# Patient Record
Sex: Male | Born: 1950 | Race: White | Hispanic: No | Smoking: Light tobacco smoker
Health system: Southern US, Community
[De-identification: ages and names within clinical notes are randomized; demographics above are authoritative.]

## PROBLEM LIST (undated history)

## (undated) DIAGNOSIS — J9601 Acute respiratory failure with hypoxia: Secondary | ICD-10-CM

## (undated) DIAGNOSIS — I6529 Occlusion and stenosis of unspecified carotid artery: Secondary | ICD-10-CM

## (undated) DIAGNOSIS — I10 Essential (primary) hypertension: Secondary | ICD-10-CM

## (undated) HISTORY — DX: Occlusion and stenosis of unspecified carotid artery: I65.29

## (undated) HISTORY — DX: Essential (primary) hypertension: I10

---
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
## 2013-11-27 ENCOUNTER — Telehealth: Payer: Self-pay | Admitting: *Deleted

## 2013-11-27 NOTE — Telephone Encounter (Signed)
Pt was calling in regards to going to his dentis this morning and he was unable to get his tooth extracted because his BP was high. He is out of his Metoprolol and he needs to have it refilled. His next appointment is June 2nd.   JB  Dentist Felipa Furnace [facility] Dr. Ginette Otto) he didn't know the number (open back up at 2:00)

## 2013-11-27 NOTE — Telephone Encounter (Signed)
Returned call and pt verified x 2.  Pt informed message received and we cannot refill any meds for him.  Informed it has >2 years since he was seen and we have no records on him in the office.  Pt advised to go to PCP or Urgent Care for evaluation and script.  Informed no refills can be given until he is seen in the office.  Pt verbalized understanding and agreed w/ plan.

## 2013-11-28 ENCOUNTER — Emergency Department (HOSPITAL_COMMUNITY)
Admission: EM | Admit: 2013-11-28 | Discharge: 2013-11-28 | Disposition: A | Discharge status: Home / Self Care | Payer: No Typology Code available for payment source | Source: Home / Self Care | Attending: Family Medicine | Admitting: Family Medicine

## 2013-11-28 ENCOUNTER — Encounter (HOSPITAL_COMMUNITY): Payer: Self-pay | Admitting: Emergency Medicine

## 2013-11-28 DIAGNOSIS — I10 Essential (primary) hypertension: Secondary | ICD-10-CM

## 2013-11-28 LAB — POCT I-STAT, CHEM 8
BUN: 12 mg/dL (ref 6–23)
CHLORIDE: 102 meq/L (ref 96–112)
Calcium, Ion: 1.22 mmol/L (ref 1.13–1.30)
Creatinine, Ser: 1.2 mg/dL (ref 0.50–1.35)
GLUCOSE: 82 mg/dL (ref 70–99)
HCT: 58 % — ABNORMAL HIGH (ref 39.0–52.0)
HEMOGLOBIN: 19.7 g/dL — AB (ref 13.0–17.0)
POTASSIUM: 4.2 meq/L (ref 3.7–5.3)
Sodium: 138 mEq/L (ref 137–147)
TCO2: 27 mmol/L (ref 0–100)

## 2013-11-28 MED ORDER — LISINOPRIL 20 MG PO TABS: 20.0000 mg | ORAL_TABLET | Freq: Every day | ORAL | Status: AC

## 2013-11-28 NOTE — ED Provider Notes (Signed)
CSN: [id number]     Arrival date & time 11/28/13  1305 History   First MD Initiated Contact with Patient 11/28/13 1404     Chief Complaint  Patient presents with  . Hypertension   (Consider location/radiation/quality/duration/timing/severity/associated sxs/prior Treatment) HPI Comments: 63 year old male presents complaining of high blood pressure. He was going to get his tooth for the dentist and he was found to have a blood pressure with a systolic over 200 and diastolic over 100. At home, his blood pressure readings are regularly 170-180/90-100. He used to be on blood pressure medicine but stopped it. He denies any symptoms at this time. He does not have a primary care doctor.  Patient is a 63 y.o. male presenting with hypertension.  Hypertension Pertinent negatives include no chest pain, no abdominal pain and no shortness of breath.    History reviewed. No pertinent past medical history. History reviewed. No pertinent past surgical history. History reviewed. No pertinent family history. History  Substance Use Topics  . Smoking status: Current Every Day Smoker -- 1.00 packs/day    Types: Cigarettes  . Smokeless tobacco: Not on file  . Alcohol Use: Yes    Review of Systems  Constitutional: Negative for fever, chills and fatigue.  HENT: Negative for sore throat.   Eyes: Negative for visual disturbance.  Respiratory: Negative for cough and shortness of breath.   Cardiovascular: Negative for chest pain, palpitations and leg swelling.  Gastrointestinal: Negative for nausea, vomiting, abdominal pain, diarrhea and constipation.  Genitourinary: Negative for dysuria, urgency, frequency and hematuria.  Musculoskeletal: Negative for arthralgias, myalgias, neck pain and neck stiffness.  Skin: Negative for rash.  Neurological: Negative for dizziness, weakness and light-headedness.  All other systems reviewed and are negative.   Allergies  Review of patient's allergies indicates no known  allergies.  Home Medications   Prior to Admission medications   Not on File   BP 178/98  Pulse 66  Temp(Src) 98.1 F (36.7 C) (Oral)  Resp 18  SpO2 99% Physical Exam  Nursing note and vitals reviewed. Constitutional: He is oriented to person, place, and time. He appears well-developed and well-nourished. No distress.  HENT:  Head: Normocephalic.  Cardiovascular: Normal rate, regular rhythm and normal heart sounds.   Pulmonary/Chest: Effort normal and breath sounds normal. No respiratory distress.  Neurological: He is alert and oriented to person, place, and time. Coordination normal.  Skin: Skin is warm and dry. No rash noted. He is not diaphoretic.  Psychiatric: He has a normal mood and affect. Judgment normal.    ED Course  Procedures (including critical care time) Labs Review Labs Reviewed  POCT I-STAT, CHEM 8 - Abnormal; Notable for the following:    Hemoglobin 19.7 (*)    HCT 58.0 (*)    All other components within normal limits    Imaging Review No results found.   MDM   1. Hypertension    Will start Tx with lisinopril, he will f/u with PCP.  Stop smoking.     Meds ordered this encounter  Medications  . lisinopril (PRINIVIL,ZESTRIL) 20 MG tablet    Sig: Take 1 tablet (20 mg total) by mouth daily.    Dispense:  30 tablet    Refill:  0    Order Specific Question:  Supervising Provider    Answer:  Clementeen Graham, Kathie Rhodes [[id number]]     Graylon Good, PA-C 11/28/13 [phone]

## 2013-11-28 NOTE — ED Notes (Signed)
Reports being sent here by dentist because BP was to high.   Pt states that he has a history of hypertension and high cholesterol but stopped taking meds a couple years ago.  Denies any symptoms.

## 2013-11-28 NOTE — Discharge Instructions (Signed)

## 2013-11-29 NOTE — ED Provider Notes (Signed)
Medical screening examination/treatment/procedure(s) were performed by a resident physician or non-physician practitioner and as the supervising physician I was immediately available for consultation/collaboration.  Carman Auxier, MD    Euclid Cassetta S Clarivel Callaway, MD 11/29/13 0752 

## 2013-12-24 ENCOUNTER — Ambulatory Visit: Payer: Self-pay | Admitting: Cardiovascular Disease

## 2014-11-18 ENCOUNTER — Other Ambulatory Visit: Payer: Self-pay | Admitting: Family Medicine

## 2014-11-18 DIAGNOSIS — Z136 Encounter for screening for cardiovascular disorders: Secondary | ICD-10-CM

## 2014-11-18 DIAGNOSIS — F17200 Nicotine dependence, unspecified, uncomplicated: Secondary | ICD-10-CM

## 2014-11-27 ENCOUNTER — Ambulatory Visit
Admission: RE | Admit: 2014-11-27 | Discharge: 2014-11-27 | Disposition: A | Discharge status: Home / Self Care | Payer: PPO | Source: Ambulatory Visit | Attending: Family Medicine | Admitting: Family Medicine

## 2014-11-27 DIAGNOSIS — F17200 Nicotine dependence, unspecified, uncomplicated: Secondary | ICD-10-CM

## 2014-11-27 DIAGNOSIS — Z136 Encounter for screening for cardiovascular disorders: Secondary | ICD-10-CM

## 2015-09-28 DIAGNOSIS — H25012 Cortical age-related cataract, left eye: Secondary | ICD-10-CM | POA: Diagnosis not present

## 2015-09-28 DIAGNOSIS — Z961 Presence of intraocular lens: Secondary | ICD-10-CM | POA: Diagnosis not present

## 2015-09-28 DIAGNOSIS — H2512 Age-related nuclear cataract, left eye: Secondary | ICD-10-CM | POA: Diagnosis not present

## 2015-09-28 DIAGNOSIS — H25042 Posterior subcapsular polar age-related cataract, left eye: Secondary | ICD-10-CM | POA: Diagnosis not present

## 2015-10-19 DIAGNOSIS — H26491 Other secondary cataract, right eye: Secondary | ICD-10-CM | POA: Diagnosis not present

## 2015-11-03 DIAGNOSIS — H2512 Age-related nuclear cataract, left eye: Secondary | ICD-10-CM | POA: Diagnosis not present

## 2015-11-24 DIAGNOSIS — I10 Essential (primary) hypertension: Secondary | ICD-10-CM | POA: Diagnosis not present

## 2015-11-24 DIAGNOSIS — Z23 Encounter for immunization: Secondary | ICD-10-CM | POA: Diagnosis not present

## 2016-05-26 DIAGNOSIS — Z0000 Encounter for general adult medical examination without abnormal findings: Secondary | ICD-10-CM | POA: Diagnosis not present

## 2016-05-26 DIAGNOSIS — Z1211 Encounter for screening for malignant neoplasm of colon: Secondary | ICD-10-CM | POA: Diagnosis not present

## 2016-05-26 DIAGNOSIS — R350 Frequency of micturition: Secondary | ICD-10-CM | POA: Diagnosis not present

## 2016-05-26 DIAGNOSIS — I10 Essential (primary) hypertension: Secondary | ICD-10-CM | POA: Diagnosis not present

## 2016-05-26 DIAGNOSIS — Z125 Encounter for screening for malignant neoplasm of prostate: Secondary | ICD-10-CM | POA: Diagnosis not present

## 2016-05-26 DIAGNOSIS — Z23 Encounter for immunization: Secondary | ICD-10-CM | POA: Diagnosis not present

## 2016-05-26 DIAGNOSIS — E7800 Pure hypercholesterolemia, unspecified: Secondary | ICD-10-CM | POA: Diagnosis not present

## 2016-06-22 DIAGNOSIS — Z1211 Encounter for screening for malignant neoplasm of colon: Secondary | ICD-10-CM | POA: Diagnosis not present

## 2016-06-23 DIAGNOSIS — H43812 Vitreous degeneration, left eye: Secondary | ICD-10-CM | POA: Diagnosis not present

## 2016-06-23 DIAGNOSIS — H524 Presbyopia: Secondary | ICD-10-CM | POA: Diagnosis not present

## 2016-06-23 DIAGNOSIS — H26492 Other secondary cataract, left eye: Secondary | ICD-10-CM | POA: Diagnosis not present

## 2016-06-23 DIAGNOSIS — Z961 Presence of intraocular lens: Secondary | ICD-10-CM | POA: Diagnosis not present

## 2016-06-28 DIAGNOSIS — E871 Hypo-osmolality and hyponatremia: Secondary | ICD-10-CM | POA: Diagnosis not present

## 2016-11-23 DIAGNOSIS — I10 Essential (primary) hypertension: Secondary | ICD-10-CM | POA: Diagnosis not present

## 2017-06-01 DIAGNOSIS — Z1211 Encounter for screening for malignant neoplasm of colon: Secondary | ICD-10-CM | POA: Diagnosis not present

## 2017-06-01 DIAGNOSIS — Z0000 Encounter for general adult medical examination without abnormal findings: Secondary | ICD-10-CM | POA: Diagnosis not present

## 2017-06-01 DIAGNOSIS — Z125 Encounter for screening for malignant neoplasm of prostate: Secondary | ICD-10-CM | POA: Diagnosis not present

## 2017-06-01 DIAGNOSIS — E7800 Pure hypercholesterolemia, unspecified: Secondary | ICD-10-CM | POA: Diagnosis not present

## 2017-06-01 DIAGNOSIS — Z23 Encounter for immunization: Secondary | ICD-10-CM | POA: Diagnosis not present

## 2017-06-01 DIAGNOSIS — I10 Essential (primary) hypertension: Secondary | ICD-10-CM | POA: Diagnosis not present

## 2017-06-28 DIAGNOSIS — H43812 Vitreous degeneration, left eye: Secondary | ICD-10-CM | POA: Diagnosis not present

## 2017-06-28 DIAGNOSIS — H26492 Other secondary cataract, left eye: Secondary | ICD-10-CM | POA: Diagnosis not present

## 2017-06-28 DIAGNOSIS — H524 Presbyopia: Secondary | ICD-10-CM | POA: Diagnosis not present

## 2017-06-28 DIAGNOSIS — Z961 Presence of intraocular lens: Secondary | ICD-10-CM | POA: Diagnosis not present

## 2017-11-21 ENCOUNTER — Other Ambulatory Visit: Payer: Self-pay | Admitting: Family Medicine

## 2017-11-21 DIAGNOSIS — R0600 Dyspnea, unspecified: Secondary | ICD-10-CM | POA: Diagnosis not present

## 2017-11-21 DIAGNOSIS — R0989 Other specified symptoms and signs involving the circulatory and respiratory systems: Secondary | ICD-10-CM

## 2017-11-23 ENCOUNTER — Ambulatory Visit
Admission: RE | Admit: 2017-11-23 | Discharge: 2017-11-23 | Disposition: A | Discharge status: Home / Self Care | Payer: PPO | Source: Ambulatory Visit | Attending: Family Medicine | Admitting: Family Medicine

## 2017-11-23 DIAGNOSIS — I6523 Occlusion and stenosis of bilateral carotid arteries: Secondary | ICD-10-CM | POA: Diagnosis not present

## 2017-11-23 DIAGNOSIS — R0989 Other specified symptoms and signs involving the circulatory and respiratory systems: Secondary | ICD-10-CM

## 2017-11-29 ENCOUNTER — Other Ambulatory Visit: Payer: Self-pay

## 2017-11-29 ENCOUNTER — Other Ambulatory Visit: Payer: Self-pay | Admitting: Family Medicine

## 2017-11-29 DIAGNOSIS — I10 Essential (primary) hypertension: Secondary | ICD-10-CM | POA: Diagnosis not present

## 2017-11-29 DIAGNOSIS — R0600 Dyspnea, unspecified: Secondary | ICD-10-CM | POA: Diagnosis not present

## 2017-11-29 DIAGNOSIS — R9439 Abnormal result of other cardiovascular function study: Secondary | ICD-10-CM

## 2017-11-29 DIAGNOSIS — I679 Cerebrovascular disease, unspecified: Secondary | ICD-10-CM | POA: Diagnosis not present

## 2017-11-29 DIAGNOSIS — E7800 Pure hypercholesterolemia, unspecified: Secondary | ICD-10-CM | POA: Diagnosis not present

## 2017-12-05 ENCOUNTER — Ambulatory Visit
Admission: RE | Admit: 2017-12-05 | Discharge: 2017-12-05 | Disposition: A | Discharge status: Home / Self Care | Payer: PPO | Source: Ambulatory Visit | Attending: Family Medicine | Admitting: Family Medicine

## 2017-12-05 DIAGNOSIS — R9439 Abnormal result of other cardiovascular function study: Secondary | ICD-10-CM

## 2017-12-05 DIAGNOSIS — I771 Stricture of artery: Secondary | ICD-10-CM | POA: Diagnosis not present

## 2017-12-05 MED ORDER — IOPAMIDOL (ISOVUE-370) INJECTION 76%
75.0000 mL | Freq: Once | INTRAVENOUS | Status: AC | PRN
  Administered 2017-12-05: 75 mL via INTRAVENOUS

## 2018-01-16 ENCOUNTER — Ambulatory Visit (INDEPENDENT_AMBULATORY_CARE_PROVIDER_SITE_OTHER): Payer: PPO | Admitting: Vascular Surgery

## 2018-01-16 ENCOUNTER — Encounter: Payer: Self-pay | Admitting: Vascular Surgery

## 2018-01-16 VITALS — BP 140/78 | HR 76 | Temp 96.9°F | Ht 66.0 in | Wt 156.6 lb

## 2018-01-16 DIAGNOSIS — I771 Stricture of artery: Secondary | ICD-10-CM | POA: Diagnosis not present

## 2018-01-16 NOTE — Progress Notes (Signed)
Vascular and Vein Specialist of [facility]  Patient name: Raymond Chavez MRN: [id number] DOB: 01-Aug-1950 Sex: male  REASON FOR CONSULT: Discuss CT angiogram findings of left subclavian stenosis and left vertebral narrowing  HPI: Raymond Chavez is a 67 y.o. male, who is here today for evaluation.  He is here today with his daughter.  He reportedly had difficulty with chest pain and evaluation included CT angiogram which revealed left subclavian stenosis.  He is here for further discussion of this.  Also underwent carotid duplex on 11/23/2017 and I have this for review as well.  He is right-handed.  He denies any exercise ischemia of his left arm.  No prior history of stroke or amaurosis fugax or vertebrobasilar symptoms.  He did have myocardial infarction at a very young age 79 and had coronary stenting at that time  Past Medical History:  Diagnosis Date  . Carotid artery occlusion   . Hypertension     History reviewed. No pertinent family history.  SOCIAL HISTORY: Social History   Socioeconomic History  . Marital status: Divorced    Spouse name: Not on file  . Number of children: 2  . Years of education: Not on file  . Highest education level: Not on file  Occupational History  . Not on file  Social Needs  . Financial resource strain: Not on file  . Food insecurity:    Worry: Not on file    Inability: Not on file  . Transportation needs:    Medical: Not on file    Non-medical: Not on file  Tobacco Use  . Smoking status: Light Tobacco Smoker    Packs/day: 0.00    Years: 40.00    Pack years: 0.00    Types: Cigarettes    Last attempt to quit: 10/23/2017    Years since quitting: 0.2  . Smokeless tobacco: Never Used  Substance and Sexual Activity  . Alcohol use: Yes    Alcohol/week: 3.6 - 7.2 oz    Types: 6 - 12 Cans of beer per week  . Drug use: No  . Sexual activity: Yes  Lifestyle  . Physical activity:    Days per week: Not on file   Minutes per session: Not on file  . Stress: Not on file  Relationships  . Social connections:    Talks on phone: Not on file    Gets together: Not on file    Attends religious service: Not on file    Active member of club or organization: Not on file    Attends meetings of clubs or organizations: Not on file    Relationship status: Not on file  . Intimate partner violence:    Fear of current or ex partner: Not on file    Emotionally abused: Not on file    Physically abused: Not on file    Forced sexual activity: Not on file  Other Topics Concern  . Not on file  Social History Narrative  . Not on file    No Known Allergies  Current Outpatient Medications  Medication Sig Dispense Refill  . albuterol (PROAIR HFA) 108 (90 Base) MCG/ACT inhaler Inhale into the lungs every 4 (four) hours as needed for wheezing or shortness of breath.    Marland Kitchen amLODipine (NORVASC) 5 MG tablet Take 5 mg by mouth daily.  3  . aspirin EC 81 MG tablet Take 81 mg by mouth daily.    . hydrochlorothiazide (HYDRODIURIL) 12.5 MG tablet Take 12.5 mg by mouth  daily.    . lisinopril (PRINIVIL,ZESTRIL) 20 MG tablet Take 1 tablet (20 mg total) by mouth daily. (Patient not taking: Reported on 01/16/2018) 30 tablet 0   No current facility-administered medications for this visit.     REVIEW OF SYSTEMS:  [X]  denotes positive finding, [ ]  denotes negative finding Cardiac  Comments:  Chest pain or chest pressure:    Shortness of breath upon exertion: x   Short of breath when lying flat:    Irregular heart rhythm:        Vascular    Pain in calf, thigh, or hip brought on by ambulation:    Pain in feet at night that wakes you up from your sleep:     Blood clot in your veins:    Leg swelling:         Pulmonary    Oxygen at home:    Productive cough:     Wheezing:         Neurologic    Sudden weakness in arms or legs:     Sudden numbness in arms or legs:     Sudden onset of difficulty speaking or slurred speech:     Temporary loss of vision in one eye:     Problems with dizziness:         Gastrointestinal    Blood in stool:     Vomited blood:         Genitourinary    Burning when urinating:     Blood in urine:        Psychiatric    Major depression:         Hematologic    Bleeding problems:    Problems with blood clotting too easily:        Skin    Rashes or ulcers:        Constitutional    Fever or chills:      PHYSICAL EXAM: Vitals:   01/16/18 1249  BP: 140/78  Pulse: 76  Temp: (!) 96.9 F (36.1 C)  TempSrc: Oral  SpO2: 98%  Weight: 156 lb 9.6 oz (71 kg)  Height: 5\' 6"  (1.676 m)    GENERAL: The patient is a well-nourished male, in no acute distress. The vital signs are documented above. CARDIOVASCULAR: I do not appreciate carotid bruits on exam.  He does have 2+ radial pulses bilaterally. PULMONARY: There is good air exchange  ABDOMEN: Soft and non-tender  MUSCULOSKELETAL: There are no major deformities or cyanosis. NEUROLOGIC: No focal weakness or paresthesias are detected. SKIN: There are no ulcers or rashes noted. PSYCHIATRIC: The patient has a normal affect.  DATA:  Duplex shows no significant carotid stenosis with possible innominate stenosis.  CT angiogram reveals moderate stenosis of his proximal left subclavian artery proximal to the vertebral artery takeoff.  He has a diffusely small left vertebral artery and has a very dominant large right vertebral artery going into the basilar artery  MEDICAL ISSUES: Discussed these findings at length with the patient and his daughter present.  I do not see any evidence of symptoms related to his moderate left subclavian stenosis.  I would not recommend any further evaluation and no follow-up with this asymptomatic finding.  I did explain that this and his coronary disease are related to cigarette smoking to a great extent and encouraged him to continue his attempts at cigarette cessation   Larina Earthly, MD [facility] Vascular  and Ve[facility] Specialists of [facility] Tel [phone] Pager [phone])  [phone]    

## 2018-06-05 DIAGNOSIS — Z23 Encounter for immunization: Secondary | ICD-10-CM | POA: Diagnosis not present

## 2018-06-05 DIAGNOSIS — Z1211 Encounter for screening for malignant neoplasm of colon: Secondary | ICD-10-CM | POA: Diagnosis not present

## 2018-06-05 DIAGNOSIS — I10 Essential (primary) hypertension: Secondary | ICD-10-CM | POA: Diagnosis not present

## 2018-06-05 DIAGNOSIS — Z0000 Encounter for general adult medical examination without abnormal findings: Secondary | ICD-10-CM | POA: Diagnosis not present

## 2018-06-05 DIAGNOSIS — E7800 Pure hypercholesterolemia, unspecified: Secondary | ICD-10-CM | POA: Diagnosis not present

## 2018-06-05 DIAGNOSIS — E871 Hypo-osmolality and hyponatremia: Secondary | ICD-10-CM | POA: Diagnosis not present

## 2018-06-05 DIAGNOSIS — I679 Cerebrovascular disease, unspecified: Secondary | ICD-10-CM | POA: Diagnosis not present

## 2018-06-05 DIAGNOSIS — Z125 Encounter for screening for malignant neoplasm of prostate: Secondary | ICD-10-CM | POA: Diagnosis not present

## 2018-07-26 DIAGNOSIS — Z961 Presence of intraocular lens: Secondary | ICD-10-CM | POA: Diagnosis not present

## 2018-07-26 DIAGNOSIS — H26492 Other secondary cataract, left eye: Secondary | ICD-10-CM | POA: Diagnosis not present

## 2018-07-26 DIAGNOSIS — H43812 Vitreous degeneration, left eye: Secondary | ICD-10-CM | POA: Diagnosis not present

## 2018-09-18 ENCOUNTER — Other Ambulatory Visit: Payer: Self-pay | Admitting: Family Medicine

## 2018-09-18 ENCOUNTER — Ambulatory Visit
Admission: RE | Admit: 2018-09-18 | Discharge: 2018-09-18 | Disposition: A | Discharge status: Home / Self Care | Payer: PPO | Source: Ambulatory Visit | Attending: Family Medicine | Admitting: Family Medicine

## 2018-09-18 DIAGNOSIS — R0609 Other forms of dyspnea: Principal | ICD-10-CM

## 2018-12-12 DIAGNOSIS — I679 Cerebrovascular disease, unspecified: Secondary | ICD-10-CM | POA: Diagnosis not present

## 2018-12-12 DIAGNOSIS — I10 Essential (primary) hypertension: Secondary | ICD-10-CM | POA: Diagnosis not present

## 2018-12-12 DIAGNOSIS — E7800 Pure hypercholesterolemia, unspecified: Secondary | ICD-10-CM | POA: Diagnosis not present

## 2018-12-18 DIAGNOSIS — E7800 Pure hypercholesterolemia, unspecified: Secondary | ICD-10-CM | POA: Diagnosis not present

## 2019-03-28 DIAGNOSIS — F17200 Nicotine dependence, unspecified, uncomplicated: Secondary | ICD-10-CM | POA: Diagnosis not present

## 2019-03-28 DIAGNOSIS — J309 Allergic rhinitis, unspecified: Secondary | ICD-10-CM | POA: Diagnosis not present

## 2019-03-28 DIAGNOSIS — J449 Chronic obstructive pulmonary disease, unspecified: Secondary | ICD-10-CM | POA: Diagnosis not present

## 2019-04-25 DIAGNOSIS — J9601 Acute respiratory failure with hypoxia: Secondary | ICD-10-CM

## 2019-04-25 HISTORY — DX: Acute respiratory failure with hypoxia: J96.01

## 2019-05-15 DIAGNOSIS — F17200 Nicotine dependence, unspecified, uncomplicated: Secondary | ICD-10-CM | POA: Diagnosis not present

## 2019-05-15 DIAGNOSIS — J449 Chronic obstructive pulmonary disease, unspecified: Secondary | ICD-10-CM | POA: Diagnosis not present

## 2019-05-16 ENCOUNTER — Emergency Department (HOSPITAL_COMMUNITY): Payer: PPO

## 2019-05-16 ENCOUNTER — Inpatient Hospital Stay: Payer: Self-pay

## 2019-05-16 ENCOUNTER — Other Ambulatory Visit: Payer: Self-pay

## 2019-05-16 ENCOUNTER — Inpatient Hospital Stay (HOSPITAL_COMMUNITY)
Admission: EM | Admit: 2019-05-16 | Discharge: 2019-06-25 | DRG: 001 | Disposition: E | Payer: PPO | Attending: Cardiothoracic Surgery | Admitting: Cardiothoracic Surgery

## 2019-05-16 ENCOUNTER — Encounter (HOSPITAL_COMMUNITY): Admission: EM | Disposition: E | Payer: Self-pay | Source: Home / Self Care | Attending: Cardiothoracic Surgery

## 2019-05-16 DIAGNOSIS — R0489 Hemorrhage from other sites in respiratory passages: Secondary | ICD-10-CM | POA: Diagnosis not present

## 2019-05-16 DIAGNOSIS — N281 Cyst of kidney, acquired: Secondary | ICD-10-CM | POA: Diagnosis not present

## 2019-05-16 DIAGNOSIS — Z951 Presence of aortocoronary bypass graft: Secondary | ICD-10-CM | POA: Diagnosis not present

## 2019-05-16 DIAGNOSIS — I5082 Biventricular heart failure: Secondary | ICD-10-CM | POA: Diagnosis not present

## 2019-05-16 DIAGNOSIS — J189 Pneumonia, unspecified organism: Secondary | ICD-10-CM | POA: Insufficient documentation

## 2019-05-16 DIAGNOSIS — K560 Paralytic ileus: Secondary | ICD-10-CM | POA: Diagnosis not present

## 2019-05-16 DIAGNOSIS — Z01818 Encounter for other preprocedural examination: Secondary | ICD-10-CM

## 2019-05-16 DIAGNOSIS — R140 Abdominal distension (gaseous): Secondary | ICD-10-CM | POA: Diagnosis not present

## 2019-05-16 DIAGNOSIS — N179 Acute kidney failure, unspecified: Secondary | ICD-10-CM | POA: Diagnosis not present

## 2019-05-16 DIAGNOSIS — K922 Gastrointestinal hemorrhage, unspecified: Secondary | ICD-10-CM | POA: Diagnosis not present

## 2019-05-16 DIAGNOSIS — I5021 Acute systolic (congestive) heart failure: Secondary | ICD-10-CM | POA: Diagnosis not present

## 2019-05-16 DIAGNOSIS — I472 Ventricular tachycardia: Secondary | ICD-10-CM | POA: Diagnosis not present

## 2019-05-16 DIAGNOSIS — Z4682 Encounter for fitting and adjustment of non-vascular catheter: Secondary | ICD-10-CM | POA: Diagnosis not present

## 2019-05-16 DIAGNOSIS — Z515 Encounter for palliative care: Secondary | ICD-10-CM | POA: Diagnosis not present

## 2019-05-16 DIAGNOSIS — I970 Postcardiotomy syndrome: Secondary | ICD-10-CM | POA: Diagnosis not present

## 2019-05-16 DIAGNOSIS — I130 Hypertensive heart and chronic kidney disease with heart failure and stage 1 through stage 4 chronic kidney disease, or unspecified chronic kidney disease: Secondary | ICD-10-CM | POA: Diagnosis present

## 2019-05-16 DIAGNOSIS — J810 Acute pulmonary edema: Secondary | ICD-10-CM

## 2019-05-16 DIAGNOSIS — T508X5A Adverse effect of diagnostic agents, initial encounter: Secondary | ICD-10-CM | POA: Diagnosis not present

## 2019-05-16 DIAGNOSIS — R1110 Vomiting, unspecified: Secondary | ICD-10-CM | POA: Diagnosis not present

## 2019-05-16 DIAGNOSIS — T17890A Other foreign object in other parts of respiratory tract causing asphyxiation, initial encounter: Secondary | ICD-10-CM | POA: Diagnosis not present

## 2019-05-16 DIAGNOSIS — R7989 Other specified abnormal findings of blood chemistry: Secondary | ICD-10-CM | POA: Diagnosis not present

## 2019-05-16 DIAGNOSIS — Z978 Presence of other specified devices: Secondary | ICD-10-CM | POA: Diagnosis not present

## 2019-05-16 DIAGNOSIS — I255 Ischemic cardiomyopathy: Secondary | ICD-10-CM | POA: Diagnosis present

## 2019-05-16 DIAGNOSIS — I4891 Unspecified atrial fibrillation: Secondary | ICD-10-CM | POA: Diagnosis not present

## 2019-05-16 DIAGNOSIS — R042 Hemoptysis: Secondary | ICD-10-CM | POA: Diagnosis not present

## 2019-05-16 DIAGNOSIS — D619 Aplastic anemia, unspecified: Secondary | ICD-10-CM | POA: Diagnosis not present

## 2019-05-16 DIAGNOSIS — R0600 Dyspnea, unspecified: Secondary | ICD-10-CM

## 2019-05-16 DIAGNOSIS — J9602 Acute respiratory failure with hypercapnia: Secondary | ICD-10-CM | POA: Diagnosis not present

## 2019-05-16 DIAGNOSIS — D649 Anemia, unspecified: Secondary | ICD-10-CM | POA: Diagnosis not present

## 2019-05-16 DIAGNOSIS — J158 Pneumonia due to other specified bacteria: Secondary | ICD-10-CM | POA: Diagnosis present

## 2019-05-16 DIAGNOSIS — I351 Nonrheumatic aortic (valve) insufficiency: Secondary | ICD-10-CM | POA: Diagnosis not present

## 2019-05-16 DIAGNOSIS — K921 Melena: Secondary | ICD-10-CM | POA: Diagnosis not present

## 2019-05-16 DIAGNOSIS — R0602 Shortness of breath: Secondary | ICD-10-CM | POA: Diagnosis not present

## 2019-05-16 DIAGNOSIS — I252 Old myocardial infarction: Secondary | ICD-10-CM

## 2019-05-16 DIAGNOSIS — R791 Abnormal coagulation profile: Secondary | ICD-10-CM | POA: Diagnosis not present

## 2019-05-16 DIAGNOSIS — R270 Ataxia, unspecified: Secondary | ICD-10-CM | POA: Diagnosis not present

## 2019-05-16 DIAGNOSIS — Z20828 Contact with and (suspected) exposure to other viral communicable diseases: Secondary | ICD-10-CM | POA: Diagnosis not present

## 2019-05-16 DIAGNOSIS — I214 Non-ST elevation (NSTEMI) myocardial infarction: Secondary | ICD-10-CM | POA: Diagnosis not present

## 2019-05-16 DIAGNOSIS — Z9911 Dependence on respirator [ventilator] status: Secondary | ICD-10-CM

## 2019-05-16 DIAGNOSIS — I509 Heart failure, unspecified: Secondary | ICD-10-CM | POA: Diagnosis not present

## 2019-05-16 DIAGNOSIS — I272 Pulmonary hypertension, unspecified: Secondary | ICD-10-CM | POA: Diagnosis present

## 2019-05-16 DIAGNOSIS — R570 Cardiogenic shock: Secondary | ICD-10-CM

## 2019-05-16 DIAGNOSIS — Z66 Do not resuscitate: Secondary | ICD-10-CM | POA: Diagnosis not present

## 2019-05-16 DIAGNOSIS — J690 Pneumonitis due to inhalation of food and vomit: Secondary | ICD-10-CM | POA: Diagnosis not present

## 2019-05-16 DIAGNOSIS — R778 Other specified abnormalities of plasma proteins: Secondary | ICD-10-CM

## 2019-05-16 DIAGNOSIS — E872 Acidosis: Secondary | ICD-10-CM | POA: Diagnosis not present

## 2019-05-16 DIAGNOSIS — D696 Thrombocytopenia, unspecified: Secondary | ICD-10-CM | POA: Diagnosis not present

## 2019-05-16 DIAGNOSIS — I513 Intracardiac thrombosis, not elsewhere classified: Secondary | ICD-10-CM | POA: Diagnosis not present

## 2019-05-16 DIAGNOSIS — R590 Localized enlarged lymph nodes: Secondary | ICD-10-CM | POA: Diagnosis present

## 2019-05-16 DIAGNOSIS — I501 Left ventricular failure: Secondary | ICD-10-CM | POA: Diagnosis not present

## 2019-05-16 DIAGNOSIS — I5043 Acute on chronic combined systolic (congestive) and diastolic (congestive) heart failure: Secondary | ICD-10-CM

## 2019-05-16 DIAGNOSIS — I708 Atherosclerosis of other arteries: Secondary | ICD-10-CM | POA: Diagnosis present

## 2019-05-16 DIAGNOSIS — N19 Unspecified kidney failure: Secondary | ICD-10-CM | POA: Diagnosis not present

## 2019-05-16 DIAGNOSIS — R6521 Severe sepsis with septic shock: Secondary | ICD-10-CM | POA: Diagnosis not present

## 2019-05-16 DIAGNOSIS — A419 Sepsis, unspecified organism: Secondary | ICD-10-CM | POA: Diagnosis not present

## 2019-05-16 DIAGNOSIS — J80 Acute respiratory distress syndrome: Secondary | ICD-10-CM | POA: Diagnosis not present

## 2019-05-16 DIAGNOSIS — Z452 Encounter for adjustment and management of vascular access device: Secondary | ICD-10-CM

## 2019-05-16 DIAGNOSIS — Z7982 Long term (current) use of aspirin: Secondary | ICD-10-CM

## 2019-05-16 DIAGNOSIS — R319 Hematuria, unspecified: Secondary | ICD-10-CM | POA: Diagnosis not present

## 2019-05-16 DIAGNOSIS — J9690 Respiratory failure, unspecified, unspecified whether with hypoxia or hypercapnia: Secondary | ICD-10-CM

## 2019-05-16 DIAGNOSIS — E873 Alkalosis: Secondary | ICD-10-CM | POA: Diagnosis not present

## 2019-05-16 DIAGNOSIS — Z953 Presence of xenogenic heart valve: Secondary | ICD-10-CM

## 2019-05-16 DIAGNOSIS — Z9281 Personal history of extracorporeal membrane oxygenation (ECMO): Secondary | ICD-10-CM

## 2019-05-16 DIAGNOSIS — K7200 Acute and subacute hepatic failure without coma: Secondary | ICD-10-CM | POA: Diagnosis not present

## 2019-05-16 DIAGNOSIS — E876 Hypokalemia: Secondary | ICD-10-CM | POA: Diagnosis not present

## 2019-05-16 DIAGNOSIS — F17210 Nicotine dependence, cigarettes, uncomplicated: Secondary | ICD-10-CM | POA: Diagnosis present

## 2019-05-16 DIAGNOSIS — D72819 Decreased white blood cell count, unspecified: Secondary | ICD-10-CM | POA: Diagnosis not present

## 2019-05-16 DIAGNOSIS — Z23 Encounter for immunization: Secondary | ICD-10-CM | POA: Diagnosis not present

## 2019-05-16 DIAGNOSIS — J9601 Acute respiratory failure with hypoxia: Secondary | ICD-10-CM | POA: Diagnosis not present

## 2019-05-16 DIAGNOSIS — W44F3XA Food entering into or through a natural orifice, initial encounter: Secondary | ICD-10-CM

## 2019-05-16 DIAGNOSIS — N170 Acute kidney failure with tubular necrosis: Secondary | ICD-10-CM | POA: Diagnosis not present

## 2019-05-16 DIAGNOSIS — T17920A Food in respiratory tract, part unspecified causing asphyxiation, initial encounter: Secondary | ICD-10-CM

## 2019-05-16 DIAGNOSIS — Z95811 Presence of heart assist device: Secondary | ICD-10-CM | POA: Diagnosis not present

## 2019-05-16 DIAGNOSIS — D62 Acute posthemorrhagic anemia: Secondary | ICD-10-CM | POA: Diagnosis not present

## 2019-05-16 DIAGNOSIS — N141 Nephropathy induced by other drugs, medicaments and biological substances: Secondary | ICD-10-CM | POA: Diagnosis not present

## 2019-05-16 DIAGNOSIS — Z9689 Presence of other specified functional implants: Secondary | ICD-10-CM

## 2019-05-16 DIAGNOSIS — I358 Other nonrheumatic aortic valve disorders: Secondary | ICD-10-CM | POA: Diagnosis not present

## 2019-05-16 DIAGNOSIS — Z9889 Other specified postprocedural states: Secondary | ICD-10-CM

## 2019-05-16 DIAGNOSIS — R079 Chest pain, unspecified: Secondary | ICD-10-CM | POA: Diagnosis not present

## 2019-05-16 DIAGNOSIS — E871 Hypo-osmolality and hyponatremia: Secondary | ICD-10-CM | POA: Diagnosis not present

## 2019-05-16 DIAGNOSIS — J181 Lobar pneumonia, unspecified organism: Secondary | ICD-10-CM | POA: Diagnosis not present

## 2019-05-16 DIAGNOSIS — N183 Chronic kidney disease, stage 3 unspecified: Secondary | ICD-10-CM | POA: Diagnosis present

## 2019-05-16 DIAGNOSIS — J9600 Acute respiratory failure, unspecified whether with hypoxia or hypercapnia: Secondary | ICD-10-CM | POA: Diagnosis not present

## 2019-05-16 DIAGNOSIS — I5031 Acute diastolic (congestive) heart failure: Secondary | ICD-10-CM | POA: Diagnosis not present

## 2019-05-16 DIAGNOSIS — I2510 Atherosclerotic heart disease of native coronary artery without angina pectoris: Secondary | ICD-10-CM

## 2019-05-16 DIAGNOSIS — R000 Tachycardia, unspecified: Secondary | ICD-10-CM | POA: Diagnosis not present

## 2019-05-16 DIAGNOSIS — I081 Rheumatic disorders of both mitral and tricuspid valves: Secondary | ICD-10-CM | POA: Diagnosis not present

## 2019-05-16 DIAGNOSIS — G9349 Other encephalopathy: Secondary | ICD-10-CM | POA: Diagnosis not present

## 2019-05-16 DIAGNOSIS — J9811 Atelectasis: Secondary | ICD-10-CM | POA: Diagnosis not present

## 2019-05-16 DIAGNOSIS — I447 Left bundle-branch block, unspecified: Secondary | ICD-10-CM | POA: Diagnosis present

## 2019-05-16 DIAGNOSIS — E875 Hyperkalemia: Secondary | ICD-10-CM | POA: Diagnosis present

## 2019-05-16 DIAGNOSIS — Z79899 Other long term (current) drug therapy: Secondary | ICD-10-CM

## 2019-05-16 DIAGNOSIS — I25110 Atherosclerotic heart disease of native coronary artery with unstable angina pectoris: Secondary | ICD-10-CM | POA: Diagnosis not present

## 2019-05-16 DIAGNOSIS — I97130 Postprocedural heart failure following cardiac surgery: Secondary | ICD-10-CM

## 2019-05-16 DIAGNOSIS — I959 Hypotension, unspecified: Secondary | ICD-10-CM

## 2019-05-16 DIAGNOSIS — R609 Edema, unspecified: Secondary | ICD-10-CM | POA: Diagnosis not present

## 2019-05-16 DIAGNOSIS — Z419 Encounter for procedure for purposes other than remedying health state, unspecified: Secondary | ICD-10-CM

## 2019-05-16 DIAGNOSIS — J811 Chronic pulmonary edema: Secondary | ICD-10-CM | POA: Diagnosis not present

## 2019-05-16 DIAGNOSIS — I259 Chronic ischemic heart disease, unspecified: Secondary | ICD-10-CM | POA: Diagnosis not present

## 2019-05-16 DIAGNOSIS — L8990 Pressure ulcer of unspecified site, unspecified stage: Secondary | ICD-10-CM | POA: Insufficient documentation

## 2019-05-16 DIAGNOSIS — J90 Pleural effusion, not elsewhere classified: Secondary | ICD-10-CM | POA: Diagnosis not present

## 2019-05-16 DIAGNOSIS — A4181 Sepsis due to Enterococcus: Secondary | ICD-10-CM | POA: Diagnosis not present

## 2019-05-16 DIAGNOSIS — I340 Nonrheumatic mitral (valve) insufficiency: Secondary | ICD-10-CM | POA: Diagnosis not present

## 2019-05-16 DIAGNOSIS — R34 Anuria and oliguria: Secondary | ICD-10-CM | POA: Diagnosis not present

## 2019-05-16 DIAGNOSIS — R066 Hiccough: Secondary | ICD-10-CM | POA: Diagnosis not present

## 2019-05-16 DIAGNOSIS — Z4659 Encounter for fitting and adjustment of other gastrointestinal appliance and device: Secondary | ICD-10-CM

## 2019-05-16 DIAGNOSIS — R918 Other nonspecific abnormal finding of lung field: Secondary | ICD-10-CM | POA: Diagnosis not present

## 2019-05-16 DIAGNOSIS — I10 Essential (primary) hypertension: Secondary | ICD-10-CM | POA: Diagnosis not present

## 2019-05-16 DIAGNOSIS — I110 Hypertensive heart disease with heart failure: Secondary | ICD-10-CM | POA: Diagnosis not present

## 2019-05-16 DIAGNOSIS — K7290 Hepatic failure, unspecified without coma: Secondary | ICD-10-CM | POA: Diagnosis not present

## 2019-05-16 DIAGNOSIS — I462 Cardiac arrest due to underlying cardiac condition: Secondary | ICD-10-CM | POA: Diagnosis not present

## 2019-05-16 DIAGNOSIS — R0603 Acute respiratory distress: Secondary | ICD-10-CM | POA: Diagnosis not present

## 2019-05-16 DIAGNOSIS — Z22322 Carrier or suspected carrier of Methicillin resistant Staphylococcus aureus: Secondary | ICD-10-CM

## 2019-05-16 DIAGNOSIS — N7 Salpingitis and oophoritis: Secondary | ICD-10-CM | POA: Diagnosis not present

## 2019-05-16 LAB — POCT I-STAT 7, (LYTES, BLD GAS, ICA,H+H)
Acid-base deficit: 15 mmol/L — ABNORMAL HIGH (ref 0.0–2.0)
Bicarbonate: 14.2 mmol/L — ABNORMAL LOW (ref 20.0–28.0)
Calcium, Ion: 1.27 mmol/L (ref 1.15–1.40)
HCT: 58 % — ABNORMAL HIGH (ref 39.0–52.0)
Hemoglobin: 19.7 g/dL — ABNORMAL HIGH (ref 13.0–17.0)
O2 Saturation: 95 %
Patient temperature: 97.7
Potassium: 5.1 mmol/L (ref 3.5–5.1)
Sodium: 131 mmol/L — ABNORMAL LOW (ref 135–145)
TCO2: 16 mmol/L — ABNORMAL LOW (ref 22–32)
pCO2 arterial: 44.5 mmHg (ref 32.0–48.0)
pH, Arterial: 7.108 — CL (ref 7.350–7.450)
pO2, Arterial: 101 mmHg (ref 83.0–108.0)

## 2019-05-16 LAB — COMPREHENSIVE METABOLIC PANEL
ALT: 23 U/L (ref 0–44)
AST: 33 U/L (ref 15–41)
Albumin: 3.6 g/dL (ref 3.5–5.0)
Alkaline Phosphatase: 108 U/L (ref 38–126)
Anion gap: 15 (ref 5–15)
BUN: 16 mg/dL (ref 8–23)
CO2: 14 mmol/L — ABNORMAL LOW (ref 22–32)
Calcium: 9.4 mg/dL (ref 8.9–10.3)
Chloride: 102 mmol/L (ref 98–111)
Creatinine, Ser: 1.96 mg/dL — ABNORMAL HIGH (ref 0.61–1.24)
GFR calc Af Amer: 39 mL/min — ABNORMAL LOW (ref >60)
GFR calc non Af Amer: 34 mL/min — ABNORMAL LOW (ref >60)
Glucose, Bld: 345 mg/dL — ABNORMAL HIGH (ref 70–99)
Potassium: 4.3 mmol/L (ref 3.5–5.1)
Sodium: 131 mmol/L — ABNORMAL LOW (ref 135–145)
Total Bilirubin: 1.4 mg/dL — ABNORMAL HIGH (ref 0.3–1.2)
Total Protein: 7.8 g/dL (ref 6.5–8.1)

## 2019-05-16 LAB — CBC WITH DIFFERENTIAL/PLATELET
Abs Immature Granulocytes: 0.11 10*3/uL — ABNORMAL HIGH (ref 0.00–0.07)
Basophils Absolute: 0 10*3/uL (ref 0.0–0.1)
Basophils Relative: 0 %
Eosinophils Absolute: 0 10*3/uL (ref 0.0–0.5)
Eosinophils Relative: 0 %
HCT: 53.3 % — ABNORMAL HIGH (ref 39.0–52.0)
Hemoglobin: 17.2 g/dL — ABNORMAL HIGH (ref 13.0–17.0)
Immature Granulocytes: 1 %
Lymphocytes Relative: 10 %
Lymphs Abs: 1.9 10*3/uL (ref 0.7–4.0)
MCH: 30.4 pg (ref 26.0–34.0)
MCHC: 32.3 g/dL (ref 30.0–36.0)
MCV: 94.3 fL (ref 80.0–100.0)
Monocytes Absolute: 0.4 10*3/uL (ref 0.1–1.0)
Monocytes Relative: 2 %
Neutro Abs: 17.1 10*3/uL — ABNORMAL HIGH (ref 1.7–7.7)
Neutrophils Relative %: 87 %
Platelets: 213 10*3/uL (ref 150–400)
RBC: 5.65 MIL/uL (ref 4.22–5.81)
RDW: 13.5 % (ref 11.5–15.5)
WBC: 19.6 10*3/uL — ABNORMAL HIGH (ref 4.0–10.5)
nRBC: 0 % (ref 0.0–0.2)

## 2019-05-16 LAB — LIPID PANEL
Cholesterol: 115 mg/dL (ref 0–200)
HDL: 30 mg/dL — ABNORMAL LOW (ref >40)
LDL Cholesterol: 66 mg/dL (ref 0–99)
Total CHOL/HDL Ratio: 3.8 RATIO
Triglycerides: 93 mg/dL (ref <150)
VLDL: 19 mg/dL (ref 0–40)

## 2019-05-16 LAB — BASIC METABOLIC PANEL
Anion gap: 14 (ref 5–15)
BUN: 14 mg/dL (ref 8–23)
CO2: 15 mmol/L — ABNORMAL LOW (ref 22–32)
Calcium: 9.9 mg/dL (ref 8.9–10.3)
Chloride: 103 mmol/L (ref 98–111)
Creatinine, Ser: 1.34 mg/dL — ABNORMAL HIGH (ref 0.61–1.24)
GFR calc Af Amer: >60 mL/min (ref >60)
GFR calc non Af Amer: 54 mL/min — ABNORMAL LOW (ref >60)
Glucose, Bld: 149 mg/dL — ABNORMAL HIGH (ref 70–99)
Potassium: 4.2 mmol/L (ref 3.5–5.1)
Sodium: 132 mmol/L — ABNORMAL LOW (ref 135–145)

## 2019-05-16 LAB — CBC
HCT: 45 % (ref 39.0–52.0)
Hemoglobin: 15 g/dL (ref 13.0–17.0)
MCH: 29.9 pg (ref 26.0–34.0)
MCHC: 33.3 g/dL (ref 30.0–36.0)
MCV: 89.8 fL (ref 80.0–100.0)
Platelets: 200 10*3/uL (ref 150–400)
RBC: 5.01 MIL/uL (ref 4.22–5.81)
RDW: 13.5 % (ref 11.5–15.5)
WBC: 16.9 10*3/uL — ABNORMAL HIGH (ref 4.0–10.5)
nRBC: 0 % (ref 0.0–0.2)

## 2019-05-16 LAB — COOXEMETRY PANEL
Carboxyhemoglobin: 0.9 % (ref 0.5–1.5)
Methemoglobin: 1.1 % (ref 0.0–1.5)
O2 Saturation: 85.6 %
Total hemoglobin: 18 g/dL — ABNORMAL HIGH (ref 12.0–16.0)

## 2019-05-16 LAB — BRAIN NATRIURETIC PEPTIDE: B Natriuretic Peptide: 2725.3 pg/mL — ABNORMAL HIGH (ref 0.0–100.0)

## 2019-05-16 LAB — TROPONIN I (HIGH SENSITIVITY)
Troponin I (High Sensitivity): 168 ng/L (ref <18)
Troponin I (High Sensitivity): 712 ng/L (ref <18)
Troponin I (High Sensitivity): 1456 ng/L (ref <18)

## 2019-05-16 LAB — HIV ANTIBODY (ROUTINE TESTING W REFLEX): HIV Screen 4th Generation wRfx: NONREACTIVE

## 2019-05-16 LAB — HEMOGLOBIN A1C
Hgb A1c MFr Bld: 5.5 % (ref 4.8–5.6)
Mean Plasma Glucose: 111.15 mg/dL

## 2019-05-16 LAB — SARS CORONAVIRUS 2 BY RT PCR (HOSPITAL ORDER, PERFORMED IN ~~LOC~~ HOSPITAL LAB): SARS Coronavirus 2: NEGATIVE

## 2019-05-16 LAB — LACTIC ACID, PLASMA: Lactic Acid, Venous: 4.5 mmol/L (ref 0.5–1.9)

## 2019-05-16 SURGERY — CORONARY/GRAFT ACUTE MI REVASCULARIZATION
Anesthesia: LOCAL

## 2019-05-16 MED ORDER — IPRATROPIUM BROMIDE HFA 17 MCG/ACT IN AERS
8.0000 | INHALATION_SPRAY | Freq: Once | RESPIRATORY_TRACT | Status: AC
  Administered 2019-05-16: 17:00:00 8 via RESPIRATORY_TRACT
  Filled 2019-05-16: qty 12.9

## 2019-05-16 MED ORDER — ASPIRIN 81 MG PO CHEW
81.0000 mg | CHEWABLE_TABLET | Freq: Every day | ORAL | Status: DC
  Administered 2019-05-17 – 2019-06-04 (×17): 81 mg
  Filled 2019-05-16 (×17): qty 1

## 2019-05-16 MED ORDER — ALBUTEROL SULFATE HFA 108 (90 BASE) MCG/ACT IN AERS
INHALATION_SPRAY | RESPIRATORY_TRACT | Status: AC
  Administered 2019-05-16: 17:00:00
  Filled 2019-05-16: qty 6.7

## 2019-05-16 MED ORDER — FENTANYL 2500MCG IN NS 250ML (10MCG/ML) PREMIX INFUSION
25.0000 ug/h | INTRAVENOUS | Status: DC
  Administered 2019-05-16: 23:00:00 25 ug/h via INTRAVENOUS
  Administered 2019-05-17 (×2): 200 ug/h via INTRAVENOUS
  Administered 2019-05-18 – 2019-05-19 (×2): 100 ug/h via INTRAVENOUS
  Administered 2019-05-20: 50 ug/h via INTRAVENOUS
  Administered 2019-05-21: 25 ug/h via INTRAVENOUS
  Administered 2019-05-22: 250 ug/h via INTRAVENOUS
  Administered 2019-05-23: 300 ug/h via INTRAVENOUS
  Administered 2019-05-23: 250 ug/h via INTRAVENOUS
  Administered 2019-05-23: 375 ug/h via INTRAVENOUS
  Administered 2019-05-24: 400 ug/h via INTRAVENOUS
  Administered 2019-05-24: 02:00:00 275 ug/h via INTRAVENOUS
  Administered 2019-05-24: 12:00:00 300 ug/h via INTRAVENOUS
  Administered 2019-05-25: 400 ug/h via INTRAVENOUS
  Administered 2019-05-25: 300 ug/h via INTRAVENOUS
  Administered 2019-05-25 – 2019-05-26 (×2): 400 ug/h via INTRAVENOUS
  Filled 2019-05-16 (×19): qty 250

## 2019-05-16 MED ORDER — FENTANYL CITRATE (PF) 100 MCG/2ML IJ SOLN: 25.0000 ug | INTRAMUSCULAR | Status: DC | PRN

## 2019-05-16 MED ORDER — NITROGLYCERIN 0.4 MG SL SUBL: 0.4000 mg | SUBLINGUAL_TABLET | SUBLINGUAL | Status: DC | PRN

## 2019-05-16 MED ORDER — IOHEXOL 350 MG/ML SOLN
64.0000 mL | Freq: Once | INTRAVENOUS | Status: AC | PRN
  Administered 2019-05-16: 20:00:00 64 mL via INTRAVENOUS

## 2019-05-16 MED ORDER — ASPIRIN 81 MG PO CHEW
CHEWABLE_TABLET | ORAL | Status: AC
  Filled 2019-05-16: qty 4

## 2019-05-16 MED ORDER — INSULIN ASPART 100 UNIT/ML ~~LOC~~ SOLN
1.0000 [IU] | SUBCUTANEOUS | Status: DC
  Administered 2019-05-16: 3 [IU] via SUBCUTANEOUS
  Administered 2019-05-17: 05:00:00 1 [IU] via SUBCUTANEOUS
  Administered 2019-05-17 – 2019-05-18 (×6): 2 [IU] via SUBCUTANEOUS
  Administered 2019-05-18 – 2019-05-19 (×2): 1 [IU] via SUBCUTANEOUS
  Administered 2019-05-19: 05:00:00 2 [IU] via SUBCUTANEOUS
  Administered 2019-05-19: 16:00:00 1 [IU] via SUBCUTANEOUS
  Administered 2019-05-19 (×2): 2 [IU] via SUBCUTANEOUS
  Administered 2019-05-20 (×2): 1 [IU] via SUBCUTANEOUS
  Administered 2019-05-20: 05:00:00 2 [IU] via SUBCUTANEOUS
  Administered 2019-05-20: 11:00:00 1 [IU] via SUBCUTANEOUS
  Administered 2019-05-20: 2 [IU] via SUBCUTANEOUS
  Administered 2019-05-20 – 2019-05-21 (×2): 1 [IU] via SUBCUTANEOUS
  Administered 2019-05-21: 2 [IU] via SUBCUTANEOUS
  Administered 2019-05-21 – 2019-05-22 (×4): 1 [IU] via SUBCUTANEOUS
  Administered 2019-05-22: 2 [IU] via SUBCUTANEOUS
  Administered 2019-05-22 – 2019-05-23 (×3): 1 [IU] via SUBCUTANEOUS
  Administered 2019-05-23 (×3): 2 [IU] via SUBCUTANEOUS
  Administered 2019-05-23 – 2019-05-24 (×3): 1 [IU] via SUBCUTANEOUS
  Administered 2019-05-24 (×2): 2 [IU] via SUBCUTANEOUS
  Administered 2019-05-24: 12:00:00 1 [IU] via SUBCUTANEOUS
  Administered 2019-05-24 – 2019-05-25 (×3): 2 [IU] via SUBCUTANEOUS
  Administered 2019-05-25 (×2): 1 [IU] via SUBCUTANEOUS
  Administered 2019-05-25: 04:00:00 2 [IU] via SUBCUTANEOUS
  Administered 2019-05-26: 1 [IU] via SUBCUTANEOUS
  Administered 2019-05-26: 2 [IU] via SUBCUTANEOUS
  Administered 2019-05-26 – 2019-05-28 (×8): 1 [IU] via SUBCUTANEOUS
  Administered 2019-05-29 (×2): 2 [IU] via SUBCUTANEOUS
  Administered 2019-05-30: 1 [IU] via SUBCUTANEOUS
  Administered 2019-05-31: 2 [IU] via SUBCUTANEOUS
  Administered 2019-05-31: 1 [IU] via SUBCUTANEOUS
  Administered 2019-05-31 – 2019-06-01 (×6): 2 [IU] via SUBCUTANEOUS
  Administered 2019-06-01: 3 [IU] via SUBCUTANEOUS
  Administered 2019-06-01 (×2): 2 [IU] via SUBCUTANEOUS
  Administered 2019-06-02: 1 [IU] via SUBCUTANEOUS
  Administered 2019-06-02: 2 [IU] via SUBCUTANEOUS
  Administered 2019-06-02 (×2): 1 [IU] via SUBCUTANEOUS
  Administered 2019-06-02: 2 [IU] via SUBCUTANEOUS
  Administered 2019-06-02: 3 [IU] via SUBCUTANEOUS
  Administered 2019-06-03 (×3): 2 [IU] via SUBCUTANEOUS
  Administered 2019-06-03: 1 [IU] via SUBCUTANEOUS
  Administered 2019-06-04: 2 [IU] via SUBCUTANEOUS
  Administered 2019-06-04: 01:00:00 1 [IU] via SUBCUTANEOUS
  Administered 2019-06-04: 2 [IU] via SUBCUTANEOUS
  Administered 2019-06-04 – 2019-06-05 (×2): 1 [IU] via SUBCUTANEOUS

## 2019-05-16 MED ORDER — SODIUM CHLORIDE 0.9 % IV SOLN
2.0000 g | Freq: Two times a day (BID) | INTRAVENOUS | Status: DC
  Administered 2019-05-17 (×2): 2 g via INTRAVENOUS
  Filled 2019-05-16 (×3): qty 2

## 2019-05-16 MED ORDER — SODIUM CHLORIDE 0.9% FLUSH
10.0000 mL | Freq: Two times a day (BID) | INTRAVENOUS | Status: DC
  Administered 2019-05-16 – 2019-05-19 (×6): 10 mL
  Administered 2019-05-20: 30 mL
  Administered 2019-05-20 – 2019-05-25 (×4): 10 mL
  Administered 2019-05-25: 10:00:00 30 mL
  Administered 2019-05-26 (×2): 10 mL
  Administered 2019-05-29: 40 mL
  Administered 2019-05-30 – 2019-06-03 (×7): 10 mL

## 2019-05-16 MED ORDER — ROCURONIUM BROMIDE 50 MG/5ML IV SOLN
INTRAVENOUS | Status: AC | PRN
  Administered 2019-05-16: 100 mg via INTRAVENOUS

## 2019-05-16 MED ORDER — ASPIRIN 81 MG PO CHEW: 81.0000 mg | CHEWABLE_TABLET | Freq: Every day | ORAL | Status: DC

## 2019-05-16 MED ORDER — FENTANYL CITRATE (PF) 100 MCG/2ML IJ SOLN
25.0000 ug | Freq: Once | INTRAMUSCULAR | Status: AC
  Administered 2019-05-16: 23:00:00 25 ug via INTRAVENOUS

## 2019-05-16 MED ORDER — HEPARIN (PORCINE) 25000 UT/250ML-% IV SOLN
1100.0000 [IU]/h | INTRAVENOUS | Status: DC
  Administered 2019-05-16: 22:00:00 900 [IU]/h via INTRAVENOUS
  Filled 2019-05-16: qty 250

## 2019-05-16 MED ORDER — METHYLPREDNISOLONE SODIUM SUCC 125 MG IJ SOLR
125.0000 mg | Freq: Once | INTRAMUSCULAR | Status: AC
  Administered 2019-05-16: 125 mg via INTRAVENOUS
  Filled 2019-05-16: qty 2

## 2019-05-16 MED ORDER — PROPOFOL 1000 MG/100ML IV EMUL
INTRAVENOUS | Status: AC
  Filled 2019-05-16: qty 100

## 2019-05-16 MED ORDER — SODIUM BICARBONATE 8.4 % IV SOLN: 100.0000 meq | Freq: Once | INTRAVENOUS | Status: DC

## 2019-05-16 MED ORDER — GABAPENTIN 250 MG/5ML PO SOLN
100.0000 mg | Freq: Three times a day (TID) | ORAL | Status: DC
  Administered 2019-05-17 – 2019-05-18 (×2): 100 mg
  Filled 2019-05-16 (×6): qty 2

## 2019-05-16 MED ORDER — INSULIN ASPART 100 UNIT/ML ~~LOC~~ SOLN: 0.0000 [IU] | SUBCUTANEOUS | Status: DC

## 2019-05-16 MED ORDER — HEPARIN SODIUM (PORCINE) 5000 UNIT/ML IJ SOLN
INTRAMUSCULAR | Status: AC
  Filled 2019-05-16: qty 1

## 2019-05-16 MED ORDER — CHLORHEXIDINE GLUCONATE 0.12% ORAL RINSE (MEDLINE KIT)
15.0000 mL | Freq: Two times a day (BID) | OROMUCOSAL | Status: DC
  Administered 2019-05-16 – 2019-05-26 (×19): 15 mL via OROMUCOSAL

## 2019-05-16 MED ORDER — PROPOFOL 1000 MG/100ML IV EMUL
0.0000 ug/kg/min | INTRAVENOUS | Status: DC
  Administered 2019-05-16: 19:00:00 5 ug/kg/min via INTRAVENOUS

## 2019-05-16 MED ORDER — ETOMIDATE 2 MG/ML IV SOLN
INTRAVENOUS | Status: AC | PRN
  Administered 2019-05-16: 20 mg via INTRAVENOUS

## 2019-05-16 MED ORDER — SODIUM CHLORIDE 0.9% FLUSH: 10.0000 mL | INTRAVENOUS | Status: DC | PRN

## 2019-05-16 MED ORDER — MIDAZOLAM HCL 2 MG/2ML IJ SOLN
1.0000 mg | INTRAMUSCULAR | Status: DC | PRN
  Administered 2019-05-16 – 2019-05-25 (×6): 1 mg via INTRAVENOUS
  Filled 2019-05-16 (×3): qty 2

## 2019-05-16 MED ORDER — MIDAZOLAM HCL 2 MG/2ML IJ SOLN
1.0000 mg | INTRAMUSCULAR | Status: AC | PRN
  Administered 2019-05-22 – 2019-05-24 (×3): 1 mg via INTRAVENOUS
  Filled 2019-05-16 (×4): qty 2

## 2019-05-16 MED ORDER — DOCUSATE SODIUM 50 MG/5ML PO LIQD
100.0000 mg | Freq: Two times a day (BID) | ORAL | Status: DC | PRN
  Administered 2019-05-23 – 2019-05-26 (×4): 100 mg
  Filled 2019-05-16 (×4): qty 10

## 2019-05-16 MED ORDER — VANCOMYCIN HCL 10 G IV SOLR
1750.0000 mg | Freq: Once | INTRAVENOUS | Status: AC
  Administered 2019-05-17: 1750 mg via INTRAVENOUS
  Filled 2019-05-16 (×2): qty 1750

## 2019-05-16 MED ORDER — FUROSEMIDE 10 MG/ML IJ SOLN
60.0000 mg | Freq: Once | INTRAMUSCULAR | Status: AC
  Administered 2019-05-16: 18:00:00 60 mg via INTRAVENOUS
  Filled 2019-05-16: qty 6

## 2019-05-16 MED ORDER — FENTANYL CITRATE (PF) 100 MCG/2ML IJ SOLN
25.0000 ug | INTRAMUSCULAR | Status: DC | PRN
  Administered 2019-05-20: 100 ug via INTRAVENOUS
  Administered 2019-05-20: 50 ug via INTRAVENOUS
  Administered 2019-05-21: 100 ug via INTRAVENOUS
  Administered 2019-05-21 – 2019-05-22 (×3): 50 ug via INTRAVENOUS
  Administered 2019-05-25 (×2): 100 ug via INTRAVENOUS
  Filled 2019-05-16 (×3): qty 2

## 2019-05-16 MED ORDER — SODIUM CHLORIDE 0.9 % IV SOLN
INTRAVENOUS | Status: DC
  Administered 2019-05-16 – 2019-05-29 (×4): via INTRAVENOUS

## 2019-05-16 MED ORDER — PANTOPRAZOLE SODIUM 40 MG IV SOLR
40.0000 mg | Freq: Every day | INTRAVENOUS | Status: DC
  Administered 2019-05-16 – 2019-05-27 (×12): 40 mg via INTRAVENOUS
  Filled 2019-05-16 (×12): qty 40

## 2019-05-16 MED ORDER — LORAZEPAM 2 MG/ML IJ SOLN
0.5000 mg | Freq: Once | INTRAMUSCULAR | Status: AC
  Administered 2019-05-16: 18:00:00 0.5 mg via INTRAVENOUS
  Filled 2019-05-16: qty 1

## 2019-05-16 MED ORDER — SODIUM CHLORIDE 0.9 % IV SOLN: 500.0000 mg | Freq: Once | INTRAVENOUS | Status: DC

## 2019-05-16 MED ORDER — VANCOMYCIN HCL IN DEXTROSE 1-5 GM/200ML-% IV SOLN: 1000.0000 mg | INTRAVENOUS | Status: DC

## 2019-05-16 MED ORDER — MILRINONE LACTATE IN DEXTROSE 20-5 MG/100ML-% IV SOLN
0.1250 ug/kg/min | INTRAVENOUS | Status: DC
  Administered 2019-05-16: 23:00:00 0.125 ug/kg/min via INTRAVENOUS
  Filled 2019-05-16: qty 100

## 2019-05-16 MED ORDER — HEPARIN SODIUM (PORCINE) 5000 UNIT/ML IJ SOLN
4000.0000 [IU] | Freq: Once | INTRAMUSCULAR | Status: AC
  Administered 2019-05-16: 16:00:00 4000 [IU] via INTRAVENOUS
  Filled 2019-05-16: qty 1

## 2019-05-16 MED ORDER — ORAL CARE MOUTH RINSE
15.0000 mL | OROMUCOSAL | Status: DC
  Administered 2019-05-16 – 2019-05-26 (×90): 15 mL via OROMUCOSAL

## 2019-05-16 MED ORDER — FENTANYL BOLUS VIA INFUSION
25.0000 ug | INTRAVENOUS | Status: DC | PRN
  Administered 2019-05-19 – 2019-05-23 (×4): 25 ug via INTRAVENOUS
  Filled 2019-05-16: qty 25

## 2019-05-16 MED ORDER — NITROGLYCERIN IN D5W 200-5 MCG/ML-% IV SOLN
0.0000 ug/min | INTRAVENOUS | Status: DC
  Administered 2019-05-16: 17:00:00 5 ug/min via INTRAVENOUS
  Filled 2019-05-16: qty 250

## 2019-05-16 MED ORDER — CHLORHEXIDINE GLUCONATE CLOTH 2 % EX PADS
6.0000 | MEDICATED_PAD | Freq: Every day | CUTANEOUS | Status: DC
  Administered 2019-05-17 – 2019-06-02 (×16): 6 via TOPICAL

## 2019-05-16 MED ORDER — ASPIRIN 81 MG PO CHEW
324.0000 mg | CHEWABLE_TABLET | Freq: Once | ORAL | Status: AC
  Administered 2019-05-16: 16:00:00 324 mg via ORAL
  Filled 2019-05-16: qty 4

## 2019-05-16 NOTE — Progress Notes (Signed)
RT NOTE: RT placed patient on bipap through servo per MD order. When RT arrived to the patient's room, the patient was obviously very short of breath. RT spoke with MD about placing the patient on bipap through the servo. No covid results yet and no airborne rooms available in the ED currently. Per RT manager patient can be placed on servo bipap as long as the door remains shut and staff enter with the proper PPE including an N95.

## 2019-05-16 NOTE — Progress Notes (Signed)
eLink Physician-Brief Progress Note Patient Name: Raymond Chavez DOB: 29-Jul-1950 MRN: [id number]   Date of Service  05/13/2019  HPI/Events of Note  Pt admitted with acute respiratory failure s/p intubation due to likely acute coronary syndrome. Pt is in pulmonary edema and has regional wall motion abnormalities. There is the possibility of a co-existing lung infection although this may be less likely.  eICU Interventions  New patient evaluation completed.        Kerry Kass Ogan 05/10/2019, 11:03 PM

## 2019-05-16 NOTE — Progress Notes (Signed)
Peripherally Inserted Central Catheter/Midline Placement  The IV Nurse has discussed with the patient and/or persons authorized to consent for the patient, the purpose of this procedure and the potential benefits and risks involved with this procedure.  The benefits include less needle sticks, lab draws from the catheter, and the patient may be discharged home with the catheter. Risks include, but not limited to, infection, bleeding, blood clot (thrombus formation), and puncture of an artery; nerve damage and irregular heartbeat and possibility to perform a PICC exchange if needed/ordered by physician.  Alternatives to this procedure were also discussed.  Bard Power PICC patient education guide, fact sheet on infection prevention and patient information card has been provided to patient /or left at bedside.  Consent obtain from daughter Jacklyn Shell  PICC/Midline Placement Documentation  PICC Double Lumen 04/25/2019 PICC Right Brachial 39 cm 0 cm (Active)  Indication for Insertion or Continuance of Line Vasoactive infusions 05/04/2019 2249  Exposed Catheter (cm) 0 cm 04/30/2019 2249  Site Assessment Clean;Dry;Intact 05/01/2019 2249  Lumen #1 Status Flushed;Saline locked;Blood return noted 04/28/2019 2249  Lumen #2 Status Flushed;Saline locked;Blood return noted 05/21/2019 2249  Dressing Type Transparent 05/13/2019 2249  Dressing Status Clean;Dry;Intact;Antimicrobial disc in place 04/30/2019 2249  Dressing Change Due 05/23/19 04/26/2019 2249       Gordan Payment 05/22/2019, 10:50 PM

## 2019-05-16 NOTE — ED Notes (Signed)
Date and time results received: 05/21/2019  )  Test: troponin Critical Value: 168  Name of Provider Notified: Claiborne Billings

## 2019-05-16 NOTE — H&P (Signed)
NAME:  Raymond Chavez, MRN:  [id number], DOB:  23-Oct-1950, LOS: 0 ADMISSION DATE:  05/01/2019, CONSULTATION DATE:  04/25/2019 REFERRING MD:  Dalene Seltzer  CHIEF COMPLAINT:  SOB   Brief History   Raymond Chavez is a 68 y.o. male who was admitted with acute hypoxic respiratory failure due to acute pulmonary edema, PNA, possible ACS / NSTEMI.  He required intubation in the ED after failing BiPAP.  History of present illness   Pt is encephelopathic; therefore, this HPI is obtained from chart review. Raymond Chavez is a 68 y.o. male who has a PMH including but not limited to HTN, CAD.  He presented to [facility] ED 10/22 with dyspnea and left sided chest pain.  He was placed on BiPAP given his increased WOB; however, he failed and required intubation.  Workup in ED included CTA chest which was negative for PE but did demonstrate bilateral basilar PNA, acute pulmonary edema, hilar adenopathy.  Labs noteable for BNP 2,725, trop 168 > 712, WBC 20.  EKG with LBBB (no baseline to compare).  He has no echo on file.  He was started on heparin gtt for empiric ACS / NSTEMI as well as vanc / cefepime for PNA.  He did receive 60mg  lasix while in ED.  Cardiology was consulted by EDP and PCCM called for admission.  Bedside POCUS by cardiology reportedly with low LV output.  Past Medical History  HTN, CAD.  Significant Hospital Events   10/22 > admit.  Consults:  Cardiology.  Procedures:  ETT 10/22 >  Swan pending 10/22 >   Significant Diagnostic Tests:  CTA chest 10/22 > no PE, b/l basilar consolidation, diffuse interlobular septal thickening with GGO's, b/l hilar adenopathy. Echo 10/23 >   Micro Data:  Blood 10/22 >  Sputum 10/22 >  Urine 10/22 >  RVP 10/22 >  SARS CoV2 10/22 > neg.  Antimicrobials:  Vanc 10/22 >  Cefepime 10/22 >    Interim history/subjective:  Sedated, not responsive.  Objective:  Blood pressure 130/84, pulse 92, temperature 97.7 F (36.5 C), temperature source Oral, resp. rate  20, height 5\' 10"  (1.778 m), weight 72.6 kg, SpO2 94 %.    Vent Mode: PRVC FiO2 (%):  [50 %-100 %] 100 % Set Rate:  [10 bmp-20 bmp] 20 bmp Vt Set:  [580 mL] 580 mL PEEP:  [5 cmH20] 5 cmH20 Plateau Pressure:  [19 cmH20-20 cmH20] 19 cmH20  No intake or output data in the 24 hours ending 05/15/2019 2043 Filed Weights   05/21/2019 1600 04/28/2019 2030  Weight: 72.6 kg 72.6 kg    Examination: General: Adult male, in NAD. Neuro: Sedated, not responsive. HEENT: [place]/AT. Sclerae anicteric.  ETT in place. Cardiovascular: RRR, no M/R/G.  Lungs: Respirations even and unlabored.  Some crackles in bases, otherwise clear. Abdomen: BS x 4, soft, NT/ND.  Musculoskeletal: No gross deformities, no edema.  Skin: Intact, feet cool, no rashes.  Assessment & Plan:   Acute hypoxic respiratory failure requiring intubation - multifactorial due to PNA, acute pulmonary edema (s/p 60mg  lasix in ED), ACS / NSTEMI. - Full vent support. - Wean as able. - Daily SBT. - Empiric vanc / cefepime for now, follow cultures and de-escalate as able. - Assess RVP, urine strep, urine legionella. - Follow CXR.  ACS / NSTEMI with probable low output CHF. - Cardiology consulted by EDP. - Trend troponins. - Continue heparin gtt. - Milrinone per cards. 05/14/2019 per cards. - Assess echo.  Hyponatremia - presumed hypervolemic in setting  underlying CHF. AKI. NAGMA. - Strict I/O's. - 2 amps HCO3 now. - Follow BMP.  Hx HTN, CAD. - Continue preadmission ASA. - Hold preadmission amlodipine, atorvastatin, HCTZ, lisinopril.   Best Practice:  Diet: NPO. Pain/Anxiety/Delirium protocol (if indicated): Propofol gtt / Fentanyl PRN.  RASS goal 0 to -1. VAP protocol (if indicated): In place. DVT prophylaxis: SCD's / Heparin gtt. GI prophylaxis: PPI. Glucose control: SSI. Mobility: Bedrest. Code Status: Full. Family Communication: Will call daughter Carmelia Bake listed in system). Disposition: ICU.  Labs   CBC: Recent  Labs  Lab 05/22/19 1515 2019/05/22 1834 2019-05-22 1929  WBC 16.9* 19.6*  --   NEUTROABS  --  17.1*  --   HGB 15.0 17.2* 19.7*  HCT 45.0 53.3* 58.0*  MCV 89.8 94.3  --   PLT 200 213  --    Basic Metabolic Panel: Recent Labs  Lab 05-22-19 1515 05/22/2019 1834 2019/05/22 1929  NA 132* 131* 131*  K 4.2 4.3 5.1  CL 103 102  --   CO2 15* 14*  --   GLUCOSE 149* 345*  --   BUN 14 16  --   CREATININE 1.34* 1.96*  --   CALCIUM 9.9 9.4  --    GFR: Estimated Creatinine Clearance: 36.5 mL/min (A) (by C-G formula based on SCr of 1.96 mg/dL (H)). Recent Labs  Lab 2019/05/22 1515 May 22, 2019 1834  WBC 16.9* 19.6*   Liver Function Tests: Recent Labs  Lab 2019/05/22 1834  AST 33  ALT 23  ALKPHOS 108  BILITOT 1.4*  PROT 7.8  ALBUMIN 3.6   No results for input(s): LIPASE, AMYLASE in the last 168 hours. No results for input(s): AMMONIA in the last 168 hours. ABG    Component Value Date/Time   PHART 7.108 (LL) 2019/05/22 1929   PCO2ART 44.5 05-22-2019 1929   PO2ART 101.0 May 22, 2019 1929   HCO3 14.2 (L) 05/22/2019 1929   TCO2 16 (L) 05/22/19 1929   ACIDBASEDEF 15.0 (H) 05/22/19 1929   O2SAT 95.0 05/22/19 1929    Coagulation Profile: No results for input(s): INR, PROTIME in the last 168 hours. Cardiac Enzymes: No results for input(s): CKTOTAL, CKMB, CKMBINDEX, TROPONINI in the last 168 hours. HbA1C: No results found for: HGBA1C CBG: No results for input(s): GLUCAP in the last 168 hours.  Review of Systems:   Unable to obtain as pt is encephalopathic.  Past medical history  He,  has a past medical history of Carotid artery occlusion and Hypertension.   Surgical History   No past surgical history on file.   Social History   reports that he has been smoking cigarettes. He has been smoking about 0.00 packs per day for the past 40.00 years. He has never used smokeless tobacco. He reports current alcohol use of about 6.0 - 12.0 standard drinks of alcohol per week. He reports  that he does not use drugs.   Family history   His family history is not on file.   Allergies No Known Allergies   Home meds  Prior to Admission medications   Medication Sig Start Date End Date Taking? Authorizing Provider  albuterol (PROAIR HFA) 108 (90 Base) MCG/ACT inhaler Inhale 2 puffs into the lungs every 4 (four) hours as needed for wheezing or shortness of breath.    Yes [provider]  amLODipine (NORVASC) 5 MG tablet Take 5 mg by mouth daily. 12/04/17  Yes [provider]  aspirin EC 81 MG tablet Take 81 mg by mouth daily.  Yes [provider]  atorvastatin (LIPITOR) 40 MG tablet Take 40 mg by mouth at bedtime. 03/06/19  Yes [provider]  Azelastine HCl 137 MCG/SPRAY SOLN Place 1 spray into both nostrils 2 (two) times daily as needed (for allergic symptoms or rhinitis).  03/28/19  Yes [provider]  predniSONE (DELTASONE) 20 MG tablet Take 20-60 mg by mouth See admin instructions. Take 60 mg by mouth once a day for 3 days, 40 mg once a day for 3 days, then 20 mg once a day for 3 days 05/15/19  Yes [provider]  SPIRIVA RESPIMAT 1.25 MCG/ACT AERS Inhale 2 puffs into the lungs daily. 04/23/19  Yes [provider]  hydrochlorothiazide (HYDRODIURIL) 12.5 MG tablet Take 12.5 mg by mouth daily.    [provider]  lisinopril (PRINIVIL,ZESTRIL) 20 MG tablet Take 1 tablet (20 mg total) by mouth daily. Patient not taking: Reported on 05/09/2019 11/28/13   Liam Graham, PA-C    Critical care time: 45 min.    Montey Hora, [facility] 04/28/2019, 8:43 PM

## 2019-05-16 NOTE — ED Provider Notes (Addendum)
[facility] EMERGENCY DEPARTMENT Provider Note   CSN: [id number] Arrival date & time: 05/15/2019  1450     History   Chief Complaint Chief Complaint  Patient presents with   Chest Pain   Shortness of Breath    HPI Raymond Chavez is a 68 y.o. male.     HPI  68yo male with history of carotid artery disease, smoking, hypertension, remote MI 1998, presents with concern for shortness of breath and chest pain.  Patient reports he was in normal state of health until hours prior to arrival when he developed shortness of breath and mild chest pain.  Reports his major concern is shortness of breath, and that he did admit to some left sided chest discomfort but is not really having it now.  Pain did not radiate, located to left side. Not worse necessarily with exertion, not pleuritic, not positional.  No asymmetric leg swelling, diaphoresis or nausea. No recent surgeries or immobilization, no hx of DVT/PE.  Denies cough or known sick contacts.  No fever.  Reports his shortness of breath is severe. Has a history of smoking for many years but no known history of COPD or asthma.  Daughter reports he may have been sick for longer than what he is describing as he is stoic.       Past Medical History:  Diagnosis Date   Carotid artery occlusion    Hypertension     Patient Active Problem List   Diagnosis Date Noted   Respiratory failure ([place])    Acute hypoxemic respiratory failure ([place]) 05/24/2019   NSTEMI (non-ST elevated myocardial infarction) ([place])    Acute pulmonary edema ([place])    Community acquired pneumonia    AKI (acute kidney injury) ([place])     No past surgical history on file.      Home Medications    Prior to Admission medications   Medication Sig Start Date End Date Taking? Authorizing Provider  albuterol (PROAIR HFA) 108 (90 Base) MCG/ACT inhaler Inhale 2 puffs into the lungs every 4 (four) hours as needed for wheezing or shortness of breath.     Yes [provider]  amLODipine (NORVASC) 5 MG tablet Take 5 mg by mouth daily. 12/04/17  Yes [provider]  aspirin EC 81 MG tablet Take 81 mg by mouth daily.   Yes [provider]  atorvastatin (LIPITOR) 40 MG tablet Take 40 mg by mouth at bedtime. 03/06/19  Yes [provider]  Azelastine HCl 137 MCG/SPRAY SOLN Place 1 spray into both nostrils 2 (two) times daily as needed (for allergic symptoms or rhinitis).  03/28/19  Yes [provider]  predniSONE (DELTASONE) 20 MG tablet Take 20-60 mg by mouth See admin instructions. Take 60 mg by mouth once a day for 3 days, 40 mg once a day for 3 days, then 20 mg once a day for 3 days 05/15/19  Yes [provider]  SPIRIVA RESPIMAT 1.25 MCG/ACT AERS Inhale 2 puffs into the lungs daily. 04/23/19  Yes [provider]  hydrochlorothiazide (HYDRODIURIL) 12.5 MG tablet Take 12.5 mg by mouth daily.    [provider]  lisinopril (PRINIVIL,ZESTRIL) 20 MG tablet Take 1 tablet (20 mg total) by mouth daily. Patient not taking: Reported on 05/22/2019 11/28/13   Liam Graham, PA-C    Family History No family history on file.  Social History Social History   Tobacco Use   Smoking status: Light Tobacco Smoker    Packs/day: 0.00  Years: 40.00    Pack years: 0.00    Types: Cigarettes    Last attempt to quit: 10/23/2017    Years since quitting: 1.5   Smokeless tobacco: Never Used  Substance Use Topics   Alcohol use: Yes    Alcohol/week: 6.0 - 12.0 standard drinks    Types: 6 - 12 Cans of beer per week   Drug use: No     Allergies   Patient has no known allergies.   Review of Systems Review of Systems  Constitutional: Negative for fever.  HENT: Negative for sore throat.   Eyes: Negative for visual disturbance.  Respiratory: Positive for shortness of breath. Negative for cough.   Cardiovascular: Positive for chest pain.  Gastrointestinal: Negative for abdominal pain.   Genitourinary: Negative for difficulty urinating.  Musculoskeletal: Negative for back pain and neck stiffness.  Skin: Negative for rash.  Neurological: Negative for syncope and headaches.     Physical Exam Updated Vital Signs BP 107/81    Pulse 88    Temp 100 F (37.8 C)    Resp 17    Ht '5\' 7"'  (1.702 m) Comment: measured x3   Wt 70.5 kg    SpO2 98%    BMI 24.34 kg/m   Physical Exam Vitals signs and nursing note reviewed.  Constitutional:      General: He is in acute distress.     Appearance: He is well-developed. He is not diaphoretic.  HENT:     Head: Normocephalic and atraumatic.  Eyes:     Conjunctiva/sclera: Conjunctivae normal.  Neck:     Musculoskeletal: Normal range of motion.  Cardiovascular:     Rate and Rhythm: Regular rhythm. Tachycardia present.     Heart sounds: Normal heart sounds. No murmur. No friction rub. No gallop.   Pulmonary:     Effort: Tachypnea and accessory muscle usage present. No respiratory distress.     Breath sounds: Normal breath sounds. No wheezing or rales.     Comments: Diminished breath sounds Severe tachypnea Abdominal:     General: There is no distension.     Palpations: Abdomen is soft.     Tenderness: There is no abdominal tenderness. There is no guarding.  Skin:    General: Skin is warm and dry.  Neurological:     Mental Status: He is alert and oriented to person, place, and time.      ED Treatments / Results  Labs (all labs ordered are listed, but only abnormal results are displayed) Labs Reviewed  BASIC METABOLIC PANEL - Abnormal; Notable for the following components:      Result Value   Sodium 132 (*)    CO2 15 (*)    Glucose, Bld 149 (*)    Creatinine, Ser 1.34 (*)    GFR calc non Af Amer 54 (*)    All other components within normal limits  CBC - Abnormal; Notable for the following components:   WBC 16.9 (*)    All other components within normal limits  PROTIME-INR - Abnormal; Notable for the following  components:   Prothrombin Time 20.0 (*)    INR 1.7 (*)    All other components within normal limits  APTT - Abnormal; Notable for the following components:   aPTT 52 (*)    All other components within normal limits  LIPID PANEL - Abnormal; Notable for the following components:   HDL 30 (*)    All other components within normal limits  BRAIN NATRIURETIC PEPTIDE -  Abnormal; Notable for the following components:   B Natriuretic Peptide 2,725.3 (*)    All other components within normal limits  CBC WITH DIFFERENTIAL/PLATELET - Abnormal; Notable for the following components:   WBC 19.6 (*)    Hemoglobin 17.2 (*)    HCT 53.3 (*)    Neutro Abs 17.1 (*)    Abs Immature Granulocytes 0.11 (*)    All other components within normal limits  COMPREHENSIVE METABOLIC PANEL - Abnormal; Notable for the following components:   Sodium 131 (*)    CO2 14 (*)    Glucose, Bld 345 (*)    Creatinine, Ser 1.96 (*)    Total Bilirubin 1.4 (*)    GFR calc non Af Amer 34 (*)    GFR calc Af Amer 39 (*)    All other components within normal limits  LACTIC ACID, PLASMA - Abnormal; Notable for the following components:   Lactic Acid, Venous 4.7 (*)    All other components within normal limits  LACTIC ACID, PLASMA - Abnormal; Notable for the following components:   Lactic Acid, Venous 4.5 (*)    All other components within normal limits  CBC - Abnormal; Notable for the following components:   WBC 22.2 (*)    All other components within normal limits  COOXEMETRY PANEL - Abnormal; Notable for the following components:   Total hemoglobin 16.6 (*)    All other components within normal limits  COOXEMETRY PANEL - Abnormal; Notable for the following components:   Total hemoglobin 18.0 (*)    All other components within normal limits  TSH - Abnormal; Notable for the following components:   TSH 7.401 (*)    All other components within normal limits  COOXEMETRY PANEL - Abnormal; Notable for the following components:    Total hemoglobin 17.7 (*)    All other components within normal limits  COOXEMETRY PANEL - Abnormal; Notable for the following components:   Total hemoglobin 16.6 (*)    All other components within normal limits  LACTIC ACID, PLASMA - Abnormal; Notable for the following components:   Lactic Acid, Venous 2.5 (*)    All other components within normal limits  GLUCOSE, CAPILLARY - Abnormal; Notable for the following components:   Glucose-Capillary 130 (*)    All other components within normal limits  COMPREHENSIVE METABOLIC PANEL - Abnormal; Notable for the following components:   Sodium 134 (*)    CO2 16 (*)    Glucose, Bld 144 (*)    Creatinine, Ser 2.08 (*)    Calcium 8.5 (*)    Albumin 3.2 (*)    AST 70 (*)    Total Bilirubin 1.8 (*)    GFR calc non Af Amer 32 (*)    GFR calc Af Amer 37 (*)    All other components within normal limits  GLUCOSE, CAPILLARY - Abnormal; Notable for the following components:   Glucose-Capillary 226 (*)    All other components within normal limits  HEPARIN LEVEL (UNFRACTIONATED) - Abnormal; Notable for the following components:   Heparin Unfractionated 0.17 (*)    All other components within normal limits  GLUCOSE, CAPILLARY - Abnormal; Notable for the following components:   Glucose-Capillary 146 (*)    All other components within normal limits  CBC WITH DIFFERENTIAL/PLATELET - Abnormal; Notable for the following components:   WBC 20.8 (*)    Neutro Abs 17.8 (*)    Monocytes Absolute 1.2 (*)    Abs Immature Granulocytes 0.13 (*)  All other components within normal limits  BASIC METABOLIC PANEL - Abnormal; Notable for the following components:   Potassium 5.7 (*)    CO2 16 (*)    Glucose, Bld 143 (*)    BUN 26 (*)    Creatinine, Ser 2.31 (*)    Calcium 8.2 (*)    GFR calc non Af Amer 28 (*)    GFR calc Af Amer 32 (*)    All other components within normal limits  POCT I-STAT 7, (LYTES, BLD GAS, ICA,H+H) - Abnormal; Notable for the  following components:   pH, Arterial 7.108 (*)    Bicarbonate 14.2 (*)    TCO2 16 (*)    Acid-base deficit 15.0 (*)    Sodium 131 (*)    HCT 58.0 (*)    Hemoglobin 19.7 (*)    All other components within normal limits  POCT I-STAT 7, (LYTES, BLD GAS, ICA,H+H) - Abnormal; Notable for the following components:   pH, Arterial 7.237 (*)    Bicarbonate 15.2 (*)    TCO2 16 (*)    Acid-base deficit 12.0 (*)    Sodium 134 (*)    Hemoglobin 17.3 (*)    All other components within normal limits  POCT I-STAT 7, (LYTES, BLD GAS, ICA,H+H) - Abnormal; Notable for the following components:   pH, Arterial 7.255 (*)    Bicarbonate 14.4 (*)    TCO2 15 (*)    Acid-base deficit 11.0 (*)    Sodium 134 (*)    Potassium 5.8 (*)    Hemoglobin 17.7 (*)    All other components within normal limits  TROPONIN I (HIGH SENSITIVITY) - Abnormal; Notable for the following components:   Troponin I (High Sensitivity) 168 (*)    All other components within normal limits  TROPONIN I (HIGH SENSITIVITY) - Abnormal; Notable for the following components:   Troponin I (High Sensitivity) 712 (*)    All other components within normal limits  TROPONIN I (HIGH SENSITIVITY) - Abnormal; Notable for the following components:   Troponin I (High Sensitivity) 1,456 (*)    All other components within normal limits  TROPONIN I (HIGH SENSITIVITY) - Abnormal; Notable for the following components:   Troponin I (High Sensitivity) 4,254 (*)    All other components within normal limits  SARS CORONAVIRUS 2 BY RT PCR (HOSPITAL ORDER, [facility])  CULTURE, BLOOD (ROUTINE X 2)  CULTURE, BLOOD (ROUTINE X 2)  RESPIRATORY PANEL BY PCR  MRSA PCR SCREENING  URINE CULTURE  CULTURE, RESPIRATORY  HIV ANTIBODY (ROUTINE TESTING W REFLEX)  STREP PNEUMONIAE URINARY ANTIGEN  TRIGLYCERIDES  HEMOGLOBIN A1C  MAGNESIUM  PHOSPHORUS  PROCALCITONIN  LEGIONELLA PNEUMOPHILA SEROGP 1 UR AG  BLOOD GAS, ARTERIAL  BRAIN  NATRIURETIC PEPTIDE    EKG EKG Interpretation  Date/Time:  Thursday May 16 2019 16:18:18 EDT Ventricular Rate:  110 PR Interval:  200 QRS Duration: 142 QT Interval:  363 QTC Calculation: 489 R Axis:   14 Text Interpretation:  Sinus tachycardia Ventricular tachycardia, unsustained Left bundle branch block Since prior ECG earlier today,  no other acute changes.  Confirmed by Gareth Morgan [phone]) on 05/24/2019 3:36:20 AM   Radiology Ct Angio Chest Pe W And/or Wo Contrast  Result Date: 05/13/2019 CLINICAL DATA:  Shortness of breath. EXAM: CT ANGIOGRAPHY CHEST WITH CONTRAST TECHNIQUE: Multidetector CT imaging of the chest was performed using the standard protocol during bolus administration of intravenous contrast. Multiplanar CT image reconstructions and MIPs were obtained to  evaluate the vascular anatomy. CONTRAST:  71m OMNIPAQUE IOHEXOL 350 MG/ML SOLN COMPARISON:  12/05/2017 FINDINGS: Cardiovascular: The main pulmonary artery is patent. There is no central obstructing or saddle embolus identified. No lobar pulmonary artery filling defects for segmental pulmonary artery filling defects identified. Normal heart size. No pericardial effusion. Aortic atherosclerosis. 3 vessel coronary artery calcifications. Mediastinum/Nodes: The endotracheal tube tip is above the carina. There is a nasogastric tube with tip in the stomach. No supraclavicular, axillary or mediastinal adenopathy. Diffuse increased soft tissue within bilateral hilar regions identified, new from previous exam. For example, within the right hilar region there is increased soft tissue measuring 3.7 x 2.9 cm. Within the left suprahilar region there is increased soft tissue measuring 3.1 x 2.0 cm. Lungs/Pleura: There is dense bilateral lower lobe posterior airspace consolidation. There is also bilateral upper lobe posterior subpleural consolidation. Diffuse interlobular septal thickening with areas of ground-glass attenuation  identified compatible with pulmonary edema. Upper Abdomen: No acute abnormality. Musculoskeletal: No chest wall abnormality. No acute or significant osseous findings. Review of the MIP images confirms the above findings. IMPRESSION: 1. No evidence for acute pulmonary embolus. 2. Bilateral lower lobe posterior subpleural consolidation is identified compatible with multifocal pneumonia. 3. Diffuse interlobular septal thickening, ground-glass attenuation and areas of ground-glass attenuation compatible with pulmonary edema which may be secondary to heart failure 4. Interval development of bilateral hilar adenopathy which may be reactive in etiology. Differential considerations include granulomatous inflammation/infection, lymphoproliferative disorder or metastatic adenopathy. This is may also be nonspecific in the setting of CHF. Advise follow-up imaging in 3 months with repeat CT of the chest with contrast. This recommendation follows ACR consensus guidelines: Managing Incidental Findings on Thoracic CT: Mediastinal and Cardiovascular Findings. A White Paper of the ACR Incidental Findings Committee. J Am Coll Radiol. 2018; 15:: [id number] 5. 3 vessel coronary artery calcifications noted. Aortic Atherosclerosis (ICD10-I70.0). Electronically Signed   By: TKerby MoorsM.D.   On: 05/25/2019 20:18   Dg Chest Port 1 View  Result Date: 05/20/2019 CLINICAL DATA:  Respiratory failure EXAM: PORTABLE CHEST 1 VIEW COMPARISON:  04/29/2019 FINDINGS: Endotracheal tube, gastric catheter and right-sided PICC line are noted in satisfactory position. Cardiac shadow is mildly enlarged but stable. Aortic calcifications are again seen. Vascular congestion is noted patchy parenchymal opacities bilaterally likely related to edema. Left retrocardiac density remains. No bony abnormality is seen. IMPRESSION: Vascular congestion and patchy opacities likely related to edema. Left retrocardiac atelectasis. Electronically Signed   By: MInez CatalinaM.D.   On: 05/24/2019 07:37   Dg Chest Portable 1 View  Result Date: 04/29/2019 CLINICAL DATA:  Intubation. Image 2 after pulled back. EXAM: PORTABLE CHEST 1 VIEW COMPARISON:  05/11/2019 FINDINGS: Endotracheal tube has been placed. On the initial image the tube is 3.6 centimeters above the carina. On the second image, tube is approximately 5.4 centimeters above the carina. Nasogastric tube is identified, tip beyond the level of the proximal stomach. Heart size is normal. There are hazy infiltrates throughout the lungs bilaterally, slightly increased since the prior study. Opacity at the LEFT lung base is more confluent. IMPRESSION: 1. Interval placement of endotracheal tube. 2. Increased bilateral infiltrates. 3. More confluent opacity at the LEFT lung base. Electronically Signed   By: ENolon NationsM.D.   On: 05/10/2019 18:57   Dg Chest Port 1 View  Result Date: 05/02/2019 CLINICAL DATA:  Chest pain. EXAM: PORTABLE CHEST 1 VIEW COMPARISON:  September 18, 2018. FINDINGS: The heart size and mediastinal contours are within  normal limits. No pneumothorax or pleural effusion is noted. Multiple small ill-defined interstitial densities are noted throughout both lungs which may represent focal inflammation. The visualized skeletal structures are unremarkable. IMPRESSION: Multiple small ill-defined interstitial densities are noted throughout both lungs which potentially may represent multifocal inflammation or possibly edema. Electronically Signed   By: Marijo Conception M.D.   On: 04/29/2019 16:15   [place] Ekg Site Rite  Result Date: 04/28/2019 If Site Rite image not attached, placement could not be confirmed due to current cardiac rhythm.   Procedures Procedure Name: Intubation Date/Time: 04/30/2019 3:28 AM Performed by: Gareth Morgan, MD Pre-anesthesia Checklist: Patient identified, Patient being monitored, Emergency Drugs available, Timeout performed and Suction available Oxygen Delivery  Method: Non-rebreather mask Preoxygenation: Pre-oxygenation with 100% oxygen Induction Type: Rapid sequence Ventilation: Mask ventilation without difficulty Laryngoscope Size: Glidescope Grade View: Grade I Placement Confirmation: ETT inserted through vocal cords under direct vision,  CO2 detector and Breath sounds checked- equal and bilateral    .Critical Care Performed by: Gareth Morgan, MD Authorized by: Gareth Morgan, MD   Critical care provider statement:    Critical care time (minutes):  45   Critical care was time spent personally by me on the following activities:  Discussions with consultants, evaluation of patient's response to treatment, examination of patient, ordering and performing treatments and interventions, ordering and review of laboratory studies, ordering and review of radiographic studies, pulse oximetry, re-evaluation of patient's condition, obtaining history from patient or surrogate and review of old charts   (including critical care time)  Medications Ordered in ED Medications  0.9 %  sodium chloride infusion ( Intravenous New Bag/Given 05/09/2019 1614)  aspirin 81 MG chewable tablet (has no administration in time range)  heparin 5000 UNIT/ML injection (has no administration in time range)  ceFEPIme (MAXIPIME) 2 g in sodium chloride 0.9 % 100 mL IVPB ( Intravenous Automatically Held 06/01/19 2200)  vancomycin (VANCOCIN) IVPB 1000 mg/200 mL premix ( Intravenous Automatically Held 05/25/19 2000)  pantoprazole (PROTONIX) injection 40 mg ( Intravenous Automatically Held 06/01/19 2200)  fentaNYL (SUBLIMAZE) injection 25 mcg ( Intravenous MAR Hold 05/10/2019 0923)  fentaNYL (SUBLIMAZE) injection 25-100 mcg ( Intravenous MAR Hold 04/30/2019 0923)  sodium bicarbonate injection 100 mEq ( Intravenous MAR Hold 05/02/2019 0923)  aspirin chewable tablet 81 mg ( Per Tube Automatically Held 05/25/19 1000)  insulin aspart (novoLOG) injection 1-3 Units ( Subcutaneous  Automatically Held 06/01/19 2000)  fentaNYL 2546mg in NS 2540m(1041mml) infusion-PREMIX (200 mcg/hr Intravenous New Bag/Given 05/14/2019 0914)  fentaNYL (SUBLIMAZE) bolus via infusion 25 mcg ( Intravenous MAR Hold 04/28/2019 0923)  midazolam (VERSED) injection 1 mg ( Intravenous MAR Hold 04/30/2019 0923)  midazolam (VERSED) injection 1 mg ( Intravenous MAR Hold 05/24/2019 0923)  gabapentin (NEURONTIN) 250 MG/5ML solution 100 mg ( Per Tube Automatically Held 06/01/19 2200)  docusate (COLACE) 50 MG/5ML liquid 100 mg ( Per Tube MAR Hold 05/02/2019 0923)  sodium chloride flush (NS) 0.9 % injection 10-40 mL ( Intracatheter Automatically Held 06/01/19 2200)  sodium chloride flush (NS) 0.9 % injection 10-40 mL ( Intracatheter MAR Hold 04/30/2019 0923)  Chlorhexidine Gluconate Cloth 2 % PADS 6 each ( Topical Automatically Held 05/25/19 1000)  chlorhexidine gluconate (MEDLINE KIT) (PERIDEX) 0.12 % solution 15 mL ( Mouth Rinse Automatically Held 06/01/19 2000)  MEDLINE mouth rinse ( Mouth Rinse Automatically Held 05/25/19 2200)  norepinephrine (LEVOPHED) 4mg61m 250mL36mmix infusion ( Intravenous MAR Hold 05/11/2019 0923)  sodium chloride flush (NS) 0.9 % injection 3 mL (  Intravenous Automatically Held 05/25/19 2200)  sodium chloride flush (NS) 0.9 % injection 3 mL (has no administration in time range)  0.9 %  sodium chloride infusion (has no administration in time range)  0.9 %  sodium chloride infusion ( Intravenous Not Given 05/22/2019 0830)  atorvastatin (LIPITOR) tablet 80 mg ( Oral Automatically Held 05/25/19 1800)  midazolam (VERSED) injection (1 mg Intravenous Given 05/03/2019 1125)  lidocaine (PF) (XYLOCAINE) 1 % injection (16 mLs  Given 05/06/2019 1000)  patiromer Daryll Drown) packet 8.4 g (has no administration in time range)  furosemide (LASIX) injection (40 mg Intravenous Given 04/25/2019 1012)  heparin injection (1,000 Units Intravenous Given 05/04/2019 1100)  midazolam (VERSED) 50 mg/50 mL (1 mg/mL) premix infusion  (has no administration in time range)  iohexol (OMNIPAQUE) 350 MG/ML injection (20 mLs  Given 05/20/2019 1151)  Heparin (Porcine) in NaCl 1000-0.9 UT/500ML-% SOLN (500 mLs  Given 05/19/2019 1152)  aspirin chewable tablet 324 mg (324 mg Oral Given 05/23/2019 1613)  heparin injection 4,000 Units (4,000 Units Intravenous Given 05/23/2019 1613)  methylPREDNISolone sodium succinate (SOLU-MEDROL) 125 mg/2 mL injection 125 mg (125 mg Intravenous Given 05/22/2019 1637)  ipratropium (ATROVENT HFA) inhaler 8 puff (8 puffs Inhalation Given 05/13/2019 1635)  albuterol (VENTOLIN HFA) 108 (90 Base) MCG/ACT inhaler (  Given 05/07/2019 1723)  LORazepam (ATIVAN) injection 0.5 mg (0.5 mg Intravenous Given 05/09/2019 1747)  furosemide (LASIX) injection 60 mg (60 mg Intravenous Given 05/15/2019 1747)  etomidate (AMIDATE) injection (20 mg Intravenous Given 05/14/2019 1816)  rocuronium (ZEMURON) injection (100 mg Intravenous Given 04/27/2019 1817)  vancomycin (VANCOCIN) 1,750 mg in sodium chloride 0.9 % 500 mL IVPB ( Intravenous Stopped 04/26/2019 0422)  iohexol (OMNIPAQUE) 350 MG/ML injection 64 mL (64 mLs Intravenous Contrast Given 05/10/2019 1956)  fentaNYL (SUBLIMAZE) injection 25 mcg (25 mcg Intravenous Given 05/21/2019 2318)  sodium bicarbonate injection 50 mEq (50 mEq Intravenous Given 05/09/2019 0701)  aspirin chewable tablet 81 mg (0 mg Oral Duplicate 28/36/62 9476)  lidocaine (PF) (XYLOCAINE) 1 % injection (has no administration in time range)  Heparin (Porcine) in NaCl 1000-0.9 UT/500ML-% SOLN (has no administration in time range)  midazolam (VERSED) 2 MG/2ML injection (has no administration in time range)  furosemide (LASIX) 10 MG/ML injection (has no administration in time range)     Initial Impression / Assessment and Plan / ED Course  I have reviewed the triage vital signs and the nursing notes.  Pertinent labs & imaging results that were available during my care of the patient were reviewed by me and considered in my medical  decision making (see chart for details).        68yo male with history of carotid artery disease, smoking, hypertension, remote MI 1998, presents with concern for shortness of breath and chest pain.  EKG on arrival significant for LBBB with excessive discordance and given dyspnea with chest pain and no prior ECG for comparison, called a CODE STEMI.  Dr. Claiborne Billings came to bedside for evaluation.  Patient with significant dyspnea as chief complaint and denied any ongoing chest pain at time of Dr. Evette Georges evaluation. Troponin 168. Given wide differential for symptoms, unknown if new findings on ECG, Dr. Claiborne Billings did not take the patient to the cath lab.  DDx continued to include ACS, CHF, COVID19 infection, pneumonia, PE, pneumothorax.  XR does not show evidence of pneumothorax, does show interstitial densities which may be multifocal inflammation or edema.   Pt does have prolonged expiratory phase, diminished BS, hx of smoking.  Concern clinically for possible  mixed CHF exacerbation and COPD.  Initially ordered 45m IV lasix with good UOP, nitro gtt (although not able to titrate up very high given BP,) albuterol/atrovent and solumedrol.  He did receive a bolus of heparin on arrival as possible CODE STEMI but did not receive continuing gtt immediately as unclear clinical picture of ACS.  He was placed on nonrebreather due to work of breathing (although did not have hypoxia,) however had continued respiratory distress.  He was placed onto BiPAP for continued incrased work of breathing thought to be secondary to pulmonary edema and possible COPD after discussion of protocols during time of COVID19 pandemic.  Despite diuretics, steroids, albuterol, nitro gtt, and BiPAP he continued to have severe dyspnea and increased work of breathing and began to have desaturations while on BiPAP. Discussed this with patient and made decision to intubate for acute respiratory failure.  After intubation, did obtain CT PE  study which does not show evidence of PE.  While clinically I have lower suspicion for pneumonia, given repeat XR after intubation, CT findings, leukocytosis, will cover with abx for pneumonia-given vanc/cefepime/azithromycin. Second troponin elevated to 700s. Called Cardiology Dr. TMarletta Lorwho also discussed with Dr. JMartinique  Will initiate heparin gtt, she will obtain bedside ECHO, and continue to monitor. Does not currently have plan to bring to cathlab tonight.    Called ICU for admission for acute respiratory failure    Final Clinical Impressions(s) / ED Diagnoses   Final diagnoses:  Acute respiratory failure, unspecified whether with hypoxia or hypercapnia (HCC)  Acute pulmonary edema (HCC)  Troponin level elevated    ED Discharge Orders    None       SGareth Morgan MD 05/07/2019 [id number]   SGareth Morgan MD 05/04/2019 1154

## 2019-05-16 NOTE — ED Notes (Signed)
Pt remains on  bipap,

## 2019-05-16 NOTE — ED Notes (Signed)
Admitting MD at the bedside.  

## 2019-05-16 NOTE — ED Notes (Signed)
Family at bedside. 

## 2019-05-16 NOTE — ED Notes (Signed)
Pt placed on NRB due to work or breathing , pt sats remain in the high 90's  resp rate remains high in the 40 's  MD  At bedside

## 2019-05-16 NOTE — Progress Notes (Signed)
Pharmacy Antibiotic and Anticoagulation Note  Raymond Chavez is a 68 y.o. male with PMH of HTN, and CAD, who presents with acute hypoxic respiratory failure due to acute pulmonary edema, PNA, possible ACS/NSETMI. Pharmacy has been consulted to dose Vancomycin, Cefepime, and Heparin dosing.  Hg/Hct is high at 19.7/58. Hs troponins are elevated 168 >> 712. WBC is elevated at 19.6 Pt is hypothermic with a temp of 97.7. Of note, pt's BNP is elevated at 2,725.3  No Known Allergies  Patient Measurements: Height: 5\' 10"  (177.8 cm) Weight: 160 lb (72.6 kg) IBW/kg (Calculated) : 73 Heparin Dosing Weight: 72.6 kg  Vital Signs: Temp: 97.7 F (36.5 C) (10/22 1459) Temp Source: Oral (10/22 1459) BP: 128/89 (10/22 1945) Pulse Rate: 95 (10/22 1945)  Labs: Recent Labs    05/11/2019 1515 04/30/2019 1834 04/26/2019 1929  HGB 15.0 17.2* 19.7*  HCT 45.0 53.3* 58.0*  PLT 200 213  --   CREATININE 1.34* 1.96*  --   TROPONINIHS 168* 712*  --     Estimated Creatinine Clearance: 36.5 mL/min (A) (by C-G formula based on SCr of 1.96 mg/dL (H)).   Medical History: Past Medical History:  Diagnosis Date  . Carotid artery occlusion   . Hypertension     Medications:  Scheduled:  . aspirin      . heparin       Infusions:  . sodium chloride 20 mL/hr at 05/15/2019 1614  . ceFEPime (MAXIPIME) IV    . propofol (DIPRIVAN) infusion 5 mcg/kg/min (05/04/2019 1854)  . propofol    . vancomycin    . [START ON 05/24/2019] vancomycin       Goal of Therapy:  Heparin level 0.3-0.7 units/ml Monitor platelets by anticoagulation protocol: Yes   Plan:  Will not bolus since patient received a bolus of heparin earlier in the afternoon Start heparin infusion at 900 units/hr Check anti-Xa level in 6 hours and daily while on heparin Continue to monitor H&H and platelets Monitor for signs/symptoms of bleeding  Vancomycin 1,750 mg IV x 1 as loading dose Vancomycin 1000  mg IV Q 24 hrs. Goal AUC 400-550. Expected  AUC: 550 (will likely be less once renal function improves) SCr used: 1.96 Cefepime IV 2g Q 12 hours Monitor renal function, clinical status, WBC, temp   Antimicrobials this admission: Cefepime 10/22 >>  Vancomycin 10/22 >>   Microbiology results: 10/22 BCx: Sent 10/22 UCx: Sent 10/22 Sputum: Sent 10/22 Respiratory Panel Sent 10/22 COVID: negative  Thank you for allowing pharmacy to be a part of this patient's care.  Sherren Kerns, PharmD PGY1 Acute Care Pharmacy Resident 05/22/2019 8:34 PM

## 2019-05-16 NOTE — Progress Notes (Signed)
RT NOTE: RT pulled back ETT 2cm per MD order. EET is now 23cm at the lip. RT will continue to monitor.

## 2019-05-16 NOTE — Consult Note (Addendum)
Cardiology Consultation:   Patient ID: Raymond Chavez MRN: [id number]; DOB: 1950/08/07  Admit date: 05/04/2019 Date of Consult: 05/23/2019  Primary Care Provider: Clovis Chavez, L.August Saucer, MD Primary Cardiologist: No primary care provider on file.  Primary Electrophysiologist:  None    Patient Profile:   Raymond Chavez is a 68 y.o. male with a hx CAD s/p MI (1998), HTN, carotid disease who presented with acute hypoxemic respiratory failure, AKI, leukocytosis metabolic acidosis and elevated BNP concerning for sepsis v. Cardiogenic shock.    History of Present Illness:   Per his daughter, Raymond Chavez has a history of CAD s/p MI in 1998 as well as HTN. He was BIBEMS earlier today with respiratory distress. Initially responded to NIPPV, nitro gtt, and lasix, but with worsening respiratory status requiring intubation. Other labs notable for BNP 2800, hsTn 170 -> 718 -> >4000, and pH 7.1 with HCO3 14 and lactate 4.5.   CT PE performed and notable for bilateral pleural effusions and pulmonary edema. 3V coronary calcifications noted. Bedside echo performed in the ER tonight with severely reduced LVEF of unclear chronicity.   Past Medical History:  Diagnosis Date  . Carotid artery occlusion   . Hypertension     No past surgical history on file.   Home Medications:  Prior to Admission medications   Medication Sig Start Date End Date Taking? Authorizing Provider  albuterol (PROAIR HFA) 108 (90 Base) MCG/ACT inhaler Inhale 2 puffs into the lungs every 4 (four) hours as needed for wheezing or shortness of breath.    Yes [provider]  amLODipine (NORVASC) 5 MG tablet Take 5 mg by mouth daily. 12/04/17  Yes [provider]  aspirin EC 81 MG tablet Take 81 mg by mouth daily.   Yes [provider]  atorvastatin (LIPITOR) 40 MG tablet Take 40 mg by mouth at bedtime. 03/06/19  Yes [provider]  Azelastine HCl 137 MCG/SPRAY SOLN Place 1 spray into both nostrils 2 (two)  times daily as needed (for allergic symptoms or rhinitis).  03/28/19  Yes [provider]  predniSONE (DELTASONE) 20 MG tablet Take 20-60 mg by mouth See admin instructions. Take 60 mg by mouth once a day for 3 days, 40 mg once a day for 3 days, then 20 mg once a day for 3 days 05/15/19  Yes [provider]  SPIRIVA RESPIMAT 1.25 MCG/ACT AERS Inhale 2 puffs into the lungs daily. 04/23/19  Yes [provider]  hydrochlorothiazide (HYDRODIURIL) 12.5 MG tablet Take 12.5 mg by mouth daily.    [provider]  lisinopril (PRINIVIL,ZESTRIL) 20 MG tablet Take 1 tablet (20 mg total) by mouth daily. Patient not taking: Reported on 05/01/2019 11/28/13   Graylon Good, PA-C    Inpatient Medications: Scheduled Meds: . aspirin      . [START ON 05/06/2019] aspirin  81 mg Per Tube Daily  . heparin      . insulin aspart  0-15 Units Subcutaneous Q4H  . pantoprazole (PROTONIX) IV  40 mg Intravenous QHS  . sodium bicarbonate  100 mEq Intravenous Once   Continuous Infusions: . sodium chloride 20 mL/hr at 05/08/2019 1614  . ceFEPime (MAXIPIME) IV    . propofol (DIPRIVAN) infusion 5 mcg/kg/min (05/12/2019 1854)  . propofol    . vancomycin    . [START ON 05/06/2019] vancomycin     PRN Meds: fentaNYL (SUBLIMAZE) injection, fentaNYL (SUBLIMAZE) injection  Allergies:   No Known Allergies  Social History:   Social History   Socioeconomic  History  . Marital status: Divorced    Spouse name: Not on file  . Number of children: 2  . Years of education: Not on file  . Highest education level: Not on file  Occupational History  . Not on file  Social Needs  . Financial resource strain: Not on file  . Food insecurity    Worry: Not on file    Inability: Not on file  . Transportation needs    Medical: Not on file    Non-medical: Not on file  Tobacco Use  . Smoking status: Light Tobacco Smoker    Packs/day: 0.00    Years: 40.00    Pack years: 0.00    Types: Cigarettes     Last attempt to quit: 10/23/2017    Years since quitting: 1.5  . Smokeless tobacco: Never Used  Substance and Sexual Activity  . Alcohol use: Yes    Alcohol/week: 6.0 - 12.0 standard drinks    Types: 6 - 12 Cans of beer per week  . Drug use: No  . Sexual activity: Yes  Lifestyle  . Physical activity    Days per week: Not on file    Minutes per session: Not on file  . Stress: Not on file  Relationships  . Social Musician on phone: Not on file    Gets together: Not on file    Attends religious service: Not on file    Active member of club or organization: Not on file    Attends meetings of clubs or organizations: Not on file    Relationship status: Not on file  . Intimate partner violence    Fear of current or ex partner: Not on file    Emotionally abused: Not on file    Physically abused: Not on file    Forced sexual activity: Not on file  Other Topics Concern  . Not on file  Social History Narrative  . Not on file    Family History:   Unable to obtain  No ROS obtained as patient intubated and sedated.   Physical Exam/Data:   Vitals:   05/04/2019 2000 05/02/2019 2015 05/01/2019 2030 04/26/2019 2045  BP:  130/84 130/84 132/80  Pulse: 98 95 92 90  Resp: (!) 23 20 20 20   Temp:      TempSrc:      SpO2: 92% 91% 94% 93%  Weight:   72.6 kg   Height:   5\' 10"  (1.778 m)    No intake or output data in the 24 hours ending 05/02/2019 2104 Filed Weights   05/14/2019 1600 05/10/2019 2030  Weight: 72.6 kg 72.6 kg   Body mass index is 22.97 kg/m.  General:  Intubated and sedated.  HEENT: normal Lymph: no adenopathy Neck: JVD to mid neck.  Endocrine:  No thryomegaly Vascular: No carotid bruits; FA pulses 2+ bilaterally without bruits  Cardiac:  normal S1, S2; RRR; no murmur.  Lungs: rhonchi bilaterally. Mechanical breath sounds.   Abd: soft, nontender, no hepatomegaly  Ext: no edema. Cold extremities. Mottled feet.  Musculoskeletal:  No deformities, BUE and BLE strength  normal and equal Skin: warm and dry  Neuro:  Intubated and sedated.   EKG:  The EKG was personally reviewed and demonstrates LBBB with multifocal PVCs (no prior available).    Relevant CV Studies: IMPRESSION: Color duplex indicates minimal heterogeneous and calcified plaque, with no hemodynamically significant stenosis by duplex criteria in the extracranial cerebrovascular circulation.  Systolic reversal of  the bilateral vertebral arteries, with antegrade flow in diastole. The waveform suggests stenosis of the innominate artery or proximal subclavian artery on the right, and a proximal left subclavian artery stenosis. If warranted, CT angiogram of the chest may be useful.  Laboratory Data:  Chemistry Recent Labs  Lab 04/29/2019 1515 05/23/2019 1834 05/11/2019 1929  NA 132* 131* 131*  K 4.2 4.3 5.1  CL 103 102  --   CO2 15* 14*  --   GLUCOSE 149* 345*  --   BUN 14 16  --   CREATININE 1.34* 1.96*  --   CALCIUM 9.9 9.4  --   GFRNONAA 54* 34*  --   GFRAA >60 39*  --   ANIONGAP 14 15  --     Recent Labs  Lab 04/28/2019 1834  PROT 7.8  ALBUMIN 3.6  AST 33  ALT 23  ALKPHOS 108  BILITOT 1.4*   Hematology Recent Labs  Lab 04/29/2019 1515 05/01/2019 1834 04/26/2019 1929  WBC 16.9* 19.6*  --   RBC 5.01 5.65  --   HGB 15.0 17.2* 19.7*  HCT 45.0 53.3* 58.0*  MCV 89.8 94.3  --   MCH 29.9 30.4  --   MCHC 33.3 32.3  --   RDW 13.5 13.5  --   PLT 200 213  --    Cardiac EnzymesNo results for input(s): TROPONINI in the last 168 hours. No results for input(s): TROPIPOC in the last 168 hours.  BNP Recent Labs  Lab 05/15/2019 1739  BNP 2,725.3*    DDimer No results for input(s): DDIMER in the last 168 hours.  Radiology/Studies:  Ct Angio Chest Pe W And/or Wo Contrast  Result Date: 04/26/2019 CLINICAL DATA:  Shortness of breath. EXAM: CT ANGIOGRAPHY CHEST WITH CONTRAST TECHNIQUE: Multidetector CT imaging of the chest was performed using the standard protocol during bolus  administration of intravenous contrast. Multiplanar CT image reconstructions and MIPs were obtained to evaluate the vascular anatomy. CONTRAST:  64mL OMNIPAQUE IOHEXOL 350 MG/ML SOLN COMPARISON:  12/05/2017 FINDINGS: Cardiovascular: The main pulmonary artery is patent. There is no central obstructing or saddle embolus identified. No lobar pulmonary artery filling defects for segmental pulmonary artery filling defects identified. Normal heart size. No pericardial effusion. Aortic atherosclerosis. 3 vessel coronary artery calcifications. Mediastinum/Nodes: The endotracheal tube tip is above the carina. There is a nasogastric tube with tip in the stomach. No supraclavicular, axillary or mediastinal adenopathy. Diffuse increased soft tissue within bilateral hilar regions identified, new from previous exam. For example, within the right hilar region there is increased soft tissue measuring 3.7 x 2.9 cm. Within the left suprahilar region there is increased soft tissue measuring 3.1 x 2.0 cm. Lungs/Pleura: There is dense bilateral lower lobe posterior airspace consolidation. There is also bilateral upper lobe posterior subpleural consolidation. Diffuse interlobular septal thickening with areas of ground-glass attenuation identified compatible with pulmonary edema. Upper Abdomen: No acute abnormality. Musculoskeletal: No chest wall abnormality. No acute or significant osseous findings. Review of the MIP images confirms the above findings. IMPRESSION: 1. No evidence for acute pulmonary embolus. 2. Bilateral lower lobe posterior subpleural consolidation is identified compatible with multifocal pneumonia. 3. Diffuse interlobular septal thickening, ground-glass attenuation and areas of ground-glass attenuation compatible with pulmonary edema which may be secondary to heart failure 4. Interval development of bilateral hilar adenopathy which may be reactive in etiology. Differential considerations include granulomatous  inflammation/infection, lymphoproliferative disorder or metastatic adenopathy. This is may also be nonspecific in the setting of CHF. Advise follow-up imaging  in 3 months with repeat CT of the chest with contrast. This recommendation follows ACR consensus guidelines: Managing Incidental Findings on Thoracic CT: Mediastinal and Cardiovascular Findings. A White Paper of the ACR Incidental Findings Committee. J Am Coll Radiol. 2018; 15: 8242-3536. 5. 3 vessel coronary artery calcifications noted. Aortic Atherosclerosis (ICD10-I70.0). Electronically Signed   By: Kerby Moors M.D.   On: 04/27/2019 20:18   Dg Chest Portable 1 View  Result Date: 05/19/2019 CLINICAL DATA:  Intubation. Image 2 after pulled back. EXAM: PORTABLE CHEST 1 VIEW COMPARISON:  05/02/2019 FINDINGS: Endotracheal tube has been placed. On the initial image the tube is 3.6 centimeters above the carina. On the second image, tube is approximately 5.4 centimeters above the carina. Nasogastric tube is identified, tip beyond the level of the proximal stomach. Heart size is normal. There are hazy infiltrates throughout the lungs bilaterally, slightly increased since the prior study. Opacity at the LEFT lung base is more confluent. IMPRESSION: 1. Interval placement of endotracheal tube. 2. Increased bilateral infiltrates. 3. More confluent opacity at the LEFT lung base. Electronically Signed   By: Nolon Nations M.D.   On: 05/13/2019 18:57   Dg Chest Port 1 View  Result Date: 04/27/2019 CLINICAL DATA:  Chest pain. EXAM: PORTABLE CHEST 1 VIEW COMPARISON:  September 18, 2018. FINDINGS: The heart size and mediastinal contours are within normal limits. No pneumothorax or pleural effusion is noted. Multiple small ill-defined interstitial densities are noted throughout both lungs which may represent focal inflammation. The visualized skeletal structures are unremarkable. IMPRESSION: Multiple small ill-defined interstitial densities are noted throughout  both lungs which potentially may represent multifocal inflammation or possibly edema. Electronically Signed   By: Marijo Conception M.D.   On: 05/15/2019 16:15    Assessment and Plan:   Raymond Chavez is a 68 year old gentleman with known CAD and HTN who presents with 2-3 weeks of worsening shortness of breath found to be in shock with leukocytosis, hypothermia elevated pro-BNP, lactic acidosis to 4.5, and AKI. PICC placed in 2H for central axis with initial sat > 80% and CVP 6 s/p diuresis, seemingly more consistent with vasodilatory process / sepsis than cardiogenic etiology, with perhaps multifocal PNA being most to blame.   #Mixed Shock: Septic > Cardiogenic -- s/p Lasix 60mg  IV x 1. Would follow CVP to determine if additional diuresis is needed. Will need to balance fluid resuscitation with respiratory status, though PaO2 is improving.  -- Place foley for accurate I/Os.  -- PICC in place. Can follow co-oximetry.  -- On norepinephrine for SBP > 90. Can add back inotropic support if shock picture evolves.  -- Formal echo in the AM (ordered STAT) -- Hold home lisinopril given AKI; favor initiation of Entresto pending improvement in renal function.   #CAD s/p remote MI, abnormal troponin -- Agree with heparin gtt for ACS  -- Continue ASA 81mg  daily, increase home statin to high dose -- Keep NPO @ MN for possible RHC/LHC pending improvement in volume status, renal funciton, shock.  -- Send lipid panel, HbA1c to risk stratify.   #Leukocytosis -- Likely stress response plus outpatient steroids.  -- Agree with abx coverage given lactic acidosis.    #Acute Hypoxemic Respiratory Failure -- Likely secondary to pulmonary edema  +/- multifocal PNA.  -- Appreciate CCM management.  -- Agree with broad spectrum anitbiotics.     For questions or updates, please contact [facility] Please consult [email or web address] for contact info under   Signed, Milus Banister,  MD  05/14/2019 9:04 PM

## 2019-05-16 NOTE — ED Triage Notes (Signed)
Pt here for evaluation of L sided aching chest pain without radiation with worsening shortness of breath. Stent placement 1998. Pt being treated with nasal sprays and inhalers by PCP for ongoing shortness of breath.

## 2019-05-17 ENCOUNTER — Inpatient Hospital Stay (HOSPITAL_COMMUNITY): Payer: PPO

## 2019-05-17 ENCOUNTER — Encounter (HOSPITAL_COMMUNITY): Payer: Self-pay | Admitting: Cardiology

## 2019-05-17 ENCOUNTER — Encounter (HOSPITAL_COMMUNITY): Admission: EM | Disposition: E | Payer: Self-pay | Source: Home / Self Care | Attending: Cardiothoracic Surgery

## 2019-05-17 DIAGNOSIS — I25110 Atherosclerotic heart disease of native coronary artery with unstable angina pectoris: Secondary | ICD-10-CM

## 2019-05-17 DIAGNOSIS — R570 Cardiogenic shock: Secondary | ICD-10-CM

## 2019-05-17 DIAGNOSIS — J9601 Acute respiratory failure with hypoxia: Secondary | ICD-10-CM | POA: Diagnosis not present

## 2019-05-17 DIAGNOSIS — I351 Nonrheumatic aortic (valve) insufficiency: Secondary | ICD-10-CM

## 2019-05-17 DIAGNOSIS — I340 Nonrheumatic mitral (valve) insufficiency: Secondary | ICD-10-CM

## 2019-05-17 DIAGNOSIS — Z95811 Presence of heart assist device: Secondary | ICD-10-CM | POA: Diagnosis not present

## 2019-05-17 DIAGNOSIS — J9600 Acute respiratory failure, unspecified whether with hypoxia or hypercapnia: Secondary | ICD-10-CM

## 2019-05-17 DIAGNOSIS — J9690 Respiratory failure, unspecified, unspecified whether with hypoxia or hypercapnia: Secondary | ICD-10-CM

## 2019-05-17 HISTORY — PX: RIGHT/LEFT HEART CATH AND CORONARY ANGIOGRAPHY: CATH118266

## 2019-05-17 HISTORY — PX: VENTRICULAR ASSIST DEVICE INSERTION: CATH118273

## 2019-05-17 LAB — COMPREHENSIVE METABOLIC PANEL
ALT: 40 U/L (ref 0–44)
AST: 70 U/L — ABNORMAL HIGH (ref 15–41)
Albumin: 3.2 g/dL — ABNORMAL LOW (ref 3.5–5.0)
Alkaline Phosphatase: 86 U/L (ref 38–126)
Anion gap: 11 (ref 5–15)
BUN: 22 mg/dL (ref 8–23)
CO2: 16 mmol/L — ABNORMAL LOW (ref 22–32)
Calcium: 8.5 mg/dL — ABNORMAL LOW (ref 8.9–10.3)
Chloride: 107 mmol/L (ref 98–111)
Creatinine, Ser: 2.08 mg/dL — ABNORMAL HIGH (ref 0.61–1.24)
GFR calc Af Amer: 37 mL/min — ABNORMAL LOW (ref >60)
GFR calc non Af Amer: 32 mL/min — ABNORMAL LOW (ref >60)
Glucose, Bld: 144 mg/dL — ABNORMAL HIGH (ref 70–99)
Potassium: 5.1 mmol/L (ref 3.5–5.1)
Sodium: 134 mmol/L — ABNORMAL LOW (ref 135–145)
Total Bilirubin: 1.8 mg/dL — ABNORMAL HIGH (ref 0.3–1.2)
Total Protein: 6.7 g/dL (ref 6.5–8.1)

## 2019-05-17 LAB — RESPIRATORY PANEL BY PCR

## 2019-05-17 LAB — POCT I-STAT 7, (LYTES, BLD GAS, ICA,H+H)
Acid-base deficit: 12 mmol/L — ABNORMAL HIGH (ref 0.0–2.0)
Acid-base deficit: 11 mmol/L — ABNORMAL HIGH (ref 0.0–2.0)
Acid-base deficit: 6 mmol/L — ABNORMAL HIGH (ref 0.0–2.0)
Bicarbonate: 15.2 mmol/L — ABNORMAL LOW (ref 20.0–28.0)
Bicarbonate: 14.4 mmol/L — ABNORMAL LOW (ref 20.0–28.0)
Bicarbonate: 19.2 mmol/L — ABNORMAL LOW (ref 20.0–28.0)
Calcium, Ion: 1.23 mmol/L (ref 1.15–1.40)
Calcium, Ion: 1.21 mmol/L (ref 1.15–1.40)
Calcium, Ion: 1.11 mmol/L — ABNORMAL LOW (ref 1.15–1.40)
HCT: 51 % (ref 39.0–52.0)
HCT: 52 % (ref 39.0–52.0)
HCT: 43 % (ref 39.0–52.0)
Hemoglobin: 17.3 g/dL — ABNORMAL HIGH (ref 13.0–17.0)
Hemoglobin: 17.7 g/dL — ABNORMAL HIGH (ref 13.0–17.0)
Hemoglobin: 14.6 g/dL (ref 13.0–17.0)
O2 Saturation: 96 %
O2 Saturation: 95 %
O2 Saturation: 98 %
Patient temperature: 36.1
Patient temperature: 37.6
Patient temperature: 37.4
Potassium: 4.4 mmol/L (ref 3.5–5.1)
Potassium: 5.8 mmol/L — ABNORMAL HIGH (ref 3.5–5.1)
Potassium: 5.3 mmol/L — ABNORMAL HIGH (ref 3.5–5.1)
Sodium: 134 mmol/L — ABNORMAL LOW (ref 135–145)
Sodium: 134 mmol/L — ABNORMAL LOW (ref 135–145)
Sodium: 136 mmol/L (ref 135–145)
TCO2: 16 mmol/L — ABNORMAL LOW (ref 22–32)
TCO2: 15 mmol/L — ABNORMAL LOW (ref 22–32)
TCO2: 20 mmol/L — ABNORMAL LOW (ref 22–32)
pCO2 arterial: 35.2 mmHg (ref 32.0–48.0)
pCO2 arterial: 32.8 mmHg (ref 32.0–48.0)
pCO2 arterial: 37.5 mmHg (ref 32.0–48.0)
pH, Arterial: 7.237 — ABNORMAL LOW (ref 7.350–7.450)
pH, Arterial: 7.255 — ABNORMAL LOW (ref 7.350–7.450)
pH, Arterial: 7.319 — ABNORMAL LOW (ref 7.350–7.450)
pO2, Arterial: 92 mmHg (ref 83.0–108.0)
pO2, Arterial: 91 mmHg (ref 83.0–108.0)
pO2, Arterial: 109 mmHg — ABNORMAL HIGH (ref 83.0–108.0)

## 2019-05-17 LAB — BASIC METABOLIC PANEL WITH GFR
Anion gap: 12 (ref 5–15)
BUN: 34 mg/dL — ABNORMAL HIGH (ref 8–23)
CO2: 17 mmol/L — ABNORMAL LOW (ref 22–32)
Calcium: 7.9 mg/dL — ABNORMAL LOW (ref 8.9–10.3)
Chloride: 105 mmol/L (ref 98–111)
Creatinine, Ser: 2.48 mg/dL — ABNORMAL HIGH (ref 0.61–1.24)
GFR calc Af Amer: 30 mL/min — ABNORMAL LOW
GFR calc non Af Amer: 25 mL/min — ABNORMAL LOW
Glucose, Bld: 162 mg/dL — ABNORMAL HIGH (ref 70–99)
Potassium: 5.4 mmol/L — ABNORMAL HIGH (ref 3.5–5.1)
Sodium: 134 mmol/L — ABNORMAL LOW (ref 135–145)

## 2019-05-17 LAB — CBC WITH DIFFERENTIAL/PLATELET
Abs Immature Granulocytes: 0.13 10*3/uL — ABNORMAL HIGH (ref 0.00–0.07)
Abs Immature Granulocytes: 0.09 10*3/uL — ABNORMAL HIGH (ref 0.00–0.07)
Basophils Absolute: 0 10*3/uL (ref 0.0–0.1)
Basophils Absolute: 0 10*3/uL (ref 0.0–0.1)
Basophils Relative: 0 %
Basophils Relative: 0 %
Eosinophils Absolute: 0 10*3/uL (ref 0.0–0.5)
Eosinophils Absolute: 0 10*3/uL (ref 0.0–0.5)
Eosinophils Relative: 0 %
Eosinophils Relative: 0 %
HCT: 51.1 % (ref 39.0–52.0)
HCT: 45.8 % (ref 39.0–52.0)
Hemoglobin: 16.3 g/dL (ref 13.0–17.0)
Hemoglobin: 14.7 g/dL (ref 13.0–17.0)
Immature Granulocytes: 1 %
Immature Granulocytes: 1 %
Lymphocytes Relative: 8 %
Lymphocytes Relative: 8 %
Lymphs Abs: 1.6 10*3/uL (ref 0.7–4.0)
Lymphs Abs: 1.2 10*3/uL (ref 0.7–4.0)
MCH: 29.7 pg (ref 26.0–34.0)
MCH: 30.2 pg (ref 26.0–34.0)
MCHC: 31.9 g/dL (ref 30.0–36.0)
MCHC: 32.1 g/dL (ref 30.0–36.0)
MCV: 93.1 fL (ref 80.0–100.0)
MCV: 94.2 fL (ref 80.0–100.0)
Monocytes Absolute: 1.2 10*3/uL — ABNORMAL HIGH (ref 0.1–1.0)
Monocytes Absolute: 1.3 10*3/uL — ABNORMAL HIGH (ref 0.1–1.0)
Monocytes Relative: 6 %
Monocytes Relative: 8 %
Neutro Abs: 17.8 10*3/uL — ABNORMAL HIGH (ref 1.7–7.7)
Neutro Abs: 13.3 10*3/uL — ABNORMAL HIGH (ref 1.7–7.7)
Neutrophils Relative %: 85 %
Neutrophils Relative %: 83 %
Platelets: 204 10*3/uL (ref 150–400)
Platelets: 149 10*3/uL — ABNORMAL LOW (ref 150–400)
RBC: 5.49 MIL/uL (ref 4.22–5.81)
RBC: 4.86 MIL/uL (ref 4.22–5.81)
RDW: 13.8 % (ref 11.5–15.5)
RDW: 13.7 % (ref 11.5–15.5)
WBC: 20.8 10*3/uL — ABNORMAL HIGH (ref 4.0–10.5)
WBC: 15.9 10*3/uL — ABNORMAL HIGH (ref 4.0–10.5)
nRBC: 0 % (ref 0.0–0.2)
nRBC: 0 % (ref 0.0–0.2)

## 2019-05-17 LAB — BASIC METABOLIC PANEL
Anion gap: 13 (ref 5–15)
Anion gap: 11 (ref 5–15)
Anion gap: 15 (ref 5–15)
BUN: 26 mg/dL — ABNORMAL HIGH (ref 8–23)
BUN: 30 mg/dL — ABNORMAL HIGH (ref 8–23)
BUN: 37 mg/dL — ABNORMAL HIGH (ref 8–23)
CO2: 16 mmol/L — ABNORMAL LOW (ref 22–32)
CO2: 17 mmol/L — ABNORMAL LOW (ref 22–32)
CO2: 23 mmol/L (ref 22–32)
Calcium: 8.2 mg/dL — ABNORMAL LOW (ref 8.9–10.3)
Calcium: 7.8 mg/dL — ABNORMAL LOW (ref 8.9–10.3)
Calcium: 9.2 mg/dL (ref 8.9–10.3)
Chloride: 109 mmol/L (ref 98–111)
Chloride: 105 mmol/L (ref 98–111)
Chloride: 101 mmol/L (ref 98–111)
Creatinine, Ser: 2.31 mg/dL — ABNORMAL HIGH (ref 0.61–1.24)
Creatinine, Ser: 2.23 mg/dL — ABNORMAL HIGH (ref 0.61–1.24)
Creatinine, Ser: 2.49 mg/dL — ABNORMAL HIGH (ref 0.61–1.24)
GFR calc Af Amer: 32 mL/min — ABNORMAL LOW (ref >60)
GFR calc Af Amer: 34 mL/min — ABNORMAL LOW (ref >60)
GFR calc Af Amer: 29 mL/min — ABNORMAL LOW (ref >60)
GFR calc non Af Amer: 28 mL/min — ABNORMAL LOW (ref >60)
GFR calc non Af Amer: 29 mL/min — ABNORMAL LOW (ref >60)
GFR calc non Af Amer: 25 mL/min — ABNORMAL LOW (ref >60)
Glucose, Bld: 143 mg/dL — ABNORMAL HIGH (ref 70–99)
Glucose, Bld: 128 mg/dL — ABNORMAL HIGH (ref 70–99)
Glucose, Bld: 162 mg/dL — ABNORMAL HIGH (ref 70–99)
Potassium: 5.7 mmol/L — ABNORMAL HIGH (ref 3.5–5.1)
Potassium: 5.5 mmol/L — ABNORMAL HIGH (ref 3.5–5.1)
Potassium: 4 mmol/L (ref 3.5–5.1)
Sodium: 138 mmol/L (ref 135–145)
Sodium: 133 mmol/L — ABNORMAL LOW (ref 135–145)
Sodium: 139 mmol/L (ref 135–145)

## 2019-05-17 LAB — PROTIME-INR
INR: 1.7 — ABNORMAL HIGH (ref 0.8–1.2)
Prothrombin Time: 20 seconds — ABNORMAL HIGH (ref 11.4–15.2)

## 2019-05-17 LAB — POCT I-STAT EG7
Acid-base deficit: 9 mmol/L — ABNORMAL HIGH (ref 0.0–2.0)
Acid-base deficit: 9 mmol/L — ABNORMAL HIGH (ref 0.0–2.0)
Bicarbonate: 18.2 mmol/L — ABNORMAL LOW (ref 20.0–28.0)
Bicarbonate: 18.4 mmol/L — ABNORMAL LOW (ref 20.0–28.0)
Calcium, Ion: 1.14 mmol/L — ABNORMAL LOW (ref 1.15–1.40)
Calcium, Ion: 1.16 mmol/L (ref 1.15–1.40)
HCT: 49 % (ref 39.0–52.0)
HCT: 50 % (ref 39.0–52.0)
Hemoglobin: 16.7 g/dL (ref 13.0–17.0)
Hemoglobin: 17 g/dL (ref 13.0–17.0)
O2 Saturation: 59 %
O2 Saturation: 60 %
Potassium: 5.4 mmol/L — ABNORMAL HIGH (ref 3.5–5.1)
Potassium: 5.4 mmol/L — ABNORMAL HIGH (ref 3.5–5.1)
Sodium: 137 mmol/L (ref 135–145)
Sodium: 137 mmol/L (ref 135–145)
TCO2: 19 mmol/L — ABNORMAL LOW (ref 22–32)
TCO2: 20 mmol/L — ABNORMAL LOW (ref 22–32)
pCO2, Ven: 41.9 mmHg — ABNORMAL LOW (ref 44.0–60.0)
pCO2, Ven: 42.1 mmHg — ABNORMAL LOW (ref 44.0–60.0)
pH, Ven: 7.245 — ABNORMAL LOW (ref 7.250–7.430)
pH, Ven: 7.248 — ABNORMAL LOW (ref 7.250–7.430)
pO2, Ven: 35 mmHg (ref 32.0–45.0)
pO2, Ven: 37 mmHg (ref 32.0–45.0)

## 2019-05-17 LAB — COOXEMETRY PANEL
Carboxyhemoglobin: 1.1 % (ref 0.5–1.5)
Carboxyhemoglobin: 1.1 % (ref 0.5–1.5)
Carboxyhemoglobin: 1.1 % (ref 0.5–1.5)
Carboxyhemoglobin: 1.2 % (ref 0.5–1.5)
Methemoglobin: 1.2 % (ref 0.0–1.5)
Methemoglobin: 1.2 % (ref 0.0–1.5)
Methemoglobin: 1.2 % (ref 0.0–1.5)
Methemoglobin: 1.2 % (ref 0.0–1.5)
O2 Saturation: 70.6 %
O2 Saturation: 72.1 %
O2 Saturation: 73.9 %
O2 Saturation: 81.9 %
Total hemoglobin: 15.8 g/dL (ref 12.0–16.0)
Total hemoglobin: 16.6 g/dL — ABNORMAL HIGH (ref 12.0–16.0)
Total hemoglobin: 16.6 g/dL — ABNORMAL HIGH (ref 12.0–16.0)
Total hemoglobin: 17.7 g/dL — ABNORMAL HIGH (ref 12.0–16.0)

## 2019-05-17 LAB — LACTIC ACID, PLASMA
Lactic Acid, Venous: 4.7 mmol/L (ref 0.5–1.9)
Lactic Acid, Venous: 2.5 mmol/L (ref 0.5–1.9)
Lactic Acid, Venous: 2.3 mmol/L (ref 0.5–1.9)
Lactic Acid, Venous: 2.1 mmol/L (ref 0.5–1.9)

## 2019-05-17 LAB — CBC
HCT: 49.2 % (ref 39.0–52.0)
Hemoglobin: 16 g/dL (ref 13.0–17.0)
MCH: 30.1 pg (ref 26.0–34.0)
MCHC: 32.5 g/dL (ref 30.0–36.0)
MCV: 92.5 fL (ref 80.0–100.0)
Platelets: 193 10*3/uL (ref 150–400)
RBC: 5.32 MIL/uL (ref 4.22–5.81)
RDW: 13.6 % (ref 11.5–15.5)
WBC: 22.2 10*3/uL — ABNORMAL HIGH (ref 4.0–10.5)
nRBC: 0 % (ref 0.0–0.2)

## 2019-05-17 LAB — HEPARIN LEVEL (UNFRACTIONATED): Heparin Unfractionated: 0.17 IU/mL — ABNORMAL LOW (ref 0.30–0.70)

## 2019-05-17 LAB — TSH: TSH: 7.401 u[IU]/mL — ABNORMAL HIGH (ref 0.350–4.500)

## 2019-05-17 LAB — ECHOCARDIOGRAM COMPLETE
Height: 67 in
Height: 67 in
Weight: 2486.79 oz
Weight: 2486.79 oz

## 2019-05-17 LAB — POCT ACTIVATED CLOTTING TIME
Activated Clotting Time: 213 seconds
Activated Clotting Time: 230 seconds
Activated Clotting Time: 175 seconds
Activated Clotting Time: 147 s
Activated Clotting Time: 142 s
Activated Clotting Time: 136 s
Activated Clotting Time: 142 seconds
Activated Clotting Time: 147 seconds

## 2019-05-17 LAB — URINE CULTURE: Culture: NO GROWTH

## 2019-05-17 LAB — ECHOCARDIOGRAM LIMITED
Height: 67 in
Weight: 2486.79 [oz_av]

## 2019-05-17 LAB — BRAIN NATRIURETIC PEPTIDE: B Natriuretic Peptide: 3370 pg/mL — ABNORMAL HIGH (ref 0.0–100.0)

## 2019-05-17 LAB — STREP PNEUMONIAE URINARY ANTIGEN: Strep Pneumo Urinary Antigen: NEGATIVE

## 2019-05-17 LAB — TROPONIN I (HIGH SENSITIVITY): Troponin I (High Sensitivity): 4254 ng/L (ref <18)

## 2019-05-17 LAB — GLUCOSE, CAPILLARY
Glucose-Capillary: 226 mg/dL — ABNORMAL HIGH (ref 70–99)
Glucose-Capillary: 130 mg/dL — ABNORMAL HIGH (ref 70–99)
Glucose-Capillary: 146 mg/dL — ABNORMAL HIGH (ref 70–99)
Glucose-Capillary: 134 mg/dL — ABNORMAL HIGH (ref 70–99)

## 2019-05-17 LAB — PROCALCITONIN: Procalcitonin: 5.55 ng/mL

## 2019-05-17 LAB — PHOSPHORUS
Phosphorus: 4.3 mg/dL (ref 2.5–4.6)
Phosphorus: 5.5 mg/dL — ABNORMAL HIGH (ref 2.5–4.6)
Phosphorus: 5.8 mg/dL — ABNORMAL HIGH (ref 2.5–4.6)

## 2019-05-17 LAB — TRIGLYCERIDES: Triglycerides: 68 mg/dL (ref <150)

## 2019-05-17 LAB — MRSA PCR SCREENING: MRSA by PCR: NEGATIVE

## 2019-05-17 LAB — MAGNESIUM
Magnesium: 2.1 mg/dL (ref 1.7–2.4)
Magnesium: 2 mg/dL (ref 1.7–2.4)
Magnesium: 2 mg/dL (ref 1.7–2.4)

## 2019-05-17 LAB — APTT: aPTT: 52 seconds — ABNORMAL HIGH (ref 24–36)

## 2019-05-17 SURGERY — RIGHT/LEFT HEART CATH AND CORONARY ANGIOGRAPHY
Anesthesia: LOCAL

## 2019-05-17 MED ORDER — MIDAZOLAM 50MG/50ML (1MG/ML) PREMIX INFUSION
1.0000 mg/h | INTRAVENOUS | Status: DC
  Administered 2019-05-17: 12:00:00 1 mg/h via INTRAVENOUS
  Administered 2019-05-18 – 2019-05-19 (×3): 3 mg/h via INTRAVENOUS
  Administered 2019-05-22 – 2019-05-23 (×2): 2 mg/h via INTRAVENOUS
  Administered 2019-05-24: 13:00:00 1 mg/h via INTRAVENOUS
  Administered 2019-05-25: 20:00:00 3 mg/h via INTRAVENOUS
  Filled 2019-05-17 (×10): qty 50

## 2019-05-17 MED ORDER — CALCIUM CHLORIDE 10 % IV SOLN
INTRAVENOUS | Status: AC
  Administered 2019-05-17: 19:00:00 1000 mg
  Filled 2019-05-17: qty 10

## 2019-05-17 MED ORDER — SODIUM CHLORIDE 0.9 % IV SOLN: 250.0000 mL | INTRAVENOUS | Status: DC | PRN

## 2019-05-17 MED ORDER — PATIROMER SORBITEX CALCIUM 8.4 G PO PACK
8.4000 g | PACK | Freq: Every day | ORAL | Status: DC
  Administered 2019-05-17: 13:00:00 8.4 g via ORAL
  Filled 2019-05-17: qty 1

## 2019-05-17 MED ORDER — MIDAZOLAM HCL 2 MG/2ML IJ SOLN
INTRAMUSCULAR | Status: DC | PRN
  Administered 2019-05-17 (×2): 1 mg via INTRAVENOUS

## 2019-05-17 MED ORDER — HEPARIN SODIUM (PORCINE) 5000 UNIT/ML IJ SOLN: 50000.0000 [IU] | INTRAVENOUS | Status: DC

## 2019-05-17 MED ORDER — FUROSEMIDE 10 MG/ML IJ SOLN
80.0000 mg | Freq: Once | INTRAMUSCULAR | Status: AC
  Administered 2019-05-17: 15:00:00 80 mg via INTRAVENOUS
  Filled 2019-05-17: qty 8

## 2019-05-17 MED ORDER — HEPARIN (PORCINE) 25000 UT/250ML-% IV SOLN
800.0000 [IU]/h | INTRAVENOUS | Status: DC
  Filled 2019-05-17: qty 250

## 2019-05-17 MED ORDER — SODIUM CHLORIDE 0.9% FLUSH: 3.0000 mL | INTRAVENOUS | Status: DC | PRN

## 2019-05-17 MED ORDER — LIDOCAINE HCL (PF) 1 % IJ SOLN
INTRAMUSCULAR | Status: DC | PRN
  Administered 2019-05-17: 16 mL

## 2019-05-17 MED ORDER — ATORVASTATIN CALCIUM 80 MG PO TABS
80.0000 mg | ORAL_TABLET | Freq: Every day | ORAL | Status: DC
  Administered 2019-05-17: 18:00:00 80 mg via ORAL
  Filled 2019-05-17: qty 1

## 2019-05-17 MED ORDER — MIDAZOLAM HCL 2 MG/2ML IJ SOLN
INTRAMUSCULAR | Status: AC
  Filled 2019-05-17: qty 2

## 2019-05-17 MED ORDER — MAGNESIUM SULFATE 2 GM/50ML IV SOLN
INTRAVENOUS | Status: AC
  Administered 2019-05-17: 19:00:00 2 g
  Filled 2019-05-17: qty 50

## 2019-05-17 MED ORDER — LIDOCAINE HCL (PF) 1 % IJ SOLN
INTRAMUSCULAR | Status: AC
  Filled 2019-05-17: qty 30

## 2019-05-17 MED ORDER — HEPARIN (PORCINE) IN NACL 1000-0.9 UT/500ML-% IV SOLN
INTRAVENOUS | Status: AC
  Filled 2019-05-17: qty 1000

## 2019-05-17 MED ORDER — ASPIRIN 81 MG PO CHEW: 81.0000 mg | CHEWABLE_TABLET | ORAL | Status: AC

## 2019-05-17 MED ORDER — SODIUM CHLORIDE 0.9 % IV SOLN
2.0000 g | INTRAVENOUS | Status: DC
  Administered 2019-05-18 – 2019-05-20 (×3): 2 g via INTRAVENOUS
  Filled 2019-05-17 (×4): qty 2

## 2019-05-17 MED ORDER — SODIUM CHLORIDE 0.9 % IV SOLN: INTRAVENOUS | Status: DC

## 2019-05-17 MED ORDER — SODIUM CHLORIDE 0.9 % IV SOLN
INTRAVENOUS | Status: DC | PRN
  Administered 2019-05-19 – 2019-06-02 (×3): via INTRAVENOUS

## 2019-05-17 MED ORDER — SODIUM CHLORIDE 0.9% FLUSH
3.0000 mL | Freq: Two times a day (BID) | INTRAVENOUS | Status: DC
  Administered 2019-05-18 – 2019-05-20 (×3): 3 mL via INTRAVENOUS

## 2019-05-17 MED ORDER — VANCOMYCIN HCL IN DEXTROSE 750-5 MG/150ML-% IV SOLN
750.0000 mg | INTRAVENOUS | Status: DC
  Administered 2019-05-18: 750 mg via INTRAVENOUS
  Filled 2019-05-17: qty 150

## 2019-05-17 MED ORDER — SODIUM BICARBONATE 8.4 % IV SOLN
50.0000 meq | Freq: Once | INTRAVENOUS | Status: AC
  Administered 2019-05-17: 19:00:00 50 meq via INTRAVENOUS

## 2019-05-17 MED ORDER — FUROSEMIDE 10 MG/ML IJ SOLN
160.0000 mg | Freq: Once | INTRAVENOUS | Status: AC
  Administered 2019-05-17: 18:00:00 160 mg via INTRAVENOUS
  Filled 2019-05-17: qty 10

## 2019-05-17 MED ORDER — HEPARIN SODIUM (PORCINE) 1000 UNIT/ML IJ SOLN
INTRAMUSCULAR | Status: DC | PRN
  Administered 2019-05-17: 5000 [IU] via INTRAVENOUS
  Administered 2019-05-17: 1000 [IU] via INTRAVENOUS

## 2019-05-17 MED ORDER — MILRINONE LACTATE IN DEXTROSE 20-5 MG/100ML-% IV SOLN
0.2500 ug/kg/min | INTRAVENOUS | Status: DC
  Administered 2019-05-17 – 2019-05-27 (×14): 0.25 ug/kg/min via INTRAVENOUS
  Filled 2019-05-17 (×15): qty 100

## 2019-05-17 MED ORDER — SODIUM BICARBONATE 8.4 % IV SOLN
INTRAVENOUS | Status: DC
  Administered 2019-05-17: 18:00:00 via INTRAVENOUS
  Filled 2019-05-17 (×2): qty 100

## 2019-05-17 MED ORDER — FUROSEMIDE 10 MG/ML IJ SOLN
INTRAMUSCULAR | Status: AC
  Filled 2019-05-17: qty 4

## 2019-05-17 MED ORDER — HEPARIN SODIUM (PORCINE) 5000 UNIT/ML IJ SOLN
50000.0000 [IU] | INTRAVENOUS | Status: DC
  Administered 2019-05-17 – 2019-05-20 (×3): 50000 [IU]
  Filled 2019-05-17 (×4): qty 10

## 2019-05-17 MED ORDER — SODIUM ZIRCONIUM CYCLOSILICATE 10 G PO PACK
10.0000 g | PACK | Freq: Three times a day (TID) | ORAL | Status: DC
  Filled 2019-05-17 (×2): qty 1

## 2019-05-17 MED ORDER — FUROSEMIDE 10 MG/ML IJ SOLN
INTRAMUSCULAR | Status: DC | PRN
  Administered 2019-05-17: 40 mg via INTRAVENOUS

## 2019-05-17 MED ORDER — NOREPINEPHRINE 4 MG/250ML-% IV SOLN
0.0000 ug/min | INTRAVENOUS | Status: DC
  Administered 2019-05-17: 01:00:00 2 ug/min via INTRAVENOUS
  Administered 2019-05-17: 5 ug/min via INTRAVENOUS
  Administered 2019-05-18: 20 ug/min via INTRAVENOUS
  Administered 2019-05-18: 08:00:00 8 ug/min via INTRAVENOUS
  Administered 2019-05-19: 15 ug/min via INTRAVENOUS
  Filled 2019-05-17 (×7): qty 250

## 2019-05-17 MED ORDER — CALCIUM GLUCONATE 10 % IV SOLN: 1.0000 g | Freq: Once | INTRAVENOUS | Status: DC

## 2019-05-17 MED ORDER — IOHEXOL 350 MG/ML SOLN
INTRAVENOUS | Status: DC | PRN
  Administered 2019-05-17: 12:00:00 20 mL

## 2019-05-17 MED ORDER — PATIROMER SORBITEX CALCIUM 8.4 G PO PACK
8.4000 g | PACK | Freq: Every day | ORAL | Status: DC
  Administered 2019-05-17: 17:00:00 8.4 g via ORAL
  Filled 2019-05-17: qty 1

## 2019-05-17 MED ORDER — VITAL AF 1.2 CAL PO LIQD
1000.0000 mL | ORAL | Status: DC
  Administered 2019-05-17 – 2019-05-20 (×4): 1000 mL

## 2019-05-17 MED ORDER — MAGNESIUM SULFATE 2 GM/50ML IV SOLN
2.0000 g | Freq: Once | INTRAVENOUS | Status: AC
  Administered 2019-05-17: 19:00:00 2 g via INTRAVENOUS

## 2019-05-17 MED ORDER — CALCIUM GLUCONATE-NACL 1-0.675 GM/50ML-% IV SOLN
1.0000 g | Freq: Once | INTRAVENOUS | Status: DC
  Filled 2019-05-17: qty 50

## 2019-05-17 MED ORDER — HEPARIN (PORCINE) IN NACL 1000-0.9 UT/500ML-% IV SOLN
INTRAVENOUS | Status: DC | PRN
  Administered 2019-05-17 (×2): 500 mL

## 2019-05-17 MED ORDER — SODIUM CHLORIDE 0.9 % IV SOLN: INTRAVENOUS | Status: DC | PRN

## 2019-05-17 MED ORDER — SODIUM BICARBONATE 8.4 % IV SOLN
50.0000 meq | Freq: Once | INTRAVENOUS | Status: AC
  Administered 2019-05-17: 07:00:00 50 meq via INTRAVENOUS

## 2019-05-17 MED ORDER — HEPARIN BOLUS VIA INFUSION
150.0000 [IU] | Freq: Once | INTRAVENOUS | Status: AC
  Administered 2019-05-17: 21:00:00 150 [IU] via INTRAVENOUS
  Filled 2019-05-17: qty 150

## 2019-05-17 SURGICAL SUPPLY — 13 items
CATH DXT MULTI JL4 JR4 ANG PIG (CATHETERS) ×1 IMPLANT
CATH SWAN GANZ 7F STRAIGHT (CATHETERS) ×1 IMPLANT
ELECT DEFIB PAD ADLT CADENCE (PAD) ×1 IMPLANT
HOVERMATT SINGLE USE (MISCELLANEOUS) ×1 IMPLANT
KIT HEART LEFT (KITS) ×1 IMPLANT
PACK CARDIAC CATHETERIZATION (CUSTOM PROCEDURE TRAY) ×2 IMPLANT
SET IMPELLA CP PUMP (CATHETERS) ×1 IMPLANT
SHEATH PINNACLE 5F 10CM (SHEATH) ×1 IMPLANT
SHEATH PINNACLE 7F 10CM (SHEATH) ×1 IMPLANT
SHEATH PROBE COVER 6X72 (BAG) ×1 IMPLANT
SLEEVE REPOSITIONING LENGTH 30 (MISCELLANEOUS) ×1 IMPLANT
TRANSDUCER W/STOPCOCK (MISCELLANEOUS) ×2 IMPLANT
WIRE EMERALD 3MM-J .035X150CM (WIRE) ×1 IMPLANT

## 2019-05-17 NOTE — Progress Notes (Signed)
RT obtained ABG on pt with the following results. RT will continue to monitor.   Results for MARKESE, BLOXHAM (MRN [id number]) as of 05/10/2019 01:14  Ref. Range 05/13/2019 01:11  Sample type Unknown ARTERIAL  pH, Arterial Latest Ref Range: 7.350 - 7.450  7.237 (L)  pCO2 arterial Latest Ref Range: 32.0 - 48.0 mmHg 35.2  pO2, Arterial Latest Ref Range: 83.0 - 108.0 mmHg 92.0  TCO2 Latest Ref Range: 22 - 32 mmol/L 16 (L)  Acid-base deficit Latest Ref Range: 0.0 - 2.0 mmol/L 12.0 (H)  Bicarbonate Latest Ref Range: 20.0 - 28.0 mmol/L 15.2 (L)  O2 Saturation Latest Units: % 96.0  Patient temperature Unknown 36.1 C  Collection site Unknown RADIAL, ALLEN'S TEST ACCEPTABLE

## 2019-05-17 NOTE — Progress Notes (Addendum)
Per RN - patient not responding to lasuix  PH 7.31, K 5.3, Buic 17 and persisent, creat 2.48 abd wirse  Plan - start bic gtt  - lokelema  - Recheck abg and bmet at 9pm - d/w renal Dr Juel Burrow 5:33 PM 05/19/2019 - > give 160mg  IV lasix x 1 -> no response in 2h -> then place HD cath     SIGNATURE    Dr. , M.D., F.C.C.P,  Pulmonary and Critical Care Medicine Staff Physician, [facility] Director - Interstitial Lung Disease  Program  Pulmonary Fibrosis [facility] at [place], [place], [place]  Pager: [phone], If no answer or between  15:00h - 7:00h: call [phone] Telephone: [phone]  5:22 PM 05/02/2019     LABS    PULMONARY Recent Labs  Lab 05/23/2019 1929  05/03/2019 0111  05/09/2019 0619 05/15/2019 1015 04/26/2019 1016 05/15/2019 1245 05/22/2019 1636  PHART 7.108*  --  7.237*  --  7.255*  --   --   --  7.319*  PCO2ART 44.5  --  35.2  --  32.8  --   --   --  37.5  PO2ART 101.0  --  92.0  --  91.0  --   --   --  109.0*  HCO3 14.2*  --  15.2*  --  14.4* 18.4* 18.2*  --  19.2*  TCO2 16*  --  16*  --  15* 20* 19*  --  20*  O2SAT 95.0   < > 96.0   < > 95.0 59.0 60.0 72.1 98.0   < > = values in this interval not displayed.    CBC Recent Labs  Lab 05/12/2019 0424  05/19/2019 0900  05/04/2019 1016 05/02/2019 1504 05/25/2019 1636  HGB 16.0   < > 16.3   < > 17.0 14.7 14.6  HCT 49.2   < > 51.1   < > 50.0 45.8 43.0  WBC 22.2*  --  20.8*  --   --  15.9*  --   PLT 193  --  204  --   --  149*  --    < > = values in this interval not displayed.    COAGULATION Recent Labs  Lab 05/10/2019 0022  INR 1.7*    CARDIAC  No results for input(s): TROPONINI in the last 168 hours. No results for input(s): PROBNP in the last 168 hours.   CHEMISTRY Recent Labs  Lab 05/01/2019 1834  05/10/2019 0424  05/02/2019 0900 04/29/2019 1015 05/10/2019 1016 04/29/2019 1234 04/28/2019 1504 04/30/2019 1600 05/21/2019 1636  NA 131*    < > 134*   < > 138 137 137 133*  --  134* 136  K 4.3   < > 5.1   < > 5.7* 5.4* 5.4* 5.5*  --  5.4* 5.3*  CL 102  --  107  --  109  --   --  105  --  105  --   CO2 14*  --  16*  --  16*  --   --  17*  --  17*  --   GLUCOSE 345*  --  144*  --  143*  --   --  128*  --  162*  --   BUN 16  --  22  --  26*  --   --  30*  --  34*  --   CREATININE 1.96*  --  2.08*  --  2.31*  --   --  2.23*  --  2.48*  --   CALCIUM 9.4  --  8.5*  --  8.2*  --   --  7.8*  --  7.9*  --   MG  --   --  2.1  --   --   --   --   --  2.0 2.0  --   PHOS  --   --  4.3  --   --   --   --   --  5.5* 5.8*  --    < > = values in this interval not displayed.   Estimated Creatinine Clearance: 26.3 mL/min (A) (by C-G formula based on SCr of 2.48 mg/dL (H)).   LIVER Recent Labs  Lab 04/25/2019 0022 05/19/2019 1834 05/11/2019 0424  AST  --  33 70*  ALT  --  23 40  ALKPHOS  --  108 86  BILITOT  --  1.4* 1.8*  PROT  --  7.8 6.7  ALBUMIN  --  3.6 3.2*  INR 1.7*  --   --      INFECTIOUS Recent Labs  Lab 05/22/2019 2325 05/05/2019 0424 04/26/2019 0900 05/16/2019 1503  LATICACIDVEN 4.7* 2.5*  --  2.3*  PROCALCITON  --   --  5.55  --      ENDOCRINE CBG (last 3)  Recent Labs    05/01/2019 0421 05/03/2019 0739 05/17/19 0850  GLUCAP 130* 146* 134*         IMAGING x48h  - image(s) personally visualized  -   highlighted in bold Ct Angio Chest Pe W And/or Wo Contrast  Result Date: 05/17/2019 CLINICAL DATA:  Shortness of breath. EXAM: CT ANGIOGRAPHY CHEST WITH CONTRAST TECHNIQUE: Multidetector CT imaging of the chest was performed using the standard protocol during bolus administration of intravenous contrast. Multiplanar CT image reconstructions and MIPs were obtained to evaluate the vascular anatomy. CONTRAST:  67mL OMNIPAQUE IOHEXOL 350 MG/ML SOLN COMPARISON:  12/05/2017 FINDINGS: Cardiovascular: The main pulmonary artery is patent. There is no central obstructing or saddle embolus identified. No lobar pulmonary artery  filling defects for segmental pulmonary artery filling defects identified. Normal heart size. No pericardial effusion. Aortic atherosclerosis. 3 vessel coronary artery calcifications. Mediastinum/Nodes: The endotracheal tube tip is above the carina. There is a nasogastric tube with tip in the stomach. No supraclavicular, axillary or mediastinal adenopathy. Diffuse increased soft tissue within bilateral hilar regions identified, new from previous exam. For example, within the right hilar region there is increased soft tissue measuring 3.7 x 2.9 cm. Within the left suprahilar region there is increased soft tissue measuring 3.1 x 2.0 cm. Lungs/Pleura: There is dense bilateral lower lobe posterior airspace consolidation. There is also bilateral upper lobe posterior subpleural consolidation. Diffuse interlobular septal thickening with areas of ground-glass attenuation identified compatible with pulmonary edema. Upper Abdomen: No acute abnormality. Musculoskeletal: No chest wall abnormality. No acute or significant osseous findings. Review of the MIP images confirms the above findings. IMPRESSION: 1. No evidence for acute pulmonary embolus. 2. Bilateral lower lobe posterior subpleural consolidation is identified compatible with multifocal pneumonia. 3. Diffuse interlobular septal thickening, ground-glass attenuation and areas of ground-glass attenuation compatible with pulmonary edema which may be secondary to heart failure 4. Interval development of bilateral hilar adenopathy which may be reactive in etiology. Differential considerations include granulomatous inflammation/infection, lymphoproliferative disorder or metastatic adenopathy. This is may also be nonspecific in the setting of CHF. Advise follow-up imaging in  3 months with repeat CT of the chest with contrast. This recommendation follows ACR consensus guidelines: Managing Incidental Findings on Thoracic CT: Mediastinal and Cardiovascular Findings. A White Paper  of the ACR Incidental Findings Committee. J Am Coll Radiol. 2018; 15: 1448-1856. 5. 3 vessel coronary artery calcifications noted. Aortic Atherosclerosis (ICD10-I70.0). Electronically Signed   By: Kerby Moors M.D.   On: June 08, 2019 20:18   Dg Chest Port 1 View  Result Date: 05/24/2019 CLINICAL DATA:  Respiratory failure EXAM: PORTABLE CHEST 1 VIEW COMPARISON:  08-Jun-2019 FINDINGS: Endotracheal tube, gastric catheter and right-sided PICC line are noted in satisfactory position. Cardiac shadow is mildly enlarged but stable. Aortic calcifications are again seen. Vascular congestion is noted patchy parenchymal opacities bilaterally likely related to edema. Left retrocardiac density remains. No bony abnormality is seen. IMPRESSION: Vascular congestion and patchy opacities likely related to edema. Left retrocardiac atelectasis. Electronically Signed   By: Inez Catalina M.D.   On: 04/28/2019 07:37   Dg Chest Portable 1 View  Result Date: 06/22/2019 CLINICAL DATA:  Intubation. Image 2 after pulled back. EXAM: PORTABLE CHEST 1 VIEW COMPARISON:  05/30/2019 FINDINGS: Endotracheal tube has been placed. On the initial image the tube is 3.6 centimeters above the carina. On the second image, tube is approximately 5.4 centimeters above the carina. Nasogastric tube is identified, tip beyond the level of the proximal stomach. Heart size is normal. There are hazy infiltrates throughout the lungs bilaterally, slightly increased since the prior study. Opacity at the LEFT lung base is more confluent. IMPRESSION: 1. Interval placement of endotracheal tube. 2. Increased bilateral infiltrates. 3. More confluent opacity at the LEFT lung base. Electronically Signed   By: Nolon Nations M.D.   On: 08-Jun-2019 18:57   Dg Chest Port 1 View  Result Date: 06/19/2019 CLINICAL DATA:  Chest pain. EXAM: PORTABLE CHEST 1 VIEW COMPARISON:  September 18, 2018. FINDINGS: The heart size and mediastinal contours are within normal limits. No  pneumothorax or pleural effusion is noted. Multiple small ill-defined interstitial densities are noted throughout both lungs which may represent focal inflammation. The visualized skeletal structures are unremarkable. IMPRESSION: Multiple small ill-defined interstitial densities are noted throughout both lungs which potentially may represent multifocal inflammation or possibly edema. Electronically Signed   By: Marijo Conception M.D.   On: June 08, 2019 16:15   [place] Ekg Site Rite  Result Date: 06/10/2019 If Site Rite image not attached, placement could not be confirmed due to current cardiac rhythm.

## 2019-05-17 NOTE — Progress Notes (Addendum)
NAME:  Raymond SkiffStephen Chavez, MRN:  [id number], DOB:  11/10/1950, LOS: 1 ADMISSION DATE:  05/06/2019, CONSULTATION DATE:  05/22/2019 REFERRING MD:  Dalene SeltzerSchlossman  CHIEF COMPLAINT:  SOB   Brief History   Raymond SkiffStephen Chavez is a 68 y.o. male who was admitted with acute hypoxic respiratory failure due to acute pulmonary edema, PNA, secondary to possible ACS / NSTEMI.  He required intubation in the ED after failing BiPAP.  History of present illness   Pt is encephelopathic; therefore, this HPI is obtained from chart review. Raymond SkiffStephen Chavez is a 68 y.o. male who has a PMH including but not limited to HTN, CAD.  [facility] with dyspnea and left sided chest pain.  He was placed on BiPAP given his increased WOB; however, he failed and required intubation.  Workup in ED included CTA chest which was negative for PE but did demonstrate bilateral basilar PNA, acute pulmonary edema, hilar adenopathy.  Labs noteable for BNP 2,725, trop 168 > 712, WBC 20.  EKG with LBBB (no baseline to compare).  He has no echo on file.  He was started on heparin gtt for empiric ACS / NSTEMI as well as vanc / cefepime for PNA.  He did receive 60mg  lasix while in ED.  Cardiology was consulted by EDP and PCCM called for admission.  Bedside POCUS by cardiology reportedly with low LV output.  Past Medical History  HTN, CAD.  Significant Hospital Events   10/22 > admit.  Consults:  Cardiology.  Procedures:  ETT 10/22 >  [place]wan pending 10/22 >   Significant Diagnostic Tests:  CTA chest 10/22 > no PE, b/l basilar consolidation, diffuse interlobular septal thickening with GGO's, b/l hilar adenopathy. Echo 10/23 > EF 15-20% with akinetic inferior wall and anteroseptal wall and apex.  The lateral wall was hypokinetic but best-preserved.  Mildly decreased RV systolic function.  Visually, probably mild aortic stenosis though no significant gradient in setting of low EF.  Micro Data:  Blood 10/22 >  Sputum 10/22 >  Urine 10/22 >   RVP 10/22 > Negative SARS CoV2 10/22 > neg.  Antimicrobials:  Vanc 10/22 >  Cefepime 10/22 >    Interim history/subjective:  Awake and alert on vent 40% Off pressors and milrinone Co-Ox 71%, CVP 18 Net negative 89 Echo with EF if 15-20%,akinetic inferior wall and anteroseptal wall and apex.  Lactic Acid 4.5>4.7>2.5   HS Trop 409>811>9147>168>712>1456> 4254  Setting up for emergent  RHC/LHC  10/23 am  to evaluate for need for mechanical support  10/23 CXR>> personally reviewed by me>> Vascular congestion / patchy opacities  Most likely 2/2  to edema.   Objective:  Blood pressure 122/88, pulse (!) 103, temperature 99.9 F (37.7 C), resp. rate (!) 25, height 5\' 7"  (1.702 m), weight 70.5 kg, SpO2 94 %. CVP:  [6 mmHg] 6 mmHg  Vent Mode: PRVC FiO2 (%):  [50 %-100 %] 60 % Set Rate:  [10 bmp-20 bmp] 20 bmp Vt Set:  [520 mL-580 mL] 520 mL PEEP:  [5 cmH20] 5 cmH20 Plateau Pressure:  [10 cmH20-20 cmH20] 13 cmH20   Intake/Output Summary (Last 24 hours) at 05/07/2019 0900 Last data filed at 05/15/2019 0732 Gross per 24 hour  Intake 840.99 ml  Output 930 ml  Net -89.01 ml   Filed Weights   05/21/2019 1600 05/10/2019 2030 04/26/2019 0545  Weight: 72.6 kg 72.6 kg 70.5 kg    Examination: General: Adult male,supine in bed awake and alert. Neuro: Awake and  alert, following commands, MAE x 4, nods appropriately HEENT: [place]/AT. Sclerae anicteric.  ETT is secure and  in place. Cardiovascular: S1, S2, RRR, no M/R/G.  Lungs: Bilateral chest excursion, Respirations even and unlabored. + crackles noted per bases, few rhonchi upper lobes Abdomen: BS x 4, soft, NT/ND. Body mass index is 24.34 kg/m. Musculoskeletal: No gross deformities, no edema.  Skin: Intact no lesions or rash, feet cool to touch  Assessment & Plan:   Acute hypoxic respiratory failure requiring intubation - multifactorial due to PNA, acute pulmonary edema (s/p 60mg  lasix in ED), ACS / NSTEMI. - Full vent support for now . - Wean as able.  - Daily SBT. - Empiric vanc / cefepime for now, follow cultures and de-escalate as able. - Trend PCT - Assess RVP, urine strep, urine legionella. - Follow CXR. - Will need to adjust  diuresis  post cath per cards - ABG on return from cath lab - BMET on return from cath lab - ABG in am and prn  ACS / NSTEMI with probable low output CHF. Echo confirms EF of 15-20% with akinetic akinetic inferior wall and anteroseptal wall and apex.  Lactate 2.5 Septic/ Cardiogenic shock Co Ox 71% HS Troponin 168>712>1456> 4254 Pressors and milrinone off 10/23 am ( pre cath) Plan - Appreciate cards assistance  - RHC/LHC 10/23 to assess for need for mechanical support - Trend troponins. - Continue heparin gtt. Ernestine Conrad per cards. - MAP goal > 65 mm Hg - trend lactate - Trend Co Ox and CVP  Hyponatremia - presumed hypervolemic in setting underlying CHF. Hyperkalemia>> 5.8  AKI. NAGMA. Plan - Strict I/O's. - Trend  BMP.>> will need to monitor closely and treat as needed - Minimize free water - Diuresis per cards    AKI in setting of cardiogenic/ septic shock Creatinine up trending 1/3>1.9>2.1  Had contrast as well a CTA 10/22  Plan Trend BMET Monitor UO Maintain renal perfusion ( MAP Goal > 65) Avoid nephrotoxic medications Consult renal   Hx HTN, CAD. - Continue preadmission ASA. - Hold preadmission amlodipine, atorvastatin, HCTZ, lisinopril.   Best Practice:  Diet: NPO. Pain/Anxiety/Delirium protocol (if indicated): Propofol gtt / Fentanyl PRN.  RASS goal 0 to -1. VAP protocol (if indicated): In place. DVT prophylaxis: SCD's / Heparin gtt. GI prophylaxis: PPI. Glucose control: SSI. Mobility: Bedrest. Code Status: Full. Family Communication: Dr. Shirlee Latch spoke with daughter Carmelia Bake listed in system) this am . Disposition: ICU.  Labs   CBC: Recent Labs  Lab 05/07/2019 1515 04/25/2019 1834 05/01/2019 1929 04/27/2019 0111 05/11/2019 0424 05/21/2019 0619  WBC 16.9* 19.6*   --   --  22.2*  --   NEUTROABS  --  17.1*  --   --   --   --   HGB 15.0 17.2* 19.7* 17.3* 16.0 17.7*  HCT 45.0 53.3* 58.0* 51.0 49.2 52.0  MCV 89.8 94.3  --   --  92.5  --   PLT 200 213  --   --  193  --    Basic Metabolic Panel: Recent Labs  Lab 05/23/2019 1515 04/27/2019 1834 05/25/2019 1929 05/15/2019 0111 05/04/2019 0424 05/23/2019 0619  NA 132* 131* 131* 134* 134* 134*  K 4.2 4.3 5.1 4.4 5.1 5.8*  CL 103 102  --   --  107  --   CO2 15* 14*  --   --  16*  --   GLUCOSE 149* 345*  --   --  144*  --   BUN  14 16  --   --  22  --   CREATININE 1.34* 1.96*  --   --  2.08*  --   CALCIUM 9.9 9.4  --   --  8.5*  --   MG  --   --   --   --  2.1  --   PHOS  --   --   --   --  4.3  --    GFR: Estimated Creatinine Clearance: 31.3 mL/min (A) (by C-G formula based on SCr of 2.08 mg/dL (H)). Recent Labs  Lab 05/19/2019 1515 04/27/2019 1834 05/23/2019 2100 05/07/2019 2325 05/10/2019 0424  WBC 16.9* 19.6*  --   --  22.2*  LATICACIDVEN  --   --  4.5* 4.7* 2.5*   Liver Function Tests: Recent Labs  Lab 05/04/2019 1834 05/13/2019 0424  AST 33 70*  ALT 23 40  ALKPHOS 108 86  BILITOT 1.4* 1.8*  PROT 7.8 6.7  ALBUMIN 3.6 3.2*   No results for input(s): LIPASE, AMYLASE in the last 168 hours. No results for input(s): AMMONIA in the last 168 hours. ABG    Component Value Date/Time   PHART 7.255 (L) 05/19/2019 0619   PCO2ART 32.8 04/29/2019 0619   PO2ART 91.0 05/19/2019 0619   HCO3 14.4 (L) 05/03/2019 0619   TCO2 15 (L) 05/14/2019 0619   ACIDBASEDEF 11.0 (H) 05/15/2019 0619   O2SAT 95.0 05/21/2019 0619    Coagulation Profile: Recent Labs  Lab 05/06/2019 0022  INR 1.7*   Cardiac Enzymes: No results for input(s): CKTOTAL, CKMB, CKMBINDEX, TROPONINI in the last 168 hours. HbA1C: Hgb A1c MFr Bld  Date/Time Value Ref Range Status  05/10/2019 11:25 PM 5.5 4.8 - 5.6 % Final    Comment:    (NOTE) Pre diabetes:          5.7%-6.4% Diabetes:              >6.4% Glycemic control for   <7.0% adults with  diabetes    CBG: Recent Labs  Lab 04/29/2019 2324 05/02/2019 0421 04/27/2019 0739  GLUCAP 226* 130* 146*    Allergies No Known Allergies   Home meds  Prior to Admission medications   Medication Sig Start Date End Date Taking? Authorizing Provider  albuterol (PROAIR HFA) 108 (90 Base) MCG/ACT inhaler Inhale 2 puffs into the lungs every 4 (four) hours as needed for wheezing or shortness of breath.    Yes [provider]  amLODipine (NORVASC) 5 MG tablet Take 5 mg by mouth daily. 12/04/17  Yes [provider]  aspirin EC 81 MG tablet Take 81 mg by mouth daily.   Yes [provider]  atorvastatin (LIPITOR) 40 MG tablet Take 40 mg by mouth at bedtime. 03/06/19  Yes [provider]  Azelastine HCl 137 MCG/SPRAY SOLN Place 1 spray into both nostrils 2 (two) times daily as needed (for allergic symptoms or rhinitis).  03/28/19  Yes [provider]  predniSONE (DELTASONE) 20 MG tablet Take 20-60 mg by mouth See admin instructions. Take 60 mg by mouth once a day for 3 days, 40 mg once a day for 3 days, then 20 mg once a day for 3 days 05/15/19  Yes [provider]  SPIRIVA RESPIMAT 1.25 MCG/ACT AERS Inhale 2 puffs into the lungs daily. 04/23/19  Yes [provider]  hydrochlorothiazide (HYDRODIURIL) 12.5 MG tablet Take 12.5 mg by mouth daily.    [provider]  lisinopril (PRINIVIL,ZESTRIL) 20 MG tablet Take 1 tablet (  20 mg total) by mouth daily. Patient not taking: Reported on 06/02/2019 11/28/13   Liam Graham, PA-C    Critical care time: 45 min.   Magdalen Spatz, MSN, AGACNP-BC [place] Pager # [phone] After 4 pm please call [phone] 05/08/2019, 9:00 AM

## 2019-05-17 NOTE — Progress Notes (Signed)
Patient ID: Raymond Chavez, male   DOB: [birth year], 68 y.o.   MRN: [id number]  SVR high, will add milrinone 0.25 (MAP 92).   Got 1 dose Veltassa with K 5.5.   Loralie Champagne 05/07/2019

## 2019-05-17 NOTE — Consult Note (Signed)
[address]       [place] [address]             [phone]        Wilford Ruscitti Esto Medical Record #[id number] Date of Birth: 23-Jan-1951  Referring: No ref. provider found Primary Care: Alroy Dust, L.Marlou Sa, MD Primary Cardiologist:No primary care provider on file.  Chief Complaint:    Chief Complaint  Patient presents with  . Chest Pain  . Shortness of Breath    History of Present Illness:     68 yo man from [place] is seen at the bedside where his daughter is also visiting and serves as primary informant. Mr. Ziomek has h/o MI in 1998 but has been doing well, remaining active until 2 days ago when he experienced CP prompting decision to report to [facility] ER. There, he experienced increased SOB and respiratory distress.Ruled in for NSTEMI, BNP 2725. Echo showed severe LV dysfunction. 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 Impella CP placed and taken to [facility] ICU. Consult received for consideration of CABG vs LVAD. Currently hemodynamically stable on the Impella but has had signs of hemolysis this pm. Medical comorbidities notable for renal insufficiency and pulmonary HTN. He is divorced and lives alone, although there is family in the area.   Current Activity/ Functional Status: Patient was independent with mobility/ambulation, transfers, ADL's, IADL's.   Zubrod Score: At the time of surgery this patient's most appropriate activity status/level should be described as: _0     0    Normal activity, no symptoms _1     1    Restricted in physical strenuous activity but ambulatory, able to do out light work _2     2    Ambulatory and capable of self care, unable to do work activities, up and about                 more than 50%  Of the time                            _3     3    Only limited self care, in bed greater than 50% of waking hours _4     4    Completely disabled, no self care, confined to bed or  chair _5     5    Moribund  Past Medical History:  Diagnosis Date  . Acute respiratory failure with hypoxemia ([place]) 04/2019  . Carotid artery occlusion   . Hypertension     Past Surgical History:  Procedure Laterality Date  . RIGHT/LEFT HEART CATH AND CORONARY ANGIOGRAPHY N/A 05/10/2019   Procedure: RIGHT/LEFT HEART CATH AND CORONARY ANGIOGRAPHY;  Surgeon: Larey Dresser, MD;  Location: [facility];  Service: Cardiovascular;  Laterality: N/A;  . VENTRICULAR ASSIST DEVICE INSERTION N/A 05/09/2019   Procedure: VENTRICULAR ASSIST DEVICE INSERTION;  Surgeon: Larey Dresser, MD;  Location: [facility];  Service: Cardiovascular;  Laterality: N/A;    Social History   Tobacco Use  Smoking Status Light Tobacco Smoker  . Packs/day: 0.00  . Years: 40.00  . Pack years: 0.00  . Types: Cigarettes  . Last attempt to quit: 10/23/2017  . Years since quitting: 1.5  Smokeless Tobacco Never Used    Social History   Substance and Sexual Activity  Alcohol Use Yes  . Alcohol/week: 6.0 - 12.0 standard drinks  .  Types: 6 - 12 Cans of beer per week     No Known Allergies  Current Facility-Administered Medications  Medication Dose Route Frequency Provider Last Rate Last Dose  . 0.9 %  sodium chloride infusion   Intravenous Continuous Gareth Morgan, MD 10 mL/hr at 05/07/2019 1600    . 0.9 %  sodium chloride infusion   Intravenous PRN Magdalen Spatz, NP 10 mL/hr at 05/02/2019 1600    . 0.9 %  sodium chloride infusion   Intra-arterial PRN Magdalen Spatz, NP      . aspirin chewable tablet 81 mg  81 mg Per Tube Daily Desai, Rahul P, PA-C   81 mg at 05/06/2019 0903  . atorvastatin (LIPITOR) tablet 80 mg  80 mg Oral q1800 Larey Dresser, MD   80 mg at 05/12/2019 1800  . calcium gluconate inj 10% (1 g) URGENT USE ONLY!  1 g Intravenous Once Wonda Olds, MD      . Derrill Memo ON 05/04/2019] ceFEPIme (MAXIPIME) 2 g in sodium chloride 0.9 % 100 mL IVPB  2 g Intravenous Q24H Einar Grad, RPH      . chlorhexidine gluconate (MEDLINE KIT) (PERIDEX) 0.12 % solution 15 mL  15 mL Mouth Rinse BID Scatliffe, Kristen D, MD   15 mL at 05/06/2019 0800  . Chlorhexidine Gluconate Cloth 2 % PADS 6 each  6 each Topical Daily Scatliffe, Rise Paganini, MD   6 each at 05/11/2019 [id number]  . docusate (COLACE) 50 MG/5ML liquid 100 mg  100 mg Per Tube BID PRN Scatliffe, Kristen D, MD      . feeding supplement (VITAL AF 1.2 CAL) liquid 1,000 mL  1,000 mL Per Tube Continuous Magdalen Spatz, NP 60 mL/hr at 05/13/2019 1600 1,000 mL at 05/08/2019 1600  . fentaNYL (SUBLIMAZE) bolus via infusion 25 mcg  25 mcg Intravenous Q15 min PRN Scatliffe, Cyril Mourning D, MD      . fentaNYL (SUBLIMAZE) injection 25 mcg  25 mcg Intravenous Q15 min PRN Desai, Rahul P, PA-C      . fentaNYL (SUBLIMAZE) injection 25-100 mcg  25-100 mcg Intravenous Q30 min PRN Desai, Rahul P, PA-C      . fentaNYL 2544mg in NS 2519m(1034mml) infusion-PREMIX  25-200 mcg/hr Intravenous Continuous Scatliffe, Kristen D, MD 20 mL/hr at 05/07/2019 1600 200 mcg/hr at 04/26/2019 1600  . furosemide (LASIX) 160 mg in dextrose 5 % 50 mL IVPB  160 mg Intravenous Once RamBrand MalesD 66 mL/hr at 04/29/2019 1813 160 mg at 05/22/2019 1813  . gabapentin (NEURONTIN) 250 MG/5ML solution 100 mg  100 mg Per Tube Q8H Scatliffe, KriRise PaganiniD   Stopped at 05/12/2019 0601  . heparin 50,000 Units in dextrose 5 % 1,000 mL (50 Units/mL)  50,000 Units Intracatheter Continuous BitEinar GradPH   50,000 Units at 05/21/2019 1300  . heparin ADULT infusion 100 units/mL (25000 units/250m58mdium chloride 0.45%)  200-700 Units/hr Intravenous Continuous BitoEinar GradH 3 mL/hr at 05/09/2019 1600 300 Units/hr at 05/07/2019 1600  . insulin aspart (novoLOG) injection 1-3 Units  1-3 Units Subcutaneous Q4H Scatliffe, KrisRise Paganini   1 Units at 05/02/2019 0513  . magnesium sulfate IVPB 2 g 50 mL  2 g Intravenous Once Shaniya Tashiro, BroaGlenice Bow      . MEDLINE mouth rinse  15 mL Mouth Rinse 10 times per  day Scatliffe, KrisRise Paganini   15 mL at 05/17/2019 1734  . midazolam (VERSED) 50 mg/50 mL (1 mg/mL)  premix infusion  1 mg/hr Intravenous Continuous Larey Dresser, MD 1 mL/hr at 04/25/2019 1600 1 mg/hr at 05/05/2019 1600  . midazolam (VERSED) injection 1 mg  1 mg Intravenous Q15 min PRN Scatliffe, Kristen D, MD      . midazolam (VERSED) injection 1 mg  1 mg Intravenous Q2H PRN Scatliffe, Rise Paganini, MD   1 mg at 05/11/2019 0140  . milrinone (PRIMACOR) 20 MG/100 ML (0.2 mg/mL) infusion  0.25 mcg/kg/min Intravenous Continuous Larey Dresser, MD 5.29 mL/hr at 04/27/2019 1600 0.25 mcg/kg/min at 05/24/2019 1600  . norepinephrine (LEVOPHED) 23m in 2517mpremix infusion  0-40 mcg/min Intravenous Titrated TrMilus BanisterMD 18.75 mL/hr at 05/08/2019 1851 5 mcg/min at 05/13/2019 1851  . pantoprazole (PROTONIX) injection 40 mg  40 mg Intravenous QHS Desai, Rahul P, PA-C   40 mg at 05/04/2019 2337  . sodium bicarbonate 100 mEq in dextrose 5 % 1,000 mL infusion   Intravenous Continuous RaBrand MalesMD 50 mL/hr at 05/14/2019 1816    . sodium bicarbonate injection 100 mEq  100 mEq Intravenous Once Desai, Rahul P, PA-C      . sodium chloride flush (NS) 0.9 % injection 10-40 mL  10-40 mL Intracatheter Q12H Scatliffe, KrCyril Mourning, MD   10 mL at 05/11/2019 2345  . sodium chloride flush (NS) 0.9 % injection 10-40 mL  10-40 mL Intracatheter PRN Scatliffe, Kristen D, MD      . sodium chloride flush (NS) 0.9 % injection 3 mL  3 mL Intravenous Q12H Clegg, Amy D, NP      . sodium zirconium cyclosilicate (LOKELMA) packet 10 g  10 g Oral TID RaBrand MalesMD      . [SDerrill MemoN 05/19/2019] vancomycin (VANCOCIN) IVPB 750 mg/150 ml premix  750 mg Intravenous Q24H BiEinar GradRPMemorial Health Univ [facility], Inc      Medications Prior to Admission  Medication Sig Dispense Refill Last Dose  . albuterol (PROAIR HFA) 108 (90 Base) MCG/ACT inhaler Inhale 2 puffs into the lungs every 4 (four) hours as needed for wheezing or shortness of breath.    unk at [facility].  amLODipine (NORVASC) 5 MG tablet Take 5 mg by mouth daily.  3 unk at unk  . aspirin EC 81 MG tablet Take 81 mg by mouth daily.   unk at [facility]. atorvastatin (LIPITOR) 40 MG tablet Take 40 mg by mouth at bedtime.   unk at [facility]. Azelastine HCl 137 MCG/SPRAY SOLN Place 1 spray into both nostrils 2 (two) times daily as needed (for allergic symptoms or rhinitis).    unk at [facility]. predniSONE (DELTASONE) 20 MG tablet Take 20-60 mg by mouth See admin instructions. Take 60 mg by mouth once a day for 3 days, 40 mg once a day for 3 days, then 20 mg once a day for 3 days   unk at unk  . SPIRIVA RESPIMAT 1.25 MCG/ACT AERS Inhale 2 puffs into the lungs daily.   unk at [facility]. hydrochlorothiazide (HYDRODIURIL) 12.5 MG tablet Take 12.5 mg by mouth daily.   Not Taking at Unknown time  . lisinopril (PRINIVIL,ZESTRIL) 20 MG tablet Take 1 tablet (20 mg total) by mouth daily. (Patient not taking: Reported on 05/23/2019) 30 tablet 0 Not Taking at Unknown time    History reviewed. No pertinent family history.   Review of Systems:   ROS Review of systems not obtained due to patient factors.     Cardiac Review of Systems:  Y or  [    ]= no  Chest Pain [    ]  Resting SOB [   ] Exertional SOB  [  ]  Orthopnea [  ]   Pedal Edema [   ]    Palpitations [  ] Syncope  [  ]   Presyncope [   ]  General Review of Systems: [Y] = yes [  ]=no Constitional: recent weight change [  ]; anorexia [  ]; fatigue [  ]; nausea [  ]; night sweats [  ]; fever [  ]; or chills [  ]                                                               Dental: Last Dentist visit:   Eye : blurred vision [  ]; diplopia [   ]; vision changes [  ];  Amaurosis fugax[  ]; Resp: cough [  ];  wheezing[  ];  hemoptysis[  ]; shortness of breath[  ]; paroxysmal nocturnal dyspnea[  ]; dyspnea on exertion[  ]; or orthopnea[  ];  GI:  gallstones[  ], vomiting[  ];  dysphagia[  ]; melena[  ];  hematochezia [  ]; heartburn[  ];   Hx of  Colonoscopy[  ]; GU: kidney  stones [  ]; hematuria[  ];   dysuria [  ];  nocturia[  ];  history of     obstruction [  ]; urinary frequency [  ]             Skin: rash, swelling[  ];, hair loss[  ];  peripheral edema[  ];  or itching[  ]; Musculosketetal: myalgias[  ];  joint swelling[  ];  joint erythema[  ];  joint pain[  ];  back pain[  ];  Heme/Lymph: bruising[  ];  bleeding[  ];  anemia[  ];  Neuro: TIA[  ];  headaches[  ];  stroke[  ];  vertigo[  ];  seizures[  ];   paresthesias[  ];  difficulty walking[  ];  Psych:depression[  ]; anxiety[  ];  Endocrine: diabetes[  ];  thyroid dysfunction[  ];              Physical Exam: BP (!) 89/70   Pulse 92   Temp 99 F (37.2 C) (Bladder)   Resp (!) 0   Ht _0  (1.702 m) Comment: measured x3  Wt 70.5 kg   SpO2 97%   BMI 24.34 kg/m    General appearance: sedated, moves extremities spontaneously Head: Normocephalic, without obvious abnormality, atraumatic Resp: rales bilaterally Cardio: regular rate and rhythm, S1, S2 normal, no murmur, click, rub or gallop GI: soft, non-tender; bowel sounds normal; no masses,  no organomegaly Extremities: cool peripherally Neurologic: Grossly normal  Diagnostic Studies & Laboratory data:     Recent Radiology Findings:   Ct Angio Chest Pe W And/or Wo Contrast  Result Date: 04/28/2019 CLINICAL DATA:  Shortness of breath. EXAM: CT ANGIOGRAPHY CHEST WITH CONTRAST TECHNIQUE: Multidetector CT imaging of the chest was performed using the standard protocol during bolus administration of intravenous contrast. Multiplanar CT image reconstructions and MIPs were obtained to evaluate the vascular anatomy. CONTRAST:  68m OMNIPAQUE IOHEXOL 350 MG/ML SOLN COMPARISON:  12/05/2017 FINDINGS: Cardiovascular: The main pulmonary artery is patent. There is no central obstructing or saddle embolus identified. No lobar pulmonary artery filling defects for segmental pulmonary artery filling defects identified. Normal heart size. No pericardial effusion.  Aortic atherosclerosis. 3 vessel coronary artery calcifications. Mediastinum/Nodes: The endotracheal tube tip is above the carina. There is a nasogastric tube with tip in the stomach. No supraclavicular, axillary or mediastinal adenopathy. Diffuse increased soft tissue within bilateral hilar regions identified, new from previous exam. For example, within the right hilar region there is increased soft tissue measuring 3.7 x 2.9 cm. Within the left suprahilar region there is increased soft tissue measuring 3.1 x 2.0 cm. Lungs/Pleura: There is dense bilateral lower lobe posterior airspace consolidation. There is also bilateral upper lobe posterior subpleural consolidation. Diffuse interlobular septal thickening with areas of ground-glass attenuation identified compatible with pulmonary edema. Upper Abdomen: No acute abnormality. Musculoskeletal: No chest wall abnormality. No acute or significant osseous findings. Review of the MIP images confirms the above findings. IMPRESSION: 1. No evidence for acute pulmonary embolus. 2. Bilateral lower lobe posterior subpleural consolidation is identified compatible with multifocal pneumonia. 3. Diffuse interlobular septal thickening, ground-glass attenuation and areas of ground-glass attenuation compatible with pulmonary edema which may be secondary to heart failure 4. Interval development of bilateral hilar adenopathy which may be reactive in etiology. Differential considerations include granulomatous inflammation/infection, lymphoproliferative disorder or metastatic adenopathy. This is may also be nonspecific in the setting of CHF. Advise follow-up imaging in 3 months with repeat CT of the chest with contrast. This recommendation follows ACR consensus guidelines: Managing Incidental Findings on Thoracic CT: Mediastinal and Cardiovascular Findings. A White Paper of the ACR Incidental Findings Committee. J Am Coll Radiol. 2018; 15: 8264-1583. 5. 3 vessel coronary artery  calcifications noted. Aortic Atherosclerosis (ICD10-I70.0). Electronically Signed   By: Kerby Moors M.D.   On: 05/24/2019 20:18   Dg Chest Port 1 View  Result Date: 04/28/2019 CLINICAL DATA:  Respiratory failure EXAM: PORTABLE CHEST 1 VIEW COMPARISON:  05/07/2019 FINDINGS: Endotracheal tube, gastric catheter and right-sided PICC line are noted in satisfactory position. Cardiac shadow is mildly enlarged but stable. Aortic calcifications are again seen. Vascular congestion is noted patchy parenchymal opacities bilaterally likely related to edema. Left retrocardiac density remains. No bony abnormality is seen. IMPRESSION: Vascular congestion and patchy opacities likely related to edema. Left retrocardiac atelectasis. Electronically Signed   By: Inez Catalina M.D.   On: 04/29/2019 07:37   Dg Chest Portable 1 View  Result Date: 05/12/2019 CLINICAL DATA:  Intubation. Image 2 after pulled back. EXAM: PORTABLE CHEST 1 VIEW COMPARISON:  05/09/2019 FINDINGS: Endotracheal tube has been placed. On the initial image the tube is 3.6 centimeters above the carina. On the second image, tube is approximately 5.4 centimeters above the carina. Nasogastric tube is identified, tip beyond the level of the proximal stomach. Heart size is normal. There are hazy infiltrates throughout the lungs bilaterally, slightly increased since the prior study. Opacity at the LEFT lung base is more confluent. IMPRESSION: 1. Interval placement of endotracheal tube. 2. Increased bilateral infiltrates. 3. More confluent opacity at the LEFT lung base. Electronically Signed   By: Nolon Nations M.D.   On: 05/15/2019 18:57   Dg Chest Port 1 View  Result Date: 05/20/2019 CLINICAL DATA:  Chest pain. EXAM: PORTABLE CHEST 1 VIEW COMPARISON:  September 18, 2018. FINDINGS: The heart size and mediastinal contours are within normal limits. No pneumothorax or pleural effusion is noted. Multiple small ill-defined interstitial densities  are noted  throughout both lungs which may represent focal inflammation. The visualized skeletal structures are unremarkable. IMPRESSION: Multiple small ill-defined interstitial densities are noted throughout both lungs which potentially may represent multifocal inflammation or possibly edema. Electronically Signed   By: Marijo Conception M.D.   On: 05/15/2019 16:15   [place] Ekg Site Rite  Result Date: 04/26/2019 If Site Rite image not attached, placement could not be confirmed due to current cardiac rhythm.    I have independently reviewed the above radiologic studies and discussed with the patient   Recent Lab Findings: Lab Results  Component Value Date   WBC 15.9 (H) 05/04/2019   HGB 14.6 05/03/2019   HCT 43.0 05/10/2019   PLT 149 (L) 05/07/2019   GLUCOSE 162 (H) 05/10/2019   CHOL 115 04/26/2019   TRIG 68 05/13/2019   HDL 30 (L) 05/07/2019   LDLCALC 66 05/15/2019   ALT 40 05/19/2019   AST 70 (H) 05/14/2019   NA 136 05/02/2019   K 5.3 (H) 05/11/2019   CL 105 04/29/2019   CREATININE 2.48 (H) 05/04/2019   BUN 34 (H) 05/05/2019   CO2 17 (L) 05/25/2019   TSH 7.401 (H) 05/15/2019   INR 1.7 (H) 04/26/2019   HGBA1C 5.5 05/01/2019      Assessment / Plan:     68 yo man previously independent but now with cardiogenic shock. His trajectory is not clear presently. Ideally, we could extubate him once medically stabilized and discuss best treatment options. Given his current medical condition, he would be near prohibitive risk for CABG alone, but this may improve depending upon his renal and pulmonary function. I would consider upgrading to Impella 5.0 via left axillary artery if he has any further hemolysis episodes.  I have discussed his current condition to the best of my understanding with his daughter.      I  spent 30 minutes counseling the patient face to face.   Lileigh Fahringer Z. [name], MD TCTS 04/28/2019 7:01 PM

## 2019-05-17 NOTE — Consult Note (Signed)
[facility]    NEPHROLOGY CONSULTATION NOTE  PATIENT ID:  Raymond Chavez, DOB:  10/25/1950  HPI: The patient is a 68 y.o. year old male patient with a past medical history significant for hypertension and coronary artery disease who presented with acute hypoxemic respiratory failure due to pulmonary edema, pneumonia, possible ACS/NSTEMI.  He was intubated in the emergency department after failing BiPAP.  He underwent a CT angiogram of the chest in the ER that was negative for PE.  He ultimately went to the Cath Lab for further investigation as bedside POC [place] by cardiology revealed low cardiac output. He was found to have triple-vessel disease.  Serum creatinine was 2.31 on admission, prompting renal consultation.  The patient is intubated and sedated and unable to provide any other associated history.   Past Medical History:  Diagnosis Date  . Acute respiratory failure with hypoxemia ([place]) 04/2019  . Carotid artery occlusion   . Hypertension     Past Surgical History:  Procedure Laterality Date  . RIGHT/LEFT HEART CATH AND CORONARY ANGIOGRAPHY N/A 05/13/2019   Procedure: RIGHT/LEFT HEART CATH AND CORONARY ANGIOGRAPHY;  Surgeon: Larey Dresser, MD;  Location: [facility];  Service: Cardiovascular;  Laterality: N/A;  . VENTRICULAR ASSIST DEVICE INSERTION N/A 05/21/2019   Procedure: VENTRICULAR ASSIST DEVICE INSERTION;  Surgeon: Larey Dresser, MD;  Location: [facility];  Service: Cardiovascular;  Laterality: N/A;    History reviewed. No pertinent family history.  Social History   Tobacco Use  . Smoking status: Light Tobacco Smoker    Packs/day: 0.00    Years: 40.00    Pack years: 0.00    Types: Cigarettes    Last attempt to quit: 10/23/2017    Years since quitting: 1.5  . Smokeless tobacco: Never Used  Substance Use Topics  . Alcohol use: Yes    Alcohol/week: 6.0 - 12.0 standard drinks    Types: 6 - 12 Cans of beer per week  . Drug use: No     REVIEW OF SYSTEMS: Unable to obtain as the patient is intubated and sedated.    PHYSICAL EXAM:  Vitals:   05/06/2019 1300 05/15/2019 1400  BP: (!) 103/91 92/80  Pulse:    Resp: (!) 4 (!) 0  Temp: 98.8 F (37.1 C) 98.4 F (36.9 C)  SpO2: 95% 94%   I/O last 3 completed shifts: In: 824.6 [I.V.:224.5; IV Piggyback:600.1] Out: 930 [Urine:530; Emesis/NG output:400]   General: Sedated NAD HEENT: MMM [place] AT anicteric sclera Neck:  No JVD, no adenopathy CV:  Heart RRR  Lungs:  L/S with bibasilar Rales Abd:  abd SNT/ND with normal BS GU:  Bladder non-palpable Extremities: +1 bilateral lower extremity edema Skin:  No skin rash Psych: Sedated Neuro:  no focal deficits   CURRENT MEDICATIONS:  . aspirin  81 mg Per Tube Daily  . atorvastatin  80 mg Oral q1800  . chlorhexidine gluconate (MEDLINE KIT)  15 mL Mouth Rinse BID  . Chlorhexidine Gluconate Cloth  6 each Topical Daily  . gabapentin  100 mg Per Tube Q8H  . insulin aspart  1-3 Units Subcutaneous Q4H  . mouth rinse  15 mL Mouth Rinse 10 times per day  . pantoprazole (PROTONIX) IV  40 mg Intravenous QHS  . patiromer  8.4 g Oral Daily  . sodium bicarbonate  100 mEq Intravenous Once  . sodium chloride flush  10-40 mL Intracatheter Q12H  . sodium chloride flush  3 mL Intravenous Q12H  HOME MEDICATIONS:  Prior to Admission medications   Medication Sig Start Date End Date Taking? Authorizing Provider  albuterol (PROAIR HFA) 108 (90 Base) MCG/ACT inhaler Inhale 2 puffs into the lungs every 4 (four) hours as needed for wheezing or shortness of breath.    Yes [provider]  amLODipine (NORVASC) 5 MG tablet Take 5 mg by mouth daily. 12/04/17  Yes [provider]  aspirin EC 81 MG tablet Take 81 mg by mouth daily.   Yes [provider]  atorvastatin (LIPITOR) 40 MG tablet Take 40 mg by mouth at bedtime. 03/06/19  Yes [provider]  Azelastine HCl 137 MCG/SPRAY SOLN Place 1 spray into both  nostrils 2 (two) times daily as needed (for allergic symptoms or rhinitis).  03/28/19  Yes [provider]  predniSONE (DELTASONE) 20 MG tablet Take 20-60 mg by mouth See admin instructions. Take 60 mg by mouth once a day for 3 days, 40 mg once a day for 3 days, then 20 mg once a day for 3 days 05/15/19  Yes [provider]  SPIRIVA RESPIMAT 1.25 MCG/ACT AERS Inhale 2 puffs into the lungs daily. 04/23/19  Yes [provider]  hydrochlorothiazide (HYDRODIURIL) 12.5 MG tablet Take 12.5 mg by mouth daily.    [provider]  lisinopril (PRINIVIL,ZESTRIL) 20 MG tablet Take 1 tablet (20 mg total) by mouth daily. Patient not taking: Reported on 04/28/2019 11/28/13   Liam Graham, PA-C       LABS:  CBC Latest Ref Rng & Units 05/23/2019 05/22/2019 04/30/2019  WBC 4.0 - 10.5 K/uL 20.8(H) - 22.2(H)  Hemoglobin 13.0 - 17.0 g/dL 16.3 17.7(H) 16.0  Hematocrit 39.0 - 52.0 % 51.1 52.0 49.2  Platelets 150 - 400 K/uL 204 - 193    CMP Latest Ref Rng & Units 05/19/2019 04/28/2019 05/21/2019  Glucose 70 - 99 mg/dL 128(H) 143(H) -  BUN 8 - 23 mg/dL 30(H) 26(H) -  Creatinine 0.61 - 1.24 mg/dL 2.23(H) 2.31(H) -  Sodium 135 - 145 mmol/L 133(L) 138 134(L)  Potassium 3.5 - 5.1 mmol/L 5.5(H) 5.7(H) 5.8(H)  Chloride 98 - 111 mmol/L 105 109 -  CO2 22 - 32 mmol/L 17(L) 16(L) -  Calcium 8.9 - 10.3 mg/dL 7.8(L) 8.2(L) -  Total Protein 6.5 - 8.1 g/dL - - -  Total Bilirubin 0.3 - 1.2 mg/dL - - -  Alkaline Phos 38 - 126 U/L - - -  AST 15 - 41 U/L - - -  ALT 0 - 44 U/L - - -    Lab Results  Component Value Date   CALCIUM 7.8 (L) 05/15/2019   CAION 1.21 05/07/2019   PHOS 4.3 04/26/2019    No results found for: COLORURINE, APPEARANCEUR, LABSPEC, PHURINE, GLUCOSEU, HGBUR, BILIRUBINUR, KETONESUR, PROTEINUR, UROBILINOGEN, NITRITE, LEUKOCYTESUR    Component Value Date/Time   PHART 7.255 (L) 05/24/2019 0619   PCO2ART 32.8 05/09/2019 0619   PO2ART 91.0 05/08/2019 0619   HCO3 14.4  (L) 05/16/2019 0619   TCO2 15 (L) 05/25/2019 0619   ACIDBASEDEF 11.0 (H) 04/25/2019 0619   O2SAT 72.1 05/25/2019 1245    No results found for: IRON, TIBC, FERRITIN, IRONPCTSAT     ASSESSMENT/PLAN:     1.  Baseline serum creatinine 1.34 on admission.  2.  Acute kidney injury.  Likely secondary to decreased cardiac output in the setting of congestive heart failure.  He may have had some component of contrast-induced nephropathy.  Serum creatinine is actually improving this afternoon after  balloon pump and placement, and urine output is excellent.  Continue current management with diuretics.  3.  Coronary artery disease.  Triple-vessel disease noted on cardiac catheterization.  For possible CABG.  4.  Acute hypoxemic respiratory failure.  Secondary to congestive heart failure.  Continue diuresis.  Being covered for possible pneumonia as well.  5.  Acute systolic congestive heart failure.  Ejection fraction less than 20%.  Continue balloon pump support and diuresis.  ACE inhibitor on hold in the setting of acute kidney injury.     [place], DO, [place]

## 2019-05-17 NOTE — Progress Notes (Signed)
ANTICOAGULATION CONSULT NOTE - Follow Up Consult  Pharmacy Consult for Heparin  Indication: chest pain/ACS  No Known Allergies  Patient Measurements: Height: 5\' 7"  (170.2 cm)(measured x3) Weight: 155 lb 6.8 oz (70.5 kg) IBW/kg (Calculated) : 66.1  Vital Signs: Temp: 99.9 F (37.7 C) (10/23 0645) BP: 114/86 (10/23 0645) Pulse Rate: 89 (10/23 0421)  Labs: Recent Labs    05/24/2019 0022  04/25/2019 1515 05/19/2019 1834  05/14/2019 2055 04/25/2019 2325 05/19/2019 0111 05/19/2019 0424 05/06/2019 0504 05/05/2019 0619  HGB  --    < > 15.0 17.2*   < >  --   --  17.3* 16.0  --  17.7*  HCT  --    < > 45.0 53.3*   < >  --   --  51.0 49.2  --  52.0  PLT  --   --  200 213  --   --   --   --  193  --   --   APTT 52*  --   --   --   --   --   --   --   --   --   --   LABPROT 20.0*  --   --   --   --   --   --   --   --   --   --   INR 1.7*  --   --   --   --   --   --   --   --   --   --   HEPARINUNFRC  --   --   --   --   --   --   --   --   --  0.17*  --   CREATININE  --   --  1.34* 1.96*  --   --   --   --  2.08*  --   --   TROPONINIHS  --    < > 168* 712*  --  1,456* 4,254*  --   --   --   --    < > = values in this interval not displayed.    Estimated Creatinine Clearance: 31.3 mL/min (A) (by C-G formula based on SCr of 2.08 mg/dL (H)).   Assessment: 68 y/o M on heparin for elevated troponin, initial heparin level sub-therapeutic, CBC good, no issues per RN.   Goal of Therapy:  Heparin level 0.3-0.7 units/ml Monitor platelets by anticoagulation protocol: Yes   Plan:  -Inc heparin to 1100 units/hr -Re-check heparin level in 8 hours  Narda Bonds, PharmD, [place] Pharmacist Phone: [phone]

## 2019-05-17 NOTE — Progress Notes (Addendum)
eLink Physician-Brief Progress Note Patient Name: [name] DOB: 1951/07/06 MRN: [id number]   Date of Service  05/08/2019  HPI/Events of Note  Acute respiratory failure on the ventilator  eICU Interventions  ABG at 5 a.m. to guide weaning of FiO2.        Kerry Kass Loc Feinstein 05/24/2019, 2:23 AM

## 2019-05-17 NOTE — Consult Note (Addendum)
Advanced Heart Failure Team Consult Note   Primary Physician: Alroy Dust, L.Marlou Sa, MD PCP-Cardiologist:  No primary care provider on file.  Reason for Consultation: Cardiogenic Shock   HPI:    Raymond Chavez is seen today for evaluation of cardiogenic at the request of Dr Marletta Lor   Mr Marletta Lor is a 68 year old with history of CAD S/P MI 1998, HTN, carotid disease.   Presented to [facility] with worsening shortness of breath over the last 2-3 weeks. Initially on Bipap but continued to decline requiring intubation. CTA with no PE, multifocal pneumonia, 3 vessel coronary artery calcifications, and ground glass attenuation.  Placed on NTG drip and given IV lasix. NTG stopped. Started on norepi but weaned off. Pertinent admission labs included: HS Trop 168>712>1456> 4254, BNP 2725, WBC 16.9, Hgb 15, and lacitc acid 4.5. Covid negative.   Echo today per Dr Aundra Dubin - EF ~15-20% with RV dysfunction   Review of Systems: [y] = yes, _0  = no Patient is encephalopathic and or intubated. Therefore history has been obtained from chart review.   . General: Weight gain _1 ; Weight loss _2 ; Anorexia _3 ; Fatigue _4 ; Fever _5 ; Chills _6 ; Weakness _7   . Cardiac: Chest pain/pressure _8 ; Resting SOB _9 ; Exertional SOB _10 ; Orthopnea _11 ; Pedal Edema _12 ; Palpitations _13 ; Syncope _14 ; Presyncope _15 ; Paroxysmal nocturnal dyspnea_16   . Pulmonary: Cough _17 ; Wheezing_18 ; Hemoptysis_19 ; Sputum _20 ; Snoring _21   . GI: Vomiting_22 ; Dysphagia_23 ; Melena_24 ; Hematochezia _25 ; Heartburn_26 ; Abdominal pain _27 ; Constipation _28 ; Diarrhea _29 ; BRBPR _30   . GU: Hematuria_31 ; Dysuria _32 ; Nocturia_33   . Vascular: Pain in legs with walking _34 ; Pain in feet with lying flat _35 ; Non-healing sores _36 ; Stroke _37 ; TIA _38 ; Slurred speech _39 ;  . Neuro: Headaches_40 ; Vertigo_41 ; Seizures_42 ; Paresthesias_43 ;Blurred vision _44 ; Diplopia _45 ; Vision changes _46   . Ortho/Skin: Arthritis _47 ; Joint pain _48 ; Muscle pain _49 ; Joint  swelling _50 ; Back Pain _51 ; Rash _52   . Psych: Depression_53 ; Anxiety_54   . Heme: Bleeding problems _55 ; Clotting disorders _56 ; Anemia _57   . Endocrine: Diabetes _58 ; Thyroid dysfunction_59   Home Medications Prior to Admission medications   Medication Sig Start Date End Date Taking? Authorizing Provider  albuterol (PROAIR HFA) 108 (90 Base) MCG/ACT inhaler Inhale 2 puffs into the lungs every 4 (four) hours as needed for wheezing or shortness of breath.    Yes [provider]  amLODipine (NORVASC) 5 MG tablet Take 5 mg by mouth daily. 12/04/17  Yes [provider]  aspirin EC 81 MG tablet Take 81 mg by mouth daily.   Yes [provider]  atorvastatin (LIPITOR) 40 MG tablet Take 40 mg by mouth at bedtime. 03/06/19  Yes [provider]  Azelastine HCl 137 MCG/SPRAY SOLN Place 1 spray into both nostrils 2 (two) times daily as needed (for allergic symptoms or rhinitis).  03/28/19  Yes [provider]  predniSONE (DELTASONE) 20 MG tablet Take 20-60 mg by mouth See admin instructions. Take 60 mg by mouth once a day for 3 days, 40 mg once a day for 3 days, then 20 mg once a day for 3 days 05/15/19  Yes [provider]  SPIRIVA RESPIMAT 1.25 MCG/ACT AERS Inhale  2 puffs into the lungs daily. 04/23/19  Yes [provider]  °hydrochlorothiazide (HYDRODIURIL) 12.5 MG tablet Take 12.5 mg by mouth daily.    [provider]  °lisinopril (PRINIVIL,ZESTRIL) 20 MG tablet Take 1 tablet (20 mg total) by mouth daily. °Patient not taking: Reported on 05/15/2019 11/28/13   Baker, Zachary H, PA-C  ° ° °Past Medical History: °Past Medical History:  °Diagnosis Date  °• Carotid artery occlusion   °• Hypertension   ° ° °Past Surgical History: °No past surgical history on file. ° °Family History: °No family history on file. ° °Social History: °Social History  ° °Socioeconomic History  °• Marital status: Divorced  °  Spouse name: Not on file  °• Number of children: 2   °• Years of education: Not on file  °• Highest education level: Not on file  °Occupational History  °• Not on file  °Social Needs  °• Financial resource strain: Not on file  °• Food insecurity  °  Worry: Not on file  °  Inability: Not on file  °• Transportation needs  °  Medical: Not on file  °  Non-medical: Not on file  °Tobacco Use  °• Smoking status: Light Tobacco Smoker  °  Packs/day: 0.00  °  Years: 40.00  °  Pack years: 0.00  °  Types: Cigarettes  °  Last attempt to quit: 10/23/2017  °  Years since quitting: 1.5  °• Smokeless tobacco: Never Used  °Substance and Sexual Activity  °• Alcohol use: Yes  °  Alcohol/week: 6.0 - 12.0 standard drinks  °  Types: 6 - 12 Cans of beer per week  °• Drug use: No  °• Sexual activity: Yes  °Lifestyle  °• Physical activity  °  Days per week: Not on file  °  Minutes per session: Not on file  °• Stress: Not on file  °Relationships  °• Social connections  °  Talks on phone: Not on file  °  Gets together: Not on file  °  Attends religious service: Not on file  °  Active member of club or organization: Not on file  °  Attends meetings of clubs or organizations: Not on file  °  Relationship status: Not on file  °Other Topics Concern  °• Not on file  °Social History Narrative  °• Not on file  ° ° °Allergies:  °No Known Allergies ° °Objective:   ° °Vital Signs:   °Temp:  [95.2 °F (35.1 °C)-99.9 °F (37.7 °C)] 99.9 °F (37.7 °C) (10/23 0700) °Pulse Rate:  [85-137] 89 (10/23 0421) °Resp:  [16-43] 17 (10/23 0700) °BP: (86-134)/(68-99) 115/89 (10/23 0700) °SpO2:  [87 %-100 %] 96 % (10/23 0700) °FiO2 (%):  [50 %-100 %] 60 % (10/23 0421) °Weight:  [70.5 kg-72.6 kg] 70.5 kg (10/23 0545) °Last BM Date: (PTA) ° °Weight change: °Filed Weights  ° 05/24/2019 1600 05/22/2019 2030 05/08/2019 0545  °Weight: 72.6 kg 72.6 kg 70.5 kg  ° ° °Intake/Output:  ° °Intake/Output Summary (Last 24 hours) at 05/25/2019 0719 °Last data filed at 05/07/2019 0600 °Gross per 24 hour  °Intake 791.41 ml  °Output 680 ml  °Net  111.41 ml  °  ° ° °Physical Exam  ° CVP 18-20   °General: Intubated °HEENT:ETT °Neck: supple. JVP to jaw   . Carotids 2+ bilat; no bruits. No lymphadenopathy or thyromegaly appreciated. °Cor: PMI nondisplaced. Regular rate & rhythm. No rubs, gallops or murmurs. °Lungs: clear °Abdomen: soft, nontender, nondistended. No hepatosplenomegaly. No   bruits or masses. Good bowel sounds. °Extremities: no cyanosis, clubbing, rash, edema. RUE PICC  °Neuro: Intubated/ Awake following commands.  ° °Telemetry  ° °SR 90  ° °EKG  °  °Sinus Tach 110 bpm  ° °Labs  ° °Basic Metabolic Panel: °Recent Labs  °Lab 05/24/2019 °1515 05/22/2019 °1834 05/15/2019 °1929 04/25/2019 °0111 05/06/2019 °0424 05/09/2019 °0619  °NA 132* 131* 131* 134* 134* 134*  °K 4.2 4.3 5.1 4.4 5.1 5.8*  °CL 103 102  --   --  107  --   °CO2 15* 14*  --   --  16*  --   °GLUCOSE 149* 345*  --   --  144*  --   °BUN 14 16  --   --  22  --   °CREATININE 1.34* 1.96*  --   --  2.08*  --   °CALCIUM 9.9 9.4  --   --  8.5*  --   °MG  --   --   --   --  2.1  --   °PHOS  --   --   --   --  4.3  --   ° ° °Liver Function Tests: °Recent Labs  °Lab 05/09/2019 °1834 05/06/2019 °0424  °AST 33 70*  °ALT 23 40  °ALKPHOS 108 86  °BILITOT 1.4* 1.8*  °PROT 7.8 6.7  °ALBUMIN 3.6 3.2*  ° °No results for input(s): LIPASE, AMYLASE in the last 168 hours. °No results for input(s): AMMONIA in the last 168 hours. ° °CBC: °Recent Labs  °Lab 05/07/2019 °1515 05/03/2019 °1834 05/20/2019 °1929 04/29/2019 °0111 05/06/2019 °0424 05/22/2019 °0619  °WBC 16.9* 19.6*  --   --  22.2*  --   °NEUTROABS  --  17.1*  --   --   --   --   °HGB 15.0 17.2* 19.7* 17.3* 16.0 17.7*  °HCT 45.0 53.3* 58.0* 51.0 49.2 52.0  °MCV 89.8 94.3  --   --  92.5  --   °PLT 200 213  --   --  193  --   ° ° °Cardiac Enzymes: °No results for input(s): CKTOTAL, CKMB, CKMBINDEX, TROPONINI in the last 168 hours. ° °BNP: °BNP (last 3 results) °Recent Labs  °  05/10/2019 °1739  °BNP 2,725.3*  ° ° °ProBNP (last 3 results) °No results for input(s): PROBNP in the last 8760  hours. ° ° °CBG: °Recent Labs  °Lab 05/24/2019 °2324 04/30/2019 °0421  °GLUCAP 226* 130*  ° ° °Coagulation Studies: °Recent Labs  °  05/07/2019 °0022  °LABPROT 20.0*  °INR 1.7*  ° ° ° °Imaging  ° °Ct Angio Chest Pe W And/or Wo Contrast ° °Result Date: 05/06/2019 °CLINICAL DATA:  Shortness of breath. EXAM: CT ANGIOGRAPHY CHEST WITH CONTRAST TECHNIQUE: Multidetector CT imaging of the chest was performed using the standard protocol during bolus administration of intravenous contrast. Multiplanar CT image reconstructions and MIPs were obtained to evaluate the vascular anatomy. CONTRAST:  64mL OMNIPAQUE IOHEXOL 350 MG/ML SOLN COMPARISON:  12/05/2017 FINDINGS: Cardiovascular: The main pulmonary artery is patent. There is no central obstructing or saddle embolus identified. No lobar pulmonary artery filling defects for segmental pulmonary artery filling defects identified. Normal heart size. No pericardial effusion. Aortic atherosclerosis. 3 vessel coronary artery calcifications. Mediastinum/Nodes: The endotracheal tube tip is above the carina. There is a nasogastric tube with tip in the stomach. No supraclavicular, axillary or mediastinal adenopathy. Diffuse increased soft tissue within bilateral hilar regions identified, new from previous exam. For example, within the right hilar region there   is increased soft tissue measuring 3.7 x 2.9 cm. Within the left suprahilar region there is increased soft tissue measuring 3.1 x 2.0 cm. Lungs/Pleura: There is dense bilateral lower lobe posterior airspace consolidation. There is also bilateral upper lobe posterior subpleural consolidation. Diffuse interlobular septal thickening with areas of ground-glass attenuation identified compatible with pulmonary edema. Upper Abdomen: No acute abnormality. Musculoskeletal: No chest wall abnormality. No acute or significant osseous findings. Review of the MIP images confirms the above findings. IMPRESSION: 1. No evidence for acute pulmonary  embolus. 2. Bilateral lower lobe posterior subpleural consolidation is identified compatible with multifocal pneumonia. 3. Diffuse interlobular septal thickening, ground-glass attenuation and areas of ground-glass attenuation compatible with pulmonary edema which may be secondary to heart failure 4. Interval development of bilateral hilar adenopathy which may be reactive in etiology. Differential considerations include granulomatous inflammation/infection, lymphoproliferative disorder or metastatic adenopathy. This is may also be nonspecific in the setting of CHF. Advise follow-up imaging in 3 months with repeat CT of the chest with contrast. This recommendation follows ACR consensus guidelines: Managing Incidental Findings on Thoracic CT: Mediastinal and Cardiovascular Findings. A White Paper of the ACR Incidental Findings Committee. J Am Coll Radiol. 2018; 15: 5397-6734. 5. 3 vessel coronary artery calcifications noted. Aortic Atherosclerosis (ICD10-I70.0). Electronically Signed   By: Kerby Moors M.D.   On: 05/24/2019 20:18   Dg Chest Portable 1 View  Result Date: 05/09/2019 CLINICAL DATA:  Intubation. Image 2 after pulled back. EXAM: PORTABLE CHEST 1 VIEW COMPARISON:  05/05/2019 FINDINGS: Endotracheal tube has been placed. On the initial image the tube is 3.6 centimeters above the carina. On the second image, tube is approximately 5.4 centimeters above the carina. Nasogastric tube is identified, tip beyond the level of the proximal stomach. Heart size is normal. There are hazy infiltrates throughout the lungs bilaterally, slightly increased since the prior study. Opacity at the LEFT lung base is more confluent. IMPRESSION: 1. Interval placement of endotracheal tube. 2. Increased bilateral infiltrates. 3. More confluent opacity at the LEFT lung base. Electronically Signed   By: Nolon Nations M.D.   On: 05/07/2019 18:57   Dg Chest Port 1 View  Result Date: 05/03/2019 CLINICAL DATA:  Chest pain.  EXAM: PORTABLE CHEST 1 VIEW COMPARISON:  September 18, 2018. FINDINGS: The heart size and mediastinal contours are within normal limits. No pneumothorax or pleural effusion is noted. Multiple small ill-defined interstitial densities are noted throughout both lungs which may represent focal inflammation. The visualized skeletal structures are unremarkable. IMPRESSION: Multiple small ill-defined interstitial densities are noted throughout both lungs which potentially may represent multifocal inflammation or possibly edema. Electronically Signed   By: Marijo Conception M.D.   On: 05/01/2019 16:15   [place] Ekg Site Rite  Result Date: 05/08/2019 If Site Rite image not attached, placement could not be confirmed due to current cardiac rhythm.     Medications:     Current Medications: . aspirin  81 mg Per Tube Daily  . chlorhexidine gluconate (MEDLINE KIT)  15 mL Mouth Rinse BID  . Chlorhexidine Gluconate Cloth  6 each Topical Daily  . gabapentin  100 mg Per Tube Q8H  . insulin aspart  1-3 Units Subcutaneous Q4H  . mouth rinse  15 mL Mouth Rinse 10 times per day  . pantoprazole (PROTONIX) IV  40 mg Intravenous QHS  . sodium bicarbonate  100 mEq Intravenous Once  . sodium chloride flush  10-40 mL Intracatheter Q12H     Infusions: . sodium chloride 20 mL/hr  at 05/19/2019 1614  . ceFEPime (MAXIPIME) IV Stopped (05/12/2019 0118)  . fentaNYL infusion INTRAVENOUS 200 mcg/hr (05/08/2019 0600)  . heparin 900 Units/hr (04/27/2019 0600)  . norepinephrine (LEVOPHED) Adult infusion 1 mcg/min (05/10/2019 0600)  . vancomycin          Assessment/Plan   1. Shock  Septic /Cardiogenic Possible PNA . WBC trending up  Severely reduced EF ~20% - Volume status elevated. Adjust diuretics post cath.  CO-OX stable.  Blood CX pending.  Lactic Acid 4.5>4.7>2.5   2. Acute Respiratory Failure  Failed Bipap and required intubation. Intubated 10/22  3. PNA  Antibiotics per CCM Blood CX pending.    4. CAD, MI 1998   CT - 3V coronary calcifcations HS Trop [phone] Will need cath to further evaluate. Set up today.  On heparin drip.   5. AKI Creatinine trending up 1/3>1.9>2.1   6. Hyperkalemia  K 5.1 on BMET. Watch closely.   7. LBBB  RHC/LHC --> Dr Aundra Dubin set up    Length of Stay: [place], NP  05/24/2019, 7:19 AM  Advanced Heart Failure Team Pager [phone] (M-F; 7a - 4p)  Please contact [facility] for night-coverage after hours (4p -7a ) and weekends on [email or web address]  Patient seen with NP, agree with the above note.   Patient has history of HTN, bilateral subclavian stenosis by ultrasound, reported MI in 1998.  Per his daughter, he had been short of breath for about a week prior to admission, no reported chest pain. In ER, he was started on Bipap then norepinephrine and milrinone for shock with elevated lactate.  ECG showed NSR with LBBB, no prior to review.  HS-TnI 712 => 1456 => 4254. He was intubated.    Overnight, he was weaned off norepinephrine (was only on low dose) and milrinone was stopped.  This morning, co-ox 71%.  However, I measure a CVP of 18.  SBP 120s currently.  He is awake/alert on vent.  He had a CTA chest with pulmonary edema, triple vessel coronary disease, and no PE.  Creatinine 2.08 this morning.   Echo was done this morning and reviewed, EF 15-20% with akinetic inferior wall and anteroseptal wall and apex.  The lateral wall was hypokinetic but best-preserved.  Mildly decreased RV systolic function.  Visually, probably mild aortic stenosis though no significant gradient in setting of low EF.   General: NAD on vent Neck: JVP 14+ cm, no thyromegaly or thyroid nodule.  Lungs: Decreased BS at bases. CV: Nondisplaced PMI.  Heart regular S1/S2, no S3/S4, no murmur.  No peripheral edema.  No carotid bruit.  Normal pedal pulses.  Abdomen: Soft, nontender, no hepatosplenomegaly, no distention.  Skin: Intact without lesions or rashes.  Neurologic: Alert and  follows commands  Extremities: No clubbing or cyanosis.  HEENT: Normal.   1. Shock: Suspect cardiogenic shock.  Echo today with EF 15-20% range with wall motion abnormalities and mildly decreased RV systolic function. Overnight in shock on norepinephrine and milrinone, now off pressors/inotropes for a number of hours with stable BP and co-ox 71%.  CVP 18.  - Patient will go to cath lab this morning, assess for need for mechanical support.  2. Acute on ?chronic systolic CHF: Patient had MI in 1998 but no known cardiac issues since that time.  Echo as above, suspect ischemic cardiomyopathy.  This morning, co-ox 71% off pressors/inotropes with stable BP.  CVP 18.  - Hold off on inotrope for the time being, will have RHC  this morning.  May end up needing mechanical support.  - Will need diuresis, but will not do prior to coronary angiography as creatinine is already elevated at 2.08.  3. CAD: Suspect NSTEMI but he has LBBB of uncertain chronicity.  HS-TnI to 4254. Presented with shortness of breath.  Had MI per daughter in 1998, not sure if he had an intervention. Unfortunately, creatinine is up to 2.08.  - With shock and elevated troponin, will plan coronary angiography this morning.  Will limit contrast as much as possible.  Would not be surprised if he is found to have significant 3 vessel disease based on his echo.  - Statin, ASA.  - Continue heparin gtt.  4. AKI: Creatinine up to 2.08 in setting of shock.  He did get contrast as well with CTA chest yesterday.  Unfortunately, I think that we need to proceed with coronary angiography.  5. H/o subclavian stenosis: Ultrasound in 2019 showed bilateral subclavian stenosis. Will use femoral access.   Loralie Champagne 05/06/2019 8:55 AM

## 2019-05-17 NOTE — Interval H&P Note (Signed)
History and Physical Interval Note:  06/20/2019 9:48 AM  Raymond Chavez  has presented today for surgery, with the diagnosis of Heart failure.  The various methods of treatment have been discussed with the patient and family. After consideration of risks, benefits and other options for treatment, the patient has consented to  Procedure(s): RIGHT/LEFT HEART CATH AND CORONARY ANGIOGRAPHY (N/A) as a surgical intervention.  The patient's history has been reviewed, patient examined, no change in status, stable for surgery.  I have reviewed the patient's chart and labs.  Questions were answered to the patient's satisfaction.     Keilany Burnette [employer]

## 2019-05-17 NOTE — Progress Notes (Signed)
eLink Physician-Brief Progress Note Patient Name: Raymond Chavez DOB: Sep 16, 1950 MRN: [id number]   Date of Service  2019/05/25  HPI/Events of Note  Pt with metabolic acidosis on blood gas obtained immediately following intubation.  eICU Interventions  Will repeat ABG at 5:00 a.m to help guide further adjustments to ventilator and to gauge acid-base status.        Kerry Kass Ogan 05/25/2019, 5:10 AM

## 2019-05-17 NOTE — Progress Notes (Addendum)
Initial Nutrition Assessment  DOCUMENTATION CODES:   Not applicable  INTERVENTION:   ADDENDUM (7628): RD consulted for tube feeding intiation and management. Will order the tube feeding recommendations as listed below.  Tube feeding recommendations: - Vital AF 1.2 @ 60 ml/hr (1440 ml/day)  Tube feeding regimen provides 1728 kcal, 108 grams of protein, and 1168 ml of H2O.   NUTRITION DIAGNOSIS:   Inadequate oral intake related to inability to eat as evidenced by NPO status.  GOAL:   Patient will meet greater than or equal to 90% of their needs  MONITOR:   Vent status, Labs, Weight trends, Skin, I & O's  REASON FOR ASSESSMENT:   Ventilator    ASSESSMENT:   68 year old male who presented to the ED on 10/22 with chest pain and SOB. PMH of CAD, HTN, MI in 1998. Pt admitted with acute hypoxic respiratory failure due to acute pulmonary edema, PNA, possible ACS/NSTEMI. Pt required intubation.   10/23 - s/p right/left heart cath  Discussed pt with RN and during ICU rounds.  OG tube in place to low intermittent suction.  RD has reached out to CCM regarding starting TF today. Awaiting response.  Patient is currently intubated on ventilator support MV: 9.6 L/min Temp (24hrs), Avg:98.2 F (36.8 C), Min:95.2 F (35.1 C), Max:100 F (37.8 C) BP (cuff): 118/57 MAP (cuff): 105  Drips: Versed: 1 ml/hr Fentanyl: 20 ml/hr Levophed: off this AM  Medications reviewed and include: SSI, Protonix, Veltassa daily, IV abx  Labs reviewed: potassium 5.7, BUN 26, creatinine 2.31 CBG's: 130-226 x 24 hours  UOP: 530 ml x 24 hours OGT: 400 ml x 24 hours  NUTRITION - FOCUSED PHYSICAL EXAM:  Unable to complete. Pt in cath lab at time of RD visit.  Diet Order:   Diet Order    None      EDUCATION NEEDS:   No education needs have been identified at this time  Skin:  Skin Assessment: Reviewed RN Assessment  Last BM:  no documented BM  Height:   Ht Readings from Last 1  Encounters:  June 10, 2019 5\' 7"  (1.702 m)    Weight:   Wt Readings from Last 1 Encounters:  05/13/2019 70.5 kg    Ideal Body Weight:  67.3 kg  BMI:  Body mass index is 24.34 kg/m.  Estimated Nutritional Needs:   Kcal:  3151  Protein:  90-110 grams  Fluid:  >/= 1.7 L    Gaynell Face, MS, RD, LDN Inpatient Clinical Dietitian Pager: [phone] Weekend/After Hours: [phone]

## 2019-05-17 NOTE — Progress Notes (Signed)
eLink Physician-Brief Progress Note Patient Name: Raymond Chavez DOB: [birth year] MRN: [id number]   Date of Service  05/07/2019  HPI/Events of Note  Persistent acidosis on ABG impacting LV function and BP  eICU Interventions  NaHCO3 1 amp iv         Bartosz Luginbill U Medina Degraffenreid 05/05/2019, 6:56 AM

## 2019-05-17 NOTE — Progress Notes (Signed)
Pt transported from [place] to cath lab and back without complications.

## 2019-05-17 NOTE — Procedures (Signed)
Arterial Catheter Insertion Procedure Note Sahith Nurse [id number] 19-Oct-1950  Procedure: Insertion of Arterial Catheter  Indications: Blood pressure monitoring and Frequent blood sampling  Procedure Details Consent: Unable to obtain consent because of altered level of consciousness. Time Out: Verified patient identification, verified procedure, site/side was marked, verified correct patient position, special equipment/implants available, medications/allergies/relevent history reviewed, required imaging and test results available.  Performed  Maximum sterile technique was used including antiseptics, cap, gloves, gown, hand hygiene, mask and sheet. Skin prep: Chlorhexidine; local anesthetic administered 20 gauge catheter was inserted into left radial artery using the Seldinger technique. ULTRASOUND GUIDANCE USED: NO Evaluation Blood flow good; BP tracing good. Complications: No apparent complications.   Donnetta Hail 05/02/2019

## 2019-05-17 NOTE — H&P (View-Only) (Signed)
Advanced Heart Failure Team Consult Note   Primary Physician: Alroy Dust, L.Marlou Sa, MD PCP-Cardiologist:  No primary care provider on file.  Reason for Consultation: Cardiogenic Shock   HPI:    Raymond Chavez is seen today for evaluation of cardiogenic at the request of Dr Marletta Lor   Mr Marletta Lor is a 68 year old with history of CAD S/P MI 1998, HTN, carotid disease.   Presented to [facility] with worsening shortness of breath over the last 2-3 weeks. Initially on Bipap but continued to decline requiring intubation. CTA with no PE, multifocal pneumonia, 3 vessel coronary artery calcifications, and ground glass attenuation.  Placed on NTG drip and given IV lasix. NTG stopped. Started on norepi but weaned off. Pertinent admission labs included: HS Trop 168>[id number]> 4254, BNP 2725, WBC 16.9, Hgb 15, and lacitc acid 4.5. Covid negative.   Echo today per Dr Aundra Dubin - EF ~15-20% with RV dysfunction   Review of Systems: [y] = yes, '[ ]'  = no Patient is encephalopathic and or intubated. Therefore history has been obtained from chart review.    General: Weight gain '[ ]' ; Weight loss '[ ]' ; Anorexia '[ ]' ; Fatigue '[ ]' ; Fever '[ ]' ; Chills '[ ]' ; Weakness '[ ]'    Cardiac: Chest pain/pressure '[ ]' ; Resting SOB '[ ]' ; Exertional SOB '[ ]' ; Orthopnea '[ ]' ; Pedal Edema '[ ]' ; Palpitations '[ ]' ; Syncope '[ ]' ; Presyncope '[ ]' ; Paroxysmal nocturnal dyspnea'[ ]'    Pulmonary: Cough '[ ]' ; Wheezing'[ ]' ; Hemoptysis'[ ]' ; Sputum '[ ]' ; Snoring '[ ]'    GI: Vomiting'[ ]' ; Dysphagia'[ ]' ; Melena'[ ]' ; Hematochezia '[ ]' ; Heartburn'[ ]' ; Abdominal pain '[ ]' ; Constipation '[ ]' ; Diarrhea '[ ]' ; BRBPR '[ ]'    GU: Hematuria'[ ]' ; Dysuria '[ ]' ; Nocturia'[ ]'    Vascular: Pain in legs with walking '[ ]' ; Pain in feet with lying flat '[ ]' ; Non-healing sores '[ ]' ; Stroke '[ ]' ; TIA '[ ]' ; Slurred speech '[ ]' ;   Neuro: Headaches'[ ]' ; Vertigo'[ ]' ; Seizures'[ ]' ; Paresthesias'[ ]' ;Blurred vision '[ ]' ; Diplopia '[ ]' ; Vision changes '[ ]'    Ortho/Skin: Arthritis '[ ]' ; Joint pain '[ ]' ; Muscle pain '[ ]' ; Joint  swelling '[ ]' ; Back Pain '[ ]' ; Rash '[ ]'    Psych: Depression'[ ]' ; Anxiety'[ ]'    Heme: Bleeding problems '[ ]' ; Clotting disorders '[ ]' ; Anemia '[ ]'    Endocrine: Diabetes '[ ]' ; Thyroid dysfunction'[ ]'   Home Medications Prior to Admission medications   Medication Sig Start Date End Date Taking? Authorizing Provider  albuterol (PROAIR HFA) 108 (90 Base) MCG/ACT inhaler Inhale 2 puffs into the lungs every 4 (four) hours as needed for wheezing or shortness of breath.    Yes [provider]  amLODipine (NORVASC) 5 MG tablet Take 5 mg by mouth daily. 12/04/17  Yes [provider]  aspirin EC 81 MG tablet Take 81 mg by mouth daily.   Yes [provider]  atorvastatin (LIPITOR) 40 MG tablet Take 40 mg by mouth at bedtime. 03/06/19  Yes [provider]  Azelastine HCl 137 MCG/SPRAY SOLN Place 1 spray into both nostrils 2 (two) times daily as needed (for allergic symptoms or rhinitis).  03/28/19  Yes [provider]  predniSONE (DELTASONE) 20 MG tablet Take 20-60 mg by mouth See admin instructions. Take 60 mg by mouth once a day for 3 days, 40 mg once a day for 3 days, then 20 mg once a day for 3 days 05/15/19  Yes [provider]  SPIRIVA RESPIMAT 1.25 MCG/ACT AERS Inhale  2 puffs into the lungs daily. 04/23/19  Yes [provider]  hydrochlorothiazide (HYDRODIURIL) 12.5 MG tablet Take 12.5 mg by mouth daily.    [provider]  lisinopril (PRINIVIL,ZESTRIL) 20 MG tablet Take 1 tablet (20 mg total) by mouth daily. Patient not taking: Reported on 05/13/2019 11/28/13   Liam Graham, PA-C    Past Medical History: Past Medical History:  Diagnosis Date   Carotid artery occlusion    Hypertension     Past Surgical History: No past surgical history on file.  Family History: No family history on file.  Social History: Social History   Socioeconomic History   Marital status: Divorced    Spouse name: Not on file   Number of children: 2    Years of education: Not on file   Highest education level: Not on file  Occupational History   Not on file  Social Needs   Financial resource strain: Not on file   Food insecurity    Worry: Not on file    Inability: Not on file   Transportation needs    Medical: Not on file    Non-medical: Not on file  Tobacco Use   Smoking status: Light Tobacco Smoker    Packs/day: 0.00    Years: 40.00    Pack years: 0.00    Types: Cigarettes    Last attempt to quit: 10/23/2017    Years since quitting: 1.5   Smokeless tobacco: Never Used  Substance and Sexual Activity   Alcohol use: Yes    Alcohol/week: 6.0 - 12.0 standard drinks    Types: 6 - 12 Cans of beer per week   Drug use: No   Sexual activity: Yes  Lifestyle   Physical activity    Days per week: Not on file    Minutes per session: Not on file   Stress: Not on file  Relationships   Social connections    Talks on phone: Not on file    Gets together: Not on file    Attends religious service: Not on file    Active member of club or organization: Not on file    Attends meetings of clubs or organizations: Not on file    Relationship status: Not on file  Other Topics Concern   Not on file  Social History Narrative   Not on file    Allergies:  No Known Allergies  Objective:    Vital Signs:   Temp:  [95.2 F (35.1 C)-99.9 F (37.7 C)] 99.9 F (37.7 C) (10/23 0700) Pulse Rate:  [85-137] 89 (10/23 0421) Resp:  [16-43] 17 (10/23 0700) BP: (86-134)/(68-99) 115/89 (10/23 0700) SpO2:  [87 %-100 %] 96 % (10/23 0700) FiO2 (%):  [50 %-100 %] 60 % (10/23 0421) Weight:  [70.5 kg-72.6 kg] 70.5 kg (10/23 0545) Last BM Date: (PTA)  Weight change: Filed Weights   05/02/2019 1600 05/09/2019 2030 05/01/2019 0545  Weight: 72.6 kg 72.6 kg 70.5 kg    Intake/Output:   Intake/Output Summary (Last 24 hours) at 05/15/2019 0719 Last data filed at 05/10/2019 0600 Gross per 24 hour  Intake 791.41 ml  Output 680 ml  Net  111.41 ml      Physical Exam   CVP 18-20   General: Intubated HEENT:ETT Neck: supple. JVP to jaw   . Carotids 2+ bilat; no bruits. No lymphadenopathy or thyromegaly appreciated. Cor: PMI nondisplaced. Regular rate & rhythm. No rubs, gallops or murmurs. Lungs: clear Abdomen: soft, nontender, nondistended. No hepatosplenomegaly. No  bruits or masses. Good bowel sounds. Extremities: no cyanosis, clubbing, rash, edema. RUE PICC  Neuro: Intubated/ Awake following commands.   Telemetry   SR 90   EKG    Sinus Tach 110 bpm   Labs   Basic Metabolic Panel: Recent Labs  Lab 05/15/2019 1515 05/11/2019 1834 05/22/2019 1929 05/03/2019 0111 04/27/2019 0424 05/02/2019 0619  NA 132* 131* 131* 134* 134* 134*  K 4.2 4.3 5.1 4.4 5.1 5.8*  CL 103 102  --   --  107  --   CO2 15* 14*  --   --  16*  --   GLUCOSE 149* 345*  --   --  144*  --   BUN 14 16  --   --  22  --   CREATININE 1.34* 1.96*  --   --  2.08*  --   CALCIUM 9.9 9.4  --   --  8.5*  --   MG  --   --   --   --  2.1  --   PHOS  --   --   --   --  4.3  --     Liver Function Tests: Recent Labs  Lab 04/30/2019 1834 05/11/2019 0424  AST 33 70*  ALT 23 40  ALKPHOS 108 86  BILITOT 1.4* 1.8*  PROT 7.8 6.7  ALBUMIN 3.6 3.2*   No results for input(s): LIPASE, AMYLASE in the last 168 hours. No results for input(s): AMMONIA in the last 168 hours.  CBC: Recent Labs  Lab 05/20/2019 1515 05/12/2019 1834 05/24/2019 1929 05/21/2019 0111 04/28/2019 0424 05/01/2019 0619  WBC 16.9* 19.6*  --   --  22.2*  --   NEUTROABS  --  17.1*  --   --   --   --   HGB 15.0 17.2* 19.7* 17.3* 16.0 17.7*  HCT 45.0 53.3* 58.0* 51.0 49.2 52.0  MCV 89.8 94.3  --   --  92.5  --   PLT 200 213  --   --  193  --     Cardiac Enzymes: No results for input(s): CKTOTAL, CKMB, CKMBINDEX, TROPONINI in the last 168 hours.  BNP: BNP (last 3 results) Recent Labs    05/01/2019 1739  BNP 2,725.3*    ProBNP (last 3 results) No results for input(s): PROBNP in the last 8760  hours.   CBG: Recent Labs  Lab 05/14/2019 2324 05/14/2019 0421  GLUCAP 226* 130*    Coagulation Studies: Recent Labs    05/09/2019 0022  LABPROT 20.0*  INR 1.7*     Imaging   Ct Angio Chest Pe W And/or Wo Contrast  Result Date: 04/26/2019 CLINICAL DATA:  Shortness of breath. EXAM: CT ANGIOGRAPHY CHEST WITH CONTRAST TECHNIQUE: Multidetector CT imaging of the chest was performed using the standard protocol during bolus administration of intravenous contrast. Multiplanar CT image reconstructions and MIPs were obtained to evaluate the vascular anatomy. CONTRAST:  70m OMNIPAQUE IOHEXOL 350 MG/ML SOLN COMPARISON:  12/05/2017 FINDINGS: Cardiovascular: The main pulmonary artery is patent. There is no central obstructing or saddle embolus identified. No lobar pulmonary artery filling defects for segmental pulmonary artery filling defects identified. Normal heart size. No pericardial effusion. Aortic atherosclerosis. 3 vessel coronary artery calcifications. Mediastinum/Nodes: The endotracheal tube tip is above the carina. There is a nasogastric tube with tip in the stomach. No supraclavicular, axillary or mediastinal adenopathy. Diffuse increased soft tissue within bilateral hilar regions identified, new from previous exam. For example, within the right hilar region there  is increased soft tissue measuring 3.7 x 2.9 cm. Within the left suprahilar region there is increased soft tissue measuring 3.1 x 2.0 cm. Lungs/Pleura: There is dense bilateral lower lobe posterior airspace consolidation. There is also bilateral upper lobe posterior subpleural consolidation. Diffuse interlobular septal thickening with areas of ground-glass attenuation identified compatible with pulmonary edema. Upper Abdomen: No acute abnormality. Musculoskeletal: No chest wall abnormality. No acute or significant osseous findings. Review of the MIP images confirms the above findings. IMPRESSION: 1. No evidence for acute pulmonary  embolus. 2. Bilateral lower lobe posterior subpleural consolidation is identified compatible with multifocal pneumonia. 3. Diffuse interlobular septal thickening, ground-glass attenuation and areas of ground-glass attenuation compatible with pulmonary edema which may be secondary to heart failure 4. Interval development of bilateral hilar adenopathy which may be reactive in etiology. Differential considerations include granulomatous inflammation/infection, lymphoproliferative disorder or metastatic adenopathy. This is may also be nonspecific in the setting of CHF. Advise follow-up imaging in 3 months with repeat CT of the chest with contrast. This recommendation follows ACR consensus guidelines: Managing Incidental Findings on Thoracic CT: Mediastinal and Cardiovascular Findings. A White Paper of the ACR Incidental Findings Committee. J Am Coll Radiol. 2018; 15: 4580-9983. 5. 3 vessel coronary artery calcifications noted. Aortic Atherosclerosis (ICD10-I70.0). Electronically Signed   By: Kerby Moors M.D.   On: 05/24/2019 20:18   Dg Chest Portable 1 View  Result Date: 05/09/2019 CLINICAL DATA:  Intubation. Image 2 after pulled back. EXAM: PORTABLE CHEST 1 VIEW COMPARISON:  04/28/2019 FINDINGS: Endotracheal tube has been placed. On the initial image the tube is 3.6 centimeters above the carina. On the second image, tube is approximately 5.4 centimeters above the carina. Nasogastric tube is identified, tip beyond the level of the proximal stomach. Heart size is normal. There are hazy infiltrates throughout the lungs bilaterally, slightly increased since the prior study. Opacity at the LEFT lung base is more confluent. IMPRESSION: 1. Interval placement of endotracheal tube. 2. Increased bilateral infiltrates. 3. More confluent opacity at the LEFT lung base. Electronically Signed   By: Nolon Nations M.D.   On: 05/02/2019 18:57   Dg Chest Port 1 View  Result Date: 05/03/2019 CLINICAL DATA:  Chest pain.  EXAM: PORTABLE CHEST 1 VIEW COMPARISON:  September 18, 2018. FINDINGS: The heart size and mediastinal contours are within normal limits. No pneumothorax or pleural effusion is noted. Multiple small ill-defined interstitial densities are noted throughout both lungs which may represent focal inflammation. The visualized skeletal structures are unremarkable. IMPRESSION: Multiple small ill-defined interstitial densities are noted throughout both lungs which potentially may represent multifocal inflammation or possibly edema. Electronically Signed   By: Marijo Conception M.D.   On: 05/03/2019 16:15   [place] Ekg Site Rite  Result Date: 05/03/2019 If Site Rite image not attached, placement could not be confirmed due to current cardiac rhythm.     Medications:     Current Medications:  aspirin  81 mg Per Tube Daily   chlorhexidine gluconate (MEDLINE KIT)  15 mL Mouth Rinse BID   Chlorhexidine Gluconate Cloth  6 each Topical Daily   gabapentin  100 mg Per Tube Q8H   insulin aspart  1-3 Units Subcutaneous Q4H   mouth rinse  15 mL Mouth Rinse 10 times per day   pantoprazole (PROTONIX) IV  40 mg Intravenous QHS   sodium bicarbonate  100 mEq Intravenous Once   sodium chloride flush  10-40 mL Intracatheter Q12H     Infusions:  sodium chloride 20 mL/hr  at 05/02/2019 1614   ceFEPime (MAXIPIME) IV Stopped (05/08/2019 0118)   fentaNYL infusion INTRAVENOUS 200 mcg/hr (05/09/2019 0600)   heparin 900 Units/hr (04/28/2019 0600)   norepinephrine (LEVOPHED) Adult infusion 1 mcg/min (05/08/2019 0600)   vancomycin          Assessment/Plan   1. Shock  Septic /Cardiogenic Possible PNA . WBC trending up  Severely reduced EF ~20% - Volume status elevated. Adjust diuretics post cath.  CO-OX stable.  Blood CX pending.  Lactic Acid 4.5>4.7>2.5   2. Acute Respiratory Failure  Failed Bipap and required intubation. Intubated 10/22  3. PNA  Antibiotics per CCM Blood CX pending.    4. CAD, MI 1998   CT - 3V coronary calcifcations HS Trop [phone] Will need cath to further evaluate. Set up today.  On heparin drip.   5. AKI Creatinine trending up 1/3>1.9>2.1   6. Hyperkalemia  K 5.1 on BMET. Watch closely.   7. LBBB  RHC/LHC --> Dr Aundra Dubin set up    Length of Stay: [place], NP  04/27/2019, 7:19 AM  Advanced Heart Failure Team Pager [phone] (M-F; 7a - 4p)  Please contact [facility] for night-coverage after hours (4p -7a ) and weekends on [email or web address]  Patient seen with NP, agree with the above note.   Patient has history of HTN, bilateral subclavian stenosis by ultrasound, reported MI in 1998.  Per his daughter, he had been short of breath for about a week prior to admission, no reported chest pain. In ER, he was started on Bipap then norepinephrine and milrinone for shock with elevated lactate.  ECG showed NSR with LBBB, no prior to review.  HS-TnI 712 => 1456 => 4254. He was intubated.    Overnight, he was weaned off norepinephrine (was only on low dose) and milrinone was stopped.  This morning, co-ox 71%.  However, I measure a CVP of 18.  SBP 120s currently.  He is awake/alert on vent.  He had a CTA chest with pulmonary edema, triple vessel coronary disease, and no PE.  Creatinine 2.08 this morning.   Echo was done this morning and reviewed, EF 15-20% with akinetic inferior wall and anteroseptal wall and apex.  The lateral wall was hypokinetic but best-preserved.  Mildly decreased RV systolic function.  Visually, probably mild aortic stenosis though no significant gradient in setting of low EF.   General: NAD on vent Neck: JVP 14+ cm, no thyromegaly or thyroid nodule.  Lungs: Decreased BS at bases. CV: Nondisplaced PMI.  Heart regular S1/S2, no S3/S4, no murmur.  No peripheral edema.  No carotid bruit.  Normal pedal pulses.  Abdomen: Soft, nontender, no hepatosplenomegaly, no distention.  Skin: Intact without lesions or rashes.  Neurologic: Alert and  follows commands  Extremities: No clubbing or cyanosis.  HEENT: Normal.   1. Shock: Suspect cardiogenic shock.  Echo today with EF 15-20% range with wall motion abnormalities and mildly decreased RV systolic function. Overnight in shock on norepinephrine and milrinone, now off pressors/inotropes for a number of hours with stable BP and co-ox 71%.  CVP 18.  - Patient will go to cath lab this morning, assess for need for mechanical support.  2. Acute on ?chronic systolic CHF: Patient had MI in 1998 but no known cardiac issues since that time.  Echo as above, suspect ischemic cardiomyopathy.  This morning, co-ox 71% off pressors/inotropes with stable BP.  CVP 18.  - Hold off on inotrope for the time being, will have RHC  this morning.  May end up needing mechanical support.  - Will need diuresis, but will not do prior to coronary angiography as creatinine is already elevated at 2.08.  3. CAD: Suspect NSTEMI but he has LBBB of uncertain chronicity.  HS-TnI to 4254. Presented with shortness of breath.  Had MI per daughter in 1998, not sure if he had an intervention. Unfortunately, creatinine is up to 2.08.  - With shock and elevated troponin, will plan coronary angiography this morning.  Will limit contrast as much as possible.  Would not be surprised if he is found to have significant 3 vessel disease based on his echo.  - Statin, ASA.  - Continue heparin gtt.  4. AKI: Creatinine up to 2.08 in setting of shock.  He did get contrast as well with CTA chest yesterday.  Unfortunately, I think that we need to proceed with coronary angiography.  5. H/o subclavian stenosis: Ultrasound in 2019 showed bilateral subclavian stenosis. Will use femoral access.   Loralie Champagne 05/19/2019 8:55 AM

## 2019-05-17 NOTE — Progress Notes (Signed)
ANTICOAGULATION CONSULT NOTE - Follow Up Consult  Pharmacy Consult for Heparin  Indication: Impella  No Known Allergies  Patient Measurements: Height: 5\' 7"  (170.2 cm)(measured x3) Weight: 155 lb 6.8 oz (70.5 kg) IBW/kg (Calculated) : 66.1  Vital Signs: Temp: 98.8 F (37.1 C) (10/23 2005) Temp Source: Bladder (10/23 1600) BP: 89/70 (10/23 1600) Pulse Rate: 92 (10/23 1227)  Labs: Recent Labs    05/04/2019 0022  04/26/2019 1834  04/26/2019 2055 05/08/2019 2325  05/22/2019 0424 05/23/2019 0504  05/15/2019 0900  05/25/2019 1016 05/24/2019 1234 05/20/2019 1504 05/25/2019 1600 05/21/2019 1636 05/02/2019 1913  HGB  --    < > 17.2*   < >  --   --    < > 16.0  --    < > 16.3   < > 17.0  --  14.7  --  14.6  --   HCT  --    < > 53.3*   < >  --   --    < > 49.2  --    < > 51.1   < > 50.0  --  45.8  --  43.0  --   PLT  --    < > 213  --   --   --   --  193  --   --  204  --   --   --  149*  --   --   --   APTT 52*  --   --   --   --   --   --   --   --   --   --   --   --   --   --   --   --   --   LABPROT 20.0*  --   --   --   --   --   --   --   --   --   --   --   --   --   --   --   --   --   INR 1.7*  --   --   --   --   --   --   --   --   --   --   --   --   --   --   --   --   --   HEPARINUNFRC  --   --   --   --   --   --   --   --  0.17*  --   --   --   --   --   --   --   --   --   CREATININE  --    < > 1.96*  --   --   --   --  2.08*  --   --  2.31*  --   --  2.23*  --  2.48*  --  2.49*  TROPONINIHS  --    < > 712*  --  1,456* 4,254*  --   --   --   --   --   --   --   --   --   --   --   --    < > = values in this interval not displayed.    Estimated Creatinine Clearance: 26.2 mL/min (A) (by C-G formula based on SCr of 2.49 mg/dL (H)).   Assessment: 51 yoM admitted with shock and concern  for ACS now s/p Impella CP placement in cath lab. Pharmacy to assist with heparin dosing. -purge rate: 12.6 (630 units/hr of heparin) -systemic heparin: 800 units/hr -ACT= 147 (prior ACT was  147)   Goal of Therapy:  ACT 160-180 Monitor platelets by anticoagulation protocol: Yes   Plan:  -Since ACT has not moved will give a small heparin bolus (150 units x1) and increase by 100 units/hr -ACT q1h  Harland German, PharmD Clinical Pharmacist **Pharmacist phone directory can now be found on [email or web address] ([email or web address]).  Listed under [facility].

## 2019-05-17 NOTE — Progress Notes (Signed)
  Echocardiogram 2D Echocardiogram has been performed.  Raymond Chavez 05/06/2019, 8:31 AM

## 2019-05-17 NOTE — Progress Notes (Signed)
  Urine looking more tea-colored, echo done showing Impella catheter in too deep. Catheter withdrawn and left at 3.6 cm from the aortic valve.  Flow increased to 3.7 L/min on P8.    Milrinone has been started.  Lasix 80 mg IV x 1 given.   Loralie Champagne 04/28/2019 3:21 PM

## 2019-05-17 NOTE — Progress Notes (Signed)
Echocardiogram 2D Echocardiogram has been performed.  Oneal Deputy Jade Burright 05/10/2019, 12:41 PM

## 2019-05-17 NOTE — Progress Notes (Addendum)
ANTICOAGULATION CONSULT NOTE - Follow Up Consult  Pharmacy Consult for Heparin  Indication: Impella  No Known Allergies  Patient Measurements: Height: 5\' 7"  (170.2 cm)(measured x3) Weight: 155 lb 6.8 oz (70.5 kg) IBW/kg (Calculated) : 66.1  Vital Signs: Temp: 100 F (37.8 C) (10/23 0900) Temp Source: Bladder (10/23 0800) BP: 107/81 (10/23 1149) Pulse Rate: 88 (10/23 1149)  Labs: Recent Labs    04/26/2019 0022  05/02/2019 1834  05/15/2019 2055 05/21/2019 2325  04/26/2019 0424 05/23/2019 0504 05/08/2019 0619 04/29/2019 0900  HGB  --    < > 17.2*   < >  --   --    < > 16.0  --  17.7* 16.3  HCT  --    < > 53.3*   < >  --   --    < > 49.2  --  52.0 51.1  PLT  --    < > 213  --   --   --   --  193  --   --  204  APTT 52*  --   --   --   --   --   --   --   --   --   --   LABPROT 20.0*  --   --   --   --   --   --   --   --   --   --   INR 1.7*  --   --   --   --   --   --   --   --   --   --   HEPARINUNFRC  --   --   --   --   --   --   --   --  0.17*  --   --   CREATININE  --    < > 1.96*  --   --   --   --  2.08*  --   --  2.31*  TROPONINIHS  --    < > 712*  --  1,456* 4,254*  --   --   --   --   --    < > = values in this interval not displayed.    Estimated Creatinine Clearance: 28.2 mL/min (A) (by C-G formula based on SCr of 2.31 mg/dL (H)).   Assessment: 18 yoM admitted with shock and concern for ACS now s/p Impella CP placement in cath lab. Pharmacy to assist with heparin dosing.  Heparin via purge to be started now, will follow-up ACT and add systemic heparin slowly as needed pending ACT results.  Goal of Therapy:  ACT 160-180 Monitor platelets by anticoagulation protocol: Yes   Plan:  -Heparin via purge -Systemic heparin as needed via nursing protocol -Pharmacy will follow peripherally   ADDENDUM: Cr worsening, will adjust vanco to 750mg  IV q24h - est AUC 520, cefepime 2g q24h  Arrie Senate, PharmD, BCPS Clinical Pharmacist [phone] Please check AMION for all [facility]  Pharmacy numbers 05/24/2019

## 2019-05-17 NOTE — Progress Notes (Signed)
  Echocardiogram 2D Echocardiogram has been performed.  Raymond Chavez 05/05/2019, 3:33 PM

## 2019-05-18 ENCOUNTER — Inpatient Hospital Stay (HOSPITAL_COMMUNITY): Payer: PPO | Admitting: Anesthesiology

## 2019-05-18 ENCOUNTER — Inpatient Hospital Stay (HOSPITAL_COMMUNITY): Payer: PPO

## 2019-05-18 ENCOUNTER — Encounter (HOSPITAL_COMMUNITY): Admission: EM | Disposition: E | Payer: Self-pay | Source: Home / Self Care | Attending: Cardiothoracic Surgery

## 2019-05-18 ENCOUNTER — Encounter (HOSPITAL_COMMUNITY): Payer: Self-pay | Admitting: Anesthesiology

## 2019-05-18 DIAGNOSIS — I509 Heart failure, unspecified: Secondary | ICD-10-CM

## 2019-05-18 DIAGNOSIS — J9601 Acute respiratory failure with hypoxia: Secondary | ICD-10-CM | POA: Diagnosis not present

## 2019-05-18 HISTORY — PX: GROIN DISSECTION: SHX5250

## 2019-05-18 HISTORY — PX: INSERTION OF IMPLANTABLE LEFT VENTRICULAR ASSIST DEVICE: SHX5866

## 2019-05-18 LAB — CBC WITH DIFFERENTIAL/PLATELET
Abs Immature Granulocytes: 0.21 10*3/uL — ABNORMAL HIGH (ref 0.00–0.07)
Basophils Absolute: 0 10*3/uL (ref 0.0–0.1)
Basophils Relative: 0 %
Eosinophils Absolute: 0 10*3/uL (ref 0.0–0.5)
Eosinophils Relative: 0 %
HCT: 33.3 % — ABNORMAL LOW (ref 39.0–52.0)
Hemoglobin: 11.7 g/dL — ABNORMAL LOW (ref 13.0–17.0)
Immature Granulocytes: 1 %
Lymphocytes Relative: 13 %
Lymphs Abs: 2.9 10*3/uL (ref 0.7–4.0)
MCH: 30.7 pg (ref 26.0–34.0)
MCHC: 35.1 g/dL (ref 30.0–36.0)
MCV: 87.4 fL (ref 80.0–100.0)
Monocytes Absolute: 2.4 10*3/uL — ABNORMAL HIGH (ref 0.1–1.0)
Monocytes Relative: 10 %
Neutro Abs: 17.4 10*3/uL — ABNORMAL HIGH (ref 1.7–7.7)
Neutrophils Relative %: 76 %
Platelets: 120 10*3/uL — ABNORMAL LOW (ref 150–400)
RBC: 3.81 MIL/uL — ABNORMAL LOW (ref 4.22–5.81)
RDW: 13.7 % (ref 11.5–15.5)
WBC: 22.9 10*3/uL — ABNORMAL HIGH (ref 4.0–10.5)
nRBC: 0.2 % (ref 0.0–0.2)

## 2019-05-18 LAB — POCT I-STAT 7, (LYTES, BLD GAS, ICA,H+H)
Acid-Base Excess: 6 mmol/L — ABNORMAL HIGH (ref 0.0–2.0)
Acid-Base Excess: 9 mmol/L — ABNORMAL HIGH (ref 0.0–2.0)
Acid-Base Excess: 7 mmol/L — ABNORMAL HIGH (ref 0.0–2.0)
Acid-Base Excess: 6 mmol/L — ABNORMAL HIGH (ref 0.0–2.0)
Acid-Base Excess: 6 mmol/L — ABNORMAL HIGH (ref 0.0–2.0)
Bicarbonate: 29.5 mmol/L — ABNORMAL HIGH (ref 20.0–28.0)
Bicarbonate: 33.4 mmol/L — ABNORMAL HIGH (ref 20.0–28.0)
Bicarbonate: 32.2 mmol/L — ABNORMAL HIGH (ref 20.0–28.0)
Bicarbonate: 30 mmol/L — ABNORMAL HIGH (ref 20.0–28.0)
Bicarbonate: 29.7 mmol/L — ABNORMAL HIGH (ref 20.0–28.0)
Calcium, Ion: 1.14 mmol/L — ABNORMAL LOW (ref 1.15–1.40)
Calcium, Ion: 1.13 mmol/L — ABNORMAL LOW (ref 1.15–1.40)
Calcium, Ion: 1.14 mmol/L — ABNORMAL LOW (ref 1.15–1.40)
Calcium, Ion: 1.13 mmol/L — ABNORMAL LOW (ref 1.15–1.40)
Calcium, Ion: 1.12 mmol/L — ABNORMAL LOW (ref 1.15–1.40)
HCT: 39 % (ref 39.0–52.0)
HCT: 40 % (ref 39.0–52.0)
HCT: 36 % — ABNORMAL LOW (ref 39.0–52.0)
HCT: 33 % — ABNORMAL LOW (ref 39.0–52.0)
HCT: 32 % — ABNORMAL LOW (ref 39.0–52.0)
Hemoglobin: 13.3 g/dL (ref 13.0–17.0)
Hemoglobin: 13.6 g/dL (ref 13.0–17.0)
Hemoglobin: 12.2 g/dL — ABNORMAL LOW (ref 13.0–17.0)
Hemoglobin: 11.2 g/dL — ABNORMAL LOW (ref 13.0–17.0)
Hemoglobin: 10.9 g/dL — ABNORMAL LOW (ref 13.0–17.0)
O2 Saturation: 98 %
O2 Saturation: 97 %
O2 Saturation: 100 %
O2 Saturation: 100 %
O2 Saturation: 99 %
Patient temperature: 37.4
Patient temperature: 37.3
Patient temperature: 37
Potassium: 3.7 mmol/L (ref 3.5–5.1)
Potassium: 3.5 mmol/L (ref 3.5–5.1)
Potassium: 3.4 mmol/L — ABNORMAL LOW (ref 3.5–5.1)
Potassium: 3.5 mmol/L (ref 3.5–5.1)
Potassium: 3.5 mmol/L (ref 3.5–5.1)
Sodium: 140 mmol/L (ref 135–145)
Sodium: 137 mmol/L (ref 135–145)
Sodium: 138 mmol/L (ref 135–145)
Sodium: 138 mmol/L (ref 135–145)
Sodium: 136 mmol/L (ref 135–145)
TCO2: 31 mmol/L (ref 22–32)
TCO2: 35 mmol/L — ABNORMAL HIGH (ref 22–32)
TCO2: 34 mmol/L — ABNORMAL HIGH (ref 22–32)
TCO2: 31 mmol/L (ref 22–32)
TCO2: 31 mmol/L (ref 22–32)
pCO2 arterial: 39.8 mmHg (ref 32.0–48.0)
pCO2 arterial: 43.8 mmHg (ref 32.0–48.0)
pCO2 arterial: 47.5 mmHg (ref 32.0–48.0)
pCO2 arterial: 42 mmHg (ref 32.0–48.0)
pCO2 arterial: 39.9 mmHg (ref 32.0–48.0)
pH, Arterial: 7.48 — ABNORMAL HIGH (ref 7.350–7.450)
pH, Arterial: 7.491 — ABNORMAL HIGH (ref 7.350–7.450)
pH, Arterial: 7.44 (ref 7.350–7.450)
pH, Arterial: 7.461 — ABNORMAL HIGH (ref 7.350–7.450)
pH, Arterial: 7.48 — ABNORMAL HIGH (ref 7.350–7.450)
pO2, Arterial: 106 mmHg (ref 83.0–108.0)
pO2, Arterial: 90 mmHg (ref 83.0–108.0)
pO2, Arterial: 247 mmHg — ABNORMAL HIGH (ref 83.0–108.0)
pO2, Arterial: 164 mmHg — ABNORMAL HIGH (ref 83.0–108.0)
pO2, Arterial: 124 mmHg — ABNORMAL HIGH (ref 83.0–108.0)

## 2019-05-18 LAB — CBC
HCT: 38.6 % — ABNORMAL LOW (ref 39.0–52.0)
Hemoglobin: 13.2 g/dL (ref 13.0–17.0)
MCH: 30.4 pg (ref 26.0–34.0)
MCHC: 34.2 g/dL (ref 30.0–36.0)
MCV: 88.9 fL (ref 80.0–100.0)
Platelets: 139 10*3/uL — ABNORMAL LOW (ref 150–400)
RBC: 4.34 MIL/uL (ref 4.22–5.81)
RDW: 13.7 % (ref 11.5–15.5)
WBC: 19 10*3/uL — ABNORMAL HIGH (ref 4.0–10.5)
nRBC: 0.1 % (ref 0.0–0.2)

## 2019-05-18 LAB — LACTATE DEHYDROGENASE
LDH: 1448 U/L — ABNORMAL HIGH (ref 98–192)
LDH: 1531 U/L — ABNORMAL HIGH (ref 98–192)

## 2019-05-18 LAB — COOXEMETRY PANEL
Carboxyhemoglobin: 2.5 % — ABNORMAL HIGH (ref 0.5–1.5)
Carboxyhemoglobin: 2.5 % — ABNORMAL HIGH (ref 0.5–1.5)
Methemoglobin: 1.8 % — ABNORMAL HIGH (ref 0.0–1.5)
Methemoglobin: 2 % — ABNORMAL HIGH (ref 0.0–1.5)
O2 Saturation: 67.7 %
O2 Saturation: 70.2 %
Total hemoglobin: 13 g/dL (ref 12.0–16.0)
Total hemoglobin: 11.4 g/dL — ABNORMAL LOW (ref 12.0–16.0)

## 2019-05-18 LAB — URINALYSIS, ROUTINE W REFLEX MICROSCOPIC
Bilirubin Urine: NEGATIVE
Glucose, UA: NEGATIVE mg/dL
Ketones, ur: NEGATIVE mg/dL
Leukocytes,Ua: NEGATIVE
Nitrite: NEGATIVE
Protein, ur: 100 mg/dL — AB
Specific Gravity, Urine: 1.01 (ref 1.005–1.030)
pH: 6 (ref 5.0–8.0)

## 2019-05-18 LAB — COMPREHENSIVE METABOLIC PANEL
ALT: 25 U/L (ref 0–44)
ALT: 30 U/L (ref 0–44)
ALT: 24 U/L (ref 0–44)
AST: 128 U/L — ABNORMAL HIGH (ref 15–41)
AST: 130 U/L — ABNORMAL HIGH (ref 15–41)
AST: 118 U/L — ABNORMAL HIGH (ref 15–41)
Albumin: 2.7 g/dL — ABNORMAL LOW (ref 3.5–5.0)
Albumin: 2.7 g/dL — ABNORMAL LOW (ref 3.5–5.0)
Albumin: 2.5 g/dL — ABNORMAL LOW (ref 3.5–5.0)
Alkaline Phosphatase: 63 U/L (ref 38–126)
Alkaline Phosphatase: 63 U/L (ref 38–126)
Alkaline Phosphatase: 61 U/L (ref 38–126)
Anion gap: 12 (ref 5–15)
Anion gap: 14 (ref 5–15)
Anion gap: 14 (ref 5–15)
BUN: 43 mg/dL — ABNORMAL HIGH (ref 8–23)
BUN: 48 mg/dL — ABNORMAL HIGH (ref 8–23)
BUN: 51 mg/dL — ABNORMAL HIGH (ref 8–23)
CO2: 27 mmol/L (ref 22–32)
CO2: 27 mmol/L (ref 22–32)
CO2: 25 mmol/L (ref 22–32)
Calcium: 8.4 mg/dL — ABNORMAL LOW (ref 8.9–10.3)
Calcium: 8.4 mg/dL — ABNORMAL LOW (ref 8.9–10.3)
Calcium: 8.4 mg/dL — ABNORMAL LOW (ref 8.9–10.3)
Chloride: 98 mmol/L (ref 98–111)
Chloride: 97 mmol/L — ABNORMAL LOW (ref 98–111)
Chloride: 98 mmol/L (ref 98–111)
Creatinine, Ser: 2.79 mg/dL — ABNORMAL HIGH (ref 0.61–1.24)
Creatinine, Ser: 3.05 mg/dL — ABNORMAL HIGH (ref 0.61–1.24)
Creatinine, Ser: 3.43 mg/dL — ABNORMAL HIGH (ref 0.61–1.24)
GFR calc Af Amer: 26 mL/min — ABNORMAL LOW (ref >60)
GFR calc Af Amer: 23 mL/min — ABNORMAL LOW (ref >60)
GFR calc Af Amer: 20 mL/min — ABNORMAL LOW (ref >60)
GFR calc non Af Amer: 22 mL/min — ABNORMAL LOW (ref >60)
GFR calc non Af Amer: 20 mL/min — ABNORMAL LOW (ref >60)
GFR calc non Af Amer: 17 mL/min — ABNORMAL LOW (ref >60)
Glucose, Bld: 153 mg/dL — ABNORMAL HIGH (ref 70–99)
Glucose, Bld: 147 mg/dL — ABNORMAL HIGH (ref 70–99)
Glucose, Bld: 148 mg/dL — ABNORMAL HIGH (ref 70–99)
Potassium: 4.3 mmol/L (ref 3.5–5.1)
Potassium: 3.6 mmol/L (ref 3.5–5.1)
Potassium: 3.4 mmol/L — ABNORMAL LOW (ref 3.5–5.1)
Sodium: 137 mmol/L (ref 135–145)
Sodium: 138 mmol/L (ref 135–145)
Sodium: 137 mmol/L (ref 135–145)
Total Bilirubin: 3.3 mg/dL — ABNORMAL HIGH (ref 0.3–1.2)
Total Bilirubin: 2.7 mg/dL — ABNORMAL HIGH (ref 0.3–1.2)
Total Bilirubin: 1.8 mg/dL — ABNORMAL HIGH (ref 0.3–1.2)
Total Protein: 5.5 g/dL — ABNORMAL LOW (ref 6.5–8.1)
Total Protein: 5.7 g/dL — ABNORMAL LOW (ref 6.5–8.1)
Total Protein: 5.3 g/dL — ABNORMAL LOW (ref 6.5–8.1)

## 2019-05-18 LAB — POCT ACTIVATED CLOTTING TIME
Activated Clotting Time: 136 seconds
Activated Clotting Time: 136 seconds
Activated Clotting Time: 142 seconds
Activated Clotting Time: 147 seconds
Activated Clotting Time: 147 seconds
Activated Clotting Time: 147 seconds
Activated Clotting Time: 153 seconds
Activated Clotting Time: 158 seconds
Activated Clotting Time: 164 seconds
Activated Clotting Time: 164 seconds
Activated Clotting Time: 164 seconds
Activated Clotting Time: 169 seconds
Activated Clotting Time: 169 seconds
Activated Clotting Time: 169 seconds

## 2019-05-18 LAB — POCT I-STAT, CHEM 8
BUN: 50 mg/dL — ABNORMAL HIGH (ref 8–23)
BUN: 46 mg/dL — ABNORMAL HIGH (ref 8–23)
Calcium, Ion: 1.15 mmol/L (ref 1.15–1.40)
Calcium, Ion: 1.14 mmol/L — ABNORMAL LOW (ref 1.15–1.40)
Chloride: 96 mmol/L — ABNORMAL LOW (ref 98–111)
Chloride: 97 mmol/L — ABNORMAL LOW (ref 98–111)
Creatinine, Ser: 3 mg/dL — ABNORMAL HIGH (ref 0.61–1.24)
Creatinine, Ser: 3.2 mg/dL — ABNORMAL HIGH (ref 0.61–1.24)
Glucose, Bld: 131 mg/dL — ABNORMAL HIGH (ref 70–99)
Glucose, Bld: 135 mg/dL — ABNORMAL HIGH (ref 70–99)
HCT: 39 % (ref 39.0–52.0)
HCT: 35 % — ABNORMAL LOW (ref 39.0–52.0)
Hemoglobin: 13.3 g/dL (ref 13.0–17.0)
Hemoglobin: 11.9 g/dL — ABNORMAL LOW (ref 13.0–17.0)
Potassium: 3.4 mmol/L — ABNORMAL LOW (ref 3.5–5.1)
Potassium: 3.5 mmol/L (ref 3.5–5.1)
Sodium: 139 mmol/L (ref 135–145)
Sodium: 138 mmol/L (ref 135–145)
TCO2: 31 mmol/L (ref 22–32)
TCO2: 28 mmol/L (ref 22–32)

## 2019-05-18 LAB — HEPARIN LEVEL (UNFRACTIONATED): Heparin Unfractionated: 0.41 IU/mL (ref 0.30–0.70)

## 2019-05-18 LAB — GLUCOSE, CAPILLARY
Glucose-Capillary: 112 mg/dL — ABNORMAL HIGH (ref 70–99)
Glucose-Capillary: 157 mg/dL — ABNORMAL HIGH (ref 70–99)
Glucose-Capillary: 168 mg/dL — ABNORMAL HIGH (ref 70–99)
Glucose-Capillary: 171 mg/dL — ABNORMAL HIGH (ref 70–99)
Glucose-Capillary: 157 mg/dL — ABNORMAL HIGH (ref 70–99)
Glucose-Capillary: 139 mg/dL — ABNORMAL HIGH (ref 70–99)
Glucose-Capillary: 157 mg/dL — ABNORMAL HIGH (ref 70–99)
Glucose-Capillary: 140 mg/dL — ABNORMAL HIGH (ref 70–99)
Glucose-Capillary: 166 mg/dL — ABNORMAL HIGH (ref 70–99)

## 2019-05-18 LAB — PHOSPHORUS
Phosphorus: 4 mg/dL (ref 2.5–4.6)
Phosphorus: 3.8 mg/dL (ref 2.5–4.6)

## 2019-05-18 LAB — PROTIME-INR
INR: 1.5 — ABNORMAL HIGH (ref 0.8–1.2)
Prothrombin Time: 17.7 seconds — ABNORMAL HIGH (ref 11.4–15.2)

## 2019-05-18 LAB — APTT: aPTT: 111 seconds — ABNORMAL HIGH (ref 24–36)

## 2019-05-18 LAB — ABO/RH: ABO/RH(D): O POS

## 2019-05-18 LAB — MAGNESIUM
Magnesium: 2.5 mg/dL — ABNORMAL HIGH (ref 1.7–2.4)
Magnesium: 2.3 mg/dL (ref 1.7–2.4)

## 2019-05-18 LAB — TROPONIN I (HIGH SENSITIVITY): Troponin I (High Sensitivity): 16606 ng/L (ref <18)

## 2019-05-18 LAB — PROCALCITONIN: Procalcitonin: 6.85 ng/mL

## 2019-05-18 LAB — LACTIC ACID, PLASMA
Lactic Acid, Venous: 2.1 mmol/L (ref 0.5–1.9)
Lactic Acid, Venous: 1.4 mmol/L (ref 0.5–1.9)

## 2019-05-18 SURGERY — INSERTION OF IMPLANTABLE LEFT VENTRICULAR ASSIST DEVICE
Anesthesia: General | Site: Chest

## 2019-05-18 MED ORDER — NOREPINEPHRINE 4 MG/250ML-% IV SOLN
0.0000 ug/min | INTRAVENOUS | Status: AC
  Administered 2019-05-18: 10 ug/min via INTRAVENOUS
  Filled 2019-05-18: qty 250

## 2019-05-18 MED ORDER — VASOPRESSIN 20 UNIT/ML IV SOLN
0.0400 [IU]/min | INTRAVENOUS | Status: DC
  Filled 2019-05-18 (×2): qty 2

## 2019-05-18 MED ORDER — SODIUM CHLORIDE 0.9 % IV SOLN
1.5000 g | INTRAVENOUS | Status: DC
  Filled 2019-05-18: qty 1.5

## 2019-05-18 MED ORDER — TRANEXAMIC ACID (OHS) PUMP PRIME SOLUTION
2.0000 mg/kg | INTRAVENOUS | Status: DC
  Filled 2019-05-18: qty 1.42

## 2019-05-18 MED ORDER — SODIUM CHLORIDE 0.9 % IV SOLN
INTRAVENOUS | Status: DC
  Filled 2019-05-18: qty 30

## 2019-05-18 MED ORDER — VANCOMYCIN HCL 1 G IV SOLR
1000.0000 mg | INTRAVENOUS | Status: DC
  Filled 2019-05-18: qty 1000

## 2019-05-18 MED ORDER — HEMOSTATIC AGENTS (NO CHARGE) OPTIME
TOPICAL | Status: DC | PRN
  Administered 2019-05-18: 1 via TOPICAL

## 2019-05-18 MED ORDER — INSULIN REGULAR(HUMAN) IN NACL 100-0.9 UT/100ML-% IV SOLN
INTRAVENOUS | Status: DC
  Filled 2019-05-18: qty 100

## 2019-05-18 MED ORDER — MIDAZOLAM HCL (PF) 10 MG/2ML IJ SOLN
INTRAMUSCULAR | Status: AC
  Filled 2019-05-18: qty 2

## 2019-05-18 MED ORDER — NITROGLYCERIN IN D5W 200-5 MCG/ML-% IV SOLN
0.0000 ug/min | INTRAVENOUS | Status: DC
  Filled 2019-05-18: qty 250

## 2019-05-18 MED ORDER — ROCURONIUM BROMIDE 10 MG/ML (PF) SYRINGE
PREFILLED_SYRINGE | INTRAVENOUS | Status: AC
  Filled 2019-05-18: qty 10

## 2019-05-18 MED ORDER — VANCOMYCIN HCL 1000 MG IV SOLR
INTRAVENOUS | Status: DC | PRN
  Administered 2019-05-18: 1000 mg

## 2019-05-18 MED ORDER — VANCOMYCIN HCL 10 G IV SOLR
1250.0000 mg | INTRAVENOUS | Status: DC
  Filled 2019-05-18: qty 1250

## 2019-05-18 MED ORDER — VANCOMYCIN VARIABLE DOSE PER UNSTABLE RENAL FUNCTION (PHARMACIST DOSING): Status: DC

## 2019-05-18 MED ORDER — VASOPRESSIN 20 UNIT/ML IV SOLN
INTRAVENOUS | Status: AC
  Filled 2019-05-18: qty 1

## 2019-05-18 MED ORDER — TRANEXAMIC ACID 1000 MG/10ML IV SOLN
1.5000 mg/kg/h | INTRAVENOUS | Status: DC
  Filled 2019-05-18: qty 25

## 2019-05-18 MED ORDER — DEXMEDETOMIDINE HCL IN NACL 400 MCG/100ML IV SOLN
0.1000 ug/kg/h | INTRAVENOUS | Status: DC
  Filled 2019-05-18: qty 100

## 2019-05-18 MED ORDER — SODIUM CHLORIDE 0.9 % IV SOLN
INTRAVENOUS | Status: DC | PRN
  Administered 2019-05-18: 500 mL

## 2019-05-18 MED ORDER — ATORVASTATIN CALCIUM 80 MG PO TABS
80.0000 mg | ORAL_TABLET | Freq: Every day | ORAL | Status: DC
  Administered 2019-05-19 – 2019-06-02 (×12): 80 mg
  Filled 2019-05-18 (×11): qty 1

## 2019-05-18 MED ORDER — MILRINONE LACTATE IN DEXTROSE 20-5 MG/100ML-% IV SOLN
0.3000 ug/kg/min | INTRAVENOUS | Status: AC
  Administered 2019-05-18: 0.25 ug/kg/min via INTRAVENOUS
  Filled 2019-05-18: qty 100

## 2019-05-18 MED ORDER — CEFAZOLIN SODIUM-DEXTROSE 2-3 GM-%(50ML) IV SOLR
INTRAVENOUS | Status: DC | PRN
  Administered 2019-05-18: 2 g via INTRAVENOUS

## 2019-05-18 MED ORDER — POTASSIUM CHLORIDE 20 MEQ/15ML (10%) PO SOLN: 20.0000 meq | Freq: Once | ORAL | Status: DC

## 2019-05-18 MED ORDER — SODIUM CHLORIDE 0.9 % IV SOLN
600.0000 mg | INTRAVENOUS | Status: DC
  Filled 2019-05-18: qty 600

## 2019-05-18 MED ORDER — POTASSIUM CHLORIDE 20 MEQ/15ML (10%) PO SOLN
20.0000 meq | Freq: Every day | ORAL | Status: DC
  Administered 2019-05-18: 20 meq via ORAL
  Filled 2019-05-18: qty 15

## 2019-05-18 MED ORDER — POTASSIUM CHLORIDE 2 MEQ/ML IV SOLN
80.0000 meq | INTRAVENOUS | Status: DC
  Filled 2019-05-18: qty 40

## 2019-05-18 MED ORDER — SODIUM CHLORIDE 0.9 % IV SOLN
750.0000 mg | INTRAVENOUS | Status: DC
  Filled 2019-05-18: qty 750

## 2019-05-18 MED ORDER — ONDANSETRON HCL 4 MG/2ML IJ SOLN
INTRAMUSCULAR | Status: AC
  Filled 2019-05-18: qty 2

## 2019-05-18 MED ORDER — FLUCONAZOLE IN SODIUM CHLORIDE 400-0.9 MG/200ML-% IV SOLN
400.0000 mg | INTRAVENOUS | Status: DC
  Filled 2019-05-18: qty 200

## 2019-05-18 MED ORDER — HEPARIN SODIUM (PORCINE) 1000 UNIT/ML IJ SOLN
INTRAMUSCULAR | Status: DC | PRN
  Administered 2019-05-18: 5000 [IU] via INTRAVENOUS

## 2019-05-18 MED ORDER — PHENYLEPHRINE HCL-NACL 20-0.9 MG/250ML-% IV SOLN
0.0000 ug/min | INTRAVENOUS | Status: DC
  Filled 2019-05-18: qty 250

## 2019-05-18 MED ORDER — SODIUM CHLORIDE 0.9 % IV SOLN
INTRAVENOUS | Status: AC
  Filled 2019-05-18: qty 1.2

## 2019-05-18 MED ORDER — PROPOFOL 10 MG/ML IV BOLUS
INTRAVENOUS | Status: AC
  Filled 2019-05-18: qty 20

## 2019-05-18 MED ORDER — HEPARIN SODIUM (PORCINE) 1000 UNIT/ML IJ SOLN
INTRAMUSCULAR | Status: AC
  Filled 2019-05-18: qty 2

## 2019-05-18 MED ORDER — DOBUTAMINE IN D5W 4-5 MG/ML-% IV SOLN
2.0000 ug/kg/min | INTRAVENOUS | Status: DC
  Filled 2019-05-18: qty 250

## 2019-05-18 MED ORDER — CEFAZOLIN SODIUM 1 G IJ SOLR
INTRAMUSCULAR | Status: AC
  Filled 2019-05-18: qty 20

## 2019-05-18 MED ORDER — EPINEPHRINE HCL 5 MG/250ML IV SOLN IN NS
0.0000 ug/min | INTRAVENOUS | Status: DC
  Filled 2019-05-18: qty 250

## 2019-05-18 MED ORDER — SODIUM CHLORIDE 0.9 % IV BOLUS
250.0000 mL | Freq: Once | INTRAVENOUS | Status: AC
  Administered 2019-05-18: 23:00:00 250 mL via INTRAVENOUS

## 2019-05-18 MED ORDER — TRANEXAMIC ACID (OHS) BOLUS VIA INFUSION
15.0000 mg/kg | INTRAVENOUS | Status: DC
  Filled 2019-05-18: qty 1068

## 2019-05-18 MED ORDER — FENTANYL CITRATE (PF) 100 MCG/2ML IJ SOLN
INTRAMUSCULAR | Status: DC | PRN
  Administered 2019-05-18 (×2): 50 ug via INTRAVENOUS

## 2019-05-18 MED ORDER — FENTANYL CITRATE (PF) 250 MCG/5ML IJ SOLN
INTRAMUSCULAR | Status: AC
  Filled 2019-05-18: qty 10

## 2019-05-18 MED ORDER — SODIUM CHLORIDE 0.9 % IR SOLN
Status: DC | PRN
  Administered 2019-05-18: 2000 mL

## 2019-05-18 MED ORDER — ARTIFICIAL TEARS OPHTHALMIC OINT
TOPICAL_OINTMENT | OPHTHALMIC | Status: AC
  Filled 2019-05-18: qty 3.5

## 2019-05-18 MED ORDER — MAGNESIUM SULFATE 50 % IJ SOLN
40.0000 meq | INTRAMUSCULAR | Status: DC
  Filled 2019-05-18: qty 9.85

## 2019-05-18 MED ORDER — DOPAMINE-DEXTROSE 3.2-5 MG/ML-% IV SOLN
0.0000 ug/kg/min | INTRAVENOUS | Status: DC
  Filled 2019-05-18: qty 250

## 2019-05-18 MED ORDER — ARTIFICIAL TEARS OPHTHALMIC OINT
TOPICAL_OINTMENT | OPHTHALMIC | Status: DC | PRN
  Administered 2019-05-18: 1 via OPHTHALMIC

## 2019-05-18 MED ORDER — ROCURONIUM BROMIDE 50 MG/5ML IV SOSY
PREFILLED_SYRINGE | INTRAVENOUS | Status: DC | PRN
  Administered 2019-05-18 (×5): 10 mg via INTRAVENOUS
  Administered 2019-05-18: 40 mg via INTRAVENOUS

## 2019-05-18 SURGICAL SUPPLY — 66 items
ADAPTER CARDIO PERF ANTE/RETRO (ADAPTER) IMPLANT
ADPR PRFSN 84XANTGRD RTRGD (ADAPTER)
ANTEGRADE CPLG (MISCELLANEOUS) IMPLANT
APL PRP STRL LF DISP 70% ISPRP (MISCELLANEOUS) ×2
BAG DECANTER FOR FLEXI CONT (MISCELLANEOUS) ×3 IMPLANT
BLADE SURG 15 STRL LF DISP TIS (BLADE) IMPLANT
BLADE SURG 15 STRL SS (BLADE)
CANISTER SUCT 3000ML PPV (MISCELLANEOUS) ×3 IMPLANT
CATH DIAG EXPO 6F VENT PIG 145 (CATHETERS) ×3 IMPLANT
CATH EXPO 5FR AL1 (CATHETERS) ×3 IMPLANT
CATH ROBINSON RED A/P 18FR (CATHETERS) ×3 IMPLANT
CATH SOFT-VU ST 4F 90CM (CATHETERS) ×3 IMPLANT
CATH THORACIC 28FR (CATHETERS) IMPLANT
CHLORAPREP W/TINT 26 (MISCELLANEOUS) ×3 IMPLANT
CONN ST 1/4X3/8  BEN (MISCELLANEOUS)
CONN ST 1/4X3/8 BEN (MISCELLANEOUS) IMPLANT
DRAIN CHANNEL 28F RND 3/8 FF (WOUND CARE) IMPLANT
DRAPE CV SPLIT W-CLR ANES SCRN (DRAPES) ×3 IMPLANT
DRAPE INCISE IOBAN 66X45 STRL (DRAPES) ×3 IMPLANT
DRAPE SLUSH/WARMER DISC (DRAPES) ×3 IMPLANT
DRSG TEGADERM 4X4.75 (GAUZE/BANDAGES/DRESSINGS) ×3 IMPLANT
ELECT BLADE 4.0 EZ CLEAN MEGAD (MISCELLANEOUS) ×3
ELECT REM PT RETURN 9FT ADLT (ELECTROSURGICAL) ×3
ELECTRODE BLDE 4.0 EZ CLN MEGD (MISCELLANEOUS) ×2 IMPLANT
ELECTRODE REM PT RTRN 9FT ADLT (ELECTROSURGICAL) ×2 IMPLANT
FELT TEFLON 1X6 (MISCELLANEOUS) ×3 IMPLANT
GAUZE SPONGE 4X4 12PLY STRL (GAUZE/BANDAGES/DRESSINGS) ×3 IMPLANT
GLOVE BIO SURGEON STRL SZ 6 (GLOVE) ×6 IMPLANT
GLOVE BIOGEL PI IND STRL 6 (GLOVE) ×4 IMPLANT
GLOVE BIOGEL PI INDICATOR 6 (GLOVE) ×2
GLOVE NEODERM STRL 7.5 LF PF (GLOVE) ×4 IMPLANT
GLOVE SURG NEODERM 7.5  LF PF (GLOVE) ×2
GOWN STRL REUS W/ TWL LRG LVL3 (GOWN DISPOSABLE) ×12 IMPLANT
GOWN STRL REUS W/TWL LRG LVL3 (GOWN DISPOSABLE) ×18
GRAFT HEMASHIELD 10MM (Graft) ×3 IMPLANT
GRAFT VASC STRG 30X10STRL (Graft) ×2 IMPLANT
GUIDEWIRE SAF TJ AMPL .035X180 (WIRE) ×3 IMPLANT
HEMOSTAT SURGICEL 2X14 (HEMOSTASIS) IMPLANT
INSERT FOGARTY SM (MISCELLANEOUS) ×6 IMPLANT
INSERT FOGARTY XLG (MISCELLANEOUS) IMPLANT
KIT BASIN OR (CUSTOM PROCEDURE TRAY) ×3 IMPLANT
KIT TURNOVER KIT B (KITS) ×3 IMPLANT
LEAD PACING MYOCARDI (MISCELLANEOUS) IMPLANT
LOOP VESSEL MINI RED (MISCELLANEOUS) ×3 IMPLANT
NS IRRIG 1000ML POUR BTL (IV SOLUTION) ×6 IMPLANT
PACK OPEN HEART (CUSTOM PROCEDURE TRAY) ×3 IMPLANT
PAD ABD 8X10 STRL (GAUZE/BANDAGES/DRESSINGS) ×6 IMPLANT
POWDER SURGICEL 3.0 GRAM (HEMOSTASIS) ×3 IMPLANT
PUMP SET IMPELLA 5.0 AIC (CATHETERS) ×3 IMPLANT
SEALANT SURG COSEAL 8ML (VASCULAR PRODUCTS) ×3 IMPLANT
SHEATH AVANTI 11CM 5FR (SHEATH) IMPLANT
SUT MNCRL AB 3-0 PS2 18 (SUTURE) ×6 IMPLANT
SUT PROLENE 3 0 RB 1 (SUTURE) IMPLANT
SUT PROLENE 4 0 RB 1 (SUTURE) ×4
SUT PROLENE 4 0 SH DA (SUTURE) ×6 IMPLANT
SUT PROLENE 4-0 RB1 .5 CRCL 36 (SUTURE) ×4 IMPLANT
SUT PROLENE 5 0 C 1 36 (SUTURE) ×6 IMPLANT
SUT PROLENE 5 0 C1 (SUTURE) ×6 IMPLANT
SUT SILK 1 TIES 10X30 (SUTURE) ×3 IMPLANT
SUT SILK 2 0 SH CR/8 (SUTURE) ×3 IMPLANT
SUT VIC AB 2-0 CT1 27 (SUTURE) ×6
SUT VIC AB 2-0 CT1 TAPERPNT 27 (SUTURE) ×4 IMPLANT
TOWEL GREEN STERILE (TOWEL DISPOSABLE) ×3 IMPLANT
WATER STERILE IRR 1000ML POUR (IV SOLUTION) ×3 IMPLANT
WIRE AMPLATZ SS-J .035X180CM (WIRE) ×3 IMPLANT
WIRE EMERALD 3MM-J .035X260CM (WIRE) ×6 IMPLANT

## 2019-05-18 NOTE — Progress Notes (Signed)
Advanced Heart Failure Rounding Note   Subjective:    Intubated/sedated. Impella CP and Swan in place.  On milrinone 0.375 and NE at 3. Bicarb gtt. Good urine output with 160 IV lasix last night.   Urine was tea-colored now more cherry colored LDH 1,448  Creatinine 2.49 -> 2.79  PCT 6.85  Swan #s (done personally) RA 4 PA 53/15 (27) PCWP 8 Thermo 3.6/2.0 Co-ox 68%  Impella P-8 3.6L flow Waveforms ok  Positioning checked on echo and Impella in at 5cm  Pulled back ~1.2 cm   D/w Dr. [name] at bedside   Objective:   Weight Range:  Vital Signs:   Temp:  [98.4 F (36.9 C)-100.2 F (37.9 C)] 99.5 F (37.5 C) (10/24 0900) Pulse Rate:  [88-104] 92 (10/23 1227) Resp:  [0-30] 14 (10/24 0900) BP: (77-124)/(57-99) 92/72 (10/24 0900) SpO2:  [93 %-99 %] 96 % (10/24 0900) Arterial Line BP: (66-113)/(44-70) 99/63 (10/24 0900) FiO2 (%):  [40 %] 40 % (10/24 0842) Weight:  [71.2 kg] 71.2 kg (10/24 0658) Last BM Date: (PTA)  Weight change: Filed Weights   05/04/2019 2030 05/23/2019 0545 05/04/2019 0658  Weight: 72.6 kg 70.5 kg 71.2 kg    Intake/Output:   Intake/Output Summary (Last 24 hours) at 04/26/2019 0959 Last data filed at 05/11/2019 0900 Gross per 24 hour  Intake 4061.3 ml  Output 4255 ml  Net -193.7 ml     Physical Exam: General:  Intubated/sedated HEENT: normal + ETT Neck: supple. JVP flat. Carotids 2+ bilat; no bruits. No lymphadenopathy or thryomegaly appreciated. Cor: PMI nondisplaced. Regular rate & rhythm. No rubs, gallops or murmurs. Lungs: clear Abdomen: soft, nontender, nondistended. No hepatosplenomegaly. No bruits or masses. Good bowel sounds. Extremities: no cyanosis, clubbing, rash, edema  R groin impella and swan. Site ok  R foot cool. PT pulse dopplerable Neuro: sedated  Telemetry: Sinus 90s with frequent PVCs Personally reviewed   Labs: Basic Metabolic Panel: Recent Labs  Lab 05/13/2019 0424  05/08/2019 0900  04/26/2019 1234 04/26/2019 1504  05/12/2019 1600 05/05/2019 1636 05/21/2019 1913 04/29/2019 2202 05/04/2019 0350  NA 134*   < > 138   < > 133*  --  134* 136 139 140 137  K 5.1   < > 5.7*   < > 5.5*  --  5.4* 5.3* 4.0 3.7 4.3  CL 107  --  109  --  105  --  105  --  101  --  98  CO2 16*  --  16*  --  17*  --  17*  --  23  --  27  GLUCOSE 144*  --  143*  --  128*  --  162*  --  162*  --  153*  BUN 22  --  26*  --  30*  --  34*  --  37*  --  43*  CREATININE 2.08*  --  2.31*  --  2.23*  --  2.48*  --  2.49*  --  2.79*  CALCIUM 8.5*  --  8.2*  --  7.8*  --  7.9*  --  9.2  --  8.4*  MG 2.1  --   --   --   --  2.0 2.0  --   --   --  2.5*  PHOS 4.3  --   --   --   --  5.5* 5.8*  --   --   --  4.0   < > = values in  this interval not displayed.    Liver Function Tests: Recent Labs  Lab 05/01/2019 1834 05/04/2019 0424 05/09/2019 0350  AST 33 70* 128*  ALT 23 40 25  ALKPHOS 108 86 63  BILITOT 1.4* 1.8* 3.3*  PROT 7.8 6.7 5.5*  ALBUMIN 3.6 3.2* 2.7*   No results for input(s): LIPASE, AMYLASE in the last 168 hours. No results for input(s): AMMONIA in the last 168 hours.  CBC: Recent Labs  Lab 05/10/2019 1834  04/28/2019 0424  05/10/2019 0900  05/02/2019 1016 05/10/2019 1504 04/27/2019 1636 05/01/2019 2202 04/26/2019 0350  WBC 19.6*  --  22.2*  --  20.8*  --   --  15.9*  --   --  19.0*  NEUTROABS 17.1*  --   --   --  17.8*  --   --  13.3*  --   --   --   HGB 17.2*   < > 16.0   < > 16.3   < > 17.0 14.7 14.6 13.3 13.2  HCT 53.3*   < > 49.2   < > 51.1   < > 50.0 45.8 43.0 39.0 38.6*  MCV 94.3  --  92.5  --  93.1  --   --  94.2  --   --  88.9  PLT 213  --  193  --  204  --   --  149*  --   --  139*   < > = values in this interval not displayed.    Cardiac Enzymes: No results for input(s): CKTOTAL, CKMB, CKMBINDEX, TROPONINI in the last 168 hours.  BNP: BNP (last 3 results) Recent Labs    05/19/2019 1739 05/06/2019 1234  BNP 2,725.3* 3,370.0*    ProBNP (last 3 results) No results for input(s): PROBNP in the last 8760 hours.    Other  results:  Imaging: Ct Angio Chest Pe W And/or Wo Contrast  Result Date: 05/08/2019 CLINICAL DATA:  Shortness of breath. EXAM: CT ANGIOGRAPHY CHEST WITH CONTRAST TECHNIQUE: Multidetector CT imaging of the chest was performed using the standard protocol during bolus administration of intravenous contrast. Multiplanar CT image reconstructions and MIPs were obtained to evaluate the vascular anatomy. CONTRAST:  53m OMNIPAQUE IOHEXOL 350 MG/ML SOLN COMPARISON:  12/05/2017 FINDINGS: Cardiovascular: The main pulmonary artery is patent. There is no central obstructing or saddle embolus identified. No lobar pulmonary artery filling defects for segmental pulmonary artery filling defects identified. Normal heart size. No pericardial effusion. Aortic atherosclerosis. 3 vessel coronary artery calcifications. Mediastinum/Nodes: The endotracheal tube tip is above the carina. There is a nasogastric tube with tip in the stomach. No supraclavicular, axillary or mediastinal adenopathy. Diffuse increased soft tissue within bilateral hilar regions identified, new from previous exam. For example, within the right hilar region there is increased soft tissue measuring 3.7 x 2.9 cm. Within the left suprahilar region there is increased soft tissue measuring 3.1 x 2.0 cm. Lungs/Pleura: There is dense bilateral lower lobe posterior airspace consolidation. There is also bilateral upper lobe posterior subpleural consolidation. Diffuse interlobular septal thickening with areas of ground-glass attenuation identified compatible with pulmonary edema. Upper Abdomen: No acute abnormality. Musculoskeletal: No chest wall abnormality. No acute or significant osseous findings. Review of the MIP images confirms the above findings. IMPRESSION: 1. No evidence for acute pulmonary embolus. 2. Bilateral lower lobe posterior subpleural consolidation is identified compatible with multifocal pneumonia. 3. Diffuse interlobular septal thickening, ground-glass  attenuation and areas of ground-glass attenuation compatible with pulmonary edema which may be secondary to  heart failure 4. Interval development of bilateral hilar adenopathy which may be reactive in etiology. Differential considerations include granulomatous inflammation/infection, lymphoproliferative disorder or metastatic adenopathy. This is may also be nonspecific in the setting of CHF. Advise follow-up imaging in 3 months with repeat CT of the chest with contrast. This recommendation follows ACR consensus guidelines: Managing Incidental Findings on Thoracic CT: Mediastinal and Cardiovascular Findings. A White Paper of the ACR Incidental Findings Committee. J Am Coll Radiol. 2018; 15: 2482-5003. 5. 3 vessel coronary artery calcifications noted. Aortic Atherosclerosis (ICD10-I70.0). Electronically Signed   By: Kerby Moors M.D.   On: 05/02/2019 20:18   Dg Chest Port 1 View  Result Date: 05/09/2019 CLINICAL DATA:  Respiratory failure EXAM: PORTABLE CHEST 1 VIEW COMPARISON:  05/11/2019 FINDINGS: Endotracheal tube, gastric catheter and right-sided PICC line are noted in satisfactory position. Cardiac shadow is mildly enlarged but stable. Aortic calcifications are again seen. Vascular congestion is noted patchy parenchymal opacities bilaterally likely related to edema. Left retrocardiac density remains. No bony abnormality is seen. IMPRESSION: Vascular congestion and patchy opacities likely related to edema. Left retrocardiac atelectasis. Electronically Signed   By: Inez Catalina M.D.   On: 05/01/2019 07:37   Dg Chest Portable 1 View  Result Date: 05/04/2019 CLINICAL DATA:  Intubation. Image 2 after pulled back. EXAM: PORTABLE CHEST 1 VIEW COMPARISON:  05/22/2019 FINDINGS: Endotracheal tube has been placed. On the initial image the tube is 3.6 centimeters above the carina. On the second image, tube is approximately 5.4 centimeters above the carina. Nasogastric tube is identified, tip beyond the level of  the proximal stomach. Heart size is normal. There are hazy infiltrates throughout the lungs bilaterally, slightly increased since the prior study. Opacity at the LEFT lung base is more confluent. IMPRESSION: 1. Interval placement of endotracheal tube. 2. Increased bilateral infiltrates. 3. More confluent opacity at the LEFT lung base. Electronically Signed   By: Nolon Nations M.D.   On: 05/08/2019 18:57   Dg Chest Port 1 View  Result Date: 05/23/2019 CLINICAL DATA:  Chest pain. EXAM: PORTABLE CHEST 1 VIEW COMPARISON:  September 18, 2018. FINDINGS: The heart size and mediastinal contours are within normal limits. No pneumothorax or pleural effusion is noted. Multiple small ill-defined interstitial densities are noted throughout both lungs which may represent focal inflammation. The visualized skeletal structures are unremarkable. IMPRESSION: Multiple small ill-defined interstitial densities are noted throughout both lungs which potentially may represent multifocal inflammation or possibly edema. Electronically Signed   By: Marijo Conception M.D.   On: 05/24/2019 16:15   [place] Ekg Site Rite  Result Date: 05/10/2019 If Site Rite image not attached, placement could not be confirmed due to current cardiac rhythm.     Medications:     Scheduled Medications: . aspirin  81 mg Per Tube Daily  . atorvastatin  80 mg Per Tube q1800  . chlorhexidine gluconate (MEDLINE KIT)  15 mL Mouth Rinse BID  . Chlorhexidine Gluconate Cloth  6 each Topical Daily  . gabapentin  100 mg Per Tube Q8H  . insulin aspart  1-3 Units Subcutaneous Q4H  . mouth rinse  15 mL Mouth Rinse 10 times per day  . pantoprazole (PROTONIX) IV  40 mg Intravenous QHS  . sodium chloride flush  10-40 mL Intracatheter Q12H  . sodium chloride flush  3 mL Intravenous Q12H  . vancomycin variable dose per unstable renal function (pharmacist dosing)   Does not apply See admin instructions     Infusions: . sodium chloride  10 mL/hr at 05/09/2019  0900  . sodium chloride 10 mL/hr at 05/05/2019 0900  . sodium chloride    . calcium gluconate    . ceFEPime (MAXIPIME) IV 2 g (05/22/2019 0942)  . feeding supplement (VITAL AF 1.2 CAL) 1,000 mL (05/09/2019 1600)  . fentaNYL infusion INTRAVENOUS 100 mcg/hr (05/14/2019 0900)  . impella catheter heparin 50 unit/mL in dextrose 5%    . heparin 1,300 Units/hr (05/01/2019 0900)  . midazolam 1 mg/hr (05/13/2019 0900)  . milrinone 0.25 mcg/kg/min (05/02/2019 0900)  . norepinephrine (LEVOPHED) Adult infusion 3 mcg/min (05/23/2019 0900)     PRN Medications:  sodium chloride, Place/Maintain arterial line **AND** sodium chloride, docusate, fentaNYL, fentaNYL (SUBLIMAZE) injection, fentaNYL (SUBLIMAZE) injection, midazolam, midazolam, sodium chloride flush   Assessment/Plan:   1. 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 LM 75%, LAD 100%, LCX 80%, RCA 80% prox - Echo EF 20% RV moderately HK - Impella CP placed 10/23. On P-8 Flow 3.6. Waveforms ok  - On milrinone 0.375 NE 3. Co-ox 69%. Swan numbers improved - Lactate down to 2.1 - Continue current support. Can stop bicarb - Elevated LDH and dark urine concerning for hemolysis. (LDH may also be elevated from MI) Impella pulled back under u/s guidance this am.   - Repeat LDH and lactate at noon. Check UA - R foot cool but PT dopplerable - D/w Dr. [name] at the bedside. CABG likely best option (vs VAD) if he can be stabilized and renal function improves. Can switch Impella to 5.0 as needed   2. NSTEMI/CAD - hstrop 16k - Cath 10/23 with severe 3v CAD. LM 75%, LAD 100%, LCX 80%, RCA 80% prox - continue ASA/statin. No b-blocker with shock - CABG when able  3. AKI - baseline creatinine 1.3. likely due to ATN from shock. - still making urine - watch closely for worsening hemolysis form Impella  4 Acute Respiratory Failure  - remains intubated. Keep intubated today - possibly wean in am  - D/w  CCM  5. PNA  . PCT 6.85 - Antibiotics per CCM (vanc/cefepime) - CX NGTD  6. LBBB  CRITICAL CARE Performed by: Glori Bickers  Total critical care time: 60 minutes  Critical care time was exclusive of separately billable procedures and treating other patients.  Critical care was necessary to treat or prevent imminent or life-threatening deterioration.  Critical care was time spent personally by me (independent of midlevel providers or residents) on the following activities: development of treatment plan with patient and/or surrogate as well as nursing, discussions with consultants, evaluation of patient's response to treatment, examination of patient, obtaining history from patient or surrogate, ordering and performing treatments and interventions, ordering and review of laboratory studies, ordering and review of radiographic studies, pulse oximetry and re-evaluation of patient's condition.  Length of Stay: 2   Glori Bickers MD 05/14/2019, 9:59 AM  Advanced Heart Failure Team Pager [phone] (M-F; [place])  Please contact [facility] for night-coverage after hours (4p -7a ) and weekends on [email or web address]

## 2019-05-18 NOTE — Progress Notes (Signed)
ANTICOAGULATION CONSULT NOTE - Follow Up Consult  Pharmacy Consult for Heparin  Indication: Impella  No Known Allergies  Patient Measurements: Height: 5\' 7"  (170.2 cm)(measured x3) Weight: 156 lb 15.5 oz (71.2 kg) IBW/kg (Calculated) : 66.1  Vital Signs: Temp: 99.7 F (37.6 C) (10/24 0800) BP: 82/61 (10/24 0800)  Labs: Recent Labs    05/01/2019 0022  05/22/2019 2055 05/22/2019 2325  05/23/2019 0504  04/28/2019 0900  05/09/2019 1504 05/19/2019 1600 05/13/2019 1636 04/30/2019 1913 05/22/2019 2202 04/28/2019 0350 05/13/2019 0531  HGB  --    < >  --   --    < >  --    < > 16.3   < > 14.7  --  14.6  --  13.3 13.2  --   HCT  --    < >  --   --    < >  --    < > 51.1   < > 45.8  --  43.0  --  39.0 38.6*  --   PLT  --    < >  --   --    < >  --   --  204  --  149*  --   --   --   --  139*  --   APTT 52*  --   --   --   --   --   --   --   --   --   --   --   --   --   --   --   LABPROT 20.0*  --   --   --   --   --   --   --   --   --   --   --   --   --   --   --   INR 1.7*  --   --   --   --   --   --   --   --   --   --   --   --   --   --   --   HEPARINUNFRC  --   --   --   --   --  0.17*  --   --   --   --   --   --   --   --  0.41  --   CREATININE  --    < >  --   --    < >  --   --  2.31*   < >  --  2.48*  --  2.49*  --  2.79*  --   TROPONINIHS  --    < > 1,456* 4,254*  --   --   --   --   --   --   --   --   --   --   --  16,606*   < > = values in this interval not displayed.    Estimated Creatinine Clearance: 23.4 mL/min (A) (by C-G formula based on SCr of 2.79 mg/dL (H)).   Assessment: 45 yoM admitted with shock and concern for ACS now s/p Impella CP placement in cath lab. Pharmacy to assist with heparin dosing.  Purge rate: 12.6 (630 units/hr of heparin) Systemic heparin up to 1300 units/hr ACT= 164 this morning Heparin level 0.41  Hgb stable 13s, plt count trending down 200s>>130s No bleeding noted.    Goal of Therapy:  ACT 160-180  Monitor platelets by anticoagulation  protocol: Yes   Plan:  ACT/heparin level at goal this am Will need to watch pltc closely with downward trend  Erin Hearing PharmD., BCPS Clinical Pharmacist 05/10/2019 8:12 AM

## 2019-05-18 NOTE — Progress Notes (Signed)
[facility]    NEPHROLOGY PROGRESS NOTE  SUBJECTIVE: Patient seen and examined earlier this morning.  Patient sedated, unable to provide review of systems.  No acute events.   OBJECTIVE:  Vitals:   05/15/2019 1500 05/25/2019 1507  BP: 99/85   Pulse:    Resp: (!) 23   Temp: 99.1 F (37.3 C)   SpO2: 96% 96%    Intake/Output Summary (Last 24 hours) at 05/14/2019 1532 Last data filed at 05/12/2019 1500 Gross per 24 hour  Intake 4439.16 ml  Output 4240 ml  Net 199.16 ml      General: Sedated NAD HEENT: MMM [place] AT anicteric sclera, orally intubated Neck:  No JVD, no adenopathy CV:  Heart RRR  Lungs:  L/S CTA bilaterally Abd:  abd SNT/ND with normal BS GU:  Bladder non-palpable, positive Foley with blood-tinged urine Extremities: +2 bilateral lower extremity edema Skin:  No skin rash  MEDICATIONS:  . aspirin  81 mg Per Tube Daily  . atorvastatin  80 mg Per Tube q1800  . chlorhexidine gluconate (MEDLINE KIT)  15 mL Mouth Rinse BID  . Chlorhexidine Gluconate Cloth  6 each Topical Daily  . insulin aspart  1-3 Units Subcutaneous Q4H  . mouth rinse  15 mL Mouth Rinse 10 times per day  . pantoprazole (PROTONIX) IV  40 mg Intravenous QHS  . sodium chloride flush  10-40 mL Intracatheter Q12H  . sodium chloride flush  3 mL Intravenous Q12H  . vancomycin variable dose per unstable renal function (pharmacist dosing)   Does not apply See admin instructions       LABS:   CBC Latest Ref Rng & Units 05/23/2019 04/25/2019 04/25/2019  WBC 4.0 - 10.5 K/uL - 19.0(H) -  Hemoglobin 13.0 - 17.0 g/dL 13.6 13.2 13.3  Hematocrit 39.0 - 52.0 % 40.0 38.6(L) 39.0  Platelets 150 - 400 K/uL - 139(L) -    CMP Latest Ref Rng & Units 05/15/2019 05/13/2019 05/24/2019  Glucose 70 - 99 mg/dL - 147(H) 153(H)  BUN 8 - 23 mg/dL - 48(H) 43(H)  Creatinine 0.61 - 1.24 mg/dL - 3.05(H) 2.79(H)  Sodium 135 - 145 mmol/L 137 138 137  Potassium 3.5 - 5.1 mmol/L 3.5 3.6 4.3  Chloride 98 - 111  mmol/L - 97(L) 98  CO2 22 - 32 mmol/L - 27 27  Calcium 8.9 - 10.3 mg/dL - 8.4(L) 8.4(L)  Total Protein 6.5 - 8.1 g/dL - 5.7(L) 5.5(L)  Total Bilirubin 0.3 - 1.2 mg/dL - 2.7(H) 3.3(H)  Alkaline Phos 38 - 126 U/L - 63 63  AST 15 - 41 U/L - 130(H) 128(H)  ALT 0 - 44 U/L - 30 25    Lab Results  Component Value Date   CALCIUM 8.4 (L) 04/26/2019   CAION 1.13 (L) 04/26/2019   PHOS 4.0 05/06/2019       Component Value Date/Time   COLORURINE AMBER (A) 05/20/2019 1029   APPEARANCEUR HAZY (A) 05/13/2019 1029   LABSPEC 1.010 05/22/2019 1029   PHURINE 6.0 05/07/2019 1029   GLUCOSEU NEGATIVE 05/22/2019 1029   HGBUR LARGE (A) 04/26/2019 1029   BILIRUBINUR NEGATIVE 05/03/2019 1029   KETONESUR NEGATIVE 05/15/2019 1029   PROTEINUR 100 (A) 04/29/2019 1029   NITRITE NEGATIVE 05/25/2019 1029   LEUKOCYTESUR NEGATIVE 05/08/2019 1029      Component Value Date/Time   PHART 7.491 (H) 05/20/2019 1331   PCO2ART 43.8 05/25/2019 1331   PO2ART 90.0 05/14/2019 1331   HCO3 33.4 (H) 05/15/2019 1331   TCO2  35 (H) 05/07/2019 1331   ACIDBASEDEF 6.0 (H) 05/09/2019 1636   O2SAT 97.0 04/30/2019 1331    No results found for: IRON, TIBC, FERRITIN, IRONPCTSAT     ASSESSMENT/PLAN:     1.  Baseline serum creatinine 1.34 on admission.  2.  Acute kidney injury.  Likely secondary to decreased cardiac output in the setting of congestive heart failure.  He may have had some component of contrast-induced nephropathy.    Urine output is excellent. Continue current management with diuretics.  Creatinine is up trending.  We will need to watch closely.  3.  Coronary artery disease.  Triple-vessel disease noted on cardiac catheterization.  For possible CABG.  4.  Acute hypoxemic respiratory failure.  Secondary to congestive heart failure.  Continue diuresis.  Being covered for possible pneumonia as well.  5.  Acute systolic congestive heart failure.  Ejection fraction less than 20%.  Continue balloon pump support  and diuresis.  ACE inhibitor on hold in the setting of acute kidney injury.    [place], DO, [place]

## 2019-05-18 NOTE — Anesthesia Preprocedure Evaluation (Signed)
Anesthesia Evaluation  Patient identified by MRN, date of birth, ID band Patient unresponsive  Preop documentation limited or incomplete due to emergent nature of procedure.  Airway Mallampati: Intubated  TM Distance: >3 FB     Dental  (+) Dental Advisory Given   Pulmonary pneumonia, Current Smoker,    breath sounds clear to auscultation       Cardiovascular hypertension, + Past MI and +CHF   Rhythm:Regular Rate:Normal     Neuro/Psych    GI/Hepatic negative GI ROS, Neg liver ROS,   Endo/Other  negative endocrine ROS  Renal/GU ARFRenal disease     Musculoskeletal   Abdominal   Peds  Hematology negative hematology ROS (+)   Anesthesia Other Findings   Reproductive/Obstetrics                             Lab Results  Component Value Date   WBC 19.0 (H) 05/20/2019   HGB 13.6 05/08/2019   HCT 40.0 05/25/2019   MCV 88.9 04/26/2019   PLT 139 (L) 05/09/2019   Lab Results  Component Value Date   CREATININE 3.05 (H) 05/23/2019   BUN 48 (H) 05/11/2019   NA 137 05/08/2019   K 3.5 04/28/2019   CL 97 (L) 05/15/2019   CO2 27 04/29/2019    Anesthesia Physical Anesthesia Plan  ASA: IV and emergent  Anesthesia Plan: General   Post-op Pain Management:    Induction: Inhalational  PONV Risk Score and Plan: 1 and Treatment may vary due to age or medical condition  Airway Management Planned: Oral ETT  Additional Equipment: Arterial line, CVP, PA Cath and TEE  Intra-op Plan:   Post-operative Plan: Post-operative intubation/ventilation  Informed Consent: I have reviewed the patients History and Physical, chart, labs and discussed the procedure including the risks, benefits and alternatives for the proposed anesthesia with the patient or authorized representative who has indicated his/her understanding and acceptance.       Plan Discussed with: CRNA  Anesthesia Plan Comments:          Anesthesia Quick Evaluation

## 2019-05-18 NOTE — Transfer of Care (Signed)
Immediate Anesthesia Transfer of Care Note  Patient: Nimai Burbach  Procedure(s) Performed: INSERTION OF IMPELLA 5.0 IMPLANTABLE LEFT VENTRICULAR ASSIST DEVICE THROUGH RIGHT AXILLARY; REMOVAL OF RIGHT GROIN IMPELLA CP (N/A Chest) right femoral artery repair and left heart catheterization  Patient Location: SICU  Anesthesia Type:General  Level of Consciousness: sedated  Airway & Oxygen Therapy: Patient remains intubated per anesthesia plan and Patient placed on Ventilator (see vital sign flow sheet for setting)  Post-op Assessment: Report given to RN and Post -op Vital signs reviewed and stable  Post vital signs: Reviewed and stable  Last Vitals:  Vitals Value Taken Time  BP 114/93 05/06/2019 2051  Temp    Pulse    Resp 16 05/07/2019 2054  SpO2 98 % 05/15/2019 2054  Vitals shown include unvalidated device data.  Last Pain:  Vitals:   05/25/2019 0800  TempSrc:   PainSc: 0-No pain         Complications: No apparent anesthesia complications

## 2019-05-18 NOTE — Progress Notes (Signed)
Sputum collection sent to the lab

## 2019-05-18 NOTE — Brief Op Note (Signed)
05/18/2019  9:10 PM  PATIENT:  Raymond Chavez  68 y.o. male  PRE-OPERATIVE DIAGNOSIS:  cardiogenic shock  POST-OPERATIVE DIAGNOSIS:  cardiogenic shock  PROCEDURE:  Procedure(s): INSERTION OF IMPELLA 5.0 IMPLANTABLE LEFT VENTRICULAR ASSIST DEVICE THROUGH RIGHT AXILLARY; REMOVAL OF RIGHT GROIN IMPELLA CP (N/A) right femoral artery repair and left heart catheterization  SURGEON:  Surgeon(s) and Role:    * Wonda Olds, MD - Primary  PHYSICIAN ASSISTANT: na  ASSISTANTS: staff   ANESTHESIA:   general  EBL:  300 mL   BLOOD ADMINISTERED:none  DRAINS: none   LOCAL MEDICATIONS USED:  NONE  SPECIMEN:  No Specimen  DISPOSITION OF SPECIMEN:  N/A  COUNTS:  YES  TOURNIQUET:  * No tourniquets in log *  DICTATION: .Note written in EPIC  PLAN OF CARE: Admit to inpatient   PATIENT DISPOSITION:  ICU - intubated and critically ill.   Delay start of Pharmacological VTE agent (>24hrs) due to surgical blood loss or risk of bleeding: yes  Raymond Chavez Z. [name], Raymond Chavez

## 2019-05-18 NOTE — Plan of Care (Signed)
  Problem: Education: Goal: Knowledge of the prescribed therapeutic regimen will improve Outcome: Progressing   Problem: Cardiac: Goal: Ability to maintain an adequate cardiac output will improve Outcome: Progressing   Problem: Fluid Volume: Goal: Risk for excess fluid volume will decrease Outcome: Progressing

## 2019-05-18 NOTE — Progress Notes (Addendum)
NAME:  Raymond Chavez, MRN:  [id number], DOB:  01/20/1951, LOS: 2 ADMISSION DATE:  2018-12-18, CONSULTATION DATE:  15-Jun-2019 REFERRING MD:  Dalene SeltzerSchlossman  CHIEF COMPLAINT:  SOB   Brief History   Raymond Chavez is a 68 y.o. male who has a PMH including but not limited to HTN, CAD.  He [facility] ED 10/22 with dyspnea and left sided chest pain.  He was placed on BiPAP given his increased WOB; however, he failed and required intubation.  Workup in ED included CTA chest which was negative for PE but did demonstrate bilateral basilar PNA, acute pulmonary edema, hilar adenopathy.  Labs noteable for BNP 2,725, trop 168 > 712, WBC 20.  EKG with LBBB (no baseline to compare).  He has no echo on file.  He was started on heparin gtt for empiric ACS / NSTEMI as well as vanc / cefepime for PNA.  He did receive 60mg  lasix while in ED.  Cardiology was consulted by EDP and PCCM called for admission.  Bedside POCUS by cardiology reportedly with low LV output.  Past Medical History  HTN, CAD.  Significant Hospital Events   10/22 > admit.  10/23 - Awake and alert on vent 40% Off pressors and milrinone Co-Ox 71%, CVP 18 Net negative 89 Echo with EF if 15-20%,akinetic inferior wall and anteroseptal wall and apex.  Lactic Acid 4.5>4.7>2.5   HS Trop 409>811>9147>168>712>1456> 4254  Setting up for emergent  RHC/LHC  10/23 am  to evaluate for need for mechanical support   Consults:  Cardiology.  Procedures:  ETT 10/22 >  [place]wan pending 10/22 >   Significant Diagnostic Tests:  CTA chest 10/22 > no PE, b/l basilar consolidation, diffuse interlobular septal thickening with GGO's, b/l hilar adenopathy. Echo 10/23 > EF 15-20% with akinetic inferior wall and anteroseptal wall and apex.  The lateral wall was hypokinetic but best-preserved.  Mildly decreased RV systolic function.  Visually, probably mild aortic stenosis though no significant gradient in setting of low EF.  Micro Data:  Blood 10/22 >  Sputum 10/22 >  Urine  10/22 >  RVP 10/22 > Negative SARS CoV2 10/22 > neg. Urine strep 10/22 - neg MRSA PCR 10/22 - neg  Antimicrobials:  Vanc 10/22 >  Cefepime 10/22 >    Interim history/subjective:   05/07/2019  - CVTS concerned about hemolyzing with impella. Per RN - workup ordered by cards. Ur OP at 150cc/h though creat up. Responded well to lasix.Bic gtt stopped by cards. On 40% fio2. On milironine gtt and levophed gtt. amd je[arom gtt On levophed gtt and versed gtt - > RASS -3 and squeezed fingers    Objective:  Blood pressure 92/72, pulse 92, temperature 99.5 F (37.5 C), resp. rate 14, height 5\' 7"  (1.702 m), weight 71.2 kg, SpO2 96 %. PAP: (63-90)/(23-42) 63/25 CVP:  [5 mmHg-68 mmHg] 10 mmHg PCWP:  [16 mmHg-19 mmHg] 19 mmHg CO:  [2.2 L/min-4.4 L/min] 3.4 L/min CI:  [1.2 L/min/m2-2.4 L/min/m2] 1.8 L/min/m2  Vent Mode: CPAP;PSV FiO2 (%):  [40 %] 40 % Set Rate:  [18 bmp-20 bmp] 18 bmp Vt Set:  [520 mL] 520 mL PEEP:  [5 cmH20] 5 cmH20 Pressure Support:  [10 cmH20] 10 cmH20 Plateau Pressure:  [15 cmH20-18 cmH20] 15 cmH20   Intake/Output Summary (Last 24 hours) at 04/26/2019 0938 Last data filed at 05/24/2019 0900 Gross per 24 hour  Intake 4061.3 ml  Output 4255 ml  Net -193.7 ml   Filed Weights   15-Jun-2019 2030 05/13/2019  7062 05/12/2019 0658  Weight: 72.6 kg 70.5 kg 71.2 kg    General Appearance:  Looks criticall ill Head:  Normocephalic, without obvious abnormality, atraumatic Eyes:  PERRL - yes, conjunctiva/corneas - muddy     Ears:  Normal external ear canals, both ears Nose:  G tube - no Throat:  ETT TUBE - yes , OG tube - yes Neck:  Supple,  No enlargement/tenderness/nodules Lungs: Clear to auscultation bilaterally, Ventilator   Synchrony - yes, 40% Heart:  S1 and S2 normal, no murmur, CVP - no.  Pressors - YES Abdomen:  Soft, no masses, no organomegaly Genitalia / Rectal:  Not done Extremities:  Extremities- intact with Rt groin impella Skin:  ntact in exposed areas . Sacral  area - not examined Neurologic:  Sedation - fent gtt, versed gtt -> RASS - -4 . Moves all 4s - yes. CAM-ICU - cannot asses . Orientation - cannot assess      Assessment & Plan:   Acute hypoxic respiratory failure requiring intubation - multifactorial due to PNA, acute pulmonary edema (s/p 60mg  lasix in ED), ACS / NSTEMI.    05/21/2019 - > does not meet criteria for SBT/Extubation in setting of Acute Respiratory Failure due to circulatory shock    PLAN PRVC Hob > 30 deg and vent bundle   ACS / NSTEMI with probable low output CHF. Echo confirms EF of 15-20% with akinetic akinetic inferior wall and anteroseptal wall and apex.  Septic/ Cardiogenic shock   05/20/2019 on impellaa + levophed + milrinone + heparing gtt. Needs CABG. Hematuria  + from impella  Plan - Per cards and CVTS -  Heparin gtt, milrinone, levophjed + impella  + asa + lipitor   Hematuria from Impella - concern for hemoysis with rising Bili  Plan  - per CHF service; workup ordered   AKI in setting of cardiogenic/ septic shock + due  05/21/2019 - respnsded to 160mg  lasix and bic gtt and though creat up his Ur OP has picked up  PLAn  - per renal  - Avoid nephrotoxic medications - Maintaing BP/HR   At risk acute encephalopathy Sedation needs on ventilator  05/06/2019 - RASS -3 and approproiate  Plan RASS goal 0 to  -3 fent gtt vresed gtt   Concern for sepsis +/- aspiration pneumonia  Plan  - dc vanc (MRSA PCR neg and urine strep negative)  - cefepime   Hx HTN, CAD. - per Cards.   Best Practice:  Diet: NPO. Pain/Anxiety/Delirium protocol (if indicated): Propofol gtt / Fentanyl PRN.  RASS goal 0 to -3 VAP protocol (if indicated): In place. DVT prophylaxis: SCD's / Heparin gtt. GI prophylaxis: PPI. Glucose control: SSI. Mobility: Bedrest. Code Status: Full. Family Communication: CCM MD called daughter Carmelia Bake 05/20/2019 and updated her. She plans to bring patient sister - MD  Dorette Grate this as long as one vistor at a time at bedside  Disposition: ICU.   ATTESTATION & SIGNATURE   The patient Raymond Chavez is critically ill with multiple organ systems failure and requires high complexity decision making for assessment and support, frequent evaluation and titration of therapies, application of advanced monitoring technologies and extensive interpretation of multiple databases.   Critical Care Time devoted to patient care services described in this note is  30  Minutes. This time reflects time of care of this signee Dr Kalman Shan. This critical care time does not reflect procedure time, or teaching time or supervisory time of PA/NP/Med student/Med Resident etc but could involve  care discussion time     Dr. Brand Males, M.D., [facility].C.P Pulmonary and Critical Care Medicine Staff Physician [place] [facility] Pager: [phone], If no answer or between  15:00h - 7:00h: call [phone]  05/19/2019 9:39 AM    LABS  Results for Raymond Chavez, Raymond Chavez (MRN [id number]) as of 05/13/2019 09:59  Ref. Range 05/15/2019 15:15 04/30/2019 18:34 04/30/2019 20:55 05/04/2019 23:25 05/02/2019 05:31  Troponin I (High Sensitivity) Latest Ref Range: <18 ng/L 168 (HH) 712 (HH) 1,456 (HH) 4,254 (HH) 16,606 (HH)    PULMONARY Recent Labs  Lab 05/23/2019 1929  05/02/2019 0111  05/03/2019 0619 05/04/2019 1015 05/15/2019 1016 04/27/2019 1245 04/27/2019 1636 05/20/2019 2202 05/24/2019 0420  PHART 7.108*  --  7.237*  --  7.255*  --   --   --  7.319* 7.480*  --   PCO2ART 44.5  --  35.2  --  32.8  --   --   --  37.5 39.8  --   PO2ART 101.0  --  92.0  --  91.0  --   --   --  109.0* 106.0  --   HCO3 14.2*  --  15.2*  --  14.4* 18.4* 18.2*  --  19.2* 29.5*  --   TCO2 16*  --  16*  --  15* 20* 19*  --  20* 31  --   O2SAT 95.0   < > 96.0   < > 95.0 59.0 60.0 72.1 98.0 98.0 67.7   < > = values in this interval not displayed.    CBC Recent Labs  Lab  04/26/2019 0900  05/19/2019 1504 04/26/2019 1636 04/27/2019 2202 05/24/2019 0350  HGB 16.3   < > 14.7 14.6 13.3 13.2  HCT 51.1   < > 45.8 43.0 39.0 38.6*  WBC 20.8*  --  15.9*  --   --  19.0*  PLT 204  --  149*  --   --  139*   < > = values in this interval not displayed.    COAGULATION Recent Labs  Lab 05/25/2019 0022  INR 1.7*    CARDIAC  No results for input(s): TROPONINI in the last 168 hours. No results for input(s): PROBNP in the last 168 hours.   CHEMISTRY Recent Labs  Lab 05/04/2019 0424  04/29/2019 0900  05/19/2019 1234 04/27/2019 1504 04/27/2019 1600 05/11/2019 1636 05/20/2019 1913 05/02/2019 2202 05/05/2019 0350  NA 134*   < > 138   < > 133*  --  134* 136 139 140 137  K 5.1   < > 5.7*   < > 5.5*  --  5.4* 5.3* 4.0 3.7 4.3  CL 107  --  109  --  105  --  105  --  101  --  98  CO2 16*  --  16*  --  17*  --  17*  --  23  --  27  GLUCOSE 144*  --  143*  --  128*  --  162*  --  162*  --  153*  BUN 22  --  26*  --  30*  --  34*  --  37*  --  43*  CREATININE 2.08*  --  2.31*  --  2.23*  --  2.48*  --  2.49*  --  2.79*  CALCIUM 8.5*  --  8.2*  --  7.8*  --  7.9*  --  9.2  --  8.4*  MG 2.1  --   --   --   --  2.0 2.0  --   --   --  2.5*  PHOS 4.3  --   --   --   --  5.5* 5.8*  --   --   --  4.0   < > = values in this interval not displayed.   Estimated Creatinine Clearance: 23.4 mL/min (A) (by C-G formula based on SCr of 2.79 mg/dL (H)).   LIVER Recent Labs  Lab 05/01/2019 0022 04/29/2019 1834 05/10/2019 0424 04/28/2019 0350  AST  --  33 70* 128*  ALT  --  23 40 25  ALKPHOS  --  108 86 63  BILITOT  --  1.4* 1.8* 3.3*  PROT  --  7.8 6.7 5.5*  ALBUMIN  --  3.6 3.2* 2.7*  INR 1.7*  --   --   --      INFECTIOUS Recent Labs  Lab 05/15/2019 0900 05/09/2019 1503 05/23/2019 1752 05/10/2019 0350 05/25/2019 0448  LATICACIDVEN  --  2.3* 2.1*  --  2.1*  PROCALCITON 5.55  --   --  6.85  --      ENDOCRINE CBG (last 3)  Recent Labs    05/22/2019 0004 05/23/2019 0400 05/20/2019 0804  GLUCAP 168*  157* 157*         IMAGING x48h  - image(s) personally visualized  -   highlighted in bold Ct Angio Chest Pe W And/or Wo Contrast  Result Date: 05/10/2019 CLINICAL DATA:  Shortness of breath. EXAM: CT ANGIOGRAPHY CHEST WITH CONTRAST TECHNIQUE: Multidetector CT imaging of the chest was performed using the standard protocol during bolus administration of intravenous contrast. Multiplanar CT image reconstructions and MIPs were obtained to evaluate the vascular anatomy. CONTRAST:  64mL OMNIPAQUE IOHEXOL 350 MG/ML SOLN COMPARISON:  12/05/2017 FINDINGS: Cardiovascular: The main pulmonary artery is patent. There is no central obstructing or saddle embolus identified. No lobar pulmonary artery filling defects for segmental pulmonary artery filling defects identified. Normal heart size. No pericardial effusion. Aortic atherosclerosis. 3 vessel coronary artery calcifications. Mediastinum/Nodes: The endotracheal tube tip is above the carina. There is a nasogastric tube with tip in the stomach. No supraclavicular, axillary or mediastinal adenopathy. Diffuse increased soft tissue within bilateral hilar regions identified, new from previous exam. For example, within the right hilar region there is increased soft tissue measuring 3.7 x 2.9 cm. Within the left suprahilar region there is increased soft tissue measuring 3.1 x 2.0 cm. Lungs/Pleura: There is dense bilateral lower lobe posterior airspace consolidation. There is also bilateral upper lobe posterior subpleural consolidation. Diffuse interlobular septal thickening with areas of ground-glass attenuation identified compatible with pulmonary edema. Upper Abdomen: No acute abnormality. Musculoskeletal: No chest wall abnormality. No acute or significant osseous findings. Review of the MIP images confirms the above findings. IMPRESSION: 1. No evidence for acute pulmonary embolus. 2. Bilateral lower lobe posterior subpleural consolidation is identified compatible with  multifocal pneumonia. 3. Diffuse interlobular septal thickening, ground-glass attenuation and areas of ground-glass attenuation compatible with pulmonary edema which may be secondary to heart failure 4. Interval development of bilateral hilar adenopathy which may be reactive in etiology. Differential considerations include granulomatous inflammation/infection, lymphoproliferative disorder or metastatic adenopathy. This is may also be nonspecific in the setting of CHF. Advise follow-up imaging in 3 months with repeat CT of the chest with contrast. This recommendation follows ACR consensus guidelines: Managing Incidental Findings on Thoracic CT: Mediastinal and Cardiovascular Findings. A White Paper of the  ACR Incidental Findings Committee. J Am Coll Radiol. 2018; 15: 4196-2229. 5. 3 vessel coronary artery calcifications noted. Aortic Atherosclerosis (ICD10-I70.0). Electronically Signed   By: Signa Kell M.D.   On: 05/05/2019 20:18   Dg Chest Port 1 View  Result Date: 05/13/2019 CLINICAL DATA:  Respiratory failure EXAM: PORTABLE CHEST 1 VIEW COMPARISON:  05/15/2019 FINDINGS: Endotracheal tube, gastric catheter and right-sided PICC line are noted in satisfactory position. Cardiac shadow is mildly enlarged but stable. Aortic calcifications are again seen. Vascular congestion is noted patchy parenchymal opacities bilaterally likely related to edema. Left retrocardiac density remains. No bony abnormality is seen. IMPRESSION: Vascular congestion and patchy opacities likely related to edema. Left retrocardiac atelectasis. Electronically Signed   By: Alcide Clever M.D.   On: 05/09/2019 07:37   Dg Chest Portable 1 View  Result Date: 05/11/2019 CLINICAL DATA:  Intubation. Image 2 after pulled back. EXAM: PORTABLE CHEST 1 VIEW COMPARISON:  05/13/2019 FINDINGS: Endotracheal tube has been placed. On the initial image the tube is 3.6 centimeters above the carina. On the second image, tube is approximately 5.4  centimeters above the carina. Nasogastric tube is identified, tip beyond the level of the proximal stomach. Heart size is normal. There are hazy infiltrates throughout the lungs bilaterally, slightly increased since the prior study. Opacity at the LEFT lung base is more confluent. IMPRESSION: 1. Interval placement of endotracheal tube. 2. Increased bilateral infiltrates. 3. More confluent opacity at the LEFT lung base. Electronically Signed   By: Norva Pavlov M.D.   On: 04/29/2019 18:57   Dg Chest Port 1 View  Result Date: 05/08/2019 CLINICAL DATA:  Chest pain. EXAM: PORTABLE CHEST 1 VIEW COMPARISON:  September 18, 2018. FINDINGS: The heart size and mediastinal contours are within normal limits. No pneumothorax or pleural effusion is noted. Multiple small ill-defined interstitial densities are noted throughout both lungs which may represent focal inflammation. The visualized skeletal structures are unremarkable. IMPRESSION: Multiple small ill-defined interstitial densities are noted throughout both lungs which potentially may represent multifocal inflammation or possibly edema. Electronically Signed   By: Lupita Raider M.D.   On: 05/22/2019 16:15   [place] Ekg Site Rite  Result Date: 05/11/2019 If Site Rite image not attached, placement could not be confirmed due to current cardiac rhythm.

## 2019-05-18 NOTE — Op Note (Signed)
Procedure(s): INSERTION OF IMPELLA 5.0 IMPLANTABLE LEFT VENTRICULAR ASSIST DEVICE THROUGH RIGHT AXILLARY; REMOVAL OF RIGHT GROIN IMPELLA CP right femoral artery repair and left heart catheterization Procedure Note  Raymond Chavez male 68 y.o. 05/25/2019  Procedure(s) and Anesthesia Type:    * INSERTION OF IMPELLA 5.0 IMPLANTABLE LEFT VENTRICULAR ASSIST DEVICE THROUGH RIGHT AXILLARY; REMOVAL OF RIGHT GROIN IMPELLA CP - General    * right femoral artery repair and left heart catheterization  Surgeon(s) and Role:    Linden Dolin, MD - Primary   Indications: The patient was admitted to the hospital with a brief history of chest pain; found to be in cardiogenic shock with ischemic etiology. Right CFA Impella CP device placed yesterday with subsequent cool right foot and questionable hemolysis. The patient now presents for Impella 5.0 via right axillary artery after discussing therapeutic alternatives with daughter.     Surgeon: Linden Dolin   Assistants: staff  Anesthesia: General endotracheal anesthesia  ASA Class: 5    Procedure Detail  INSERTION OF IMPELLA 5.0 IMPLANTABLE LEFT VENTRICULAR ASSIST DEVICE THROUGH RIGHT AXILLARY; REMOVAL OF RIGHT GROIN IMPELLA CP, right femoral artery repair and left heart catheterizationp; TEE  After obtaining consent from the patient's family, he was taken to the operating room on 05/24/2019.  Anesthesia was confirmed to be at an appropriate level.  The anterior chest neck groins and abdomen were prepped and draped sterilely.  A preop surgical pause was performed.  An incision was made in the right deltopectoral groove.  Dissection was carried down to the pectoralis major and minor muscles.  The right axillary artery was encircled.  5000 units of heparin were given intravenously.  The CP Impella was pulled back across the aortic valve to allow the new Impella to be inserted without competition.  A 10 mm dacryon graft was brought onto the field  and sewn end-to-side to the axillary artery with running 5-0 Prolene.  The graft was then tunneled laterally.  Next, using fluoroscopic guidance a wire was placed into the central circulation and the aortic valve was crossed as guided by fluoroscopy and TEE.  This floppy wire was then followed by the Impella 5.0 LVAD device.  The device was seated for appropriate positioning by TEE guidance.  Next, attention was turned to the right groin where an incision was made just superior to the insertion site of the Impella device.  Dissection was carried down through the subcutaneous tissue with electrocautery.  The right femoral sheath was encountered.  A pursestring suture was placed around the entrance of the Impella into the femoral artery.  The Impella was removed allowing blood to emanate from the arteriotomy before tying down the pursestring suture.  Evaluation of the foot immediately after demonstrated a strongly dopplerable signal at the right posterior tibial.  Both wounds were then closed in layers.  Sterile dressings were applied.  All sponge instrument and needle counts were correct.  I performed the procedure.  Estimated Blood Loss:  200 mL         Drains: None         Total IV Fluids: Per anesthesia  Blood Given: none          Specimens: None         Implants: Right axillary Impella        Complications:  * No complications entered in OR log *         Disposition: ICU - intubated and critically ill.  Condition: stable  Darchelle Nunes Z. [name], [place]

## 2019-05-18 NOTE — Progress Notes (Signed)
1 Day Post-Op Procedure(s) (LRB): RIGHT/LEFT HEART CATH AND CORONARY ANGIOGRAPHY (N/A) VENTRICULAR ASSIST DEVICE INSERTION (N/A) Subjective:intubated/sedated  Objective: Vital signs in last 24 hours: Temp:  [98.4 F (36.9 C)-100.2 F (37.9 C)] 99.7 F (37.6 C) (10/24 0800) Pulse Rate:  [88-113] 92 (10/23 1227) Cardiac Rhythm: Normal sinus rhythm (10/24 0800) Resp:  [0-30] 21 (10/24 0800) BP: (77-133)/(57-99) 82/61 (10/24 0800) SpO2:  [93 %-99 %] 98 % (10/24 0842) Arterial Line BP: (66-113)/(44-70) 82/54 (10/24 0800) FiO2 (%):  [40 %] 40 % (10/24 0842) Weight:  [71.2 kg] 71.2 kg (10/24 0658)  Hemodynamic parameters for last 24 hours: PAP: (64-90)/(23-42) 68/24 CVP:  [5 mmHg-68 mmHg] 11 mmHg PCWP:  [16 mmHg-19 mmHg] 19 mmHg CO:  [2.2 L/min-4.4 L/min] 3.4 L/min CI:  [1.2 L/min/m2-2.4 L/min/m2] 1.8 L/min/m2  Intake/Output from previous day: 10/23 0701 - 10/24 0700 In: 3603.1 [I.V.:2165.6; NG/GT:900; IV Piggyback:289.3] Out: 4080 [Urine:4080] Intake/Output this shift: Total I/O In: 319.2 [I.V.:216; Other:13.2; NG/GT:90] Out: 150 [Urine:150]  General appearance: pale and sedated Neurologic: intact Heart: frequent PVCs Lungs: rales bilaterally Abdomen: soft, non-tender; bowel sounds normal; no masses,  no organomegaly Extremities: cool RLE  Lab Results: Recent Labs    05/21/2019 1504  04/25/2019 2202 05/10/2019 0350  WBC 15.9*  --   --  19.0*  HGB 14.7   < > 13.3 13.2  HCT 45.8   < > 39.0 38.6*  PLT 149*  --   --  139*   < > = values in this interval not displayed.   BMET:  Recent Labs    05/04/2019 1913 05/11/2019 2202 04/28/2019 0350  NA 139 140 137  K 4.0 3.7 4.3  CL 101  --  98  CO2 23  --  27  GLUCOSE 162*  --  153*  BUN 37*  --  43*  CREATININE 2.49*  --  2.79*  CALCIUM 9.2  --  8.4*    PT/INR:  Recent Labs    05/04/2019 0022  LABPROT 20.0*  INR 1.7*   ABG    Component Value Date/Time   PHART 7.480 (H) 05/04/2019 2202   HCO3 29.5 (H) 05/12/2019 2202    TCO2 31 04/26/2019 2202   ACIDBASEDEF 6.0 (H) 05/23/2019 1636   O2SAT 67.7 05/19/2019 0420   CBG (last 3)  Recent Labs    05/04/2019 0004 05/07/2019 0400 05/22/2019 0804  GLUCAP 168* 157* 157*    Assessment/Plan: S/P Procedure(s) (LRB): RIGHT/LEFT HEART CATH AND CORONARY ANGIOGRAPHY (N/A) VENTRICULAR ASSIST DEVICE INSERTION (N/A) 68 yo man with multivessel cad and CHF; hemolyzing with Impella CP. Suggest changing for Impella 5.0 today.   LOS: 2 days    Wonda Olds 05/24/2019

## 2019-05-19 ENCOUNTER — Inpatient Hospital Stay (HOSPITAL_COMMUNITY): Payer: PPO

## 2019-05-19 DIAGNOSIS — J9601 Acute respiratory failure with hypoxia: Secondary | ICD-10-CM | POA: Diagnosis not present

## 2019-05-19 DIAGNOSIS — I509 Heart failure, unspecified: Secondary | ICD-10-CM | POA: Diagnosis not present

## 2019-05-19 LAB — COMPREHENSIVE METABOLIC PANEL
ALT: 22 U/L (ref 0–44)
AST: 64 U/L — ABNORMAL HIGH (ref 15–41)
Albumin: 2.2 g/dL — ABNORMAL LOW (ref 3.5–5.0)
Alkaline Phosphatase: 53 U/L (ref 38–126)
Anion gap: 11 (ref 5–15)
BUN: 51 mg/dL — ABNORMAL HIGH (ref 8–23)
CO2: 25 mmol/L (ref 22–32)
Calcium: 7.8 mg/dL — ABNORMAL LOW (ref 8.9–10.3)
Chloride: 102 mmol/L (ref 98–111)
Creatinine, Ser: 3.49 mg/dL — ABNORMAL HIGH (ref 0.61–1.24)
GFR calc Af Amer: 20 mL/min — ABNORMAL LOW (ref >60)
GFR calc non Af Amer: 17 mL/min — ABNORMAL LOW (ref >60)
Glucose, Bld: 168 mg/dL — ABNORMAL HIGH (ref 70–99)
Potassium: 3.6 mmol/L (ref 3.5–5.1)
Sodium: 138 mmol/L (ref 135–145)
Total Bilirubin: 1.1 mg/dL (ref 0.3–1.2)
Total Protein: 4.8 g/dL — ABNORMAL LOW (ref 6.5–8.1)

## 2019-05-19 LAB — COOXEMETRY PANEL
Carboxyhemoglobin: 2.6 % — ABNORMAL HIGH (ref 0.5–1.5)
Carboxyhemoglobin: 2.1 % — ABNORMAL HIGH (ref 0.5–1.5)
Methemoglobin: 1.6 % — ABNORMAL HIGH (ref 0.0–1.5)
Methemoglobin: 1.5 % (ref 0.0–1.5)
O2 Saturation: 67.2 %
O2 Saturation: 66.9 %
Total hemoglobin: 9.9 g/dL — ABNORMAL LOW (ref 12.0–16.0)
Total hemoglobin: 16.2 g/dL — ABNORMAL HIGH (ref 12.0–16.0)

## 2019-05-19 LAB — GLUCOSE, CAPILLARY
Glucose-Capillary: 159 mg/dL — ABNORMAL HIGH (ref 70–99)
Glucose-Capillary: 156 mg/dL — ABNORMAL HIGH (ref 70–99)
Glucose-Capillary: 191 mg/dL — ABNORMAL HIGH (ref 70–99)
Glucose-Capillary: 150 mg/dL — ABNORMAL HIGH (ref 70–99)
Glucose-Capillary: 155 mg/dL — ABNORMAL HIGH (ref 70–99)
Glucose-Capillary: 144 mg/dL — ABNORMAL HIGH (ref 70–99)

## 2019-05-19 LAB — LACTIC ACID, PLASMA: Lactic Acid, Venous: 1.6 mmol/L (ref 0.5–1.9)

## 2019-05-19 LAB — LEGIONELLA PNEUMOPHILA SEROGP 1 UR AG: L. pneumophila Serogp 1 Ur Ag: NEGATIVE

## 2019-05-19 LAB — PROCALCITONIN: Procalcitonin: 3.64 ng/mL

## 2019-05-19 LAB — CBC
HCT: 27.8 % — ABNORMAL LOW (ref 39.0–52.0)
Hemoglobin: 9.5 g/dL — ABNORMAL LOW (ref 13.0–17.0)
MCH: 30.4 pg (ref 26.0–34.0)
MCHC: 34.2 g/dL (ref 30.0–36.0)
MCV: 88.8 fL (ref 80.0–100.0)
Platelets: 89 10*3/uL — ABNORMAL LOW (ref 150–400)
RBC: 3.13 MIL/uL — ABNORMAL LOW (ref 4.22–5.81)
RDW: 14 % (ref 11.5–15.5)
WBC: 23 10*3/uL — ABNORMAL HIGH (ref 4.0–10.5)
nRBC: 0.3 % — ABNORMAL HIGH (ref 0.0–0.2)

## 2019-05-19 LAB — VANCOMYCIN, RANDOM: Vancomycin Rm: 17

## 2019-05-19 LAB — PHOSPHORUS: Phosphorus: 3.7 mg/dL (ref 2.5–4.6)

## 2019-05-19 LAB — MAGNESIUM: Magnesium: 2 mg/dL (ref 1.7–2.4)

## 2019-05-19 LAB — LACTATE DEHYDROGENASE: LDH: 1087 U/L — ABNORMAL HIGH (ref 98–192)

## 2019-05-19 MED ORDER — SODIUM CHLORIDE 0.9 % IV BOLUS
250.0000 mL | Freq: Once | INTRAVENOUS | Status: AC
  Administered 2019-05-19: 250 mL via INTRAVENOUS

## 2019-05-19 MED ORDER — SODIUM CHLORIDE 0.9 % IV BOLUS
250.0000 mL | Freq: Once | INTRAVENOUS | Status: AC
  Administered 2019-05-19: 04:00:00 250 mL via INTRAVENOUS

## 2019-05-19 MED ORDER — NOREPINEPHRINE 16 MG/250ML-% IV SOLN
0.0000 ug/min | INTRAVENOUS | Status: DC
  Administered 2019-05-19: 13 ug/min via INTRAVENOUS
  Administered 2019-05-19: 25 ug/min via INTRAVENOUS
  Administered 2019-05-19: 14 ug/min via INTRAVENOUS
  Administered 2019-05-21: 3 ug/min via INTRAVENOUS
  Administered 2019-05-21: 20:00:00 2 ug/min via INTRAVENOUS
  Administered 2019-05-23: 10 ug/min via INTRAVENOUS
  Administered 2019-05-24: 15:00:00 8 ug/min via INTRAVENOUS
  Administered 2019-05-26: 15 ug/min via INTRAVENOUS
  Administered 2019-05-27: 16 ug/min via INTRAVENOUS
  Administered 2019-05-28: 12 ug/min via INTRAVENOUS
  Administered 2019-05-29: 18:00:00 via INTRAVENOUS
  Administered 2019-05-31: 2 ug/min via INTRAVENOUS
  Administered 2019-06-01: 12 ug/min via INTRAVENOUS
  Administered 2019-06-02: 2 ug/min via INTRAVENOUS
  Administered 2019-06-03: 4 ug/min via INTRAVENOUS
  Administered 2019-06-05: 25 ug/min via INTRAVENOUS
  Administered 2019-06-06: 35 ug/min via INTRAVENOUS
  Administered 2019-06-08: 6 ug/min via INTRAVENOUS
  Filled 2019-05-19 (×19): qty 250

## 2019-05-19 MED ORDER — POTASSIUM CHLORIDE 20 MEQ/15ML (10%) PO SOLN
20.0000 meq | Freq: Once | ORAL | Status: AC
  Administered 2019-05-19: 20 meq
  Filled 2019-05-19: qty 15

## 2019-05-19 MED ORDER — VANCOMYCIN HCL IN DEXTROSE 1-5 GM/200ML-% IV SOLN
1000.0000 mg | Freq: Once | INTRAVENOUS | Status: AC
  Administered 2019-05-19: 09:00:00 1000 mg via INTRAVENOUS
  Filled 2019-05-19: qty 200

## 2019-05-19 MED ORDER — LIDOCAINE-EPINEPHRINE 1 %-1:100000 IJ SOLN: 10.0000 mL | Freq: Once | INTRAMUSCULAR | Status: DC

## 2019-05-19 MED ORDER — CHLORHEXIDINE GLUCONATE 0.12 % MT SOLN
OROMUCOSAL | Status: AC
  Filled 2019-05-19: qty 15

## 2019-05-19 MED ORDER — SODIUM CHLORIDE 0.9 % IV BOLUS
500.0000 mL | Freq: Once | INTRAVENOUS | Status: AC
  Administered 2019-05-19: 16:00:00 500 mL via INTRAVENOUS

## 2019-05-19 MED ORDER — LIDOCAINE-EPINEPHRINE (PF) 2 %-1:200000 IJ SOLN
20.0000 mL | Freq: Once | INTRAMUSCULAR | Status: DC
  Filled 2019-05-19: qty 20

## 2019-05-19 MED ORDER — SODIUM CHLORIDE 0.9 % IV BOLUS
250.0000 mL | Freq: Once | INTRAVENOUS | Status: AC
  Administered 2019-05-19: 06:00:00 250 mL via INTRAVENOUS

## 2019-05-19 NOTE — Progress Notes (Signed)
[facility]    NEPHROLOGY PROGRESS NOTE  SUBJECTIVE: Patient seen and examined earlier this morning.  Patient sedated, unable to provide review of systems.  Impella changed to 5.0.  no acute events.   OBJECTIVE:  Vitals:   05/19/19 1445 05/19/19 1500  BP:  104/80  Pulse:    Resp: 18 18  Temp: 98.1 F (36.7 C) 98.1 F (36.7 C)  SpO2: 100% 100%    Intake/Output Summary (Last 24 hours) at 05/19/2019 1527 Last data filed at 05/19/2019 1500 Gross per 24 hour  Intake 3735.75 ml  Output 1880 ml  Net 1855.75 ml      General: Sedated NAD HEENT: MMM [place] AT anicteric sclera, orally intubated Neck:  No JVD, no adenopathy CV:  Heart RRR  Lungs:  L/S CTA bilaterally Abd:  abd SNT/ND with normal BS GU:  Bladder non-palpable, positive Foley with clear urine Extremities: +2 bilateral lower extremity edema Skin:  No skin rash  MEDICATIONS:  . aspirin  81 mg Per Tube Daily  . atorvastatin  80 mg Per Tube q1800  . chlorhexidine gluconate (MEDLINE KIT)  15 mL Mouth Rinse BID  . Chlorhexidine Gluconate Cloth  6 each Topical Daily  . insulin aspart  1-3 Units Subcutaneous Q4H  . lidocaine-EPINEPHrine  20 mL Infiltration Once  . mouth rinse  15 mL Mouth Rinse 10 times per day  . pantoprazole (PROTONIX) IV  40 mg Intravenous QHS  . potassium chloride  20 mEq Oral Once  . sodium chloride flush  10-40 mL Intracatheter Q12H  . sodium chloride flush  3 mL Intravenous Q12H  . vancomycin variable dose per unstable renal function (pharmacist dosing)   Does not apply See admin instructions       LABS:   CBC Latest Ref Rng & Units 05/19/2019 05/22/2019 05/23/2019  WBC 4.0 - 10.5 K/uL 23.0(H) - 22.9(H)  Hemoglobin 13.0 - 17.0 g/dL 9.5(L) 10.9(L) 11.7(L)  Hematocrit 39.0 - 52.0 % 27.8(L) 32.0(L) 33.3(L)  Platelets 150 - 400 K/uL 89(L) - 120(L)    CMP Latest Ref Rng & Units 05/19/2019 05/22/2019 05/20/2019  Glucose 70 - 99 mg/dL 168(H) - 148(H)  BUN 8 - 23 mg/dL 51(H) -  51(H)  Creatinine 0.61 - 1.24 mg/dL 3.49(H) - 3.43(H)  Sodium 135 - 145 mmol/L 138 136 137  Potassium 3.5 - 5.1 mmol/L 3.6 3.5 3.4(L)  Chloride 98 - 111 mmol/L 102 - 98  CO2 22 - 32 mmol/L 25 - 25  Calcium 8.9 - 10.3 mg/dL 7.8(L) - 8.4(L)  Total Protein 6.5 - 8.1 g/dL 4.8(L) - 5.3(L)  Total Bilirubin 0.3 - 1.2 mg/dL 1.1 - 1.8(H)  Alkaline Phos 38 - 126 U/L 53 - 61  AST 15 - 41 U/L 64(H) - 118(H)  ALT 0 - 44 U/L 22 - 24    Lab Results  Component Value Date   CALCIUM 7.8 (L) 05/19/2019   CAION 1.12 (L) 05/22/2019   PHOS 3.7 05/19/2019       Component Value Date/Time   COLORURINE AMBER (A) 05/12/2019 1029   APPEARANCEUR HAZY (A) 05/24/2019 1029   LABSPEC 1.010 05/21/2019 1029   PHURINE 6.0 05/13/2019 1029   GLUCOSEU NEGATIVE 04/30/2019 1029   HGBUR LARGE (A) 05/07/2019 1029   BILIRUBINUR NEGATIVE 05/07/2019 [place] 05/25/2019 1029   PROTEINUR 100 (A) 05/06/2019 1029   NITRITE NEGATIVE 04/25/2019 1029   LEUKOCYTESUR NEGATIVE 05/07/2019 1029      Component Value Date/Time   PHART 7.480 (H)  05/24/2019 2326   PCO2ART 39.9 05/04/2019 2326   PO2ART 124.0 (H) 05/11/2019 2326   HCO3 29.7 (H) 05/24/2019 2326   TCO2 31 05/15/2019 2326   ACIDBASEDEF 6.0 (H) 04/26/2019 1636   O2SAT 66.9 05/19/2019 0455    No results found for: IRON, TIBC, FERRITIN, IRONPCTSAT     ASSESSMENT/PLAN:     1.  Baseline serum creatinine 1.34 on admission.  2.  Acute kidney injury.  Likely secondary to decreased cardiac output in the setting of congestive heart failure.  He may have had some component of contrast-induced nephropathy.    Urine output is excellent. Continue current management with diuretics.  Creatinine is up trending.  We will need to watch closely.  Urine clear after changed to Impella 5.0.  No urgent indication for dialysis.  3.  Coronary artery disease.  Triple-vessel disease noted on cardiac catheterization.  For possible CABG.  4.  Acute hypoxemic respiratory  failure.  Secondary to congestive heart failure.    Looks relatively euvolemic.Marland Kitchen  Being covered for possible pneumonia as well.  5.  Acute systolic congestive heart failure.  Ejection fraction less than 20%.  Continue balloon pump support.  Agree with holding furosemide for now as he does not clinically appear to be markedly fluid overloaded.  ACE inhibitor on hold in the setting of acute kidney injury.  Swan-Ganz placed earlier this afternoon for monitoring of hemodynamics.    [place], DO, [place]

## 2019-05-19 NOTE — Progress Notes (Signed)
1 Day Post-Op Procedure(s) (LRB): INSERTION OF IMPELLA 5.0 IMPLANTABLE LEFT VENTRICULAR ASSIST DEVICE THROUGH RIGHT AXILLARY; REMOVAL OF RIGHT GROIN IMPELLA CP (N/A) right femoral artery repair and left heart catheterization Subjective: Intubated; sedated  Objective: Vital signs in last 24 hours: Temp:  [98.4 F (36.9 C)-99.9 F (37.7 C)] 99 F (37.2 C) (10/25 0630) Pulse Rate:  [94-106] 96 (10/25 0449) Cardiac Rhythm: Normal sinus rhythm;Sinus tachycardia (10/25 0600) Resp:  [0-30] 17 (10/25 0630) BP: (74-134)/(45-100) 96/81 (10/25 0600) SpO2:  [91 %-100 %] 100 % (10/25 0630) Arterial Line BP: (77-126)/(54-89) 83/73 (10/25 0630) FiO2 (%):  [40 %] 40 % (10/25 0449) Weight:  [71.2 kg-71.5 kg] 71.5 kg (10/25 0530)  Hemodynamic parameters for last 24 hours: PAP: (60-70)/(22-26) 61/22 CVP:  [1 mmHg-11 mmHg] 6 mmHg CO:  [3.2 L/min-3.4 L/min] 3.2 L/min CI:  [1.8 L/min/m2] 1.8 L/min/m2  Intake/Output from previous day: 10/24 0701 - 10/25 0700 In: 3927.4 [I.V.:2232.2; NG/GT:600; IV Piggyback:829.9] Out: 2535 [Urine:2235; Blood:300] Intake/Output this shift: Total I/O In: 1847.4 [I.V.:970.9; Other:146.5; IV Piggyback:730] Out: 4332 [Urine:870; Blood:300]  General appearance: sedated, and ventilated Neurologic: unable to fully assess Heart: irregular rhythm Lungs: rales posterior - bilateral Abdomen: soft, non-tender; bowel sounds normal; no masses,  no organomegaly Extremities: small right axillary hematoma; warm right foot w/palpable pulse Wound: c/d/i  Lab Results: Recent Labs    05/15/2019 2127 04/30/2019 2326 05/19/19 0434  WBC 22.9*  --  23.0*  HGB 11.7* 10.9* 9.5*  HCT 33.3* 32.0* 27.8*  PLT 120*  --  89*   BMET:  Recent Labs    05/02/2019 2127 04/28/2019 2326 05/19/19 0434  NA 137 136 138  K 3.4* 3.5 3.6  CL 98  --  102  CO2 25  --  25  GLUCOSE 148*  --  168*  BUN 51*  --  51*  CREATININE 3.43*  --  3.49*  CALCIUM 8.4*  --  7.8*    PT/INR:  Recent Labs   05/21/2019 2127  LABPROT 17.7*  INR 1.5*   ABG    Component Value Date/Time   PHART 7.480 (H) 05/14/2019 2326   HCO3 29.7 (H) 05/11/2019 2326   TCO2 31 05/12/2019 2326   ACIDBASEDEF 6.0 (H) 04/29/2019 1636   O2SAT 66.9 05/19/2019 0455   CBG (last 3)  Recent Labs    05/14/2019 2057 05/22/2019 2325 05/19/19 0436  GLUCAP 140* 166* 159*    Assessment/Plan: S/P Procedure(s) (LRB): INSERTION OF IMPELLA 5.0 IMPLANTABLE LEFT VENTRICULAR ASSIST DEVICE THROUGH RIGHT AXILLARY; REMOVAL OF RIGHT GROIN IMPELLA CP (N/A) right femoral artery repair and left heart catheterization Try to wean to extubate as mental status allows   LOS: 3 days    Raymond Chavez 05/19/2019

## 2019-05-19 NOTE — Progress Notes (Signed)
Pharmacist Heart Failure Core Measure Documentation  Assessment: Raymond Chavez has an EF documented as 20% on 04/26/2019 by echo.  Rationale: Heart failure patients with left ventricular systolic dysfunction (LVSD) and an EF < 40% should be prescribed an angiotensin converting enzyme inhibitor (ACEI) or angiotensin receptor blocker (ARB) at discharge unless a contraindication is documented in the medical record.  This patient is not currently on an ACEI or ARB for HF.  This note is being placed in the record in order to provide documentation that a contraindication to the use of these agents is present for this encounter.  ACE Inhibitor or Angiotensin Receptor Blocker is contraindicated (specify all that apply)  []   ACEI allergy AND ARB allergy []   Angioedema []   Moderate or severe aortic stenosis []   Hyperkalemia [x]   Hypotension []   Renal artery stenosis [x]   Worsening renal function, preexisting renal disease or dysfunction   Erin Hearing PharmD., BCPS Clinical Pharmacist 05/19/2019 2:56 PM

## 2019-05-19 NOTE — CV Procedure (Signed)
Procedure: Swan-ganz catheter insertion  Indication: Cardiogenic shock  Emergent consent implied.  An appropriate timeout was taken prior to the procedure.   The right neck was prepped and draped in the routine sterile fashion and anesthetized with 1% local lidocaine. A 7 FR venous sheath was placed in the right internal jugular vein using a modified Seldinger technique. A standard Swan-Ganz catheter was used for the procedure. The distal tip of the PA cath was maneuvered into the pulmonary artery using pressure waveform guidance. The catheter was sutured into place.   Glori Bickers, MD  2:04 PM

## 2019-05-19 NOTE — Anesthesia Postprocedure Evaluation (Signed)
Anesthesia Post Note  Patient: Raymond Chavez  Procedure(s) Performed: INSERTION OF IMPELLA 5.0 IMPLANTABLE LEFT VENTRICULAR ASSIST DEVICE THROUGH RIGHT AXILLARY; REMOVAL OF RIGHT GROIN IMPELLA CP (N/A Chest) right femoral artery repair and left heart catheterization     Patient location during evaluation: SICU Anesthesia Type: General Level of consciousness: sedated Pain management: pain level controlled Vital Signs Assessment: post-procedure vital signs reviewed and stable Respiratory status: patient remains intubated per anesthesia plan Cardiovascular status: stable Postop Assessment: no apparent nausea or vomiting Anesthetic complications: no    Last Vitals:  Vitals:   05/19/19 0130 05/19/19 0145  BP: (!) 83/73   Pulse: (!) 104 94  Resp: 18   Temp: 37 C 37 C  SpO2: 99%     Last Pain:  Vitals:   05/19/19 0130  TempSrc: Bladder  PainSc:                  Tiajuana Amass

## 2019-05-19 NOTE — Plan of Care (Signed)
  Problem: Clinical Measurements: Goal: Will remain free from infection Outcome: Progressing Goal: Diagnostic test results will improve Outcome: Progressing   Problem: Nutrition: Goal: Adequate nutrition will be maintained Outcome: Progressing   Problem: Coping: Goal: Level of anxiety will decrease Outcome: Progressing   Problem: Elimination: Goal: Will not experience complications related to bowel motility Outcome: Progressing Goal: Will not experience complications related to urinary retention Outcome: Progressing   Problem: Pain Managment: Goal: General experience of comfort will improve Outcome: Progressing   Problem: Safety: Goal: Ability to remain free from injury will improve Outcome: Progressing   Problem: Coping: Goal: Level of anxiety will decrease Outcome: Progressing   Problem: Fluid Volume: Goal: Risk for excess fluid volume will decrease Outcome: Progressing   Problem: Clinical Measurements: Goal: Will remain free from infection Outcome: Progressing   Problem: Respiratory: Goal: Will regain and/or maintain adequate ventilation Outcome: Progressing

## 2019-05-19 NOTE — Plan of Care (Signed)
  Problem: Clinical Measurements: Goal: Diagnostic test results will improve Outcome: Progressing Goal: Respiratory complications will improve Outcome: Progressing   Problem: Nutrition: Goal: Adequate nutrition will be maintained Outcome: Progressing   Problem: Pain Managment: Goal: General experience of comfort will improve Outcome: Progressing   Problem: Safety: Goal: Ability to remain free from injury will improve Outcome: Progressing   Problem: Skin Integrity: Goal: Risk for impaired skin integrity will decrease Outcome: Progressing   Problem: Fluid Volume: Goal: Risk for excess fluid volume will decrease Outcome: Progressing   Problem: Elimination: Goal: Will not experience complications related to bowel motility Outcome: Not Progressing   Problem: Education: Goal: Knowledge of General Education information will improve Description: Including pain rating scale, medication(s)/side effects and non-pharmacologic comfort measures Outcome: Not Applicable   Problem: Health Behavior/Discharge Planning: Goal: Ability to manage health-related needs will improve Outcome: Not Applicable   Problem: Activity: Goal: Risk for activity intolerance will decrease Outcome: Not Applicable   Problem: Education: Goal: Knowledge of the prescribed therapeutic regimen will improve Outcome: Not Applicable   Problem: Activity: Goal: Risk for activity intolerance will decrease Outcome: Not Applicable   Problem: Cardiac: Goal: Ability to maintain an adequate cardiac output will improve Outcome: Not Applicable

## 2019-05-19 NOTE — Progress Notes (Signed)
Advanced Heart Failure Rounding Note   Subjective:    Intubated/sedated.   Underwent placement of Impella 5.0 on 10/24 with removal of R femoral Impella CP.   Remains intubated/sedated. On  NE 13 and milrinone. 0.25. Co-ox 67%. Creatinine plateauing at 3.4  Swan dislodged last night during transport.   PCT 3.64   Impella P-9 No flows due to damage of sensor during placement. Placement signal looks good. Position ok on echo    Objective:   Weight Range:  Vital Signs:   Temp:  [98.4 F (36.9 C)-99.5 F (37.5 C)] 98.8 F (37.1 C) (10/25 1230) Pulse Rate:  [94-106] 97 (10/25 1153) Resp:  [0-24] 14 (10/25 1230) BP: (74-134)/(45-100) 97/73 (10/25 1200) SpO2:  [91 %-100 %] 100 % (10/25 1230) Arterial Line BP: (76-126)/(58-89) 82/72 (10/25 1230) FiO2 (%):  [40 %] 40 % (10/25 1200) Weight:  [71.5 kg] 71.5 kg (10/25 0530) Last BM Date: (PTA)  Weight change: Filed Weights   05/21/2019 0545 05/02/2019 0658 05/19/19 0530  Weight: 70.5 kg 71.2 kg 71.5 kg    Intake/Output:   Intake/Output Summary (Last 24 hours) at 05/19/2019 1235 Last data filed at 05/19/2019 1200 Gross per 24 hour  Intake 3971.83 ml  Output 2195 ml  Net 1776.83 ml     Physical Exam: General:  Intubated/sedated HEENT: +ETT Neck: supple. no JVD. Carotids 2+ bilat; no bruits. No lymphadenopathy or thryomegaly appreciated. Cor: Impella site in left chest with small hematoma PMI nondisplaced. Regular rate & rhythm. No rubs, gallops or murmurs. Lungs: clear Abdomen: soft, nontender, nondistended. No hepatosplenomegaly. No bruits or masses. Good bowel sounds. Extremities: no cyanosis, clubbing, rash, edema R groin site wound ok. + venous sheath  Neuro: intubated/sedated  Telemetry: Sinus 90-100s with frequent PVCs Personally reviewed   Labs: Basic Metabolic Panel: Recent Labs  Lab 05/15/2019 1504 05/14/2019 1600  05/03/2019 1913  05/24/2019 0350 05/02/2019 1200  05/10/2019 1610  05/12/2019 1737  05/25/2019 1958 04/28/2019 2001 05/24/2019 2127 05/07/2019 2326 05/19/19 0434  NA  --  134*   < > 139   < > 137 138   < >  --    < > 139 138 138 137 136 138  K  --  5.4*   < > 4.0   < > 4.3 3.6   < >  --    < > 3.4* 3.5 3.5 3.4* 3.5 3.6  CL  --  105  --  101  --  98 97*  --   --   --  96*  --  97* 98  --  102  CO2  --  17*  --  23  --  27 27  --   --   --   --   --   --  25  --  25  GLUCOSE  --  162*  --  162*  --  153* 147*  --   --   --  131*  --  135* 148*  --  168*  BUN  --  34*  --  37*  --  43* 48*  --   --   --  50*  --  46* 51*  --  51*  CREATININE  --  2.48*  --  2.49*  --  2.79* 3.05*  --   --   --  3.00*  --  3.20* 3.43*  --  3.49*  CALCIUM  --  7.9*  --  9.2  --  8.4*  8.4*  --   --   --   --   --   --  8.4*  --  7.8*  MG 2.0 2.0  --   --   --  2.5*  --   --  2.3  --   --   --   --   --   --  2.0  PHOS 5.5* 5.8*  --   --   --  4.0  --   --  3.8  --   --   --   --   --   --  3.7   < > = values in this interval not displayed.    Liver Function Tests: Recent Labs  Lab 05/03/2019 0424 05/12/2019 0350 04/25/2019 1200 05/22/2019 2127 05/19/19 0434  AST 70* 128* 130* 118* 64*  ALT 40 '25 30 24 22  ' ALKPHOS 86 63 63 61 53  BILITOT 1.8* 3.3* 2.7* 1.8* 1.1  PROT 6.7 5.5* 5.7* 5.3* 4.8*  ALBUMIN 3.2* 2.7* 2.7* 2.5* 2.2*   No results for input(s): LIPASE, AMYLASE in the last 168 hours. No results for input(s): AMMONIA in the last 168 hours.  CBC: Recent Labs  Lab 05/19/2019 1834  05/15/2019 0900  05/15/2019 1504  05/01/2019 0350  05/07/2019 1958 04/26/2019 2001 05/05/2019 2127 05/11/2019 2326 05/19/19 0434  WBC 19.6*   < > 20.8*  --  15.9*  --  19.0*  --   --   --  22.9*  --  23.0*  NEUTROABS 17.1*  --  17.8*  --  13.3*  --   --   --   --   --  17.4*  --   --   HGB 17.2*   < > 16.3   < > 14.7   < > 13.2   < > 11.2* 11.9* 11.7* 10.9* 9.5*  HCT 53.3*   < > 51.1   < > 45.8   < > 38.6*   < > 33.0* 35.0* 33.3* 32.0* 27.8*  MCV 94.3   < > 93.1  --  94.2  --  88.9  --   --   --  87.4  --  88.8  PLT 213    < > 204  --  149*  --  139*  --   --   --  120*  --  89*   < > = values in this interval not displayed.    Cardiac Enzymes: No results for input(s): CKTOTAL, CKMB, CKMBINDEX, TROPONINI in the last 168 hours.  BNP: BNP (last 3 results) Recent Labs    05/07/2019 1739 05/03/2019 1234  BNP 2,725.3* 3,370.0*    ProBNP (last 3 results) No results for input(s): PROBNP in the last 8760 hours.    Other results:  Imaging: Dg Chest 1 View  Result Date: 05/21/2019 CLINICAL DATA:  Impella insertion EXAM: DG C-ARM 1-60 MIN; CHEST  1 VIEW COMPARISON:  Same-day chest radiographs FINDINGS: Intraoperative fluoroscopy demonstrates the presence of a transesophageal echo probe and placement of an Impella left ventricular assist device in expected positioning. Cardiomediastinal contours are similar to comparison exams. Evaluation of the lungs is limited these collimated fluoroscopic views. IMPRESSION: Fluoroscopy demonstrates presence of a transesophageal echo probe and Impella device in expected position. Electronically Signed   By: Lovena Le M.D.   On: 05/12/2019 22:25   Dg Chest 1 View  Result Date: 05/04/2019 CLINICAL DATA:  Impella insertion EXAM: CHEST  1 VIEW COMPARISON:  Same day radiographs FINDINGS:  Stable satisfactory positioning of a transesophageal tube, endotracheal tube, and right upper extremity PICC. Interval placement of an Impella left ventricular assist device via the right axillary artery. Streaky atelectatic opacities are present in the lungs. Lungs are otherwise clear. Cardiomediastinal contours are stable. Atherosclerotic calcification of the aorta is noted. IMPRESSION: 1. Interval placement of an Impella left ventricular assist device in expected position. 2. Stable satisfactory positioning of a transesophageal tube, endotracheal tube, and right upper extremity PICC. 3. Bibasilar atelectasis with improving opacity in the lung bases. Electronically Signed   By: Lovena Le M.D.    On: 05/05/2019 22:23   Dg Chest Port 1 View  Result Date: 04/26/2019 CLINICAL DATA:  Respiratory failure EXAM: PORTABLE CHEST 1 VIEW COMPARISON:  Chest radiograph from one day prior. FINDINGS: Inferior approach Impella device terminates over the left ventricular apex. Endotracheal tube tip is 4.0 cm above the carina. Enteric tube enters stomach with the tip not seen on this image. Right PICC terminates over lower third of the SVC. Inferior approach Swan-Ganz catheter terminates over right infrahilar region. Stable cardiomediastinal silhouette with normal heart size. No pneumothorax. No pleural effusion. Lungs appear clear, with no acute consolidative airspace disease and no pulmonary edema. IMPRESSION: 1. Well-positioned support structures.  No pneumothorax. 2. No pulmonary edema. Electronically Signed   By: Ilona Sorrel M.D.   On: 05/10/2019 12:27   Dg C-arm 1-60 Min  Result Date: 05/07/2019 CLINICAL DATA:  Impella insertion EXAM: DG C-ARM 1-60 MIN; CHEST  1 VIEW COMPARISON:  Same-day chest radiographs FINDINGS: Intraoperative fluoroscopy demonstrates the presence of a transesophageal echo probe and placement of an Impella left ventricular assist device in expected positioning. Cardiomediastinal contours are similar to comparison exams. Evaluation of the lungs is limited these collimated fluoroscopic views. IMPRESSION: Fluoroscopy demonstrates presence of a transesophageal echo probe and Impella device in expected position. Electronically Signed   By: Lovena Le M.D.   On: 05/14/2019 22:25     Medications:     Scheduled Medications:  aspirin  81 mg Per Tube Daily   atorvastatin  80 mg Per Tube q1800   chlorhexidine gluconate (MEDLINE KIT)  15 mL Mouth Rinse BID   Chlorhexidine Gluconate Cloth  6 each Topical Daily   insulin aspart  1-3 Units Subcutaneous Q4H   mouth rinse  15 mL Mouth Rinse 10 times per day   pantoprazole (PROTONIX) IV  40 mg Intravenous QHS   potassium chloride   20 mEq Oral Once   sodium chloride flush  10-40 mL Intracatheter Q12H   sodium chloride flush  3 mL Intravenous Q12H   vancomycin variable dose per unstable renal function (pharmacist dosing)   Does not apply See admin instructions    Infusions:  sodium chloride 10 mL/hr at 05/03/2019 1600   sodium chloride Stopped (05/14/2019 0942)   sodium chloride     ceFEPime (MAXIPIME) IV Stopped (05/19/19 0915)   feeding supplement (VITAL AF 1.2 CAL) 1,000 mL (05/19/19 0931)   fentaNYL infusion INTRAVENOUS 100 mcg/hr (05/19/19 1200)   impella catheter heparin 50 unit/mL in dextrose 5%     heparin Stopped (05/10/2019 1642)   midazolam 3 mg/hr (05/19/19 1200)   milrinone 0.25 mcg/kg/min (05/19/19 1200)   norepinephrine (LEVOPHED) Adult infusion 13 mcg/min (05/19/19 0853)    PRN Medications: sodium chloride, Place/Maintain arterial line **AND** sodium chloride, docusate, fentaNYL, fentaNYL (SUBLIMAZE) injection, fentaNYL (SUBLIMAZE) injection, midazolam, midazolam, sodium chloride flush   Assessment/Plan:   1. Mixed shock picture - Septic /Cardiogenic - Now  primarily cardiogenic - Cath 10/23 with severe 3v CAD. LM 75%, LAD 100%, LCX 80%, RCA 80% prox - Echo EF 20% RV moderately HK - Impella CP placed 10/23. Switch for Impella 5.0 on 10/24 - No flow meter on Impella due to damage to sensor on insertion. Position ok on echo.  - On milrinone 0.25 NE 13. Co-ox 65%. Will replace swan today and adjust inotropes as needed - Lactate has cleared - PCT also markedly elevated concerning for PNA - continue vanc/cefepime    2. NSTEMI/CAD - hstrop 16k - Cath 10/23 with severe 3v CAD. LM 75%, LAD 100%, LCX 80%, RCA 80% prox - continue ASA/statin. No b-blocker with shock - CABG with Impella 5.0 support likely best option when able  3. AKI - baseline creatinine 1.3. likely due to ATN from shock. - Creatinine seems to be leveling out at 3.4 - still making urine   4 Acute Respiratory Failure   - remains intubated. - possibly wean soon  - D/w CCM at bedside  5. PNA  - PCT 6.85-> 3.64 - Antibiotics per CCM (vanc/cefepime) - CX NGTD  6. LBBB  CRITICAL CARE Performed by: Glori Bickers  Total critical care time: 45 minutes  Critical care time was exclusive of separately billable procedures and treating other patients.  Critical care was necessary to treat or prevent imminent or life-threatening deterioration.  Critical care was time spent personally by me (independent of midlevel providers or residents) on the following activities: development of treatment plan with patient and/or surrogate as well as nursing, discussions with consultants, evaluation of patient's response to treatment, examination of patient, obtaining history from patient or surrogate, ordering and performing treatments and interventions, ordering and review of laboratory studies, ordering and review of radiographic studies, pulse oximetry and re-evaluation of patient's condition.  Length of Stay: 3   Glori Bickers MD 05/19/2019, 12:35 PM  Advanced Heart Failure Team Pager [phone] (M-F; 7a - 4p)  Please contact [facility] for night-coverage after hours (4p -7a ) and weekends on [email or web address]

## 2019-05-19 NOTE — Progress Notes (Signed)
Pt had episode of sustained Vtach with flat lined aline for 20secs/ no pulse. Code blue called, yet pt came out of rhythm without any intervention. Dr Winfield Cunas (cardiology) called. MD at bedside. Orders given to decreased P level to 8 on impella and redraw COOX. CVP 6, 250ML NS boLus given. Will continue to monitor pt closely.

## 2019-05-19 NOTE — Progress Notes (Signed)
NAME:  Raymond Chavez, MRN:  [id number], DOB:  [birth year], LOS: 3 ADMISSION DATE:  05/03/2019, CONSULTATION DATE:  05/12/2019 REFERRING MD:  Dalene Seltzer  CHIEF COMPLAINT:  SOB   Brief History   Raymond Chavez is a 68 y.o. male who was admitted with acute hypoxic respiratory failure due to acute pulmonary edema, PNA, possible ACS / NSTEMI.  He required intubation in the ED after failing BiPAP.  History of present illness    Raymond Chavez is a 68 y.o. male who has a PMH including but not limited to HTN, CAD.  He presented to [facility] ED 10/22 with dyspnea and left sided chest pain.  He was placed on BiPAP given his increased WOB; however, he failed and required intubation. CTA chest which was negative for PE but did demonstrate bilateral basilar PNA, acute pulmonary edema, hilar adenopathy.  Labs noteable for BNP 2,725, trop 168 > 712, WBC 20.  EKG with LBBB (no baseline to compare). Started on heparin gtt for empiric ACS / NSTEMI as well as vanc / cefepime for PNA.  Cardiology was consulted by EDP and PCCM called for admission.   Past Medical History  HTN, CAD.  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 Echo 10/23 >   Micro Data:  Blood 10/22 >>> Sputum 10/22 >>>  Urine 10/22 >>> Urine Strep > Negative  RVP 10/22 > Neg MRSA PCR > Neg SARS CoV2 10/22 > neg.  Antimicrobials:  Vanc 10/22 > 10/24  Cefepime 10/22 >    Interim history/subjective:    Objective:  Blood pressure 90/80, pulse 100, temperature 99.1 F (37.3 C), temperature source Bladder, resp. rate 17,  height 5\' 7"  (1.702 m), weight 71.5 kg, SpO2 100 %. PAP: (60-70)/(22-25) 61/22 CVP:  [1 mmHg-10 mmHg] 5 mmHg CO:  [3.2 L/min] 3.2 L/min CI:  [1.8 L/min/m2] 1.8 L/min/m2  Vent Mode: PRVC FiO2 (%):  [40 %] 40 % Set Rate:  [18 bmp] 18 bmp Vt Set:  [520 mL] 520 mL PEEP:  [5 cmH20] 5 cmH20 Plateau Pressure:  [11 cmH20-16 cmH20] 14 cmH20   Intake/Output Summary (Last 24 hours) at 05/19/2019 0852 Last data filed at 05/19/2019 0800 Gross per 24 hour  Intake 4034.91 ml  Output 2545 ml  Net 1489.91 ml   Filed Weights   05/01/2019 0545 04/27/2019 0658 05/19/19 0530  Weight: 70.5 kg 71.2 kg 71.5 kg    Examination: General: Adult male, on vent, no distress  Neuro: Sedated, does not follow commands, pupils intact  HEENT: [place]/AT. Sclerae anicteric.  ETT in place. Cardiovascular: RRR, no M/R/G, impella in right axillary   Lungs: Crackles to bases, no wheeze Abdomen: active bowel sounds, soft, non-tender  Musculoskeletal: -edema  Skin: Intact, feet cool, no rashes.  Assessment & Plan:   Acute hypoxic respiratory failure requiring intubation - multifactorial due to PNA, acute pulmonary edema (s/p 60mg  lasix in ED) in setting of Cardiogenic Shock  Plan  - Vent support > Wean as able with Daily SBT. 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 - Milrinone per cards. - Continue Impella and Swan >> concerns with hemolysis, trend LDH - Continue preadmission ASA. - Hold preadmission amlodipine, atorvastatin, HCTZ, lisinopril.  Acute Kidney Injury  -Creatinine continues to rise however patient with good  output  Hyponatremia - presumed hypervolemic in setting underlying CHF. >>> Improved  NAGMA. >> Improved  Plan - Nephrology Following - Trend BMP  - Responding to Lasix pushes  - Strict I/O's.  Best Practice:  Diet: NPO. Pain/Anxiety/Delirium protocol: Fentanyl gtt / Versed   PRN.  RASS goal 0 to -1. VAP protocol DVT prophylaxis: SCD's / Heparin gtt. GI prophylaxis: PPI. Glucose control: SSI. Mobility: Bedrest. Code Status: Full. Family Communication: Will call daughter Carmelia Bake listed in system). Disposition: ICU.  Labs   CBC: Recent Labs  Lab 05/02/2019 1834  05/20/2019 0900  05/12/2019 1504  04/25/2019 0350  05/02/2019 1958 05/20/2019 2001 05/05/2019 2127 05/07/2019 2326 05/19/19 0434  WBC 19.6*   < > 20.8*  --  15.9*  --  19.0*  --   --   --  22.9*  --  23.0*  NEUTROABS 17.1*  --  17.8*  --  13.3*  --   --   --   --   --  17.4*  --   --   HGB 17.2*   < > 16.3   < > 14.7   < > 13.2   < > 11.2* 11.9* 11.7* 10.9* 9.5*  HCT 53.3*   < > 51.1   < > 45.8   < > 38.6*   < > 33.0* 35.0* 33.3* 32.0* 27.8*  MCV 94.3   < > 93.1  --  94.2  --  88.9  --   --   --  87.4  --  88.8  PLT 213   < > 204  --  149*  --  139*  --   --   --  120*  --  89*   < > = values in this interval not displayed.   Basic Metabolic Panel: Recent Labs  Lab 05/06/2019 1504 05/02/2019 1600  05/20/2019 1913  05/12/2019 0350 04/26/2019 1200  05/03/2019 1610  05/21/2019 1737 05/05/2019 1958 05/20/2019 2001 05/25/2019 2127 05/22/2019 2326 05/19/19 0434  NA  --  134*   < > 139   < > 137 138   < >  --    < > 139 138 138 137 136 138  K  --  5.4*   < > 4.0   < > 4.3 3.6   < >  --    < > 3.4* 3.5 3.5 3.4* 3.5 3.6  CL  --  105  --  101  --  98 97*  --   --   --  96*  --  97* 98  --  102  CO2  --  17*  --  23  --  27 27  --   --   --   --   --   --  25  --  25  GLUCOSE  --  162*  --  162*  --  153* 147*  --   --   --  131*  --  135* 148*  --  168*  BUN  --  34*  --  37*  --  43* 48*  --   --   --  50*  --  46* 51*  --  51*   CREATININE  --  2.48*  --  2.49*  --  2.79* 3.05*  --   --   --  3.00*  --  3.20* 3.43*  --  3.49*  CALCIUM  --  7.9*  --  9.2  --  8.4* 8.4*  --   --   --   --   --   --  8.4*  --  7.8*  MG 2.0 2.0  --   --   --  2.5*  --   --  2.3  --   --   --   --   --   --  2.0  PHOS 5.5* 5.8*  --   --   --  4.0  --   --  3.8  --   --   --   --   --   --  3.7   < > = values in this interval not displayed.   GFR: Estimated Creatinine Clearance: 18.7 mL/min (A) (by C-G formula based on SCr of 3.49 mg/dL (H)). Recent Labs  Lab 05/12/2019 0900  05/08/2019 1504 05/05/2019 1752 05/09/2019 0350 05/09/2019 0448 05/02/2019 1200 05/03/2019 2127 04/26/2019 2327 05/19/19 0434  PROCALCITON 5.55  --   --   --  6.85  --   --   --   --  3.64  WBC 20.8*  --  15.9*  --  19.0*  --   --  22.9*  --  23.0*  LATICACIDVEN  --    < >  --  2.1*  --  2.1* 1.4  --  1.6  --    < > = values in this interval not displayed.   Liver Function Tests: Recent Labs  Lab 05/11/2019 0424 05/06/2019 0350 05/22/2019 1200 05/14/2019 2127 05/19/19 0434  AST 70* 128* 130* 118* 64*  ALT 40 25 30 24 22   ALKPHOS 86 63 63 61 53  BILITOT 1.8* 3.3* 2.7* 1.8* 1.1  PROT 6.7 5.5* 5.7* 5.3* 4.8*  ALBUMIN 3.2* 2.7* 2.7* 2.5* 2.2*   No results for input(s): LIPASE, AMYLASE in the last 168 hours. No results for input(s): AMMONIA in the last 168 hours. ABG    Component Value Date/Time   PHART 7.480 (H) 05/21/2019 2326   PCO2ART 39.9 05/03/2019 2326   PO2ART 124.0 (H) 05/06/2019 2326   HCO3 29.7 (H) 05/10/2019 2326   TCO2 31 05/14/2019 2326   ACIDBASEDEF 6.0 (H) 05/11/2019 1636   O2SAT 66.9 05/19/2019 0455    Coagulation Profile: Recent Labs  Lab 04/25/2019 0022 05/20/2019 2127  INR 1.7* 1.5*   Cardiac Enzymes: No results for input(s): CKTOTAL, CKMB, CKMBINDEX, TROPONINI in the last 168 hours. HbA1C: Hgb A1c MFr Bld  Date/Time Value Ref Range Status  05/19/2019 11:25 PM 5.5 4.8 - 5.6 % Final    Comment:    (NOTE) Pre diabetes:          5.7%-6.4%  Diabetes:              >6.4% Glycemic control for   <7.0% adults with diabetes    CBG: Recent Labs  Lab 05/10/2019 1606 05/13/2019 2057 05/23/2019 2325 05/19/19 0436 05/19/19 0831  GLUCAP 157* 140* 166* 159* 156*    Review of Systems:   Unable to obtain as pt is encephalopathic.  Past medical history  He,  has a past medical history of Acute respiratory failure with hypoxemia (HCC) (04/2019), Carotid artery occlusion, and Hypertension.   Surgical History    Past Surgical History:  Procedure Laterality Date  . RIGHT/LEFT HEART CATH AND CORONARY ANGIOGRAPHY N/A 2019/07/06   Procedure: RIGHT/LEFT HEART CATH AND CORONARY ANGIOGRAPHY;  Surgeon: Laurey MoraleMcLean, Dalton S, MD;  [facility];  Service: Cardiovascular;  Laterality: N/A;  . VENTRICULAR ASSIST DEVICE INSERTION N/A 2019/07/06   Procedure: VENTRICULAR ASSIST DEVICE INSERTION;  Surgeon: Laurey MoraleMcLean, Dalton S, MD;  [facility];  Service: Cardiovascular;  Laterality: N/A;     Social History   reports that he has been smoking cigarettes. He has been smoking about 0.00 packs per day for the past 40.00 years. He has never used smokeless tobacco. He reports current alcohol use of about 6.0 - 12.0 standard drinks of alcohol per week. He reports that he does not use drugs.   Family history   His family history is not on file.   Allergies No Known Allergies   Home meds  Prior to Admission medications   Medication Sig Start Date End Date Taking? Authorizing Provider  albuterol (PROAIR HFA) 108 (90 Base) MCG/ACT inhaler Inhale 2 puffs into the lungs every 4 (four) hours as needed for wheezing or shortness of breath.    Yes [provider]  amLODipine (NORVASC) 5 MG tablet Take 5 mg by mouth daily. 12/04/17  Yes [provider]  aspirin EC 81 MG tablet Take 81 mg by mouth daily.   Yes [provider]  atorvastatin (LIPITOR) 40 MG tablet Take 40 mg by mouth at bedtime. 03/06/19  Yes  [provider]  Azelastine HCl 137 MCG/SPRAY SOLN Place 1 spray into both nostrils 2 (two) times daily as needed (for allergic symptoms or rhinitis).  03/28/19  Yes [provider]  predniSONE (DELTASONE) 20 MG tablet Take 20-60 mg by mouth See admin instructions. Take 60 mg by mouth once a day for 3 days, 40 mg once a day for 3 days, then 20 mg once a day for 3 days 05/15/19  Yes [provider]  SPIRIVA RESPIMAT 1.25 MCG/ACT AERS Inhale 2 puffs into the lungs daily. 04/23/19  Yes [provider]  hydrochlorothiazide (HYDRODIURIL) 12.5 MG tablet Take 12.5 mg by mouth daily.    [provider]  lisinopril (PRINIVIL,ZESTRIL) 20 MG tablet Take 1 tablet (20 mg total) by mouth daily. Patient not taking: Reported on 05/04/2019 11/28/13   Graylon GoodBaker, Zachary H, PA-C    Critical care time: 42 min.   Jovita KussmaulKatalina Gerardine Peltz, AGACNP-BC [facility]  PCCM Pgr: [phone]

## 2019-05-19 NOTE — Progress Notes (Signed)
ANTICOAGULATION CONSULT NOTE - Follow Up Consult  Pharmacy Consult for Heparin  Indication: Impella  No Known Allergies  Patient Measurements: Height: 5\' 7"  (170.2 cm)(measured x3) Weight: 157 lb 10.1 oz (71.5 kg) IBW/kg (Calculated) : 66.1  Vital Signs: Temp: 99 F (37.2 C) (10/25 0700) Temp Source: Bladder (10/25 0130) BP: 95/74 (10/25 0700) Pulse Rate: 96 (10/25 0449)  Labs: Recent Labs    05/05/2019 2055 05/15/2019 2325  05/19/2019 0504  05/15/2019 0350 05/20/2019 0531  05/23/2019 2001 05/02/2019 2127 04/26/2019 2326 05/19/19 0434  HGB  --   --    < >  --    < > 13.2  --    < > 11.9* 11.7* 10.9* 9.5*  HCT  --   --    < >  --    < > 38.6*  --    < > 35.0* 33.3* 32.0* 27.8*  PLT  --   --    < >  --    < > 139*  --   --   --  120*  --  89*  APTT  --   --   --   --   --   --   --   --   --  111*  --   --   LABPROT  --   --   --   --   --   --   --   --   --  17.7*  --   --   INR  --   --   --   --   --   --   --   --   --  1.5*  --   --   HEPARINUNFRC  --   --   --  0.17*  --  0.41  --   --   --   --   --   --   CREATININE  --   --    < >  --    < > 2.79*  --    < > 3.20* 3.43*  --  3.49*  TROPONINIHS 1,456* 4,254*  --   --   --   --  16,606*  --   --   --   --   --    < > = values in this interval not displayed.    Estimated Creatinine Clearance: 18.7 mL/min (A) (by C-G formula based on SCr of 3.49 mg/dL (H)).   Assessment: 78 yoM admitted with shock and concern for ACS now s/p Impella CP placement in cath lab. Pharmacy to assist with heparin dosing.  Impella CP swapped out for 5.0 late last night d/t hemolysis. LDH down this morning. Patient currently on heparin purge only, holding off on systemic heparin at this time. Hgb trending down 15 on admit to 9.5, pltc down from 200s to 89. If continues to trend down may need to consider HIT, no evidence of thrombosis at this time.   Goal of Therapy:  ACT 160-180 Monitor platelets by anticoagulation protocol: Yes   Plan:  Will  follow along, no ACT monitoring or systemic heparin at this time.  Erin Hearing PharmD., BCPS Clinical Pharmacist 05/19/2019 7:11 AM

## 2019-05-19 NOTE — Progress Notes (Signed)
Pharmacy Antibiotic Note  Raymond Chavez is a 68 y.o. male admitted on May 27, 2019 with chest pain and shortness of breath.  Pharmacy has been consulted for cefepime and vancomycin dosing.  Patient has been afebrile with tmax of 99.9 overnight, wbc has been elevated since admit but seeing slight trend up this morning to 23, one time dose of steroids on 10/22. Renal function has continued to worsen to 3.49 this morning. Uop of 1.3 ml/kg/hr over the past 24 hours.   Impella CP replaced with 5.0 overnight.   Renal is following, no immediate needs for dialysis with good uop, but scr worsening. Will continue current dosing of cefepime. Vancomycin random level checked this morning is within goal range, will pulse dose until patient starts renal replacement or renal function stabilizes.   Plan: Cefepime 2g IV q24 hours Vancomycin 1g now, consider repeating random level in 48 hours  Height: 5\' 7"  (170.2 cm)(measured x3) Weight: 157 lb 10.1 oz (71.5 kg) IBW/kg (Calculated) : 66.1  Temp (24hrs), Avg:98.8 F (37.1 C), Min:98.4 F (36.9 C), Max:99.7 F (37.6 C)  Recent Labs  Lab 05/07/2019 0900  05/11/2019 1503 05/19/2019 1504  04/29/2019 1752  04/27/2019 0350 04/30/2019 0448 05/04/2019 1200 05/09/2019 1737 05/25/2019 2001 05/04/2019 2127 05/13/2019 2327 05/19/19 0434  WBC 20.8*  --   --  15.9*  --   --   --  19.0*  --   --   --   --  22.9*  --  23.0*  CREATININE 2.31*   < >  --   --    < >  --    < > 2.79*  --  3.05* 3.00* 3.20* 3.43*  --  3.49*  LATICACIDVEN  --   --  2.3*  --   --  2.1*  --   --  2.1* 1.4  --   --   --  1.6  --   VANCORANDOM  --   --   --   --   --   --   --   --   --   --   --   --   --   --  17   < > = values in this interval not displayed.    Estimated Creatinine Clearance: 18.7 mL/min (A) (by C-G formula based on SCr of 3.49 mg/dL (H)).    No Known Allergies  Antimicrobials this admission: Cefepime 10/22 >>  Vancomycin 10/22 >>   Microbiology results: 10/22 BCx: ngtd 10/22  UCx: ng 10/22 Sputum: Sent 10/22 Respiratory PCR: negative 10/22 COVID: negative  Thank you for allowing pharmacy to be a part of this patient's care.  Erin Hearing PharmD., BCPS Clinical Pharmacist 05/19/2019 7:32 AM

## 2019-05-20 ENCOUNTER — Inpatient Hospital Stay (HOSPITAL_COMMUNITY): Payer: PPO

## 2019-05-20 DIAGNOSIS — J9601 Acute respiratory failure with hypoxia: Secondary | ICD-10-CM | POA: Diagnosis not present

## 2019-05-20 DIAGNOSIS — Z95811 Presence of heart assist device: Secondary | ICD-10-CM

## 2019-05-20 DIAGNOSIS — R570 Cardiogenic shock: Secondary | ICD-10-CM | POA: Diagnosis not present

## 2019-05-20 LAB — COMPREHENSIVE METABOLIC PANEL
ALT: 16 U/L (ref 0–44)
AST: 27 U/L (ref 15–41)
Albumin: 2.2 g/dL — ABNORMAL LOW (ref 3.5–5.0)
Alkaline Phosphatase: 48 U/L (ref 38–126)
Anion gap: 11 (ref 5–15)
BUN: 56 mg/dL — ABNORMAL HIGH (ref 8–23)
CO2: 22 mmol/L (ref 22–32)
Calcium: 7.5 mg/dL — ABNORMAL LOW (ref 8.9–10.3)
Chloride: 104 mmol/L (ref 98–111)
Creatinine, Ser: 3.9 mg/dL — ABNORMAL HIGH (ref 0.61–1.24)
GFR calc Af Amer: 17 mL/min — ABNORMAL LOW (ref >60)
GFR calc non Af Amer: 15 mL/min — ABNORMAL LOW (ref >60)
Glucose, Bld: 176 mg/dL — ABNORMAL HIGH (ref 70–99)
Potassium: 3.3 mmol/L — ABNORMAL LOW (ref 3.5–5.1)
Sodium: 137 mmol/L (ref 135–145)
Total Bilirubin: 1 mg/dL (ref 0.3–1.2)
Total Protein: 4.8 g/dL — ABNORMAL LOW (ref 6.5–8.1)

## 2019-05-20 LAB — POCT I-STAT EG7
Bicarbonate: 24.7 mmol/L (ref 20.0–28.0)
Calcium, Ion: 1.24 mmol/L (ref 1.15–1.40)
HCT: 35 % — ABNORMAL LOW (ref 39.0–52.0)
Hemoglobin: 11.9 g/dL — ABNORMAL LOW (ref 13.0–17.0)
O2 Saturation: 75 %
Potassium: 4.1 mmol/L (ref 3.5–5.1)
Sodium: 141 mmol/L (ref 135–145)
TCO2: 26 mmol/L (ref 22–32)
pCO2, Ven: 41.7 mmHg — ABNORMAL LOW (ref 44.0–60.0)
pH, Ven: 7.38 (ref 7.250–7.430)
pO2, Ven: 41 mmHg (ref 32.0–45.0)

## 2019-05-20 LAB — HEPARIN LEVEL (UNFRACTIONATED): Heparin Unfractionated: <0.1 IU/mL — ABNORMAL LOW (ref 0.30–0.70)

## 2019-05-20 LAB — CULTURE, RESPIRATORY W GRAM STAIN: Culture: NORMAL

## 2019-05-20 LAB — CBC
HCT: 23.4 % — ABNORMAL LOW (ref 39.0–52.0)
Hemoglobin: 7.9 g/dL — ABNORMAL LOW (ref 13.0–17.0)
MCH: 30.6 pg (ref 26.0–34.0)
MCHC: 33.8 g/dL (ref 30.0–36.0)
MCV: 90.7 fL (ref 80.0–100.0)
Platelets: 66 10*3/uL — ABNORMAL LOW (ref 150–400)
RBC: 2.58 MIL/uL — ABNORMAL LOW (ref 4.22–5.81)
RDW: 14.2 % (ref 11.5–15.5)
WBC: 22.8 10*3/uL — ABNORMAL HIGH (ref 4.0–10.5)
nRBC: 4 % — ABNORMAL HIGH (ref 0.0–0.2)

## 2019-05-20 LAB — COOXEMETRY PANEL
Carboxyhemoglobin: 1.8 % — ABNORMAL HIGH (ref 0.5–1.5)
Methemoglobin: 1.3 % (ref 0.0–1.5)
O2 Saturation: 73.3 %
Total hemoglobin: 15 g/dL (ref 12.0–16.0)

## 2019-05-20 LAB — MAGNESIUM: Magnesium: 2 mg/dL (ref 1.7–2.4)

## 2019-05-20 LAB — PHOSPHORUS: Phosphorus: 3.4 mg/dL (ref 2.5–4.6)

## 2019-05-20 LAB — GLUCOSE, CAPILLARY
Glucose-Capillary: 124 mg/dL — ABNORMAL HIGH (ref 70–99)
Glucose-Capillary: 137 mg/dL — ABNORMAL HIGH (ref 70–99)
Glucose-Capillary: 143 mg/dL — ABNORMAL HIGH (ref 70–99)
Glucose-Capillary: 157 mg/dL — ABNORMAL HIGH (ref 70–99)
Glucose-Capillary: 160 mg/dL — ABNORMAL HIGH (ref 70–99)
Glucose-Capillary: 164 mg/dL — ABNORMAL HIGH (ref 70–99)

## 2019-05-20 LAB — PREPARE RBC (CROSSMATCH)

## 2019-05-20 LAB — POCT ACTIVATED CLOTTING TIME: Activated Clotting Time: 131 seconds

## 2019-05-20 LAB — LACTATE DEHYDROGENASE: LDH: 617 U/L — ABNORMAL HIGH (ref 98–192)

## 2019-05-20 MED ORDER — VITAL AF 1.2 CAL PO LIQD
1000.0000 mL | ORAL | Status: DC
  Administered 2019-05-20: 20:00:00 1000 mL

## 2019-05-20 MED ORDER — SODIUM CHLORIDE 0.9% IV SOLUTION
Freq: Once | INTRAVENOUS | Status: AC
  Administered 2019-05-20: 11:00:00 via INTRAVENOUS

## 2019-05-20 MED ORDER — ALBUTEROL SULFATE (2.5 MG/3ML) 0.083% IN NEBU
INHALATION_SOLUTION | RESPIRATORY_TRACT | Status: AC
  Administered 2019-05-20: 2.5 mg via RESPIRATORY_TRACT
  Filled 2019-05-20: qty 3

## 2019-05-20 MED ORDER — ALBUTEROL SULFATE (2.5 MG/3ML) 0.083% IN NEBU
2.5000 mg | INHALATION_SOLUTION | RESPIRATORY_TRACT | Status: DC | PRN
  Administered 2019-05-20 – 2019-05-22 (×5): 2.5 mg via RESPIRATORY_TRACT
  Filled 2019-05-20 (×4): qty 3

## 2019-05-20 MED ORDER — POTASSIUM CHLORIDE 20 MEQ PO PACK
40.0000 meq | PACK | Freq: Once | ORAL | Status: AC
  Administered 2019-05-20: 40 meq
  Filled 2019-05-20: qty 2

## 2019-05-20 NOTE — Progress Notes (Signed)
eLink Physician-Brief Progress Note Patient Name: Raymond Chavez DOB: June 06, 1951 MRN: [id number]   Date of Service  05/20/2019  HPI/Events of Note  Review of CXR - Stable support apparatus. Improved vascular congestion/mild edema pattern. No pneumothorax.   eICU Interventions  Continue present management.      Intervention Category Major Interventions: Other:  Lysle Dingwall 05/20/2019, 9:21 PM

## 2019-05-20 NOTE — Progress Notes (Signed)
NAME:  Raymond Chavez, MRN:  [id number], DOB:  February 17, 1951, LOS: 4 ADMISSION DATE:  04/30/2019, CONSULTATION DATE:  04/27/2019 REFERRING MD:  Billy Fischer  CHIEF COMPLAINT:  SOB   Brief History   Raymond Chavez is a 68 y.o. male who was admitted with acute hypoxic respiratory failure due to acute pulmonary edema, PNA, possible ACS / NSTEMI.  He required intubation in the ED after failing BiPAP.  Past Medical History  HTN, CAD.  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   Consults:  Cardiology PCCM Nephrology   Procedures:  ETT 10/22 >  Swan 10/22 > Impella 10/23 >> 10/24 Changed to 5.0 via right axillary >>   Significant Diagnostic Tests:  CTA chest 10/22 > no PE, b/l basilar consolidation, diffuse interlobular septal thickening with GGO's, b/l hilar adenopathy. Echo 10/23 > severely decreased LV function.  Cannot estimate EF  Micro Data:  Blood 10/22, 10/24>>> Sputum 10/22 >>>  Urine 10/22 >>> Urine Strep > Negative  RVP 10/22 > Neg MRSA PCR > Neg SARS CoV2 10/22 > neg.  Antimicrobials:  Vanc 10/22 > 10/25 Cefepime 10/22 >    Interim history/subjective:  Off sedation today.  Levophed weaning down  Objective:  Blood pressure 93/67, pulse 91, temperature 98.2 F (36.8 C), temperature source Core, resp. rate 18, height 5\' 7"  (1.702 m), weight 74.5 kg, SpO2 100 %. PAP: (27-61)/(15-41) 39/21 CVP:  [4 mmHg-12 mmHg] 6 mmHg PCWP:  [10 mmHg-17 mmHg] 17 mmHg CO:  [3.3 L/min-4.1 L/min] 4.1 L/min CI:  [1.8 L/min/m2-2.3 L/min/m2] 2.3 L/min/m2  Vent Mode: PRVC FiO2 (%):  [40 %] 40 % Set Rate:  [18 bmp] 18 bmp Vt Set:  [520 mL] 520 mL PEEP:  [5 cmH20] 5 cmH20 Plateau Pressure:  [12 cmH20-16 cmH20] 13 cmH20   Intake/Output Summary (Last 24 hours) at 05/20/2019 0918 Last data filed at 05/20/2019 0900  Gross per 24 hour  Intake 3203.06 ml  Output 770 ml  Net 2433.06 ml   Filed Weights   04/29/2019 0658 05/19/19 0530 05/20/19 0440  Weight: 71.2 kg 71.5 kg 74.5 kg    Examination: Gen:      No acute distress HEENT:  EOMI, sclera anicteric Neck:     No masses; no thyromegaly, ET tube Lungs:    Clear to auscultation bilaterally; normal respiratory effort CV:         Regular rate and rhythm; no murmurs Abd:      + bowel sounds; soft, non-tender; no palpable masses, no distension Ext:    No edema; adequate peripheral perfusion Skin:      Warm and dry; no rash Neuro: Unresponsive.  Assessment & Plan:   Acute hypoxic respiratory failure requiring intubation - multifactorial due to PNA, acute pulmonary edema (s/p 60mg  lasix in ED) in setting of Cardiogenic Shock  Plan  Start pressure support weans when mental status improves. 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 LDH better today Continue preadmission ASA. Hold preadmission amlodipine, atorvastatin, HCTZ, lisinopril.  Acute Kidney Injury  -Creatinine continues to rise however patient with good output  Hyponatremia - presumed hypervolemic in setting underlying CHF. >>> Improved  NAGMA. >> Improved  Plan Creatinine continues to increase Nephrology following Continue supportive care.  No indication for hemodialysis yet as he is making good urine  Best Practice:  Diet: Increase tube feeds to goal  Pain/Anxiety/Delirium protocol: Fentanyl gtt / Versed   PRN.  RASS goal 0 to -1. VAP protocol DVT prophylaxis: SCD's /start subcu heparin if no heparin drip needed GI prophylaxis: PPI. Glucose control: SSI. Mobility: Bedrest. Code Status: Full. Family Communication: Will call daughter Raymond Chavez listed in system). Disposition: ICU.  Labs   CBC: Recent Labs  Lab 05/02/2019 1834  05/25/2019 0900  04/25/2019 1504  06/01/2019 0350  06/13/2019 2001 05/27/2019 2127 06-Jun-2019 2326 05/19/19 0434 05/20/19 0637  WBC 19.6*   < > 20.8*  --  15.9*  --  19.0*  --   --  22.9*  --  23.0* 22.8*  NEUTROABS 17.1*  --  17.8*  --  13.3*  --   --   --   --  17.4*  --   --   --   HGB 17.2*   < > 16.3   < > 14.7   < > 13.2   < > 11.9* 11.7* 10.9* 9.5* 7.9*  HCT 53.3*   < > 51.1   < > 45.8   < > 38.6*   < > 35.0* 33.3* 32.0* 27.8* 23.4*  MCV 94.3   < > 93.1  --  94.2  --  88.9  --   --  87.4  --  88.8 90.7  PLT 213   < > 204  --  149*  --  139*  --   --  120*  --  89* 66*   < > = values in this interval not displayed.   Basic Metabolic Panel: Recent Labs  Lab 04/25/2019 1600  June 06, 2019 0350 June 06, 2019 1200  Jun 06, 2019 1610  Jun 06, 2019 1737  June 06, 2019 2001 Jun 06, 2019 2127 Jun 06, 2019 2326 05/19/19 0434 05/20/19 0637  NA 134*   < > 137 138   < >  --    < > 139   < > 138 137 136 138 137  K 5.4*   < > 4.3 3.6   < >  --    < > 3.4*   < > 3.5 3.4* 3.5 3.6 3.3*  CL 105   < > 98 97*  --   --   --  96*  --  97* 98  --  102 104  CO2 17*   < > 27 27  --   --   --   --   --   --  25  --  25 22  GLUCOSE 162*   < > 153* 147*  --   --   --  131*  --  135* 148*  --  168* 176*  BUN 34*   < > 43* 48*  --   --   --  50*  --  46* 51*  --  51* 56*  CREATININE 2.48*   < > 2.79* 3.05*  --   --   --  3.00*  --  3.20* 3.43*  --  3.49* 3.90*  CALCIUM 7.9*   < > 8.4* 8.4*  --   --   --   --   --   --  8.4*  --  7.8* 7.5*  MG 2.0  --  2.5*  --   --  2.3  --   --   --   --   --   --  2.0 2.0  PHOS 5.8*  --  4.0  --   --  3.8  --   --   --    --   --   --  3.7 3.4   < > = values in this interval not displayed.   GFR: Estimated Creatinine Clearance: 16.7 mL/min (A) (by C-G formula based on SCr of 3.9 mg/dL (H)). Recent Labs  Lab 05/25/2019 0900  05/10/2019 1752 04/27/2019 0350 05/21/2019 0448 05/08/2019 1200 05/04/2019 2127 04/26/2019 2327 05/19/19 0434 05/20/19 0637  PROCALCITON 5.55  --   --  6.85  --   --   --   --  3.64  --   WBC 20.8*   < >  --  19.0*  --   --  22.9*  --  23.0* 22.8*  LATICACIDVEN  --    < > 2.1*  --  2.1* 1.4  --  1.6  --   --    < > = values in this interval not displayed.   Liver Function Tests: Recent Labs  Lab 05/13/2019 0350 05/21/2019 1200 05/04/2019 2127 05/19/19 0434 05/20/19 0637  AST 128* 130* 118* 64* 27  ALT 25 30 24 22 16   ALKPHOS 63 63 61 53 48  BILITOT 3.3* 2.7* 1.8* 1.1 1.0  PROT 5.5* 5.7* 5.3* 4.8* 4.8*  ALBUMIN 2.7* 2.7* 2.5* 2.2* 2.2*   No results for input(s): LIPASE, AMYLASE in the last 168 hours. No results for input(s): AMMONIA in the last 168 hours. ABG    Component Value Date/Time   PHART 7.480 (H) 05/12/2019 2326   PCO2ART 39.9 05/09/2019 2326   PO2ART 124.0 (H) 05/11/2019 2326   HCO3 29.7 (H) 05/24/2019 2326   TCO2 31 05/02/2019 2326   ACIDBASEDEF 6.0 (H) 05/14/2019 1636   O2SAT 73.3 05/20/2019 0445    Coagulation Profile: Recent Labs  Lab 05/22/2019 0022 05/04/2019 2127  INR 1.7* 1.5*   Cardiac Enzymes: No results for input(s): CKTOTAL, CKMB, CKMBINDEX, TROPONINI in the last 168 hours. HbA1C: Hgb A1c MFr Bld  Date/Time Value Ref Range Status  05/08/2019 11:25 PM 5.5 4.8 - 5.6 % Final    Comment:    (NOTE) Pre diabetes:          5.7%-6.4% Diabetes:              >6.4% Glycemic control for   <7.0% adults with diabetes    CBG: Recent Labs  Lab 05/19/19 1920 05/19/19 2006 05/19/19 2352 05/20/19 0359 05/20/19 0802  GLUCAP 155* 144* 164* 160* 137*    Critical care time: 40 min.   The patient is critically ill with multiple organ system failure and  requires high complexity decision making for assessment and support, frequent evaluation and titration of therapies, advanced monitoring, review of radiographic studies and interpretation of complex data.   Critical Care Time devoted to patient care services, exclusive of separately billable procedures, described in this note is 40 minutes.   05/22/19 MD [facility] Pager [phone] If no answer call [phone] 05/20/2019, 9:18 AM

## 2019-05-20 NOTE — Progress Notes (Addendum)
Advanced Heart Failure Rounding Note   Subjective:    Intubated/sedated.   Underwent placement of Impella 5.0 on 10/24 with removal of R femoral Impella CP.   Remains intubated/sedated. NE increased to 27 mcg + milrinone. 0.25. Co-ox  73% . Creatinine trending up 3.9. LDH trending down 670.  Hgb down 7.9, plts down 66, WBCs up 23.  Getting vancomycin/cefepime.   Swan #s  PA 40/28 (33)  SVR 1145 CVP 7-8  CI 2.26 CO 4.1  SVR 1145  Impella P-8 No flows due to damage of sensor during placement.   RHC/LHC (10/23):  Coronary Findings  Diagnostic Dominance: Right Left Main  75% distal left main stenosis.  Left Anterior Descending  Occluded proximally, some late filling by collaterals.  Ramus Intermedius  Large vessel, patent with luminal irregularities.  Left Circumflex  80% mid LCx stenosis. Small OM1 with 95% proximal stenosis. Large OM2 is subtotally occluded (fills late with TIMI 2 flow).  Right Coronary Artery  80% proximal stenosis. Serial 50% mid-vessel stenoses. PDA with only luminal irregularities. Large PLV with 80% mid-vessel stenosis.  Intervention  No interventions have been documented. Right Heart  Right Heart Pressures RHC Procedural Findings (mmHg): Hemodynamics RA mean 13 RV 70/17 PA 73/41, mean 53 PCWP mean 30 LV 140/40 AO 138/88  Oxygen saturations: PA 60% AO 97%  Cardiac Output (Fick) 2.95  Cardiac Index (Fick) 1.62 PVR 7.8 WU  Cardiac Output (Thermo) 3.24 Cardiac Index (Thermo) 1.78  PVR 7.1 WU  PAPI 2.46    Objective:   Weight Range:  Vital Signs:   Temp:  [95.9 F (35.5 C)-99.1 F (37.3 C)] 98.8 F (37.1 C) (10/26 0700) Pulse Rate:  [88-100] 97 (10/26 0423) Resp:  [0-21] 18 (10/26 0700) BP: (86-123)/(65-88) 94/81 (10/26 0700) SpO2:  [98 %-100 %] 99 % (10/26 0700) Arterial Line BP: (72-112)/(56-80) 96/62 (10/26 0700) FiO2 (%):  [40 %] 40 % (10/26 0423) Weight:  [74.5 kg] 74.5 kg (10/26 0440) Last BM Date:  (PTA)  Weight change: Filed Weights   05/08/2019 0658 05/19/19 0530 05/20/19 0440  Weight: 71.2 kg 71.5 kg 74.5 kg    Intake/Output:   Intake/Output Summary (Last 24 hours) at 05/20/2019 0729 Last data filed at 05/20/2019 0700 Gross per 24 hour  Intake 3127.67 ml  Output 820 ml  Net 2307.67 ml     Physical Exam: CVP 7-8  General:  Intubated/Sedated  HEENT: ETT Neck: supple. JVP 7-8 . Carotids 2+ bilat; no bruits. No lymphadenopathy or thryomegaly appreciated. RIJ swan  Cor: PMI nondisplaced. Regular rate & rhythm. No rubs, gallops or murmurs. Lungs: clear Abdomen: soft, nontender, nondistended. No hepatosplenomegaly. No bruits or masses. Good bowel sounds. Extremities: warm, no cyanosis, clubbing, rash, edema Neuro: sedated on the vent GU: Foley yellow urine    Telemetry: SR 90-100s with PVCs    Labs: Basic Metabolic Panel: Recent Labs  Lab 05/08/2019 1600  05/15/2019 0350 05/13/2019 1200  05/08/2019 1610  05/01/2019 1737  05/14/2019 2001 05/06/2019 2127 05/07/2019 2326 05/19/19 0434 05/20/19 0637  NA 134*   < > 137 138   < >  --    < > 139   < > 138 137 136 138 137  K 5.4*   < > 4.3 3.6   < >  --    < > 3.4*   < > 3.5 3.4* 3.5 3.6 3.3*  CL 105   < > 98 97*  --   --   --  96*  --  97* 98  --  102 104  CO2 17*   < > 27 27  --   --   --   --   --   --  25  --  25 22  GLUCOSE 162*   < > 153* 147*  --   --   --  131*  --  135* 148*  --  168* 176*  BUN 34*   < > 43* 48*  --   --   --  50*  --  46* 51*  --  51* 56*  CREATININE 2.48*   < > 2.79* 3.05*  --   --   --  3.00*  --  3.20* 3.43*  --  3.49* 3.90*  CALCIUM 7.9*   < > 8.4* 8.4*  --   --   --   --   --   --  8.4*  --  7.8* 7.5*  MG 2.0  --  2.5*  --   --  2.3  --   --   --   --   --   --  2.0 2.0  PHOS 5.8*  --  4.0  --   --  3.8  --   --   --   --   --   --  3.7 3.4   < > = values in this interval not displayed.    Liver Function Tests: Recent Labs  Lab 05/20/2019 0350 05/20/2019 1200 05/20/2019 2127 05/19/19 0434  05/20/19 0637  AST 128* 130* 118* 64* 27  ALT '25 30 24 22 16  ' ALKPHOS 63 63 61 53 48  BILITOT 3.3* 2.7* 1.8* 1.1 1.0  PROT 5.5* 5.7* 5.3* 4.8* 4.8*  ALBUMIN 2.7* 2.7* 2.5* 2.2* 2.2*   No results for input(s): LIPASE, AMYLASE in the last 168 hours. No results for input(s): AMMONIA in the last 168 hours.  CBC: Recent Labs  Lab 05/12/2019 1834  05/02/2019 0900  05/04/2019 1504  05/21/2019 0350  05/25/2019 2001 04/25/2019 2127 05/21/2019 2326 05/19/19 0434 05/20/19 0637  WBC 19.6*   < > 20.8*  --  15.9*  --  19.0*  --   --  22.9*  --  23.0* 22.8*  NEUTROABS 17.1*  --  17.8*  --  13.3*  --   --   --   --  17.4*  --   --   --   HGB 17.2*   < > 16.3   < > 14.7   < > 13.2   < > 11.9* 11.7* 10.9* 9.5* 7.9*  HCT 53.3*   < > 51.1   < > 45.8   < > 38.6*   < > 35.0* 33.3* 32.0* 27.8* 23.4*  MCV 94.3   < > 93.1  --  94.2  --  88.9  --   --  87.4  --  88.8 90.7  PLT 213   < > 204  --  149*  --  139*  --   --  120*  --  89* 66*   < > = values in this interval not displayed.    Cardiac Enzymes: No results for input(s): CKTOTAL, CKMB, CKMBINDEX, TROPONINI in the last 168 hours.  BNP: BNP (last 3 results) Recent Labs    05/23/2019 1739 04/28/2019 1234  BNP 2,725.3* 3,370.0*    ProBNP (last 3 results) No results for input(s): PROBNP in the last 8760 hours.    Other results:  Imaging: Dg Chest 1 View  Result Date: 05/15/2019 CLINICAL DATA:  Impella insertion EXAM: DG C-ARM 1-60 MIN; CHEST  1 VIEW COMPARISON:  Same-day chest radiographs FINDINGS: Intraoperative fluoroscopy demonstrates the presence of a transesophageal echo probe and placement of an Impella left ventricular assist device in expected positioning. Cardiomediastinal contours are similar to comparison exams. Evaluation of the lungs is limited these collimated fluoroscopic views. IMPRESSION: Fluoroscopy demonstrates presence of a transesophageal echo probe and Impella device in expected position. Electronically Signed   By: Lovena Le  M.D.   On: 05/21/2019 22:25   Dg Chest 1 View  Result Date: 05/04/2019 CLINICAL DATA:  Impella insertion EXAM: CHEST  1 VIEW COMPARISON:  Same day radiographs FINDINGS: Stable satisfactory positioning of a transesophageal tube, endotracheal tube, and right upper extremity PICC. Interval placement of an Impella left ventricular assist device via the right axillary artery. Streaky atelectatic opacities are present in the lungs. Lungs are otherwise clear. Cardiomediastinal contours are stable. Atherosclerotic calcification of the aorta is noted. IMPRESSION: 1. Interval placement of an Impella left ventricular assist device in expected position. 2. Stable satisfactory positioning of a transesophageal tube, endotracheal tube, and right upper extremity PICC. 3. Bibasilar atelectasis with improving opacity in the lung bases. Electronically Signed   By: Lovena Le M.D.   On: 04/25/2019 22:23   Dg Chest Port 1 View  Result Date: 05/19/2019 CLINICAL DATA:  Endotracheal tube EXAM: PORTABLE CHEST 1 VIEW COMPARISON:  Chest radiograph from 05/14/2019 FINDINGS: A right upper extremity peripherally inserted central venous catheter tip overlies the superior vena cava. An Impella left ventricular assist device is unchanged in position. An endotracheal tube terminates in the midthoracic trachea. An enteric tube terminates in the stomach. The heart size is normal. Vascular calcifications are seen in the aortic arch. The lungs are clear. There is no pleural effusion or pneumothorax. IMPRESSION: 1. No significant interval change in the appearance of the chest. Electronically Signed   By: Zerita Boers M.D.   On: 05/19/2019 15:24   Dg Chest Port 1 View  Result Date: 05/19/2019 CLINICAL DATA:  Central line placement EXAM: PORTABLE CHEST 1 VIEW COMPARISON:  Chest radiograph from the same day FINDINGS: There has been interval placement of a right internal jugular Swan-Ganz catheter with tip overlying the right pulmonary  artery. A right upper extremity peripherally inserted central venous catheter tip overlies the superior vena cava. An Impella left ventricular assist device is unchanged in position. An endotracheal tube terminates in the midthoracic trachea. An enteric tube terminates in the stomach. The heart size is normal. Vascular calcifications are seen in the aortic arch. The lungs are clear. There is no pleural effusion or pneumothorax. IMPRESSION: 1. Swan-Ganz catheter tip overlies the right pulmonary artery. 2. No pneumothorax. 3. No acute findings in the chest. Electronically Signed   By: Zerita Boers M.D.   On: 05/19/2019 15:23   Dg C-arm 1-60 Min  Result Date: 04/26/2019 CLINICAL DATA:  Impella insertion EXAM: DG C-ARM 1-60 MIN; CHEST  1 VIEW COMPARISON:  Same-day chest radiographs FINDINGS: Intraoperative fluoroscopy demonstrates the presence of a transesophageal echo probe and placement of an Impella left ventricular assist device in expected positioning. Cardiomediastinal contours are similar to comparison exams. Evaluation of the lungs is limited these collimated fluoroscopic views. IMPRESSION: Fluoroscopy demonstrates presence of a transesophageal echo probe and Impella device in expected position. Electronically Signed   By: Lovena Le M.D.   On: 05/25/2019 22:25     Medications:     Scheduled Medications:  aspirin  81 mg Per Tube Daily   atorvastatin  80 mg Per Tube q1800   chlorhexidine gluconate (MEDLINE KIT)  15 mL Mouth Rinse BID   Chlorhexidine Gluconate Cloth  6 each Topical Daily   insulin aspart  1-3 Units Subcutaneous Q4H   lidocaine-EPINEPHrine  20 mL Infiltration Once   mouth rinse  15 mL Mouth Rinse 10 times per day   pantoprazole (PROTONIX) IV  40 mg Intravenous QHS   potassium chloride  20 mEq Oral Once   sodium chloride flush  10-40 mL Intracatheter Q12H   sodium chloride flush  3 mL Intravenous Q12H   vancomycin variable dose per unstable renal function  (pharmacist dosing)   Does not apply See admin instructions    Infusions:  sodium chloride 10 mL/hr at 05/22/2019 1600   sodium chloride 10 mL/hr at 05/20/19 0700   sodium chloride     ceFEPime (MAXIPIME) IV Stopped (05/19/19 0915)   feeding supplement (VITAL AF 1.2 CAL) 20 mL/hr at 05/20/19 0400   fentaNYL infusion INTRAVENOUS 50 mcg/hr (05/20/19 0700)   impella catheter heparin 50 unit/mL in dextrose 5%     heparin Stopped (05/15/2019 1642)   midazolam Stopped (05/20/19 0502)   milrinone 0.25 mcg/kg/min (05/20/19 0700)   norepinephrine (LEVOPHED) Adult infusion 27 mcg/min (05/20/19 0700)    PRN Medications: sodium chloride, Place/Maintain arterial line **AND** sodium chloride, docusate, fentaNYL, fentaNYL (SUBLIMAZE) injection, fentaNYL (SUBLIMAZE) injection, midazolam, midazolam, sodium chloride flush   Assessment/Plan:   1. 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 LM 75%, LAD 100%, LCX 80%, RCA 80% prox - Echo EF 20% RV moderately HK - Impella CP placed 10/23. Switch for Impella 5.0 on 10/24. On heparin gtt with Impella.  - No flow meter on Impella due to damage to sensor on insertion. ECHO later today  - On milrinone 0.25 NE 27 . Co-ox 73%.  - CVP 7-8 .  - Watch renal function closely - Lactate has cleared - PCT also markedly elevated concerning for PNA - continue vanc/cefepime    2. NSTEMI/CAD - hstrop 16k - Cath 10/23 with severe 3v CAD. LM 75%, LAD 100%, LCX 80%, RCA 80% prox - continue ASA/statin. No b-blocker with shock - CABG with Impella 5.0 support likely best option when able  3. AKI - baseline creatinine 1.3., now AKI likely due to ATN from shock. - Creatinine trending up 3.5>3.9.    4 Acute Respiratory Failure  -  remains intubated. - CCM appreciated.    5. PNA  - PCT 6.85-> 3.64 - WBC 23> 22.8  - Antibiotics per CCM (vanc/cefepime) - CX NGTD  6. LBBB  7. Anemia - Hgb  down to 7.9, ?due to hemolysis.  LDH better today.  - Will give 1 unit PRBCs.   8. Thrombocytopenia - Platelets 66K today, suspect fall related to sepsis/Impella, think HIT less likely.    Length of Stay: 4   Amy Clegg NP-C  05/20/2019, 7:29 AM  Advanced Heart Failure Team Pager [phone] (M-F; 7a - 4p)  Please contact [facility] for night-coverage after hours (4p -7a ) and weekends on [email or web address]  Patient seen with NP, agree with the above note.   Hemodynamically stable today with good cardiac output (CI 2.3 from [place], co-ox 73%).    WBCs elevated but afebrile, PCT has been high.  Remains on vancomycin/cefepime.  Possible RUL PNA on CXR.   UOP only 670 cc but CVP 7 on my  measurement today.  Creatinine up to 3.9.    General: Intubated/sedated.  Neck: No JVD, no thyromegaly or thyroid nodule.  Lungs: Decreased BS at bases. CV: Nondisplaced PMI.  Heart regular S1/S2, no S3/S4, no murmur. Trace ankle edema.   Abdomen: Soft, nontender, no hepatosplenomegaly, no distention.  Skin: Intact without lesions or rashes.  Neurologic: Sedated on vent.  Extremities: No clubbing or cyanosis.  HEENT: Normal.   Hemodynamics acceptable this morning with good cardiac output on current support: Impella 5.0 at P8, milrinone 0.25, norepinephrine 27.  Suspect mixed septic/cardiogenic shock. CVP 7. LDH trending down at 670.  - MAP stable today, wean NE as needed.  - Limited echo today for Impella position.  - Can hold off on diuresis for now with CVP 7.  - Continue heparin gtt for Impella.   Possible VAP with RUL density on CXR.  WBCs 23.  PCT has been elevated.  - Continue vancomycin/cefepime.   NSTEMI at admission, aim for CABG when stabilized.    Currently no indication for CVVH.  Follow renal indices closely.   With hgb 7.9 today, will give 1 unit PRBCs.   Thrombocytopenia likely related to Impella/hemolysis.   CRITICAL CARE Performed by: Loralie Champagne  Total critical care time:  45 minutes  Critical care time was exclusive of separately billable procedures and treating other patients.  Critical care was necessary to treat or prevent imminent or life-threatening deterioration.  Critical care was time spent personally by me on the following activities: development of treatment plan with patient and/or surrogate as well as nursing, discussions with consultants, evaluation of patient's response to treatment, examination of patient, obtaining history from patient or surrogate, ordering and performing treatments and interventions, ordering and review of laboratory studies, ordering and review of radiographic studies, pulse oximetry and re-evaluation of patient's condition.  Loralie Champagne 05/20/2019 8:00 AM

## 2019-05-20 NOTE — Progress Notes (Signed)
ANTICOAGULATION CONSULT NOTE - Follow Up Consult  Pharmacy Consult for Heparin  Indication: Impella  No Known Allergies  Patient Measurements: Height: 5\' 7"  (170.2 cm)(measured x3) Weight: 164 lb 3.9 oz (74.5 kg) IBW/kg (Calculated) : 66.1  Vital Signs: Temp: 97.9 F (36.6 C) (10/26 1130) Temp Source: Core (10/26 0903) BP: 89/72 (10/26 1100) Pulse Rate: 81 (10/26 1120)  Labs: Recent Labs    04/26/2019 0350 05/13/2019 0531  05/24/2019 2127 05/05/2019 2326 05/19/19 0434 05/20/19 0353 05/20/19 0637  HGB 13.2  --    < > 11.7* 10.9* 9.5*  --  7.9*  HCT 38.6*  --    < > 33.3* 32.0* 27.8*  --  23.4*  PLT 139*  --   --  120*  --  89*  --  66*  APTT  --   --   --  111*  --   --   --   --   LABPROT  --   --   --  17.7*  --   --   --   --   INR  --   --   --  1.5*  --   --   --   --   HEPARINUNFRC 0.41  --   --   --   --   --  <0.10*  --   CREATININE 2.79*  --    < > 3.43*  --  3.49*  --  3.90*  TROPONINIHS  --  16,606*  --   --   --   --   --   --    < > = values in this interval not displayed.    Estimated Creatinine Clearance: 16.7 mL/min (A) (by C-G formula based on SCr of 3.9 mg/dL (H)).   Assessment: 66 yoM admitted with shock and concern for ACS now s/p Impella CP placement in cath lab. Pharmacy to assist with heparin dosing.  Impella CP swapped out for 5.0 d/t hemolysis. LDH down this morning. Patient currently on heparin purge only - current rate 18.6 ml/hr, providing 930 units/hr of heparin. Holding off on systemic heparin at this time. Hgb trending down 15 on admit to 7.9, pltc down from 200s to 66, likely secondary to Impella. If continues to trend down may need to consider HIT, no evidence of thrombosis at this time.   Most recent ACT is 131. Heparin level undetectable this AM.  Goal of Therapy:  ACT 160-180 Monitor platelets by anticoagulation protocol: Yes   Plan:  Continue heparin in purge only Will follow along if initiating systemic heparin Monitor for  bleeding Daily ACT, heparin level, CBC  Vertis Kelch, PharmD PGY2 Cardiology Pharmacy Resident Phone [phone] 05/20/2019       11:46 AM  Please check [email or web address] for unit-specific pharmacist phone numbers

## 2019-05-20 NOTE — Progress Notes (Signed)
Nutrition Follow-up   DOCUMENTATION CODES:   Not applicable  INTERVENTION:   Continue tube feeding: - Vital AF 1.2 @ 30 ml/hr via OGT - Increase by 10 ml Q4 hours to goal rate of 55 ml/hr (1320 ml/day)  At goal tube feeding regimen provides 1584 kcal, 99 grams of protein, and 1071 ml of H2O.   NUTRITION DIAGNOSIS:   Inadequate oral intake related to inability to eat as evidenced by NPO status.  Ongoing  GOAL:   Patient will meet greater than or equal to 90% of their needs   Addressed via TF  MONITOR:   Vent status, Labs, Weight trends, Skin, I & O's  REASON FOR ASSESSMENT:   Ventilator    ASSESSMENT:   68 year old male who presented to the ED on 10/22 with chest pain and SOB. PMH of CAD, HTN, MI in 1998. Pt admitted with acute hypoxic respiratory failure due to acute pulmonary edema, PNA, possible ACS/NSTEMI. Pt required intubation.   10/23- s/p right/left heart cath 10/24- impella changed from femoral to R axillary  Pt discussed during ICU rounds and with RN.   Weaning levophed. Off sedation. Cr rising- no need for HD at this time. Vital AF 1.2 running at 30 ml/hr without complication. Plan titration to goal rate.    Admission weight: 72.6 kg  Current weight: 74.5 kg   Patient is currently intubated on ventilator support MV: 8.1 L/min Temp (24hrs), Avg:97.7 F (36.5 C), Min:95.9 F (35.5 C), Max:99.1 F (37.3 C)    I/O: +4,088 ml since admit UOP: 670 ml x 24 hrs  OGT: 150 ml x 24 hrs   Drips: versed, milrinone, levophed Medications: SS novolog Labs: K 3.3 (L) Cr 3.90-trending up   Diet Order:   Diet Order    None      EDUCATION NEEDS:   No education needs have been identified at this time  Skin:  Skin Assessment: Skin Integrity Issues: Skin Integrity Issues:: Incisions Incisions: R chest  Last BM:  PTA  Height:   Ht Readings from Last 1 Encounters:  05/25/2019 5\' 7"  (1.702 m)    Weight:   Wt Readings from Last 1 Encounters:   05/20/19 74.5 kg    Ideal Body Weight:  67.3 kg  BMI:  Body mass index is 25.72 kg/m.  Estimated Nutritional Needs:   Kcal:  1530 kcal  Protein:  90-110 grams  Fluid:  >/= 1.5 L/day  Mariana Single RD, LDN Clinical Nutrition Pager # - [phone]

## 2019-05-20 NOTE — Plan of Care (Signed)
  Problem: Clinical Measurements: Goal: Ability to maintain clinical measurements within normal limits will improve Outcome: Progressing Goal: Will remain free from infection Outcome: Progressing Goal: Diagnostic test results will improve Outcome: Progressing Goal: Respiratory complications will improve Outcome: Progressing Goal: Cardiovascular complication will be avoided Outcome: Progressing   Problem: Nutrition: Goal: Adequate nutrition will be maintained Outcome: Progressing   Problem: Coping: Goal: Level of anxiety will decrease Outcome: Progressing   Problem: Elimination: Goal: Will not experience complications related to bowel motility Outcome: Progressing Goal: Will not experience complications related to urinary retention Outcome: Progressing   Problem: Pain Managment: Goal: General experience of comfort will improve Outcome: Progressing   Problem: Safety: Goal: Ability to remain free from injury will improve Outcome: Progressing   Problem: Skin Integrity: Goal: Risk for impaired skin integrity will decrease Outcome: Progressing   Problem: Coping: Goal: Level of anxiety will decrease Outcome: Progressing   Problem: Fluid Volume: Goal: Risk for excess fluid volume will decrease Outcome: Progressing   Problem: Clinical Measurements: Goal: Ability to maintain clinical measurements within normal limits will improve Outcome: Progressing Goal: Will remain free from infection Outcome: Progressing   Problem: Respiratory: Goal: Will regain and/or maintain adequate ventilation Outcome: Progressing

## 2019-05-20 NOTE — Progress Notes (Signed)
eLink Physician-Brief Progress Note Patient Name: Raymond Chavez DOB: 01/06/1951 MRN: [id number]   Date of Service  05/20/2019  HPI/Events of Note  Ppeak = 41 on TV = 520. RT decreased TV to 450 and Ppeak now = 30.   eICU Interventions  Will order: 1. TV = 450. 2. ABG at 9:30 PM. 3. Portable CXR STAT.      Intervention Category Major Interventions: Respiratory failure - evaluation and management  Lysle Dingwall 05/20/2019, 8:37 PM

## 2019-05-20 NOTE — Progress Notes (Addendum)
Since shift change, pt has been fighting the ventilator. Breath sounds are restricted, and high peak pressure alarming. Pt also has high screaching sounds when coughing, RT concerned that ETT might be placed just at the larynx, reviewed CXR and calling MD. RT gave pt PRN breathing treatment. Pt given PRN sedation due to high peak pressure and high RR into the 40's. Trial off sedation again once pt calm and resting on vent. Pt started giving high peak pressures and high RR again so restarted sedation and gave additional PRN sedation bolus.   VT changed to 450 per RT and MD. Repeat ABG reads pH 7.40 CO2 36.7 PO2 138 BE -2 HCO3 22.7 TCO2 24 sO2 99%. MD reviewed CXR, no changes made.

## 2019-05-20 NOTE — Progress Notes (Signed)
[facility]    NEPHROLOGY PROGRESS NOTE  SUBJECTIVE: Patient seen and examined earlier this morning.  Patient sedated, unable to provide review of systems.  no acute events.   OBJECTIVE:  Vitals:   05/20/19 1230 05/20/19 1245  BP:    Pulse:    Resp: 18 18  Temp: (!) 97.5 F (36.4 C) (!) 97.5 F (36.4 C)  SpO2: 100% 100%    Intake/Output Summary (Last 24 hours) at 05/20/2019 1304 Last data filed at 05/20/2019 1215 Gross per 24 hour  Intake 3250.49 ml  Output 875 ml  Net 2375.49 ml      General: Sedated NAD HEENT: MMM [place] AT anicteric sclera, orally intubated Neck:  No JVD, no adenopathy CV:  Heart RRR  Lungs:  L/S CTA bilaterally Abd:  abd SNT/ND with normal BS GU:  Bladder non-palpable, positive Foley with clear urine Extremities: +2 bilateral lower extremity edema Skin:  No skin rash  MEDICATIONS:  . aspirin  81 mg Per Tube Daily  . atorvastatin  80 mg Per Tube q1800  . chlorhexidine gluconate (MEDLINE KIT)  15 mL Mouth Rinse BID  . Chlorhexidine Gluconate Cloth  6 each Topical Daily  . insulin aspart  1-3 Units Subcutaneous Q4H  . lidocaine-EPINEPHrine  20 mL Infiltration Once  . mouth rinse  15 mL Mouth Rinse 10 times per day  . pantoprazole (PROTONIX) IV  40 mg Intravenous QHS  . potassium chloride  20 mEq Oral Once  . sodium chloride flush  10-40 mL Intracatheter Q12H  . sodium chloride flush  3 mL Intravenous Q12H  . vancomycin variable dose per unstable renal function (pharmacist dosing)   Does not apply See admin instructions       LABS:   CBC Latest Ref Rng & Units 05/20/2019 05/19/2019 05/06/2019  WBC 4.0 - 10.5 K/uL 22.8(H) 23.0(H) -  Hemoglobin 13.0 - 17.0 g/dL 7.9(L) 9.5(L) 10.9(L)  Hematocrit 39.0 - 52.0 % 23.4(L) 27.8(L) 32.0(L)  Platelets 150 - 400 K/uL 66(L) 89(L) -    CMP Latest Ref Rng & Units 05/20/2019 05/19/2019 04/27/2019  Glucose 70 - 99 mg/dL 176(H) 168(H) -  BUN 8 - 23 mg/dL 56(H) 51(H) -  Creatinine 0.61 -  1.24 mg/dL 3.90(H) 3.49(H) -  Sodium 135 - 145 mmol/L 137 138 136  Potassium 3.5 - 5.1 mmol/L 3.3(L) 3.6 3.5  Chloride 98 - 111 mmol/L 104 102 -  CO2 22 - 32 mmol/L 22 25 -  Calcium 8.9 - 10.3 mg/dL 7.5(L) 7.8(L) -  Total Protein 6.5 - 8.1 g/dL 4.8(L) 4.8(L) -  Total Bilirubin 0.3 - 1.2 mg/dL 1.0 1.1 -  Alkaline Phos 38 - 126 U/L 48 53 -  AST 15 - 41 U/L 27 64(H) -  ALT 0 - 44 U/L 16 22 -    Lab Results  Component Value Date   CALCIUM 7.5 (L) 05/20/2019   CAION 1.12 (L) 05/03/2019   PHOS 3.4 05/20/2019       Component Value Date/Time   COLORURINE AMBER (A) 04/29/2019 1029   APPEARANCEUR HAZY (A) 05/14/2019 1029   LABSPEC 1.010 05/11/2019 1029   PHURINE 6.0 05/14/2019 1029   GLUCOSEU NEGATIVE 05/23/2019 1029   HGBUR LARGE (A) 05/11/2019 1029   BILIRUBINUR NEGATIVE 05/14/2019 [place] 05/06/2019 1029   PROTEINUR 100 (A) 05/07/2019 1029   NITRITE NEGATIVE 04/27/2019 1029   LEUKOCYTESUR NEGATIVE 05/09/2019 1029      Component Value Date/Time   PHART 7.480 (H) 05/04/2019 2326  PCO2ART 39.9 05/12/2019 2326   PO2ART 124.0 (H) 05/23/2019 2326   HCO3 29.7 (H) 04/29/2019 2326   TCO2 31 05/04/2019 2326   ACIDBASEDEF 6.0 (H) 05/15/2019 1636   O2SAT 73.3 05/20/2019 0445    No results found for: IRON, TIBC, FERRITIN, IRONPCTSAT     ASSESSMENT/PLAN:     1.  Baseline serum creatinine 1.34 on admission.  2.  Acute kidney injury.  Likely secondary to decreased cardiac output in the setting of congestive heart failure.  He may have had some component of contrast-induced nephropathy.   Urine output is picking up today. Continue current management/  Creatinine is up trending.  We will need to watch closely.  Urine clear after changed to Impella 5.0.  No urgent indication for dialysis.  3.  Coronary artery disease.  Triple-vessel disease noted on cardiac catheterization.  For possible CABG.  4.  Acute hypoxemic respiratory failure.  Secondary to congestive heart  failure.    Looks relatively euvolemic.  Being covered for possible pneumonia as well.  5.  Acute systolic congestive heart failure.  Ejection fraction less than 20%.  Continue balloon pump support.  Agree with holding furosemide for now as he does not clinically appear to be markedly fluid overloaded.  ACE inhibitor on hold in the setting of acute kidney injury.    [place], DO, [place]

## 2019-05-20 NOTE — Progress Notes (Signed)
   On Norepi 10 mcg + milrinone 0.37mcg.   CVP 7-8. Urine out 50-60 cc per hour.  PAP 32/23  CO4.1  CI 2.3   Off sedation. Remains on Vent   Amy Clegg NP-C 5:02 PM

## 2019-05-20 NOTE — Progress Notes (Signed)
EVENING ROUNDS NOTE :     [address]       [place] [address]             [phone]                 2 Days Post-Op Procedure(s) (LRB): INSERTION OF IMPELLA 5.0 IMPLANTABLE LEFT VENTRICULAR ASSIST DEVICE THROUGH RIGHT AXILLARY; REMOVAL OF RIGHT GROIN IMPELLA CP (N/A) right femoral artery repair and left heart catheterization   Total Length of Stay:  LOS: 4 days  Events:  Sedated Good hemodynamics    BP (!) 85/68   Pulse 81   Temp 98.1 F (36.7 C)   Resp 19   Ht 5\' 7"  (1.702 m) Comment: measured x3  Wt 74.5 kg   SpO2 100%   BMI 25.72 kg/m   PAP: (27-61)/(16-41) 33/21 CVP:  [3 mmHg-20 mmHg] 11 mmHg PCWP:  [15 mmHg-17 mmHg] 15 mmHg CO:  [3.6 L/min-5 L/min] 5 L/min CI:  [2 L/min/m2-2.8 L/min/m2] 2.8 L/min/m2  Vent Mode: PRVC FiO2 (%):  [40 %] 40 % Set Rate:  [18 bmp] 18 bmp Vt Set:  [520 mL] 520 mL PEEP:  [5 cmH20] 5 cmH20 Plateau Pressure:  [12 cmH20-17 cmH20] 17 cmH20  . sodium chloride 10 mL/hr at 05/20/19 0910  . sodium chloride Stopped (05/20/19 0907)  . sodium chloride    . ceFEPime (MAXIPIME) IV 2 g (05/20/19 0819)  . feeding supplement (VITAL AF 1.2 CAL)    . fentaNYL infusion INTRAVENOUS Stopped (05/20/19 0731)  . impella catheter heparin 50 unit/mL in dextrose 5%    . heparin Stopped (02-Jun-2019 1642)  . midazolam Stopped (05/20/19 0502)  . milrinone 0.25 mcg/kg/min (05/20/19 1700)  . norepinephrine (LEVOPHED) Adult infusion 10 mcg/min (05/20/19 1700)    I/O last 3 completed shifts: In: 5373.7 [I.V.:2243.1; Other:592.7; NG/GT:690; IV Piggyback:1847.8] Out: 2015 [Urine:1565; Emesis/NG output:150; Blood:300]   CBC Latest Ref Rng & Units 05/20/2019 05/19/2019 Jun 02, 2019  WBC 4.0 - 10.5 K/uL 22.8(H) 23.0(H) -  Hemoglobin 13.0 - 17.0 g/dL 7.9(L) 9.5(L) 10.9(L)  Hematocrit 39.0 - 52.0 % 23.4(L) 27.8(L) 32.0(L)  Platelets 150 - 400 K/uL 66(L) 89(L) -    BMP Latest Ref Rng & Units 05/20/2019 05/19/2019 02-Jun-2019  Glucose 70 - 99  mg/dL 176(H) 168(H) -  BUN 8 - 23 mg/dL 56(H) 51(H) -  Creatinine 0.61 - 1.24 mg/dL 3.90(H) 3.49(H) -  Sodium 135 - 145 mmol/L 137 138 136  Potassium 3.5 - 5.1 mmol/L 3.3(L) 3.6 3.5  Chloride 98 - 111 mmol/L 104 102 -  CO2 22 - 32 mmol/L 22 25 -  Calcium 8.9 - 10.3 mg/dL 7.5(L) 7.8(L) -    ABG    Component Value Date/Time   PHART 7.480 (H) 2019/06/02 2326   PCO2ART 39.9 06-02-2019 2326   PO2ART 124.0 (H) 06-02-19 2326   HCO3 29.7 (H) 06-02-2019 2326   TCO2 31 06-02-19 2326   ACIDBASEDEF 6.0 (H) 05/19/2019 1636   O2SAT 73.3 05/20/2019 0445       Melodie Bouillon, MD 05/20/2019 5:38 PM

## 2019-05-21 ENCOUNTER — Inpatient Hospital Stay (HOSPITAL_COMMUNITY): Payer: PPO

## 2019-05-21 ENCOUNTER — Encounter (HOSPITAL_COMMUNITY): Payer: Self-pay | Admitting: Cardiothoracic Surgery

## 2019-05-21 DIAGNOSIS — J9601 Acute respiratory failure with hypoxia: Secondary | ICD-10-CM | POA: Diagnosis not present

## 2019-05-21 DIAGNOSIS — R609 Edema, unspecified: Secondary | ICD-10-CM | POA: Diagnosis not present

## 2019-05-21 DIAGNOSIS — R570 Cardiogenic shock: Secondary | ICD-10-CM | POA: Diagnosis not present

## 2019-05-21 LAB — COMPREHENSIVE METABOLIC PANEL
ALT: 12 U/L (ref 0–44)
AST: 22 U/L (ref 15–41)
Albumin: 2.2 g/dL — ABNORMAL LOW (ref 3.5–5.0)
Alkaline Phosphatase: 56 U/L (ref 38–126)
Anion gap: 10 (ref 5–15)
BUN: 58 mg/dL — ABNORMAL HIGH (ref 8–23)
CO2: 20 mmol/L — ABNORMAL LOW (ref 22–32)
Calcium: 7.9 mg/dL — ABNORMAL LOW (ref 8.9–10.3)
Chloride: 106 mmol/L (ref 98–111)
Creatinine, Ser: 3.87 mg/dL — ABNORMAL HIGH (ref 0.61–1.24)
GFR calc Af Amer: 17 mL/min — ABNORMAL LOW (ref >60)
GFR calc non Af Amer: 15 mL/min — ABNORMAL LOW (ref >60)
Glucose, Bld: 133 mg/dL — ABNORMAL HIGH (ref 70–99)
Potassium: 3.3 mmol/L — ABNORMAL LOW (ref 3.5–5.1)
Sodium: 136 mmol/L (ref 135–145)
Total Bilirubin: 1.1 mg/dL (ref 0.3–1.2)
Total Protein: 4.7 g/dL — ABNORMAL LOW (ref 6.5–8.1)

## 2019-05-21 LAB — FIBRINOGEN: Fibrinogen: 489 mg/dL — ABNORMAL HIGH (ref 210–475)

## 2019-05-21 LAB — POCT I-STAT 7, (LYTES, BLD GAS, ICA,H+H)
Acid-base deficit: 2 mmol/L (ref 0.0–2.0)
Acid-base deficit: 2 mmol/L (ref 0.0–2.0)
Bicarbonate: 22.7 mmol/L (ref 20.0–28.0)
Bicarbonate: 23.7 mmol/L (ref 20.0–28.0)
Calcium, Ion: 1.17 mmol/L (ref 1.15–1.40)
Calcium, Ion: 1.21 mmol/L (ref 1.15–1.40)
HCT: 22 % — ABNORMAL LOW (ref 39.0–52.0)
HCT: 28 % — ABNORMAL LOW (ref 39.0–52.0)
Hemoglobin: 7.5 g/dL — ABNORMAL LOW (ref 13.0–17.0)
Hemoglobin: 9.5 g/dL — ABNORMAL LOW (ref 13.0–17.0)
O2 Saturation: 99 %
O2 Saturation: 100 %
Patient temperature: 37.3
Patient temperature: 37
Potassium: 3.6 mmol/L (ref 3.5–5.1)
Potassium: 3.3 mmol/L — ABNORMAL LOW (ref 3.5–5.1)
Sodium: 138 mmol/L (ref 135–145)
Sodium: 139 mmol/L (ref 135–145)
TCO2: 24 mmol/L (ref 22–32)
TCO2: 25 mmol/L (ref 22–32)
pCO2 arterial: 36.7 mmHg (ref 32.0–48.0)
pCO2 arterial: 42.9 mmHg (ref 32.0–48.0)
pH, Arterial: 7.4 (ref 7.350–7.450)
pH, Arterial: 7.351 (ref 7.350–7.450)
pO2, Arterial: 138 mmHg — ABNORMAL HIGH (ref 83.0–108.0)
pO2, Arterial: 191 mmHg — ABNORMAL HIGH (ref 83.0–108.0)

## 2019-05-21 LAB — CULTURE, BLOOD (ROUTINE X 2)
Culture: NO GROWTH
Culture: NO GROWTH
Special Requests: ADEQUATE

## 2019-05-21 LAB — APTT
aPTT: 65 seconds — ABNORMAL HIGH (ref 24–36)
aPTT: 69 seconds — ABNORMAL HIGH (ref 24–36)

## 2019-05-21 LAB — PHOSPHORUS: Phosphorus: 2.5 mg/dL (ref 2.5–4.6)

## 2019-05-21 LAB — MAGNESIUM: Magnesium: 2.1 mg/dL (ref 1.7–2.4)

## 2019-05-21 LAB — GLUCOSE, CAPILLARY
Glucose-Capillary: 102 mg/dL — ABNORMAL HIGH (ref 70–99)
Glucose-Capillary: 129 mg/dL — ABNORMAL HIGH (ref 70–99)
Glucose-Capillary: 130 mg/dL — ABNORMAL HIGH (ref 70–99)
Glucose-Capillary: 132 mg/dL — ABNORMAL HIGH (ref 70–99)
Glucose-Capillary: 140 mg/dL — ABNORMAL HIGH (ref 70–99)
Glucose-Capillary: 156 mg/dL — ABNORMAL HIGH (ref 70–99)

## 2019-05-21 LAB — CBC
HCT: 22.4 % — ABNORMAL LOW (ref 39.0–52.0)
Hemoglobin: 7.4 g/dL — ABNORMAL LOW (ref 13.0–17.0)
MCH: 30.1 pg (ref 26.0–34.0)
MCHC: 33 g/dL (ref 30.0–36.0)
MCV: 91.1 fL (ref 80.0–100.0)
Platelets: 36 10*3/uL — ABNORMAL LOW (ref 150–400)
RBC: 2.46 MIL/uL — ABNORMAL LOW (ref 4.22–5.81)
RDW: 14.4 % (ref 11.5–15.5)
WBC: 13.4 10*3/uL — ABNORMAL HIGH (ref 4.0–10.5)
nRBC: 1.1 % — ABNORMAL HIGH (ref 0.0–0.2)

## 2019-05-21 LAB — HEPARIN LEVEL (UNFRACTIONATED): Heparin Unfractionated: 0.15 IU/mL — ABNORMAL LOW (ref 0.30–0.70)

## 2019-05-21 LAB — COOXEMETRY PANEL
Carboxyhemoglobin: 1.8 % — ABNORMAL HIGH (ref 0.5–1.5)
Methemoglobin: 1.3 % (ref 0.0–1.5)
O2 Saturation: 67.7 %
Total hemoglobin: 9 g/dL — ABNORMAL LOW (ref 12.0–16.0)

## 2019-05-21 LAB — POCT ACTIVATED CLOTTING TIME
Activated Clotting Time: 136 seconds
Activated Clotting Time: 136 seconds

## 2019-05-21 LAB — PROCALCITONIN: Procalcitonin: 0.98 ng/mL

## 2019-05-21 LAB — LACTATE DEHYDROGENASE
LDH: 499 U/L — ABNORMAL HIGH (ref 98–192)
LDH: 498 U/L — ABNORMAL HIGH (ref 98–192)

## 2019-05-21 LAB — PREPARE RBC (CROSSMATCH)

## 2019-05-21 LAB — PATHOLOGIST SMEAR REVIEW

## 2019-05-21 MED ORDER — SODIUM CHLORIDE 0.9 % IV SOLN
0.0500 mg/kg/h | INTRAVENOUS | Status: DC
  Administered 2019-05-21 – 2019-05-23 (×2): 0.05 mg/kg/h via INTRAVENOUS
  Filled 2019-05-21 (×2): qty 250

## 2019-05-21 MED ORDER — DEXTROSE 5 % SOLN FOR IMPELLA PURGE CATHETER
INTRAVENOUS | Status: DC
  Administered 2019-05-21: 07:00:00 500 mL
  Filled 2019-05-21: qty 1000

## 2019-05-21 MED ORDER — POTASSIUM CHLORIDE 20 MEQ PO PACK
40.0000 meq | PACK | Freq: Once | ORAL | Status: AC
  Administered 2019-05-21: 40 meq via ORAL
  Filled 2019-05-21: qty 2

## 2019-05-21 MED ORDER — DEXMEDETOMIDINE HCL IN NACL 400 MCG/100ML IV SOLN
0.4000 ug/kg/h | INTRAVENOUS | Status: DC
  Administered 2019-05-21: 0.4 ug/kg/h via INTRAVENOUS
  Filled 2019-05-21: qty 100

## 2019-05-21 MED ORDER — PNEUMOCOCCAL VAC POLYVALENT 25 MCG/0.5ML IJ INJ: 0.5000 mL | INJECTION | INTRAMUSCULAR | Status: DC | PRN

## 2019-05-21 MED ORDER — SODIUM CHLORIDE 0.9 % IV SOLN: 250.0000 mL | INTRAVENOUS | Status: DC | PRN

## 2019-05-21 MED ORDER — AMIODARONE HCL IN DEXTROSE 360-4.14 MG/200ML-% IV SOLN
30.0000 mg/h | INTRAVENOUS | Status: DC
  Administered 2019-05-21 – 2019-05-28 (×13): 30 mg/h via INTRAVENOUS
  Filled 2019-05-21 (×16): qty 200

## 2019-05-21 MED ORDER — SODIUM CHLORIDE 0.9% FLUSH
3.0000 mL | Freq: Two times a day (BID) | INTRAVENOUS | Status: DC
  Administered 2019-05-22 – 2019-05-26 (×5): 3 mL via INTRAVENOUS

## 2019-05-21 MED ORDER — FUROSEMIDE 10 MG/ML IJ SOLN
60.0000 mg | Freq: Once | INTRAMUSCULAR | Status: AC
  Administered 2019-05-21: 60 mg via INTRAVENOUS
  Filled 2019-05-21: qty 6

## 2019-05-21 MED ORDER — INFLUENZA VAC A&B SA ADJ QUAD 0.5 ML IM PRSY
0.5000 mL | PREFILLED_SYRINGE | INTRAMUSCULAR | Status: DC | PRN
  Filled 2019-05-21: qty 0.5

## 2019-05-21 MED ORDER — SODIUM CHLORIDE 0.9% IV SOLUTION
Freq: Once | INTRAVENOUS | Status: AC
  Administered 2019-05-21: 10:00:00 via INTRAVENOUS

## 2019-05-21 MED ORDER — SODIUM CHLORIDE 0.9% FLUSH: 3.0000 mL | INTRAVENOUS | Status: DC | PRN

## 2019-05-21 MED ORDER — AMIODARONE HCL IN DEXTROSE 360-4.14 MG/200ML-% IV SOLN
60.0000 mg/h | INTRAVENOUS | Status: AC
  Administered 2019-05-21: 60 mg/h via INTRAVENOUS
  Filled 2019-05-21: qty 200

## 2019-05-21 NOTE — Progress Notes (Signed)
62mL Versed and 231mL fentanyl wasted in stericycle with Candyce Churn, RN d/t expired tubing.

## 2019-05-21 NOTE — Progress Notes (Signed)
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  No Known Allergies  Patient Measurements: Height: 5' 7" (170.2 cm)(measured x3) Weight: 164 lb 7.4 oz (74.6 kg) IBW/kg (Calculated) : 66.1  Vital Signs: Temp: 97.2 F (36.2 C) (10/27 1100) Temp Source: Core (Comment) (10/27 1020) BP: 121/89 (10/27 1000) Pulse Rate: 90 (10/27 1105)  Labs: Recent Labs    04/26/2019 2127  05/19/19 0434 05/20/19 0353 05/20/19 0637 05/20/19 2133 05/21/19 0406 05/21/19 1045  HGB 11.7*   < > 9.5*  --  7.9* 7.5* 7.4*  --   HCT 33.3*   < > 27.8*  --  23.4* 22.0* 22.4*  --   PLT 120*  --  89*  --  66*  --  36*  --   APTT 111*  --   --   --   --   --   --  65*  LABPROT 17.7*  --   --   --   --   --   --   --   INR 1.5*  --   --   --   --   --   --   --   HEPARINUNFRC  --   --   --  <0.10*  --   --  0.15*  --   CREATININE 3.43*  --  3.49*  --  3.90*  --  3.87*  --    < > = values in this interval not displayed.    Estimated Creatinine Clearance: 16.8 mL/min (A) (by C-G formula based on SCr of 3.87 mg/dL (H)).   Medical History: Past Medical History:  Diagnosis Date  . Acute respiratory failure with hypoxemia ([place]) 04/2019  . Carotid artery occlusion   . Hypertension     Medications:  Scheduled:  . sodium chloride   Intravenous Once  . aspirin  81 mg Per Tube Daily  . atorvastatin  80 mg Per Tube q1800  . chlorhexidine gluconate (MEDLINE KIT)  15 mL Mouth Rinse BID  . Chlorhexidine Gluconate Cloth  6 each Topical Daily  . insulin aspart  1-3 Units Subcutaneous Q4H  . lidocaine-EPINEPHrine  20 mL Infiltration Once  . mouth rinse  15 mL Mouth Rinse 10 times per day  . pantoprazole (PROTONIX) IV  40 mg Intravenous QHS  . potassium chloride  20 mEq Oral Once  . sodium chloride flush  10-40 mL Intracatheter Q12H  . sodium chloride flush  3 mL Intravenous Q12H  . sodium chloride flush  3 mL Intravenous Q12H   Infusions:  .  sodium chloride 10 mL/hr at 05/20/19 0910  . sodium chloride 10 mL/hr at 05/21/19 0700  . sodium chloride 10 mL/hr at 05/21/19 0000  . sodium chloride    . amiodarone 60 mg/hr (05/21/19 0849)  . amiodarone    . bivalirudin (ANGIOMAX) infusion 0.5 mg/mL (Non-ACS indications) 0.05 mg/kg/hr (05/21/19 0841)  . dextrose 5 % Impella 5.0 Purge solution    . feeding supplement (VITAL AF 1.2 CAL) 1,000 mL (05/20/19 2000)  . fentaNYL infusion INTRAVENOUS Stopped (05/21/19 0007)  . midazolam Stopped (05/20/19 0502)  . milrinone 0.25 mcg/kg/min (05/21/19 0700)  . norepinephrine (LEVOPHED) Adult infusion 3 mcg/min (05/21/19 0700)    Assessment: 46 yoM admitted with shock and concern for ACS now s/p Impella CP placement in cath lab.  Impella CP swapped out for 5.0 d/t hemolysis. LDH 498. Pltc dropping from 200s baseline down to 36 sec this  AM. 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 Purge solution now with D5W only. Pharmacy consulted to start bivalirudin for systemic anticoagulation while HIT panel is pending.   aPTT is within goal at 65 sec  Goal of Therapy:  aPTT 50-85 seconds Monitor platelets by anticoagulation protocol: Yes   Plan:  Continue bivalirudin at 0.05 mg/kg/hr Will check 5A/5P aPTT in case of accumulation in the setting of renal impairment Daily aPTT, CBC Monitor for bleeding, HIT panel results  Vertis Kelch, PharmD PGY2 Cardiology Pharmacy Resident Phone [phone] 05/21/2019       12:44 PM  Please check [email or web address] for unit-specific pharmacist phone numbers

## 2019-05-21 NOTE — Progress Notes (Signed)
[facility] ROUNDING NOTE   Subjective:   68 year old gentleman with a history of cardiogenic shock status post cardiac catheterization 05/06/2019 with severe three-vessel coronary disease.  Echocardiogram reveals ejection fraction of 20%.  Impella device placed 05/08/2019 with switch to 5.0 Impella 05/07/2019.  Continues on milrinone 0.25 mcg and norepinephrine 3 mcg.  Complicated by pneumonia treated with vancomycin/cefepime.  Although it appears that these antibiotics have now been discontinued.  Considering patient for CABG.  Blood pressure 120/78 pulse 88 temperature 96 O2 sats 97% FiO2 40% urine output 1.125 L 05/20/2019  Sodium 136 potassium 3.3 chloride 106 CO2 20 BUN 58 creatinine 3.86 glucose 133 calcium 7.9 phosphorus 2.5 magnesium 2.1 AST 22 ALT 12 WBC 13.4 hemoglobin 7.4 platelets 36  Aspirin 81 mg daily Lipitor 80 mg daily, Protonix 40 mg daily  IV amiodarone IV Angiomax IV milrinone IV norepinephrine   Objective:  Vital signs in last 24 hours:  Temp:  [92.8 F (33.8 C)-99.3 F (37.4 C)] 97.3 F (36.3 C) (10/27 0730) Pulse Rate:  [81-97] 95 (10/27 0726) Resp:  [6-33] 26 (10/27 0730) BP: (85-123)/(61-86) 114/85 (10/27 0700) SpO2:  [97 %-100 %] 100 % (10/27 0730) Arterial Line BP: (78-129)/(54-83) 115/71 (10/27 0730) FiO2 (%):  [40 %] 40 % (10/27 0726) Weight:  [74.6 kg] 74.6 kg (10/27 0500)  Weight change: 0.1 kg Filed Weights   05/19/19 0530 05/20/19 0440 05/21/19 0500  Weight: 71.5 kg 74.5 kg 74.6 kg    Intake/Output: I/O last 3 completed shifts: In: 4065.1 [I.V.:1297.6; Blood:315; Other:667.5; NG/GT:1785] Out: 1780 [DHRCB:6384]   Intake/Output this shift:  No intake/output data recorded.      General: Sedated NAD HEENT: MMM [place] AT anicteric sclera, orally intubated Neck:  No JVD, no adenopathy CV:  Heart RRR  Lungs:  L/S CTA bilaterally Abd:  abd SNT/ND with normal BS GU:  Bladder non-palpable, positive Foley with clear  urine Extremities: +2 bilateral lower extremity edema Skin:  No skin rash   Basic Metabolic Panel: Recent Labs  Lab 05/08/2019 0350 05/03/2019 1200  04/29/2019 1610  05/02/2019 2001 05/15/2019 2127 05/04/2019 2326 05/19/19 0434 05/20/19 0637 05/21/19 0406  NA 137 138   < >  --    < > 138 137 136 138 137 136  K 4.3 3.6   < >  --    < > 3.5 3.4* 3.5 3.6 3.3* 3.3*  CL 98 97*  --   --    < > 97* 98  --  102 104 106  CO2 27 27  --   --   --   --  25  --  25 22 20*  GLUCOSE 153* 147*  --   --    < > 135* 148*  --  168* 176* 133*  BUN 43* 48*  --   --    < > 46* 51*  --  51* 56* 58*  CREATININE 2.79* 3.05*  --   --    < > 3.20* 3.43*  --  3.49* 3.90* 3.87*  CALCIUM 8.4* 8.4*  --   --   --   --  8.4*  --  7.8* 7.5* 7.9*  MG 2.5*  --   --  2.3  --   --   --   --  2.0 2.0 2.1  PHOS 4.0  --   --  3.8  --   --   --   --  3.7 3.4 2.5   < > = values in  this interval not displayed.    Liver Function Tests: Recent Labs  Lab 05/08/2019 1200 04/27/2019 2127 05/19/19 0434 05/20/19 0637 05/21/19 0406  AST 130* 118* 64* 27 22  ALT _0 ALKPHOS 63 61 53 48 56  BILITOT 2.7* 1.8* 1.1 1.0 1.1  PROT 5.7* 5.3* 4.8* 4.8* 4.7*  ALBUMIN 2.7* 2.5* 2.2* 2.2* 2.2*   No results for input(s): LIPASE, AMYLASE in the last 168 hours. No results for input(s): AMMONIA in the last 168 hours.  CBC: Recent Labs  Lab 05/11/2019 1834  05/19/2019 0900  04/29/2019 1504  05/03/2019 0350  05/02/2019 2127 05/14/2019 2326 05/19/19 0434 05/20/19 0637 05/21/19 0406  WBC 19.6*   < > 20.8*  --  15.9*  --  19.0*  --  22.9*  --  23.0* 22.8* 13.4*  NEUTROABS 17.1*  --  17.8*  --  13.3*  --   --   --  17.4*  --   --   --   --   HGB 17.2*   < > 16.3   < > 14.7   < > 13.2   < > 11.7* 10.9* 9.5* 7.9* 7.4*  HCT 53.3*   < > 51.1   < > 45.8   < > 38.6*   < > 33.3* 32.0* 27.8* 23.4* 22.4*  MCV 94.3   < > 93.1  --  94.2  --  88.9  --  87.4  --  88.8 90.7 91.1  PLT 213   < > 204  --  149*  --  139*  --  120*  --  89* 66* 36*   < > =  values in this interval not displayed.    Cardiac Enzymes: No results for input(s): CKTOTAL, CKMB, CKMBINDEX, TROPONINI in the last 168 hours.  BNP: Invalid input(s): POCBNP  CBG: Recent Labs  Lab 05/20/19 1525 05/20/19 2015 05/21/19 0010 05/21/19 0417 05/21/19 0829  GLUCAP 143* 124* 156* 140* 130*    Microbiology: Results for orders placed or performed during the hospital encounter of 05/01/2019  Urine culture     Status: None   Collection Time: 05/02/2019 12:16 AM   Specimen: Urine, Random  Result Value Ref Range Status   Specimen Description URINE, RANDOM  Final   Special Requests NONE  Final   Culture   Final    NO GROWTH Performed at [facility], [place] [address], [place], [place] [address]    Report Status 05/22/2019 FINAL  Final  Respiratory Panel by PCR     Status: None   Collection Time: 04/30/2019  1:58 AM   Specimen: Nasopharyngeal Swab; Respiratory  Result Value Ref Range Status   Adenovirus NOT DETECTED NOT DETECTED Final   Coronavirus 229E NOT DETECTED NOT DETECTED Final    Comment: (NOTE) The Coronavirus on the Respiratory Panel, DOES NOT test for the novel  Coronavirus (2019 nCoV)    Coronavirus HKU1 NOT DETECTED NOT DETECTED Final   Coronavirus NL63 NOT DETECTED NOT DETECTED Final   Coronavirus OC43 NOT DETECTED NOT DETECTED Final   Metapneumovirus NOT DETECTED NOT DETECTED Final   Rhinovirus / Enterovirus NOT DETECTED NOT DETECTED Final   Influenza A NOT DETECTED NOT DETECTED Final   Influenza B NOT DETECTED NOT DETECTED Final   Parainfluenza Virus 1 NOT DETECTED NOT DETECTED Final   Parainfluenza Virus 2 NOT DETECTED NOT DETECTED Final   Parainfluenza Virus 3 NOT DETECTED NOT DETECTED Final   Parainfluenza Virus 4 NOT  DETECTED NOT DETECTED Final   Respiratory Syncytial Virus NOT DETECTED NOT DETECTED Final   Bordetella pertussis NOT DETECTED NOT DETECTED Final   Chlamydophila pneumoniae NOT DETECTED NOT DETECTED Final   Mycoplasma pneumoniae  NOT DETECTED NOT DETECTED Final    Comment: Performed at [facility], [place] [address] [place], [place] [address]  SARS Coronavirus 2 by RT PCR (hospital order, performed in [facility] hospital lab) Nasopharyngeal Nasopharyngeal Swab     Status: None   Collection Time: 05/04/2019  3:55 PM   Specimen: Nasopharyngeal Swab  Result Value Ref Range Status   SARS Coronavirus 2 NEGATIVE NEGATIVE Final    Comment: (NOTE) If result is NEGATIVE SARS-CoV-2 target nucleic acids are NOT DETECTED. The SARS-CoV-2 RNA is generally detectable in upper and lower  respiratory specimens during the acute phase of infection. The lowest  concentration of SARS-CoV-2 viral copies this assay can detect is 250  copies / mL. A negative result does not preclude SARS-CoV-2 infection  and should not be used as the sole basis for treatment or other  patient management decisions.  A negative result may occur with  improper specimen collection / handling, submission of specimen other  than nasopharyngeal swab, presence of viral mutation(s) within the  areas targeted by this assay, and inadequate number of viral copies  (<250 copies / mL). A negative result must be combined with clinical  observations, patient history, and epidemiological information. If result is POSITIVE SARS-CoV-2 target nucleic acids are DETECTED. The SARS-CoV-2 RNA is generally detectable in upper and lower  respiratory specimens dur ing the acute phase of infection.  Positive  results are indicative of active infection with SARS-CoV-2.  Clinical  correlation with patient history and other diagnostic information is  necessary to determine patient infection status.  Positive results do  not rule out bacterial infection or co-infection with other viruses. If result is PRESUMPTIVE POSTIVE SARS-CoV-2 nucleic acids MAY BE PRESENT.   A presumptive positive result was obtained on the submitted specimen  and confirmed on repeat testing.  While 2019  novel coronavirus  (SARS-CoV-2) nucleic acids may be present in the submitted sample  additional confirmatory testing may be necessary for epidemiological  and / or clinical management purposes  to differentiate between  SARS-CoV-2 and other Sarbecovirus currently known to infect humans.  If clinically indicated additional testing with an alternate test  methodology [phone]) is advised. The SARS-CoV-2 RNA is generally  detectable in upper and lower respiratory sp ecimens during the acute  phase of infection. The expected result is Negative. Fact Sheet for Patients:  StrictlyIdeas.no Fact Sheet for Healthcare Providers: BankingDealers.co.za This test is not yet approved or cleared by the [place] FDA and has been authorized for detection and/or diagnosis of SARS-CoV-2 by FDA under an Emergency Use Authorization (EUA).  This EUA will remain in effect (meaning this test can be used) for the duration of the COVID-19 declaration under Section 564(b)(1) of the Act, 21 U.S.C. section 360bbb-3(b)(1), unless the authorization is terminated or revoked sooner. Performed at [facility], [place] [address] [place], [place] [address]   Blood culture (routine x 2)     Status: None   Collection Time: 05/08/2019  9:00 PM   Specimen: BLOOD  Result Value Ref Range Status   Specimen Description BLOOD LEFT ANTECUBITAL  Final   Special Requests   Final    BOTTLES DRAWN AEROBIC AND ANAEROBIC Blood Culture adequate volume   Culture   Final  NO GROWTH 5 DAYS Performed at [facility], [place] [address] [place], [place] [address]    Report Status 05/21/2019 FINAL  Final  Blood culture (routine x 2)     Status: None   Collection Time: 05/03/2019  9:00 PM   Specimen: BLOOD RIGHT HAND  Result Value Ref Range Status   Specimen Description BLOOD RIGHT HAND  Final   Special Requests   Final    AEROBIC BOTTLE ONLY Blood Culture results may not be  optimal due to an inadequate volume of blood received in culture bottles   Culture   Final    NO GROWTH 5 DAYS Performed at [facility], [place] [address] [place], [place] [address]    Report Status 05/21/2019 FINAL  Final  MRSA PCR Screening     Status: None   Collection Time: 05/14/2019 11:20 PM   Specimen: Nasal Mucosa; Nasopharyngeal  Result Value Ref Range Status   MRSA by PCR NEGATIVE NEGATIVE Final    Comment:        The GeneXpert MRSA Assay (FDA approved for NASAL specimens only), is one component of a comprehensive MRSA colonization surveillance program. It is not intended to diagnose MRSA infection nor to guide or monitor treatment for MRSA infections. Performed at [facility], [place] [address] [place], [place] [address]   Culture, respiratory (tracheal aspirate)     Status: None   Collection Time: 05/11/2019  3:50 AM   Specimen: Tracheal Aspirate; Respiratory  Result Value Ref Range Status   Specimen Description TRACHEAL ASPIRATE  Final   Special Requests NONE  Final   Gram Stain   Final    RARE WBC PRESENT, PREDOMINANTLY PMN RARE GRAM POSITIVE COCCI IN PAIRS    Culture   Final    RARE Consistent with normal respiratory flora. Performed at [facility], [place] [address], [place], [place] [address]    Report Status 05/20/2019 FINAL  Final    Coagulation Studies: Recent Labs    05/01/2019 October 09, 2125  LABPROT 17.7*  INR 1.5*    Urinalysis: Recent Labs    05/09/2019 1029  COLORURINE AMBER*  LABSPEC 1.010  PHURINE 6.0  GLUCOSEU NEGATIVE  HGBUR LARGE*  BILIRUBINUR NEGATIVE  KETONESUR NEGATIVE  PROTEINUR 100*  NITRITE NEGATIVE  LEUKOCYTESUR NEGATIVE      Imaging: Dg Chest Port 1 View  Result Date: 05/21/2019 CLINICAL DATA:  Respiratory failure. EXAM: PORTABLE CHEST 1 VIEW COMPARISON:  May 20, 2019. FINDINGS: Endotracheal nasogastric tubes are unchanged in position. Left ventricular assist device is unchanged in position. Right internal  jugular Swan-Ganz catheter is noted with tip directed into right pulmonary artery. No pneumothorax or pleural effusion is noted. No acute pulmonary disease is noted. Bony thorax is unremarkable. IMPRESSION: Stable support apparatus. No acute cardiopulmonary abnormality seen. Electronically Signed   By: Marijo Conception M.D.   On: 05/21/2019 07:25   Dg Chest Port 1 View  Result Date: 05/20/2019 CLINICAL DATA:  Endotracheal tube placement EXAM: PORTABLE CHEST 1 VIEW COMPARISON:  05/20/2019 FINDINGS: Endotracheal tube 6.2 cm above the carina. NG tube extends below the hemidiaphragms into the stomach with the tip not visualized. Swan-Ganz catheter directed into the right pulmonary artery. Impella ventricular assist device unchanged. Stable heart size. Improvement in the central vascular congestion. No focal airspace process, collapse or consolidation. Negative for edema, large effusion or pneumothorax. IMPRESSION: Stable support apparatus. Improved vascular congestion/mild edema pattern. Electronically Signed   By: Jerilynn Mages.  Shick M.D.  On: 05/20/2019 20:56   Dg Chest Port 1 View  Result Date: 05/20/2019 CLINICAL DATA:  Endotracheal tube placement, respiratory failure. EXAM: PORTABLE CHEST 1 VIEW COMPARISON:  05/19/2019 and 05/02/2019 FINDINGS: Endotracheal tube tip 4.8 cm above the carina. Enteric tube courses into the stomach and off the film as tip is not visualized. Right IJ Swan-Ganz catheter unchanged with tip over the distal right main pulmonary artery. Right-sided PICC line unchanged with tip over the SVC. Impella ventricular assist device unchanged. Lungs are adequately inflated with subtle stable hazy density over the right upper lung and left infrahilar region. No effusion or pneumothorax. Cardiomediastinal silhouette and remainder of the exam is unchanged. IMPRESSION: Subtle stable hazy density over the right upper lung and left infrahilar region. Tubes and lines as described. Electronically Signed   By:  Marin Olp M.D.   On: 05/20/2019 07:44   Dg Chest Port 1 View  Result Date: 05/19/2019 CLINICAL DATA:  Central line placement EXAM: PORTABLE CHEST 1 VIEW COMPARISON:  Chest radiograph from the same day FINDINGS: There has been interval placement of a right internal jugular Swan-Ganz catheter with tip overlying the right pulmonary artery. A right upper extremity peripherally inserted central venous catheter tip overlies the superior vena cava. An Impella left ventricular assist device is unchanged in position. An endotracheal tube terminates in the midthoracic trachea. An enteric tube terminates in the stomach. The heart size is normal. Vascular calcifications are seen in the aortic arch. The lungs are clear. There is no pleural effusion or pneumothorax. IMPRESSION: 1. Swan-Ganz catheter tip overlies the right pulmonary artery. 2. No pneumothorax. 3. No acute findings in the chest. Electronically Signed   By: Zerita Boers M.D.   On: 05/19/2019 15:23     Medications:   . sodium chloride 10 mL/hr at 05/20/19 0910  . sodium chloride 10 mL/hr at 05/21/19 0700  . sodium chloride 10 mL/hr at 05/21/19 0000  . sodium chloride    . amiodarone 60 mg/hr (05/21/19 0849)  . amiodarone    . bivalirudin (ANGIOMAX) infusion 0.5 mg/mL (Non-ACS indications) 0.05 mg/kg/hr (05/21/19 0841)  . dextrose 5 % Impella 5.0 Purge solution    . feeding supplement (VITAL AF 1.2 CAL) 1,000 mL (05/20/19 2000)  . fentaNYL infusion INTRAVENOUS Stopped (05/21/19 0007)  . midazolam Stopped (05/20/19 0502)  . milrinone 0.25 mcg/kg/min (05/21/19 0700)  . norepinephrine (LEVOPHED) Adult infusion 3 mcg/min (05/21/19 0700)   . sodium chloride   Intravenous Once  . aspirin  81 mg Per Tube Daily  . atorvastatin  80 mg Per Tube q1800  . chlorhexidine gluconate (MEDLINE KIT)  15 mL Mouth Rinse BID  . Chlorhexidine Gluconate Cloth  6 each Topical Daily  . insulin aspart  1-3 Units Subcutaneous Q4H  . lidocaine-EPINEPHrine  20  mL Infiltration Once  . mouth rinse  15 mL Mouth Rinse 10 times per day  . pantoprazole (PROTONIX) IV  40 mg Intravenous QHS  . potassium chloride  20 mEq Oral Once  . sodium chloride flush  10-40 mL Intracatheter Q12H  . sodium chloride flush  3 mL Intravenous Q12H  . sodium chloride flush  3 mL Intravenous Q12H   sodium chloride, Place/Maintain arterial line **AND** sodium chloride, sodium chloride, albuterol, docusate, fentaNYL, fentaNYL (SUBLIMAZE) injection, fentaNYL (SUBLIMAZE) injection, influenza vaccine adjuvanted, midazolam, midazolam, pneumococcal 23 valent vaccine, sodium chloride flush, sodium chloride flush  Assessment/ Plan:  1.Baseline serum creatinine 1.34 on admission.  2. Acute kidney injury. Likely secondary to decreased cardiac  output in the setting of congestive heart failure. He may have had some component of contrast-induced nephropathy.  Urine output is picking up today.Continue current management/  .  .  Urinalysis clear. No urgent indication for dialysis.  Continue to avoid nephrotoxins no ACE inhibitor's ARB use nonsteroidal inflammatory drugs IV contrast.  Creatinine appears to have plateaued.  We will continue to follow  3. Coronary artery disease. Triple-vessel disease noted on cardiac catheterization. For possible CABG.  4.Acute hypoxemic respiratory failure. Secondary to congestive heart failure.   Looks relatively euvolemic. Being covered for possible pneumonia as well.  5. Acute systolic congestive heart failure. Ejection fraction less than 20%. Continue Impella device support.   Being considered for possible CABG  6.  Hypotension/volume continues on inotropic support.  Milrinone and Levophed  7.  Pneumonia.  Vancomycin and cefepime have been discontinued.    LOS: [place] _0 _1 :57 AM

## 2019-05-21 NOTE — Progress Notes (Signed)
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  No Known Allergies  Patient Measurements: Height: '5\' 7"'  (170.2 cm)(measured x3) Weight: 164 lb 7.4 oz (74.6 kg) IBW/kg (Calculated) : 66.1  Vital Signs: Temp: 97.7 F (36.5 C) (10/27 2000) Temp Source: Core (10/27 1600) BP: 106/80 (10/27 2000) Pulse Rate: 95 (10/27 1517)  Labs: Recent Labs    05/19/19 0434 05/20/19 0353 05/20/19 0637 05/20/19 2133 05/21/19 0406 05/21/19 1045 05/21/19 1710 05/21/19 2047  HGB 9.5*  --  7.9* 7.5* 7.4*  --  9.5*  --   HCT 27.8*  --  23.4* 22.0* 22.4*  --  28.0*  --   PLT 89*  --  66*  --  36*  --   --   --   APTT  --   --   --   --   --  65*  --  69*  HEPARINUNFRC  --  <0.10*  --   --  0.15*  --   --   --   CREATININE 3.49*  --  3.90*  --  3.87*  --   --   --     Estimated Creatinine Clearance: 16.8 mL/min (A) (by C-G formula based on SCr of 3.87 mg/dL (H)).   Medical History: Past Medical History:  Diagnosis Date  . Acute respiratory failure with hypoxemia ([place]) 04/2019  . Carotid artery occlusion   . Hypertension     Medications:  Scheduled:  . aspirin  81 mg Per Tube Daily  . atorvastatin  80 mg Per Tube q1800  . chlorhexidine gluconate (MEDLINE KIT)  15 mL Mouth Rinse BID  . Chlorhexidine Gluconate Cloth  6 each Topical Daily  . insulin aspart  1-3 Units Subcutaneous Q4H  . lidocaine-EPINEPHrine  20 mL Infiltration Once  . mouth rinse  15 mL Mouth Rinse 10 times per day  . pantoprazole (PROTONIX) IV  40 mg Intravenous QHS  . potassium chloride  20 mEq Oral Once  . sodium chloride flush  10-40 mL Intracatheter Q12H  . sodium chloride flush  3 mL Intravenous Q12H  . sodium chloride flush  3 mL Intravenous Q12H   Infusions:  . sodium chloride 10 mL/hr at 05/20/19 0910  . sodium chloride 10 mL/hr at 05/21/19 2100  . sodium chloride 10 mL/hr at 05/21/19 2100  . sodium chloride    . amiodarone 30 mg/hr  (05/21/19 2100)  . bivalirudin (ANGIOMAX) infusion 0.5 mg/mL (Non-ACS indications) 0.05 mg/kg/hr (05/21/19 2100)  . dexmedetomidine (PRECEDEX) IV infusion 0.4 mcg/kg/hr (05/21/19 2100)  . dextrose 5 % Impella 5.0 Purge solution    . feeding supplement (VITAL AF 1.2 CAL) 1,000 mL (05/20/19 2000)  . fentaNYL infusion INTRAVENOUS Stopped (05/21/19 0007)  . midazolam Stopped (05/20/19 0502)  . milrinone 0.25 mcg/kg/min (05/21/19 2100)  . norepinephrine (LEVOPHED) Adult infusion Stopped (05/21/19 2037)    Assessment: 17 yoM admitted with shock and concern for ACS now s/p Impella CP placement in cath lab.  Impella CP swapped out for 5.0 d/t hemolysis. LDH 498. Pltc dropping from 200s baseline down to 36 sec this AM. 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 Purge solution now with D5W only. Pharmacy consulted to start bivalirudin for systemic anticoagulation while HIT panel is pending.   Confirmatory aPTT is within goal at 69 sec. No bleeding issues noted.   Goal of Therapy:  aPTT 50-85 seconds Monitor platelets by anticoagulation protocol: Yes   Plan:  Continue bivalirudin  at 0.05 mg/kg/hr Will check 5A/5P aPTT in case of accumulation in the setting of renal impairment Daily aPTT, CBC Monitor for bleeding, HIT panel results  Erin Hearing PharmD., BCPS Clinical Pharmacist 05/21/2019 9:53 PM

## 2019-05-21 NOTE — Progress Notes (Signed)
Dr. [name] concern for HIT with dropping hgb and plts. Orders to stop heparin purge solution and change to D5 solution.

## 2019-05-21 NOTE — Progress Notes (Addendum)
NAME:  Osbon Svay, MRN:  [id number], DOB:  22-Sep-1950, LOS: 5 ADMISSION DATE:  05/07/2019, CONSULTATION DATE:  05/01/2019 REFERRING MD:  Dalene Seltzer  CHIEF COMPLAINT:  SOB   Brief History   Adyan Harbeson is a 68 y.o. male who was admitted with acute hypoxic respiratory failure due to acute pulmonary edema, PNA, possible ACS / NSTEMI.  He required intubation in the ED after failing BiPAP.  Past Medical History  HTN, CAD.  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  10/27-heparin stopped due to low platelets.  HIT panel sent.  Starting bivalirudin.  Off Levophed.  On minimal vent support but failed weaning trials  Consults:  Cardiology PCCM Nephrology   Procedures:  ETT 10/22 >  Swan 10/22 > Impella 10/23 >> 10/24 Changed to 5.0 via right axillary >>   Significant Diagnostic Tests:  CTA chest 10/22 > no PE, b/l basilar consolidation, diffuse interlobular septal thickening with GGO's, b/l hilar adenopathy. Echo 10/23 > severely decreased LV function.  Cannot estimate EF  Micro Data:  Blood 10/22, 10/24>>> Sputum 10/22 >>>  Urine 10/22 >>> Urine Strep > Negative  RVP 10/22 > Neg MRSA PCR > Neg SARS CoV2 10/22 > neg.  Antimicrobials:  Vanc 10/22 > 10/25 Cefepime 10/22 >    Interim history/subjective:  Off sedation.  Pressure support weans.  He has been off Levophed Continues on amnio, milrinone Bivalirudin started for low platelets, concern for HIT  Objective:  Blood pressure 114/85, pulse 95, temperature (!) 97.3 F (36.3 C), resp. rate (!) 26, height 5\' 7"  (1.702 m), weight 74.6 kg, SpO2 100 %. PAP: (31-76)/(14-50) 60/35 CVP:  [4 mmHg-36 mmHg] 13 mmHg PCWP:  [6 mmHg-15 mmHg] 6 mmHg CO:  [5 L/min-7.1 L/min] 6.8 L/min CI:  [2.8 L/min/m2-3.9 L/min/m2] 3.8 L/min/m2  Vent Mode: PRVC FiO2  (%):  [40 %] 40 % Set Rate:  [18 bmp] 18 bmp Vt Set:  [450 mL-520 mL] 450 mL PEEP:  [5 cmH20] 5 cmH20 Plateau Pressure:  [15 cmH20-25 cmH20] 19 cmH20   Intake/Output Summary (Last 24 hours) at 05/21/2019 1103 Last data filed at 05/21/2019 0700 Gross per 24 hour  Intake 2423.5 ml  Output 1120 ml  Net 1303.5 ml   Filed Weights   05/19/19 0530 05/20/19 0440 05/21/19 0500  Weight: 71.5 kg 74.5 kg 74.6 kg    Examination: Gen:      No acute distress HEENT:  EOMI, sclera anicteric Neck:     No masses; no thyromegaly, ETT Lungs:    Clear to auscultation bilaterally; normal respiratory effort CV:         Regular rate and rhythm; no murmurs Abd:      + bowel sounds; soft, non-tender; no palpable masses, no distension Ext:    No edema; adequate peripheral perfusion Skin:      Warm and dry; no rash Neuro: Awake, responsive on vent  Assessment & Plan:   Acute hypoxic respiratory failure requiring intubation - multifactorial due to PNA, acute pulmonary edema in setting of Cardiogenic Shock  Plan  Pressure support weans as tolerated Chest x-ray today reviewed with stable supportive therapies.  No acute abnormality.  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 LDH better today Continue preadmission ASA. Hold preadmission amlodipine, atorvastatin, HCTZ, lisinopril.  Acute Kidney Injury  Creatinine plateaued, urine output is okay.  Hyponatremia - presumed hypervolemic in setting underlying CHF. >>> Improved  NAGMA. >> Improved  Plan Nephrology  following Continue supportive care.  No indication for hemodialysis yet as he is making good urine  Thrombocytopenia HIT panel pending. On bivaluridin  now  Best Practice:  Diet: Increase tube feeds to goal Pain/Anxiety/Delirium protocol: Fentanyl gtt / Versed   PRN.  RASS goal 0 to -1. VAP protocol DVT prophylaxis: SCD's /bivalirudin GI prophylaxis: PPI. Glucose control: SSI. Mobility: Bedrest. Code Status: Full. Family Communication: Will call daughter Carmelia Bake listed in system). Disposition: ICU.  Labs   CBC: Recent Labs  Lab 05/04/2019 1834  05/14/2019 0900  05/22/2019 1504  05/09/2019 0350  05/09/2019 2127 05/03/2019 2326 05/19/19 0434 05/20/19 0637 05/21/19 0406  WBC 19.6*   < > 20.8*  --  15.9*  --  19.0*  --  22.9*  --  23.0* 22.8* 13.4*  NEUTROABS 17.1*  --  17.8*  --  13.3*  --   --   --  17.4*  --   --   --   --   HGB 17.2*   < > 16.3   < > 14.7   < > 13.2   < > 11.7* 10.9* 9.5* 7.9* 7.4*  HCT 53.3*   < > 51.1   < > 45.8   < > 38.6*   < > 33.3* 32.0* 27.8* 23.4* 22.4*  MCV 94.3   < > 93.1  --  94.2  --  88.9  --  87.4  --  88.8 90.7 91.1  PLT 213   < > 204  --  149*  --  139*  --  120*  --  89* 66* 36*   < > = values in this interval not displayed.   Basic Metabolic Panel: Recent Labs  Lab 05/06/2019 0350 05/12/2019 1200  05/01/2019 1610  05/11/2019 2001 05/23/2019 2127 05/13/2019 2326 05/19/19 0434 05/20/19 0637 05/21/19 0406  NA 137 138   < >  --    < > 138 137 136 138 137 136  K 4.3 3.6   < >  --    < > 3.5 3.4* 3.5 3.6 3.3* 3.3*  CL 98 97*  --   --    < > 97* 98  --  102 104 106  CO2 27 27  --   --   --   --  25  --  25 22 20*  GLUCOSE 153* 147*  --   --    < > 135* 148*  --  168* 176* 133*  BUN 43* 48*  --   --    < > 46* 51*  --  51* 56* 58*  CREATININE 2.79* 3.05*  --   --    < > 3.20* 3.43*  --  3.49* 3.90* 3.87*  CALCIUM 8.4* 8.4*  --   --   --   --  8.4*  --  7.8* 7.5* 7.9*  MG 2.5*  --   --  2.3  --   --   --   --  2.0 2.0 2.1  PHOS 4.0  --   --  3.8   --   --   --   --  3.7 3.4 2.5   < > = values in this interval not displayed.   GFR: Estimated Creatinine Clearance: 16.8 mL/min (A) (by C-G formula based on SCr of 3.87 mg/dL (H)). Recent Labs  Lab 05/07/2019 0900  05/25/2019 1752 05/02/2019 0350 05/12/2019 0448 04/29/2019 1200 04/28/2019 2127 04/29/2019 2327 05/19/19 0434 05/20/19 0637 05/21/19 0406  PROCALCITON 5.55  --   --  6.85  --   --   --   --  3.64  --   --   WBC 20.8*   < >  --  19.0*  --   --  22.9*  --  23.0* 22.8* 13.4*  LATICACIDVEN  --    < > 2.1*  --  2.1* 1.4  --  1.6  --   --   --    < > = values in this interval not displayed.   Liver Function Tests: Recent Labs  Lab 04/25/2019 1200 05/07/2019 2127 05/19/19 0434 05/20/19 0637 05/21/19 0406  AST 130* 118* 64* 27 22  ALT 30 24 22 16 12   ALKPHOS 63 61 53 48 56  BILITOT 2.7* 1.8* 1.1 1.0 1.1  PROT 5.7* 5.3* 4.8* 4.8* 4.7*  ALBUMIN 2.7* 2.5* 2.2* 2.2* 2.2*   No results for input(s): LIPASE, AMYLASE in the last 168 hours. No results for input(s): AMMONIA in the last 168 hours. ABG    Component Value Date/Time   PHART 7.480 (H) 05/07/2019 2326   PCO2ART 39.9 05/20/2019 2326   PO2ART 124.0 (H) 04/27/2019 2326   HCO3 29.7 (H) 05/03/2019 2326   TCO2 31 05/19/2019 2326   ACIDBASEDEF 6.0 (H) 05/14/2019 1636   O2SAT 67.7 05/21/2019 0412    Coagulation Profile: Recent Labs  Lab 05/09/2019 0022 05/02/2019 2127  INR 1.7* 1.5*   Cardiac Enzymes: No results for input(s): CKTOTAL, CKMB, CKMBINDEX, TROPONINI in the last 168 hours. HbA1C: Hgb A1c MFr Bld  Date/Time Value Ref Range Status  05/01/2019 11:25 PM 5.5 4.8 - 5.6 % Final    Comment:    (NOTE) Pre diabetes:          5.7%-6.4% Diabetes:              >6.4% Glycemic control for   <7.0% adults with diabetes    CBG: Recent Labs  Lab 05/20/19 1525 05/20/19 2015 05/21/19 0010 05/21/19 0417 05/21/19 0829  GLUCAP 143* 124* 156* 140* 130*    Critical care time: 40 min.   The patient is critically ill with  multiple organ system failure and requires high complexity decision making for assessment and support, frequent evaluation and titration of therapies, advanced monitoring, review of radiographic studies and interpretation of complex data.   Critical Care Time devoted to patient care services, exclusive of separately billable procedures, described in this note is 35 minutes.   Marshell Garfinkel MD [facility] Pager [phone] If no answer call [phone] 05/21/2019, 11:08 AM

## 2019-05-21 NOTE — Progress Notes (Signed)
Paged Cardiology regarding AM labs. K 3.3, Cr 3.87- rcx 40 mEq replacement yesterday. Also hgb 7.4, plt 36- rcx 1u PRBC yesterday. No overt signs of bleeding, LDH trending downward. MD okay with hgb right now. To be discussed with rounding team. Orders received for 40 mEq potassium.

## 2019-05-21 NOTE — Progress Notes (Addendum)
Advanced Heart Failure Rounding Note   Subjective:    Intubated/sedated.   Underwent placement of Impella 5.0 on 10/24 with removal of R femoral Impella CP.   Remains intubated/sedated. NE increased to 3 mcg + milrinone. 0.25. CO-OX stable.    Follows commands.   Swan #s  PA 41/18  SVR 1145 CVP 13 CI 2.26 CO 6.8  SVR 748  Co-ox 68%  Impella P-8 No flows due to damage of sensor during placement.   RHC/LHC (10/23):  Coronary Findings  Diagnostic Dominance: Right Left Main  75% distal left main stenosis.  Left Anterior Descending  Occluded proximally, some late filling by collaterals.  Ramus Intermedius  Large vessel, patent with luminal irregularities.  Left Circumflex  80% mid LCx stenosis. Small OM1 with 95% proximal stenosis. Large OM2 is subtotally occluded (fills late with TIMI 2 flow).  Right Coronary Artery  80% proximal stenosis. Serial 50% mid-vessel stenoses. PDA with only luminal irregularities. Large PLV with 80% mid-vessel stenosis.  Intervention  No interventions have been documented. Right Heart  Right Heart Pressures RHC Procedural Findings (mmHg): Hemodynamics RA mean 13 RV 70/17 PA 73/41, mean 53 PCWP mean 30 LV 140/40 AO 138/88  Oxygen saturations: PA 60% AO 97%  Cardiac Output (Fick) 2.95  Cardiac Index (Fick) 1.62 PVR 7.8 WU  Cardiac Output (Thermo) 3.24 Cardiac Index (Thermo) 1.78  PVR 7.1 WU  PAPI 2.46    Objective:   Weight Range:  Vital Signs:   Temp:  [92.8 F (33.8 C)-99.3 F (37.4 C)] 97.3 F (36.3 C) (10/27 0730) Pulse Rate:  [81-97] 95 (10/27 0726) Resp:  [6-33] 26 (10/27 0730) BP: (85-123)/(61-86) 114/85 (10/27 0700) SpO2:  [97 %-100 %] 100 % (10/27 0730) Arterial Line BP: (78-129)/(54-83) 115/71 (10/27 0730) FiO2 (%):  [40 %] 40 % (10/27 0726) Weight:  [74.6 kg] 74.6 kg (10/27 0500) Last BM Date: (UTA)  Weight change: Filed Weights   05/19/19 0530 05/20/19 0440 05/21/19 0500  Weight: 71.5 kg  74.5 kg 74.6 kg    Intake/Output:   Intake/Output Summary (Last 24 hours) at 05/21/2019 0738 Last data filed at 05/21/2019 0700 Gross per 24 hour  Intake 2914.02 ml  Output 1415 ml  Net 1499.02 ml     Physical Exam: CVP 8-9  General:  Intubated  No resp difficulty HEENT: normal Neck: supple. JVP 7-8 . Carotids 2+ bilat; no bruits. No lymphadenopathy or thryomegaly appreciated. Cor: PMI nondisplaced. Regular rate & rhythm. No rubs, gallops or murmurs. R axillary impella  Lungs: coarse throughout  Abdomen: soft, nontender, nondistended. No hepatosplenomegaly. No bruits or masses. Good bowel sounds. Extremities: no cyanosis, clubbing, rash, edema Neuro:intubated. Awake follows commands.   Telemetry: Sr 90-100s with frequent PVCs.    Labs: Basic Metabolic Panel: Recent Labs  Lab 05/21/2019 0350 05/21/2019 1200  05/14/2019 1610  05/13/2019 2001 05/11/2019 2127 05/05/2019 2326 05/19/19 0434 05/20/19 0637 05/21/19 0406  NA 137 138   < >  --    < > 138 137 136 138 137 136  K 4.3 3.6   < >  --    < > 3.5 3.4* 3.5 3.6 3.3* 3.3*  CL 98 97*  --   --    < > 97* 98  --  102 104 106  CO2 27 27  --   --   --   --  25  --  25 22 20*  GLUCOSE 153* 147*  --   --    < >  135* 148*  --  168* 176* 133*  BUN 43* 48*  --   --    < > 46* 51*  --  51* 56* 58*  CREATININE 2.79* 3.05*  --   --    < > 3.20* 3.43*  --  3.49* 3.90* 3.87*  CALCIUM 8.4* 8.4*  --   --   --   --  8.4*  --  7.8* 7.5* 7.9*  MG 2.5*  --   --  2.3  --   --   --   --  2.0 2.0 2.1  PHOS 4.0  --   --  3.8  --   --   --   --  3.7 3.4 2.5   < > = values in this interval not displayed.    Liver Function Tests: Recent Labs  Lab 05/13/2019 1200 04/29/2019 2127 05/19/19 0434 05/20/19 0637 05/21/19 0406  AST 130* 118* 64* 27 22  ALT '30 24 22 16 12  ' ALKPHOS 63 61 53 48 56  BILITOT 2.7* 1.8* 1.1 1.0 1.1  PROT 5.7* 5.3* 4.8* 4.8* 4.7*  ALBUMIN 2.7* 2.5* 2.2* 2.2* 2.2*   No results for input(s): LIPASE, AMYLASE in the last 168 hours.  No results for input(s): AMMONIA in the last 168 hours.  CBC: Recent Labs  Lab 04/30/2019 1834  05/23/2019 0900  04/28/2019 1504  05/05/2019 0350  05/23/2019 2127 05/25/2019 2326 05/19/19 0434 05/20/19 0637 05/21/19 0406  WBC 19.6*   < > 20.8*  --  15.9*  --  19.0*  --  22.9*  --  23.0* 22.8* 13.4*  NEUTROABS 17.1*  --  17.8*  --  13.3*  --   --   --  17.4*  --   --   --   --   HGB 17.2*   < > 16.3   < > 14.7   < > 13.2   < > 11.7* 10.9* 9.5* 7.9* 7.4*  HCT 53.3*   < > 51.1   < > 45.8   < > 38.6*   < > 33.3* 32.0* 27.8* 23.4* 22.4*  MCV 94.3   < > 93.1  --  94.2  --  88.9  --  87.4  --  88.8 90.7 91.1  PLT 213   < > 204  --  149*  --  139*  --  120*  --  89* 66* 36*   < > = values in this interval not displayed.    Cardiac Enzymes: No results for input(s): CKTOTAL, CKMB, CKMBINDEX, TROPONINI in the last 168 hours.  BNP: BNP (last 3 results) Recent Labs    05/09/2019 1739 05/25/2019 1234  BNP 2,725.3* 3,370.0*    ProBNP (last 3 results) No results for input(s): PROBNP in the last 8760 hours.    Other results:  Imaging: Dg Chest Port 1 View  Result Date: 05/21/2019 CLINICAL DATA:  Respiratory failure. EXAM: PORTABLE CHEST 1 VIEW COMPARISON:  May 20, 2019. FINDINGS: Endotracheal nasogastric tubes are unchanged in position. Left ventricular assist device is unchanged in position. Right internal jugular Swan-Ganz catheter is noted with tip directed into right pulmonary artery. No pneumothorax or pleural effusion is noted. No acute pulmonary disease is noted. Bony thorax is unremarkable. IMPRESSION: Stable support apparatus. No acute cardiopulmonary abnormality seen. Electronically Signed   By: Marijo Conception M.D.   On: 05/21/2019 07:25   Dg Chest Port 1 View  Result Date: 05/20/2019 CLINICAL DATA:  Endotracheal tube placement EXAM:  PORTABLE CHEST 1 VIEW COMPARISON:  05/20/2019 FINDINGS: Endotracheal tube 6.2 cm above the carina. NG tube extends below the hemidiaphragms into the  stomach with the tip not visualized. Swan-Ganz catheter directed into the right pulmonary artery. Impella ventricular assist device unchanged. Stable heart size. Improvement in the central vascular congestion. No focal airspace process, collapse or consolidation. Negative for edema, large effusion or pneumothorax. IMPRESSION: Stable support apparatus. Improved vascular congestion/mild edema pattern. Electronically Signed   By: Jerilynn Mages.  Shick M.D.   On: 05/20/2019 20:56   Dg Chest Port 1 View  Result Date: 05/20/2019 CLINICAL DATA:  Endotracheal tube placement, respiratory failure. EXAM: PORTABLE CHEST 1 VIEW COMPARISON:  05/19/2019 and 05/09/2019 FINDINGS: Endotracheal tube tip 4.8 cm above the carina. Enteric tube courses into the stomach and off the film as tip is not visualized. Right IJ Swan-Ganz catheter unchanged with tip over the distal right main pulmonary artery. Right-sided PICC line unchanged with tip over the SVC. Impella ventricular assist device unchanged. Lungs are adequately inflated with subtle stable hazy density over the right upper lung and left infrahilar region. No effusion or pneumothorax. Cardiomediastinal silhouette and remainder of the exam is unchanged. IMPRESSION: Subtle stable hazy density over the right upper lung and left infrahilar region. Tubes and lines as described. Electronically Signed   By: Marin Olp M.D.   On: 05/20/2019 07:44   Dg Chest Port 1 View  Result Date: 05/19/2019 CLINICAL DATA:  Central line placement EXAM: PORTABLE CHEST 1 VIEW COMPARISON:  Chest radiograph from the same day FINDINGS: There has been interval placement of a right internal jugular Swan-Ganz catheter with tip overlying the right pulmonary artery. A right upper extremity peripherally inserted central venous catheter tip overlies the superior vena cava. An Impella left ventricular assist device is unchanged in position. An endotracheal tube terminates in the midthoracic trachea. An enteric tube  terminates in the stomach. The heart size is normal. Vascular calcifications are seen in the aortic arch. The lungs are clear. There is no pleural effusion or pneumothorax. IMPRESSION: 1. Swan-Ganz catheter tip overlies the right pulmonary artery. 2. No pneumothorax. 3. No acute findings in the chest. Electronically Signed   By: Zerita Boers M.D.   On: 05/19/2019 15:23     Medications:     Scheduled Medications: . aspirin  81 mg Per Tube Daily  . atorvastatin  80 mg Per Tube q1800  . chlorhexidine gluconate (MEDLINE KIT)  15 mL Mouth Rinse BID  . Chlorhexidine Gluconate Cloth  6 each Topical Daily  . insulin aspart  1-3 Units Subcutaneous Q4H  . lidocaine-EPINEPHrine  20 mL Infiltration Once  . mouth rinse  15 mL Mouth Rinse 10 times per day  . pantoprazole (PROTONIX) IV  40 mg Intravenous QHS  . potassium chloride  20 mEq Oral Once  . sodium chloride flush  10-40 mL Intracatheter Q12H  . sodium chloride flush  3 mL Intravenous Q12H  . sodium chloride flush  3 mL Intravenous Q12H  . vancomycin variable dose per unstable renal function (pharmacist dosing)   Does not apply See admin instructions    Infusions: . sodium chloride 10 mL/hr at 05/20/19 0910  . sodium chloride 10 mL/hr at 05/21/19 0700  . sodium chloride 10 mL/hr at 05/21/19 0000  . sodium chloride    . ceFEPime (MAXIPIME) IV 2 g (05/20/19 0819)  . dextrose 5 % Impella 5.0 Purge solution    . feeding supplement (VITAL AF 1.2 CAL) 1,000 mL (05/20/19 2000)  .  fentaNYL infusion INTRAVENOUS Stopped (05/21/19 0007)  . impella catheter heparin 50 unit/mL in dextrose 5%    . heparin Stopped (05/05/2019 1642)  . midazolam Stopped (05/20/19 0502)  . milrinone 0.25 mcg/kg/min (05/21/19 0700)  . norepinephrine (LEVOPHED) Adult infusion 3 mcg/min (05/21/19 0700)    PRN Medications: sodium chloride, Place/Maintain arterial line **AND** sodium chloride, sodium chloride, albuterol, docusate, fentaNYL, fentaNYL (SUBLIMAZE) injection,  fentaNYL (SUBLIMAZE) injection, midazolam, midazolam, sodium chloride flush, sodium chloride flush   Assessment/Plan:   1. 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 LM 75%, LAD 100%, LCX 80%, RCA 80% prox - Echo EF 20% RV moderately HK - Impella CP placed 10/23. Switch for Impella 5.0 on 10/24.  Off heparin with possible HIT.   - No flow meter on Impella due to damage to sensor on insertion.  - On milrinone 0.25 NE 3 . Co-ox 68%  - CVP 8-9  - Watch renal function closely - Lactate has cleared - PCT also markedly elevated concerning for PNA - continue vanc/cefepime    2. NSTEMI/CAD - hstrop 16k - Cath 10/23 with severe 3v CAD. LM 75%, LAD 100%, LCX 80%, RCA 80% prox - continue ASA/statin. No b-blocker with shock Discussed with Dr Julien Girt at bedside.  - CABG with Impella 5.0 support likely best option when able  3. AKI - baseline creatinine 1.3., now AKI likely due to ATN from shock. - Creatinine trending up 3.5>3.9>3.9 .  -~ 50 cc urine output    4 Acute Respiratory Failure  -  remains intubated. - CCM appreciated.  - Try to wean .    5. PNA  - PCT 6.85-> 3.64 - WBC 23> 22.8>13.4   - Antibiotics per CCM (vanc/cefepime, now off vancomycin) - CX NGTD  6. LBBB  7. Anemia - Hgb down to 7.4, ?due to hemolysis.  LDH better today.  - Will give 1 unit PRBCs.   8. Thrombocytopenia - Platelets 36K today - HIT panel pending. Stopped heparin for now.   9. PVCs Frequent. Add amio drip.   Length of Stay: 5   Amy Clegg NP-C  05/21/2019, 7:38 AM  Advanced Heart Failure Team Pager [phone] (M-F; 7a - 4p)  Please contact [facility] for night-coverage after hours (4p -7a ) and weekends on [email or web address]  Patient seen with NP, agree with the above note.   Good cardiac output with milrinone 0.25, norepinephrine 3, Impella P8.  Urine is clearing, LDH lower at 499.  CXR clear. He is awake/alert this morning.    General: NAD Neck: JVP 10 cm, no thyromegaly or thyroid nodule.  Lungs: Clear to auscultation bilaterally with normal respiratory effort. CV: Nondisplaced PMI.  Heart regular S1/S2, no S3/S4, no murmur.  1+ ankle edema.   Abdomen: Soft, nontender, no hepatosplenomegaly, no distention.  Skin: Intact without lesions or rashes.  Neurologic: Alert on vent  Extremities: No clubbing or cyanosis.  HEENT: Normal.   Hemodynamics acceptable this morning with good cardiac output on current support: Impella 5.0 at P8, milrinone 0.25, norepinephrine 3.  LDH trending down 499 and urine clearing.  CVP 13 today, renal function appears to have plateaued.  - MAP stable today, can continue to wean norepinephrine, probably to off.  - Limited echo today for Impella position.  - Lasix 60 mg IV x 1 and wean vent.   - Platelets 36K, heparin stopped and will begin bivalirudin.    Possible VAP with RUL density on CXR.  WBCs 23 trending down to 13.  PCT has been elevated.  - Vancomycin stopped, on cefepime.   NSTEMI at admission, aim for CABG when stabilized.    Renal function has stabilized with Impella 5.0 in.  Think AKI likely a combination of cardiogenic shock and later hemolysis with Impella CP. Hemolysis improving, urine clear and LDH 499.   With hgb 7.4 today, will give 1 unit PRBCs.   Thrombocytopenia likely related to Impella/hemolysis but HIT sent and will stop heparin/use bivalirudin.  Hopefully will improve soon.   CRITICAL CARE Performed by: Loralie Champagne  Total critical care time: 45 minutes  Critical care time was exclusive of separately billable procedures and treating other patients.  Critical care was necessary to treat or prevent imminent or life-threatening deterioration.  Critical care was time spent personally by me on the following activities: development of treatment plan with patient and/or surrogate as well as nursing, discussions with consultants, evaluation of patient's  response to treatment, examination of patient, obtaining history from patient or surrogate, ordering and performing treatments and interventions, ordering and review of laboratory studies, ordering and review of radiographic studies, pulse oximetry and re-evaluation of patient's condition.   Loralie Champagne 05/21/2019 8:14 AM

## 2019-05-21 NOTE — Progress Notes (Signed)
Attempted PSV;CPAP.  Pt placed back on full vent support due to increased RR & WOB.

## 2019-05-21 NOTE — Progress Notes (Signed)
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  No Known Allergies  Patient Measurements: Height: '5\' 7"'  (170.2 cm)(measured x3) Weight: 164 lb 7.4 oz (74.6 kg) IBW/kg (Calculated) : 66.1  Vital Signs: Temp: 97.3 F (36.3 C) (10/27 0730) Temp Source: Core (10/27 0400) BP: 114/85 (10/27 0700) Pulse Rate: 95 (10/27 0726)  Labs: Recent Labs    05/14/2019 2127  05/19/19 0434 05/20/19 0353 05/20/19 0637 05/21/19 0406  HGB 11.7*   < > 9.5*  --  7.9* 7.4*  HCT 33.3*   < > 27.8*  --  23.4* 22.4*  PLT 120*  --  89*  --  66* 36*  APTT 111*  --   --   --   --   --   LABPROT 17.7*  --   --   --   --   --   INR 1.5*  --   --   --   --   --   HEPARINUNFRC  --   --   --  <0.10*  --  0.15*  CREATININE 3.43*  --  3.49*  --  3.90* 3.87*   < > = values in this interval not displayed.    Estimated Creatinine Clearance: 16.8 mL/min (A) (by C-G formula based on SCr of 3.87 mg/dL (H)).   Medical History: Past Medical History:  Diagnosis Date  . Acute respiratory failure with hypoxemia ([place]) 04/2019  . Carotid artery occlusion   . Hypertension     Medications:  Scheduled:  . aspirin  81 mg Per Tube Daily  . atorvastatin  80 mg Per Tube q1800  . chlorhexidine gluconate (MEDLINE KIT)  15 mL Mouth Rinse BID  . Chlorhexidine Gluconate Cloth  6 each Topical Daily  . insulin aspart  1-3 Units Subcutaneous Q4H  . lidocaine-EPINEPHrine  20 mL Infiltration Once  . mouth rinse  15 mL Mouth Rinse 10 times per day  . pantoprazole (PROTONIX) IV  40 mg Intravenous QHS  . potassium chloride  20 mEq Oral Once  . sodium chloride flush  10-40 mL Intracatheter Q12H  . sodium chloride flush  3 mL Intravenous Q12H  . sodium chloride flush  3 mL Intravenous Q12H   Infusions:  . sodium chloride 10 mL/hr at 05/20/19 0910  . sodium chloride 10 mL/hr at 05/21/19 0700  . sodium chloride 10 mL/hr at 05/21/19 0000  . sodium chloride     . amiodarone    . amiodarone    . bivalirudin (ANGIOMAX) infusion 0.5 mg/mL (Non-ACS indications)    . dextrose 5 % Impella 5.0 Purge solution    . feeding supplement (VITAL AF 1.2 CAL) 1,000 mL (05/20/19 2000)  . fentaNYL infusion INTRAVENOUS Stopped (05/21/19 0007)  . midazolam Stopped (05/20/19 0502)  . milrinone 0.25 mcg/kg/min (05/21/19 0700)  . norepinephrine (LEVOPHED) Adult infusion 3 mcg/min (05/21/19 0700)    Assessment: 75 yoM admitted with shock and concern for ACS now s/p Impella CP placement in cath lab.  Impella CP swapped out for 5.0 d/t hemolysis. Pltc dropping from 200s baseline down to 36 sec this AM. 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 Purge solution now with D5W only. Pharmacy consulted to start bivalirudin for systemic anticoagulation while HIT panel is pending.   Goal of Therapy:  aPTT 50-85 seconds Monitor platelets by anticoagulation protocol: Yes   Plan:  Bivalirudin 0.05 mg/kg/hr Check 2 hr aPTT Daily aPTT, CBC Monitor for bleeding, HIT panel results  Vertis Kelch, PharmD PGY2 Cardiology Pharmacy Resident Phone [phone] 05/21/2019       7:59 AM  Please check [email or web address] for unit-specific pharmacist phone numbers

## 2019-05-21 NOTE — Progress Notes (Signed)
3 Days Post-Op Procedure(s) (LRB): INSERTION OF IMPELLA 5.0 IMPLANTABLE LEFT VENTRICULAR ASSIST DEVICE THROUGH RIGHT AXILLARY; REMOVAL OF RIGHT GROIN IMPELLA CP (N/A) right femoral artery repair and left heart catheterization Subjective: Responding to verbal, remains intubated  Objective: Vital signs in last 24 hours: Temp:  [92.8 F (33.8 C)-99.3 F (37.4 C)] 97.2 F (36.2 C) (10/27 0700) Pulse Rate:  [81-97] 97 (10/26 2011) Cardiac Rhythm: Normal sinus rhythm;Sinus tachycardia (10/27 0400) Resp:  [6-33] 6 (10/27 0700) BP: (85-123)/(61-86) 114/85 (10/27 0700) SpO2:  [97 %-100 %] 100 % (10/27 0700) Arterial Line BP: (78-129)/(54-83) 114/70 (10/27 0700) FiO2 (%):  [40 %] 40 % (10/27 0436) Weight:  [74.6 kg] 74.6 kg (10/27 0500)  Hemodynamic parameters for last 24 hours: PAP: (31-76)/(14-50) 44/27 CVP:  [3 mmHg-36 mmHg] 11 mmHg PCWP:  [6 mmHg-15 mmHg] 6 mmHg CO:  [5 L/min-7.1 L/min] 6.8 L/min CI:  [2.8 L/min/m2-3.9 L/min/m2] 3.8 L/min/m2  Intake/Output from previous day: 10/26 0701 - 10/27 0700 In: 2895 [I.V.:683.5; Blood:315; NG/GT:1465] Out: 2355 [Urine:1415] Intake/Output this shift: No intake/output data recorded.  General appearance: alert and no distress Neurologic: intact Heart: regular rate and rhythm, S1, S2 normal, no murmur, click, rub or gallop Lungs: clear to auscultation bilaterally Extremities: edema RUE >LUE Wound: stable hematoma  Lab Results: Recent Labs    05/20/19 0637 05/21/19 0406  WBC 22.8* 13.4*  HGB 7.9* 7.4*  HCT 23.4* 22.4*  PLT 66* 36*   BMET:  Recent Labs    05/20/19 0637 05/21/19 0406  NA 137 136  K 3.3* 3.3*  CL 104 106  CO2 22 20*  GLUCOSE 176* 133*  BUN 56* 58*  CREATININE 3.90* 3.87*  CALCIUM 7.5* 7.9*    PT/INR:  Recent Labs    05/07/2019 2127  LABPROT 17.7*  INR 1.5*   ABG    Component Value Date/Time   PHART 7.480 (H) 05/04/2019 2326   HCO3 29.7 (H) 04/28/2019 2326   TCO2 31 05/07/2019 2326   ACIDBASEDEF  6.0 (H) 05/02/2019 1636   O2SAT 67.7 05/21/2019 0412   CBG (last 3)  Recent Labs    05/20/19 2015 05/21/19 0010 05/21/19 0417  GLUCAP 124* 156* 140*    Assessment/Plan: S/P Procedure(s) (LRB): INSERTION OF IMPELLA 5.0 IMPLANTABLE LEFT VENTRICULAR ASSIST DEVICE THROUGH RIGHT AXILLARY; REMOVAL OF RIGHT GROIN IMPELLA CP (N/A) right femoral artery repair and left heart catheterization Most concerning is precipitous platelet drop; HIT panel sent--heparin in purge stopped. Will likely start argatroban pending HIT review.   LOS: 5 days    Wonda Olds 05/21/2019

## 2019-05-21 NOTE — Progress Notes (Signed)
  RUE 3+ Edema. Check [place] RUE now.   Jasmaine Rochel NP-C 4:21 PM

## 2019-05-22 ENCOUNTER — Inpatient Hospital Stay (HOSPITAL_COMMUNITY): Payer: PPO

## 2019-05-22 DIAGNOSIS — Z95811 Presence of heart assist device: Secondary | ICD-10-CM

## 2019-05-22 DIAGNOSIS — J9601 Acute respiratory failure with hypoxia: Secondary | ICD-10-CM | POA: Diagnosis not present

## 2019-05-22 LAB — BASIC METABOLIC PANEL
Anion gap: 11 (ref 5–15)
Anion gap: 12 (ref 5–15)
BUN: 58 mg/dL — ABNORMAL HIGH (ref 8–23)
BUN: 61 mg/dL — ABNORMAL HIGH (ref 8–23)
CO2: 22 mmol/L (ref 22–32)
CO2: 22 mmol/L (ref 22–32)
Calcium: 8 mg/dL — ABNORMAL LOW (ref 8.9–10.3)
Calcium: 8.2 mg/dL — ABNORMAL LOW (ref 8.9–10.3)
Chloride: 106 mmol/L (ref 98–111)
Chloride: 105 mmol/L (ref 98–111)
Creatinine, Ser: 3.55 mg/dL — ABNORMAL HIGH (ref 0.61–1.24)
Creatinine, Ser: 3.85 mg/dL — ABNORMAL HIGH (ref 0.61–1.24)
GFR calc Af Amer: 19 mL/min — ABNORMAL LOW (ref >60)
GFR calc Af Amer: 17 mL/min — ABNORMAL LOW (ref >60)
GFR calc non Af Amer: 17 mL/min — ABNORMAL LOW (ref >60)
GFR calc non Af Amer: 15 mL/min — ABNORMAL LOW (ref >60)
Glucose, Bld: 145 mg/dL — ABNORMAL HIGH (ref 70–99)
Glucose, Bld: 126 mg/dL — ABNORMAL HIGH (ref 70–99)
Potassium: 3.5 mmol/L (ref 3.5–5.1)
Potassium: 4.2 mmol/L (ref 3.5–5.1)
Sodium: 139 mmol/L (ref 135–145)
Sodium: 139 mmol/L (ref 135–145)

## 2019-05-22 LAB — POCT I-STAT 7, (LYTES, BLD GAS, ICA,H+H)
Acid-base deficit: 6 mmol/L — ABNORMAL HIGH (ref 0.0–2.0)
Acid-base deficit: 5 mmol/L — ABNORMAL HIGH (ref 0.0–2.0)
Acid-base deficit: 3 mmol/L — ABNORMAL HIGH (ref 0.0–2.0)
Bicarbonate: 22.2 mmol/L (ref 20.0–28.0)
Bicarbonate: 20.9 mmol/L (ref 20.0–28.0)
Bicarbonate: 23.3 mmol/L (ref 20.0–28.0)
Calcium, Ion: 1.19 mmol/L (ref 1.15–1.40)
Calcium, Ion: 1.14 mmol/L — ABNORMAL LOW (ref 1.15–1.40)
Calcium, Ion: 1.21 mmol/L (ref 1.15–1.40)
HCT: 27 % — ABNORMAL LOW (ref 39.0–52.0)
HCT: 22 % — ABNORMAL LOW (ref 39.0–52.0)
HCT: 25 % — ABNORMAL LOW (ref 39.0–52.0)
Hemoglobin: 9.2 g/dL — ABNORMAL LOW (ref 13.0–17.0)
Hemoglobin: 7.5 g/dL — ABNORMAL LOW (ref 13.0–17.0)
Hemoglobin: 8.5 g/dL — ABNORMAL LOW (ref 13.0–17.0)
O2 Saturation: 99 %
O2 Saturation: 100 %
O2 Saturation: 99 %
Patient temperature: 37.2
Patient temperature: 36.5
Patient temperature: 36.8
Potassium: 3.5 mmol/L (ref 3.5–5.1)
Potassium: 3.2 mmol/L — ABNORMAL LOW (ref 3.5–5.1)
Potassium: 3.4 mmol/L — ABNORMAL LOW (ref 3.5–5.1)
Sodium: 140 mmol/L (ref 135–145)
Sodium: 141 mmol/L (ref 135–145)
Sodium: 141 mmol/L (ref 135–145)
TCO2: 24 mmol/L (ref 22–32)
TCO2: 22 mmol/L (ref 22–32)
TCO2: 25 mmol/L (ref 22–32)
pCO2 arterial: 54.4 mmHg — ABNORMAL HIGH (ref 32.0–48.0)
pCO2 arterial: 38.9 mmHg (ref 32.0–48.0)
pCO2 arterial: 49.5 mmHg — ABNORMAL HIGH (ref 32.0–48.0)
pH, Arterial: 7.219 — ABNORMAL LOW (ref 7.350–7.450)
pH, Arterial: 7.336 — ABNORMAL LOW (ref 7.350–7.450)
pH, Arterial: 7.279 — ABNORMAL LOW (ref 7.350–7.450)
pO2, Arterial: 166 mmHg — ABNORMAL HIGH (ref 83.0–108.0)
pO2, Arterial: 183 mmHg — ABNORMAL HIGH (ref 83.0–108.0)
pO2, Arterial: 183 mmHg — ABNORMAL HIGH (ref 83.0–108.0)

## 2019-05-22 LAB — BODY FLUID CELL COUNT WITH DIFFERENTIAL
Eos, Fluid: 0 %
Lymphs, Fluid: 3 %
Monocyte-Macrophage-Serous Fluid: 21 % — ABNORMAL LOW (ref 50–90)
Neutrophil Count, Fluid: 76 % — ABNORMAL HIGH (ref 0–25)
Total Nucleated Cell Count, Fluid: 76 cu mm (ref 0–1000)

## 2019-05-22 LAB — CBC
HCT: 28.3 % — ABNORMAL LOW (ref 39.0–52.0)
Hemoglobin: 9 g/dL — ABNORMAL LOW (ref 13.0–17.0)
MCH: 28.6 pg (ref 26.0–34.0)
MCHC: 31.8 g/dL (ref 30.0–36.0)
MCV: 89.8 fL (ref 80.0–100.0)
Platelets: 30 10*3/uL — ABNORMAL LOW (ref 150–400)
RBC: 3.15 MIL/uL — ABNORMAL LOW (ref 4.22–5.81)
RDW: 16.3 % — ABNORMAL HIGH (ref 11.5–15.5)
WBC: 10.3 10*3/uL (ref 4.0–10.5)
nRBC: 0.4 % — ABNORMAL HIGH (ref 0.0–0.2)

## 2019-05-22 LAB — ECHOCARDIOGRAM LIMITED
Height: 67 in
Height: 67 in
Weight: 2627.88 oz
Weight: 2627.88 oz

## 2019-05-22 LAB — BPAM RBC
Blood Product Expiration Date: 202011282359
Blood Product Expiration Date: 202011302359
ISSUE DATE / TIME: 202010260841
ISSUE DATE / TIME: 202010270958
Unit Type and Rh: 5100
Unit Type and Rh: 5100

## 2019-05-22 LAB — TYPE AND SCREEN
ABO/RH(D): O POS
Antibody Screen: NEGATIVE
Unit division: 0
Unit division: 0

## 2019-05-22 LAB — LACTATE DEHYDROGENASE: LDH: 538 U/L — ABNORMAL HIGH (ref 98–192)

## 2019-05-22 LAB — COOXEMETRY PANEL
Carboxyhemoglobin: 1.2 % (ref 0.5–1.5)
Methemoglobin: 0.7 % (ref 0.0–1.5)
O2 Saturation: 88.1 %
Total hemoglobin: 8.2 g/dL — ABNORMAL LOW (ref 12.0–16.0)

## 2019-05-22 LAB — HEPARIN INDUCED PLATELET AB (HIT ANTIBODY): Heparin Induced Plt Ab: 0.048 OD (ref 0.000–0.400)

## 2019-05-22 LAB — GLUCOSE, CAPILLARY
Glucose-Capillary: 77 mg/dL (ref 70–99)
Glucose-Capillary: 132 mg/dL — ABNORMAL HIGH (ref 70–99)
Glucose-Capillary: 100 mg/dL — ABNORMAL HIGH (ref 70–99)
Glucose-Capillary: 108 mg/dL — ABNORMAL HIGH (ref 70–99)
Glucose-Capillary: 157 mg/dL — ABNORMAL HIGH (ref 70–99)
Glucose-Capillary: 134 mg/dL — ABNORMAL HIGH (ref 70–99)

## 2019-05-22 LAB — APTT
aPTT: 73 seconds — ABNORMAL HIGH (ref 24–36)
aPTT: 78 seconds — ABNORMAL HIGH (ref 24–36)

## 2019-05-22 MED ORDER — FUROSEMIDE 10 MG/ML IJ SOLN
INTRAMUSCULAR | Status: AC
  Filled 2019-05-22: qty 8

## 2019-05-22 MED ORDER — METOLAZONE 2.5 MG PO TABS
2.5000 mg | ORAL_TABLET | Freq: Once | ORAL | Status: AC
  Administered 2019-05-22: 2.5 mg via ORAL
  Filled 2019-05-22: qty 1

## 2019-05-22 MED ORDER — FUROSEMIDE 10 MG/ML IJ SOLN
12.0000 mg/h | INTRAVENOUS | Status: DC
  Administered 2019-05-22: 8 mg/h via INTRAVENOUS
  Administered 2019-05-23: 12 mg/h via INTRAVENOUS
  Filled 2019-05-22 (×2): qty 25

## 2019-05-22 MED ORDER — VANCOMYCIN VARIABLE DOSE PER UNSTABLE RENAL FUNCTION (PHARMACIST DOSING): Status: DC

## 2019-05-22 MED ORDER — VANCOMYCIN HCL IN DEXTROSE 1-5 GM/200ML-% IV SOLN
1000.0000 mg | Freq: Once | INTRAVENOUS | Status: AC
  Administered 2019-05-22: 1000 mg via INTRAVENOUS
  Filled 2019-05-22: qty 200

## 2019-05-22 MED ORDER — MIDAZOLAM HCL 2 MG/2ML IJ SOLN
2.0000 mg | Freq: Once | INTRAMUSCULAR | Status: AC
  Administered 2019-05-22: 11:00:00 2 mg via INTRAVENOUS

## 2019-05-22 MED ORDER — SODIUM CHLORIDE 0.9 % IV SOLN
2.0000 g | INTRAVENOUS | Status: DC
  Filled 2019-05-22 (×2): qty 2

## 2019-05-22 MED ORDER — ETOMIDATE 2 MG/ML IV SOLN
20.0000 mg | Freq: Once | INTRAVENOUS | Status: AC
  Administered 2019-05-22: 11:00:00 20 mg via INTRAVENOUS

## 2019-05-22 MED ORDER — IPRATROPIUM-ALBUTEROL 0.5-2.5 (3) MG/3ML IN SOLN
RESPIRATORY_TRACT | Status: AC
  Administered 2019-05-24: 14:00:00 3 mL via RESPIRATORY_TRACT
  Filled 2019-05-22: qty 3

## 2019-05-22 MED ORDER — VITAL AF 1.2 CAL PO LIQD
1000.0000 mL | ORAL | Status: DC
  Administered 2019-05-22 – 2019-05-23 (×2): 1000 mL

## 2019-05-22 MED ORDER — ETOMIDATE 2 MG/ML IV SOLN
INTRAVENOUS | Status: AC
  Filled 2019-05-22: qty 10

## 2019-05-22 MED ORDER — IPRATROPIUM BROMIDE 0.02 % IN SOLN
0.5000 mg | Freq: Four times a day (QID) | RESPIRATORY_TRACT | Status: DC
  Administered 2019-05-22 – 2019-05-26 (×15): 0.5 mg via RESPIRATORY_TRACT
  Filled 2019-05-22 (×15): qty 2.5

## 2019-05-22 MED ORDER — METHYLPREDNISOLONE SODIUM SUCC 40 MG IJ SOLR
40.0000 mg | Freq: Two times a day (BID) | INTRAMUSCULAR | Status: DC
  Administered 2019-05-22 – 2019-05-24 (×6): 40 mg via INTRAVENOUS
  Filled 2019-05-22 (×6): qty 1

## 2019-05-22 MED ORDER — ARFORMOTEROL TARTRATE 15 MCG/2ML IN NEBU
15.0000 ug | INHALATION_SOLUTION | Freq: Two times a day (BID) | RESPIRATORY_TRACT | Status: DC
  Administered 2019-05-22 – 2019-06-07 (×33): 15 ug via RESPIRATORY_TRACT
  Filled 2019-05-22 (×40): qty 2

## 2019-05-22 MED ORDER — MIDAZOLAM HCL 2 MG/2ML IJ SOLN
2.0000 mg | Freq: Once | INTRAMUSCULAR | Status: AC
  Administered 2019-05-22: 11:00:00 2 mg via INTRAVENOUS
  Filled 2019-05-22: qty 2

## 2019-05-22 MED ORDER — POTASSIUM CHLORIDE 10 MEQ/50ML IV SOLN
10.0000 meq | INTRAVENOUS | Status: AC
  Administered 2019-05-22 (×4): 10 meq via INTRAVENOUS
  Filled 2019-05-22 (×4): qty 50

## 2019-05-22 MED ORDER — BUDESONIDE 0.5 MG/2ML IN SUSP
0.5000 mg | Freq: Two times a day (BID) | RESPIRATORY_TRACT | Status: DC
  Administered 2019-05-22 – 2019-05-26 (×10): 0.5 mg via RESPIRATORY_TRACT
  Filled 2019-05-22 (×12): qty 2

## 2019-05-22 MED ORDER — IPRATROPIUM-ALBUTEROL 0.5-2.5 (3) MG/3ML IN SOLN
3.0000 mL | RESPIRATORY_TRACT | Status: DC | PRN
  Administered 2019-05-22 – 2019-05-29 (×4): 3 mL via RESPIRATORY_TRACT
  Filled 2019-05-22 (×3): qty 3

## 2019-05-22 MED ORDER — FUROSEMIDE 10 MG/ML IJ SOLN
80.0000 mg | Freq: Once | INTRAMUSCULAR | Status: AC
  Administered 2019-05-22: 08:00:00 80 mg via INTRAVENOUS

## 2019-05-22 NOTE — Progress Notes (Signed)
Called to the bedside per MD Eliezer Bottom to make mechanical ventilatory changes. MD camera into patient room. Patient is desynchronize w/ the vent and appears to be struggling to breathe. ABG is acidotic. Patient is breathing over the set rate and BBS to auscultation reveals TIGHT, decrease aeration and airflow limitation throughout. Suggested a Continuous Alb treatment to possibly get better air movement and to maybe decrease PIP and MD disagreed per K values.   Intervention that were made per MD [facility] wants a PRN Duoneb and to change the patient to PCV 25 in which patient is not getting adequate minute ventilation.  Patient minute ventilation has decrease with the changes that MD [facility] wants to implement and considering patient Respiratory Acidosis. CXR pending.  RRT is at bedside and will continue to monitor patient clinical presentation.

## 2019-05-22 NOTE — Progress Notes (Addendum)
NAME:  Raymond Chavez, MRN:  [id number], DOB:  21-Jan-1951, LOS: 6 ADMISSION DATE:  05/24/2019, CONSULTATION DATE:  05/13/2019 REFERRING MD:  Dalene Seltzer  CHIEF COMPLAINT:  SOB   Brief History   Raymond Chavez is a 68 y.o. male who was admitted with acute hypoxic respiratory failure due to acute pulmonary edema in setting of NSTEMI, cardiogenic shock, possible PNA, He required intubation in the ED after failing BiPAP. On impella for hemodynamic support CVTS and cardiology are discussing about CABG, LVAD  Past Medical History  HTN, CAD.  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  10/27-heparin stopped due to low platelets.  HIT panel sent.  Starting bivalirudin.  Off Levophed.  On minimal vent support but failed weaning trials  Consults:  Cardiology PCCM Nephrology   Procedures:  ETT 10/22 >  [place] 10/22 > Impella 10/23 >> 10/24 Changed to 5.0 via right axillary >>   Significant Diagnostic Tests:  CTA chest 10/22 > no PE, b/l basilar consolidation, diffuse interlobular septal thickening with GGO's, b/l hilar adenopathy. Echo 10/23 > severely decreased LV function.  Cannot estimate EF  Micro Data:  Blood 10/22, 10/24>>> Sputum 10/22 >>>  Urine 10/22 >>> Urine Strep > Negative  RVP 10/22 > Neg MRSA PCR > Neg SARS CoV2 10/22 > neg.  Antimicrobials:  Vanc 10/22 > 10/25 Cefepime 10/22 >    Interim history/subjective:  Increasing resp distress overnight. ABG with resp acidosis Vent changes made with improvement in ABG but pt still in distress CXR looks ok.   Objective:  Blood pressure 123/86, pulse 84, temperature 97.9 F (36.6 C), resp. rate (!) 30, height 5\' 7"  (1.702 m), weight 74.5 kg, SpO2 100 %. PAP: (38-80)/(6-47) 61/33 CVP:  [0 mmHg-17 mmHg] 13 mmHg CO:  [5.3 L/min-5.8 L/min] 5.3 L/min CI:   [2.9 L/min/m2-3.2 L/min/m2] 2.9 L/min/m2  Vent Mode: PRVC FiO2 (%):  [40 %] 40 % Set Rate:  [18 bmp] 18 bmp Vt Set:  [450 mL] 450 mL PEEP:  [5 cmH20] 5 cmH20 Plateau Pressure:  [24 cmH20-33 cmH20] 30 cmH20   Intake/Output Summary (Last 24 hours) at 05/22/2019 0811 Last data filed at 05/22/2019 0700 Gross per 24 hour  Intake 5261.41 ml  Output 4290 ml  Net 971.41 ml   Filed Weights   05/20/19 0440 05/21/19 0500 05/22/19 0500  Weight: 74.5 kg 74.6 kg 74.5 kg    Examination: Blood pressure 123/86, pulse 84, temperature 97.9 F (36.6 C), resp. rate (!) 30, height 5\' 7"  (1.702 m), weight 74.5 kg, SpO2 100 %. Gen:      No acute distress HEENT:  EOMI, sclera anicteric, ETT Neck:     No masses; no thyromegaly Lungs:    Clear to auscultation bilaterally; normal respiratory effort CV:         Regular rate and rhythm; no murmurs Abd:      + bowel sounds; soft, non-tender; no palpable masses, no distension Ext:    No edema; adequate peripheral perfusion Skin:      Warm and dry; no rash Neuro:Sedated  Assessment & Plan:   Acute hypoxic respiratory failure requiring intubation - multifactorial due to PNA, acute pulmonary edema in setting of Cardiogenic Shock  Plan  May be having more bronchospasm. Not getting good TVs with high peak pressures Will start brovana,  pulmicort, standing atrovent Consider bronch to eval for endobronchial mucus obstruction. If no endobronchial blockage then start steroids for bronchospasm.  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  Hold preadmission amlodipine, atorvastatin, HCTZ, lisinopril.  Acute Kidney Injury  Creatinine improved today, urine output is okay.  Hyponatremia - presumed hypervolemic in setting underlying CHF. >>> Improved  NAGMA. >> Improved  Plan Nephrology following Continue supportive care.  No indication for hemodialysis yet as he is making good urine  Thrombocytopenia HIT panel pending. On bivaluridin now  Best Practice:  Diet: Tube feeds Pain/Anxiety/Delirium protocol: Fentanyl gtt / Versed   PRN.  RASS goal 0 to -1. VAP protocol: Yes.  DVT prophylaxis: SCD's /bivalirudin GI prophylaxis: PPI. Glucose control: SSI. Mobility: Bedrest. Code Status: Full. Family Communication: Daughter updated Disposition: ICU.  Labs   CBC: Recent Labs  Lab 05/11/2019 1834  04/27/2019 0900  04/30/2019 1504  05/01/2019 2127  05/19/19 0434 05/20/19 0637  05/21/19 0406 05/21/19 1710 05/22/19 0113 05/22/19 0225 05/22/19 0439  WBC 19.6*   < > 20.8*  --  15.9*   < > 22.9*  --  23.0* 22.8*  --  13.4*  --   --  10.3  --   NEUTROABS 17.1*  --  17.8*  --  13.3*  --  17.4*  --   --   --   --   --   --   --   --   --   HGB 17.2*   < > 16.3   < > 14.7   < > 11.7*   < > 9.5* 7.9*   < > 7.4* 9.5* 9.2* 9.0* 7.5*  HCT 53.3*   < > 51.1   < > 45.8   < > 33.3*   < > 27.8* 23.4*   < > 22.4* 28.0* 27.0* 28.3* 22.0*  MCV 94.3   < > 93.1  --  94.2   < > 87.4  --  88.8 90.7  --  91.1  --   --  89.8  --   PLT 213   < > 204  --  149*   < > 120*  --  89* 66*  --  36*  --   --  30*  --    < > = values in this interval not displayed.   Basic Metabolic Panel: Recent Labs  Lab 04/28/2019 0350  04/25/2019 1610  05/02/2019 2127  05/19/19 0434 05/20/19 0637  05/21/19 0406 05/21/19 1710 05/22/19 0113 05/22/19 0225 05/22/19 0439  NA 137   < >  --    < > 137   < > 138 137   < > 136 139 140 139 141  K 4.3   < >  --    < > 3.4*   < > 3.6 3.3*   < > 3.3* 3.3* 3.5 3.5 3.2*  CL 98   < >  --    < > 98  --  102 104  --  106  --   --   106  --   CO2 27   < >  --   --  25  --  25 22  --  20*  --   --  22  --   GLUCOSE 153*   < >  --    < > 148*  --  168* 176*  --  133*  --   --  145*  --   BUN 43*   < >  --    < > 51*  --  51* 56*  --  58*  --   --  58*  --   CREATININE 2.79*   < >  --    < > 3.43*  --  3.49* 3.90*  --  3.87*  --   --  3.55*  --   CALCIUM 8.4*   < >  --   --  8.4*  --  7.8* 7.5*  --  7.9*  --   --  8.0*  --   MG 2.5*  --  2.3  --   --   --  2.0 2.0  --  2.1  --   --   --   --   PHOS 4.0  --  3.8  --   --   --  3.7 3.4  --  2.5  --   --   --   --    < > = values in this interval not displayed.   GFR: Estimated Creatinine Clearance: 18.4 mL/min (A) (by C-G formula based on SCr of 3.55 mg/dL (H)). Recent Labs  Lab 05/12/2019 0900  05/11/2019 1752 05/08/2019 0350 05/19/2019 0448 05/23/2019 1200  05/22/2019 2327 05/19/19 0434 05/20/19 0637 05/21/19 0406 05/21/19 1045 05/22/19 0225  PROCALCITON 5.55  --   --  6.85  --   --   --   --  3.64  --   --  0.98  --   WBC 20.8*   < >  --  19.0*  --   --    < >  --  23.0* 22.8* 13.4*  --  10.3  LATICACIDVEN  --    < > 2.1*  --  2.1* 1.4  --  1.6  --   --   --   --   --    < > = values in this interval not displayed.   Liver Function Tests: Recent Labs  Lab 05/25/2019 1200 05/23/2019 2127 05/19/19 0434 05/20/19 0637 05/21/19 0406  AST 130* 118* 64* 27 22  ALT 30 24 22 16 12   ALKPHOS 63 61 53 48 56  BILITOT 2.7* 1.8* 1.1 1.0 1.1  PROT 5.7* 5.3* 4.8* 4.8* 4.7*  ALBUMIN 2.7* 2.5* 2.2* 2.2* 2.2*   No results for input(s): LIPASE, AMYLASE in the last 168 hours. No results for input(s): AMMONIA in the last 168 hours. ABG    Component Value Date/Time   PHART 7.336 (L) 05/22/2019 0439   PCO2ART 38.9 05/22/2019 0439   PO2ART 183.0 (H) 05/22/2019 0439   HCO3 20.9 05/22/2019 0439   TCO2 22 05/22/2019 0439   ACIDBASEDEF 5.0 (H) 05/22/2019 0439   O2SAT 88.1 05/22/2019 0555    Coagulation Profile: Recent Labs  Lab 05/12/2019 0022 05/12/2019 2127  INR 1.7* 1.5*    Cardiac Enzymes: No results for input(s): CKTOTAL, CKMB, CKMBINDEX, TROPONINI in the last 168 hours. HbA1C: Hgb A1c MFr Bld  Date/Time Value Ref Range Status  05/04/2019 11:25 PM 5.5 4.8 - 5.6 % Final    Comment:    (NOTE) Pre diabetes:  5.7%-6.4% Diabetes:              >6.4% Glycemic control for   <7.0% adults with diabetes    CBG: Recent Labs  Lab 05/21/19 1216 05/21/19 1635 05/21/19 2028 05/22/19 0101 05/22/19 0437  GLUCAP 129* 132* 102* 77 132*    Critical care time: 40 min.   The patient is critically ill with multiple organ system failure and requires high complexity decision making for assessment and support, frequent evaluation and titration of therapies, advanced monitoring, review of radiographic studies and interpretation of complex data.   Critical Care Time devoted to patient care services, exclusive of separately billable procedures, described in this note is 35 minutes.   Marshell Garfinkel MD [facility] Pager [phone] If no answer call [phone] 05/22/2019, 8:11 AM

## 2019-05-22 NOTE — Progress Notes (Signed)
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  No Known Allergies  Patient Measurements: Height: _0  (170.2 cm)(measured x3) Weight: 164 lb 3.9 oz (74.5 kg) IBW/kg (Calculated) : 66.1  Vital Signs: Temp: 97.9 F (36.6 C) (10/28 0700) BP: 123/86 (10/28 0600) Pulse Rate: 84 (10/28 0211)  Labs: Recent Labs    05/20/19 0353 05/20/19 1751  05/21/19 0406 05/21/19 1045  05/21/19 2047 05/22/19 0113 05/22/19 0225 05/22/19 0439 05/22/19 0447  HGB  --  7.9*   < > 7.4*  --    < >  --  9.2* 9.0* 7.5*  --   HCT  --  23.4*   < > 22.4*  --    < >  --  27.0* 28.3* 22.0*  --   PLT  --  66*  --  36*  --   --   --   --  30*  --   --   APTT  --   --   --   --  65*  --  69*  --   --   --  73*  HEPARINUNFRC <0.10*  --   --  0.15*  --   --   --   --   --   --   --   CREATININE  --  3.90*  --  3.87*  --   --   --   --  3.55*  --   --    < > = values in this interval not displayed.    Estimated Creatinine Clearance: 18.4 mL/min (A) (by C-G formula based on SCr of 3.55 mg/dL (H)).   Medical History: Past Medical History:  Diagnosis Date  . Acute respiratory failure with hypoxemia ([place]) 04/2019  . Carotid artery occlusion   . Hypertension     Medications:  Scheduled:  . aspirin  81 mg Per Tube Daily  . atorvastatin  80 mg Per Tube q1800  . chlorhexidine gluconate (MEDLINE KIT)  15 mL Mouth Rinse BID  . Chlorhexidine Gluconate Cloth  6 each Topical Daily  . insulin aspart  1-3 Units Subcutaneous Q4H  . lidocaine-EPINEPHrine  20 mL Infiltration Once  . mouth rinse  15 mL Mouth Rinse 10 times per day  . pantoprazole (PROTONIX) IV  40 mg Intravenous QHS  . potassium chloride  20 mEq Oral Once  . sodium chloride flush  10-40 mL Intracatheter Q12H  . sodium chloride flush  3 mL Intravenous Q12H  . sodium chloride flush  3 mL Intravenous Q12H   Infusions:  . sodium chloride 10 mL/hr at 05/20/19 0910  . sodium  chloride 10 mL/hr at 05/22/19 0600  . sodium chloride 10 mL/hr at 05/22/19 0600  . sodium chloride    . amiodarone 30 mg/hr (05/22/19 0600)  . bivalirudin (ANGIOMAX) infusion 0.5 mg/mL (Non-ACS indications) 0.05 mg/kg/hr (05/22/19 0600)  . dexmedetomidine (PRECEDEX) IV infusion Stopped (05/22/19 0359)  . dextrose 5 % Impella 5.0 Purge solution    . feeding supplement (VITAL AF 1.2 CAL) 1,000 mL (05/20/19 2000)  . fentaNYL infusion INTRAVENOUS 25 mcg/hr (05/22/19 0600)  . midazolam Stopped (05/20/19 0502)  . milrinone 0.25 mcg/kg/min (05/22/19 0600)  . norepinephrine (LEVOPHED) Adult infusion Stopped (05/22/19 0518)    Assessment: 48 yoM admitted with shock and concern for ACS now s/p Impella CP placement in cath lab.  Impella CP swapped out for 5.0 d/t hemolysis. LDH up 538. Pltc dropping from  200s baseline down to 30 sec this AM. Heparin stopped on 10/27 with concern for HIT. Purge solution now with D5W only, running at 17.1 ml/hr. Pharmacy consulted to start bivalirudin for systemic anticoagulation while HIT panel is pending.   aPTT is within goal at 73 sec. No bleeding issues noted.   Goal of Therapy:  aPTT 50-85 seconds Monitor platelets by anticoagulation protocol: Yes   Plan:  Continue bivalirudin at 0.05 mg/kg/hr Will check 5A/5P aPTT in case of accumulation in the setting of renal impairment for now Daily aPTT, CBC Monitor for bleeding, HIT panel results  Vertis Kelch, PharmD PGY2 Cardiology Pharmacy Resident Phone [phone] 05/22/2019       7:21 AM  Please check [email or web address] for unit-specific pharmacist phone numbers

## 2019-05-22 NOTE — Progress Notes (Signed)
Upon initial assessment patient was with increased work of breathing at a rate 30-35 with peak vent pressures in the 40's.  Fentanyl gtt increased from 92mcg up to 159mcg for patient comfort and work of breathing. RT at bedside; Dr. Vaughan Browner notified of pt. Condition.  Orders received for ABG and possible bronchoscopy.

## 2019-05-22 NOTE — Progress Notes (Signed)
Notified Dr. Aundra Dubin re increased CVP of 17 (verified with 2nd RN).  Orders received to increase Lasix gtt to 12mg /hr; if no results then give 2.5 Metolazone per tube (see care order instructions).

## 2019-05-22 NOTE — Progress Notes (Signed)
Nutrition Follow-up   DOCUMENTATION CODES:   Not applicable  INTERVENTION:   Monitor for BM- day 5 without  Increase tube feeding: - Vital AF 1.2 @ 60 ml/hr via OGT (1440 ml/day)  Tube feeding regimen provides 1728 kcal, 108 grams of protein, and 1168 ml of H2O.   NUTRITION DIAGNOSIS:   Inadequate oral intake related to inability to eat as evidenced by NPO status.  Ongoing  GOAL:   Patient will meet greater than or equal to 90% of their needs   Addressed via TF  MONITOR:   Vent status, Labs, Weight trends, Skin, I & O's  REASON FOR ASSESSMENT:   Ventilator    ASSESSMENT:   68 year old male who presented to the ED on 10/22 with chest pain and SOB. PMH of CAD, HTN, MI in 1998. Pt admitted with acute hypoxic respiratory failure due to acute pulmonary edema, PNA, possible ACS/NSTEMI. Pt required intubation.   10/23- s/p right/left heart cath 10/24- impella changed from femoral to R axillary 10/28- s/p bronchoscopy   Continues on pressors. Continues on Impella. Cr increased but UOP remains good. TF held for bronchoscopy. Plan to restart this afternoon.   Patient is currently intubated on ventilator support MV: 14.1 L/min Temp (24hrs), Avg:98.1 F (36.7 C), Min:97.5 F (36.4 C), Max:98.8 F (37.1 C)    Admission weight: 72.6 kg  Current weight: 74.5 kg    I/O: +5,714 ml since admit UOP: 4,290 ml x 24 hrs   Drips: amiodarone, 250 mg lasix in D5 @ 8 ml/hr,milrinone, levophed Medications: SS novolog, solumedrol Labs: K 3.2 (L) CBG 102-191 Cr 3.55- trending back up  Diet Order:   Diet Order    None      EDUCATION NEEDS:   No education needs have been identified at this time  Skin:  Skin Assessment: Skin Integrity Issues: Skin Integrity Issues:: Incisions Incisions: R chest  Last BM:  PTA  Height:   Ht Readings from Last 1 Encounters:  05/07/2019 5\' 7"  (1.702 m)    Weight:   Wt Readings from Last 1 Encounters:  05/22/19 74.5 kg    Ideal  Body Weight:  67.3 kg  BMI:  Body mass index is 25.72 kg/m.  Estimated Nutritional Needs:   Kcal:  1726 kcal  Protein:  90-110 grams  Fluid:  >/= 1.5 L/day  Mariana Single RD, LDN Clinical Nutrition Pager # - [phone]

## 2019-05-22 NOTE — Procedures (Signed)
Bronchoscopy Procedure Note Amjad Fikes [id number] 1951/04/22  Procedure: Bronchoscopy Indications: Diagnostic evaluation of the airways and Obtain specimens for culture and/or other diagnostic studies  Procedure Details Consent: Risks of procedure as well as the alternatives and risks of each were explained to the (patient/caregiver).  Consent for procedure obtained. Time Out: Verified patient identification, verified procedure, site/side was marked, verified correct patient position, special equipment/implants available, medications/allergies/relevent history reviewed, required imaging and test results available.  Performed  In preparation for procedure, patient was given 100% FiO2 and bronchoscope lubricated. Sedation: Benzodiazepines and Muscle relaxants  Airway entered and the following bronchi were examined: RUL, RML, RLL, LUL, LLL and Bronchi.   Airways are clean with no abnormalities. The ETT was found in right main stem bronchus. It was retracted under bronch guidance and positioned above the carina.   Procedures performed: BAL performed in RLL. 60 cc instilled and 30cc return of clear fluid obtained.  Bronchoscope removed.  , Patient placed back on 100% FiO2 at conclusion of procedure.    Evaluation Hemodynamic Status: BP stable throughout; O2 sats: stable throughout Patient's Current Condition: stable Specimens:  Sent BAL fluid Complications: No apparent complications Patient did tolerate procedure well.   Deletha Jaffee 05/22/2019

## 2019-05-22 NOTE — Progress Notes (Signed)
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  No Known Allergies  Patient Measurements: Height: _0  (170.2 cm)(measured x3) Weight: 164 lb 3.9 oz (74.5 kg) IBW/kg (Calculated) : 66.1  Vital Signs: Temp: 99.7 F (37.6 C) (10/28 1830) BP: 98/75 (10/28 1800)  Labs: Recent Labs    05/20/19 0353  05/20/19 5784  05/21/19 0406  05/21/19 2047  05/22/19 0225 05/22/19 0439 05/22/19 0447 05/22/19 0815 05/22/19 1430 05/22/19 1700  HGB  --   --  7.9*   < > 7.4*   < >  --    < > 9.0* 7.5*  --  8.5*  --   --   HCT  --   --  23.4*   < > 22.4*   < >  --    < > 28.3* 22.0*  --  25.0*  --   --   PLT  --   --  66*  --  36*  --   --   --  30*  --   --   --   --   --   APTT  --   --   --   --   --    < > 69*  --   --   --  73*  --   --  78*  HEPARINUNFRC <0.10*  --   --   --  0.15*  --   --   --   --   --   --   --   --   --   CREATININE  --    < > 3.90*  --  3.87*  --   --   --  3.55*  --   --   --  3.85*  --    < > = values in this interval not displayed.    Estimated Creatinine Clearance: 16.9 mL/min (A) (by C-G formula based on SCr of 3.85 mg/dL (H)).   Medical History: Past Medical History:  Diagnosis Date  . Acute respiratory failure with hypoxemia ([place]) 04/2019  . Carotid artery occlusion   . Hypertension     Medications:  Scheduled:  . arformoterol  15 mcg Nebulization BID  . aspirin  81 mg Per Tube Daily  . atorvastatin  80 mg Per Tube q1800  . budesonide (PULMICORT) nebulizer solution  0.5 mg Nebulization BID  . chlorhexidine gluconate (MEDLINE KIT)  15 mL Mouth Rinse BID  . Chlorhexidine Gluconate Cloth  6 each Topical Daily  . insulin aspart  1-3 Units Subcutaneous Q4H  . ipratropium  0.5 mg Nebulization Q6H  . lidocaine-EPINEPHrine  20 mL Infiltration Once  . mouth rinse  15 mL Mouth Rinse 10 times per day  . methylPREDNISolone (SOLU-MEDROL) injection  40 mg Intravenous Q12H  . pantoprazole  (PROTONIX) IV  40 mg Intravenous QHS  . potassium chloride  20 mEq Oral Once  . sodium chloride flush  10-40 mL Intracatheter Q12H  . sodium chloride flush  3 mL Intravenous Q12H  . sodium chloride flush  3 mL Intravenous Q12H  . vancomycin variable dose per unstable renal function (pharmacist dosing)   Does not apply See admin instructions   Infusions:  . sodium chloride 10 mL/hr at 05/20/19 0910  . sodium chloride Stopped (05/22/19 1301)  . sodium chloride 10 mL/hr at 05/22/19 1800  . sodium chloride    . amiodarone 30 mg/hr (05/22/19 1800)  . bivalirudin (  ANGIOMAX) infusion 0.5 mg/mL (Non-ACS indications) 0.05 mg/kg/hr (05/22/19 1800)  . ceFEPime (MAXIPIME) IV    . dexmedetomidine (PRECEDEX) IV infusion Stopped (05/22/19 0359)  . dextrose 5 % Impella 5.0 Purge solution    . feeding supplement (VITAL AF 1.2 CAL) 1,000 mL (05/22/19 1400)  . fentaNYL infusion INTRAVENOUS 250 mcg/hr (05/22/19 1800)  . furosemide (LASIX) infusion 12 mg/hr (05/22/19 1800)  . midazolam 2 mg/hr (05/22/19 1134)  . milrinone 0.25 mcg/kg/min (05/22/19 1800)  . norepinephrine (LEVOPHED) Adult infusion 10 mcg/min (05/22/19 1800)    Assessment: 61 yoM admitted with shock and concern for ACS now s/p Impella CP placement in cath lab.  Impella CP swapped out for 5.0 d/t hemolysis. LDH up 538. Pltc dropping from 200s baseline down to 30 sec this AM. Heparin stopped on 10/27 with concern for HIT. Purge solution now with D5W only, running at 17.1 ml/hr. Pharmacy consulted to start bivalirudin for systemic anticoagulation while HIT panel is pending.   aPTT continues to be within goal at 78 sec. No bleeding issues noted.   Goal of Therapy:  aPTT 50-85 seconds Monitor platelets by anticoagulation protocol: Yes   Plan:  Continue bivalirudin at 0.05 mg/kg/hr Will check aPTT daily with very stable labs Daily aPTT, CBC Monitor for bleeding, HIT panel results  Erin Hearing PharmD., BCPS Clinical  Pharmacist 05/22/2019 6:54 PM

## 2019-05-22 NOTE — Progress Notes (Signed)
Patient ID: Raymond Chavez, male   DOB: 06-07-1951, 68 y.o.   MRN: [id number]    Advanced Heart Failure Rounding Note   Subjective:    Underwent placement of Impella 5.0 on 10/24 with removal of R femoral Impella CP.   Remains intubated. Had difficult night with increased work of breathing.  CCM working with vent modes.   Norepinephrine off, remains on milrinone 0.25.  1 dose Lasix 60 mg IV yesterday, good UOP with creatinine lower at 3.55 but I/Os net positive.   Afebrile, no antibiotics, no PNA on CXR.   Plts 30K this morning, now on bivalirudin pending HIT.   Follows commands.   Swan #s  PA 62/30  CVP 14-15 CI 2.9 Co-ox ?88%  Impella P-8 No flows due to damage of sensor during placement. Motor current stable.   RHC/LHC (10/23):  Coronary Findings  Diagnostic Dominance: Right Left Main  75% distal left main stenosis.  Left Anterior Descending  Occluded proximally, some late filling by collaterals.  Ramus Intermedius  Large vessel, patent with luminal irregularities.  Left Circumflex  80% mid LCx stenosis. Small OM1 with 95% proximal stenosis. Large OM2 is subtotally occluded (fills late with TIMI 2 flow).  Right Coronary Artery  80% proximal stenosis. Serial 50% mid-vessel stenoses. PDA with only luminal irregularities. Large PLV with 80% mid-vessel stenosis.  Intervention  No interventions have been documented. Right Heart  Right Heart Pressures RHC Procedural Findings (mmHg): Hemodynamics RA mean 13 RV 70/17 PA 73/41, mean 53 PCWP mean 30 LV 140/40 AO 138/88  Oxygen saturations: PA 60% AO 97%  Cardiac Output (Fick) 2.95  Cardiac Index (Fick) 1.62 PVR 7.8 WU  Cardiac Output (Thermo) 3.24 Cardiac Index (Thermo) 1.78  PVR 7.1 WU  PAPI 2.46    Objective:   Weight Range:  Vital Signs:   Temp:  [97.2 F (36.2 C)-98.8 F (37.1 C)] 97.9 F (36.6 C) (10/28 0700) Pulse Rate:  [84-95] 84 (10/28 0211) Resp:  [21-34] 30 (10/28 0700) BP:  (88-146)/(61-105) 123/86 (10/28 0600) SpO2:  [98 %-100 %] 100 % (10/28 0700) Arterial Line BP: (87-159)/(58-91) 116/73 (10/28 0700) FiO2 (%):  [40 %] 40 % (10/28 0322) Weight:  [74.5 kg] 74.5 kg (10/28 0500) Last BM Date: (UTA)  Weight change: Filed Weights   05/20/19 0440 05/21/19 0500 05/22/19 0500  Weight: 74.5 kg 74.6 kg 74.5 kg    Intake/Output:   Intake/Output Summary (Last 24 hours) at 05/22/2019 0744 Last data filed at 05/22/2019 0700 Gross per 24 hour  Intake 5429.02 ml  Output 4290 ml  Net 1139.02 ml     Physical Exam: CVP 14-15 General: Tachypneic on vent.  Neck: JVP 12 cm, no thyromegaly or thyroid nodule.  Lungs: Decreased at bases.  CV: Nondisplaced PMI.  Heart regular S1/S2, no S3/S4, no murmur. Trace ankle edema.   Abdomen: Soft, nontender, no hepatosplenomegaly, no distention.  Skin: Intact without lesions or rashes.  Neurologic: Wakes up and can follow commands.  Extremities: No clubbing or cyanosis. Right axillary Impella, mild swelling right arm.  HEENT: Normal.   Telemetry: NSR 80s-90s with PACs/PVCs   Labs: Basic Metabolic Panel: Recent Labs  Lab 05/25/2019 0350  05/22/2019 1610  05/06/2019 2127  05/19/19 0434 05/20/19 0637  05/21/19 0406 05/21/19 1710 05/22/19 0113 05/22/19 0225 05/22/19 0439  NA 137   < >  --    < > 137   < > 138 137   < > 136 139 140 139 141  K 4.3   < >  --    < >  3.4*   < > 3.6 3.3*   < > 3.3* 3.3* 3.5 3.5 3.2*  CL 98   < >  --    < > 98  --  102 104  --  106  --   --  106  --   CO2 27   < >  --   --  25  --  25 22  --  20*  --   --  22  --   GLUCOSE 153*   < >  --    < > 148*  --  168* 176*  --  133*  --   --  145*  --   BUN 43*   < >  --    < > 51*  --  51* 56*  --  58*  --   --  58*  --   CREATININE 2.79*   < >  --    < > 3.43*  --  3.49* 3.90*  --  3.87*  --   --  3.55*  --   CALCIUM 8.4*   < >  --   --  8.4*  --  7.8* 7.5*  --  7.9*  --   --  8.0*  --   MG 2.5*  --  2.3  --   --   --  2.0 2.0  --  2.1  --   --   --    --   PHOS 4.0  --  3.8  --   --   --  3.7 3.4  --  2.5  --   --   --   --    < > = values in this interval not displayed.    Liver Function Tests: Recent Labs  Lab 05/01/2019 1200 04/26/2019 2127 05/19/19 0434 05/20/19 0637 05/21/19 0406  AST 130* 118* 64* 27 22  ALT _0 ALKPHOS 63 61 53 48 56  BILITOT 2.7* 1.8* 1.1 1.0 1.1  PROT 5.7* 5.3* 4.8* 4.8* 4.7*  ALBUMIN 2.7* 2.5* 2.2* 2.2* 2.2*   No results for input(s): LIPASE, AMYLASE in the last 168 hours. No results for input(s): AMMONIA in the last 168 hours.  CBC: Recent Labs  Lab 04/25/2019 1834  05/25/2019 0900  04/28/2019 1504  05/09/2019 2127  05/19/19 0434 05/20/19 0637  05/21/19 0406 05/21/19 1710 05/22/19 0113 05/22/19 0225 05/22/19 0439  WBC 19.6*   < > 20.8*  --  15.9*   < > 22.9*  --  23.0* 22.8*  --  13.4*  --   --  10.3  --   NEUTROABS 17.1*  --  17.8*  --  13.3*  --  17.4*  --   --   --   --   --   --   --   --   --   HGB 17.2*   < > 16.3   < > 14.7   < > 11.7*   < > 9.5* 7.9*   < > 7.4* 9.5* 9.2* 9.0* 7.5*  HCT 53.3*   < > 51.1   < > 45.8   < > 33.3*   < > 27.8* 23.4*   < > 22.4* 28.0* 27.0* 28.3* 22.0*  MCV 94.3   < > 93.1  --  94.2   < > 87.4  --  88.8 90.7  --  91.1  --   --  89.8  --   PLT 213   < >  204  --  149*   < > 120*  --  89* 66*  --  36*  --   --  30*  --    < > = values in this interval not displayed.    Cardiac Enzymes: No results for input(s): CKTOTAL, CKMB, CKMBINDEX, TROPONINI in the last 168 hours.  BNP: BNP (last 3 results) Recent Labs    05/11/2019 1739 05/25/2019 1234  BNP 2,725.3* 3,370.0*    ProBNP (last 3 results) No results for input(s): PROBNP in the last 8760 hours.    Other results:  Imaging: Dg Chest Port 1 View  Result Date: 05/22/2019 CLINICAL DATA:  Respiratory distress EXAM: PORTABLE CHEST 1 VIEW COMPARISON:  05/21/2019 FINDINGS: Cardiac shadow is stable. Endotracheal tube, gastric catheter and Swan-Ganz catheter are noted in satisfactory position.  Right-sided PICC line and Impella catheter are seen and stable. The lungs are well aerated bilaterally. No focal infiltrate or sizable effusion is seen. No bony abnormality is seen. IMPRESSION: Tubes and lines as described above stable from the previous exam. No acute abnormality noted. Electronically Signed   By: Inez Catalina M.D.   On: 05/22/2019 01:33   Dg Chest Port 1 View  Result Date: 05/21/2019 CLINICAL DATA:  Respiratory failure. EXAM: PORTABLE CHEST 1 VIEW COMPARISON:  May 20, 2019. FINDINGS: Endotracheal nasogastric tubes are unchanged in position. Left ventricular assist device is unchanged in position. Right internal jugular Swan-Ganz catheter is noted with tip directed into right pulmonary artery. No pneumothorax or pleural effusion is noted. No acute pulmonary disease is noted. Bony thorax is unremarkable. IMPRESSION: Stable support apparatus. No acute cardiopulmonary abnormality seen. Electronically Signed   By: Marijo Conception M.D.   On: 05/21/2019 07:25   Dg Chest Port 1 View  Result Date: 05/20/2019 CLINICAL DATA:  Endotracheal tube placement EXAM: PORTABLE CHEST 1 VIEW COMPARISON:  05/20/2019 FINDINGS: Endotracheal tube 6.2 cm above the carina. NG tube extends below the hemidiaphragms into the stomach with the tip not visualized. Swan-Ganz catheter directed into the right pulmonary artery. Impella ventricular assist device unchanged. Stable heart size. Improvement in the central vascular congestion. No focal airspace process, collapse or consolidation. Negative for edema, large effusion or pneumothorax. IMPRESSION: Stable support apparatus. Improved vascular congestion/mild edema pattern. Electronically Signed   By: Jerilynn Mages.  Shick M.D.   On: 05/20/2019 20:56   Vas [place] Upper Extremity Venous Duplex  Result Date: 05/22/2019 UPPER VENOUS STUDY  Indications: Edema Other Indications: Impella, PICC line, IV, all in right chest/arm. Limitations: Line and bandages. Comparison Study: No prior  study on file for comparison. Performing Technologist: Sharion Dove RVS  Examination Guidelines: A complete evaluation includes B-mode imaging, spectral Doppler, color Doppler, and power Doppler as needed of all accessible portions of each vessel. Bilateral testing is considered an integral part of a complete examination. Limited examinations for reoccurring indications may be performed as noted.  Right Findings: +----------+------------+---------+-----------+----------+---------------------+  RIGHT      Compressible Phasicity Spontaneous Properties        Summary         +----------+------------+---------+-----------+----------+---------------------+  IJV                                                         Not visualized      +----------+------------+---------+-----------+----------+---------------------+  Subclavian  Yes        Yes                 patent by color and                                                                    Doppler         +----------+------------+---------+-----------+----------+---------------------+  Axillary                                                    Not visualized      +----------+------------+---------+-----------+----------+---------------------+  Brachial       Full        Yes        Yes                                       +----------+------------+---------+-----------+----------+---------------------+  Radial         Full                                                             +----------+------------+---------+-----------+----------+---------------------+  Ulnar                                                       patent by color     +----------+------------+---------+-----------+----------+---------------------+  Cephalic       Full                                                             +----------+------------+---------+-----------+----------+---------------------+  Basilic        Full                                                              +----------+------------+---------+-----------+----------+---------------------+  Left Findings: +----------+------------+---------+-----------+----------+-------+  LEFT       Compressible Phasicity Spontaneous Properties Summary  +----------+------------+---------+-----------+----------+-------+  Subclavian                 Yes        Yes                         +----------+------------+---------+-----------+----------+-------+  Summary:  Right: No evidence of deep vein thrombosis in the upper extremity. However, unable to visualize the IJV,  axillary. No evidence of superficial vein thrombosis in the upper extremity. However, unable to visualize the IJV, axillary. No evidence of thrombosis in the . However, unable to visualize the IJV, axillary.  Left: No evidence of thrombosis in the subclavian.  *See table(s) above for measurements and observations.  Diagnosing physician: Deitra Mayo MD Electronically signed by Deitra Mayo MD on 05/22/2019 at 6:24:27 AM.    Final      Medications:     Scheduled Medications:  aspirin  81 mg Per Tube Daily   atorvastatin  80 mg Per Tube q1800   chlorhexidine gluconate (MEDLINE KIT)  15 mL Mouth Rinse BID   Chlorhexidine Gluconate Cloth  6 each Topical Daily   furosemide       furosemide  80 mg Intravenous Once   insulin aspart  1-3 Units Subcutaneous Q4H   lidocaine-EPINEPHrine  20 mL Infiltration Once   mouth rinse  15 mL Mouth Rinse 10 times per day   pantoprazole (PROTONIX) IV  40 mg Intravenous QHS   potassium chloride  20 mEq Oral Once   sodium chloride flush  10-40 mL Intracatheter Q12H   sodium chloride flush  3 mL Intravenous Q12H   sodium chloride flush  3 mL Intravenous Q12H    Infusions:  sodium chloride 10 mL/hr at 05/20/19 0910   sodium chloride 10 mL/hr at 05/22/19 0700   sodium chloride 10 mL/hr at 05/22/19 0700   sodium chloride     amiodarone 30 mg/hr (05/22/19 0700)   bivalirudin (ANGIOMAX)  infusion 0.5 mg/mL (Non-ACS indications) 0.05 mg/kg/hr (05/22/19 0700)   dexmedetomidine (PRECEDEX) IV infusion Stopped (05/22/19 0359)   dextrose 5 % Impella 5.0 Purge solution     feeding supplement (VITAL AF 1.2 CAL) 1,000 mL (05/20/19 2000)   fentaNYL infusion INTRAVENOUS 25 mcg/hr (05/22/19 0700)   furosemide (LASIX) infusion     midazolam Stopped (05/20/19 0502)   milrinone 0.25 mcg/kg/min (05/22/19 0700)   norepinephrine (LEVOPHED) Adult infusion Stopped (05/22/19 0518)   potassium chloride      PRN Medications: sodium chloride, Place/Maintain arterial line **AND** sodium chloride, sodium chloride, docusate, fentaNYL, fentaNYL (SUBLIMAZE) injection, fentaNYL (SUBLIMAZE) injection, influenza vaccine adjuvanted, ipratropium-albuterol, midazolam, midazolam, pneumococcal 23 valent vaccine, sodium chloride flush, sodium chloride flush   Assessment/Plan:   1. Shock: Now primarily cardiogenic (initially concern for septic component).  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  Echo with EF 20%, RV moderately HK.  Impella CP placed 10/23. Switched for Impella 5.0 on 10/24. No flow meter on Impella due to damage to sensor on insertion. Patient has LBBB.  On milrinone 0.25 and off norepinephrine this morning.  Good CI at 2.9. CVP 14-15, tachypneic.  I<O despite good UOP. Creatinine trended down.  - Continue current milrinone 0.25.  - Lasix 80 mg IV x 1 and gtt at 8 mg/hr.  Replace K.  - Continue Impella at P8, appears stable this morning. LDH fairly stable at 538. Will assess position under echo today.  2. CAD:  NSTEMI, hs-TnI 16,000. 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  - continue ASA/statin. No b-blocker with shock - CABG with Impella 5.0 support likely best option when able 3. AKI: Baseline creatinine 1.3., now AKI likely due to ATN from shock and contrast (had CTA for PE at admission, only 25 cc contrast with cath).  Creatinine lower today at 3.55. Good UOP.  Continue to support  cardiac output.  4. Acute Respiratory Failure: Suspect primarily pulmonary edema at this point.  Initial concern for PNA but CXR clear and off abx. Tachypneic this morning.  -  Diurese today.  -  Appreciate CCM help with vent.   5. ID: Suspected PNA.  PCT 6.85-> 3.64.  WBCs now normal.  He was on vancomycin/cefepime, now stopped.  Afebrile. Cultures NGTD.  6. LBBB: Unsure chronicity.  7. Anemia: Hgb 9 this morning, 8.2 on co-ox.  LDH stable, suspect hemolysis is not active at this point (urine clear).   8. Thrombocytopenia: Platelets at 30 K today.  ?HIT, ?hemolysis, ?sepsis.  - Pending HIT, he is on bivalirudin with no clinically apparent bleeding.   9. PVCs/NSVT: Amiodarone gtt.  10. FEN: Tube feeds.   CRITICAL CARE Performed by: Loralie Champagne  Total critical care time: 40 minutes  Critical care time was exclusive of separately billable procedures and treating other patients.  Critical care was necessary to treat or prevent imminent or life-threatening deterioration.  Critical care was time spent personally by me on the following activities: development of treatment plan with patient and/or surrogate as well as nursing, discussions with consultants, evaluation of patient's response to treatment, examination of patient, obtaining history from patient or surrogate, ordering and performing treatments and interventions, ordering and review of laboratory studies, ordering and review of radiographic studies, pulse oximetry and re-evaluation of patient's condition.   Length of Stay: [place] NP-C  05/22/2019, 7:44 AM  Advanced Heart Failure Team Pager [phone] (M-F; 7a - 4p)  Please contact [facility] for night-coverage after hours (4p -7a ) and weekends on [email or web address]

## 2019-05-22 NOTE — Progress Notes (Signed)
Pt with diminished breath sounds, very tight and very little air movement. Pt looks like he is struggling to breathe. When pt is asked if it is hard for him to breathe, he nods yes and nods when asked if he is in pain. MD [place] [facility]) called and placed order for STAT CXR, and recommended RRT give Duoneb. ABG was also drawn, and the results were communicated to the MD. Awaiting any new orders. Will collect morning labs early. Will continue to monitor pt closely this shift.

## 2019-05-22 NOTE — Progress Notes (Signed)
[facility] ROUNDING NOTE   Subjective:   68 year old gentleman with a history of cardiogenic shock status post cardiac catheterization 05/02/2019 with severe three-vessel coronary disease.  Echocardiogram reveals ejection fraction of 20%.  Impella device placed 05/21/2019 with switch to 5.0 Impella 05/21/2019.  Continues on milrinone 0.25 mcg and norepinephrine 3 mcg.  Complicated by pneumonia treated with vancomycin/cefepime.  Although it appears that these antibiotics have now been discontinued.  Considering patient for CABG.  Blood pressure 112/72 pulse 84 temperature 98.1 O2 sats 100% FiO2 40%  Sodium 141 potassium 3.4 chloride 106 CO2 22 BUN 58 creatinine 3.55 glucose 145 creatinine 3.55 calcium 8.0 WBC 10.3 hemoglobin 8.5 platelets 30  Aspirin 81 mg daily Lipitor 80 mg daily, Protonix 40 mg daily   Lasix 80 mg IV administered 05/21/2019 with brisk urine output.  Now placed on Lasix drip  Urine output 4.39 L  IV amiodarone IV Angiomax IV milrinone IV norepinephrine   Objective:  Vital signs in last 24 hours:  Temp:  [97.2 F (36.2 C)-98.8 F (37.1 C)] 98.1 F (36.7 C) (10/28 0845) Pulse Rate:  [84-95] 84 (10/28 0211) Resp:  [21-34] 30 (10/28 0845) BP: (88-146)/(61-105) 117/80 (10/28 0800) SpO2:  [98 %-100 %] 100 % (10/28 0845) Arterial Line BP: (87-159)/(58-91) 99/66 (10/28 0845) FiO2 (%):  [40 %] 40 % (10/28 0322) Weight:  [74.5 kg] 74.5 kg (10/28 0500)  Weight change: -0.1 kg Filed Weights   05/20/19 0440 05/21/19 0500 05/22/19 0500  Weight: 74.5 kg 74.6 kg 74.5 kg    Intake/Output: I/O last 3 completed shifts: In: 6676 [P.O.:1400; I.V.:2202.8; Blood:345; Other:653.2; NG/GT:2075] Out: 5000 [Urine:5000]   Intake/Output this shift:  Total I/O In: 76.4 [I.V.:59.3; Other:17.1] Out: 200 [Urine:200]      General: Sedated NAD HEENT: MMM [place] AT anicteric sclera, orally intubated Neck:  No JVD, no adenopathy CV:  Heart RRR  Lungs:  L/S CTA  bilaterally Abd:  abd SNT/ND with normal BS GU:  Bladder non-palpable, positive Foley with clear urine Extremities: +2 bilateral lower extremity edema Skin:  No skin rash   Basic Metabolic Panel: Recent Labs  Lab 04/25/2019 0350  05/25/2019 1610  05/13/2019 2127  05/19/19 0434 05/20/19 0637  05/21/19 0406 05/21/19 1710 05/22/19 0113 05/22/19 0225 05/22/19 0439 05/22/19 0815  NA 137   < >  --    < > 137   < > 138 137   < > 136 139 140 139 141 141  K 4.3   < >  --    < > 3.4*   < > 3.6 3.3*   < > 3.3* 3.3* 3.5 3.5 3.2* 3.4*  CL 98   < >  --    < > 98  --  102 104  --  106  --   --  106  --   --   CO2 27   < >  --   --  25  --  25 22  --  20*  --   --  22  --   --   GLUCOSE 153*   < >  --    < > 148*  --  168* 176*  --  133*  --   --  145*  --   --   BUN 43*   < >  --    < > 51*  --  51* 56*  --  58*  --   --  58*  --   --  CREATININE 2.79*   < >  --    < > 3.43*  --  3.49* 3.90*  --  3.87*  --   --  3.55*  --   --   CALCIUM 8.4*   < >  --   --  8.4*  --  7.8* 7.5*  --  7.9*  --   --  8.0*  --   --   MG 2.5*  --  2.3  --   --   --  2.0 2.0  --  2.1  --   --   --   --   --   PHOS 4.0  --  3.8  --   --   --  3.7 3.4  --  2.5  --   --   --   --   --    < > = values in this interval not displayed.    Liver Function Tests: Recent Labs  Lab 05/01/2019 1200 05/14/2019 2127 05/19/19 0434 05/20/19 0637 05/21/19 0406  AST 130* 118* 64* 27 22  ALT _0 ALKPHOS 63 61 53 48 56  BILITOT 2.7* 1.8* 1.1 1.0 1.1  PROT 5.7* 5.3* 4.8* 4.8* 4.7*  ALBUMIN 2.7* 2.5* 2.2* 2.2* 2.2*   No results for input(s): LIPASE, AMYLASE in the last 168 hours. No results for input(s): AMMONIA in the last 168 hours.  CBC: Recent Labs  Lab 05/01/2019 1834  05/14/2019 0900  05/04/2019 1504  04/27/2019 2127  05/19/19 0434 05/20/19 0637  05/21/19 0406 05/21/19 1710 05/22/19 0113 05/22/19 0225 05/22/19 0439 05/22/19 0815  WBC 19.6*   < > 20.8*  --  15.9*   < > 22.9*  --  23.0* 22.8*  --  13.4*  --   --   10.3  --   --   NEUTROABS 17.1*  --  17.8*  --  13.3*  --  17.4*  --   --   --   --   --   --   --   --   --   --   HGB 17.2*   < > 16.3   < > 14.7   < > 11.7*   < > 9.5* 7.9*   < > 7.4* 9.5* 9.2* 9.0* 7.5* 8.5*  HCT 53.3*   < > 51.1   < > 45.8   < > 33.3*   < > 27.8* 23.4*   < > 22.4* 28.0* 27.0* 28.3* 22.0* 25.0*  MCV 94.3   < > 93.1  --  94.2   < > 87.4  --  88.8 90.7  --  91.1  --   --  89.8  --   --   PLT 213   < > 204  --  149*   < > 120*  --  89* 66*  --  36*  --   --  30*  --   --    < > = values in this interval not displayed.    Cardiac Enzymes: No results for input(s): CKTOTAL, CKMB, CKMBINDEX, TROPONINI in the last 168 hours.  BNP: Invalid input(s): POCBNP  CBG: Recent Labs  Lab 05/21/19 1635 05/21/19 2028 05/22/19 0101 05/22/19 0437 05/22/19 0817  GLUCAP 132* 102* 77 132* 100*    Microbiology: Results for orders placed or performed during the hospital encounter of 05/03/2019  Urine culture     Status: None   Collection Time: 05/15/2019 12:16 AM   Specimen:  Urine, Random  Result Value Ref Range Status   Specimen Description URINE, RANDOM  Final   Special Requests NONE  Final   Culture   Final    NO GROWTH Performed at [facility], [address] [place], [place] [address]    Report Status 05/21/2019 FINAL  Final  Respiratory Panel by PCR     Status: None   Collection Time: 04/29/2019  1:58 AM   Specimen: Nasopharyngeal Swab; Respiratory  Result Value Ref Range Status   Adenovirus NOT DETECTED NOT DETECTED Final   Coronavirus 229E NOT DETECTED NOT DETECTED Final    Comment: (NOTE) The Coronavirus on the Respiratory Panel, DOES NOT test for the novel  Coronavirus (2019 nCoV)    Coronavirus HKU1 NOT DETECTED NOT DETECTED Final   Coronavirus NL63 NOT DETECTED NOT DETECTED Final   Coronavirus OC43 NOT DETECTED NOT DETECTED Final   Metapneumovirus NOT DETECTED NOT DETECTED Final   Rhinovirus / Enterovirus NOT DETECTED NOT DETECTED Final   Influenza A NOT  DETECTED NOT DETECTED Final   Influenza B NOT DETECTED NOT DETECTED Final   Parainfluenza Virus 1 NOT DETECTED NOT DETECTED Final   Parainfluenza Virus 2 NOT DETECTED NOT DETECTED Final   Parainfluenza Virus 3 NOT DETECTED NOT DETECTED Final   Parainfluenza Virus 4 NOT DETECTED NOT DETECTED Final   Respiratory Syncytial Virus NOT DETECTED NOT DETECTED Final   Bordetella pertussis NOT DETECTED NOT DETECTED Final   Chlamydophila pneumoniae NOT DETECTED NOT DETECTED Final   Mycoplasma pneumoniae NOT DETECTED NOT DETECTED Final    Comment: Performed at [facility], [place] [address], [place], [place] [address]  SARS Coronavirus 2 by RT PCR (hospital order, performed in [facility] hospital lab) Nasopharyngeal Nasopharyngeal Swab     Status: None   Collection Time: 04/26/2019  3:55 PM   Specimen: Nasopharyngeal Swab  Result Value Ref Range Status   SARS Coronavirus 2 NEGATIVE NEGATIVE Final    Comment: (NOTE) If result is NEGATIVE SARS-CoV-2 target nucleic acids are NOT DETECTED. The SARS-CoV-2 RNA is generally detectable in upper and lower  respiratory specimens during the acute phase of infection. The lowest  concentration of SARS-CoV-2 viral copies this assay can detect is 250  copies / mL. A negative result does not preclude SARS-CoV-2 infection  and should not be used as the sole basis for treatment or other  patient management decisions.  A negative result may occur with  improper specimen collection / handling, submission of specimen other  than nasopharyngeal swab, presence of viral mutation(s) within the  areas targeted by this assay, and inadequate number of viral copies  (<250 copies / mL). A negative result must be combined with clinical  observations, patient history, and epidemiological information. If result is POSITIVE SARS-CoV-2 target nucleic acids are DETECTED. The SARS-CoV-2 RNA is generally detectable in upper and lower  respiratory specimens dur ing the acute  phase of infection.  Positive  results are indicative of active infection with SARS-CoV-2.  Clinical  correlation with patient history and other diagnostic information is  necessary to determine patient infection status.  Positive results do  not rule out bacterial infection or co-infection with other viruses. If result is PRESUMPTIVE POSTIVE SARS-CoV-2 nucleic acids MAY BE PRESENT.   A presumptive positive result was obtained on the submitted specimen  and confirmed on repeat testing.  While 2019 novel coronavirus  (SARS-CoV-2) nucleic acids may be present in the submitted sample  additional confirmatory testing may be  necessary for epidemiological  and / or clinical management purposes  to differentiate between  SARS-CoV-2 and other Sarbecovirus currently known to infect humans.  If clinically indicated additional testing with an alternate test  methodology [phone]) is advised. The SARS-CoV-2 RNA is generally  detectable in upper and lower respiratory sp ecimens during the acute  phase of infection. The expected result is Negative. Fact Sheet for Patients:  StrictlyIdeas.no Fact Sheet for Healthcare Providers: BankingDealers.co.za This test is not yet approved or cleared by the [place] FDA and has been authorized for detection and/or diagnosis of SARS-CoV-2 by FDA under an Emergency Use Authorization (EUA).  This EUA will remain in effect (meaning this test can be used) for the duration of the COVID-19 declaration under Section 564(b)(1) of the Act, 21 U.S.C. section 360bbb-3(b)(1), unless the authorization is terminated or revoked sooner. Performed at [facility], [place] [address] [place], [place] [address]   Blood culture (routine x 2)     Status: None   Collection Time: 04/29/2019  9:00 PM   Specimen: BLOOD  Result Value Ref Range Status   Specimen Description BLOOD LEFT ANTECUBITAL  Final   Special Requests   Final     BOTTLES DRAWN AEROBIC AND ANAEROBIC Blood Culture adequate volume   Culture   Final    NO GROWTH 5 DAYS Performed at [facility], [place] [address], [place], [place] [address]    Report Status 05/21/2019 FINAL  Final  Blood culture (routine x 2)     Status: None   Collection Time: 05/03/2019  9:00 PM   Specimen: BLOOD RIGHT HAND  Result Value Ref Range Status   Specimen Description BLOOD RIGHT HAND  Final   Special Requests   Final    AEROBIC BOTTLE ONLY Blood Culture results may not be optimal due to an inadequate volume of blood received in culture bottles   Culture   Final    NO GROWTH 5 DAYS Performed at [facility], [place] [address] [place], [place] [address]    Report Status 05/21/2019 FINAL  Final  MRSA PCR Screening     Status: None   Collection Time: 05/01/2019 11:20 PM   Specimen: Nasal Mucosa; Nasopharyngeal  Result Value Ref Range Status   MRSA by PCR NEGATIVE NEGATIVE Final    Comment:        The GeneXpert MRSA Assay (FDA approved for NASAL specimens only), is one component of a comprehensive MRSA colonization surveillance program. It is not intended to diagnose MRSA infection nor to guide or monitor treatment for MRSA infections. Performed at [facility], [place] [address] [place], [place] [address]   Culture, respiratory (tracheal aspirate)     Status: None   Collection Time: 05/02/2019  3:50 AM   Specimen: Tracheal Aspirate; Respiratory  Result Value Ref Range Status   Specimen Description TRACHEAL ASPIRATE  Final   Special Requests NONE  Final   Gram Stain   Final    RARE WBC PRESENT, PREDOMINANTLY PMN RARE GRAM POSITIVE COCCI IN PAIRS    Culture   Final    RARE Consistent with normal respiratory flora. Performed at [facility], [place] [address] [place], [place] [address]    Report Status 05/20/2019 FINAL  Final    Coagulation Studies: No results for input(s): LABPROT, INR in the last 72 hours.  Urinalysis: No results for  input(s): COLORURINE, LABSPEC, PHURINE, GLUCOSEU, HGBUR, BILIRUBINUR, KETONESUR, PROTEINUR, UROBILINOGEN, NITRITE, LEUKOCYTESUR in  the last 72 hours.  Invalid input(s): APPERANCEUR    Imaging: Dg Chest Port 1 View  Result Date: 05/22/2019 CLINICAL DATA:  Respiratory distress EXAM: PORTABLE CHEST 1 VIEW COMPARISON:  05/21/2019 FINDINGS: Cardiac shadow is stable. Endotracheal tube, gastric catheter and Swan-Ganz catheter are noted in satisfactory position. Right-sided PICC line and Impella catheter are seen and stable. The lungs are well aerated bilaterally. No focal infiltrate or sizable effusion is seen. No bony abnormality is seen. IMPRESSION: Tubes and lines as described above stable from the previous exam. No acute abnormality noted. Electronically Signed   By: Inez Catalina M.D.   On: 05/22/2019 01:33   Dg Chest Port 1 View  Result Date: 05/21/2019 CLINICAL DATA:  Respiratory failure. EXAM: PORTABLE CHEST 1 VIEW COMPARISON:  May 20, 2019. FINDINGS: Endotracheal nasogastric tubes are unchanged in position. Left ventricular assist device is unchanged in position. Right internal jugular Swan-Ganz catheter is noted with tip directed into right pulmonary artery. No pneumothorax or pleural effusion is noted. No acute pulmonary disease is noted. Bony thorax is unremarkable. IMPRESSION: Stable support apparatus. No acute cardiopulmonary abnormality seen. Electronically Signed   By: Marijo Conception M.D.   On: 05/21/2019 07:25   Dg Chest Port 1 View  Result Date: 05/20/2019 CLINICAL DATA:  Endotracheal tube placement EXAM: PORTABLE CHEST 1 VIEW COMPARISON:  05/20/2019 FINDINGS: Endotracheal tube 6.2 cm above the carina. NG tube extends below the hemidiaphragms into the stomach with the tip not visualized. Swan-Ganz catheter directed into the right pulmonary artery. Impella ventricular assist device unchanged. Stable heart size. Improvement in the central vascular congestion. No focal airspace  process, collapse or consolidation. Negative for edema, large effusion or pneumothorax. IMPRESSION: Stable support apparatus. Improved vascular congestion/mild edema pattern. Electronically Signed   By: Jerilynn Mages.  Shick M.D.   On: 05/20/2019 20:56   Vas [place] Upper Extremity Venous Duplex  Result Date: 05/22/2019 UPPER VENOUS STUDY  Indications: Edema Other Indications: Impella, PICC line, IV, all in right chest/arm. Limitations: Line and bandages. Comparison Study: No prior study on file for comparison. Performing Technologist: Sharion Dove RVS  Examination Guidelines: A complete evaluation includes B-mode imaging, spectral Doppler, color Doppler, and power Doppler as needed of all accessible portions of each vessel. Bilateral testing is considered an integral part of a complete examination. Limited examinations for reoccurring indications may be performed as noted.  Right Findings: +----------+------------+---------+-----------+----------+---------------------+ RIGHT     CompressiblePhasicitySpontaneousProperties       Summary        +----------+------------+---------+-----------+----------+---------------------+ IJV                                                    Not visualized     +----------+------------+---------+-----------+----------+---------------------+ Subclavian               Yes       Yes               patent by color and                                                             Doppler        +----------+------------+---------+-----------+----------+---------------------+  Axillary                                               Not visualized     +----------+------------+---------+-----------+----------+---------------------+ Brachial      Full       Yes       Yes                                    +----------+------------+---------+-----------+----------+---------------------+ Radial        Full                                                         +----------+------------+---------+-----------+----------+---------------------+ Ulnar                                                  patent by color    +----------+------------+---------+-----------+----------+---------------------+ Cephalic      Full                                                        +----------+------------+---------+-----------+----------+---------------------+ Basilic       Full                                                        +----------+------------+---------+-----------+----------+---------------------+  Left Findings: +----------+------------+---------+-----------+----------+-------+ LEFT      CompressiblePhasicitySpontaneousPropertiesSummary +----------+------------+---------+-----------+----------+-------+ Subclavian               Yes       Yes                      +----------+------------+---------+-----------+----------+-------+  Summary:  Right: No evidence of deep vein thrombosis in the upper extremity. However, unable to visualize the IJV, axillary. No evidence of superficial vein thrombosis in the upper extremity. However, unable to visualize the IJV, axillary. No evidence of thrombosis in the . However, unable to visualize the IJV, axillary.  Left: No evidence of thrombosis in the subclavian.  *See table(s) above for measurements and observations.  Diagnosing physician: Deitra Mayo MD Electronically signed by Deitra Mayo MD on 05/22/2019 at 6:24:27 AM.    Final      Medications:   . sodium chloride 10 mL/hr at 05/20/19 0910  . sodium chloride 10 mL/hr at 05/22/19 0800  . sodium chloride 10 mL/hr at 05/22/19 0800  . sodium chloride    . amiodarone 30 mg/hr (05/22/19 0800)  . bivalirudin (ANGIOMAX) infusion 0.5 mg/mL (Non-ACS indications) 0.05 mg/kg/hr (05/22/19 0800)  . dexmedetomidine (PRECEDEX) IV infusion Stopped (05/22/19 0359)  . dextrose 5 % Impella 5.0 Purge solution    . feeding supplement (VITAL AF  1.2 CAL) 1,000 mL (05/20/19  2000)  . fentaNYL infusion INTRAVENOUS 100 mcg/hr (05/22/19 0800)  . furosemide (LASIX) infusion 8 mg/hr (05/22/19 0845)  . midazolam Stopped (05/20/19 0502)  . milrinone 0.25 mcg/kg/min (05/22/19 0800)  . norepinephrine (LEVOPHED) Adult infusion Stopped (05/22/19 0518)  . potassium chloride 10 mEq (05/22/19 0831)   . arformoterol  15 mcg Nebulization BID  . aspirin  81 mg Per Tube Daily  . atorvastatin  80 mg Per Tube q1800  . budesonide (PULMICORT) nebulizer solution  0.5 mg Nebulization BID  . chlorhexidine gluconate (MEDLINE KIT)  15 mL Mouth Rinse BID  . Chlorhexidine Gluconate Cloth  6 each Topical Daily  . furosemide      . insulin aspart  1-3 Units Subcutaneous Q4H  . ipratropium  0.5 mg Nebulization Q6H  . lidocaine-EPINEPHrine  20 mL Infiltration Once  . mouth rinse  15 mL Mouth Rinse 10 times per day  . pantoprazole (PROTONIX) IV  40 mg Intravenous QHS  . potassium chloride  20 mEq Oral Once  . sodium chloride flush  10-40 mL Intracatheter Q12H  . sodium chloride flush  3 mL Intravenous Q12H  . sodium chloride flush  3 mL Intravenous Q12H   sodium chloride, Place/Maintain arterial line **AND** sodium chloride, sodium chloride, docusate, fentaNYL, fentaNYL (SUBLIMAZE) injection, fentaNYL (SUBLIMAZE) injection, influenza vaccine adjuvanted, ipratropium-albuterol, midazolam, midazolam, pneumococcal 23 valent vaccine, sodium chloride flush, sodium chloride flush  Assessment/ Plan:  1.Baseline serum creatinine 1.34 on admission.  2. Acute kidney injury. Likely secondary to decreased cardiac output in the setting of congestive heart failure. He may have had some component of contrast-induced nephropathy.  Urine output is picking up today.Continue current management/  .  .  Urinalysis clear. No urgent indication for dialysis.  Continue to avoid nephrotoxins no ACE inhibitor's ARB use nonsteroidal inflammatory drugs IV contrast.  Creatinine appears  to have plateaued.  We will continue to follow  3.  Hypertension/volume patient now on Lasix drip.  3. Coronary artery disease. Triple-vessel disease noted on cardiac catheterization. For possible CABG. 75% distal left main stenosis  4.Acute hypoxemic respiratory failure. Secondary to congestive heart failure.   Looks relatively euvolemic.   5. Acute systolic congestive heart failure. Ejection fraction less than 20%. Continue Impella device support.   Being considered for possible CABG  6.  Hypotension/volume continues on inotropic support.  Milrinone and Levophed  7.  Pneumonia.  Vancomycin and cefepime have been discontinued.  8.  Hypokalemia repleted by primary service   LOS: [place] _0 _1 :55 AM

## 2019-05-22 NOTE — Progress Notes (Signed)
Patient ID: Raymond Chavez, male   DOB: April 05, 1951, 68 y.o.   MRN: [id number]  Impella visualized under echo.  Device advanced from 2.8 cm back to 3.2 cm.   Discussed purge solution with pharmacy and Impella rep, will leave bivalirudin systemic and not in purge for now.   Loralie Champagne 05/22/2019 9:09 AM

## 2019-05-22 NOTE — Progress Notes (Signed)
Pharmacy Antibiotic Note  Raymond Chavez is a 68 y.o. male admitted on 05/25/2019 with chest pain and shortness of breath.  Pharmacy has been consulted for cefepime and vancomycin dosing.  Bronch lavage showing no organisms on culture but body fluid showing bacteria. WBC 10.3, afebrile (on steroids). Scr 3.85 (CrCl 16 mL/min).   Plan: Cefepime 2g IV q24 hours Vancomycin 1g now, consider repeating random level in 48 hours Monitor Scr, clinical pic, cx results, and vancomycin level as needed  Height: _0  (170.2 cm)(measured x3) Weight: 164 lb 3.9 oz (74.5 kg) IBW/kg (Calculated) : 66.1  Temp (24hrs), Avg:98.3 F (36.8 C), Min:97.7 F (36.5 C), Max:99.3 F (37.4 C)  Recent Labs  Lab 05/21/2019 1503  05/11/2019 1752  05/07/2019 0448 05/05/2019 1200  05/02/2019 2127 05/25/2019 2327 05/19/19 0434 05/20/19 0637 05/21/19 0406 05/22/19 0225 05/22/19 1430  WBC  --    < >  --    < >  --   --   --  22.9*  --  23.0* 22.8* 13.4* 10.3  --   CREATININE  --    < >  --    < >  --  3.05*   < > 3.43*  --  3.49* 3.90* 3.87* 3.55* 3.85*  LATICACIDVEN 2.3*  --  2.1*  --  2.1* 1.4  --   --  1.6  --   --   --   --   --   VANCORANDOM  --   --   --   --   --   --   --   --   --  17  --   --   --   --    < > = values in this interval not displayed.    Estimated Creatinine Clearance: 16.9 mL/min (A) (by C-G formula based on SCr of 3.85 mg/dL (H)).    No Known Allergies  Antimicrobials this admission: Cefepime 10/22 >>  Vancomycin 10/22 >>   Microbiology results: 10/28 BAL: no orgs  10/22 BCx: ngtd 10/22 UCx: ng 10/22 Sputum: Sent 10/22 Respiratory PCR: negative 10/22 COVID: negative  Thank you for allowing pharmacy to be a part of this patient's care.  Antonietta Jewel, PharmD, BCCCP Clinical Pharmacist  Phone: [phone]  Please check AMION for all [place] phone numbers After 10:00 PM, call [place] [phone] 05/22/2019 3:41 PM

## 2019-05-22 NOTE — Progress Notes (Signed)
Vent changes were changed back to the original order (9450) due to patient not getting adequate minute ventilation. Volumes were thrending in the mid to upper 200's. Patient is now placed back on PRVC. RN made aware. Patient breathing pattern is irregular and he is struggling to breathe suggestive of impairment of CO2 clearance  MD [place] ([place]) is aware of patient current condition.  BBS to auscultation is still TIGHT and unchanged. Patient possibly needs better sedation or consider paralyzation but Concerning patient kidney function specifically due to his elevated creatinine levels in the blood is suggestive of Kidney insuffiencey and maybe needs CRRT.  This RRT Goal for the patient is to establish and maintain Good Ventilation and Oxygenation via utilization of mechanical ventilation using appropriate settings to meet systemic and myocardial oxygen demands to assure adequate tissue perfusion/oxygenation w/ the prevention of further ventilatory failure. RRT will continue to monitor patient clinical presentation and hemodynamics. No further interventions or orders given.   Emmanual Gauthreaux [name], RRT, RCP

## 2019-05-22 NOTE — Progress Notes (Signed)
eLink Physician-Brief Progress Note Patient Name: Raymond Chavez DOB: [birth year] MRN: [id number]   Date of Service  05/22/2019  HPI/Events of Note  72 M NSTEMI pulmonary edema. 3VD on LHC, EF <20% s/p Impella now with increased work of breathing. CXR ordered, ABG done.  eICU Interventions   CXR without acute abnormality  ABG 7.2/54/166 on PRVC 18/450/40%/5 PEEP  Peak pressure 30s to 40s nebulization ordered  Bedside team to assess     Intervention Category Intermediate Interventions: Respiratory distress - evaluation and management  Judd Lien 05/22/2019, 12:55 AM

## 2019-05-23 ENCOUNTER — Inpatient Hospital Stay (HOSPITAL_COMMUNITY): Payer: PPO

## 2019-05-23 DIAGNOSIS — I509 Heart failure, unspecified: Secondary | ICD-10-CM

## 2019-05-23 DIAGNOSIS — N179 Acute kidney failure, unspecified: Secondary | ICD-10-CM | POA: Diagnosis not present

## 2019-05-23 DIAGNOSIS — R570 Cardiogenic shock: Secondary | ICD-10-CM | POA: Diagnosis not present

## 2019-05-23 DIAGNOSIS — J9600 Acute respiratory failure, unspecified whether with hypoxia or hypercapnia: Secondary | ICD-10-CM | POA: Diagnosis not present

## 2019-05-23 DIAGNOSIS — J810 Acute pulmonary edema: Secondary | ICD-10-CM

## 2019-05-23 DIAGNOSIS — A419 Sepsis, unspecified organism: Secondary | ICD-10-CM

## 2019-05-23 DIAGNOSIS — D696 Thrombocytopenia, unspecified: Secondary | ICD-10-CM

## 2019-05-23 DIAGNOSIS — R6521 Severe sepsis with septic shock: Secondary | ICD-10-CM

## 2019-05-23 LAB — CBC
HCT: 26.6 % — ABNORMAL LOW (ref 39.0–52.0)
Hemoglobin: 8.8 g/dL — ABNORMAL LOW (ref 13.0–17.0)
MCH: 29.2 pg (ref 26.0–34.0)
MCHC: 33.1 g/dL (ref 30.0–36.0)
MCV: 88.4 fL (ref 80.0–100.0)
Platelets: 48 10*3/uL — ABNORMAL LOW (ref 150–400)
RBC: 3.01 MIL/uL — ABNORMAL LOW (ref 4.22–5.81)
RDW: 16.7 % — ABNORMAL HIGH (ref 11.5–15.5)
WBC: 17.6 10*3/uL — ABNORMAL HIGH (ref 4.0–10.5)
nRBC: 0.3 % — ABNORMAL HIGH (ref 0.0–0.2)

## 2019-05-23 LAB — BASIC METABOLIC PANEL
Anion gap: 13 (ref 5–15)
BUN: 72 mg/dL — ABNORMAL HIGH (ref 8–23)
CO2: 20 mmol/L — ABNORMAL LOW (ref 22–32)
Calcium: 8.2 mg/dL — ABNORMAL LOW (ref 8.9–10.3)
Chloride: 102 mmol/L (ref 98–111)
Creatinine, Ser: 4.67 mg/dL — ABNORMAL HIGH (ref 0.61–1.24)
GFR calc Af Amer: 14 mL/min — ABNORMAL LOW (ref >60)
GFR calc non Af Amer: 12 mL/min — ABNORMAL LOW (ref >60)
Glucose, Bld: 175 mg/dL — ABNORMAL HIGH (ref 70–99)
Potassium: 4.5 mmol/L (ref 3.5–5.1)
Sodium: 135 mmol/L (ref 135–145)

## 2019-05-23 LAB — RENAL FUNCTION PANEL
Albumin: 2.5 g/dL — ABNORMAL LOW (ref 3.5–5.0)
Anion gap: 13 (ref 5–15)
BUN: 70 mg/dL — ABNORMAL HIGH (ref 8–23)
CO2: 22 mmol/L (ref 22–32)
Calcium: 7.9 mg/dL — ABNORMAL LOW (ref 8.9–10.3)
Chloride: 101 mmol/L (ref 98–111)
Creatinine, Ser: 4.15 mg/dL — ABNORMAL HIGH (ref 0.61–1.24)
GFR calc Af Amer: 16 mL/min — ABNORMAL LOW (ref >60)
GFR calc non Af Amer: 14 mL/min — ABNORMAL LOW (ref >60)
Glucose, Bld: 184 mg/dL — ABNORMAL HIGH (ref 70–99)
Phosphorus: 4.7 mg/dL — ABNORMAL HIGH (ref 2.5–4.6)
Potassium: 4.4 mmol/L (ref 3.5–5.1)
Sodium: 136 mmol/L (ref 135–145)

## 2019-05-23 LAB — POCT I-STAT 7, (LYTES, BLD GAS, ICA,H+H)
Acid-base deficit: 3 mmol/L — ABNORMAL HIGH (ref 0.0–2.0)
Bicarbonate: 21.9 mmol/L (ref 20.0–28.0)
Calcium, Ion: 1.16 mmol/L (ref 1.15–1.40)
HCT: 26 % — ABNORMAL LOW (ref 39.0–52.0)
Hemoglobin: 8.8 g/dL — ABNORMAL LOW (ref 13.0–17.0)
O2 Saturation: 88 %
Patient temperature: 37.8
Potassium: 4.5 mmol/L (ref 3.5–5.1)
Sodium: 135 mmol/L (ref 135–145)
TCO2: 23 mmol/L (ref 22–32)
pCO2 arterial: 40 mmHg (ref 32.0–48.0)
pH, Arterial: 7.349 — ABNORMAL LOW (ref 7.350–7.450)
pO2, Arterial: 60 mmHg — ABNORMAL LOW (ref 83.0–108.0)

## 2019-05-23 LAB — GLUCOSE, CAPILLARY
Glucose-Capillary: 124 mg/dL — ABNORMAL HIGH (ref 70–99)
Glucose-Capillary: 127 mg/dL — ABNORMAL HIGH (ref 70–99)
Glucose-Capillary: 142 mg/dL — ABNORMAL HIGH (ref 70–99)
Glucose-Capillary: 159 mg/dL — ABNORMAL HIGH (ref 70–99)
Glucose-Capillary: 161 mg/dL — ABNORMAL HIGH (ref 70–99)
Glucose-Capillary: 163 mg/dL — ABNORMAL HIGH (ref 70–99)
Glucose-Capillary: 169 mg/dL — ABNORMAL HIGH (ref 70–99)

## 2019-05-23 LAB — COOXEMETRY PANEL
Carboxyhemoglobin: 1.6 % — ABNORMAL HIGH (ref 0.5–1.5)
Methemoglobin: 0.8 % (ref 0.0–1.5)
O2 Saturation: 68.3 %
Total hemoglobin: 8.3 g/dL — ABNORMAL LOW (ref 12.0–16.0)

## 2019-05-23 LAB — BLOOD GAS, ARTERIAL
Acid-base deficit: 5.3 mmol/L — ABNORMAL HIGH (ref 0.0–2.0)
Bicarbonate: 20.5 mmol/L (ref 20.0–28.0)
O2 Saturation: 60.7 %
Patient temperature: 37
pCO2 arterial: 46.6 mmHg (ref 32.0–48.0)
pH, Arterial: 7.265 — ABNORMAL LOW (ref 7.350–7.450)
pO2, Arterial: 40.3 mmHg — ABNORMAL LOW (ref 83.0–108.0)

## 2019-05-23 LAB — ECHOCARDIOGRAM LIMITED
Height: 67 in
Weight: 2645.52 oz

## 2019-05-23 LAB — LACTATE DEHYDROGENASE: LDH: 771 U/L — ABNORMAL HIGH (ref 98–192)

## 2019-05-23 LAB — PHOSPHORUS: Phosphorus: 4.6 mg/dL (ref 2.5–4.6)

## 2019-05-23 LAB — APTT: aPTT: 76 seconds — ABNORMAL HIGH (ref 24–36)

## 2019-05-23 LAB — MAGNESIUM: Magnesium: 2 mg/dL (ref 1.7–2.4)

## 2019-05-23 LAB — PROCALCITONIN: Procalcitonin: 0.72 ng/mL

## 2019-05-23 MED ORDER — VITAL AF 1.2 CAL PO LIQD
1000.0000 mL | ORAL | Status: DC
  Administered 2019-05-23 – 2019-05-25 (×3): 1000 mL

## 2019-05-23 MED ORDER — SODIUM CHLORIDE 0.9% IV SOLUTION
Freq: Once | INTRAVENOUS | Status: AC
  Administered 2019-05-23: 09:00:00 1 mL via INTRAVENOUS

## 2019-05-23 MED ORDER — B COMPLEX-C PO TABS
1.0000 | ORAL_TABLET | Freq: Every day | ORAL | Status: DC
  Administered 2019-05-23 – 2019-05-25 (×3): 1 via ORAL
  Filled 2019-05-23 (×3): qty 1

## 2019-05-23 MED ORDER — HEPARIN SODIUM (PORCINE) 1000 UNIT/ML DIALYSIS
1000.0000 [IU] | INTRAMUSCULAR | Status: DC | PRN
  Administered 2019-05-28 – 2019-05-29 (×2): 3200 [IU] via INTRAVENOUS_CENTRAL
  Administered 2019-06-03: 2800 [IU] via INTRAVENOUS_CENTRAL
  Administered 2019-06-04: 09:00:00 3000 [IU] via INTRAVENOUS_CENTRAL
  Filled 2019-05-23: qty 3
  Filled 2019-05-23: qty 6
  Filled 2019-05-23: qty 4
  Filled 2019-05-23: qty 3
  Filled 2019-05-23: qty 6
  Filled 2019-05-23: qty 3
  Filled 2019-05-23: qty 6
  Filled 2019-05-23: qty 3
  Filled 2019-05-23 (×2): qty 6
  Filled 2019-05-23: qty 3
  Filled 2019-05-23: qty 6

## 2019-05-23 MED ORDER — SODIUM CHLORIDE 0.9 % IV SOLN
2.0000 g | Freq: Two times a day (BID) | INTRAVENOUS | Status: DC
  Administered 2019-05-23 – 2019-05-26 (×7): 2 g via INTRAVENOUS
  Filled 2019-05-23 (×8): qty 2

## 2019-05-23 MED ORDER — SODIUM CHLORIDE 0.9 % FOR CRRT
INTRAVENOUS_CENTRAL | Status: DC | PRN
  Filled 2019-05-23: qty 1000

## 2019-05-23 MED ORDER — HEPARIN SODIUM (PORCINE) 5000 UNIT/ML IJ SOLN
50000.0000 [IU] | INTRAVENOUS | Status: DC
  Administered 2019-05-23 – 2019-06-01 (×4): 50000 [IU]
  Filled 2019-05-23 (×11): qty 10

## 2019-05-23 MED ORDER — PRO-STAT SUGAR FREE PO LIQD: 30.0000 mL | Freq: Two times a day (BID) | ORAL | Status: DC

## 2019-05-23 MED ORDER — PRO-STAT SUGAR FREE PO LIQD
30.0000 mL | Freq: Three times a day (TID) | ORAL | Status: DC
  Administered 2019-05-23 – 2019-05-26 (×9): 30 mL
  Filled 2019-05-23 (×9): qty 30

## 2019-05-23 MED ORDER — PRISMASOL BGK 4/2.5 32-4-2.5 MEQ/L IV SOLN
INTRAVENOUS | Status: DC
  Administered 2019-05-23 (×2): 1 mL via INTRAVENOUS_CENTRAL
  Administered 2019-05-23: 21:00:00 via INTRAVENOUS_CENTRAL
  Administered 2019-05-23: 15:00:00 1 mL via INTRAVENOUS_CENTRAL
  Administered 2019-05-23 – 2019-05-24 (×3): via INTRAVENOUS_CENTRAL
  Administered 2019-05-24 (×3): 1 mL via INTRAVENOUS_CENTRAL
  Administered 2019-05-24: 22:00:00 via INTRAVENOUS_CENTRAL
  Administered 2019-05-24: 15:00:00 1 mL via INTRAVENOUS_CENTRAL
  Administered 2019-05-24 – 2019-05-28 (×30): via INTRAVENOUS_CENTRAL

## 2019-05-23 MED ORDER — VANCOMYCIN HCL IN DEXTROSE 750-5 MG/150ML-% IV SOLN
750.0000 mg | INTRAVENOUS | Status: DC
  Administered 2019-05-23 – 2019-05-24 (×2): 750 mg via INTRAVENOUS
  Filled 2019-05-23 (×2): qty 150

## 2019-05-23 MED ORDER — PRISMASOL BGK 4/2.5 32-4-2.5 MEQ/L REPLACEMENT SOLN
Status: DC
  Administered 2019-05-23: 1 via INTRAVENOUS_CENTRAL
  Administered 2019-05-23: 23:00:00 via INTRAVENOUS_CENTRAL
  Administered 2019-05-24: 1 via INTRAVENOUS_CENTRAL
  Administered 2019-05-25 – 2019-05-28 (×7): via INTRAVENOUS_CENTRAL

## 2019-05-23 MED ORDER — PRISMASOL BGK 4/2.5 32-4-2.5 MEQ/L REPLACEMENT SOLN
Status: DC
  Administered 2019-05-23: 23:00:00 via INTRAVENOUS_CENTRAL
  Administered 2019-05-23: 1 via INTRAVENOUS_CENTRAL
  Administered 2019-05-24: 20:00:00 via INTRAVENOUS_CENTRAL
  Administered 2019-05-24: 09:00:00 1 via INTRAVENOUS_CENTRAL
  Administered 2019-05-25 – 2019-05-27 (×6): via INTRAVENOUS_CENTRAL

## 2019-05-23 NOTE — Progress Notes (Signed)
[facility] ROUNDING NOTE   Subjective:   68 year old gentleman with a history of cardiogenic shock status post cardiac catheterization 05/25/2019 with severe three-vessel coronary disease.  Echocardiogram reveals ejection fraction of 20%.  Impella device placed 05/23/2019 with switch to 5.0 Impella 05/05/2019.  Continues on milrinone 0.25 mcg and norepinephrine 3 mcg.  Complicated by pneumonia treated with vancomycin/cefepime.  Although it appears that these antibiotics have now been discontinued.  Considering patient for CABG. patient underwent bronchoscopy 05/22/2019 appreciate assistance from critical care medicine  Blood pressure 104/66 pulse 90 temperature 99.5 O2 sats 100% FiO2 70% CVP increased to 19.  Sodium 135 potassium 4.5 chloride 102 CO2 20 BUN 72 creatinine 4.67 glucose 175 calcium 8.2 phosphorus 4.6 magnesium 2.0 WBC 17.6 hemoglobin 8.8 platelets 48   Aspirin 81 mg daily Lipitor 80 mg daily, Solu-Medrol 40 mg every 12 hours, Protonix 40 mg daily   Lasix 80 mg IV administered 05/21/2019 with brisk urine output.  Now placed on Lasix drip  Urine output 2.1 L urine output 05/23/2019  IV amiodarone IV Angiomax IV milrinone IV norepinephrine IV Maxipime 2 g every 24 hours IV Lasix 12 mg/h IV vancomycin 05/22/2019   Objective:  Vital signs in last 24 hours:  Temp:  [97.9 F (36.6 C)-101.3 F (38.5 C)] 99.7 F (37.6 C) (10/29 0857) Pulse Rate:  [81-105] 105 (10/29 0325) Resp:  [15-29] 17 (10/29 0857) BP: (68-121)/(56-91) 99/81 (10/29 0800) SpO2:  [88 %-100 %] 100 % (10/29 0857) Arterial Line BP: (79-135)/(48-75) 99/62 (10/29 0857) FiO2 (%):  [40 %-70 %] 70 % (10/29 0733) Weight:  [75 kg] 75 kg (10/29 0403)  Weight change: 0.5 kg Filed Weights   05/21/19 0500 05/22/19 0500 05/23/19 0403  Weight: 74.6 kg 74.5 kg 75 kg    Intake/Output: I/O last 3 completed shifts: In: 5806 [I.V.:2756.9; Other:570.5; NG/GT:2125; IV Piggyback:353.6] Out: 2845  [Urine:2845]   Intake/Output this shift:  Total I/O In: 118.4 [I.V.:101.4; Other:17] Out: 0       General: Sedated NAD HEENT: MMM [place] AT anicteric sclera, orally intubated Neck:  No JVD, no adenopathy CV:  Heart RRR  Lungs:  L/S CTA bilaterally Abd:  abd SNT/ND with normal BS GU:  Bladder non-palpable, positive Foley with clear urine Extremities: +2 bilateral lower extremity edema Skin:  No skin rash   Basic Metabolic Panel: Recent Labs  Lab 05/23/2019 1610  05/19/19 0434 05/20/19 6789  05/21/19 0406  05/22/19 0225 05/22/19 0439 05/22/19 0815 05/22/19 1430 05/23/19 0259 05/23/19 0307  NA  --    < > 138 137   < > 136   < > 139 141 141 139 135 135  K  --    < > 3.6 3.3*   < > 3.3*   < > 3.5 3.2* 3.4* 4.2 4.5 4.5  CL  --    < > 102 104  --  106  --  106  --   --  105 102  --   CO2  --    < > 25 22  --  20*  --  22  --   --  22 20*  --   GLUCOSE  --    < > 168* 176*  --  133*  --  145*  --   --  126* 175*  --   BUN  --    < > 51* 56*  --  58*  --  58*  --   --  61* 72*  --  CREATININE  --    < > 3.49* 3.90*  --  3.87*  --  3.55*  --   --  3.85* 4.67*  --   CALCIUM  --    < > 7.8* 7.5*  --  7.9*  --  8.0*  --   --  8.2* 8.2*  --   MG 2.3  --  2.0 2.0  --  2.1  --   --   --   --   --  2.0  --   PHOS 3.8  --  3.7 3.4  --  2.5  --   --   --   --   --  4.6  --    < > = values in this interval not displayed.    Liver Function Tests: Recent Labs  Lab 05/15/2019 1200 05/10/2019 2127 05/19/19 0434 05/20/19 0637 05/21/19 0406  AST 130* 118* 64* 27 22  ALT _0 ALKPHOS 63 61 53 48 56  BILITOT 2.7* 1.8* 1.1 1.0 1.1  PROT 5.7* 5.3* 4.8* 4.8* 4.7*  ALBUMIN 2.7* 2.5* 2.2* 2.2* 2.2*   No results for input(s): LIPASE, AMYLASE in the last 168 hours. No results for input(s): AMMONIA in the last 168 hours.  CBC: Recent Labs  Lab 05/14/2019 1834  04/29/2019 0900  04/29/2019 1504  05/21/2019 2127  05/19/19 0434 05/20/19 3007  05/21/19 0406  05/22/19 0225 05/22/19 0439  05/22/19 0815 05/23/19 0259 05/23/19 0307  WBC 19.6*   < > 20.8*  --  15.9*   < > 22.9*  --  23.0* 22.8*  --  13.4*  --  10.3  --   --  17.6*  --   NEUTROABS 17.1*  --  17.8*  --  13.3*  --  17.4*  --   --   --   --   --   --   --   --   --   --   --   HGB 17.2*   < > 16.3   < > 14.7   < > 11.7*   < > 9.5* 7.9*   < > 7.4*   < > 9.0* 7.5* 8.5* 8.8* 8.8*  HCT 53.3*   < > 51.1   < > 45.8   < > 33.3*   < > 27.8* 23.4*   < > 22.4*   < > 28.3* 22.0* 25.0* 26.6* 26.0*  MCV 94.3   < > 93.1  --  94.2   < > 87.4  --  88.8 90.7  --  91.1  --  89.8  --   --  88.4  --   PLT 213   < > 204  --  149*   < > 120*  --  89* 66*  --  36*  --  30*  --   --  48*  --    < > = values in this interval not displayed.    Cardiac Enzymes: No results for input(s): CKTOTAL, CKMB, CKMBINDEX, TROPONINI in the last 168 hours.  BNP: Invalid input(s): POCBNP  CBG: Recent Labs  Lab 05/22/19 1625 05/22/19 1952 05/23/19 0026 05/23/19 0305 05/23/19 0818  GLUCAP 157* 134* 159* 161* 60*    Microbiology: Results for orders placed or performed during the hospital encounter of 05/11/2019  Urine culture     Status: None   Collection Time: 05/20/2019 12:16 AM   Specimen: Urine, Random  Result Value Ref Range Status  Specimen Description URINE, RANDOM  Final   Special Requests NONE  Final   Culture   Final    NO GROWTH Performed at [facility], [place] [address] [place], [place] [address]    Report Status 05/09/2019 FINAL  Final  Respiratory Panel by PCR     Status: None   Collection Time: 05/22/2019  1:58 AM   Specimen: Nasopharyngeal Swab; Respiratory  Result Value Ref Range Status   Adenovirus NOT DETECTED NOT DETECTED Final   Coronavirus 229E NOT DETECTED NOT DETECTED Final    Comment: (NOTE) The Coronavirus on the Respiratory Panel, DOES NOT test for the novel  Coronavirus (2019 nCoV)    Coronavirus HKU1 NOT DETECTED NOT DETECTED Final   Coronavirus NL63 NOT DETECTED NOT DETECTED Final   Coronavirus  OC43 NOT DETECTED NOT DETECTED Final   Metapneumovirus NOT DETECTED NOT DETECTED Final   Rhinovirus / Enterovirus NOT DETECTED NOT DETECTED Final   Influenza A NOT DETECTED NOT DETECTED Final   Influenza B NOT DETECTED NOT DETECTED Final   Parainfluenza Virus 1 NOT DETECTED NOT DETECTED Final   Parainfluenza Virus 2 NOT DETECTED NOT DETECTED Final   Parainfluenza Virus 3 NOT DETECTED NOT DETECTED Final   Parainfluenza Virus 4 NOT DETECTED NOT DETECTED Final   Respiratory Syncytial Virus NOT DETECTED NOT DETECTED Final   Bordetella pertussis NOT DETECTED NOT DETECTED Final   Chlamydophila pneumoniae NOT DETECTED NOT DETECTED Final   Mycoplasma pneumoniae NOT DETECTED NOT DETECTED Final    Comment: Performed at [facility], [place] [address] [place], [place] [address]  SARS Coronavirus 2 by RT PCR (hospital order, performed in [facility] hospital lab) Nasopharyngeal Nasopharyngeal Swab     Status: None   Collection Time: 05/12/2019  3:55 PM   Specimen: Nasopharyngeal Swab  Result Value Ref Range Status   SARS Coronavirus 2 NEGATIVE NEGATIVE Final    Comment: (NOTE) If result is NEGATIVE SARS-CoV-2 target nucleic acids are NOT DETECTED. The SARS-CoV-2 RNA is generally detectable in upper and lower  respiratory specimens during the acute phase of infection. The lowest  concentration of SARS-CoV-2 viral copies this assay can detect is 250  copies / mL. A negative result does not preclude SARS-CoV-2 infection  and should not be used as the sole basis for treatment or other  patient management decisions.  A negative result may occur with  improper specimen collection / handling, submission of specimen other  than nasopharyngeal swab, presence of viral mutation(s) within the  areas targeted by this assay, and inadequate number of viral copies  (<250 copies / mL). A negative result must be combined with clinical  observations, patient history, and epidemiological information. If result is  POSITIVE SARS-CoV-2 target nucleic acids are DETECTED. The SARS-CoV-2 RNA is generally detectable in upper and lower  respiratory specimens dur ing the acute phase of infection.  Positive  results are indicative of active infection with SARS-CoV-2.  Clinical  correlation with patient history and other diagnostic information is  necessary to determine patient infection status.  Positive results do  not rule out bacterial infection or co-infection with other viruses. If result is PRESUMPTIVE POSTIVE SARS-CoV-2 nucleic acids MAY BE PRESENT.   A presumptive positive result was obtained on the submitted specimen  and confirmed on repeat testing.  While 2019 novel coronavirus  (SARS-CoV-2) nucleic acids may be present in the submitted sample  additional confirmatory testing may be necessary for epidemiological  and / or clinical management purposes  to differentiate between  SARS-CoV-2 and other Sarbecovirus currently known to infect humans.  If clinically indicated additional testing with an alternate test  methodology [id number]) is advised. The SARS-CoV-2 RNA is generally  detectable in upper and lower respiratory sp ecimens during the acute  phase of infection. The expected result is Negative. Fact Sheet for Patients:  StrictlyIdeas.no Fact Sheet for Healthcare Providers: BankingDealers.co.za This test is not yet approved or cleared by the [place] FDA and has been authorized for detection and/or diagnosis of SARS-CoV-2 by FDA under an Emergency Use Authorization (EUA).  This EUA will remain in effect (meaning this test can be used) for the duration of the COVID-19 declaration under Section 564(b)(1) of the Act, 21 U.S.C. section 360bbb-3(b)(1), unless the authorization is terminated or revoked sooner. Performed at [facility], [place] [address], [place], [place] [address]   Blood culture (routine x 2)     Status: None    Collection Time: 05/01/2019  9:00 PM   Specimen: BLOOD  Result Value Ref Range Status   Specimen Description BLOOD LEFT ANTECUBITAL  Final   Special Requests   Final    BOTTLES DRAWN AEROBIC AND ANAEROBIC Blood Culture adequate volume   Culture   Final    NO GROWTH 5 DAYS Performed at [facility], [place] [address] [place], [place] [address]    Report Status 05/21/2019 FINAL  Final  Blood culture (routine x 2)     Status: None   Collection Time: 05/05/2019  9:00 PM   Specimen: BLOOD RIGHT HAND  Result Value Ref Range Status   Specimen Description BLOOD RIGHT HAND  Final   Special Requests   Final    AEROBIC BOTTLE ONLY Blood Culture results may not be optimal due to an inadequate volume of blood received in culture bottles   Culture   Final    NO GROWTH 5 DAYS Performed at [facility], [place] [address], [place], [place] [address]    Report Status 05/21/2019 FINAL  Final  MRSA PCR Screening     Status: None   Collection Time: 04/29/2019 11:20 PM   Specimen: Nasal Mucosa; Nasopharyngeal  Result Value Ref Range Status   MRSA by PCR NEGATIVE NEGATIVE Final    Comment:        The GeneXpert MRSA Assay (FDA approved for NASAL specimens only), is one component of a comprehensive MRSA colonization surveillance program. It is not intended to diagnose MRSA infection nor to guide or monitor treatment for MRSA infections. Performed at [facility], [place] [address] [place], [place] [address]   Culture, respiratory (tracheal aspirate)     Status: None   Collection Time: 05/21/2019  3:50 AM   Specimen: Tracheal Aspirate; Respiratory  Result Value Ref Range Status   Specimen Description TRACHEAL ASPIRATE  Final   Special Requests NONE  Final   Gram Stain   Final    RARE WBC PRESENT, PREDOMINANTLY PMN RARE GRAM POSITIVE COCCI IN PAIRS    Culture   Final    RARE Consistent with normal respiratory flora. Performed at [facility], [place] [address], [place], [place]  [address]    Report Status 05/20/2019 FINAL  Final  Culture, bal-quantitative     Status: None (Preliminary result)   Collection Time: 05/22/19 11:05 AM   Specimen: Bronchoalveolar Lavage; Respiratory  Result Value Ref Range Status   Specimen Description BRONCHIAL ALVEOLAR LAVAGE  Final   Special Requests Normal  Final   Gram Stain   Final    RARE WBC PRESENT, PREDOMINANTLY PMN NO ORGANISMS SEEN Performed at [facility], [place] [address] [place], [place] [address]    Culture PENDING  Incomplete   Report Status PENDING  Incomplete    Coagulation Studies: No results for input(s): LABPROT, INR in the last 72 hours.  Urinalysis: No results for input(s): COLORURINE, LABSPEC, PHURINE, GLUCOSEU, HGBUR, BILIRUBINUR, KETONESUR, PROTEINUR, UROBILINOGEN, NITRITE, LEUKOCYTESUR in the last 72 hours.  Invalid input(s): APPERANCEUR    Imaging: Dg Chest Port 1 View  Result Date: 05/23/2019 CLINICAL DATA:  Respiratory failure. EXAM: PORTABLE CHEST 1 VIEW COMPARISON:  Chest x-ray 05/22/2019 FINDINGS: The endotracheal tube is 4.4 cm above the carina. Right IJ Swan-Ganz catheter tip is in the proximal right pulmonary artery. Impella device and good position without complicating features. Right PICC line is stable. NG tube coursing down the esophagus and into the stomach. Stable appearance of the heart and mediastinum. Stable calcification of the thoracic aorta. Increasing density at the left lung base could be progressive atelectasis or infiltrate. Underlying edema and atelectasis appears stable. IMPRESSION: 1. Support apparatus in good position without complicating features. 2. Increasing density at the left lung base could be progressive atelectasis or infiltrate. 3. Stable underlying edema and atelectasis. Electronically Signed   By: Marijo Sanes M.D.   On: 05/23/2019 07:52   Dg Chest Port 1 View  Result Date: 05/22/2019 CLINICAL DATA:  Status post bronchoscopy. EXAM: PORTABLE CHEST 1 VIEW  COMPARISON:  Same day. FINDINGS: Stable cardiomediastinal silhouette. No pneumothorax or pleural effusion is noted. Endotracheal and nasogastric tubes are unchanged in position. Left ventricular assist device is unchanged. Right internal jugular Swan-Ganz catheter is unchanged. No acute pulmonary disease is noted. Bony thorax is unremarkable. IMPRESSION: Stable support apparatus. No acute cardiopulmonary abnormality seen. Electronically Signed   By: Marijo Conception M.D.   On: 05/22/2019 11:36   Dg Chest Port 1 View  Result Date: 05/22/2019 CLINICAL DATA:  Respiratory distress EXAM: PORTABLE CHEST 1 VIEW COMPARISON:  05/21/2019 FINDINGS: Cardiac shadow is stable. Endotracheal tube, gastric catheter and Swan-Ganz catheter are noted in satisfactory position. Right-sided PICC line and Impella catheter are seen and stable. The lungs are well aerated bilaterally. No focal infiltrate or sizable effusion is seen. No bony abnormality is seen. IMPRESSION: Tubes and lines as described above stable from the previous exam. No acute abnormality noted. Electronically Signed   By: Inez Catalina M.D.   On: 05/22/2019 01:33   Vas [place] Upper Extremity Venous Duplex  Result Date: 05/22/2019 UPPER VENOUS STUDY  Indications: Edema Other Indications: Impella, PICC line, IV, all in right chest/arm. Limitations: Line and bandages. Comparison Study: No prior study on file for comparison. Performing Technologist: Sharion Dove RVS  Examination Guidelines: A complete evaluation includes B-mode imaging, spectral Doppler, color Doppler, and power Doppler as needed of all accessible portions of each vessel. Bilateral testing is considered an integral part of a complete examination. Limited examinations for reoccurring indications may be performed as noted.  Right Findings: +----------+------------+---------+-----------+----------+---------------------+ RIGHT     CompressiblePhasicitySpontaneousProperties       Summary         +----------+------------+---------+-----------+----------+---------------------+ IJV  Not visualized     +----------+------------+---------+-----------+----------+---------------------+ Subclavian               Yes       Yes               patent by color and                                                             Doppler        +----------+------------+---------+-----------+----------+---------------------+ Axillary                                               Not visualized     +----------+------------+---------+-----------+----------+---------------------+ Brachial      Full       Yes       Yes                                    +----------+------------+---------+-----------+----------+---------------------+ Radial        Full                                                        +----------+------------+---------+-----------+----------+---------------------+ Ulnar                                                  patent by color    +----------+------------+---------+-----------+----------+---------------------+ Cephalic      Full                                                        +----------+------------+---------+-----------+----------+---------------------+ Basilic       Full                                                        +----------+------------+---------+-----------+----------+---------------------+  Left Findings: +----------+------------+---------+-----------+----------+-------+ LEFT      CompressiblePhasicitySpontaneousPropertiesSummary +----------+------------+---------+-----------+----------+-------+ Subclavian               Yes       Yes                      +----------+------------+---------+-----------+----------+-------+  Summary:  Right: No evidence of deep vein thrombosis in the upper extremity. However, unable to visualize the IJV, axillary. No evidence of  superficial vein thrombosis in the upper extremity. However, unable to visualize the IJV, axillary. No evidence of thrombosis in the . However, unable to visualize the IJV, axillary.  Left: No evidence of thrombosis in the subclavian.  *See table(s) above  for measurements and observations.  Diagnosing physician: Deitra Mayo MD Electronically signed by Deitra Mayo MD on 05/22/2019 at 6:24:27 AM.    Final      Medications:   . sodium chloride 10 mL/hr at 05/20/19 0910  . sodium chloride Stopped (05/22/19 1301)  . sodium chloride 10 mL/hr at 05/23/19 0800  . sodium chloride    . amiodarone 30 mg/hr (05/23/19 0800)  . ceFEPime (MAXIPIME) IV    . feeding supplement (VITAL AF 1.2 CAL) 1,000 mL (05/23/19 0925)  . fentaNYL infusion INTRAVENOUS 375 mcg/hr (05/23/19 0800)  . furosemide (LASIX) infusion 12 mg/hr (05/23/19 0832)  . impella catheter heparin 50 unit/mL in dextrose 5% 50,000 Units (05/23/19 0815)  . midazolam 3 mg/hr (05/23/19 0800)  . milrinone 0.25 mcg/kg/min (05/23/19 0800)  . norepinephrine (LEVOPHED) Adult infusion 10 mcg/min (05/23/19 0800)   . sodium chloride   Intravenous Once  . arformoterol  15 mcg Nebulization BID  . aspirin  81 mg Per Tube Daily  . atorvastatin  80 mg Per Tube q1800  . budesonide (PULMICORT) nebulizer solution  0.5 mg Nebulization BID  . chlorhexidine gluconate (MEDLINE KIT)  15 mL Mouth Rinse BID  . Chlorhexidine Gluconate Cloth  6 each Topical Daily  . insulin aspart  1-3 Units Subcutaneous Q4H  . ipratropium  0.5 mg Nebulization Q6H  . lidocaine-EPINEPHrine  20 mL Infiltration Once  . mouth rinse  15 mL Mouth Rinse 10 times per day  . methylPREDNISolone (SOLU-MEDROL) injection  40 mg Intravenous Q12H  . pantoprazole (PROTONIX) IV  40 mg Intravenous QHS  . potassium chloride  20 mEq Oral Once  . sodium chloride flush  10-40 mL Intracatheter Q12H  . sodium chloride flush  3 mL Intravenous Q12H  . sodium chloride flush  3 mL  Intravenous Q12H  . vancomycin variable dose per unstable renal function (pharmacist dosing)   Does not apply See admin instructions   sodium chloride, Place/Maintain arterial line **AND** sodium chloride, sodium chloride, docusate, fentaNYL, fentaNYL (SUBLIMAZE) injection, fentaNYL (SUBLIMAZE) injection, influenza vaccine adjuvanted, ipratropium-albuterol, midazolam, midazolam, pneumococcal 23 valent vaccine, sodium chloride flush, sodium chloride flush  Assessment/ Plan:  1.Baseline serum creatinine 1.34 on admission.  2. Acute kidney injury. Likely secondary to decreased cardiac output in the setting of congestive heart failure. He may have had some component of contrast-induced nephropathy.     Continue to avoid nephrotoxins no ACE inhibitor's ARB use nonsteroidal inflammatory drugs IV contrast.  Creatinine appears to have worsened.  Blood pressure tenuous.  Oxygen requirements of increased.  Appreciate assistance from critical care medicine with bronchoscopy.  Have discussed with Dr. Kirk Ruths and will initiate CRRT.  3.  Hypertension/volume discontinue Lasix drip once started on CRRT  3. Coronary artery disease. Triple-vessel disease noted on cardiac catheterization. For possible CABG. 75% distal left main stenosis  4.Acute hypoxemic respiratory failure. Secondary to congestive heart failure.   Looks relatively euvolemic.   5. Acute systolic congestive heart failure. Ejection fraction less than 20%. Continue Impella device support.   Being considered for possible CABG.  6.  Hypotension/volume continues on inotropic support.  Milrinone and Levophed  7.  Pneumonia.  Vancomycin, Maxipime and IV steroids administered 05/22/2019  8.  Hypokalemia repleted by primary service   LOS: [place] _0 _1 :31 AM

## 2019-05-23 NOTE — Progress Notes (Addendum)
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  No Known Allergies  Patient Measurements: Height: _0  (170.2 cm)(measured x3) Weight: 165 lb 5.5 oz (75 kg) IBW/kg (Calculated) : 66.1  Vital Signs: Temp: 101.3 F (38.5 C) (10/29 0600) BP: 97/79 (10/29 0600) Pulse Rate: 105 (10/29 0325)  Labs: Recent Labs    05/21/19 0406  05/22/19 0225  05/22/19 0447 05/22/19 0815 05/22/19 1430 05/22/19 1700 05/23/19 0259 05/23/19 0307  HGB 7.4*   < > 9.0*   < >  --  8.5*  --   --  8.8* 8.8*  HCT 22.4*   < > 28.3*   < >  --  25.0*  --   --  26.6* 26.0*  PLT 36*  --  30*  --   --   --   --   --  48*  --   APTT  --    < >  --   --  73*  --   --  78* 76*  --   HEPARINUNFRC 0.15*  --   --   --   --   --   --   --   --   --   CREATININE 3.87*  --  3.55*  --   --   --  3.85*  --  4.67*  --    < > = values in this interval not displayed.    Estimated Creatinine Clearance: 14 mL/min (A) (by C-G formula based on SCr of 4.67 mg/dL (H)).   Medical History: Past Medical History:  Diagnosis Date  . Acute respiratory failure with hypoxemia ([place]) 04/2019  . Carotid artery occlusion   . Hypertension     Medications:  Scheduled:  . arformoterol  15 mcg Nebulization BID  . aspirin  81 mg Per Tube Daily  . atorvastatin  80 mg Per Tube q1800  . budesonide (PULMICORT) nebulizer solution  0.5 mg Nebulization BID  . chlorhexidine gluconate (MEDLINE KIT)  15 mL Mouth Rinse BID  . Chlorhexidine Gluconate Cloth  6 each Topical Daily  . insulin aspart  1-3 Units Subcutaneous Q4H  . ipratropium  0.5 mg Nebulization Q6H  . lidocaine-EPINEPHrine  20 mL Infiltration Once  . mouth rinse  15 mL Mouth Rinse 10 times per day  . methylPREDNISolone (SOLU-MEDROL) injection  40 mg Intravenous Q12H  . pantoprazole (PROTONIX) IV  40 mg Intravenous QHS  . potassium chloride  20 mEq Oral Once  . sodium chloride flush  10-40 mL Intracatheter  Q12H  . sodium chloride flush  3 mL Intravenous Q12H  . sodium chloride flush  3 mL Intravenous Q12H  . vancomycin variable dose per unstable renal function (pharmacist dosing)   Does not apply See admin instructions   Infusions:  . sodium chloride 10 mL/hr at 05/20/19 0910  . sodium chloride Stopped (05/22/19 1301)  . sodium chloride 10 mL/hr at 05/23/19 0600  . sodium chloride    . amiodarone 30 mg/hr (05/23/19 0600)  . bivalirudin (ANGIOMAX) infusion 0.5 mg/mL (Non-ACS indications) 0.05 mg/kg/hr (05/23/19 0600)  . ceFEPime (MAXIPIME) IV    . dexmedetomidine (PRECEDEX) IV infusion Stopped (05/22/19 0359)  . dextrose 5 % Impella 5.0 Purge solution    . feeding supplement (VITAL AF 1.2 CAL) 1,000 mL (05/22/19 1400)  . fentaNYL infusion INTRAVENOUS 350 mcg/hr (05/23/19 0600)  . furosemide (LASIX) infusion 12 mg/hr (05/23/19 0600)  . midazolam 3 mg/hr (05/23/19 0600)  .  milrinone 0.25 mcg/kg/min (05/23/19 0600)  . norepinephrine (LEVOPHED) Adult infusion 10 mcg/min (05/23/19 3202)    Assessment: Raymond Chavez admitted with shock and concern for ACS now s/p Impella CP placement in cath lab.  Impella CP swapped out for 5.0 d/t hemolysis. LDH up 538. Pltc dropping from 200s baseline but trending up slightly to 48 sec this AM. Heparin stopped on 10/27 with concern for HIT. Purge solution now with D5W only, running at 15.2 ml/hr. Pharmacy consulted to start bivalirudin for systemic anticoagulation while HIT antibody pending.   aPTT continues to be within goal at 76 sec. No bleeding issues noted. HIT antibody is negative.  Goal of Therapy:  aPTT 50-85 seconds Monitor platelets by anticoagulation protocol: Yes   Plan:  Continue bivalirudin at 0.05 mg/kg/hr Will follow up with MD today about resuming heparin in purge Daily aPTT, CBC Monitor for bleeding  Addendum: bivalirudin off with HIT ab negative. Heparin in purge only resumed. Hold off starting systemic heparin pending better recovery in  pltc.  Vertis Kelch, PharmD PGY2 Cardiology Pharmacy Resident Phone [phone] 05/23/2019       7:09 AM  Please check [email or web address] for unit-specific pharmacist phone numbers

## 2019-05-23 NOTE — Procedures (Signed)
Hemodialysis Catheter Insertion Procedure Note Raymond Chavez [id number] Aug 06, 1950  Procedure: Insertion of Hemodialysis Catheter Indications: Dialysis Access   Procedure Details Consent: Risks of procedure as well as the alternatives and risks of each were explained to the (patient/caregiver).  Consent for procedure obtained. Time Out: Verified patient identification, verified procedure, site/side was marked, verified correct patient position, special equipment/implants available, medications/allergies/relevent history reviewed, required imaging and test results available.  Performed  Maximum sterile technique was used including antiseptics, cap, gloves, gown, hand hygiene, mask and sheet. Skin prep: Chlorhexidine; local anesthetic administered Triple lumed hemodialysis catheter was inserted into left internal jugular vein using the Seldinger technique.  Evaluation Blood flow good Complications: No apparent complications Patient did tolerate procedure well. Chest X-ray ordered to verify placement.  CXR: pending.   Eliseo Gum MSN, AGACNP-BC [place] [phone] If no answer, [phone] 05/23/2019, 11:57 AM

## 2019-05-23 NOTE — Progress Notes (Signed)
Nutrition Follow-up   DOCUMENTATION CODES:   Not applicable  INTERVENTION:   Monitor for BM- day 6 without  Decrease tube feeding: - Vital AF 1.2 @ 50 ml/hr via OGT (1320 ml/day) - Add 30 ml Prostat TID - Add B complex with Vitamin C  Tube feeding regimen provides 1740 kcal, 135 grams of protein, and 973 ml of H2O.   NUTRITION DIAGNOSIS:   Inadequate oral intake related to inability to eat as evidenced by NPO status.  Ongoing  GOAL:   Patient will meet greater than or equal to 90% of their needs   Addressed via TF  MONITOR:   Vent status, Labs, Weight trends, Skin, I & O's  REASON FOR ASSESSMENT:   Ventilator    ASSESSMENT:   68 year old male who presented to the ED on 10/22 with chest pain and SOB. PMH of CAD, HTN, MI in 1998. Pt admitted with acute hypoxic respiratory failure due to acute pulmonary edema, PNA, possible ACS/NSTEMI. Pt required intubation.   10/23- s/p right/left heart cath 10/24- impella changed from femoral to R axillary 10/28- s/p bronchoscopy   Pt discussed during ICU rounds and with RN.   Continues on pressors. Cr worse- start CRRT. UOP better with lasix gtt. Tolerating TF well at goal rate. Will make adjustments to provide more protein. Awaiting BM, RN made aware.   Patient remains intubated on ventilator support MV: 8 L/min Temp (24hrs), Avg:99.6 F (37.6 C), Min:98.4 F (36.9 C), Max:101.3 F (38.5 C)    Admission weight: 72.6 kg  Current weight: 75 kg    I/O: +9,425 ml since admit UOP: 1,880 ml x 24 hrs   Drips: amiodarone, milrinone, levophed Medications: SS novolog, solumedrol Labs: Cr 4.67- trending up CBG 102-175   Diet Order:   Diet Order    None      EDUCATION NEEDS:   No education needs have been identified at this time  Skin:  Skin Assessment: Skin Integrity Issues: Skin Integrity Issues:: Incisions Incisions: R chest  Last BM:  PTA  Height:   Ht Readings from Last 1 Encounters:  04/29/2019 5\' 7"   (1.702 m)    Weight:   Wt Readings from Last 1 Encounters:  05/23/19 75 kg    Ideal Body Weight:  67.3 kg  BMI:  Body mass index is 25.9 kg/m.  Estimated Nutritional Needs:   Kcal:  1687 kcal  Protein:  120-135 grams  Fluid:  >/= 1.6 L/day  Mariana Single RD, LDN Clinical Nutrition Pager # - [phone]

## 2019-05-23 NOTE — TOC Initial Note (Signed)
Transition of Care [facility]) - Initial/Assessment Note    Patient Details  Name: Mylin Hirano MRN: [id number] Date of Birth: Dec 15, 1950  [facility] CM/SW Contact:    Midge Minium RN, BSN, NCM-BC, ACM-RN [phone] (working remotely) Phone Number: 05/23/2019, 3:44 PM  Clinical Narrative:                 Patient remains critically ill and intubated. CM team is following for transitional needs.  Expected Discharge Plan: [place] Barriers to Discharge: Continued Medical Work up   Expected Discharge Plan and Services Expected Discharge Plan: [place]   Discharge Planning Services: CM Consult   Living arrangements for the past 2 months: Apartment                    Prior Living Arrangements/Services Living arrangements for the past 2 months: Apartment Lives with:: Self    Activities of Daily Living Home Assistive Devices/Equipment: None ADL Screening (condition at time of admission) Patient's cognitive ability adequate to safely complete daily activities?: Yes Is the patient deaf or have difficulty hearing?: No Does the patient have difficulty seeing, even when wearing glasses/contacts?: No Does the patient have difficulty concentrating, remembering, or making decisions?: No Patient able to express need for assistance with ADLs?: Yes Does the patient have difficulty dressing or bathing?: No Independently performs ADLs?: Yes (appropriate for developmental age) Does the patient have difficulty walking or climbing stairs?: No Weakness of Legs: None Weakness of Arms/Hands: None   Admission diagnosis:  Acute hypoxemic respiratory failure ([place]) [J96.01] Patient Active Problem List   Diagnosis Date Noted  . Acute renal failure ([place])   . Respiratory failure ([place])   . Cardiogenic shock ([place])   . Acute hypoxemic respiratory failure ([place]) 05/15/2019  . NSTEMI (non-ST elevated myocardial infarction) ([place])   . Acute pulmonary edema  ([facility])   . Community acquired pneumonia   . AKI (acute kidney injury) ([place])    PCP:  Alroy Dust, L.Marlou Sa, MD Pharmacy:   [id number] - [place], [place] [address] [place] [address] Phone: [phone] Fax: [phone]     Social Determinants of Health (SDOH) Interventions    Readmission Risk Interventions Readmission Risk Prevention Plan 05/23/2019  Transportation Screening Complete  PCP or Specialist Appt within 3-5 Days Not Complete  Not Complete comments Remains critically ill; not stable for discharge  [place] or [place] Complete  Social Work Consult for [place] Planning/Counseling Complete  Palliative Care Screening Not Applicable  Medication Review Press photographer) Complete  Some recent data might be hidden

## 2019-05-23 NOTE — Progress Notes (Signed)
eLink Physician-Brief Progress Note Patient Name: Raymond Chavez DOB: 08/02/1950 MRN: [id number]   Date of Service  05/23/2019  HPI/Events of Note  Notified of hypoxia and tachypnea.  Pt is intubated and sedated initially for pulmonary edema, pneumonia, NSTEMI.   PT is being diuresed on Lasix gtt, but with minimal urine output now.  He is on levophed as well.  Pt is on fentanyl and versed gtt.  ABG reviewed - shows big decline in pAO2.    eICU Interventions  FiO2 increased from 40 to 60% with sats at 92%. Check CXR.  Increase sedation with versed. Continue fentanyl.  Pt may need CRRT if CXR shows pulmonary edema, as pt is not responding to IV Lasix.      Intervention Category Intermediate Interventions: Respiratory distress - evaluation and management  Elsie Lincoln 05/23/2019, 5:13 AM

## 2019-05-23 NOTE — Progress Notes (Signed)
Pharmacy Antibiotic Note  Raymond Chavez is a 68 y.o. male admitted on 05/15/2019 with chest pain and shortness of breath.  Pharmacy has been consulted for cefepime and vancomycin dosing.  Bronch lavage showing no organisms on culture but body fluid showing bacteria. WBC up 17.6 (on steroids), temp up to 101.3. Scr 4.67 (CrCl 14 mL/min). Now on CRRT (started ~ 1300)  Plan: Cefepime 2g IV q12h Vancomycin 750 mg IV q24h Monitor Scr, clinical pic, cx results, CRRT duration, and vancomycin level as needed  Height: _0  (170.2 cm)(measured x3) Weight: (S) 171 lb 15.3 oz (78 kg) IBW/kg (Calculated) : 66.1  Temp (24hrs), Avg:99.6 F (37.6 C), Min:98.6 F (37 C), Max:101.3 F (38.5 C)  Recent Labs  Lab 05/01/2019 1503  05/20/2019 1752  05/20/2019 0448 05/08/2019 1200  05/13/2019 2327 05/19/19 0434 05/20/19 0637 05/21/19 0406 05/22/19 0225 05/22/19 1430 05/23/19 0259  WBC  --    < >  --    < >  --   --    < >  --  23.0* 22.8* 13.4* 10.3  --  17.6*  CREATININE  --    < >  --    < >  --  3.05*   < >  --  3.49* 3.90* 3.87* 3.55* 3.85* 4.67*  LATICACIDVEN 2.3*  --  2.1*  --  2.1* 1.4  --  1.6  --   --   --   --   --   --   VANCORANDOM  --   --   --   --   --   --   --   --  17  --   --   --   --   --    < > = values in this interval not displayed.    Estimated Creatinine Clearance: 14 mL/min (A) (by C-G formula based on SCr of 4.67 mg/dL (H)).    No Known Allergies  Antimicrobials this admission: Cefepime 10/22>>10/25; 10/29>>  Vancomycin 10/22>>10/25; 10/28>>  Microbiology results: 10/29 BCx - collected 10/28 BAL - reincubated 10/24 TA - normal flora 10/22 BCx: ngF 10/22 UCx: ng 10/22 Respiratory PCR: negative 10/22 COVID: negative  Thank you for allowing pharmacy to be a part of this patient's care.  Vertis Kelch, PharmD PGY2 Cardiology Pharmacy Resident Phone [phone] 05/23/2019       1:26 PM  Please check [email or web address] for unit-specific pharmacist phone numbers

## 2019-05-23 NOTE — Progress Notes (Signed)
NAME:  Raymond Chavez, MRN:  [id number], DOB:  23-Aug-1950, LOS: 7 ADMISSION DATE:  05/14/2019, CONSULTATION DATE:  05/02/2019 REFERRING MD:  Billy Fischer  CHIEF COMPLAINT:  SOB   Brief History   Raymond Chavez is a 68 y.o. male who was admitted with acute hypoxic respiratory failure due to acute pulmonary edema in setting of NSTEMI, cardiogenic shock, possible PNA, He required intubation in the ED after failing BiPAP. On impella for hemodynamic support CVTS and cardiology are discussing about CABG, LVAD  Past Medical History  HTN, CAD.  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  10/27-heparin stopped due to low platelets.  HIT panel sent.  Starting bivalirudin.  Off Levophed.  On minimal vent support but failed weaning trials  Consults:  Cardiology PCCM Nephrology   Procedures:  ETT 10/22 >  [place] 10/22 > Impella 10/23 >> 10/24 Changed to 5.0 via right axillary >>   Significant Diagnostic Tests:  CTA chest 10/22 > no PE, b/l basilar consolidation, diffuse interlobular septal thickening with GGO's, b/l hilar adenopathy. Echo 10/23 > severely decreased LV function.  Cannot estimate EF  Micro Data:  Blood 10/22, 10/24>>> Sputum 10/22 >>>  Urine 10/22 >>> Urine Strep > Negative  RVP 10/22 > Neg MRSA PCR > Neg SARS CoV2 10/22 > neg.  Antimicrobials:  Vanc 10/22 > 10/25 Cefepime 10/22 >    Interim history/subjective:  Febrile with Tmax 102 and increased FIO2 requirement to 70% this morning. Peak pressures in mid-teens with intermittent increases to >30. Yesterday sedation and pressor requirement increased. HF increased lasix with UOP 1.8L in last 24 hours. CVP remains elevated >12  Objective:  Blood pressure 99/78, pulse (!) 105, temperature (!) 101.1 F (38.4 C), resp. rate 18, height _0  (1.702 m),  weight 75 kg, SpO2 95 %. PAP: (41-79)/(16-44) 59/41 CVP:  [6 mmHg-17 mmHg] 14 mmHg CO:  [3.7 L/min-5.7 L/min] 3.7 L/min CI:  [2 L/min/m2-3.1 L/min/m2] 2 L/min/m2  Vent Mode: PRVC FiO2 (%):  [40 %-70 %] 70 % Set Rate:  [18 bmp] 18 bmp Vt Set:  [450 mL] 450 mL PEEP:  [5 cmH20] 5 cmH20 Plateau Pressure:  [11 cmH20-22 cmH20] 17 cmH20   Intake/Output Summary (Last 24 hours) at 05/23/2019 0746 Last data filed at 05/23/2019 0700 Gross per 24 hour  Intake 4123.89 ml  Output 1880 ml  Net 2243.89 ml   Filed Weights   05/21/19 0500 05/22/19 0500 05/23/19 0403  Weight: 74.6 kg 74.5 kg 75 kg   Physical Exam: General: Critically ill-appearing, no acute distress HENT: [place], AT, ETT in place Eyes: EOMI, no scleral icterus Respiratory: Diminished breath sounds bilaterally. No wheezing Cardiovascular: Continuous hum GI: BS+, soft, nontender Extremities:-Edema,-tenderness Neuro: Sedated  Assessment & Plan:   Acute hypoxic respiratory failure requiring intubation - multifactorial due to LLL PNA, acute pulmonary edema in setting of cardiogenic shock  Continues to have intermittently high peak pressures possibly secondary to bronchospasm S/p bronchoscopy Plan  Scheduled brovana, pulmicort, atrovent Continue steroids D2 Wean PEEP/FIO2 VAP Continue diuresis. Plan for CRRT for volume removal once vas cath placed  Cardiogenic Shock in setting of severe 3V Disease with depressed LV Dysfunction  H/O MI 1998, CAD  EF 15-20% with decreased RV systolic function  25/36 RHC/LHC which showed severe 3V CAD (LM 75%, LAD 100%, LCX  80%, RCA 80% prox) and severely depressed LV function Plan Cardiology/CT Surgery Following >> ??LVAD vs CABG  Continue milrinone, amiodarone Continue Impella Continue levophed for MAP goal >65 Continue preadmission ASA Hold preadmission amlodipine, atorvastatin, HCTZ, lisinopril Primary team requests Vas Cath placement for CRRT   Co-comitant Septic Shock Continue  pressor support as above  Continue Vanc and Cefepime. Plan for 5-7 days. De-escalate pending clinical response and culture data Repeat blood cultures Follow-up final BAL and blood cultures  Acute Kidney Injury  Plan Nephrology following Plan for CRRT for volume removal once vas cath placed  Thrombocytopenia HIT panel negative Plan Transition from bivaluridin to heparin Transfuse 1U platelets prior to line placement  Best Practice:  Diet: Tube feeds Pain/Anxiety/Delirium protocol: Fentanyl gtt / Versed gtt and PRN.  RASS goal 0 to -1. VAP protocol: Yes.  DVT prophylaxis: SCD's /bivalirudin GI prophylaxis: PPI. Glucose control: SSI. Mobility: Bedrest. Code Status: Full. Family Communication: Updated per primary team Disposition: ICU.  Labs   CBC: Recent Labs  Lab 04/25/2019 1834  05/05/2019 0900  04/27/2019 1504  05/04/2019 2127  05/19/19 0434 05/20/19 5956  05/21/19 0406  05/22/19 0225 05/22/19 0439 05/22/19 0815 05/23/19 0259 05/23/19 0307  WBC 19.6*   < > 20.8*  --  15.9*   < > 22.9*  --  23.0* 22.8*  --  13.4*  --  10.3  --   --  17.6*  --   NEUTROABS 17.1*  --  17.8*  --  13.3*  --  17.4*  --   --   --   --   --   --   --   --   --   --   --   HGB 17.2*   < > 16.3   < > 14.7   < > 11.7*   < > 9.5* 7.9*   < > 7.4*   < > 9.0* 7.5* 8.5* 8.8* 8.8*  HCT 53.3*   < > 51.1   < > 45.8   < > 33.3*   < > 27.8* 23.4*   < > 22.4*   < > 28.3* 22.0* 25.0* 26.6* 26.0*  MCV 94.3   < > 93.1  --  94.2   < > 87.4  --  88.8 90.7  --  91.1  --  89.8  --   --  88.4  --   PLT 213   < > 204  --  149*   < > 120*  --  89* 66*  --  36*  --  30*  --   --  48*  --    < > = values in this interval not displayed.   Basic Metabolic Panel: Recent Labs  Lab 05/19/2019 1610  05/19/19 0434 05/20/19 3875  05/21/19 0406  05/22/19 0225 05/22/19 0439 05/22/19 0815 05/22/19 1430 05/23/19 0259 05/23/19 0307  NA  --    < > 138 137   < > 136   < > 139 141 141 139 135 135  K  --    < > 3.6 3.3*   < >  3.3*   < > 3.5 3.2* 3.4* 4.2 4.5 4.5  CL  --    < > 102 104  --  106  --  106  --   --  105 102  --   CO2  --    < > 25 22  --  20*  --  22  --   --  22 20*  --   GLUCOSE  --    < > 168* 176*  --  133*  --  145*  --   --  126* 175*  --   BUN  --    < > 51* 56*  --  58*  --  58*  --   --  61* 72*  --   CREATININE  --    < > 3.49* 3.90*  --  3.87*  --  3.55*  --   --  3.85* 4.67*  --   CALCIUM  --    < > 7.8* 7.5*  --  7.9*  --  8.0*  --   --  8.2* 8.2*  --   MG 2.3  --  2.0 2.0  --  2.1  --   --   --   --   --  2.0  --   PHOS 3.8  --  3.7 3.4  --  2.5  --   --   --   --   --  4.6  --    < > = values in this interval not displayed.   GFR: Estimated Creatinine Clearance: 14 mL/min (A) (by C-G formula based on SCr of 4.67 mg/dL (H)). Recent Labs  Lab 05/25/2019 0900  04/27/2019 1752 05/01/2019 0350 05/03/2019 0448 05/07/2019 1200  04/28/2019 2327 05/19/19 0434 05/20/19 0637 05/21/19 0406 05/21/19 1045 05/22/19 0225 05/23/19 0259  PROCALCITON 5.55  --   --  6.85  --   --   --   --  3.64  --   --  0.98  --   --   WBC 20.8*   < >  --  19.0*  --   --    < >  --  23.0* 22.8* 13.4*  --  10.3 17.6*  LATICACIDVEN  --    < > 2.1*  --  2.1* 1.4  --  1.6  --   --   --   --   --   --    < > = values in this interval not displayed.   Liver Function Tests: Recent Labs  Lab 05/03/2019 1200 05/09/2019 2127 05/19/19 0434 05/20/19 0637 05/21/19 0406  AST 130* 118* 64* 27 22  ALT _0 ALKPHOS 63 61 53 48 56  BILITOT 2.7* 1.8* 1.1 1.0 1.1  PROT 5.7* 5.3* 4.8* 4.8* 4.7*  ALBUMIN 2.7* 2.5* 2.2* 2.2* 2.2*   No results for input(s): LIPASE, AMYLASE in the last 168 hours. No results for input(s): AMMONIA in the last 168 hours. ABG    Component Value Date/Time   PHART 7.265 (L) 05/23/2019 0335   PCO2ART 46.6 05/23/2019 0335   PO2ART 40.3 (L) 05/23/2019 0335   HCO3 20.5 05/23/2019 0335   TCO2 23 05/23/2019 0307   ACIDBASEDEF 5.3 (H) 05/23/2019 0335   O2SAT 68.3 05/23/2019 0335   O2SAT 60.7  05/23/2019 0335    Coagulation Profile: Recent Labs  Lab 04/27/2019 2127  INR 1.5*   Cardiac Enzymes: No results for input(s): CKTOTAL, CKMB, CKMBINDEX, TROPONINI in the last 168 hours. HbA1C: Hgb A1c MFr Bld  Date/Time Value Ref Range Status  04/25/2019 11:25 PM 5.5 4.8 - 5.6 % Final    Comment:    (NOTE) Pre diabetes:          5.7%-6.4% Diabetes:              >6.4% Glycemic control for   <7.0% adults  with diabetes    CBG: Recent Labs  Lab 05/22/19 1101 05/22/19 1625 05/22/19 1952 05/23/19 0026 05/23/19 0305  GLUCAP 108* 157* 134* 159* 161*    Critical care time: 40 min.   The patient is critically ill with multiple organ systems failure and requires high complexity decision making for assessment and support, frequent evaluation and titration of therapies, application of advanced monitoring technologies and extensive interpretation of multiple databases.   Critical Care Time devoted to patient care services described in this note is 45 Minutes. This time reflects time of care of this signee Dr. Rodman Pickle.   Rodman Pickle, M.D. [facility] Pulmonary/Critical Care Medicine 05/23/2019 7:46 AM  Pager: [phone] After hours pager: [phone]

## 2019-05-23 NOTE — Progress Notes (Addendum)
Patient ID: Raymond Chavez, male   DOB: 1951-01-08, 68 y.o.   MRN: [id number]    Advanced Heart Failure Rounding Note   Subjective:    Underwent placement of Impella 5.0 on 10/24 with removal of R femoral Impella CP.   Remains intubated. Increased oxygen demand. CXR with possible left basilar PNA.  Tm 101.3 overnight, patient is on cefepime and vancomycin.   Norepinephrine 10 mcg + milrinone 0.25.  UOP not vigorous on Lasix gtt at 12 and creatinine up to 4.67.  LDH higher at 771 this morning as well.   Plts higher at 48 K this morning, HIT negative.   Swan #s  PA 66/42  CVP 18  CO 3.9  CI 2.2 Co-ox 68%  Impella P-8 No flows due to damage of sensor during placement. Motor current stable.   RHC/LHC (10/23):  Coronary Findings  Diagnostic Dominance: Right Left Main  75% distal left main stenosis.  Left Anterior Descending  Occluded proximally, some late filling by collaterals.  Ramus Intermedius  Large vessel, patent with luminal irregularities.  Left Circumflex  80% mid LCx stenosis. Small OM1 with 95% proximal stenosis. Large OM2 is subtotally occluded (fills late with TIMI 2 flow).  Right Coronary Artery  80% proximal stenosis. Serial 50% mid-vessel stenoses. PDA with only luminal irregularities. Large PLV with 80% mid-vessel stenosis.  Intervention  No interventions have been documented. Right Heart  Right Heart Pressures RHC Procedural Findings (mmHg): Hemodynamics RA mean 13 RV 70/17 PA 73/41, mean 53 PCWP mean 30 LV 140/40 AO 138/88  Oxygen saturations: PA 60% AO 97%  Cardiac Output (Fick) 2.95  Cardiac Index (Fick) 1.62 PVR 7.8 WU  Cardiac Output (Thermo) 3.24 Cardiac Index (Thermo) 1.78  PVR 7.1 WU  PAPI 2.46    Objective:   Weight Range:  Vital Signs:   Temp:  [97.9 F (36.6 C)-101.3 F (38.5 C)] 101.1 F (38.4 C) (10/29 0700) Pulse Rate:  [81-105] 105 (10/29 0325) Resp:  [15-32] 18 (10/29 0700) BP: (68-121)/(56-91) 99/78 (10/29  0700) SpO2:  [88 %-100 %] 92 % (10/29 0700) Arterial Line BP: (79-135)/(48-79) 100/64 (10/29 0700) FiO2 (%):  [40 %-60 %] 60 % (10/29 0331) Weight:  [75 kg] 75 kg (10/29 0403) Last BM Date: (UTA)  Weight change: Filed Weights   05/21/19 0500 05/22/19 0500 05/23/19 0403  Weight: 74.6 kg 74.5 kg 75 kg    Intake/Output:   Intake/Output Summary (Last 24 hours) at 05/23/2019 0726 Last data filed at 05/23/2019 0700 Gross per 24 hour  Intake 4123.89 ml  Output 1880 ml  Net 2243.89 ml     Physical Exam: CVp18  General:  Tachypnea on vent HEENT: ETT Neck: supple. JVP to jaw. Carotids 2+ bilat; no bruits. No lymphadenopathy or thryomegaly appreciated. Cor: PMI nondisplaced. Regular rate & rhythm. No rubs, gallops or murmurs.R axillary Impella Lungs: clear Abdomen: soft, nontender, nondistended. No hepatosplenomegaly. No bruits or masses. Good bowel sounds. Extremities: no cyanosis, clubbing, rash, edema Neuro: Intubated on vent  .   Telemetry: NSR 90s  Labs: Basic Metabolic Panel: Recent Labs  Lab 05/02/2019 1610  05/19/19 0434 05/20/19 3299  05/21/19 0406  05/22/19 0225 05/22/19 0439 05/22/19 0815 05/22/19 1430 05/23/19 0259 05/23/19 0307  NA  --    < > 138 137   < > 136   < > 139 141 141 139 135 135  K  --    < > 3.6 3.3*   < > 3.3*   < > 3.5 3.2* 3.4*  4.2 4.5 4.5  CL  --    < > 102 104  --  106  --  106  --   --  105 102  --   CO2  --    < > 25 22  --  20*  --  22  --   --  22 20*  --   GLUCOSE  --    < > 168* 176*  --  133*  --  145*  --   --  126* 175*  --   BUN  --    < > 51* 56*  --  58*  --  58*  --   --  61* 72*  --   CREATININE  --    < > 3.49* 3.90*  --  3.87*  --  3.55*  --   --  3.85* 4.67*  --   CALCIUM  --    < > 7.8* 7.5*  --  7.9*  --  8.0*  --   --  8.2* 8.2*  --   MG 2.3  --  2.0 2.0  --  2.1  --   --   --   --   --  2.0  --   PHOS 3.8  --  3.7 3.4  --  2.5  --   --   --   --   --  4.6  --    < > = values in this interval not displayed.    Liver  Function Tests: Recent Labs  Lab 05/22/2019 1200 05/24/2019 2127 05/19/19 0434 05/20/19 0637 05/21/19 0406  AST 130* 118* 64* 27 22  ALT '30 24 22 16 12  ' ALKPHOS 63 61 53 48 56  BILITOT 2.7* 1.8* 1.1 1.0 1.1  PROT 5.7* 5.3* 4.8* 4.8* 4.7*  ALBUMIN 2.7* 2.5* 2.2* 2.2* 2.2*   No results for input(s): LIPASE, AMYLASE in the last 168 hours. No results for input(s): AMMONIA in the last 168 hours.  CBC: Recent Labs  Lab 04/30/2019 1834  05/05/2019 0900  04/28/2019 1504  05/01/2019 2127  05/19/19 0434 05/20/19 8478  05/21/19 0406  05/22/19 0225 05/22/19 0439 05/22/19 0815 05/23/19 0259 05/23/19 0307  WBC 19.6*   < > 20.8*  --  15.9*   < > 22.9*  --  23.0* 22.8*  --  13.4*  --  10.3  --   --  17.6*  --   NEUTROABS 17.1*  --  17.8*  --  13.3*  --  17.4*  --   --   --   --   --   --   --   --   --   --   --   HGB 17.2*   < > 16.3   < > 14.7   < > 11.7*   < > 9.5* 7.9*   < > 7.4*   < > 9.0* 7.5* 8.5* 8.8* 8.8*  HCT 53.3*   < > 51.1   < > 45.8   < > 33.3*   < > 27.8* 23.4*   < > 22.4*   < > 28.3* 22.0* 25.0* 26.6* 26.0*  MCV 94.3   < > 93.1  --  94.2   < > 87.4  --  88.8 90.7  --  91.1  --  89.8  --   --  88.4  --   PLT 213   < > 204  --  149*   < > 120*  --  89* 66*  --  36*  --  30*  --   --  48*  --    < > = values in this interval not displayed.    Cardiac Enzymes: No results for input(s): CKTOTAL, CKMB, CKMBINDEX, TROPONINI in the last 168 hours.  BNP: BNP (last 3 results) Recent Labs    05/15/2019 1739 05/24/2019 1234  BNP 2,725.3* 3,370.0*    ProBNP (last 3 results) No results for input(s): PROBNP in the last 8760 hours.    Other results:  Imaging: Dg Chest Port 1 View  Result Date: 05/22/2019 CLINICAL DATA:  Status post bronchoscopy. EXAM: PORTABLE CHEST 1 VIEW COMPARISON:  Same day. FINDINGS: Stable cardiomediastinal silhouette. No pneumothorax or pleural effusion is noted. Endotracheal and nasogastric tubes are unchanged in position. Left ventricular assist device is  unchanged. Right internal jugular Swan-Ganz catheter is unchanged. No acute pulmonary disease is noted. Bony thorax is unremarkable. IMPRESSION: Stable support apparatus. No acute cardiopulmonary abnormality seen. Electronically Signed   By: Marijo Conception M.D.   On: 05/22/2019 11:36   Dg Chest Port 1 View  Result Date: 05/22/2019 CLINICAL DATA:  Respiratory distress EXAM: PORTABLE CHEST 1 VIEW COMPARISON:  05/21/2019 FINDINGS: Cardiac shadow is stable. Endotracheal tube, gastric catheter and Swan-Ganz catheter are noted in satisfactory position. Right-sided PICC line and Impella catheter are seen and stable. The lungs are well aerated bilaterally. No focal infiltrate or sizable effusion is seen. No bony abnormality is seen. IMPRESSION: Tubes and lines as described above stable from the previous exam. No acute abnormality noted. Electronically Signed   By: Inez Catalina M.D.   On: 05/22/2019 01:33   Vas [place] Upper Extremity Venous Duplex  Result Date: 05/22/2019 UPPER VENOUS STUDY  Indications: Edema Other Indications: Impella, PICC line, IV, all in right chest/arm. Limitations: Line and bandages. Comparison Study: No prior study on file for comparison. Performing Technologist: Sharion Dove RVS  Examination Guidelines: A complete evaluation includes B-mode imaging, spectral Doppler, color Doppler, and power Doppler as needed of all accessible portions of each vessel. Bilateral testing is considered an integral part of a complete examination. Limited examinations for reoccurring indications may be performed as noted.  Right Findings: +----------+------------+---------+-----------+----------+---------------------+  RIGHT      Compressible Phasicity Spontaneous Properties        Summary         +----------+------------+---------+-----------+----------+---------------------+  IJV                                                         Not visualized       +----------+------------+---------+-----------+----------+---------------------+  Subclavian                 Yes        Yes                 patent by color and                                                                    Doppler         +----------+------------+---------+-----------+----------+---------------------+  Axillary  Not visualized      +----------+------------+---------+-----------+----------+---------------------+  Brachial       Full        Yes        Yes                                       +----------+------------+---------+-----------+----------+---------------------+  Radial         Full                                                             +----------+------------+---------+-----------+----------+---------------------+  Ulnar                                                       patent by color     +----------+------------+---------+-----------+----------+---------------------+  Cephalic       Full                                                             +----------+------------+---------+-----------+----------+---------------------+  Basilic        Full                                                             +----------+------------+---------+-----------+----------+---------------------+  Left Findings: +----------+------------+---------+-----------+----------+-------+  LEFT       Compressible Phasicity Spontaneous Properties Summary  +----------+------------+---------+-----------+----------+-------+  Subclavian                 Yes        Yes                         +----------+------------+---------+-----------+----------+-------+  Summary:  Right: No evidence of deep vein thrombosis in the upper extremity. However, unable to visualize the IJV, axillary. No evidence of superficial vein thrombosis in the upper extremity. However, unable to visualize the IJV, axillary. No evidence of thrombosis in the . However, unable to  visualize the IJV, axillary.  Left: No evidence of thrombosis in the subclavian.  *See table(s) above for measurements and observations.  Diagnosing physician: Deitra Mayo MD Electronically signed by Deitra Mayo MD on 05/22/2019 at 6:24:27 AM.    Final      Medications:     Scheduled Medications:  arformoterol  15 mcg Nebulization BID   aspirin  81 mg Per Tube Daily   atorvastatin  80 mg Per Tube q1800   budesonide (PULMICORT) nebulizer solution  0.5 mg Nebulization BID   chlorhexidine gluconate (MEDLINE KIT)  15 mL Mouth Rinse BID   Chlorhexidine Gluconate Cloth  6 each Topical Daily   insulin aspart  1-3 Units Subcutaneous Q4H   ipratropium  0.5 mg Nebulization Q6H  lidocaine-EPINEPHrine  20 mL Infiltration Once   mouth rinse  15 mL Mouth Rinse 10 times per day   methylPREDNISolone (SOLU-MEDROL) injection  40 mg Intravenous Q12H   pantoprazole (PROTONIX) IV  40 mg Intravenous QHS   potassium chloride  20 mEq Oral Once   sodium chloride flush  10-40 mL Intracatheter Q12H   sodium chloride flush  3 mL Intravenous Q12H   sodium chloride flush  3 mL Intravenous Q12H   vancomycin variable dose per unstable renal function (pharmacist dosing)   Does not apply See admin instructions    Infusions:  sodium chloride 10 mL/hr at 05/20/19 0910   sodium chloride Stopped (05/22/19 1301)   sodium chloride 10 mL/hr at 05/23/19 0700   sodium chloride     amiodarone 30 mg/hr (05/23/19 0700)   bivalirudin (ANGIOMAX) infusion 0.5 mg/mL (Non-ACS indications) 0.05 mg/kg/hr (05/23/19 0700)   ceFEPime (MAXIPIME) IV     dexmedetomidine (PRECEDEX) IV infusion Stopped (05/22/19 0359)   dextrose 5 % Impella 5.0 Purge solution     feeding supplement (VITAL AF 1.2 CAL) 1,000 mL (05/22/19 1400)   fentaNYL infusion INTRAVENOUS 375 mcg/hr (05/23/19 0700)   furosemide (LASIX) infusion 12 mg/hr (05/23/19 0700)   midazolam 3 mg/hr (05/23/19 0700)   milrinone 0.25  mcg/kg/min (05/23/19 0700)   norepinephrine (LEVOPHED) Adult infusion 10 mcg/min (05/23/19 0700)    PRN Medications: sodium chloride, Place/Maintain arterial line **AND** sodium chloride, sodium chloride, docusate, fentaNYL, fentaNYL (SUBLIMAZE) injection, fentaNYL (SUBLIMAZE) injection, influenza vaccine adjuvanted, ipratropium-albuterol, midazolam, midazolam, pneumococcal 23 valent vaccine, sodium chloride flush, sodium chloride flush   Assessment/Plan:   1. Shock: Now primarily cardiogenic (initially concern for septic component).  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  Echo with EF 20%, RV moderately HK.  Impella CP placed 10/23. Switched for Impella 5.0 on 10/24. No flow meter on Impella due to damage to sensor on insertion. Patient has LBBB.  CVP 18. Minimal urine output  - Continue current milrinone 0.25. Norepi 10 mcg.  - Continue Impella at P8, appears stable this morning. LDH trending up  538.>771  - ECHO now.   2. CAD:  NSTEMI, hs-TnI 16,000. 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  - continue ASA/statin. No b-blocker with shock - CABG with Impella 5.0 support likely best option when able 3. AKI: Baseline creatinine 1.3., now AKI likely due to ATN from shock and contrast (had CTA for PE at admission, only 25 cc contrast with cath). Creatinine higher 4.67. Minimal urine output.  Dr Aundra Dubin discussed with Dr Justin Mend. Plan to start CVVHD. Ask CCM to place HD catheter.  4. Acute Respiratory Failure: Suspect primarily pulmonary edema at this point.  Initial concern for PNA but CXR clear and off abx. FiO2 increased. Tachypneic this morning.  -  Appreciate CCM help with vent.   5. ID: Suspected PNA.  PCT 6.85-> 3.64.  WBCs now normal.  He was on vancomycin/cefepime, now stopped.  Afebrile. Cultures NGTD.  6. LBBB: Unsure chronicity.  7. Anemia: Hgb 8.8. LDH trending up.    8. Thrombocytopenia: Platelets at 48 K today.  ?HIT,  ?hemolysis, ?sepsis.  HIT negative.  9. PVCs/NSVT: Amiodarone gtt.  10. FEN: Tube feeds.    Length of Stay: 7   Amy Clegg NP-C  05/23/2019, 7:26 AM  Advanced Heart Failure Team Pager [phone] (M-F; 7a - 4p)  Please contact Home [facility] for night-coverage after  hours (4p -7a ) and weekends on [email or web address]  Patient seen with NP, agree with the above note.    CXR with left base PNA, worsening oxygenation and rising CVP with poor UOP and rising creatinine.  Febrile overnight.  Cardiac index and co-ox stable but now on norepinephrine.   General: Intubated/sedated.  Neck: JVP 14+, no thyromegaly or thyroid nodule.  Lungs: Wheezes CV: Nondisplaced PMI.  Heart regular S1/S2, no S3/S4, no murmur.  1+ ankle edema.  Dopplerable pedal pulses.  Abdomen: Soft, nontender, no hepatosplenomegaly, no distention.  Skin: Intact without lesions or rashes.  Neurologic: Sedated  Extremities: No clubbing or cyanosis.  HEENT: Normal.   1. Shock: Possible mixed cardiogenic/septic with fever and suspected PNA.  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  Echo with EF 20%, RV moderately HK.  Impella CP placed 10/23. Switched for Impella 5.0 on 10/24. No flow meter on Impella due to damage to sensor on insertion. Patient has LBBB.  On milrinone 0.25 and norepinephrine 10, norepinephrine restarted overnight with hypotension.  CI adequate at 2.2, co-ox 68%. CVP 18. UOP not adequate on Lasix gtt 12 mg/hr yesterday and creatinine rising, 4.67 today.   - Continue current milrinone 0.25 and norepinephrine.  - Discussed with renal, with rising CVP and worsening oxygen, I think that he needs CVVH.  Will hold bivalirudin to place line this morning.  - Continue Impella at P8, motor current stable this morning (sensor nonfunctional). LDH higher today at 771. Will assess position under echo again today.  2. CAD:  NSTEMI, hs-TnI 16,000. 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  - continue ASA/statin. No b-blocker with shock - CABG with Impella 5.0 support likely best option when able 3. AKI: Baseline creatinine 1.3., now AKI likely due to ATN from shock and contrast (had CTA for PE at admission, only 25 cc contrast with cath). Renal function considerably worsened over the last day. Discussed with nephrology, plan to start CVVH today.   4. Acute Respiratory Failure: Pulmonary edema + PNA left base.  -  Plan to start CVVH.  -  Appreciate CCM help with vent.   5. ID: Suspected PNA, CXR with left base density.  Febrile, WBCs up to 17.6.   - Pending culture data.  - Covering with vancomycin/cefepime.  - Send PCT.  6. LBBB: Unsure chronicity.  7. Anemia: Hgb stable at 8.8.  LDH higher but urine has been clear.   - Assess position of Impella under echo.  8. Thrombocytopenia: Platelets up to 48K today, HIT negative.  ?Low due to sepsis versus hemolysis.  - Can stop bivalirudin and restart heparin.   9. PVCs/NSVT: Amiodarone gtt. Quiescent.  10. FEN: Tube feeds.   CRITICAL CARE Performed by: Loralie Champagne  Total critical care time: 45 minutes  Critical care time was exclusive of separately billable procedures and treating other patients.  Critical care was necessary to treat or prevent imminent or life-threatening deterioration.  Critical care was time spent personally by me on the following activities: development of treatment plan with patient and/or surrogate as well as nursing, discussions with consultants, evaluation of patient's response to treatment, examination of patient, obtaining history from patient or surrogate, ordering and performing treatments and interventions, ordering and review of laboratory studies, ordering and review of radiographic studies, pulse oximetry and re-evaluation of patient's condition.  Loralie Champagne 05/23/2019 9:08 AM

## 2019-05-23 NOTE — Progress Notes (Signed)
  Echocardiogram 2D Echocardiogram limited for impella position has been performed.  Raymond Chavez M 05/23/2019, 10:05 AM

## 2019-05-23 NOTE — Progress Notes (Signed)
5 Days Post-Op Procedure(s) (LRB): INSERTION OF IMPELLA 5.0 IMPLANTABLE LEFT VENTRICULAR ASSIST DEVICE THROUGH RIGHT AXILLARY; REMOVAL OF RIGHT GROIN IMPELLA CP (N/A) right femoral artery repair and left heart catheterization Subjective: Intubated/sedate Objective: Vital signs in last 24 hours: Temp:  [97.9 F (36.6 C)-101.3 F (38.5 C)] 99.5 F (37.5 C) (10/29 0930) Pulse Rate:  [81-105] 105 (10/29 0325) Cardiac Rhythm: Normal sinus rhythm (10/29 0800) Resp:  [15-29] 19 (10/29 0930) BP: (68-121)/(56-91) 99/81 (10/29 0800) SpO2:  [88 %-100 %] 100 % (10/29 0930) Arterial Line BP: (79-135)/(48-75) 104/66 (10/29 0930) FiO2 (%):  [40 %-70 %] 70 % (10/29 0733) Weight:  [75 kg] 75 kg (10/29 0403)  Hemodynamic parameters for last 24 hours: PAP: (42-79)/(16-44) 56/36 CVP:  [6 mmHg-17 mmHg] 13 mmHg PCWP:  [28 mmHg] 28 mmHg CO:  [3.7 L/min-5.7 L/min] 4.7 L/min CI:  [2 L/min/m2-3.1 L/min/m2] 2.6 L/min/m2  Intake/Output from previous day: 10/28 0701 - 10/29 0700 In: 4123.9 [I.V.:1999.1; NG/GT:1410; IV Piggyback:353.6] Out: 1880 [Urine:1880] Intake/Output this shift: Total I/O In: 275.2 [I.V.:198.2; Other:17; NG/GT:60] Out: 0   General appearance: sedated/intubated Neurologic: unable to assess Heart: regular rate and rhythm Lungs: diminished breath sounds bilaterally Abdomen: soft, non-tender; bowel sounds normal; no masses,  no organomegaly Extremities: warm Wound: small right axillary hematoma--stable  Lab Results: Recent Labs    05/22/19 0225  05/23/19 0259 05/23/19 0307  WBC 10.3  --  17.6*  --   HGB 9.0*   < > 8.8* 8.8*  HCT 28.3*   < > 26.6* 26.0*  PLT 30*  --  48*  --    < > = values in this interval not displayed.   BMET:  Recent Labs    05/22/19 1430 05/23/19 0259 05/23/19 0307  NA 139 135 135  K 4.2 4.5 4.5  CL 105 102  --   CO2 22 20*  --   GLUCOSE 126* 175*  --   BUN 61* 72*  --   CREATININE 3.85* 4.67*  --   CALCIUM 8.2* 8.2*  --     PT/INR: No  results for input(s): LABPROT, INR in the last 72 hours. ABG    Component Value Date/Time   PHART 7.265 (L) 05/23/2019 0335   HCO3 20.5 05/23/2019 0335   TCO2 23 05/23/2019 0307   ACIDBASEDEF 5.3 (H) 05/23/2019 0335   O2SAT 68.3 05/23/2019 0335   O2SAT 60.7 05/23/2019 0335   CBG (last 3)  Recent Labs    05/23/19 0026 05/23/19 0305 05/23/19 0818  GLUCAP 159* 161* 124*    Assessment/Plan: S/P Procedure(s) (LRB): INSERTION OF IMPELLA 5.0 IMPLANTABLE LEFT VENTRICULAR ASSIST DEVICE THROUGH RIGHT AXILLARY; REMOVAL OF RIGHT GROIN IMPELLA CP (N/A) right femoral artery repair and left heart catheterization acknowledge plan for HD; LDH increased--unclear as to etiology.   He is running out of options and is progressively sicker. Not likely a candidate for more advanced mechanical support at this point.    LOS: 7 days    Wonda Olds 05/23/2019

## 2019-05-24 ENCOUNTER — Inpatient Hospital Stay (HOSPITAL_COMMUNITY): Payer: PPO

## 2019-05-24 DIAGNOSIS — J9601 Acute respiratory failure with hypoxia: Secondary | ICD-10-CM | POA: Diagnosis not present

## 2019-05-24 DIAGNOSIS — Z95811 Presence of heart assist device: Secondary | ICD-10-CM

## 2019-05-24 DIAGNOSIS — R570 Cardiogenic shock: Secondary | ICD-10-CM | POA: Diagnosis not present

## 2019-05-24 DIAGNOSIS — N170 Acute kidney failure with tubular necrosis: Secondary | ICD-10-CM

## 2019-05-24 LAB — RENAL FUNCTION PANEL
Albumin: 2.5 g/dL — ABNORMAL LOW (ref 3.5–5.0)
Albumin: 2.9 g/dL — ABNORMAL LOW (ref 3.5–5.0)
Anion gap: 12 (ref 5–15)
Anion gap: 11 (ref 5–15)
BUN: 47 mg/dL — ABNORMAL HIGH (ref 8–23)
BUN: 38 mg/dL — ABNORMAL HIGH (ref 8–23)
CO2: 22 mmol/L (ref 22–32)
CO2: 24 mmol/L (ref 22–32)
Calcium: 7.6 mg/dL — ABNORMAL LOW (ref 8.9–10.3)
Calcium: 8.1 mg/dL — ABNORMAL LOW (ref 8.9–10.3)
Chloride: 100 mmol/L (ref 98–111)
Chloride: 100 mmol/L (ref 98–111)
Creatinine, Ser: 2.72 mg/dL — ABNORMAL HIGH (ref 0.61–1.24)
Creatinine, Ser: 2.09 mg/dL — ABNORMAL HIGH (ref 0.61–1.24)
GFR calc Af Amer: 26 mL/min — ABNORMAL LOW (ref >60)
GFR calc Af Amer: 36 mL/min — ABNORMAL LOW (ref >60)
GFR calc non Af Amer: 23 mL/min — ABNORMAL LOW (ref >60)
GFR calc non Af Amer: 31 mL/min — ABNORMAL LOW (ref >60)
Glucose, Bld: 179 mg/dL — ABNORMAL HIGH (ref 70–99)
Glucose, Bld: 160 mg/dL — ABNORMAL HIGH (ref 70–99)
Phosphorus: 3.1 mg/dL (ref 2.5–4.6)
Phosphorus: 3.9 mg/dL (ref 2.5–4.6)
Potassium: 4.3 mmol/L (ref 3.5–5.1)
Potassium: 4.8 mmol/L (ref 3.5–5.1)
Sodium: 134 mmol/L — ABNORMAL LOW (ref 135–145)
Sodium: 135 mmol/L (ref 135–145)

## 2019-05-24 LAB — HAPTOGLOBIN: Haptoglobin: <10 mg/dL — ABNORMAL LOW (ref 32–363)

## 2019-05-24 LAB — GLUCOSE, CAPILLARY
Glucose-Capillary: 163 mg/dL — ABNORMAL HIGH (ref 70–99)
Glucose-Capillary: 179 mg/dL — ABNORMAL HIGH (ref 70–99)
Glucose-Capillary: 147 mg/dL — ABNORMAL HIGH (ref 70–99)
Glucose-Capillary: 148 mg/dL — ABNORMAL HIGH (ref 70–99)
Glucose-Capillary: 185 mg/dL — ABNORMAL HIGH (ref 70–99)
Glucose-Capillary: 150 mg/dL — ABNORMAL HIGH (ref 70–99)

## 2019-05-24 LAB — POCT ACTIVATED CLOTTING TIME: Activated Clotting Time: 120 seconds

## 2019-05-24 LAB — CBC
HCT: 23.2 % — ABNORMAL LOW (ref 39.0–52.0)
Hemoglobin: 7.6 g/dL — ABNORMAL LOW (ref 13.0–17.0)
MCH: 28.9 pg (ref 26.0–34.0)
MCHC: 32.8 g/dL (ref 30.0–36.0)
MCV: 88.2 fL (ref 80.0–100.0)
Platelets: 103 10*3/uL — ABNORMAL LOW (ref 150–400)
RBC: 2.63 MIL/uL — ABNORMAL LOW (ref 4.22–5.81)
RDW: 16.7 % — ABNORMAL HIGH (ref 11.5–15.5)
WBC: 17.9 10*3/uL — ABNORMAL HIGH (ref 4.0–10.5)
nRBC: 0.4 % — ABNORMAL HIGH (ref 0.0–0.2)

## 2019-05-24 LAB — POCT I-STAT 7, (LYTES, BLD GAS, ICA,H+H)
Bicarbonate: 25.9 mmol/L (ref 20.0–28.0)
Calcium, Ion: 1.11 mmol/L — ABNORMAL LOW (ref 1.15–1.40)
HCT: 22 % — ABNORMAL LOW (ref 39.0–52.0)
Hemoglobin: 7.5 g/dL — ABNORMAL LOW (ref 13.0–17.0)
O2 Saturation: 99 %
Patient temperature: 36.5
Potassium: 4.5 mmol/L (ref 3.5–5.1)
Sodium: 135 mmol/L (ref 135–145)
TCO2: 27 mmol/L (ref 22–32)
pCO2 arterial: 44.5 mmHg (ref 32.0–48.0)
pH, Arterial: 7.371 (ref 7.350–7.450)
pO2, Arterial: 126 mmHg — ABNORMAL HIGH (ref 83.0–108.0)

## 2019-05-24 LAB — PREPARE PLATELET PHERESIS: Unit division: 0

## 2019-05-24 LAB — COOXEMETRY PANEL
Carboxyhemoglobin: 1.8 % — ABNORMAL HIGH (ref 0.5–1.5)
Methemoglobin: 1 % (ref 0.0–1.5)
O2 Saturation: 73.5 %
Total hemoglobin: 12 g/dL (ref 12.0–16.0)

## 2019-05-24 LAB — PROCALCITONIN: Procalcitonin: 0.4 ng/mL

## 2019-05-24 LAB — BPAM PLATELET PHERESIS
Blood Product Expiration Date: 202010292359
ISSUE DATE / TIME: 202010290909
Unit Type and Rh: 6200

## 2019-05-24 LAB — HEPARIN LEVEL (UNFRACTIONATED): Heparin Unfractionated: <0.1 IU/mL — ABNORMAL LOW (ref 0.30–0.70)

## 2019-05-24 LAB — LACTATE DEHYDROGENASE: LDH: 881 U/L — ABNORMAL HIGH (ref 98–192)

## 2019-05-24 LAB — ECHOCARDIOGRAM LIMITED
Height: 67 in
Weight: 2641.99 oz

## 2019-05-24 LAB — MAGNESIUM: Magnesium: 2.4 mg/dL (ref 1.7–2.4)

## 2019-05-24 LAB — PREPARE RBC (CROSSMATCH)

## 2019-05-24 MED ORDER — ROCURONIUM BROMIDE 50 MG/5ML IV SOLN
100.0000 mg | Freq: Once | INTRAVENOUS | Status: AC
  Administered 2019-05-24: 100 mg via INTRAVENOUS
  Filled 2019-05-24 (×2): qty 10

## 2019-05-24 MED ORDER — SODIUM CHLORIDE 0.9% IV SOLUTION: Freq: Once | INTRAVENOUS | Status: DC

## 2019-05-24 NOTE — Procedures (Signed)
Bedside Bronchoscopy Procedure Note Edsel Shives [id number] 12-Nov-1950  Procedure: Bronchoscopy Indications: Diagnostic evaluation of the airways, Obtain specimens for culture and/or other diagnostic studies and Remove secretions  Procedure Details: ET Tube Size: ET Tube secured at lip (cm): Bite block in place: Yes In preparation for procedure, Patient hyper-oxygenated with 100 % FiO2 Airway entered and the following bronchi were examined: RUL, RML, RLL, LUL, LLL and Bronchi.   Bronchoscope removed.  , Patient placed back on 100% FiO2 at conclusion of procedure.    Evaluation BP (!) 120/95   Pulse 94   Temp (!) 96.8 F (36 C)   Resp 13   Ht 5\' 7"  (1.702 m) Comment: measured x3  Wt 74.9 kg   SpO2 100%   BMI 25.86 kg/m  Breath Sounds:Diminished and Expiratory wheezes O2 sats: stable throughout Patient's Current Condition: stable Specimens:  None Complications: No apparent complications Patient did tolerate procedure well.   Torina Ey, Eddie North 05/24/2019, 3:04 PM

## 2019-05-24 NOTE — Procedures (Signed)
Bronchoscopy Procedure Note Raymond Chavez [id number] Apr 29, 1951  Procedure: Bronchoscopy Indications: Diagnostic evaluation of the airways  Procedure Details Consent: Risks of procedure as well as the alternatives and risks of each were explained to the (patient/caregiver).  Consent for procedure obtained. Time Out: Verified patient identification, verified procedure, site/side was marked, verified correct patient position, special equipment/implants available, medications/allergies/relevent history reviewed, required imaging and test results available.  Performed  In preparation for procedure, patient was given 100% FiO2 and bronchoscope lubricated. Sedation: Fentanyl 50 mcg and Versed 1 mg  Airway entered and the following bronchi were examined: RUL, RML, RLL, LUL and LLL and subsegments Dark sputum clot unclogged from ETT. Airways without evidence of erythema or inflammation. Thin frothy secretions present throughout airways Procedures performed: Diagnostic bronchoscopy  Evaluation Hemodynamic Status: BP stable throughout; O2 sats: stable throughout Patient's Current Condition: stable Specimens:  None Complications: No apparent complications Patient did tolerate procedure well.  Assessment Pulmonary edema Mucous plugging  Plan Low-tidal ventilation protocol Wean FIO2/PEEP Bronchodilators and pulmonary hygiene  Raymond Chavez Raymond Chavez 05/24/2019

## 2019-05-24 NOTE — Progress Notes (Signed)
Patient ID: Raymond Chavez, male   DOB: Oct 30, 1950, 68 y.o.   MRN: [id number]    Advanced Heart Failure Rounding Note   Subjective:    Underwent placement of Impella 5.0 on 10/24 with removal of R femoral Impella CP.   CVVH started on 10/29.  Currently UF 200 cc/hr.  UOP only 35 cc yesterday.   Now afebrile, WBCs 17.9 today, patient is on cefepime and vancomycin.   Norepinephrine 10 mcg + milrinone 0.25.  UOP not vigorous on Lasix gtt at 12 and creatinine up to 4.67.  LDH higher at 771 this morning as well.   Plts higher at 103 K this morning, HIT negative. He had 1 unit plts on 10/29 prior to CVVH line placement.   He is awake and responds to commands.   Swan #s  PA 66/39  CVP 12-13  CI 2.5 Co-ox 73.5%  Impella P-8 No flows due to damage of sensor during placement. Motor current stable. LDH higher at 881.   RHC/LHC (10/23):  Coronary Findings  Diagnostic Dominance: Right Left Main  75% distal left main stenosis.  Left Anterior Descending  Occluded proximally, some late filling by collaterals.  Ramus Intermedius  Large vessel, patent with luminal irregularities.  Left Circumflex  80% mid LCx stenosis. Small OM1 with 95% proximal stenosis. Large OM2 is subtotally occluded (fills late with TIMI 2 flow).  Right Coronary Artery  80% proximal stenosis. Serial 50% mid-vessel stenoses. PDA with only luminal irregularities. Large PLV with 80% mid-vessel stenosis.  Intervention  No interventions have been documented. Right Heart  Right Heart Pressures RHC Procedural Findings (mmHg): Hemodynamics RA mean 13 RV 70/17 PA 73/41, mean 53 PCWP mean 30 LV 140/40 AO 138/88  Oxygen saturations: PA 60% AO 97%  Cardiac Output (Fick) 2.95  Cardiac Index (Fick) 1.62 PVR 7.8 WU  Cardiac Output (Thermo) 3.24 Cardiac Index (Thermo) 1.78  PVR 7.1 WU  PAPI 2.46    Objective:   Weight Range:  Vital Signs:   Temp:  [94.8 F (34.9 C)-100.8 F (38.2 C)] 96.6 F (35.9 C)  (10/30 0700) Pulse Rate:  [79-85] 79 (10/30 0320) Resp:  [0-20] 15 (10/30 0700) BP: (90-118)/(60-87) 118/85 (10/30 0700) SpO2:  [95 %-100 %] 97 % (10/30 0700) Arterial Line BP: (86-146)/(54-92) 128/86 (10/30 0700) FiO2 (%):  [50 %-60 %] 50 % (10/30 0320) Weight:  [74.9 kg-78 kg] 74.9 kg (10/30 0500) Last BM Date: 05/20/19  Weight change: Filed Weights   05/23/19 0403 05/23/19 1245 05/24/19 0500  Weight: 75 kg (S) 78 kg 74.9 kg    Intake/Output:   Intake/Output Summary (Last 24 hours) at 05/24/2019 0735 Last data filed at 05/24/2019 0700 Gross per 24 hour  Intake 4273.11 ml  Output 4581 ml  Net -307.89 ml     Physical Exam: CVP 12-13  General: Intubated, opens eyes. Neck: JVP 12 cm, no thyromegaly or thyroid nodule.  Lungs: Decreased BS at bases.  CV: Nondisplaced PMI.  Heart regular S1/S2, no S3/S4, no murmur.  Impella sounds. 1+ ankle edema.   Abdomen: Soft, nontender, no hepatosplenomegaly, no distention.  Skin: Intact without lesions or rashes.  Neurologic: Will wake up and follow commands.  Extremities: No clubbing or cyanosis.  HEENT: Normal.   Telemetry: NSR 90s (personally reviewed)  Labs: Basic Metabolic Panel: Recent Labs  Lab 05/19/19 0434 05/20/19 6761  05/21/19 0406  05/22/19 0225  05/22/19 1430 05/23/19 0259 05/23/19 0307 05/23/19 1618 05/24/19 0420 05/24/19 0429  NA 138 137   < >  136   < > 139   < > 139 135 135 136 134* 135  K 3.6 3.3*   < > 3.3*   < > 3.5   < > 4.2 4.5 4.5 4.4 4.3 4.5  CL 102 104  --  106  --  106  --  105 102  --  101 100  --   CO2 25 22  --  20*  --  22  --  22 20*  --  22 22  --   GLUCOSE 168* 176*  --  133*  --  145*  --  126* 175*  --  184* 179*  --   BUN 51* 56*  --  58*  --  58*  --  61* 72*  --  70* 47*  --   CREATININE 3.49* 3.90*  --  3.87*  --  3.55*  --  3.85* 4.67*  --  4.15* 2.72*  --   CALCIUM 7.8* 7.5*  --  7.9*  --  8.0*  --  8.2* 8.2*  --  7.9* PENDING  --   MG 2.0 2.0  --  2.1  --   --   --   --  2.0  --    --  2.4  --   PHOS 3.7 3.4  --  2.5  --   --   --   --  4.6  --  4.7* 3.1  --    < > = values in this interval not displayed.    Liver Function Tests: Recent Labs  Lab 05/01/2019 1200 05/08/2019 2127 05/19/19 0434 05/20/19 0637 05/21/19 0406 05/23/19 1618 05/24/19 0420  AST 130* 118* 64* 27 22  --   --   ALT _0 --   --   ALKPHOS 63 61 53 48 56  --   --   BILITOT 2.7* 1.8* 1.1 1.0 1.1  --   --   PROT 5.7* 5.3* 4.8* 4.8* 4.7*  --   --   ALBUMIN 2.7* 2.5* 2.2* 2.2* 2.2* 2.5* 2.5*   No results for input(s): LIPASE, AMYLASE in the last 168 hours. No results for input(s): AMMONIA in the last 168 hours.  CBC: Recent Labs  Lab 05/01/2019 0900  05/03/2019 1504  05/08/2019 2127  05/20/19 2947  05/21/19 0406  05/22/19 0225  05/22/19 0815 05/23/19 0259 05/23/19 0307 05/24/19 0420 05/24/19 0429  WBC 20.8*  --  15.9*   < > 22.9*   < > 22.8*  --  13.4*  --  10.3  --   --  17.6*  --  17.9*  --   NEUTROABS 17.8*  --  13.3*  --  17.4*  --   --   --   --   --   --   --   --   --   --   --   --   HGB 16.3   < > 14.7   < > 11.7*   < > 7.9*   < > 7.4*   < > 9.0*   < > 8.5* 8.8* 8.8* 7.6* 7.5*  HCT 51.1   < > 45.8   < > 33.3*   < > 23.4*   < > 22.4*   < > 28.3*   < > 25.0* 26.6* 26.0* 23.2* 22.0*  MCV 93.1  --  94.2   < > 87.4   < > 90.7  --  91.1  --  89.8  --   --  88.4  --  88.2  --   PLT 204  --  149*   < > 120*   < > 66*  --  36*  --  30*  --   --  48*  --  103*  --    < > = values in this interval not displayed.    Cardiac Enzymes: No results for input(s): CKTOTAL, CKMB, CKMBINDEX, TROPONINI in the last 168 hours.  BNP: BNP (last 3 results) Recent Labs    05/15/2019 1739 05/24/2019 1234  BNP 2,725.3* 3,370.0*    ProBNP (last 3 results) No results for input(s): PROBNP in the last 8760 hours.    Other results:  Imaging: Dg Chest Port 1 View  Result Date: 05/23/2019 CLINICAL DATA:  Central line placement. EXAM: PORTABLE CHEST 1 VIEW COMPARISON:  Earlier film, same  date. FINDINGS: The endotracheal tube, Swan-Ganz catheter, right PICC line, NG tube and Impella device are stable since the earlier film. New left IJ central venous catheter tip is near the brachiocephalic SVC junction. No complicating features. Improved aeration since the earlier film may suggest improving edema and atelectasis. IMPRESSION: 1. Support apparatus in good position without complicating features. 2. New left IJ catheter tip is in the mid SVC. 3. Improved lung aeration since earlier film today. Electronically Signed   By: Marijo Sanes M.D.   On: 05/23/2019 12:05   Dg Chest Port 1 View  Result Date: 05/23/2019 CLINICAL DATA:  Respiratory failure. EXAM: PORTABLE CHEST 1 VIEW COMPARISON:  Chest x-ray 05/22/2019 FINDINGS: The endotracheal tube is 4.4 cm above the carina. Right IJ Swan-Ganz catheter tip is in the proximal right pulmonary artery. Impella device and good position without complicating features. Right PICC line is stable. NG tube coursing down the esophagus and into the stomach. Stable appearance of the heart and mediastinum. Stable calcification of the thoracic aorta. Increasing density at the left lung base could be progressive atelectasis or infiltrate. Underlying edema and atelectasis appears stable. IMPRESSION: 1. Support apparatus in good position without complicating features. 2. Increasing density at the left lung base could be progressive atelectasis or infiltrate. 3. Stable underlying edema and atelectasis. Electronically Signed   By: Marijo Sanes M.D.   On: 05/23/2019 07:52   Dg Chest Port 1 View  Result Date: 05/22/2019 CLINICAL DATA:  Status post bronchoscopy. EXAM: PORTABLE CHEST 1 VIEW COMPARISON:  Same day. FINDINGS: Stable cardiomediastinal silhouette. No pneumothorax or pleural effusion is noted. Endotracheal and nasogastric tubes are unchanged in position. Left ventricular assist device is unchanged. Right internal jugular Swan-Ganz catheter is unchanged. No acute  pulmonary disease is noted. Bony thorax is unremarkable. IMPRESSION: Stable support apparatus. No acute cardiopulmonary abnormality seen. Electronically Signed   By: Marijo Conception M.D.   On: 05/22/2019 11:36     Medications:     Scheduled Medications: . arformoterol  15 mcg Nebulization BID  . aspirin  81 mg Per Tube Daily  . atorvastatin  80 mg Per Tube q1800  . B-complex with vitamin C  1 tablet Oral Daily  . budesonide (PULMICORT) nebulizer solution  0.5 mg Nebulization BID  . chlorhexidine gluconate (MEDLINE KIT)  15 mL Mouth Rinse BID  . Chlorhexidine Gluconate Cloth  6 each Topical Daily  . feeding supplement (PRO-STAT SUGAR FREE 64)  30 mL Per Tube TID  . insulin aspart  1-3 Units Subcutaneous Q4H  . ipratropium  0.5 mg Nebulization Q6H  . lidocaine-EPINEPHrine  20  mL Infiltration Once  . mouth rinse  15 mL Mouth Rinse 10 times per day  . methylPREDNISolone (SOLU-MEDROL) injection  40 mg Intravenous Q12H  . pantoprazole (PROTONIX) IV  40 mg Intravenous QHS  . potassium chloride  20 mEq Oral Once  . sodium chloride flush  10-40 mL Intracatheter Q12H  . sodium chloride flush  3 mL Intravenous Q12H  . sodium chloride flush  3 mL Intravenous Q12H    Infusions: .  prismasol BGK 4/2.5 500 mL/hr at 05/23/19 2253  .  prismasol BGK 4/2.5 500 mL/hr at 05/23/19 2300  . sodium chloride 10 mL/hr at 05/20/19 0910  . sodium chloride Stopped (05/22/19 1301)  . sodium chloride 10 mL/hr at 05/24/19 0700  . sodium chloride    . amiodarone 30 mg/hr (05/24/19 0700)  . ceFEPime (MAXIPIME) IV Stopped (05/23/19 2337)  . feeding supplement (VITAL AF 1.2 CAL) 1,000 mL (05/23/19 1346)  . fentaNYL infusion INTRAVENOUS 250 mcg/hr (05/24/19 0700)  . impella catheter heparin 50 unit/mL in dextrose 5% 50,000 Units (05/23/19 0815)  . midazolam Stopped (05/24/19 0553)  . milrinone 0.25 mcg/kg/min (05/24/19 0700)  . norepinephrine (LEVOPHED) Adult infusion 10 mcg/min (05/24/19 0700)  . prismasol BGK  4/2.5 2,000 mL/hr at 05/24/19 0655  . vancomycin Stopped (05/23/19 1801)    PRN Medications: sodium chloride, Place/Maintain arterial line **AND** sodium chloride, sodium chloride, docusate, fentaNYL, fentaNYL (SUBLIMAZE) injection, fentaNYL (SUBLIMAZE) injection, heparin, influenza vaccine adjuvanted, ipratropium-albuterol, midazolam, midazolam, pneumococcal 23 valent vaccine, sodium chloride, sodium chloride flush, sodium chloride flush   Assessment/Plan:   1. Shock: Possible mixed cardiogenic/septic with fever and suspected PNA.  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  Echo with EF 20%, RV moderately HK.  Impella CP placed 10/23. Switched for Impella 5.0 on 10/24. No flow meter on Impella due to damage to sensor on insertion. Patient has LBBB.  On milrinone 0.25 and norepinephrine 10.  CI good at 2.5, co-ox 73.5%. CVP 12-13. CVVH ongoing with UF 200 cc/hr currently. - Continue current milrinone 0.25 and wean norepinephrine as able.  - Continue CVVH, aim for CVP around 9-10 with Impella.  - Continue Impella motor current stable this morning (sensor nonfunctional). LDH higher today at 883.  Haptoglobin low but from several days ago. Will assess position under echo again today and will decrease speed to P7 for now due to concern for hemolysis.  Heparin is present in purge fluid but not systemically.  2. CAD:  NSTEMI, hs-TnI 16,000. 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  - continue ASA/statin. No b-blocker with shock - CABG with Impella 5.0 support likely best option when able 3. AKI: Baseline creatinine 1.3., now AKI likely due to ATN from shock and contrast (had CTA for PE at admission, only 25 cc contrast with cath). Now on CVVH.  4. Acute Respiratory Failure: Pulmonary edema + PNA left base. Respiratory status better on CVVH.  -  Continue to pull fluid with CVVH.  -  Appreciate CCM help with vent.   5. ID: Suspected PNA, CXR  with left base density.  Now afebrile, WBCs still 17. PCT 0.72. - Negative culture data so far.  - Covering with vancomycin/cefepime.   6. LBBB: Unsure chronicity.  7. Anemia: Hgb lower today at 7.6 with increased LDH, suspect component of hemolysis.   - Assess position of Impella under echo.  - Will transfuse 1 unit PRBCs.  8.  Thrombocytopenia: Platelets up to 103K today, HIT negative.  Got 1 unit plts 10/29 pre-line placement.  ?Low due to sepsis versus hemolysis.    9. PVCs/NSVT: Amiodarone gtt. Quiescent.  10. FEN: Tube feeds.   CRITICAL CARE Performed by: Loralie Champagne  Total critical care time: 45 minutes  Critical care time was exclusive of separately billable procedures and treating other patients.  Critical care was necessary to treat or prevent imminent or life-threatening deterioration.  Critical care was time spent personally by me on the following activities: development of treatment plan with patient and/or surrogate as well as nursing, discussions with consultants, evaluation of patient's response to treatment, examination of patient, obtaining history from patient or surrogate, ordering and performing treatments and interventions, ordering and review of laboratory studies, ordering and review of radiographic studies, pulse oximetry and re-evaluation of patient's condition.  Loralie Champagne 05/24/2019 7:35 AM

## 2019-05-24 NOTE — Progress Notes (Signed)
[facility] ROUNDING NOTE   Subjective:   68 year old gentleman with a history of cardiogenic shock status post cardiac catheterization 05/19/2019 with severe three-vessel coronary disease.  Echocardiogram reveals ejection fraction of 20%.  Impella device placed 05/05/2019 with switch to 5.0 Impella 05/11/2019.  Continues on milrinone 0.25 mcg and norepinephrine 10 mcg.  Complicated by pneumonia treated with vancomycin/cefepime.  Although it appears that these antibiotics have now been discontinued.  Considering patient for CABG. patient underwent bronchoscopy 05/22/2019 appreciate assistance from critical care medicine.  Blood pressure 127/81 pulse 86 temperature afebrile O2 sats 93% FiO2 50% ventilator CVP 20  Sodium 135 potassium 4.5 chloride 100 CO2 22 BUN 47 creatinine 2.72 glucose 179 calcium 7.6 phosphorus 3.1 albumin 2.5   Aspirin 81 mg daily Lipitor 80 mg daily, Solu-Medrol 40 mg every 12 hours, Protonix 40 mg daily   Lasix 80 mg IV administered 05/21/2019 with brisk urine output.  Now placed on Lasix drip  Urine output 35 cc 05/23/2019.  CRRT 200 cc an hour  IV amiodarone IV milrinone IV norepinephrine IV Maxipime 2 g every 24 hours IV vancomycin 05/22/2019   Objective:  Vital signs in last 24 hours:  Temp:  [94.8 F (34.9 C)-99.1 F (37.3 C)] 98.2 F (36.8 C) (10/30 0900) Pulse Rate:  [79-88] 88 (10/30 0743) Resp:  [0-26] 14 (10/30 0900) BP: (90-135)/(60-94) 109/86 (10/30 0900) SpO2:  [87 %-100 %] 95 % (10/30 0900) Arterial Line BP: (86-146)/(54-92) 106/69 (10/30 0900) FiO2 (%):  [50 %-60 %] 50 % (10/30 0745) Weight:  [74.9 kg-78 kg] 74.9 kg (10/30 0500)  Weight change: 3 kg Filed Weights   05/23/19 0403 05/23/19 1245 05/24/19 0500  Weight: 75 kg (S) 78 kg 74.9 kg    Intake/Output: I/O last 3 completed shifts: In: 6335.1 [I.V.:2937.3; Blood:480; Other:517.9; NG/GT:2050; IV Piggyback:349.8] Out: 1610 [Urine:405; Other:4546]   Intake/Output this  shift:  Total I/O In: 254.5 [I.V.:126.1; Other:28.4; NG/GT:100] Out: 571 [Other:571]      General: Sedated NAD HEENT: MMM [place] AT anicteric sclera, orally intubated Neck:  No JVD, no adenopathy CV:  Heart RRR  Lungs:  L/S CTA bilaterally Abd:  abd SNT/ND with normal BS GU:  Bladder non-palpable, positive Foley with clear urine Extremities: +2 bilateral lower extremity edema Skin:  No skin rash   Basic Metabolic Panel: Recent Labs  Lab 05/19/19 0434 05/20/19 9604  05/21/19 0406  05/22/19 0225  05/22/19 1430 05/23/19 0259 05/23/19 0307 05/23/19 1618 05/24/19 0420 05/24/19 0429  NA 138 137   < > 136   < > 139   < > 139 135 135 136 134* 135  K 3.6 3.3*   < > 3.3*   < > 3.5   < > 4.2 4.5 4.5 4.4 4.3 4.5  CL 102 104  --  106  --  106  --  105 102  --  101 100  --   CO2 25 22  --  20*  --  22  --  22 20*  --  22 22  --   GLUCOSE 168* 176*  --  133*  --  145*  --  126* 175*  --  184* 179*  --   BUN 51* 56*  --  58*  --  58*  --  61* 72*  --  70* 47*  --   CREATININE 3.49* 3.90*  --  3.87*  --  3.55*  --  3.85* 4.67*  --  4.15* 2.72*  --   CALCIUM  7.8* 7.5*  --  7.9*  --  8.0*  --  8.2* 8.2*  --  7.9* PENDING  --   MG 2.0 2.0  --  2.1  --   --   --   --  2.0  --   --  2.4  --   PHOS 3.7 3.4  --  2.5  --   --   --   --  4.6  --  4.7* 3.1  --    < > = values in this interval not displayed.    Liver Function Tests: Recent Labs  Lab 05/25/2019 1200 05/22/2019 2127 05/19/19 0434 05/20/19 0637 05/21/19 0406 05/23/19 1618 05/24/19 0420  AST 130* 118* 64* 27 22  --   --   ALT _0 --   --   ALKPHOS 63 61 53 48 56  --   --   BILITOT 2.7* 1.8* 1.1 1.0 1.1  --   --   PROT 5.7* 5.3* 4.8* 4.8* 4.7*  --   --   ALBUMIN 2.7* 2.5* 2.2* 2.2* 2.2* 2.5* 2.5*   No results for input(s): LIPASE, AMYLASE in the last 168 hours. No results for input(s): AMMONIA in the last 168 hours.  CBC: Recent Labs  Lab 05/20/2019 1504  05/04/2019 2127  05/20/19 3762  05/21/19 0406   05/22/19 0225  05/22/19 0815 05/23/19 0259 05/23/19 0307 05/24/19 0420 05/24/19 0429  WBC 15.9*   < > 22.9*   < > 22.8*  --  13.4*  --  10.3  --   --  17.6*  --  17.9*  --   NEUTROABS 13.3*  --  17.4*  --   --   --   --   --   --   --   --   --   --   --   --   HGB 14.7   < > 11.7*   < > 7.9*   < > 7.4*   < > 9.0*   < > 8.5* 8.8* 8.8* 7.6* 7.5*  HCT 45.8   < > 33.3*   < > 23.4*   < > 22.4*   < > 28.3*   < > 25.0* 26.6* 26.0* 23.2* 22.0*  MCV 94.2   < > 87.4   < > 90.7  --  91.1  --  89.8  --   --  88.4  --  88.2  --   PLT 149*   < > 120*   < > 66*  --  36*  --  30*  --   --  48*  --  103*  --    < > = values in this interval not displayed.    Cardiac Enzymes: No results for input(s): CKTOTAL, CKMB, CKMBINDEX, TROPONINI in the last 168 hours.  BNP: Invalid input(s): POCBNP  CBG: Recent Labs  Lab 05/23/19 1533 05/23/19 1937 05/23/19 2349 05/24/19 0426 05/24/19 0802  GLUCAP 163* 142* 127* 163* 179*    Microbiology: Results for orders placed or performed during the hospital encounter of 05/09/2019  Urine culture     Status: None   Collection Time: 05/03/2019 12:16 AM   Specimen: Urine, Random  Result Value Ref Range Status   Specimen Description URINE, RANDOM  Final   Special Requests NONE  Final   Culture   Final    NO GROWTH Performed at [facility], [place] [address] [place], [place] [address]  Report Status 05/11/2019 FINAL  Final  Respiratory Panel by PCR     Status: None   Collection Time: 05/13/2019  1:58 AM   Specimen: Nasopharyngeal Swab; Respiratory  Result Value Ref Range Status   Adenovirus NOT DETECTED NOT DETECTED Final   Coronavirus 229E NOT DETECTED NOT DETECTED Final    Comment: (NOTE) The Coronavirus on the Respiratory Panel, DOES NOT test for the novel  Coronavirus (2019 nCoV)    Coronavirus HKU1 NOT DETECTED NOT DETECTED Final   Coronavirus NL63 NOT DETECTED NOT DETECTED Final   Coronavirus OC43 NOT DETECTED NOT DETECTED Final    Metapneumovirus NOT DETECTED NOT DETECTED Final   Rhinovirus / Enterovirus NOT DETECTED NOT DETECTED Final   Influenza A NOT DETECTED NOT DETECTED Final   Influenza B NOT DETECTED NOT DETECTED Final   Parainfluenza Virus 1 NOT DETECTED NOT DETECTED Final   Parainfluenza Virus 2 NOT DETECTED NOT DETECTED Final   Parainfluenza Virus 3 NOT DETECTED NOT DETECTED Final   Parainfluenza Virus 4 NOT DETECTED NOT DETECTED Final   Respiratory Syncytial Virus NOT DETECTED NOT DETECTED Final   Bordetella pertussis NOT DETECTED NOT DETECTED Final   Chlamydophila pneumoniae NOT DETECTED NOT DETECTED Final   Mycoplasma pneumoniae NOT DETECTED NOT DETECTED Final    Comment: Performed at [facility], [address], [place], [place] [address]  SARS Coronavirus 2 by RT PCR (hospital order, performed in [facility] hospital lab) Nasopharyngeal Nasopharyngeal Swab     Status: None   Collection Time: 05/12/2019  3:55 PM   Specimen: Nasopharyngeal Swab  Result Value Ref Range Status   SARS Coronavirus 2 NEGATIVE NEGATIVE Final    Comment: (NOTE) If result is NEGATIVE SARS-CoV-2 target nucleic acids are NOT DETECTED. The SARS-CoV-2 RNA is generally detectable in upper and lower  respiratory specimens during the acute phase of infection. The lowest  concentration of SARS-CoV-2 viral copies this assay can detect is 250  copies / mL. A negative result does not preclude SARS-CoV-2 infection  and should not be used as the sole basis for treatment or other  patient management decisions.  A negative result may occur with  improper specimen collection / handling, submission of specimen other  than nasopharyngeal swab, presence of viral mutation(s) within the  areas targeted by this assay, and inadequate number of viral copies  (<250 copies / mL). A negative result must be combined with clinical  observations, patient history, and epidemiological information. If result is POSITIVE SARS-CoV-2 target nucleic  acids are DETECTED. The SARS-CoV-2 RNA is generally detectable in upper and lower  respiratory specimens dur ing the acute phase of infection.  Positive  results are indicative of active infection with SARS-CoV-2.  Clinical  correlation with patient history and other diagnostic information is  necessary to determine patient infection status.  Positive results do  not rule out bacterial infection or co-infection with other viruses. If result is PRESUMPTIVE POSTIVE SARS-CoV-2 nucleic acids MAY BE PRESENT.   A presumptive positive result was obtained on the submitted specimen  and confirmed on repeat testing.  While 2019 novel coronavirus  (SARS-CoV-2) nucleic acids may be present in the submitted sample  additional confirmatory testing may be necessary for epidemiological  and / or clinical management purposes  to differentiate between  SARS-CoV-2 and other Sarbecovirus currently known to infect humans.  If clinically indicated additional testing with an alternate test  methodology [phone]) is advised. The SARS-CoV-2 RNA is generally  detectable in upper and lower  respiratory sp ecimens during the acute  phase of infection. The expected result is Negative. Fact Sheet for Patients:  StrictlyIdeas.no Fact Sheet for Healthcare Providers: BankingDealers.co.za This test is not yet approved or cleared by the [place] FDA and has been authorized for detection and/or diagnosis of SARS-CoV-2 by FDA under an Emergency Use Authorization (EUA).  This EUA will remain in effect (meaning this test can be used) for the duration of the COVID-19 declaration under Section 564(b)(1) of the Act, 21 U.S.C. section 360bbb-3(b)(1), unless the authorization is terminated or revoked sooner. Performed at [facility], [place] [address] [place], [place] [address]   Blood culture (routine x 2)     Status: None   Collection Time: 04/25/2019  9:00 PM    Specimen: BLOOD  Result Value Ref Range Status   Specimen Description BLOOD LEFT ANTECUBITAL  Final   Special Requests   Final    BOTTLES DRAWN AEROBIC AND ANAEROBIC Blood Culture adequate volume   Culture   Final    NO GROWTH 5 DAYS Performed at [facility], [place] [address] [place], [place] [address]    Report Status 05/21/2019 FINAL  Final  Blood culture (routine x 2)     Status: None   Collection Time: 05/20/2019  9:00 PM   Specimen: BLOOD RIGHT HAND  Result Value Ref Range Status   Specimen Description BLOOD RIGHT HAND  Final   Special Requests   Final    AEROBIC BOTTLE ONLY Blood Culture results may not be optimal due to an inadequate volume of blood received in culture bottles   Culture   Final    NO GROWTH 5 DAYS Performed at [facility], [place] [address], [place], [place] [address]    Report Status 05/21/2019 FINAL  Final  MRSA PCR Screening     Status: None   Collection Time: 05/14/2019 11:20 PM   Specimen: Nasal Mucosa; Nasopharyngeal  Result Value Ref Range Status   MRSA by PCR NEGATIVE NEGATIVE Final    Comment:        The GeneXpert MRSA Assay (FDA approved for NASAL specimens only), is one component of a comprehensive MRSA colonization surveillance program. It is not intended to diagnose MRSA infection nor to guide or monitor treatment for MRSA infections. Performed at [facility], [place] [address] [place], [place] [address]   Culture, respiratory (tracheal aspirate)     Status: None   Collection Time: 05/24/2019  3:50 AM   Specimen: Tracheal Aspirate; Respiratory  Result Value Ref Range Status   Specimen Description TRACHEAL ASPIRATE  Final   Special Requests NONE  Final   Gram Stain   Final    RARE WBC PRESENT, PREDOMINANTLY PMN RARE GRAM POSITIVE COCCI IN PAIRS    Culture   Final    RARE Consistent with normal respiratory flora. Performed at [facility], [place] [address], [place], [place] [address]    Report Status 05/20/2019 FINAL   Final  Culture, bal-quantitative     Status: None (Preliminary result)   Collection Time: 05/22/19 11:05 AM   Specimen: Bronchoalveolar Lavage; Respiratory  Result Value Ref Range Status   Specimen Description BRONCHIAL ALVEOLAR LAVAGE  Final   Special Requests Normal  Final   Gram Stain   Final    RARE WBC PRESENT, PREDOMINANTLY PMN NO ORGANISMS SEEN    Culture   Final    CULTURE REINCUBATED FOR BETTER GROWTH Performed at [facility], [address], [place], [place] [address]    Report Status PENDING  Incomplete  Culture, blood (routine x 2)     Status: None (Preliminary result)   Collection Time: 05/23/19  8:49 AM   Specimen: BLOOD  Result Value Ref Range Status   Specimen Description BLOOD LEFT ANTECUBITAL  Final   Special Requests   Final    BOTTLES DRAWN AEROBIC AND ANAEROBIC Blood Culture adequate volume   Culture   Final    NO GROWTH < 24 HOURS Performed at [facility], [place] [address] [place], [place] [address]    Report Status PENDING  Incomplete  Culture, blood (routine x 2)     Status: None (Preliminary result)   Collection Time: 05/23/19  8:52 AM   Specimen: BLOOD LEFT HAND  Result Value Ref Range Status   Specimen Description BLOOD LEFT HAND  Final   Special Requests   Final    BOTTLES DRAWN AEROBIC AND ANAEROBIC Blood Culture adequate volume   Culture   Final    NO GROWTH < 24 HOURS Performed at [facility], [place] [address] [place], [place] [address]    Report Status PENDING  Incomplete    Coagulation Studies: No results for input(s): LABPROT, INR in the last 72 hours.  Urinalysis: No results for input(s): COLORURINE, LABSPEC, PHURINE, GLUCOSEU, HGBUR, BILIRUBINUR, KETONESUR, PROTEINUR, UROBILINOGEN, NITRITE, LEUKOCYTESUR in the last 72 hours.  Invalid input(s): APPERANCEUR    Imaging: Dg Chest Port 1 View  Result Date: 05/23/2019 CLINICAL DATA:  Central line placement. EXAM: PORTABLE CHEST 1 VIEW COMPARISON:  Earlier film,  same date. FINDINGS: The endotracheal tube, Swan-Ganz catheter, right PICC line, NG tube and Impella device are stable since the earlier film. New left IJ central venous catheter tip is near the brachiocephalic SVC junction. No complicating features. Improved aeration since the earlier film may suggest improving edema and atelectasis. IMPRESSION: 1. Support apparatus in good position without complicating features. 2. New left IJ catheter tip is in the mid SVC. 3. Improved lung aeration since earlier film today. Electronically Signed   By: Marijo Sanes M.D.   On: 05/23/2019 12:05   Dg Chest Port 1 View  Result Date: 05/23/2019 CLINICAL DATA:  Respiratory failure. EXAM: PORTABLE CHEST 1 VIEW COMPARISON:  Chest x-ray 05/22/2019 FINDINGS: The endotracheal tube is 4.4 cm above the carina. Right IJ Swan-Ganz catheter tip is in the proximal right pulmonary artery. Impella device and good position without complicating features. Right PICC line is stable. NG tube coursing down the esophagus and into the stomach. Stable appearance of the heart and mediastinum. Stable calcification of the thoracic aorta. Increasing density at the left lung base could be progressive atelectasis or infiltrate. Underlying edema and atelectasis appears stable. IMPRESSION: 1. Support apparatus in good position without complicating features. 2. Increasing density at the left lung base could be progressive atelectasis or infiltrate. 3. Stable underlying edema and atelectasis. Electronically Signed   By: Marijo Sanes M.D.   On: 05/23/2019 07:52   Dg Chest Port 1 View  Result Date: 05/22/2019 CLINICAL DATA:  Status post bronchoscopy. EXAM: PORTABLE CHEST 1 VIEW COMPARISON:  Same day. FINDINGS: Stable cardiomediastinal silhouette. No pneumothorax or pleural effusion is noted. Endotracheal and nasogastric tubes are unchanged in position. Left ventricular assist device is unchanged. Right internal jugular Swan-Ganz catheter is unchanged. No  acute pulmonary disease is noted. Bony thorax is unremarkable. IMPRESSION: Stable support apparatus. No acute cardiopulmonary abnormality seen. Electronically Signed  By: Marijo Conception M.D.   On: 05/22/2019 11:36     Medications:   .  prismasol BGK 4/2.5 1 each (05/24/19 0929)  .  prismasol BGK 4/2.5 1 each (05/24/19 1245)  . sodium chloride 10 mL/hr at 05/20/19 0910  . sodium chloride Stopped (05/22/19 1301)  . sodium chloride 10 mL/hr at 05/24/19 0900  . sodium chloride    . amiodarone 30 mg/hr (05/24/19 0900)  . ceFEPime (MAXIPIME) IV Stopped (05/23/19 2337)  . feeding supplement (VITAL AF 1.2 CAL) 1,000 mL (05/23/19 1346)  . fentaNYL infusion INTRAVENOUS 175 mcg/hr (05/24/19 0900)  . impella catheter heparin 50 unit/mL in dextrose 5% 50,000 Units (05/23/19 0815)  . midazolam Stopped (05/24/19 0553)  . milrinone 0.25 mcg/kg/min (05/24/19 0900)  . norepinephrine (LEVOPHED) Adult infusion 10 mcg/min (05/24/19 0900)  . prismasol BGK 4/2.5 1 mL (05/24/19 0933)  . vancomycin Stopped (05/23/19 1801)   . sodium chloride   Intravenous Once  . arformoterol  15 mcg Nebulization BID  . aspirin  81 mg Per Tube Daily  . atorvastatin  80 mg Per Tube q1800  . B-complex with vitamin C  1 tablet Oral Daily  . budesonide (PULMICORT) nebulizer solution  0.5 mg Nebulization BID  . chlorhexidine gluconate (MEDLINE KIT)  15 mL Mouth Rinse BID  . Chlorhexidine Gluconate Cloth  6 each Topical Daily  . feeding supplement (PRO-STAT SUGAR FREE 64)  30 mL Per Tube TID  . insulin aspart  1-3 Units Subcutaneous Q4H  . ipratropium  0.5 mg Nebulization Q6H  . lidocaine-EPINEPHrine  20 mL Infiltration Once  . mouth rinse  15 mL Mouth Rinse 10 times per day  . methylPREDNISolone (SOLU-MEDROL) injection  40 mg Intravenous Q12H  . pantoprazole (PROTONIX) IV  40 mg Intravenous QHS  . potassium chloride  20 mEq Oral Once  . sodium chloride flush  10-40 mL Intracatheter Q12H  . sodium chloride flush  3 mL  Intravenous Q12H  . sodium chloride flush  3 mL Intravenous Q12H   sodium chloride, Place/Maintain arterial line **AND** sodium chloride, sodium chloride, docusate, fentaNYL, fentaNYL (SUBLIMAZE) injection, fentaNYL (SUBLIMAZE) injection, heparin, influenza vaccine adjuvanted, ipratropium-albuterol, midazolam, midazolam, pneumococcal 23 valent vaccine, sodium chloride, sodium chloride flush, sodium chloride flush  Assessment/ Plan:  1.Baseline serum creatinine 1.34 on admission.  2. Acute kidney injury. Likely secondary to decreased cardiac output in the setting of congestive heart failure. He may have had some component of contrast-induced nephropathy.     Continue to avoid nephrotoxins no ACE inhibitor's ARB use nonsteroidal inflammatory drugs IV contrast.  Creatinine appears to have worsened.  Blood pressure tenuous.  Oxygen requirements of increased.  Appreciate assistance from critical care medicine with bronchoscopy.  We will continue CRRT  3.  Hypertension/volume discontinued Lasix drip now on CRRT removing 200 cc an hour  3. Coronary artery disease. Triple-vessel disease noted on cardiac catheterization. For possible CABG. 75% distal left main stenosis  4.Acute hypoxemic respiratory failure. Secondary to congestive heart failure.   Looks relatively euvolemic.   5. Acute systolic congestive heart failure. Ejection fraction less than 20%. Continue Impella device support.   Being considered for possible CABG.  6.  Hypotension/volume continues on inotropic support.  Milrinone and Levophed  7.  Pneumonia.  Vancomycin, Maxipime and IV steroids administered 05/22/2019  8.  Hypokalemia repleted by primary service   LOS: [place] _0 _1 :26 AM

## 2019-05-24 NOTE — Progress Notes (Signed)
ANTICOAGULATION CONSULT NOTE - Follow up [place] for heparin Indication: Impella 5.0  No Known Allergies  Patient Measurements: Height: _0  (170.2 cm)(measured x3) Weight: 165 lb 2 oz (74.9 kg) IBW/kg (Calculated) : 66.1  Vital Signs: Temp: 99 F (37.2 C) (10/30 1115) Temp Source: Core (10/30 1056) BP: 98/61 (10/30 1115) Pulse Rate: 94 (10/30 1115)  Labs: Recent Labs    05/22/19 0225  05/22/19 0447  05/22/19 1700 05/23/19 0259 05/23/19 0307 05/23/19 1618 05/24/19 0420 05/24/19 0429 05/24/19 0814  HGB 9.0*   < >  --    < >  --  8.8* 8.8*  --  7.6* 7.5*  --   HCT 28.3*   < >  --    < >  --  26.6* 26.0*  --  23.2* 22.0*  --   PLT 30*  --   --   --   --  48*  --   --  103*  --   --   APTT  --   --  73*  --  78* 76*  --   --   --   --   --   HEPARINUNFRC  --   --   --   --   --   --   --   --   --   --  <0.10*  CREATININE 3.55*  --   --    < >  --  4.67*  --  4.15* 2.72*  --   --    < > = values in this interval not displayed.    Estimated Creatinine Clearance: 24 mL/min (A) (by C-G formula based on SCr of 2.72 mg/dL (H)).   Medical History: Past Medical History:  Diagnosis Date  . Acute respiratory failure with hypoxemia ([place]) 04/2019  . Carotid artery occlusion   . Hypertension     Medications:  Scheduled:  . sodium chloride   Intravenous Once  . arformoterol  15 mcg Nebulization BID  . aspirin  81 mg Per Tube Daily  . atorvastatin  80 mg Per Tube q1800  . B-complex with vitamin C  1 tablet Oral Daily  . budesonide (PULMICORT) nebulizer solution  0.5 mg Nebulization BID  . chlorhexidine gluconate (MEDLINE KIT)  15 mL Mouth Rinse BID  . Chlorhexidine Gluconate Cloth  6 each Topical Daily  . feeding supplement (PRO-STAT SUGAR FREE 64)  30 mL Per Tube TID  . insulin aspart  1-3 Units Subcutaneous Q4H  . ipratropium  0.5 mg Nebulization Q6H  . lidocaine-EPINEPHrine  20 mL Infiltration Once  . mouth rinse  15 mL Mouth Rinse 10 times per day   . methylPREDNISolone (SOLU-MEDROL) injection  40 mg Intravenous Q12H  . pantoprazole (PROTONIX) IV  40 mg Intravenous QHS  . potassium chloride  20 mEq Oral Once  . sodium chloride flush  10-40 mL Intracatheter Q12H  . sodium chloride flush  3 mL Intravenous Q12H  . sodium chloride flush  3 mL Intravenous Q12H   Infusions:  .  prismasol BGK 4/2.5 1 each (05/24/19 0929)  .  prismasol BGK 4/2.5 1 each (05/24/19 6761)  . sodium chloride 10 mL/hr at 05/20/19 0910  . sodium chloride Stopped (05/22/19 1301)  . sodium chloride 10 mL/hr at 05/24/19 1100  . sodium chloride    . amiodarone 30 mg/hr (05/24/19 1100)  . ceFEPime (MAXIPIME) IV Stopped (05/24/19 1038)  . feeding supplement (VITAL AF 1.2 CAL) 1,000 mL (05/23/19 1346)  .  fentaNYL infusion INTRAVENOUS 300 mcg/hr (05/24/19 1144)  . impella catheter heparin 50 unit/mL in dextrose 5% 50,000 Units (05/23/19 0815)  . midazolam Stopped (05/24/19 1022)  . milrinone 0.25 mcg/kg/min (05/24/19 1100)  . norepinephrine (LEVOPHED) Adult infusion 8 mcg/min (05/24/19 1100)  . prismasol BGK 4/2.5 1 mL (05/24/19 0933)  . vancomycin Stopped (05/23/19 1801)    Assessment: 70 yoM admitted with shock and concern for ACS now s/p Impella CP placement in cath lab.  Impella CP swapped out for 5.0 d/t hemolysis. LDH up 881. Heparin stopped on 10/27 and started bivalirudin systemically with concern for HIT. HIT antibody negative. Bivalirudin now off and heparin in purge solution only resumed 10/30. Holding off systemic heparin for anticoagulation pending improvement in pltc and hgb drop.  Purge solution running at 14.2 ml/hr, providing 710 units/hr of heparin. Heparin level this AM is undetectable. RN to check ACT this AM. No overt bleeding. Hgb low at 7.6 - receiving 1 unit of PRBC today. Pltc up 103.  Goal of Therapy:  ACT 160-180 Monitor platelets by anticoagulation protocol: Yes   Plan:  Continue heparin in purge solution only Hold off systemic  heparin while pltc still low and receiving blood products Monitor daily heparin level , ACT, CBC Monitor for bleeding  Vertis Kelch, PharmD PGY2 Cardiology Pharmacy Resident Phone [phone] 05/24/2019       11:49 AM  Please check [email or web address] for unit-specific pharmacist phone numbers

## 2019-05-24 NOTE — Progress Notes (Signed)
Dr. Loanne Drilling at bedside; patient given 100mg  Roc and plan for bronchoscopy at bedside.

## 2019-05-24 NOTE — Progress Notes (Signed)
Notified Dr. Loanne Drilling of continued breath stacking on vent and high peak pressures.  Patient bag lavaged by RT with little relief.  Dr. Loanne Drilling to come to bedside.

## 2019-05-24 NOTE — Progress Notes (Signed)
  Echocardiogram 2D Echocardiogram has been performed.  Raymond Chavez 05/24/2019, 12:05 PM

## 2019-05-24 NOTE — Progress Notes (Signed)
NAME:  Raymond Chavez, MRN:  [id number], DOB:  April 01, 1951, LOS: 8 ADMISSION DATE:  05/05/2019, CONSULTATION DATE:  04/29/2019 REFERRING MD:  Billy Fischer  CHIEF COMPLAINT:  SOB   Brief History   Raymond Chavez is a 68 y.o. male who was admitted with acute hypoxic respiratory failure due to acute pulmonary edema in setting of NSTEMI, cardiogenic shock, possible PNA, He required intubation in the ED after failing BiPAP. On impella for hemodynamic support CVTS and cardiology are discussing about CABG, LVAD  Past Medical History  HTN, CAD.  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  10/27-heparin stopped due to low platelets.  HIT panel sent.  Starting bivalirudin.  Off Levophed.  On minimal vent support but failed weaning trials 10/28 Bronchoscopy  Consults:  Cardiology PCCM Nephrology   Procedures:  ETT 10/22 >  Swan 10/22 > Impella 10/23 >> 10/24 Changed to 5.0 via right axillary >>   Significant Diagnostic Tests:  CTA chest 10/22 > no PE, b/l basilar consolidation, diffuse interlobular septal thickening with GGO's, b/l hilar adenopathy. Echo 10/23 > severely decreased LV function.  Cannot estimate EF  Micro Data:  Blood 10/22, 10/24>>> Sputum 10/22 >>>  Urine 10/22 >>> Urine Strep > Negative  RVP 10/22 > Neg MRSA PCR > Neg SARS CoV2 10/22 > neg.  Antimicrobials:  Vanc 10/22 > 10/25 Cefepime 10/22 >    Interim history/subjective:  Afebrile in the last 24 hours. Sedation weaned and follows simple commands. Resedated due to increased PA pressures. Started on CRRT for volume removal yesterday.  Objective:  Blood pressure 130/82, pulse 88, temperature 99 F (37.2 C), resp. rate 20, height _0  (1.702 m), weight 74.9 kg, SpO2 97 %. PAP: (42-84)/(24-43) 71/34 CVP:  [4 mmHg-27 mmHg] 17 mmHg CO:   [4.6 L/min-5.3 L/min] 4.6 L/min CI:  [2.5 L/min/m2-2.9 L/min/m2] 2.5 L/min/m2  Vent Mode: PRVC FiO2 (%):  [50 %-60 %] 50 % Set Rate:  [18 bmp] 18 bmp Vt Set:  [450 mL] 450 mL PEEP:  [5 cmH20] 5 cmH20 Plateau Pressure:  [12 cmH20-19 cmH20] 19 cmH20   Intake/Output Summary (Last 24 hours) at 05/24/2019 1128 Last data filed at 05/24/2019 1000 Gross per 24 hour  Intake 3663.49 ml  Output 5152 ml  Net -1488.51 ml   Filed Weights   05/23/19 0403 05/23/19 1245 05/24/19 0500  Weight: 75 kg (S) 78 kg 74.9 kg   Physical Exam: General: Critically ill-appearing, sedated HENT: Raymond Chavez, AT, ETT in place Eyes: EOMI, no scleral icterus Respiratory: Diminished breath sounds bilaterally.  No crackles, wheezing or rales Cardiovascular: Continuous hum GI: BS+, soft, nontender Extremities:-Edema,-tenderness Neuro: Sedated GU: Foley in place  Assessment & Plan:   Acute hypoxic respiratory failure requiring intubation - multifactorial due to LLL PNA, acute pulmonary edema in setting of cardiogenic shock  Continues to have intermittently high peak pressures possibly secondary to bronchospasm S/p bronchoscopy 10/28 Increased I:E ratio and TV to 8cc without improvement in double triggering. Reduced TV 450cc due to elevated pressures. Will resume sedation. Plan  Scheduled brovana, pulmicort, atrovent Continue steroids D2 Wean PEEP/FIO2 VAP CRRT for volume removal  Cardiogenic Shock in setting of severe 3V Disease with depressed LV Dysfunction  H/O MI 1998, CAD  EF 15-20% with decreased RV systolic function  30/09 RHC/LHC which showed severe 3V  CAD (LM 75%, LAD 100%, LCX 80%, RCA 80% prox) and severely depressed LV function Plan Cardiology/CT Surgery Following >> ??LVAD vs CABG  Continue milrinone, amiodarone Continue Impella Continue levophed for MAP goal >65 Continue preadmission ASA Hold preadmission amlodipine, atorvastatin, HCTZ, lisinopril CRRT for volume removeal  Co-comitant Septic  Shock Continue pressor support as above  Continue Vanc and Cefepime. Plan for 5-7 days. De-escalate pending clinical response and culture data Follow-up blood cultures Follow-up final BAL and blood cultures  Acute Kidney Injury  Plan Nephrology following CRRT for volume removal  Thrombocytopenia HIT panel negative Plan Heparin gtt  Best Practice:  Diet: Tube feeds Pain/Anxiety/Delirium protocol: Fentanyl gtt / Versed gtt and PRN.  RASS goal 0 to -1. VAP protocol: Yes.  DVT prophylaxis: SCD's /bivalirudin GI prophylaxis: PPI. Glucose control: SSI. Mobility: Bedrest. Code Status: Full. Family Communication: Updated per primary team Disposition: ICU.  Labs   CBC: Recent Labs  Lab 05/19/2019 1504  05/14/2019 2127  05/20/19 2831  05/21/19 0406  05/22/19 0225  05/22/19 0815 05/23/19 0259 05/23/19 0307 05/24/19 0420 05/24/19 0429  WBC 15.9*   < > 22.9*   < > 22.8*  --  13.4*  --  10.3  --   --  17.6*  --  17.9*  --   NEUTROABS 13.3*  --  17.4*  --   --   --   --   --   --   --   --   --   --   --   --   HGB 14.7   < > 11.7*   < > 7.9*   < > 7.4*   < > 9.0*   < > 8.5* 8.8* 8.8* 7.6* 7.5*  HCT 45.8   < > 33.3*   < > 23.4*   < > 22.4*   < > 28.3*   < > 25.0* 26.6* 26.0* 23.2* 22.0*  MCV 94.2   < > 87.4   < > 90.7  --  91.1  --  89.8  --   --  88.4  --  88.2  --   PLT 149*   < > 120*   < > 66*  --  36*  --  30*  --   --  48*  --  103*  --    < > = values in this interval not displayed.   Basic Metabolic Panel: Recent Labs  Lab 05/19/19 0434 05/20/19 5176  05/21/19 0406  05/22/19 0225  05/22/19 1430 05/23/19 0259 05/23/19 0307 05/23/19 1618 05/24/19 0420 05/24/19 0429  NA 138 137   < > 136   < > 139   < > 139 135 135 136 134* 135  K 3.6 3.3*   < > 3.3*   < > 3.5   < > 4.2 4.5 4.5 4.4 4.3 4.5  CL 102 104  --  106  --  106  --  105 102  --  101 100  --   CO2 25 22  --  20*  --  22  --  22 20*  --  22 22  --   GLUCOSE 168* 176*  --  133*  --  145*  --  126* 175*  --   184* 179*  --   BUN 51* 56*  --  58*  --  58*  --  61* 72*  --  70* 47*  --   CREATININE 3.49* 3.90*  --  3.87*  --  3.55*  --  3.85* 4.67*  --  4.15* 2.72*  --   CALCIUM 7.8* 7.5*  --  7.9*  --  8.0*  --  8.2* 8.2*  --  7.9* 7.6*  --   MG 2.0 2.0  --  2.1  --   --   --   --  2.0  --   --  2.4  --   PHOS 3.7 3.4  --  2.5  --   --   --   --  4.6  --  4.7* 3.1  --    < > = values in this interval not displayed.   GFR: Estimated Creatinine Clearance: 24 mL/min (A) (by C-G formula based on SCr of 2.72 mg/dL (H)). Recent Labs  Lab 05/24/2019 1752  05/23/2019 0448 05/08/2019 1200  05/19/2019 2327 05/19/19 0434  05/21/19 0406 05/21/19 1045 05/22/19 0225 05/23/19 0259 05/23/19 1217 05/24/19 0420  PROCALCITON  --    < >  --   --   --   --  3.64  --   --  0.98  --   --  0.72 0.40  WBC  --    < >  --   --    < >  --  23.0*   < > 13.4*  --  10.3 17.6*  --  17.9*  LATICACIDVEN 2.1*  --  2.1* 1.4  --  1.6  --   --   --   --   --   --   --   --    < > = values in this interval not displayed.   Liver Function Tests: Recent Labs  Lab 05/10/2019 1200 05/15/2019 2127 05/19/19 0434 05/20/19 0637 05/21/19 0406 05/23/19 1618 05/24/19 0420  AST 130* 118* 64* 27 22  --   --   ALT _0 --   --   ALKPHOS 63 61 53 48 56  --   --   BILITOT 2.7* 1.8* 1.1 1.0 1.1  --   --   PROT 5.7* 5.3* 4.8* 4.8* 4.7*  --   --   ALBUMIN 2.7* 2.5* 2.2* 2.2* 2.2* 2.5* 2.5*   No results for input(s): LIPASE, AMYLASE in the last 168 hours. No results for input(s): AMMONIA in the last 168 hours. ABG    Component Value Date/Time   PHART 7.371 05/24/2019 0429   PCO2ART 44.5 05/24/2019 0429   PO2ART 126.0 (H) 05/24/2019 0429   HCO3 25.9 05/24/2019 0429   TCO2 27 05/24/2019 0429   ACIDBASEDEF 5.3 (H) 05/23/2019 0335   O2SAT 73.5 05/24/2019 0430    Coagulation Profile: Recent Labs  Lab 05/23/2019 2127  INR 1.5*   Cardiac Enzymes: No results for input(s): CKTOTAL, CKMB, CKMBINDEX, TROPONINI in the last 168  hours. HbA1C: Hgb A1c MFr Bld  Date/Time Value Ref Range Status  05/03/2019 11:25 PM 5.5 4.8 - 5.6 % Final    Comment:    (NOTE) Pre diabetes:          5.7%-6.4% Diabetes:              >6.4% Glycemic control for   <7.0% adults with diabetes    CBG: Recent Labs  Lab 05/23/19 1533 05/23/19 1937 05/23/19 2349 05/24/19 0426 05/24/19 0802  GLUCAP 163* 142* 127* 163* 179*    Critical care time: 50 min   The patient is critically ill with multiple organ systems failure and requires high complexity decision making for assessment  and support, frequent evaluation and titration of therapies, application of advanced monitoring technologies and extensive interpretation of multiple databases.   Critical Care Time devoted to patient care services described in this note is 50 Minutes. This time reflects time of care of this signee Dr. Rodman Pickle. This critical care time does not reflect procedure time, or teaching time or supervisory time of PA/NP/Med student/Med Resident etc but could involve care discussion time.  Rodman Pickle, M.D. [facility] Pulmonary/Critical Care Medicine 05/24/2019 11:28 AM  Pager: [phone] After hours pager: [phone]

## 2019-05-25 ENCOUNTER — Inpatient Hospital Stay (HOSPITAL_COMMUNITY): Payer: PPO

## 2019-05-25 DIAGNOSIS — Z95811 Presence of heart assist device: Secondary | ICD-10-CM | POA: Diagnosis not present

## 2019-05-25 LAB — RENAL FUNCTION PANEL
Albumin: 3 g/dL — ABNORMAL LOW (ref 3.5–5.0)
Albumin: 2.8 g/dL — ABNORMAL LOW (ref 3.5–5.0)
Anion gap: 12 (ref 5–15)
Anion gap: 11 (ref 5–15)
BUN: 33 mg/dL — ABNORMAL HIGH (ref 8–23)
BUN: 41 mg/dL — ABNORMAL HIGH (ref 8–23)
CO2: 23 mmol/L (ref 22–32)
CO2: 23 mmol/L (ref 22–32)
Calcium: 8.1 mg/dL — ABNORMAL LOW (ref 8.9–10.3)
Calcium: 8.1 mg/dL — ABNORMAL LOW (ref 8.9–10.3)
Chloride: 101 mmol/L (ref 98–111)
Chloride: 102 mmol/L (ref 98–111)
Creatinine, Ser: 1.75 mg/dL — ABNORMAL HIGH (ref 0.61–1.24)
Creatinine, Ser: 1.67 mg/dL — ABNORMAL HIGH (ref 0.61–1.24)
GFR calc Af Amer: 45 mL/min — ABNORMAL LOW (ref >60)
GFR calc Af Amer: 48 mL/min — ABNORMAL LOW (ref >60)
GFR calc non Af Amer: 39 mL/min — ABNORMAL LOW (ref >60)
GFR calc non Af Amer: 41 mL/min — ABNORMAL LOW (ref >60)
Glucose, Bld: 193 mg/dL — ABNORMAL HIGH (ref 70–99)
Glucose, Bld: 116 mg/dL — ABNORMAL HIGH (ref 70–99)
Phosphorus: 3.6 mg/dL (ref 2.5–4.6)
Phosphorus: 2 mg/dL — ABNORMAL LOW (ref 2.5–4.6)
Potassium: 4.9 mmol/L (ref 3.5–5.1)
Potassium: 4.3 mmol/L (ref 3.5–5.1)
Sodium: 136 mmol/L (ref 135–145)
Sodium: 136 mmol/L (ref 135–145)

## 2019-05-25 LAB — HEPARIN LEVEL (UNFRACTIONATED): Heparin Unfractionated: <0.1 IU/mL — ABNORMAL LOW (ref 0.30–0.70)

## 2019-05-25 LAB — GLUCOSE, CAPILLARY
Glucose-Capillary: 192 mg/dL — ABNORMAL HIGH (ref 70–99)
Glucose-Capillary: 169 mg/dL — ABNORMAL HIGH (ref 70–99)
Glucose-Capillary: 174 mg/dL — ABNORMAL HIGH (ref 70–99)
Glucose-Capillary: 98 mg/dL (ref 70–99)
Glucose-Capillary: 134 mg/dL — ABNORMAL HIGH (ref 70–99)

## 2019-05-25 LAB — POCT I-STAT 7, (LYTES, BLD GAS, ICA,H+H)
Bicarbonate: 27.1 mmol/L (ref 20.0–28.0)
Calcium, Ion: 1.13 mmol/L — ABNORMAL LOW (ref 1.15–1.40)
HCT: 30 % — ABNORMAL LOW (ref 39.0–52.0)
Hemoglobin: 10.2 g/dL — ABNORMAL LOW (ref 13.0–17.0)
O2 Saturation: 100 %
Patient temperature: 35.7
Potassium: 4.8 mmol/L (ref 3.5–5.1)
Sodium: 134 mmol/L — ABNORMAL LOW (ref 135–145)
TCO2: 29 mmol/L (ref 22–32)
pCO2 arterial: 54.4 mmHg — ABNORMAL HIGH (ref 32.0–48.0)
pH, Arterial: 7.299 — ABNORMAL LOW (ref 7.350–7.450)
pO2, Arterial: 250 mmHg — ABNORMAL HIGH (ref 83.0–108.0)

## 2019-05-25 LAB — ECHOCARDIOGRAM LIMITED
Height: 67 in
Weight: 2627.88 oz

## 2019-05-25 LAB — COOXEMETRY PANEL
Carboxyhemoglobin: 1.8 % — ABNORMAL HIGH (ref 0.5–1.5)
Methemoglobin: 1.2 % (ref 0.0–1.5)
O2 Saturation: 77.2 %
Total hemoglobin: 10 g/dL — ABNORMAL LOW (ref 12.0–16.0)

## 2019-05-25 LAB — POCT ACTIVATED CLOTTING TIME
Activated Clotting Time: 109 seconds
Activated Clotting Time: 114 seconds
Activated Clotting Time: 114 seconds
Activated Clotting Time: 125 seconds
Activated Clotting Time: 131 seconds
Activated Clotting Time: 142 seconds
Activated Clotting Time: 142 seconds
Activated Clotting Time: 147 seconds
Activated Clotting Time: 153 seconds

## 2019-05-25 LAB — CULTURE, BAL-QUANTITATIVE W GRAM STAIN
Culture: 40000 — AB
Special Requests: NORMAL

## 2019-05-25 LAB — LACTATE DEHYDROGENASE: LDH: 911 U/L — ABNORMAL HIGH (ref 98–192)

## 2019-05-25 LAB — MAGNESIUM: Magnesium: 2.5 mg/dL — ABNORMAL HIGH (ref 1.7–2.4)

## 2019-05-25 LAB — CBC
HCT: 30.6 % — ABNORMAL LOW (ref 39.0–52.0)
Hemoglobin: 9.9 g/dL — ABNORMAL LOW (ref 13.0–17.0)
MCH: 29.5 pg (ref 26.0–34.0)
MCHC: 32.4 g/dL (ref 30.0–36.0)
MCV: 91.1 fL (ref 80.0–100.0)
Platelets: 109 10*3/uL — ABNORMAL LOW (ref 150–400)
RBC: 3.36 MIL/uL — ABNORMAL LOW (ref 4.22–5.81)
RDW: 16.5 % — ABNORMAL HIGH (ref 11.5–15.5)
WBC: 19 10*3/uL — ABNORMAL HIGH (ref 4.0–10.5)
nRBC: 0.4 % — ABNORMAL HIGH (ref 0.0–0.2)

## 2019-05-25 LAB — PROCALCITONIN: Procalcitonin: 0.45 ng/mL

## 2019-05-25 MED ORDER — B COMPLEX-C PO TABS
1.0000 | ORAL_TABLET | Freq: Every day | ORAL | Status: DC
  Administered 2019-05-26 – 2019-05-28 (×2): 1
  Filled 2019-05-25 (×3): qty 1

## 2019-05-25 MED ORDER — VANCOMYCIN HCL IN DEXTROSE 750-5 MG/150ML-% IV SOLN
750.0000 mg | INTRAVENOUS | Status: DC
  Administered 2019-05-25: 17:00:00 750 mg via INTRAVENOUS
  Filled 2019-05-25: qty 150

## 2019-05-25 MED ORDER — HEPARIN (PORCINE) 25000 UT/250ML-% IV SOLN
1000.0000 [IU]/h | INTRAVENOUS | Status: DC
  Administered 2019-05-25: 200 [IU]/h via INTRAVENOUS
  Administered 2019-05-26: 1400 [IU]/h via INTRAVENOUS
  Administered 2019-05-27: 1200 [IU]/h via INTRAVENOUS
  Administered 2019-05-28: 1100 [IU]/h via INTRAVENOUS
  Administered 2019-05-29: 1400 [IU]/h via INTRAVENOUS
  Administered 2019-05-29: 1000 [IU]/h via INTRAVENOUS
  Filled 2019-05-25 (×4): qty 250

## 2019-05-25 NOTE — Progress Notes (Signed)
NAME:  Raymond Chavez, MRN:  [id number], DOB:  1951-04-11, LOS: 9 ADMISSION DATE:  05/24/2019, CONSULTATION DATE:  04/29/2019 REFERRING MD:  Billy Fischer  CHIEF COMPLAINT:  SOB   Brief History   Raymond Chavez is a 68 y.o. male who was admitted with acute hypoxic respiratory failure due to acute pulmonary edema in setting of NSTEMI, cardiogenic shock, possible PNA, He required intubation in the ED after failing BiPAP. On impella for hemodynamic support CVTS and cardiology are discussing about CABG, LVAD  Past Medical History  HTN, CAD.  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  10/27-heparin stopped due to low platelets.  HIT panel sent.  Starting bivalirudin.  Off Levophed.  On minimal vent support but failed weaning trials 10/28 Bronchoscopy  10/29 Started on CRRT 10/30 Bronchoscopy with mucous plug removal  Consults:  Cardiology PCCM Nephrology   Procedures:  ETT 10/22 >  Swan 10/22 > Impella 10/23 >> 10/24 Changed to 5.0 via right axillary >>  Vas cath 10/29   Significant Diagnostic Tests:  CTA chest 10/22 > no PE, b/l basilar consolidation, diffuse interlobular septal thickening with GGO's, b/l hilar adenopathy. Echo 10/23 > severely decreased LV function.  Cannot estimate EF  Micro Data:  Blood 10/22, 10/24, 10/28, 10/29 >>> NGTD Sputum 10/22 >>>  Urine 10/22 >>> Urine Strep > Negative  RVP 10/22 > Neg MRSA PCR > Neg SARS CoV2 10/22 > neg. BAL 10/28 > pending  Antimicrobials:  Vanc 10/22 > 10/25 Cefepime 10/22 >    Interim history/subjective:  S/p bronchoscopy with removal of mucous plug resulting improvement in peak pressures. Net negative 4L fluid removal yesterday. On CRRT. Weaning sedation  Objective:  Blood pressure 107/81, pulse 70, temperature (!) 97.3 F (36.3 C), resp. rate  18, height _0  (1.702 m), weight 74.5 kg, SpO2 99 %. PAP: (38-84)/(21-45) 51/25 CVP:  [3 mmHg-29 mmHg] 6 mmHg CO:  [4 L/min-5.2 L/min] 4.2 L/min CI:  [2.3 L/min/m2-7.7 L/min/m2] 2.3 L/min/m2  Vent Mode: PRVC FiO2 (%):  [30 %-50 %] 30 % Set Rate:  [18 bmp] 18 bmp Vt Set:  [450 mL-550 mL] 550 mL PEEP:  [10 cmH20] 10 cmH20 Plateau Pressure:  [17 cmH20-21 cmH20] 21 cmH20   Intake/Output Summary (Last 24 hours) at 05/25/2019 0903 Last data filed at 05/25/2019 0800 Gross per 24 hour  Intake 3815.76 ml  Output 7603 ml  Net -3787.24 ml   Filed Weights   05/23/19 1245 05/24/19 0500 05/25/19 0645  Weight: (S) 78 kg 74.9 kg 74.5 kg   Physical Exam: General: Critically ill-appearing, sedated HENT: [place], AT, ETT in place Eyes: EOMI, no scleral icterus Respiratory: Clear to auscultation bilaterally.  No crackles, wheezing or rales Cardiovascular: Continuous hum GI: BS+, soft, nontender Extremities:-Edema,-tenderness Neuro: Sedated GU: Foley in place  CXR 05/25/19 - Impella in place. No effusions, edema or infiltrate  Assessment & Plan:   Acute hypoxic respiratory failure requiring intubation - multifactorial due to LLL PNA, acute pulmonary edema in setting of cardiogenic shock  Continues to have intermittently high peak pressures possibly secondary to bronchospasm S/p bronchoscopy 10/28 Increased I:E ratio and TV to 8cc without improvement in double triggering. Reduced TV 450cc due to elevated pressures. Will resume sedation. Plan  Scheduled brovana, pulmicort, atrovent DC steroids Wean PEEP/FIO2 VAP CRRT for volume removal  Wean sedation for trial SBT  Cardiogenic Shock in setting of severe 3V Disease with depressed LV Dysfunction  H/O MI 1998, CAD  EF 15-20% with decreased RV systolic function  35/57 RHC/LHC which showed severe 3V CAD (LM 75%, LAD 100%, LCX 80%, RCA 80% prox) and severely depressed LV function Plan Cardiology/CT Surgery Following >> ??LVAD vs CABG  Continue  milrinone, amiodarone Continue Impella Continue levophed for MAP goal >65 Continue preadmission ASA Hold preadmission amlodipine, atorvastatin, HCTZ, lisinopril CRRT for volume removeal  Co-comitant Septic Shock Continue pressor support as above  Discontinue Vanc  Continue Cefepime. Plan for 5-7 days Follow-up final BAL and blood cultures  Acute Kidney Injury  Plan Nephrology following CRRT for volume removal  Thrombocytopenia HIT panel negative Plan Heparin gtt  Best Practice:  Diet: Tube feeds Pain/Anxiety/Delirium protocol: Fentanyl gtt / Versed gtt and PRN.  RASS goal 0 to -1. VAP protocol: Yes.  DVT prophylaxis: SCD's /bivalirudin GI prophylaxis: PPI. Glucose control: SSI. Mobility: Bedrest. Code Status: Full. Family Communication: Updated per primary team. CCM last updated daughter on 10/31 Disposition: ICU.  Labs   CBC: Recent Labs  Lab 05/04/2019 2127  05/21/19 0406  05/22/19 0225  05/23/19 0259 05/23/19 3220 05/24/19 0420 05/24/19 0429 05/25/19 0341 05/25/19 0350  WBC 22.9*   < > 13.4*  --  10.3  --  17.6*  --  17.9*  --  19.0*  --   NEUTROABS 17.4*  --   --   --   --   --   --   --   --   --   --   --   HGB 11.7*   < > 7.4*   < > 9.0*   < > 8.8* 8.8* 7.6* 7.5* 9.9* 10.2*  HCT 33.3*   < > 22.4*   < > 28.3*   < > 26.6* 26.0* 23.2* 22.0* 30.6* 30.0*  MCV 87.4   < > 91.1  --  89.8  --  88.4  --  88.2  --  91.1  --   PLT 120*   < > 36*  --  30*  --  48*  --  103*  --  109*  --    < > = values in this interval not displayed.   Basic Metabolic Panel: Recent Labs  Lab 05/20/19 2542  05/21/19 0406  05/23/19 0259  05/23/19 1618 05/24/19 0420 05/24/19 0429 05/24/19 1609 05/25/19 0341 05/25/19 0350  NA 137   < > 136   < > 135   < > 136 134* 135 135 136 134*  K 3.3*   < > 3.3*   < > 4.5   < > 4.4 4.3 4.5 4.8 4.9 4.8  CL 104  --  106   < > 102  --  101 100  --  100 101  --   CO2 22  --  20*   < > 20*  --  22 22  --  24 23  --   GLUCOSE 176*  --  133*    < > 175*  --  184* 179*  --  160* 193*  --   BUN 56*  --  58*   < > 72*  --  70* 47*  --  38* 33*  --   CREATININE 3.90*  --  3.87*   < > 4.67*  --  4.15* 2.72*  --  2.09* 1.75*  --   CALCIUM 7.5*  --  7.9*   < >  8.2*  --  7.9* 7.6*  --  8.1* 8.1*  --   MG 2.0  --  2.1  --  2.0  --   --  2.4  --   --  2.5*  --   PHOS 3.4  --  2.5  --  4.6  --  4.7* 3.1  --  3.9 3.6  --    < > = values in this interval not displayed.   GFR: Estimated Creatinine Clearance: 37.2 mL/min (A) (by C-G formula based on SCr of 1.75 mg/dL (H)). Recent Labs  Lab 04/28/2019 1200  05/11/2019 2327 05/19/19 0434  05/21/19 1045 05/22/19 0225 05/23/19 0259 05/23/19 1217 05/24/19 0420 05/25/19 0341  PROCALCITON  --   --   --  3.64  --  0.98  --   --  0.72 0.40  --   WBC  --    < >  --  23.0*   < >  --  10.3 17.6*  --  17.9* 19.0*  LATICACIDVEN 1.4  --  1.6  --   --   --   --   --   --   --   --    < > = values in this interval not displayed.   Liver Function Tests: Recent Labs  Lab 04/25/2019 1200 05/11/2019 2127 05/19/19 0434 05/20/19 0637 05/21/19 0406 05/23/19 1618 05/24/19 0420 05/24/19 1609 05/25/19 0341  AST 130* 118* 64* 27 22  --   --   --   --   ALT _0 --   --   --   --   ALKPHOS 63 61 53 48 56  --   --   --   --   BILITOT 2.7* 1.8* 1.1 1.0 1.1  --   --   --   --   PROT 5.7* 5.3* 4.8* 4.8* 4.7*  --   --   --   --   ALBUMIN 2.7* 2.5* 2.2* 2.2* 2.2* 2.5* 2.5* 2.9* 3.0*   No results for input(s): LIPASE, AMYLASE in the last 168 hours. No results for input(s): AMMONIA in the last 168 hours. ABG    Component Value Date/Time   PHART 7.299 (L) 05/25/2019 0350   PCO2ART 54.4 (H) 05/25/2019 0350   PO2ART 250.0 (H) 05/25/2019 0350   HCO3 27.1 05/25/2019 0350   TCO2 29 05/25/2019 0350   ACIDBASEDEF 5.3 (H) 05/23/2019 0335   O2SAT 77.2 05/25/2019 0350   O2SAT 100.0 05/25/2019 0350    Coagulation Profile: Recent Labs  Lab 05/19/2019 2127  INR 1.5*   Cardiac Enzymes: No results for  input(s): CKTOTAL, CKMB, CKMBINDEX, TROPONINI in the last 168 hours. HbA1C: Hgb A1c MFr Bld  Date/Time Value Ref Range Status  05/20/2019 11:25 PM 5.5 4.8 - 5.6 % Final    Comment:    (NOTE) Pre diabetes:          5.7%-6.4% Diabetes:              >6.4% Glycemic control for   <7.0% adults with diabetes    CBG: Recent Labs  Lab 05/24/19 1624 05/24/19 1947 05/24/19 2312 05/25/19 0347 05/25/19 0833  GLUCAP 148* 185* 150* 192* 169*    Critical care time: 50 min   The patient is critically ill with multiple organ systems failure and requires high complexity decision making for assessment and support, frequent evaluation and titration of therapies, application of advanced monitoring technologies and extensive interpretation of multiple databases.  Critical Care Time devoted to patient care services described in this note is 50 Minutes. This time reflects time of care of this signee Dr. Rodman Pickle. Discussed care with HF team, bedside RN and daughter.  Rodman Pickle, M.D. [facility] Pulmonary/Critical Care Medicine 05/25/2019 9:03 AM  Pager: [phone] After hours pager: [phone]

## 2019-05-25 NOTE — Progress Notes (Signed)
Pt placed back on full vent support due to increased WOB and RR.  RN at bedside giving sedation.  ETT had came out 1cm, ETT advanced back to 24cm at lip.  PT had better tidal volumes and was calming down.  RT will continue to monitor.

## 2019-05-25 NOTE — Progress Notes (Signed)
After repositioning, patient became very tachypnic on ventilator.  Patient began breathing over 40 breathes per minute, patient very restless, CVP pressures increased to 20's, PAP increased to 70/40, patient coughing and fighting ventilator creating a cuff leak.  PRN bolus medication given and emotional support with no success in reassuring patient.  RT called to bedside and patient placed back on full ventilator support.  Sedation medication restarted to assist in patient's comfort on ventilator.   Daughter at bedside and aware of plan of care.

## 2019-05-25 NOTE — Progress Notes (Signed)
Raymond Chavez   Subjective:   68 year old gentleman with a history of cardiogenic shock status post cardiac catheterization 04/26/2019 with severe three-vessel coronary disease.  Echocardiogram reveals ejection fraction of 20%.  Impella device placed 05/21/2019 with switch to 5.0 Impella 05/13/2019.  Continues on milrinone 0.25 mcg and norepinephrine 10 mcg.  Complicated by pneumonia treated with vancomycin/cefepime.  Although it appears that these antibiotics have now been discontinued.  Considering patient for CABG. patient underwent bronchoscopy 05/22/2019 appreciate assistance from critical care medicine.  Blood pressure 112 74 pulse 79 temperature 97.7 O2 sats 99% 30% FiO2   Sodium 134 potassium 4.8 chloride 101 CO2 23 BUN 33 creatinine 1.75 glucose 193 phosphorus 3.6 magnesium 2.5 calcium 8.1 albumin 3.0 LDH 911 WBC 19 hemoglobin 10.2 platelets 189  Aspirin 81 mg daily Lipitor 80 mg daily, Protonix 40 mg daily     Urine output none recorded 05/23/2029 CRRT 200 cc an hour  IV amiodarone IV milrinone IV norepinephrine IV Maxipime 2 g every 24 hours IV vancomycin 05/22/2019   Objective:  Vital signs in last 24 hours:  Temp:  [95.9 F (35.5 C)-99.3 F (37.4 C)] 97.7 F (36.5 C) (10/31 0915) Pulse Rate:  [70-94] 70 (10/31 0419) Resp:  [10-28] 18 (10/31 0915) BP: (89-130)/(61-95) 96/74 (10/31 0900) SpO2:  [91 %-100 %] 99 % (10/31 0915) Arterial Line BP: (91-131)/(60-80) 112/74 (10/31 0915) FiO2 (%):  [30 %-50 %] 30 % (10/31 0805) Weight:  [74.5 kg] 74.5 kg (10/31 0645)  Weight change: -3.5 kg Filed Weights   05/23/19 1245 05/24/19 0500 05/25/19 0645  Weight: (S) 78 kg 74.9 kg 74.5 kg    Intake/Output: I/O last 3 completed shifts: In: 5607.4 [I.V.:2683.2; Blood:409.2; Other:445.1; NG/GT:1620; IV Piggyback:449.9] Out: 11127 [Urine:30; UVOZD:66440]   Intake/Output this shift:  Total I/O In: 412.5 [P.O.:80; I.V.:151.1; Other:21.4;  NG/GT:160] Out: 148 [Other:148]      General: Sedated NAD HEENT: MMM [place] AT anicteric sclera, orally intubated Neck:  No JVD, no adenopathy CV:  Heart RRR  Lungs:  L/S CTA bilaterally Abd:  abd SNT/ND with normal BS GU:  Bladder non-palpable, positive Foley with clear urine Extremities: +2 bilateral lower extremity edema Skin:  No skin rash   Basic Metabolic Panel: Recent Labs  Lab 05/20/19 3474  05/21/19 0406  05/23/19 0259  05/23/19 1618 05/24/19 0420 05/24/19 0429 05/24/19 1609 05/25/19 0341 05/25/19 0350  NA 137   < > 136   < > 135   < > 136 134* 135 135 136 134*  K 3.3*   < > 3.3*   < > 4.5   < > 4.4 4.3 4.5 4.8 4.9 4.8  CL 104  --  106   < > 102  --  101 100  --  100 101  --   CO2 22  --  20*   < > 20*  --  22 22  --  24 23  --   GLUCOSE 176*  --  133*   < > 175*  --  184* 179*  --  160* 193*  --   BUN 56*  --  58*   < > 72*  --  70* 47*  --  38* 33*  --   CREATININE 3.90*  --  3.87*   < > 4.67*  --  4.15* 2.72*  --  2.09* 1.75*  --   CALCIUM 7.5*  --  7.9*   < > 8.2*  --  7.9* 7.6*  --  8.1* 8.1*  --   MG 2.0  --  2.1  --  2.0  --   --  2.4  --   --  2.5*  --   PHOS 3.4  --  2.5  --  4.6  --  4.7* 3.1  --  3.9 3.6  --    < > = values in this interval not displayed.    Liver Function Tests: Recent Labs  Lab 05/12/2019 1200 05/12/2019 2127 05/19/19 0434 05/20/19 0637 05/21/19 0406 05/23/19 1618 05/24/19 0420 05/24/19 1609 05/25/19 0341  AST 130* 118* 64* 27 22  --   --   --   --   ALT 30 24 22 16 12  --   --   --   --   ALKPHOS 63 61 53 48 56  --   --   --   --   BILITOT 2.7* 1.8* 1.1 1.0 1.1  --   --   --   --   PROT 5.7* 5.3* 4.8* 4.8* 4.7*  --   --   --   --   ALBUMIN 2.7* 2.5* 2.2* 2.2* 2.2* 2.5* 2.5* 2.9* 3.0*   No results for input(s): LIPASE, AMYLASE in the last 168 hours. No results for input(s): AMMONIA in the last 168 hours.  CBC: Recent Labs  Lab 05/11/2019 2127  05/21/19 0406  05/22/19 0225  05/23/19 0259 05/23/19 0307 05/24/19 0420  05/24/19 0429 05/25/19 0341 05/25/19 0350  WBC 22.9*   < > 13.4*  --  10.3  --  17.6*  --  17.9*  --  19.0*  --   NEUTROABS 17.4*  --   --   --   --   --   --   --   --   --   --   --   HGB 11.7*   < > 7.4*   < > 9.0*   < > 8.8* 8.8* 7.6* 7.5* 9.9* 10.2*  HCT 33.3*   < > 22.4*   < > 28.3*   < > 26.6* 26.0* 23.2* 22.0* 30.6* 30.0*  MCV 87.4   < > 91.1  --  89.8  --  88.4  --  88.2  --  91.1  --   PLT 120*   < > 36*  --  30*  --  48*  --  103*  --  109*  --    < > = values in this interval not displayed.    Cardiac Enzymes: No results for input(s): CKTOTAL, CKMB, CKMBINDEX, TROPONINI in the last 168 hours.  BNP: Invalid input(s): POCBNP  CBG: Recent Labs  Lab 05/24/19 1624 05/24/19 1947 05/24/19 2312 05/25/19 0347 05/25/19 0833  GLUCAP 148* 185* 150* 192* 169*    Microbiology: Results for orders placed or performed during the hospital encounter of 05/23/2019  Urine culture     Status: None   Collection Time: 05/11/2019 12:16 AM   Specimen: Urine, Random  Result Value Ref Range Status   Specimen Description URINE, RANDOM  Final   Special Requests NONE  Final   Culture   Final    NO GROWTH Performed at [facility], [address] [place], [place] [address]    Report Status 04/27/2019 FINAL  Final  Respiratory Panel by PCR     Status: None   Collection Time: 05/15/2019  1:58 AM   Specimen: Nasopharyngeal Swab; Respiratory  Result Value Ref Range Status   Adenovirus NOT DETECTED   NOT DETECTED Final   Coronavirus 229E NOT DETECTED NOT DETECTED Final    Comment: (Chavez) The Coronavirus on the Respiratory Panel, DOES NOT test for the novel  Coronavirus (2019 nCoV)    Coronavirus HKU1 NOT DETECTED NOT DETECTED Final   Coronavirus NL63 NOT DETECTED NOT DETECTED Final   Coronavirus OC43 NOT DETECTED NOT DETECTED Final   Metapneumovirus NOT DETECTED NOT DETECTED Final   Rhinovirus / Enterovirus NOT DETECTED NOT DETECTED Final   Influenza A NOT DETECTED NOT DETECTED Final    Influenza B NOT DETECTED NOT DETECTED Final   Parainfluenza Virus 1 NOT DETECTED NOT DETECTED Final   Parainfluenza Virus 2 NOT DETECTED NOT DETECTED Final   Parainfluenza Virus 3 NOT DETECTED NOT DETECTED Final   Parainfluenza Virus 4 NOT DETECTED NOT DETECTED Final   Respiratory Syncytial Virus NOT DETECTED NOT DETECTED Final   Bordetella pertussis NOT DETECTED NOT DETECTED Final   Chlamydophila pneumoniae NOT DETECTED NOT DETECTED Final   Mycoplasma pneumoniae NOT DETECTED NOT DETECTED Final    Comment: Performed at [facility], [address] [place], [place] [address]  SARS Coronavirus 2 by RT PCR (hospital order, performed in [place] hospital lab) Nasopharyngeal Nasopharyngeal Swab     Status: None   Collection Time: 05/12/2019  3:55 PM   Specimen: Nasopharyngeal Swab  Result Value Ref Range Status   SARS Coronavirus 2 NEGATIVE NEGATIVE Final    Comment: (Chavez) If result is NEGATIVE SARS-CoV-2 target nucleic acids are NOT DETECTED. The SARS-CoV-2 RNA is generally detectable in upper and lower  respiratory specimens during the acute phase of infection. The lowest  concentration of SARS-CoV-2 viral copies this assay can detect is 250  copies / mL. A negative result does not preclude SARS-CoV-2 infection  and should not be used as the sole basis for treatment or other  patient management decisions.  A negative result may occur with  improper specimen collection / handling, submission of specimen other  than nasopharyngeal swab, presence of viral mutation(s) within the  areas targeted by this assay, and inadequate number of viral copies  (<250 copies / mL). A negative result must be combined with clinical  observations, patient history, and epidemiological information. If result is POSITIVE SARS-CoV-2 target nucleic acids are DETECTED. The SARS-CoV-2 RNA is generally detectable in upper and lower  respiratory specimens dur ing the acute phase of infection.  Positive   results are indicative of active infection with SARS-CoV-2.  Clinical  correlation with patient history and other diagnostic information is  necessary to determine patient infection status.  Positive results do  not rule out bacterial infection or co-infection with other viruses. If result is PRESUMPTIVE POSTIVE SARS-CoV-2 nucleic acids MAY BE PRESENT.   A presumptive positive result was obtained on the submitted specimen  and confirmed on repeat testing.  While 2019 novel coronavirus  (SARS-CoV-2) nucleic acids may be present in the submitted sample  additional confirmatory testing may be necessary for epidemiological  and / or clinical management purposes  to differentiate between  SARS-CoV-2 and other Sarbecovirus currently known to infect humans.  If clinically indicated additional testing with an alternate test  methodology (LAB7453) is advised. The SARS-CoV-2 RNA is generally  detectable in upper and lower respiratory sp ecimens during the acute  phase of infection. The expected result is Negative. Fact Sheet for Patients:  https://www.fda.gov/media/136312/download Fact Sheet for Healthcare Providers: https://www.fda.gov/media/136313/download This test is not yet approved or cleared by the [place] FDA and has   been authorized for detection and/or diagnosis of SARS-CoV-2 by FDA under an Emergency Use Authorization (EUA).  This EUA will remain in effect (meaning this test can be used) for the duration of the COVID-19 declaration under Section 564(b)(1) of the Act, 21 U.S.C. section 360bbb-3(b)(1), unless the authorization is terminated or revoked sooner. Performed at [facility], [place] [address], [place], [place] [address]   Blood culture (routine x 2)     Status: None   Collection Time: 05/20/2019  9:00 PM   Specimen: BLOOD  Result Value Ref Range Status   Specimen Description BLOOD LEFT ANTECUBITAL  Final   Special Requests   Final    BOTTLES DRAWN AEROBIC AND  ANAEROBIC Blood Culture adequate volume   Culture   Final    NO GROWTH 5 DAYS Performed at [facility], [place] [address] [place], [place] [address]    Report Status 05/21/2019 FINAL  Final  Blood culture (routine x 2)     Status: None   Collection Time: 04/26/2019  9:00 PM   Specimen: BLOOD RIGHT HAND  Result Value Ref Range Status   Specimen Description BLOOD RIGHT HAND  Final   Special Requests   Final    AEROBIC BOTTLE ONLY Blood Culture results may not be optimal due to an inadequate volume of blood received in culture bottles   Culture   Final    NO GROWTH 5 DAYS Performed at [facility], [place] [address] [place], [place] [address]    Report Status 05/21/2019 FINAL  Final  MRSA PCR Screening     Status: None   Collection Time: 05/01/2019 11:20 PM   Specimen: Nasal Mucosa; Nasopharyngeal  Result Value Ref Range Status   MRSA by PCR NEGATIVE NEGATIVE Final    Comment:        The GeneXpert MRSA Assay (FDA approved for NASAL specimens only), is one component of a comprehensive MRSA colonization surveillance program. It is not intended to diagnose MRSA infection nor to guide or monitor treatment for MRSA infections. Performed at [facility], [place] [address], [place], [place] [address]   Culture, respiratory (tracheal aspirate)     Status: None   Collection Time: 05/11/2019  3:50 AM   Specimen: Tracheal Aspirate; Respiratory  Result Value Ref Range Status   Specimen Description TRACHEAL ASPIRATE  Final   Special Requests NONE  Final   Gram Stain   Final    RARE WBC PRESENT, PREDOMINANTLY PMN RARE GRAM POSITIVE COCCI IN PAIRS    Culture   Final    RARE Consistent with normal respiratory flora. Performed at [facility], [place] [address], [place], [place] [address]    Report Status 05/20/2019 FINAL  Final  Culture, bal-quantitative     Status: None (Preliminary result)   Collection Time: 05/22/19 11:05 AM   Specimen: Bronchoalveolar Lavage; Respiratory   Result Value Ref Range Status   Specimen Description BRONCHIAL ALVEOLAR LAVAGE  Final   Special Requests Normal  Final   Gram Stain   Final    RARE WBC PRESENT, PREDOMINANTLY PMN NO ORGANISMS SEEN    Culture   Final    CULTURE REINCUBATED FOR BETTER GROWTH Performed at [facility], [place] [address], [place], [place] [address]    Report Status PENDING  Incomplete  Culture, blood (routine x 2)     Status: None (Preliminary result)   Collection Time: 05/23/19  8:49 AM   Specimen: BLOOD  Result Value Ref Range Status   Specimen Description BLOOD LEFT ANTECUBITAL  Final   Special Requests   Final    BOTTLES DRAWN AEROBIC AND ANAEROBIC Blood Culture adequate volume   Culture   Final    NO GROWTH 2 DAYS Performed at [facility], [address] [place], [place] [address]    Report Status PENDING  Incomplete  Culture, blood (routine x 2)     Status: None (Preliminary result)   Collection Time: 05/23/19  8:52 AM   Specimen: BLOOD LEFT HAND  Result Value Ref Range Status   Specimen Description BLOOD LEFT HAND  Final   Special Requests   Final    BOTTLES DRAWN AEROBIC AND ANAEROBIC Blood Culture adequate volume   Culture   Final    NO GROWTH 2 DAYS Performed at [facility], [address] [place], [place] [address]    Report Status PENDING  Incomplete    Coagulation Studies: No results for input(s): LABPROT, INR in the last 72 hours.  Urinalysis: No results for input(s): COLORURINE, LABSPEC, PHURINE, GLUCOSEU, HGBUR, BILIRUBINUR, KETONESUR, PROTEINUR, UROBILINOGEN, NITRITE, LEUKOCYTESUR in the last 72 hours.  Invalid input(s): APPERANCEUR    Imaging: Dg Chest Port 1 View  Result Date: 05/24/2019 CLINICAL DATA:  Endotracheal intubation. EXAM: PORTABLE CHEST 1 VIEW COMPARISON:  May 23, 2019. FINDINGS: The heart size and mediastinal contours are within normal limits. Endotracheal and nasogastric tubes are unchanged. Right-sided PICC line is unchanged.  Stable left internal jugular catheter. Stable Swan-Ganz catheter in the right internal jugular vein with tip directed toward right pulmonary artery. No pneumothorax or pleural effusion is noted. Stable left ventricular assist device is noted. No acute pulmonary disease is noted. The visualized skeletal structures are unremarkable. IMPRESSION: Stable support apparatus as described above. No acute cardiopulmonary abnormality seen. Electronically Signed   By: James  Green Jr M.D.   On: 05/24/2019 12:10   Dg Chest Port 1 View  Result Date: 05/23/2019 CLINICAL DATA:  Central line placement. EXAM: PORTABLE CHEST 1 VIEW COMPARISON:  Earlier film, same date. FINDINGS: The endotracheal tube, Swan-Ganz catheter, right PICC line, NG tube and Impella device are stable since the earlier film. New left IJ central venous catheter tip is near the brachiocephalic SVC junction. No complicating features. Improved aeration since the earlier film may suggest improving edema and atelectasis. IMPRESSION: 1. Support apparatus in good position without complicating features. 2. New left IJ catheter tip is in the mid SVC. 3. Improved lung aeration since earlier film today. Electronically Signed   By: P.  Gallerani M.D.   On: 05/23/2019 12:05     Medications:   .  prismasol BGK 4/2.5 500 mL/hr at 05/25/19 0516  .  prismasol BGK 4/2.5 500 mL/hr at 05/25/19 0516  . sodium chloride 10 mL/hr at 05/20/19 0910  . sodium chloride 10 mL/hr at 05/25/19 0900  . sodium chloride 10 mL/hr at 05/25/19 0900  . sodium chloride    . amiodarone Stopped (05/25/19 0858)  . ceFEPime (MAXIPIME) IV Stopped (05/24/19 2145)  . feeding supplement (VITAL AF 1.2 CAL) 50 mL/hr at 05/25/19 0700  . fentaNYL infusion INTRAVENOUS Stopped (05/25/19 0848)  . impella catheter heparin 50 unit/mL in dextrose 5% 50,000 Units (05/23/19 0815)  . midazolam Stopped (05/25/19 0742)  . milrinone Stopped (05/25/19 0858)  . norepinephrine (LEVOPHED) Adult infusion 4  mcg/min (05/25/19 0900)  . prismasol BGK 4/2.5 2,000 mL/hr at 05/25/19 0843   . sodium chloride   Intravenous   Once  . arformoterol  15 mcg Nebulization BID  . aspirin  81 mg Per Tube Daily  . atorvastatin  80 mg Per Tube q1800  . B-complex with vitamin C  1 tablet Oral Daily  . budesonide (PULMICORT) nebulizer solution  0.5 mg Nebulization BID  . chlorhexidine gluconate (MEDLINE KIT)  15 mL Mouth Rinse BID  . Chlorhexidine Gluconate Cloth  6 each Topical Daily  . feeding supplement (PRO-STAT SUGAR FREE 64)  30 mL Per Tube TID  . insulin aspart  1-3 Units Subcutaneous Q4H  . ipratropium  0.5 mg Nebulization Q6H  . lidocaine-EPINEPHrine  20 mL Infiltration Once  . mouth rinse  15 mL Mouth Rinse 10 times per day  . pantoprazole (PROTONIX) IV  40 mg Intravenous QHS  . potassium chloride  20 mEq Oral Once  . sodium chloride flush  10-40 mL Intracatheter Q12H  . sodium chloride flush  3 mL Intravenous Q12H  . sodium chloride flush  3 mL Intravenous Q12H   sodium chloride, Place/Maintain arterial line **AND** sodium chloride, sodium chloride, docusate, fentaNYL, fentaNYL (SUBLIMAZE) injection, fentaNYL (SUBLIMAZE) injection, heparin, influenza vaccine adjuvanted, ipratropium-albuterol, midazolam, pneumococcal 23 valent vaccine, sodium chloride, sodium chloride flush, sodium chloride flush  Assessment/ Plan:  1.Baseline serum creatinine 1.34 on admission.  2. Acute kidney injury. Likely secondary to decreased cardiac output in the setting of congestive heart failure. He may have had some component of contrast-induced nephropathy.     Continue to avoid nephrotoxins no ACE inhibitor's ARB use nonsteroidal inflammatory drugs IV contrast.  Creatinine appears to have worsened.  Blood pressure tenuous.  Oxygen requirements of increased.  Appreciate assistance from critical care medicine with bronchoscopy.  We will continue CRRT  3.  Hypertension/volume  CRRT removing 200 cc an hour  3.  Coronary artery disease. Triple-vessel disease noted on cardiac catheterization. For possible CABG. 75% distal left main stenosis  4.Acute hypoxemic respiratory failure. Secondary to congestive heart failure.   Looks relatively euvolemic.   5. Acute systolic congestive heart failure. Ejection fraction less than 20%. Continue Impella device support.   Being considered for possible CABG.  6.  Hypotension/volume continues on inotropic support.  Milrinone and Levophed  7.  Pneumonia.  Vancomycin, Maxipime  administered 05/22/2019  8.  Hypokalemia repleted by primary service   LOS: [place] _0 _1 :33 AM

## 2019-05-25 NOTE — Progress Notes (Signed)
Patient stacking breathes on ventilator and breathing 30 breathes per minute while coughing and fighting ventilator causing a cuff leak to be present.  PRN medication given and respiratory therapist notified.

## 2019-05-25 NOTE — Progress Notes (Addendum)
Patient ID: Raymond Chavez, male   DOB: 04/07/1951, 68 y.o.   MRN: [id number]    Advanced Heart Failure Rounding Note   Subjective:    Underwent placement of Impella 5.0 on 10/24 with removal of R femoral Impella CP.   CVVH started on 10/29.  Currently UF 200 cc/hr.  UOP only 15 cc yesterday. Weight coming down towards his baseline.  CVP is only 6-7 today.   Bronchoscopy yesterday for mucus plugging.   Now afebrile, WBCs 19 today, patient is on cefepime and vancomycin.   Norepinephrine 7 mcg + milrinone 0.25.    Plts higher at 109 K this morning, HIT negative. Now with heparin through purge.   He will wake up and follow commands.   Swan #s  PA 42/25 CVP 6-7  CI 2.3 Co-ox 77%  Impella P-7 No flows due to damage of sensor during placement. Motor current stable. LDH higher at 881 => 911.   RHC/LHC (10/23):  Coronary Findings  Diagnostic Dominance: Right Left Main  75% distal left main stenosis.  Left Anterior Descending  Occluded proximally, some late filling by collaterals.  Ramus Intermedius  Large vessel, patent with luminal irregularities.  Left Circumflex  80% mid LCx stenosis. Small OM1 with 95% proximal stenosis. Large OM2 is subtotally occluded (fills late with TIMI 2 flow).  Right Coronary Artery  80% proximal stenosis. Serial 50% mid-vessel stenoses. PDA with only luminal irregularities. Large PLV with 80% mid-vessel stenosis.  Intervention  No interventions have been documented. Right Heart  Right Heart Pressures RHC Procedural Findings (mmHg): Hemodynamics RA mean 13 RV 70/17 PA 73/41, mean 53 PCWP mean 30 LV 140/40 AO 138/88  Oxygen saturations: PA 60% AO 97%  Cardiac Output (Fick) 2.95  Cardiac Index (Fick) 1.62 PVR 7.8 WU  Cardiac Output (Thermo) 3.24 Cardiac Index (Thermo) 1.78  PVR 7.1 WU  PAPI 2.46    Objective:   Weight Range:  Vital Signs:   Temp:  [95.9 F (35.5 C)-99.3 F (37.4 C)] 97.2 F (36.2 C) (10/31 0800) Pulse  Rate:  [70-94] 70 (10/31 0419) Resp:  [10-28] 15 (10/31 0800) BP: (91-130)/(61-95) 92/69 (10/31 0600) SpO2:  [87 %-100 %] 99 % (10/31 0800) Arterial Line BP: (92-131)/(60-80) 92/63 (10/31 0700) FiO2 (%):  [30 %-50 %] 30 % (10/31 0538) Weight:  [74.5 kg] 74.5 kg (10/31 0645) Last BM Date: 05/20/19  Weight change: Filed Weights   05/23/19 1245 05/24/19 0500 05/25/19 0645  Weight: (S) 78 kg 74.9 kg 74.5 kg    Intake/Output:   Intake/Output Summary (Last 24 hours) at 05/25/2019 0800 Last data filed at 05/25/2019 0700 Gross per 24 hour  Intake 3713.62 ml  Output 7865 ml  Net -4151.38 ml     Physical Exam: CVP 6-7  General: Intubated, opens eyes.  Neck: No JVD, no thyromegaly or thyroid nodule.  Lungs: Decreased BS at bases.  CV: Nondisplaced PMI.  Heart regular S1/S2, no S3/S4, no murmur.  Impella sounds. No peripheral edema.   Abdomen: Soft, nontender, no hepatosplenomegaly, no distention.  Skin: Intact without lesions or rashes.  Neurologic: Will wake up and follow commands.  Extremities: No clubbing or cyanosis.  HEENT: Normal.   Telemetry: NSR 90s (personally reviewed)  Labs: Basic Metabolic Panel: Recent Labs  Lab 05/20/19 0637  05/21/19 0406  05/23/19 0259  05/23/19 1618 05/24/19 0420 05/24/19 0429 05/24/19 1609 05/25/19 0341 05/25/19 0350  NA 137   < > 136   < > 135   < > 136 134* 135   135 136 134*  K 3.3*   < > 3.3*   < > 4.5   < > 4.4 4.3 4.5 4.8 4.9 4.8  CL 104  --  106   < > 102  --  101 100  --  100 101  --   CO2 22  --  20*   < > 20*  --  22 22  --  24 23  --   GLUCOSE 176*  --  133*   < > 175*  --  184* 179*  --  160* 193*  --   BUN 56*  --  58*   < > 72*  --  70* 47*  --  38* 33*  --   CREATININE 3.90*  --  3.87*   < > 4.67*  --  4.15* 2.72*  --  2.09* 1.75*  --   CALCIUM 7.5*  --  7.9*   < > 8.2*  --  7.9* 7.6*  --  8.1* 8.1*  --   MG 2.0  --  2.1  --  2.0  --   --  2.4  --   --  2.5*  --   PHOS 3.4  --  2.5  --  4.6  --  4.7* 3.1  --  3.9 3.6   --    < > = values in this interval not displayed.    Liver Function Tests: Recent Labs  Lab 05/19/2019 1200 05/21/2019 2127 05/19/19 0434 05/20/19 0637 05/21/19 0406 05/23/19 1618 05/24/19 0420 05/24/19 1609 05/25/19 0341  AST 130* 118* 64* 27 22  --   --   --   --   ALT 30 24 22 16 12  --   --   --   --   ALKPHOS 63 61 53 48 56  --   --   --   --   BILITOT 2.7* 1.8* 1.1 1.0 1.1  --   --   --   --   PROT 5.7* 5.3* 4.8* 4.8* 4.7*  --   --   --   --   ALBUMIN 2.7* 2.5* 2.2* 2.2* 2.2* 2.5* 2.5* 2.9* 3.0*   No results for input(s): LIPASE, AMYLASE in the last 168 hours. No results for input(s): AMMONIA in the last 168 hours.  CBC: Recent Labs  Lab 04/27/2019 2127  05/21/19 0406  05/22/19 0225  05/23/19 0259 05/23/19 0307 05/24/19 0420 05/24/19 0429 05/25/19 0341 05/25/19 0350  WBC 22.9*   < > 13.4*  --  10.3  --  17.6*  --  17.9*  --  19.0*  --   NEUTROABS 17.4*  --   --   --   --   --   --   --   --   --   --   --   HGB 11.7*   < > 7.4*   < > 9.0*   < > 8.8* 8.8* 7.6* 7.5* 9.9* 10.2*  HCT 33.3*   < > 22.4*   < > 28.3*   < > 26.6* 26.0* 23.2* 22.0* 30.6* 30.0*  MCV 87.4   < > 91.1  --  89.8  --  88.4  --  88.2  --  91.1  --   PLT 120*   < > 36*  --  30*  --  48*  --  103*  --  109*  --    < > = values in this interval not displayed.      Cardiac Enzymes: No results for input(s): CKTOTAL, CKMB, CKMBINDEX, TROPONINI in the last 168 hours.  BNP: BNP (last 3 results) Recent Labs    05/13/2019 1739 05/23/2019 1234  BNP 2,725.3* 3,370.0*    ProBNP (last 3 results) No results for input(s): PROBNP in the last 8760 hours.    Other results:  Imaging: Dg Chest Port 1 View  Result Date: 05/24/2019 CLINICAL DATA:  Endotracheal intubation. EXAM: PORTABLE CHEST 1 VIEW COMPARISON:  May 23, 2019. FINDINGS: The heart size and mediastinal contours are within normal limits. Endotracheal and nasogastric tubes are unchanged. Right-sided PICC line is unchanged. Stable left  internal jugular catheter. Stable Swan-Ganz catheter in the right internal jugular vein with tip directed toward right pulmonary artery. No pneumothorax or pleural effusion is noted. Stable left ventricular assist device is noted. No acute pulmonary disease is noted. The visualized skeletal structures are unremarkable. IMPRESSION: Stable support apparatus as described above. No acute cardiopulmonary abnormality seen. Electronically Signed   By: James  Green Jr M.D.   On: 05/24/2019 12:10   Dg Chest Port 1 View  Result Date: 05/23/2019 CLINICAL DATA:  Central line placement. EXAM: PORTABLE CHEST 1 VIEW COMPARISON:  Earlier film, same date. FINDINGS: The endotracheal tube, Swan-Ganz catheter, right PICC line, NG tube and Impella device are stable since the earlier film. New left IJ central venous catheter tip is near the brachiocephalic SVC junction. No complicating features. Improved aeration since the earlier film may suggest improving edema and atelectasis. IMPRESSION: 1. Support apparatus in good position without complicating features. 2. New left IJ catheter tip is in the mid SVC. 3. Improved lung aeration since earlier film today. Electronically Signed   By: P.  Gallerani M.D.   On: 05/23/2019 12:05     Medications:     Scheduled Medications: . sodium chloride   Intravenous Once  . arformoterol  15 mcg Nebulization BID  . aspirin  81 mg Per Tube Daily  . atorvastatin  80 mg Per Tube q1800  . B-complex with vitamin C  1 tablet Oral Daily  . budesonide (PULMICORT) nebulizer solution  0.5 mg Nebulization BID  . chlorhexidine gluconate (MEDLINE KIT)  15 mL Mouth Rinse BID  . Chlorhexidine Gluconate Cloth  6 each Topical Daily  . feeding supplement (PRO-STAT SUGAR FREE 64)  30 mL Per Tube TID  . insulin aspart  1-3 Units Subcutaneous Q4H  . ipratropium  0.5 mg Nebulization Q6H  . lidocaine-EPINEPHrine  20 mL Infiltration Once  . mouth rinse  15 mL Mouth Rinse 10 times per day  .  methylPREDNISolone (SOLU-MEDROL) injection  40 mg Intravenous Q12H  . pantoprazole (PROTONIX) IV  40 mg Intravenous QHS  . potassium chloride  20 mEq Oral Once  . sodium chloride flush  10-40 mL Intracatheter Q12H  . sodium chloride flush  3 mL Intravenous Q12H  . sodium chloride flush  3 mL Intravenous Q12H    Infusions: .  prismasol BGK 4/2.5 500 mL/hr at 05/25/19 0516  .  prismasol BGK 4/2.5 500 mL/hr at 05/25/19 0516  . sodium chloride 10 mL/hr at 05/20/19 0910  . sodium chloride Stopped (05/22/19 1301)  . sodium chloride 10 mL/hr at 05/25/19 0700  . sodium chloride    . amiodarone 30 mg/hr (05/25/19 0700)  . ceFEPime (MAXIPIME) IV Stopped (05/24/19 2145)  . feeding supplement (VITAL AF 1.2 CAL) 50 mL/hr at 05/25/19 0700  . fentaNYL infusion INTRAVENOUS 400 mcg/hr (05/25/19 0700)  . impella catheter heparin 50 unit/mL in dextrose   5% 50,000 Units (05/23/19 0815)  . midazolam 1 mg/hr (05/25/19 0700)  . milrinone 0.25 mcg/kg/min (05/25/19 0700)  . norepinephrine (LEVOPHED) Adult infusion 7 mcg/min (05/25/19 0700)  . prismasol BGK 4/2.5 2,000 mL/hr at 05/25/19 0520  . vancomycin Stopped (05/24/19 1808)    PRN Medications: sodium chloride, Place/Maintain arterial line **AND** sodium chloride, sodium chloride, docusate, fentaNYL, fentaNYL (SUBLIMAZE) injection, fentaNYL (SUBLIMAZE) injection, heparin, influenza vaccine adjuvanted, ipratropium-albuterol, midazolam, pneumococcal 23 valent vaccine, sodium chloride, sodium chloride flush, sodium chloride flush   Assessment/Plan:   1. Shock: Possible mixed cardiogenic/septic with fever and suspected PNA.  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  Echo with EF 20%, RV moderately HK.  Impella CP placed 10/23. Switched for Impella 5.0 on 10/24. No flow meter on Impella due to damage to sensor on insertion. Patient has LBBB.  On milrinone 0.25 and norepinephrine 7.  CI ok at 2.3, co-ox 77%. CVP down to 7-8 and PA  pressure lower. CVVH ongoing with UF 200 cc/hr currently. - Continue current milrinone 0.25 and wean norepinephrine.  - Continue CVVH, aim for CVP around 9-10 with Impella => will run CVVH to keep I/Os closer to even today with lower CVP.   - Continue Impella motor current stable this morning (sensor nonfunctional). LDH higher today at 911.  Haptoglobin low but from several days ago. Will assess position under echo again today (was ok yesterday) and have decreased speed to P7 for now due to concern for hemolysis.  Heparin is present in purge fluid but not systemically.  2. CAD:  NSTEMI, hs-TnI 16,000. 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  - continue ASA/statin. No b-blocker with shock - CABG with Impella 5.0 support likely best option when able 3. AKI: Baseline creatinine 1.3., now AKI likely due to ATN from shock and contrast (had CTA for PE at admission, only 25 cc contrast with cath). Now on CVVH, little UOP.  - As above, cut back UF rate with CVVH to run I/Os close to even.  4. Acute Respiratory Failure: Pulmonary edema + PNA left base. Respiratory status better on CVVH.  -  CXR today.  -  Appreciate CCM help with vent.   - He is on Solumedrol for possible component of bronchospasm.  5. ID: Suspected PNA, CXR with left base density.  Now afebrile, WBCs still 19. PCT 0.72 => 0.4. - Negative culture data so far.  - Covering with vancomycin/cefepime.   6. LBBB: Unsure chronicity.  7. Anemia: Hgb 9.9 after 1 unit PRBCs yesterday.   8. Thrombocytopenia: Platelets up to 109K today, HIT negative.  Got 1 unit plts 10/29 pre-line placement.  ?Low due to sepsis versus hemolysis.    9. PVCs/NSVT: Amiodarone gtt. Quiescent.  10. FEN: Tube feeds.   CRITICAL CARE Performed by: Loralie Champagne  Total critical care time: 45 minutes  Critical care time was exclusive of separately billable procedures and treating other patients.  Critical care was necessary to treat or  prevent imminent or life-threatening deterioration.  Critical care was time spent personally by me on the following activities: development of treatment plan with patient and/or surrogate as well as nursing, discussions with consultants, evaluation of patient's response to treatment, examination of patient, obtaining history from patient or surrogate, ordering and performing treatments and interventions, ordering and review of laboratory studies, ordering and review of radiographic studies, pulse oximetry and re-evaluation of patient's condition.    05/25/2019 8:00 AM  Impella position observed under echo, it is at about 3.1 cm, stable.  Will not adjust today.     05/25/2019 8:18 AM  

## 2019-05-25 NOTE — Progress Notes (Signed)
ANTICOAGULATION CONSULT NOTE - Follow up [place] for heparin Indication: Impella 5.0  No Known Allergies  Patient Measurements: Height: _0  (170.2 cm)(measured x3) Weight: 164 lb 3.9 oz (74.5 kg) IBW/kg (Calculated) : 66.1  Vital Signs: Temp: 97.9 F (36.6 C) (10/31 1015) Temp Source: Core (10/31 0800) BP: 121/81 (10/31 1000) Pulse Rate: 70 (10/31 0419)  Labs: Recent Labs    05/22/19 1700 05/23/19 0259  05/24/19 0420 05/24/19 0429 05/24/19 0814 05/24/19 1609 05/25/19 0341 05/25/19 0350  HGB  --  8.8*   < > 7.6* 7.5*  --   --  9.9* 10.2*  HCT  --  26.6*   < > 23.2* 22.0*  --   --  30.6* 30.0*  PLT  --  48*  --  103*  --   --   --  109*  --   APTT 78* 76*  --   --   --   --   --   --   --   HEPARINUNFRC  --   --   --   --   --  <0.10*  --  <0.10*  --   CREATININE  --  4.67*   < > 2.72*  --   --  2.09* 1.75*  --    < > = values in this interval not displayed.    Estimated Creatinine Clearance: 37.2 mL/min (A) (by C-G formula based on SCr of 1.75 mg/dL (H)).   Medical History: Past Medical History:  Diagnosis Date  . Acute respiratory failure with hypoxemia ([place]) 04/2019  . Carotid artery occlusion   . Hypertension     Medications:  Scheduled:  . sodium chloride   Intravenous Once  . arformoterol  15 mcg Nebulization BID  . aspirin  81 mg Per Tube Daily  . atorvastatin  80 mg Per Tube q1800  . [START ON 05/26/2019] B-complex with vitamin C  1 tablet Per Tube Daily  . budesonide (PULMICORT) nebulizer solution  0.5 mg Nebulization BID  . chlorhexidine gluconate (MEDLINE KIT)  15 mL Mouth Rinse BID  . Chlorhexidine Gluconate Cloth  6 each Topical Daily  . feeding supplement (PRO-STAT SUGAR FREE 64)  30 mL Per Tube TID  . insulin aspart  1-3 Units Subcutaneous Q4H  . ipratropium  0.5 mg Nebulization Q6H  . lidocaine-EPINEPHrine  20 mL Infiltration Once  . mouth rinse  15 mL Mouth Rinse 10 times per day  . pantoprazole (PROTONIX) IV  40 mg  Intravenous QHS  . sodium chloride flush  10-40 mL Intracatheter Q12H  . sodium chloride flush  3 mL Intravenous Q12H  . sodium chloride flush  3 mL Intravenous Q12H   Infusions:  .  prismasol BGK 4/2.5 500 mL/hr at 05/25/19 0516  .  prismasol BGK 4/2.5 500 mL/hr at 05/25/19 0516  . sodium chloride 10 mL/hr at 05/20/19 0910  . sodium chloride 10 mL/hr at 05/25/19 1100  . sodium chloride 10 mL/hr at 05/25/19 1100  . sodium chloride    . amiodarone 30 mg/hr (05/25/19 1100)  . ceFEPime (MAXIPIME) IV Stopped (05/25/19 1026)  . feeding supplement (VITAL AF 1.2 CAL) 1,000 mL (05/25/19 0952)  . fentaNYL infusion INTRAVENOUS Stopped (05/25/19 0848)  . impella catheter heparin 50 unit/mL in dextrose 5% 50,000 Units (05/23/19 0815)  . midazolam Stopped (05/25/19 0742)  . milrinone 0.25 mcg/kg/min (05/25/19 1104)  . norepinephrine (LEVOPHED) Adult infusion Stopped (05/25/19 1002)  . prismasol BGK 4/2.5 2,000 mL/hr at 05/25/19  0843    Assessment: 7 yoM admitted with shock and concern for ACS now s/p Impella CP placement in cath lab. Impella CP swapped out for 5.0 d/t hemolysis.   Heparin running in purge solution only with low pltc (HIT negative). Purge solution running at 10.6 ml/hr, providing 530 units/hr of heparin. ACT this AM is low at 109 and heparin level is undetectable.   No overt bleeding. Hgb improved to 10.2 s/p receiving 1 unit of PRBC yesterday. Pltc up 109  Goal of Therapy:  ACT 160-180 Monitor platelets by anticoagulation protocol: Yes   Plan:  Continue heparin in purge solution Start systemic heparin per Dr. Aundra Dubin Check ACT and titrate heparin rate per RN protocols Monitor daily heparin level, ACT, CBC Monitor for bleeding  Vertis Kelch, PharmD PGY2 Cardiology Pharmacy Resident Phone [phone] 05/25/2019       11:12 AM  Please check [email or web address] for unit-specific pharmacist phone numbers

## 2019-05-25 NOTE — Progress Notes (Signed)
ACT collected per pharmacy request and result reported to pharmacist.

## 2019-05-25 NOTE — Progress Notes (Signed)
  Echocardiogram 2D Echocardiogram has been performed.  Raymond Chavez 05/25/2019, 8:30 AM

## 2019-05-26 ENCOUNTER — Inpatient Hospital Stay (HOSPITAL_COMMUNITY): Payer: PPO

## 2019-05-26 DIAGNOSIS — Z95811 Presence of heart assist device: Secondary | ICD-10-CM

## 2019-05-26 LAB — COOXEMETRY PANEL
Carboxyhemoglobin: 2.5 % — ABNORMAL HIGH (ref 0.5–1.5)
Methemoglobin: 1.2 % (ref 0.0–1.5)
O2 Saturation: 82.1 %
Total hemoglobin: 8.2 g/dL — ABNORMAL LOW (ref 12.0–16.0)

## 2019-05-26 LAB — CBC
HCT: 25.4 % — ABNORMAL LOW (ref 39.0–52.0)
Hemoglobin: 8.2 g/dL — ABNORMAL LOW (ref 13.0–17.0)
MCH: 29.6 pg (ref 26.0–34.0)
MCHC: 32.3 g/dL (ref 30.0–36.0)
MCV: 91.7 fL (ref 80.0–100.0)
Platelets: 97 10*3/uL — ABNORMAL LOW (ref 150–400)
RBC: 2.77 MIL/uL — ABNORMAL LOW (ref 4.22–5.81)
RDW: 16.8 % — ABNORMAL HIGH (ref 11.5–15.5)
WBC: 17.9 10*3/uL — ABNORMAL HIGH (ref 4.0–10.5)
nRBC: 0.2 % (ref 0.0–0.2)

## 2019-05-26 LAB — POCT ACTIVATED CLOTTING TIME
Activated Clotting Time: 153 seconds
Activated Clotting Time: 158 seconds
Activated Clotting Time: 158 seconds
Activated Clotting Time: 169 seconds
Activated Clotting Time: 169 seconds
Activated Clotting Time: 180 seconds
Activated Clotting Time: 180 seconds
Activated Clotting Time: 186 seconds
Activated Clotting Time: 175 seconds
Activated Clotting Time: 175 seconds
Activated Clotting Time: 175 seconds

## 2019-05-26 LAB — GLUCOSE, CAPILLARY
Glucose-Capillary: 144 mg/dL — ABNORMAL HIGH (ref 70–99)
Glucose-Capillary: 116 mg/dL — ABNORMAL HIGH (ref 70–99)
Glucose-Capillary: 112 mg/dL — ABNORMAL HIGH (ref 70–99)
Glucose-Capillary: 159 mg/dL — ABNORMAL HIGH (ref 70–99)
Glucose-Capillary: 135 mg/dL — ABNORMAL HIGH (ref 70–99)
Glucose-Capillary: 117 mg/dL — ABNORMAL HIGH (ref 70–99)

## 2019-05-26 LAB — RENAL FUNCTION PANEL
Albumin: 2.5 g/dL — ABNORMAL LOW (ref 3.5–5.0)
Albumin: 2.4 g/dL — ABNORMAL LOW (ref 3.5–5.0)
Anion gap: 8 (ref 5–15)
Anion gap: 11 (ref 5–15)
BUN: 33 mg/dL — ABNORMAL HIGH (ref 8–23)
BUN: 40 mg/dL — ABNORMAL HIGH (ref 8–23)
CO2: 24 mmol/L (ref 22–32)
CO2: 21 mmol/L — ABNORMAL LOW (ref 22–32)
Calcium: 7.7 mg/dL — ABNORMAL LOW (ref 8.9–10.3)
Calcium: 8.1 mg/dL — ABNORMAL LOW (ref 8.9–10.3)
Chloride: 104 mmol/L (ref 98–111)
Chloride: 103 mmol/L (ref 98–111)
Creatinine, Ser: 1.45 mg/dL — ABNORMAL HIGH (ref 0.61–1.24)
Creatinine, Ser: 1.36 mg/dL — ABNORMAL HIGH (ref 0.61–1.24)
GFR calc Af Amer: 57 mL/min — ABNORMAL LOW (ref >60)
GFR calc Af Amer: >60 mL/min (ref >60)
GFR calc non Af Amer: 49 mL/min — ABNORMAL LOW (ref >60)
GFR calc non Af Amer: 53 mL/min — ABNORMAL LOW (ref >60)
Glucose, Bld: 119 mg/dL — ABNORMAL HIGH (ref 70–99)
Glucose, Bld: 125 mg/dL — ABNORMAL HIGH (ref 70–99)
Phosphorus: 1.5 mg/dL — ABNORMAL LOW (ref 2.5–4.6)
Phosphorus: 1.9 mg/dL — ABNORMAL LOW (ref 2.5–4.6)
Potassium: 4 mmol/L (ref 3.5–5.1)
Potassium: 4.1 mmol/L (ref 3.5–5.1)
Sodium: 136 mmol/L (ref 135–145)
Sodium: 135 mmol/L (ref 135–145)

## 2019-05-26 LAB — POCT I-STAT 7, (LYTES, BLD GAS, ICA,H+H)
Bicarbonate: 21.1 mmol/L (ref 20.0–28.0)
Calcium, Ion: 1.1 mmol/L — ABNORMAL LOW (ref 1.15–1.40)
HCT: 23 % — ABNORMAL LOW (ref 39.0–52.0)
Hemoglobin: 7.8 g/dL — ABNORMAL LOW (ref 13.0–17.0)
O2 Saturation: 90 %
Patient temperature: 36.7
Potassium: 4.1 mmol/L (ref 3.5–5.1)
Sodium: 135 mmol/L (ref 135–145)
TCO2: 22 mmol/L (ref 22–32)
pCO2 arterial: 22.8 mmHg — ABNORMAL LOW (ref 32.0–48.0)
pH, Arterial: 7.574 — ABNORMAL HIGH (ref 7.350–7.450)
pO2, Arterial: 46 mmHg — ABNORMAL LOW (ref 83.0–108.0)

## 2019-05-26 LAB — ECHOCARDIOGRAM LIMITED
Height: 67 in
Weight: 2532.64 oz

## 2019-05-26 LAB — LACTATE DEHYDROGENASE: LDH: 593 U/L — ABNORMAL HIGH (ref 98–192)

## 2019-05-26 LAB — HEPARIN LEVEL (UNFRACTIONATED): Heparin Unfractionated: 0.96 IU/mL — ABNORMAL HIGH (ref 0.30–0.70)

## 2019-05-26 LAB — MAGNESIUM: Magnesium: 2.4 mg/dL (ref 1.7–2.4)

## 2019-05-26 MED ORDER — SODIUM PHOSPHATES 45 MMOLE/15ML IV SOLN
30.0000 mmol | Freq: Once | INTRAVENOUS | Status: AC
  Administered 2019-05-26: 30 mmol via INTRAVENOUS
  Filled 2019-05-26: qty 10

## 2019-05-26 MED ORDER — ORAL CARE MOUTH RINSE
15.0000 mL | Freq: Two times a day (BID) | OROMUCOSAL | Status: DC
  Administered 2019-05-27 – 2019-05-28 (×4): 15 mL via OROMUCOSAL

## 2019-05-26 MED ORDER — DEXMEDETOMIDINE HCL IN NACL 400 MCG/100ML IV SOLN
0.4000 ug/kg/h | INTRAVENOUS | Status: DC
  Administered 2019-05-26: 0.4 ug/kg/h via INTRAVENOUS
  Filled 2019-05-26: qty 100

## 2019-05-26 MED ORDER — SODIUM PHOSPHATES 45 MMOLE/15ML IV SOLN: 20.0000 mmol | Freq: Once | INTRAVENOUS | Status: DC

## 2019-05-26 MED ORDER — IPRATROPIUM BROMIDE 0.02 % IN SOLN
0.5000 mg | Freq: Two times a day (BID) | RESPIRATORY_TRACT | Status: DC
  Administered 2019-05-26 – 2019-05-30 (×9): 0.5 mg via RESPIRATORY_TRACT
  Filled 2019-05-26 (×10): qty 2.5

## 2019-05-26 MED ORDER — CHLORHEXIDINE GLUCONATE 0.12 % MT SOLN
15.0000 mL | Freq: Two times a day (BID) | OROMUCOSAL | Status: DC
  Administered 2019-05-26 – 2019-05-30 (×6): 15 mL via OROMUCOSAL
  Filled 2019-05-26 (×4): qty 15

## 2019-05-26 MED ORDER — SODIUM CHLORIDE 0.9 % IV SOLN
2.0000 g | Freq: Three times a day (TID) | INTRAVENOUS | Status: DC
  Administered 2019-05-26 – 2019-05-29 (×9): 2 g via INTRAVENOUS
  Filled 2019-05-26 (×6): qty 2
  Filled 2019-05-26: qty 2000
  Filled 2019-05-26: qty 2
  Filled 2019-05-26: qty 2000
  Filled 2019-05-26 (×2): qty 2

## 2019-05-26 NOTE — Procedures (Signed)
Extubation Procedure Note  Patient Details:   Name: Raymond Chavez DOB: May 01, 1951 MRN: [id number]   Airway Documentation:    Vent end date: (not recorded) Vent end time: (not recorded)   Evaluation  O2 sats: stable throughout Complications: No apparent complications Patient did tolerate procedure well. Bilateral Breath Sounds: Diminished, Clear   Yes   RT extubated patient to 4L [place] per MD order with RN at bedside. Positive cuff leak noted. Patient tolerated well and able to speak. No stridor noted. Patient sating 100% on 4L [place]. RN will work with patient on IS. RT will continue to monitor as needed.   Vernona Rieger 05/26/2019, 11:29 AM

## 2019-05-26 NOTE — Progress Notes (Signed)
NAME:  Quantay Zaremba, MRN:  [id number], DOB:  [birth year], LOS: 37 ADMISSION DATE:  05/19/2019, CONSULTATION DATE:  05/01/2019 REFERRING MD:  Billy Fischer  CHIEF COMPLAINT:  SOB   Brief History   Elijiah Mickley is a 68 y.o. male who was admitted with acute hypoxic respiratory failure due to acute pulmonary edema in setting of NSTEMI, cardiogenic shock, possible PNA, He required intubation in the ED after failing BiPAP. On impella for hemodynamic support CVTS and cardiology are discussing about CABG, LVAD  Past Medical History  HTN, CAD.  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  10/27-heparin stopped due to low platelets.  HIT panel sent.  Starting bivalirudin.  Off Levophed.  On minimal vent support but failed weaning trials 10/28 Bronchoscopy  10/29 Started on CRRT 10/30 Bronchoscopy with mucous plug removal  Consults:  Cardiology PCCM Nephrology   Procedures:  ETT 10/22 >  [place]wan 10/22 > Impella 10/23 >> 10/24 Changed to 5.0 via right axillary >>  Vas cath 10/29   Significant Diagnostic Tests:  CTA chest 10/22 > no PE, b/l basilar consolidation, diffuse interlobular septal thickening with GGO's, b/l hilar adenopathy. Echo 10/23 > severely decreased LV function.  Cannot estimate EF  Micro Data:  Blood 10/22, 10/24, 10/28, 10/29 >>> NGTD Sputum 10/22 >>>  Urine 10/22 >>> Urine Strep > Negative  RVP 10/22 > Neg MRSA PCR > Neg SARS CoV2 10/22 > neg. BAL 10/28 > Enterococcus faecalis  Antimicrobials:  Vanc 10/22 > 10/25 Cefepime 10/22 >    Interim history/subjective:  Weaned off sedation and tolerated PS for a few hours yesterday however held off on extubation due to inability to follow commands. Had episode of coughing and respiratory distress mid-day requiring increased sedation and returned to  full vent support. This morning, CVP 7. Patient drowsy and not following commands. BAL cultures returned + Enterococcus faecalis so Vanc was restarted.  Objective:  Blood pressure 104/79, pulse 80, temperature 97.7 F (36.5 C), resp. rate 18, height _0  (1.702 m), weight 71.8 kg, SpO2 100 %. PAP: (30-71)/(21-46) 45/28 CVP:  [3 mmHg-21 mmHg] 9 mmHg CO:  [4.2 L/min-5 L/min] 4.9 L/min CI:  [2.3 L/min/m2-2.7 L/min/m2] 2.6 L/min/m2  Vent Mode: PRVC FiO2 (%):  [30 %] 30 % Set Rate:  [18 bmp] 18 bmp Vt Set:  [550 mL] 550 mL PEEP:  [10 cmH20] 10 cmH20 Pressure Support:  [10 cmH20] 10 cmH20 Plateau Pressure:  [17 cmH20-21 cmH20] 20 cmH20   Intake/Output Summary (Last 24 hours) at 05/26/2019 0719 Last data filed at 05/26/2019 0700 Gross per 24 hour  Intake 3968.66 ml  Output 3111 ml  Net 857.66 ml   Filed Weights   05/24/19 0500 05/25/19 0645 05/26/19 0115  Weight: 74.9 kg 74.5 kg 71.8 kg   Physical Exam: General: Chronically ill-appearing, drowsy HENT: [place], AT, ETT in place Eyes: EOMI, no scleral icterus Respiratory: Clear to auscultation bilaterally.  No crackles, wheezing or rales Cardiovascular: Continuous hum GI: BS+, soft, nontender Extremities:-Edema,-tenderness Neuro: Drowsy, not following commands GU: Foley in place  Assessment & Plan:   Acute hypoxic respiratory failure requiring intubation - multifactorial due to LLL PNA, acute pulmonary edema in setting of cardiogenic shock  Continues to have intermittently high peak pressures possibly secondary to bronchospasm S/p bronchoscopy 10/28 Increased I:E ratio and TV  to 8cc without improvement in double triggering. Reduced TV 450cc due to elevated pressures. Will resume sedation. Plan  Scheduled brovana, pulmicort, atrovent S/p steroids PS as tolerated Will start Precedex to facilitate titration off sedation VAP CRRT for volume removal  Cardiogenic Shock in setting of severe 3V Disease with depressed LV Dysfunction   H/O MI 1998, CAD  EF 15-20% with decreased RV systolic function  00/86 RHC/LHC which showed severe 3V CAD (LM 75%, LAD 100%, LCX 80%, RCA 80% prox) and severely depressed LV function Plan Cardiology/CT Surgery Following >> ??LVAD vs CABG  Continue milrinone, amiodarone Continue Impella Continue levophed for MAP goal >65 Continue preadmission ASA Hold preadmission amlodipine, atorvastatin, HCTZ, lisinopril CRRT for volume removeal  Co-comitant Septic Shock secondary to Enterococcus Faecalis PNA Continue pressor support as above  Restarted Vanc yesterday Continue Cefepime for five days Follow-up final BAL and blood cultures and sensitivities. De-escalate antibiotics when able.  Acute Kidney Injury  Plan Nephrology following CRRT for volume removal  Thrombocytopenia HIT panel negative Plan Heparin gtt  Hypophosphatemia Plan Replete  Best Practice:  Diet: Tube feeds Pain/Anxiety/Delirium protocol: Fentanyl gtt / Versed gtt and PRN.  RASS goal 0 to -1. VAP protocol: Yes.  DVT prophylaxis: SCD's /bivalirudin GI prophylaxis: PPI. Glucose control: SSI. Mobility: Bedrest. Code Status: Full. Family Communication: Updated per primary team. CCM last updated daughter on 10/31 Disposition: ICU.  Labs   CBC: Recent Labs  Lab 05/22/19 0225  05/23/19 0259  05/24/19 0420 05/24/19 0429 05/25/19 0341 05/25/19 0350 05/26/19 0250  WBC 10.3  --  17.6*  --  17.9*  --  19.0*  --  17.9*  HGB 9.0*   < > 8.8*   < > 7.6* 7.5* 9.9* 10.2* 8.2*  HCT 28.3*   < > 26.6*   < > 23.2* 22.0* 30.6* 30.0* 25.4*  MCV 89.8  --  88.4  --  88.2  --  91.1  --  91.7  PLT 30*  --  48*  --  103*  --  109*  --  97*   < > = values in this interval not displayed.   Basic Metabolic Panel: Recent Labs  Lab 05/21/19 0406  05/23/19 0259  05/24/19 0420  05/24/19 1609 05/25/19 0341 05/25/19 0350 05/25/19 1641 05/26/19 0250  NA 136   < > 135   < > 134*   < > 135 136 134* 136 136  K 3.3*   < > 4.5   <  > 4.3   < > 4.8 4.9 4.8 4.3 4.0  CL 106   < > 102   < > 100  --  100 101  --  102 104  CO2 20*   < > 20*   < > 22  --  24 23  --  23 24  GLUCOSE 133*   < > 175*   < > 179*  --  160* 193*  --  116* 119*  BUN 58*   < > 72*   < > 47*  --  38* 33*  --  41* 33*  CREATININE 3.87*   < > 4.67*   < > 2.72*  --  2.09* 1.75*  --  1.67* 1.45*  CALCIUM 7.9*   < > 8.2*   < > 7.6*  --  8.1* 8.1*  --  8.1* 7.7*  MG 2.1  --  2.0  --  2.4  --   --  2.5*  --   --  2.4  PHOS 2.5  --  4.6   < > 3.1  --  3.9 3.6  --  2.0* 1.5*   < > = values in this interval not displayed.   GFR: Estimated Creatinine Clearance: 45 mL/min (A) (by C-G formula based on SCr of 1.45 mg/dL (H)). Recent Labs  Lab 05/21/19 1045  05/23/19 0259 05/23/19 1217 05/24/19 0420 05/25/19 0341 05/26/19 0250  PROCALCITON 0.98  --   --  0.72 0.40 0.45  --   WBC  --    < > 17.6*  --  17.9* 19.0* 17.9*   < > = values in this interval not displayed.   Liver Function Tests: Recent Labs  Lab 05/20/19 2831 05/21/19 0406  05/24/19 0420 05/24/19 1609 05/25/19 0341 05/25/19 1641 05/26/19 0250  AST 27 22  --   --   --   --   --   --   ALT 16 12  --   --   --   --   --   --   ALKPHOS 48 56  --   --   --   --   --   --   BILITOT 1.0 1.1  --   --   --   --   --   --   PROT 4.8* 4.7*  --   --   --   --   --   --   ALBUMIN 2.2* 2.2*   < > 2.5* 2.9* 3.0* 2.8* 2.5*   < > = values in this interval not displayed.   No results for input(s): LIPASE, AMYLASE in the last 168 hours. No results for input(s): AMMONIA in the last 168 hours. ABG    Component Value Date/Time   PHART 7.299 (L) 05/25/2019 0350   PCO2ART 54.4 (H) 05/25/2019 0350   PO2ART 250.0 (H) 05/25/2019 0350   HCO3 27.1 05/25/2019 0350   TCO2 29 05/25/2019 0350   ACIDBASEDEF 5.3 (H) 05/23/2019 0335   O2SAT 82.1 05/26/2019 0310    Coagulation Profile: No results for input(s): INR, PROTIME in the last 168 hours. Cardiac Enzymes: No results for input(s): CKTOTAL, CKMB,  CKMBINDEX, TROPONINI in the last 168 hours. HbA1C: Hgb A1c MFr Bld  Date/Time Value Ref Range Status  05/14/2019 11:25 PM 5.5 4.8 - 5.6 % Final    Comment:    (NOTE) Pre diabetes:          5.7%-6.4% Diabetes:              >6.4% Glycemic control for   <7.0% adults with diabetes    CBG: Recent Labs  Lab 05/25/19 1313 05/25/19 1537 05/25/19 1932 05/25/19 2357 05/26/19 0259  GLUCAP 174* 98 134* 144* 116*    Critical care time: 48 min   The patient is critically ill with multiple organ systems failure and requires high complexity decision making for assessment and support, frequent evaluation and titration of therapies, application of advanced monitoring technologies and extensive interpretation of multiple databases.   Critical Care Time devoted to patient care services described in this note is 48 Minutes.   Rodman Pickle, M.D. [facility] Pulmonary/Critical Care Medicine 05/26/2019 7:19 AM  Pager: [phone] After hours pager: [phone]

## 2019-05-26 NOTE — Progress Notes (Signed)
ANTICOAGULATION CONSULT NOTE - Follow up [place] for heparin Indication: Impella 5.0  No Known Allergies  Patient Measurements: Height: '5\' 7"'$  (170.2 cm)(measured x3) Weight: 158 lb 4.6 oz (71.8 kg) IBW/kg (Calculated) : 66.1  Vital Signs: Temp: 97.7 F (36.5 C) (11/01 0700) BP: 104/79 (11/01 0600)  Labs: Recent Labs    05/24/19 0420  05/24/19 0814  05/25/19 0341 05/25/19 0350 05/25/19 1641 05/26/19 0250  HGB 7.6*   < >  --   --  9.9* 10.2*  --  8.2*  HCT 23.2*   < >  --   --  30.6* 30.0*  --  25.4*  PLT 103*  --   --   --  109*  --   --  97*  HEPARINUNFRC  --   --  <0.10*  --  <0.10*  --   --  0.96*  CREATININE 2.72*  --   --    < > 1.75*  --  1.67* 1.45*   < > = values in this interval not displayed.    Estimated Creatinine Clearance: 45 mL/min (A) (by C-G formula based on SCr of 1.45 mg/dL (H)).   Medical History: Past Medical History:  Diagnosis Date  . Acute respiratory failure with hypoxemia ([place]) 04/2019  . Carotid artery occlusion   . Hypertension     Medications:  Scheduled:  . sodium chloride   Intravenous Once  . arformoterol  15 mcg Nebulization BID  . aspirin  81 mg Per Tube Daily  . atorvastatin  80 mg Per Tube q1800  . B-complex with vitamin C  1 tablet Per Tube Daily  . budesonide (PULMICORT) nebulizer solution  0.5 mg Nebulization BID  . chlorhexidine gluconate (MEDLINE KIT)  15 mL Mouth Rinse BID  . Chlorhexidine Gluconate Cloth  6 each Topical Daily  . feeding supplement (PRO-STAT SUGAR FREE 64)  30 mL Per Tube TID  . insulin aspart  1-3 Units Subcutaneous Q4H  . ipratropium  0.5 mg Nebulization Q6H  . lidocaine-EPINEPHrine  20 mL Infiltration Once  . mouth rinse  15 mL Mouth Rinse 10 times per day  . pantoprazole (PROTONIX) IV  40 mg Intravenous QHS  . sodium chloride flush  10-40 mL Intracatheter Q12H  . sodium chloride flush  3 mL Intravenous Q12H  . sodium chloride flush  3 mL Intravenous Q12H   Infusions:  .   prismasol BGK 4/2.5 500 mL/hr at 05/26/19 0311  .  prismasol BGK 4/2.5 500 mL/hr at 05/26/19 0310  . sodium chloride 10 mL/hr at 05/20/19 0910  . sodium chloride 10 mL/hr at 05/26/19 0800  . sodium chloride Stopped (05/25/19 1844)  . sodium chloride    . amiodarone 30 mg/hr (05/26/19 0800)  . ceFEPime (MAXIPIME) IV Stopped (05/25/19 2139)  . dexmedetomidine (PRECEDEX) IV infusion 0.4 mcg/kg/hr (05/26/19 0818)  . feeding supplement (VITAL AF 1.2 CAL) 50 mL/hr at 05/26/19 0600  . fentaNYL infusion INTRAVENOUS 50 mcg/hr (05/26/19 0800)  . impella catheter heparin 50 unit/mL in dextrose 5% 50,000 Units (05/23/19 0815)  . heparin 1,400 Units/hr (05/26/19 0819)  . milrinone 0.25 mcg/kg/min (05/26/19 0800)  . norepinephrine (LEVOPHED) Adult infusion 3 mcg/min (05/26/19 0800)  . prismasol BGK 4/2.5 2,000 mL/hr at 05/26/19 0725  . sodium phosphate  Dextrose 5% IVPB 30 mmol (05/26/19 0813)  . vancomycin Stopped (05/25/19 1810)    Assessment: 82 yoM admitted with shock and concern for ACS now s/p Impella CP placement in cath lab. Impella CP  swapped out for 5.0 d/t hemolysis.   Purge solution running at 10 ml/hr, providing 500 units/hr of heparin. Systemic heparin running at 14 ml/hr, providing 1400 units/hr of heparin. ACT this AM is within goal 180. Heparin level is 0.96 but will not adjust since dosing based on ACTs.  No overt bleeding. Hgb down to 8.2, pltc stable 97.  Goal of Therapy:  ACT 160-180 Monitor platelets by anticoagulation protocol: Yes   Plan:  Continue heparin in purge solution and systemic heparin Check ACT and titrate heparin rate per RN protocols Monitor daily heparin level, ACT, CBC Monitor for bleeding  Vertis Kelch, PharmD PGY2 Cardiology Pharmacy Resident Phone [phone] 05/26/2019       8:20 AM  Please check [email or web address] for unit-specific pharmacist phone numbers

## 2019-05-26 NOTE — Progress Notes (Addendum)
Patient ID: Raymond Chavez, male   DOB: Nov 28, 1950, 68 y.o.   MRN: [id number]    Advanced Heart Failure Rounding Note   Subjective:    Underwent placement of Impella 5.0 on 10/24 with removal of R femoral Impella CP.   CVVH started on 10/29.  Currently running UF to keep I/Os even.  Minimal urine production, foley now out. Weight coming down towards his baseline.  CVP 9-10 today.  Bronchoscopy 10/30 for mucus plugging.   Enterococcus faecalis on BAL culture.  Afebrile, WBCs 17.9 today, patient is on cefepime and vancomycin.   Norepinephrine 5 mcg + milrinone 0.25.    Plts 97, HIT negative.  Getting heparin gtt.   He will wake up and follow commands.   Swan #s  PA 49/25 CVP 9-10 CI 2.6 Co-ox 81%  Impella P-7 No flows due to damage of sensor during placement. Motor current stable. LDH 881 => 911 => 593.   RHC/LHC (10/23):  Coronary Findings  Diagnostic Dominance: Right Left Main  75% distal left main stenosis.  Left Anterior Descending  Occluded proximally, some late filling by collaterals.  Ramus Intermedius  Large vessel, patent with luminal irregularities.  Left Circumflex  80% mid LCx stenosis. Small OM1 with 95% proximal stenosis. Large OM2 is subtotally occluded (fills late with TIMI 2 flow).  Right Coronary Artery  80% proximal stenosis. Serial 50% mid-vessel stenoses. PDA with only luminal irregularities. Large PLV with 80% mid-vessel stenosis.  Intervention  No interventions have been documented. Right Heart  Right Heart Pressures RHC Procedural Findings (mmHg): Hemodynamics RA mean 13 RV 70/17 PA 73/41, mean 53 PCWP mean 30 LV 140/40 AO 138/88  Oxygen saturations: PA 60% AO 97%  Cardiac Output (Fick) 2.95  Cardiac Index (Fick) 1.62 PVR 7.8 WU  Cardiac Output (Thermo) 3.24 Cardiac Index (Thermo) 1.78  PVR 7.1 WU  PAPI 2.46    Objective:   Weight Range:  Vital Signs:   Temp:  [96.1 F (35.6 C)-100.2 F (37.9 C)] 97.7 F (36.5 C)  (11/01 0700) Pulse Rate:  [80-94] 80 (10/31 1741) Resp:  [12-31] 18 (11/01 0700) BP: (86-123)/(61-90) 104/79 (11/01 0600) SpO2:  [96 %-100 %] 100 % (11/01 0700) Arterial Line BP: (84-154)/(47-92) 110/71 (11/01 0700) FiO2 (%):  [30 %] 30 % (11/01 0645) Weight:  [71.8 kg] 71.8 kg (11/01 0115) Last BM Date: 05/20/19  Weight change: Filed Weights   05/24/19 0500 05/25/19 0645 05/26/19 0115  Weight: 74.9 kg 74.5 kg 71.8 kg    Intake/Output:   Intake/Output Summary (Last 24 hours) at 05/26/2019 0743 Last data filed at 05/26/2019 0700 Gross per 24 hour  Intake 3968.66 ml  Output 3111 ml  Net 857.66 ml     Physical Exam: CVP 9-10  General: Intubated, awake.  Neck: No JVD, no thyromegaly or thyroid nodule.  Lungs: Decreased at bases.  CV: Nondisplaced PMI.  Heart regular S1/S2, no S3/S4, no murmur. Impella sounds. No peripheral edema.  Abdomen: Soft, nontender, no hepatosplenomegaly, no distention.  Skin: Intact without lesions or rashes.  Neurologic: Intermittently following commands.   Extremities: No clubbing or cyanosis.  HEENT: Normal.   Telemetry: NSR 90s (personally reviewed)  Labs: Basic Metabolic Panel: Recent Labs  Lab 05/21/19 0406  05/23/19 0259  05/24/19 0420  05/24/19 1609 05/25/19 0341 05/25/19 0350 05/25/19 1641 05/26/19 0250  NA 136   < > 135   < > 134*   < > 135 136 134* 136 136  K 3.3*   < > 4.5   < >  4.3   < > 4.8 4.9 4.8 4.3 4.0  CL 106   < > 102   < > 100  --  100 101  --  102 104  CO2 20*   < > 20*   < > 22  --  24 23  --  23 24  GLUCOSE 133*   < > 175*   < > 179*  --  160* 193*  --  116* 119*  BUN 58*   < > 72*   < > 47*  --  38* 33*  --  41* 33*  CREATININE 3.87*   < > 4.67*   < > 2.72*  --  2.09* 1.75*  --  1.67* 1.45*  CALCIUM 7.9*   < > 8.2*   < > 7.6*  --  8.1* 8.1*  --  8.1* 7.7*  MG 2.1  --  2.0  --  2.4  --   --  2.5*  --   --  2.4  PHOS 2.5  --  4.6   < > 3.1  --  3.9 3.6  --  2.0* 1.5*   < > = values in this interval not displayed.     Liver Function Tests: Recent Labs  Lab 05/20/19 6283 05/21/19 0406  05/24/19 0420 05/24/19 1609 05/25/19 0341 05/25/19 1641 05/26/19 0250  AST 27 22  --   --   --   --   --   --   ALT 16 12  --   --   --   --   --   --   ALKPHOS 48 56  --   --   --   --   --   --   BILITOT 1.0 1.1  --   --   --   --   --   --   PROT 4.8* 4.7*  --   --   --   --   --   --   ALBUMIN 2.2* 2.2*   < > 2.5* 2.9* 3.0* 2.8* 2.5*   < > = values in this interval not displayed.   No results for input(s): LIPASE, AMYLASE in the last 168 hours. No results for input(s): AMMONIA in the last 168 hours.  CBC: Recent Labs  Lab 05/22/19 0225  05/23/19 0259  05/24/19 0420 05/24/19 0429 05/25/19 0341 05/25/19 0350 05/26/19 0250  WBC 10.3  --  17.6*  --  17.9*  --  19.0*  --  17.9*  HGB 9.0*   < > 8.8*   < > 7.6* 7.5* 9.9* 10.2* 8.2*  HCT 28.3*   < > 26.6*   < > 23.2* 22.0* 30.6* 30.0* 25.4*  MCV 89.8  --  88.4  --  88.2  --  91.1  --  91.7  PLT 30*  --  48*  --  103*  --  109*  --  97*   < > = values in this interval not displayed.    Cardiac Enzymes: No results for input(s): CKTOTAL, CKMB, CKMBINDEX, TROPONINI in the last 168 hours.  BNP: BNP (last 3 results) Recent Labs    05/25/2019 1739 05/24/2019 1234  BNP 2,725.3* 3,370.0*    ProBNP (last 3 results) No results for input(s): PROBNP in the last 8760 hours.    Other results:  Imaging: Dg Chest Port 1 View  Result Date: 05/25/2019 CLINICAL DATA:  Follow-up exam.  CHF. EXAM: PORTABLE CHEST 1 VIEW COMPARISON:  05/24/2019 and older  exams. FINDINGS: Bilateral interstitial thickening is similar to the previous day's study. No new lung abnormalities. No areas of consolidation. No convincing pleural effusion and no pneumothorax. Endotracheal tube, nasal/orogastric tube, right internal jugular Swan-Ganz catheter, left internal jugular central venous line and right PICC are stable. Stable left ventricular assist device. IMPRESSION: 1. Bilateral  interstitial thickening consistent with mild residual interstitial edema. This is improved compared to 05/23/2019. No new lung abnormalities. 2. Stable support apparatus. Electronically Signed   By: Lajean Manes M.D.   On: 05/25/2019 10:34   Dg Chest Port 1 View  Result Date: 05/24/2019 CLINICAL DATA:  Endotracheal intubation. EXAM: PORTABLE CHEST 1 VIEW COMPARISON:  May 23, 2019. FINDINGS: The heart size and mediastinal contours are within normal limits. Endotracheal and nasogastric tubes are unchanged. Right-sided PICC line is unchanged. Stable left internal jugular catheter. Stable Swan-Ganz catheter in the right internal jugular vein with tip directed toward right pulmonary artery. No pneumothorax or pleural effusion is noted. Stable left ventricular assist device is noted. No acute pulmonary disease is noted. The visualized skeletal structures are unremarkable. IMPRESSION: Stable support apparatus as described above. No acute cardiopulmonary abnormality seen. Electronically Signed   By: Marijo Conception M.D.   On: 05/24/2019 12:10     Medications:     Scheduled Medications: . sodium chloride   Intravenous Once  . arformoterol  15 mcg Nebulization BID  . aspirin  81 mg Per Tube Daily  . atorvastatin  80 mg Per Tube q1800  . B-complex with vitamin C  1 tablet Per Tube Daily  . budesonide (PULMICORT) nebulizer solution  0.5 mg Nebulization BID  . chlorhexidine gluconate (MEDLINE KIT)  15 mL Mouth Rinse BID  . Chlorhexidine Gluconate Cloth  6 each Topical Daily  . feeding supplement (PRO-STAT SUGAR FREE 64)  30 mL Per Tube TID  . insulin aspart  1-3 Units Subcutaneous Q4H  . ipratropium  0.5 mg Nebulization Q6H  . lidocaine-EPINEPHrine  20 mL Infiltration Once  . mouth rinse  15 mL Mouth Rinse 10 times per day  . pantoprazole (PROTONIX) IV  40 mg Intravenous QHS  . sodium chloride flush  10-40 mL Intracatheter Q12H  . sodium chloride flush  3 mL Intravenous Q12H  . sodium chloride  flush  3 mL Intravenous Q12H    Infusions: .  prismasol BGK 4/2.5 500 mL/hr at 05/26/19 0311  .  prismasol BGK 4/2.5 500 mL/hr at 05/26/19 0310  . sodium chloride 10 mL/hr at 05/20/19 0910  . sodium chloride 10 mL/hr at 05/26/19 0700  . sodium chloride Stopped (05/25/19 1844)  . sodium chloride    . amiodarone 30 mg/hr (05/26/19 0700)  . ceFEPime (MAXIPIME) IV Stopped (05/25/19 2139)  . dexmedetomidine (PRECEDEX) IV infusion    . feeding supplement (VITAL AF 1.2 CAL) 50 mL/hr at 05/26/19 0600  . fentaNYL infusion INTRAVENOUS 300 mcg/hr (05/26/19 0700)  . impella catheter heparin 50 unit/mL in dextrose 5% 50,000 Units (05/23/19 0815)  . heparin 1,400 Units/hr (05/26/19 0700)  . milrinone 0.25 mcg/kg/min (05/26/19 0700)  . norepinephrine (LEVOPHED) Adult infusion 7 mcg/min (05/26/19 0700)  . prismasol BGK 4/2.5 2,000 mL/hr at 05/26/19 0725  . sodium phosphate  Dextrose 5% IVPB    . vancomycin Stopped (05/25/19 1810)    PRN Medications: sodium chloride, Place/Maintain arterial line **AND** sodium chloride, sodium chloride, docusate, fentaNYL, fentaNYL (SUBLIMAZE) injection, fentaNYL (SUBLIMAZE) injection, heparin, influenza vaccine adjuvanted, ipratropium-albuterol, midazolam, pneumococcal 23 valent vaccine, sodium chloride, sodium chloride flush, sodium  chloride flush   Assessment/Plan:   1. Shock: Possible mixed cardiogenic/septic with fever and suspected PNA.  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  Echo with EF 20%, RV moderately HK.  Impella CP placed 10/23. Switched for Impella 5.0 on 10/24. No flow meter on Impella due to damage to sensor on insertion. Patient has LBBB.  On milrinone 0.25 and norepinephrine 5.  CI ok at 2.6, co-ox 81%. CVP 9-10. CVVH ongoing, aiming to keep I/Os even.  - Continue current milrinone 0.25 and wean norepinephrine today.  - Continue CVVH, aim for CVP around 9-10 with Impella (at goal currently) => will run CVVH to keep  I/Os even today.   - Continue Impella motor current stable this morning (sensor nonfunctional). LDH lower today at 593. Will assess position under echo again today (was ok yesterday) and have decreased speed to P7 for now due to concern for hemolysis.  He is getting systemic heparin.  2. CAD:  NSTEMI, hs-TnI 16,000. 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  - continue ASA/statin. No b-blocker with shock - CABG with Impella 5.0 support likely best option when able 3. AKI: Baseline creatinine 1.3., now AKI likely due to ATN from shock and contrast (had CTA for PE at admission, only 25 cc contrast with cath). Now on CVVH, little UOP.  -Keep UF rate with CVVH to run I/Os close to even.  4. Acute Respiratory Failure: Pulmonary edema + PNA left base. Enterococcus faecalis in BAL cultures.  -  Appreciate CCM help with vent.   5. ID: Suspected PNA, CXR with left base density. Enterococcus faecalis in BAL cultures.  Now afebrile, WBCs 17.9.  - Covering with vancomycin/cefepime.   6. LBBB: Unsure chronicity.  7. Anemia: Hgb trending down, 8.2.  Will transfuse hgb < 8.    8. Thrombocytopenia: Platelets 97K today, HIT negative.  Got 1 unit plts 10/29 pre-line placement.  ?Low due to sepsis versus hemolysis.    9. PVCs/NSVT: Amiodarone gtt. Quiescent.  10. FEN: Tube feeds.   CRITICAL CARE Performed by: Loralie Champagne  Total critical care time: 45 minutes  Critical care time was exclusive of separately billable procedures and treating other patients.  Critical care was necessary to treat or prevent imminent or life-threatening deterioration.  Critical care was time spent personally by me on the following activities: development of treatment plan with patient and/or surrogate as well as nursing, discussions with consultants, evaluation of patient's response to treatment, examination of patient, obtaining history from patient or surrogate, ordering and performing treatments  and interventions, ordering and review of laboratory studies, ordering and review of radiographic studies, pulse oximetry and re-evaluation of patient's condition.  Loralie Champagne 05/26/2019 7:43 AM  Under echo guidance, Impella catheter is at about 3.1 cm in the LV.  Position looks stable, will not adjust.   Loralie Champagne 05/26/2019 8:43 AM

## 2019-05-26 NOTE — Progress Notes (Signed)
  Echocardiogram 2D Echocardiogram has been performed.  Raymond Chavez 05/26/2019, 8:57 AM

## 2019-05-26 NOTE — Progress Notes (Addendum)
[facility] ROUNDING NOTE   Subjective:   68 year old gentleman with a history of cardiogenic shock status post cardiac catheterization 05/21/2019 with severe three-vessel coronary disease.  Echocardiogram reveals ejection fraction of 20%.  Impella device placed 05/02/2019 with switch to 5.0 Impella 05/02/2019.  Continues on milrinone 0.25 mcg and norepinephrine 10 mcg.  Complicated by pneumonia treated with vancomycin/cefepime.  Although it appears that these antibiotics have now been discontinued.  Considering patient for CABG. patient underwent bronchoscopy 05/22/2019 appreciate assistance from critical care medicine.  Started CRRT 05/24/2019 Baseline creatinine 1.3  Blood pressure 116/71 pulse 83 temperature 97.2 O2 sats 99% FiO2 30%  Sodium 136 potassium 4.0 chloride 104 CO2 24 BUN 33 creatinine 1.45 glucose 119 calcium 7.7 phosphorus 1.5 magnesium 2.4 albumin 2.5 WBC 17.9 hemoglobin 8.2 platelets 97  Aspirin 81 mg daily Lipitor 80 mg daily, Protonix 40 mg daily     Urine output none recorded   CRRT was started 05/24/2019 keeping even at this time.  Hoping to wean patient off ventilator  IV amiodarone IV milrinone IV norepinephrine IV Maxipime 2 g every 24 hours IV vancomycin 05/22/2019   Objective:  Vital signs in last 24 hours:  Temp:  [96.1 F (35.6 C)-100.2 F (37.9 C)] 97.2 F (36.2 C) (11/01 0830) Pulse Rate:  [80-94] 80 (10/31 1741) Resp:  [12-31] 17 (11/01 0830) BP: (86-123)/(61-90) 114/86 (11/01 0800) SpO2:  [96 %-100 %] 99 % (11/01 0830) Arterial Line BP: (84-154)/(47-92) 116/71 (11/01 0830) FiO2 (%):  [30 %] 30 % (11/01 0800) Weight:  [71.8 kg] 71.8 kg (11/01 0115)  Weight change: -2.7 kg Filed Weights   05/24/19 0500 05/25/19 0645 05/26/19 0115  Weight: 74.9 kg 74.5 kg 71.8 kg    Intake/Output: I/O last 3 completed shifts: In: 5578.5 [P.O.:80; I.V.:2911.5; Other:387.3; NG/GT:1750; IV Piggyback:449.8] Out: 7418 [Urine:15; Other:7403]    Intake/Output this shift:  Total I/O In: 177.2 [I.V.:67.2; Other:10; NG/GT:100] Out: 143 [Other:143]      General: Sedated NAD HEENT: MMM [place] AT anicteric sclera, orally intubated Neck:  No JVD, no adenopathy CV:  Heart RRR  Lungs:  L/S CTA bilaterally Abd:  abd SNT/ND with normal BS GU:  Bladder non-palpable, positive Foley with clear urine Extremities: +2 bilateral lower extremity edema Skin:  No skin rash   Basic Metabolic Panel: Recent Labs  Lab 05/21/19 0406  05/23/19 0259  05/24/19 0420  05/24/19 1609 05/25/19 0341 05/25/19 0350 05/25/19 1641 05/26/19 0250  NA 136   < > 135   < > 134*   < > 135 136 134* 136 136  K 3.3*   < > 4.5   < > 4.3   < > 4.8 4.9 4.8 4.3 4.0  CL 106   < > 102   < > 100  --  100 101  --  102 104  CO2 20*   < > 20*   < > 22  --  24 23  --  23 24  GLUCOSE 133*   < > 175*   < > 179*  --  160* 193*  --  116* 119*  BUN 58*   < > 72*   < > 47*  --  38* 33*  --  41* 33*  CREATININE 3.87*   < > 4.67*   < > 2.72*  --  2.09* 1.75*  --  1.67* 1.45*  CALCIUM 7.9*   < > 8.2*   < > 7.6*  --  8.1* 8.1*  --  8.1*  7.7*  MG 2.1  --  2.0  --  2.4  --   --  2.5*  --   --  2.4  PHOS 2.5  --  4.6   < > 3.1  --  3.9 3.6  --  2.0* 1.5*   < > = values in this interval not displayed.    Liver Function Tests: Recent Labs  Lab 05/20/19 3474 05/21/19 0406  05/24/19 0420 05/24/19 1609 05/25/19 0341 05/25/19 1641 05/26/19 0250  AST 27 22  --   --   --   --   --   --   ALT 16 12  --   --   --   --   --   --   ALKPHOS 48 56  --   --   --   --   --   --   BILITOT 1.0 1.1  --   --   --   --   --   --   PROT 4.8* 4.7*  --   --   --   --   --   --   ALBUMIN 2.2* 2.2*   < > 2.5* 2.9* 3.0* 2.8* 2.5*   < > = values in this interval not displayed.   No results for input(s): LIPASE, AMYLASE in the last 168 hours. No results for input(s): AMMONIA in the last 168 hours.  CBC: Recent Labs  Lab 05/22/19 0225  05/23/19 0259  05/24/19 0420 05/24/19 0429  05/25/19 0341 05/25/19 0350 05/26/19 0250  WBC 10.3  --  17.6*  --  17.9*  --  19.0*  --  17.9*  HGB 9.0*   < > 8.8*   < > 7.6* 7.5* 9.9* 10.2* 8.2*  HCT 28.3*   < > 26.6*   < > 23.2* 22.0* 30.6* 30.0* 25.4*  MCV 89.8  --  88.4  --  88.2  --  91.1  --  91.7  PLT 30*  --  48*  --  103*  --  109*  --  97*   < > = values in this interval not displayed.    Cardiac Enzymes: No results for input(s): CKTOTAL, CKMB, CKMBINDEX, TROPONINI in the last 168 hours.  BNP: Invalid input(s): POCBNP  CBG: Recent Labs  Lab 05/25/19 1537 05/25/19 1932 05/25/19 2357 05/26/19 0259 05/26/19 0754  GLUCAP 98 134* 144* 116* 112*    Microbiology: Results for orders placed or performed during the hospital encounter of 05/15/2019  Urine culture     Status: None   Collection Time: 05/11/2019 12:16 AM   Specimen: Urine, Random  Result Value Ref Range Status   Specimen Description URINE, RANDOM  Final   Special Requests NONE  Final   Culture   Final    NO GROWTH Performed at [facility], [place] [address], [place], [place] [address]    Report Status 04/28/2019 FINAL  Final  Respiratory Panel by PCR     Status: None   Collection Time: 05/07/2019  1:58 AM   Specimen: Nasopharyngeal Swab; Respiratory  Result Value Ref Range Status   Adenovirus NOT DETECTED NOT DETECTED Final   Coronavirus 229E NOT DETECTED NOT DETECTED Final    Comment: (NOTE) The Coronavirus on the Respiratory Panel, DOES NOT test for the novel  Coronavirus (2019 nCoV)    Coronavirus HKU1 NOT DETECTED NOT DETECTED Final   Coronavirus NL63 NOT DETECTED NOT DETECTED Final   Coronavirus OC43 NOT DETECTED NOT DETECTED Final  Metapneumovirus NOT DETECTED NOT DETECTED Final   Rhinovirus / Enterovirus NOT DETECTED NOT DETECTED Final   Influenza A NOT DETECTED NOT DETECTED Final   Influenza B NOT DETECTED NOT DETECTED Final   Parainfluenza Virus 1 NOT DETECTED NOT DETECTED Final   Parainfluenza Virus 2 NOT DETECTED NOT DETECTED  Final   Parainfluenza Virus 3 NOT DETECTED NOT DETECTED Final   Parainfluenza Virus 4 NOT DETECTED NOT DETECTED Final   Respiratory Syncytial Virus NOT DETECTED NOT DETECTED Final   Bordetella pertussis NOT DETECTED NOT DETECTED Final   Chlamydophila pneumoniae NOT DETECTED NOT DETECTED Final   Mycoplasma pneumoniae NOT DETECTED NOT DETECTED Final    Comment: Performed at [facility], [place] [address] [place], [place] [address]  SARS Coronavirus 2 by RT PCR (hospital order, performed in [facility] hospital lab) Nasopharyngeal Nasopharyngeal Swab     Status: None   Collection Time: 04/26/2019  3:55 PM   Specimen: Nasopharyngeal Swab  Result Value Ref Range Status   SARS Coronavirus 2 NEGATIVE NEGATIVE Final    Comment: (NOTE) If result is NEGATIVE SARS-CoV-2 target nucleic acids are NOT DETECTED. The SARS-CoV-2 RNA is generally detectable in upper and lower  respiratory specimens during the acute phase of infection. The lowest  concentration of SARS-CoV-2 viral copies this assay can detect is 250  copies / mL. A negative result does not preclude SARS-CoV-2 infection  and should not be used as the sole basis for treatment or other  patient management decisions.  A negative result may occur with  improper specimen collection / handling, submission of specimen other  than nasopharyngeal swab, presence of viral mutation(s) within the  areas targeted by this assay, and inadequate number of viral copies  (<250 copies / mL). A negative result must be combined with clinical  observations, patient history, and epidemiological information. If result is POSITIVE SARS-CoV-2 target nucleic acids are DETECTED. The SARS-CoV-2 RNA is generally detectable in upper and lower  respiratory specimens dur ing the acute phase of infection.  Positive  results are indicative of active infection with SARS-CoV-2.  Clinical  correlation with patient history and other diagnostic information is  necessary to  determine patient infection status.  Positive results do  not rule out bacterial infection or co-infection with other viruses. If result is PRESUMPTIVE POSTIVE SARS-CoV-2 nucleic acids MAY BE PRESENT.   A presumptive positive result was obtained on the submitted specimen  and confirmed on repeat testing.  While 2019 novel coronavirus  (SARS-CoV-2) nucleic acids may be present in the submitted sample  additional confirmatory testing may be necessary for epidemiological  and / or clinical management purposes  to differentiate between  SARS-CoV-2 and other Sarbecovirus currently known to infect humans.  If clinically indicated additional testing with an alternate test  methodology [phone]) is advised. The SARS-CoV-2 RNA is generally  detectable in upper and lower respiratory sp ecimens during the acute  phase of infection. The expected result is Negative. Fact Sheet for Patients:  StrictlyIdeas.no Fact Sheet for Healthcare Providers: BankingDealers.co.za This test is not yet approved or cleared by the [place] FDA and has been authorized for detection and/or diagnosis of SARS-CoV-2 by FDA under an Emergency Use Authorization (EUA).  This EUA will remain in effect (meaning this test can be used) for the duration of the COVID-19 declaration under Section 564(b)(1) of the Act, 21 U.S.C. section 360bbb-3(b)(1), unless the authorization is terminated or revoked sooner. Performed at [facility], [place]  [address] [place], [place] [address]   Blood culture (routine x 2)     Status: None   Collection Time: 05/06/2019  9:00 PM   Specimen: BLOOD  Result Value Ref Range Status   Specimen Description BLOOD LEFT ANTECUBITAL  Final   Special Requests   Final    BOTTLES DRAWN AEROBIC AND ANAEROBIC Blood Culture adequate volume   Culture   Final    NO GROWTH 5 DAYS Performed at [facility], [place] [address] [place], [place] [address]     Report Status 05/21/2019 FINAL  Final  Blood culture (routine x 2)     Status: None   Collection Time: 05/14/2019  9:00 PM   Specimen: BLOOD RIGHT HAND  Result Value Ref Range Status   Specimen Description BLOOD RIGHT HAND  Final   Special Requests   Final    AEROBIC BOTTLE ONLY Blood Culture results may not be optimal due to an inadequate volume of blood received in culture bottles   Culture   Final    NO GROWTH 5 DAYS Performed at [facility], [place] [address] [place], [place] [address]    Report Status 05/21/2019 FINAL  Final  MRSA PCR Screening     Status: None   Collection Time: 05/09/2019 11:20 PM   Specimen: Nasal Mucosa; Nasopharyngeal  Result Value Ref Range Status   MRSA by PCR NEGATIVE NEGATIVE Final    Comment:        The GeneXpert MRSA Assay (FDA approved for NASAL specimens only), is one component of a comprehensive MRSA colonization surveillance program. It is not intended to diagnose MRSA infection nor to guide or monitor treatment for MRSA infections. Performed at [facility], [place] [address] [place], [place] [address]   Culture, respiratory (tracheal aspirate)     Status: None   Collection Time: 05/05/2019  3:50 AM   Specimen: Tracheal Aspirate; Respiratory  Result Value Ref Range Status   Specimen Description TRACHEAL ASPIRATE  Final   Special Requests NONE  Final   Gram Stain   Final    RARE WBC PRESENT, PREDOMINANTLY PMN RARE GRAM POSITIVE COCCI IN PAIRS    Culture   Final    RARE Consistent with normal respiratory flora. Performed at [facility], [place] [address] [place], [place] [address]    Report Status 05/20/2019 FINAL  Final  Culture, bal-quantitative     Status: Abnormal   Collection Time: 05/22/19 11:05 AM   Specimen: Bronchoalveolar Lavage; Respiratory  Result Value Ref Range Status   Specimen Description BRONCHIAL ALVEOLAR LAVAGE  Final   Special Requests Normal  Final   Gram Stain   Final    RARE WBC PRESENT,  PREDOMINANTLY PMN NO ORGANISMS SEEN Performed at [facility], [place] [address] [place], [place] [address]    Culture 40,000 COLONIES/mL ENTEROCOCCUS FAECALIS (A)  Final   Report Status 05/25/2019 FINAL  Final   Organism ID, Bacteria ENTEROCOCCUS FAECALIS (A)  Final      Susceptibility   Enterococcus faecalis - MIC*    AMPICILLIN <=2 SENSITIVE Sensitive     VANCOMYCIN 1 SENSITIVE Sensitive     GENTAMICIN SYNERGY SENSITIVE Sensitive     * 40,000 COLONIES/mL ENTEROCOCCUS FAECALIS  Culture, blood (routine x 2)     Status: None (Preliminary result)   Collection Time: 05/23/19  8:49 AM   Specimen: BLOOD  Result Value Ref Range Status   Specimen Description BLOOD LEFT ANTECUBITAL  Final  Special Requests   Final    BOTTLES DRAWN AEROBIC AND ANAEROBIC Blood Culture adequate volume   Culture   Final    NO GROWTH 3 DAYS Performed at [facility], [place] [address] [place], [place] [address]    Report Status PENDING  Incomplete  Culture, blood (routine x 2)     Status: None (Preliminary result)   Collection Time: 05/23/19  8:52 AM   Specimen: BLOOD LEFT HAND  Result Value Ref Range Status   Specimen Description BLOOD LEFT HAND  Final   Special Requests   Final    BOTTLES DRAWN AEROBIC AND ANAEROBIC Blood Culture adequate volume   Culture   Final    NO GROWTH 3 DAYS Performed at [facility], [place] [address], [place], [place] [address]    Report Status PENDING  Incomplete    Coagulation Studies: No results for input(s): LABPROT, INR in the last 72 hours.  Urinalysis: No results for input(s): COLORURINE, LABSPEC, PHURINE, GLUCOSEU, HGBUR, BILIRUBINUR, KETONESUR, PROTEINUR, UROBILINOGEN, NITRITE, LEUKOCYTESUR in the last 72 hours.  Invalid input(s): APPERANCEUR    Imaging: Dg Chest Port 1 View  Result Date: 05/25/2019 CLINICAL DATA:  Follow-up exam.  CHF. EXAM: PORTABLE CHEST 1 VIEW COMPARISON:  05/24/2019 and older exams. FINDINGS: Bilateral interstitial  thickening is similar to the previous day's study. No new lung abnormalities. No areas of consolidation. No convincing pleural effusion and no pneumothorax. Endotracheal tube, nasal/orogastric tube, right internal jugular Swan-Ganz catheter, left internal jugular central venous line and right PICC are stable. Stable left ventricular assist device. IMPRESSION: 1. Bilateral interstitial thickening consistent with mild residual interstitial edema. This is improved compared to 05/23/2019. No new lung abnormalities. 2. Stable support apparatus. Electronically Signed   By: Lajean Manes M.D.   On: 05/25/2019 10:34   Dg Chest Port 1 View  Result Date: 05/24/2019 CLINICAL DATA:  Endotracheal intubation. EXAM: PORTABLE CHEST 1 VIEW COMPARISON:  May 23, 2019. FINDINGS: The heart size and mediastinal contours are within normal limits. Endotracheal and nasogastric tubes are unchanged. Right-sided PICC line is unchanged. Stable left internal jugular catheter. Stable Swan-Ganz catheter in the right internal jugular vein with tip directed toward right pulmonary artery. No pneumothorax or pleural effusion is noted. Stable left ventricular assist device is noted. No acute pulmonary disease is noted. The visualized skeletal structures are unremarkable. IMPRESSION: Stable support apparatus as described above. No acute cardiopulmonary abnormality seen. Electronically Signed   By: Marijo Conception M.D.   On: 05/24/2019 12:10     Medications:   .  prismasol BGK 4/2.5 500 mL/hr at 05/26/19 0311  .  prismasol BGK 4/2.5 500 mL/hr at 05/26/19 0310  . sodium chloride 10 mL/hr at 05/20/19 0910  . sodium chloride 10 mL/hr at 05/26/19 0800  . sodium chloride Stopped (05/25/19 1844)  . sodium chloride    . amiodarone 30 mg/hr (05/26/19 0800)  . ceFEPime (MAXIPIME) IV Stopped (05/25/19 2139)  . dexmedetomidine (PRECEDEX) IV infusion 0.4 mcg/kg/hr (05/26/19 0818)  . feeding supplement (VITAL AF 1.2 CAL) 50 mL/hr at 05/26/19  0600  . fentaNYL infusion INTRAVENOUS 50 mcg/hr (05/26/19 0800)  . impella catheter heparin 50 unit/mL in dextrose 5% 50,000 Units (05/23/19 0815)  . heparin 1,400 Units/hr (05/26/19 0819)  . milrinone 0.25 mcg/kg/min (05/26/19 0800)  . norepinephrine (LEVOPHED) Adult infusion 3 mcg/min (05/26/19 0800)  . prismasol BGK 4/2.5 2,000 mL/hr at 05/26/19 0725  . sodium phosphate  Dextrose 5% IVPB 30 mmol (05/26/19 0813)  .  vancomycin Stopped (05/25/19 1810)   . sodium chloride   Intravenous Once  . arformoterol  15 mcg Nebulization BID  . aspirin  81 mg Per Tube Daily  . atorvastatin  80 mg Per Tube q1800  . B-complex with vitamin C  1 tablet Per Tube Daily  . budesonide (PULMICORT) nebulizer solution  0.5 mg Nebulization BID  . chlorhexidine gluconate (MEDLINE KIT)  15 mL Mouth Rinse BID  . Chlorhexidine Gluconate Cloth  6 each Topical Daily  . feeding supplement (PRO-STAT SUGAR FREE 64)  30 mL Per Tube TID  . insulin aspart  1-3 Units Subcutaneous Q4H  . ipratropium  0.5 mg Nebulization Q6H  . lidocaine-EPINEPHrine  20 mL Infiltration Once  . mouth rinse  15 mL Mouth Rinse 10 times per day  . pantoprazole (PROTONIX) IV  40 mg Intravenous QHS  . sodium chloride flush  10-40 mL Intracatheter Q12H  . sodium chloride flush  3 mL Intravenous Q12H  . sodium chloride flush  3 mL Intravenous Q12H   sodium chloride, Place/Maintain arterial line **AND** sodium chloride, sodium chloride, docusate, fentaNYL, fentaNYL (SUBLIMAZE) injection, fentaNYL (SUBLIMAZE) injection, heparin, influenza vaccine adjuvanted, ipratropium-albuterol, midazolam, pneumococcal 23 valent vaccine, sodium chloride, sodium chloride flush, sodium chloride flush  Assessment/ Plan:  1.Chronic kidney disease baseline serum creatinine 1.34 on admission.  2. Acute kidney injury. Likely secondary to decreased cardiac output in the setting of congestive heart failure. He may have had some component of contrast-induced  nephropathy.     Continue to avoid nephrotoxins no ACE inhibitor's ARB use nonsteroidal inflammatory drugs IV contrast.  Creatinine appears to have worsened.  Blood pressure tenuous.  Oxygen requirements of increased.  Appreciate assistance from critical care medicine with bronchoscopy.  We will continue CRRT  3.  Hypertension/volume  CRRT keeping even unable to pull much ultrafiltration at this present time.  3. Coronary artery disease. Triple-vessel disease noted on cardiac catheterization. For possible CABG. 75% distal left main stenosis  4.Acute hypoxemic respiratory failure. Secondary to congestive heart failure.   Looks relatively euvolemic.   5. Acute systolic congestive heart failure. Ejection fraction less than 20%. Continue Impella device support.   Being considered for possible CABG. hoping to extubate patient in order to inform patient of options.  6.  Hypotension/volume continues on inotropic support.  Milrinone and Levophed  7.  Pneumonia.  Vancomycin, Maxipime  administered 05/22/2019  8.  Hypokalemia resolved  9.  Hypophosphatemia will replete   LOS: [place] _0 _1 :57 AM

## 2019-05-26 NOTE — Progress Notes (Signed)
30 mL of Versed wasted in steri-cycle container witnessed by Caron Presume RN.

## 2019-05-26 DEATH — deceased

## 2019-05-27 ENCOUNTER — Inpatient Hospital Stay (HOSPITAL_COMMUNITY): Payer: PPO

## 2019-05-27 DIAGNOSIS — I5031 Acute diastolic (congestive) heart failure: Secondary | ICD-10-CM

## 2019-05-27 DIAGNOSIS — A4181 Sepsis due to Enterococcus: Secondary | ICD-10-CM

## 2019-05-27 LAB — RENAL FUNCTION PANEL
Albumin: 2.3 g/dL — ABNORMAL LOW (ref 3.5–5.0)
Albumin: 2.2 g/dL — ABNORMAL LOW (ref 3.5–5.0)
Anion gap: 13 (ref 5–15)
Anion gap: 14 (ref 5–15)
BUN: 36 mg/dL — ABNORMAL HIGH (ref 8–23)
BUN: 32 mg/dL — ABNORMAL HIGH (ref 8–23)
CO2: 21 mmol/L — ABNORMAL LOW (ref 22–32)
CO2: 21 mmol/L — ABNORMAL LOW (ref 22–32)
Calcium: 7.8 mg/dL — ABNORMAL LOW (ref 8.9–10.3)
Calcium: 7.7 mg/dL — ABNORMAL LOW (ref 8.9–10.3)
Chloride: 101 mmol/L (ref 98–111)
Chloride: 100 mmol/L (ref 98–111)
Creatinine, Ser: 1.39 mg/dL — ABNORMAL HIGH (ref 0.61–1.24)
Creatinine, Ser: 1.31 mg/dL — ABNORMAL HIGH (ref 0.61–1.24)
GFR calc Af Amer: 60 mL/min — ABNORMAL LOW (ref >60)
GFR calc Af Amer: >60 mL/min (ref >60)
GFR calc non Af Amer: 51 mL/min — ABNORMAL LOW (ref >60)
GFR calc non Af Amer: 55 mL/min — ABNORMAL LOW (ref >60)
Glucose, Bld: 124 mg/dL — ABNORMAL HIGH (ref 70–99)
Glucose, Bld: 135 mg/dL — ABNORMAL HIGH (ref 70–99)
Phosphorus: 1.8 mg/dL — ABNORMAL LOW (ref 2.5–4.6)
Phosphorus: 3.8 mg/dL (ref 2.5–4.6)
Potassium: 4.4 mmol/L (ref 3.5–5.1)
Potassium: 4.4 mmol/L (ref 3.5–5.1)
Sodium: 135 mmol/L (ref 135–145)
Sodium: 135 mmol/L (ref 135–145)

## 2019-05-27 LAB — POCT ACTIVATED CLOTTING TIME
Activated Clotting Time: 164 seconds
Activated Clotting Time: 175 seconds
Activated Clotting Time: 175 seconds
Activated Clotting Time: 180 seconds
Activated Clotting Time: 180 seconds
Activated Clotting Time: 180 seconds
Activated Clotting Time: 186 seconds
Activated Clotting Time: 186 seconds
Activated Clotting Time: 186 seconds

## 2019-05-27 LAB — POCT I-STAT 7, (LYTES, BLD GAS, ICA,H+H)
Acid-Base Excess: 1 mmol/L (ref 0.0–2.0)
Acid-Base Excess: 1 mmol/L (ref 0.0–2.0)
Acid-Base Excess: 1 mmol/L (ref 0.0–2.0)
Bicarbonate: 26.1 mmol/L (ref 20.0–28.0)
Bicarbonate: 22.6 mmol/L (ref 20.0–28.0)
Bicarbonate: 22.2 mmol/L (ref 20.0–28.0)
Calcium, Ion: 1.15 mmol/L (ref 1.15–1.40)
Calcium, Ion: 1.14 mmol/L — ABNORMAL LOW (ref 1.15–1.40)
Calcium, Ion: 1.04 mmol/L — ABNORMAL LOW (ref 1.15–1.40)
HCT: 23 % — ABNORMAL LOW (ref 39.0–52.0)
HCT: 24 % — ABNORMAL LOW (ref 39.0–52.0)
HCT: 21 % — ABNORMAL LOW (ref 39.0–52.0)
Hemoglobin: 7.8 g/dL — ABNORMAL LOW (ref 13.0–17.0)
Hemoglobin: 8.2 g/dL — ABNORMAL LOW (ref 13.0–17.0)
Hemoglobin: 7.1 g/dL — ABNORMAL LOW (ref 13.0–17.0)
O2 Saturation: 98 %
O2 Saturation: 96 %
O2 Saturation: 96 %
Patient temperature: 36
Patient temperature: 36.8
Patient temperature: 36.3
Potassium: 4 mmol/L (ref 3.5–5.1)
Potassium: 4.4 mmol/L (ref 3.5–5.1)
Potassium: 4.7 mmol/L (ref 3.5–5.1)
Sodium: 135 mmol/L (ref 135–145)
Sodium: 133 mmol/L — ABNORMAL LOW (ref 135–145)
Sodium: 135 mmol/L (ref 135–145)
TCO2: 27 mmol/L (ref 22–32)
TCO2: 23 mmol/L (ref 22–32)
TCO2: 23 mmol/L (ref 22–32)
pCO2 arterial: 42 mmHg (ref 32.0–48.0)
pCO2 arterial: 23.8 mmHg — ABNORMAL LOW (ref 32.0–48.0)
pCO2 arterial: 22.2 mmHg — ABNORMAL LOW (ref 32.0–48.0)
pH, Arterial: 7.398 (ref 7.350–7.450)
pH, Arterial: 7.585 — ABNORMAL HIGH (ref 7.350–7.450)
pH, Arterial: 7.605 (ref 7.350–7.450)
pO2, Arterial: 110 mmHg — ABNORMAL HIGH (ref 83.0–108.0)
pO2, Arterial: 66 mmHg — ABNORMAL LOW (ref 83.0–108.0)
pO2, Arterial: 62 mmHg — ABNORMAL LOW (ref 83.0–108.0)

## 2019-05-27 LAB — CBC WITH DIFFERENTIAL/PLATELET
Abs Immature Granulocytes: 0.47 10*3/uL — ABNORMAL HIGH (ref 0.00–0.07)
Basophils Absolute: 0.1 10*3/uL (ref 0.0–0.1)
Basophils Relative: 0 %
Eosinophils Absolute: 0.1 10*3/uL (ref 0.0–0.5)
Eosinophils Relative: 0 %
HCT: 22.8 % — ABNORMAL LOW (ref 39.0–52.0)
Hemoglobin: 7.7 g/dL — ABNORMAL LOW (ref 13.0–17.0)
Immature Granulocytes: 2 %
Lymphocytes Relative: 14 %
Lymphs Abs: 3.8 10*3/uL (ref 0.7–4.0)
MCH: 30.1 pg (ref 26.0–34.0)
MCHC: 33.8 g/dL (ref 30.0–36.0)
MCV: 89.1 fL (ref 80.0–100.0)
Monocytes Absolute: 1.9 10*3/uL — ABNORMAL HIGH (ref 0.1–1.0)
Monocytes Relative: 7 %
Neutro Abs: 20.5 10*3/uL — ABNORMAL HIGH (ref 1.7–7.7)
Neutrophils Relative %: 77 %
Platelets: 95 10*3/uL — ABNORMAL LOW (ref 150–400)
RBC: 2.56 MIL/uL — ABNORMAL LOW (ref 4.22–5.81)
RDW: 17.7 % — ABNORMAL HIGH (ref 11.5–15.5)
WBC: 26.8 10*3/uL — ABNORMAL HIGH (ref 4.0–10.5)
nRBC: 0.4 % — ABNORMAL HIGH (ref 0.0–0.2)

## 2019-05-27 LAB — CBC
HCT: 22 % — ABNORMAL LOW (ref 39.0–52.0)
Hemoglobin: 7.2 g/dL — ABNORMAL LOW (ref 13.0–17.0)
MCH: 29.4 pg (ref 26.0–34.0)
MCHC: 32.7 g/dL (ref 30.0–36.0)
MCV: 89.8 fL (ref 80.0–100.0)
Platelets: 120 10*3/uL — ABNORMAL LOW (ref 150–400)
RBC: 2.45 MIL/uL — ABNORMAL LOW (ref 4.22–5.81)
RDW: 17.6 % — ABNORMAL HIGH (ref 11.5–15.5)
WBC: 30 10*3/uL — ABNORMAL HIGH (ref 4.0–10.5)
nRBC: 0.5 % — ABNORMAL HIGH (ref 0.0–0.2)

## 2019-05-27 LAB — PREPARE RBC (CROSSMATCH)

## 2019-05-27 LAB — GLUCOSE, CAPILLARY
Glucose-Capillary: 111 mg/dL — ABNORMAL HIGH (ref 70–99)
Glucose-Capillary: 115 mg/dL — ABNORMAL HIGH (ref 70–99)
Glucose-Capillary: 116 mg/dL — ABNORMAL HIGH (ref 70–99)
Glucose-Capillary: 120 mg/dL — ABNORMAL HIGH (ref 70–99)
Glucose-Capillary: 129 mg/dL — ABNORMAL HIGH (ref 70–99)
Glucose-Capillary: 132 mg/dL — ABNORMAL HIGH (ref 70–99)
Glucose-Capillary: 134 mg/dL — ABNORMAL HIGH (ref 70–99)

## 2019-05-27 LAB — COOXEMETRY PANEL
Carboxyhemoglobin: 1.9 % — ABNORMAL HIGH (ref 0.5–1.5)
Carboxyhemoglobin: 2.1 % — ABNORMAL HIGH (ref 0.5–1.5)
Carboxyhemoglobin: 1.9 % — ABNORMAL HIGH (ref 0.5–1.5)
Carboxyhemoglobin: 2 % — ABNORMAL HIGH (ref 0.5–1.5)
Carboxyhemoglobin: 2.2 % — ABNORMAL HIGH (ref 0.5–1.5)
Methemoglobin: 1.2 % (ref 0.0–1.5)
Methemoglobin: 1.2 % (ref 0.0–1.5)
Methemoglobin: 0.9 % (ref 0.0–1.5)
Methemoglobin: 1 % (ref 0.0–1.5)
Methemoglobin: 1.2 % (ref 0.0–1.5)
O2 Saturation: 37.4 %
O2 Saturation: 33.7 %
O2 Saturation: 27.7 %
O2 Saturation: 38.1 %
O2 Saturation: 48.4 %
Total hemoglobin: 10.7 g/dL — ABNORMAL LOW (ref 12.0–16.0)
Total hemoglobin: 8.4 g/dL — ABNORMAL LOW (ref 12.0–16.0)
Total hemoglobin: 7.9 g/dL — ABNORMAL LOW (ref 12.0–16.0)
Total hemoglobin: 8.9 g/dL — ABNORMAL LOW (ref 12.0–16.0)
Total hemoglobin: 7.6 g/dL — ABNORMAL LOW (ref 12.0–16.0)

## 2019-05-27 LAB — MAGNESIUM: Magnesium: 2.5 mg/dL — ABNORMAL HIGH (ref 1.7–2.4)

## 2019-05-27 LAB — HEMOGLOBIN AND HEMATOCRIT, BLOOD
HCT: 24.3 % — ABNORMAL LOW (ref 39.0–52.0)
Hemoglobin: 8.1 g/dL — ABNORMAL LOW (ref 13.0–17.0)

## 2019-05-27 LAB — ECHOCARDIOGRAM LIMITED
Height: 67 in
Weight: 2574.97 oz

## 2019-05-27 LAB — HEPARIN LEVEL (UNFRACTIONATED): Heparin Unfractionated: 1.04 IU/mL — ABNORMAL HIGH (ref 0.30–0.70)

## 2019-05-27 LAB — LACTATE DEHYDROGENASE: LDH: 589 U/L — ABNORMAL HIGH (ref 98–192)

## 2019-05-27 MED ORDER — SODIUM PHOSPHATES 45 MMOLE/15ML IV SOLN
30.0000 mmol | Freq: Once | INTRAVENOUS | Status: AC
  Administered 2019-05-27: 30 mmol via INTRAVENOUS
  Filled 2019-05-27: qty 10

## 2019-05-27 MED ORDER — ASPIRIN 300 MG RE SUPP
150.0000 mg | Freq: Once | RECTAL | Status: AC
  Administered 2019-05-27: 10:00:00 150 mg via RECTAL
  Filled 2019-05-27: qty 1

## 2019-05-27 MED ORDER — SODIUM CHLORIDE 0.9% IV SOLUTION
Freq: Once | INTRAVENOUS | Status: AC
  Administered 2019-05-27: 08:00:00 via INTRAVENOUS

## 2019-05-27 MED ORDER — MILRINONE LACTATE IN DEXTROSE 20-5 MG/100ML-% IV SOLN
0.5000 ug/kg/min | INTRAVENOUS | Status: DC
  Administered 2019-05-27 – 2019-05-28 (×4): 0.5 ug/kg/min via INTRAVENOUS
  Filled 2019-05-27 (×4): qty 100

## 2019-05-27 MED ORDER — SODIUM CHLORIDE 0.9% IV SOLUTION: Freq: Once | INTRAVENOUS | Status: DC

## 2019-05-27 NOTE — Progress Notes (Signed)
[facility] NEPHROLOGY PROGRESS NOTE  Assessment/ Plan: Pt is a 68 y.o. yo male with cardiogenic shock who underwent placement of Impella on 10/24, on milrinone and Levophed, cardiac cath with three-vessel disease and echocardiogram revealing EF of 20%, respiratory failure, consulted for AKI.  Baseline creatinine level around 1.3.  #Acute kidney injury on CKD: Likely due to cardiorenal syndrome and contrast nephropathy. CRRT since 10/30, keeping even.  Today patient with elevated CVP therefore plan for UF today.  On 4K bath and IV heparin.  #Cardiogenic shock: EF 20%, has Impella.  Heart failure team is following.  On milrinone and Levophed for shock.  #Acute respiratory failure with hypoxia: Due to CHF and pneumonia.  Pulmonary team is following.  On ampicillin.  #CAD: NSTEMI, CABG with severe three-vessel disease.  On aspirin statin.  No beta-blocker because of shock.  #Hypophosphatemia: Replete phosphorus.  Monitor lab.  Subjective: Seen and examined at bedside.  Patient was alert awake and following commands.  Blood pressure soft on pressors.  Running CRRT well even.  Discussed with nursing staff. Objective Vital signs in last 24 hours: Vitals:   05/27/19 0645 05/27/19 0700 05/27/19 0807 05/27/19 0812  BP: 98/72 96/62 97/61    Pulse:   88   Resp: (!) 38 (!) 34 (!) 30   Temp: 97.7 F (36.5 C) 97.7 F (36.5 C)    TempSrc:      SpO2: 96% 97% 98% 98%  Weight:      Height:       Weight change: 1.2 kg  Intake/Output Summary (Last 24 hours) at 05/27/2019 0818 Last data filed at 05/27/2019 0800 Gross per 24 hour  Intake 2686.21 ml  Output 2330 ml  Net 356.21 ml       Labs: Basic Metabolic Panel: Recent Labs  Lab 05/26/19 0250 05/26/19 1831 05/26/19 2115 05/27/19 0309 05/27/19 0323  NA 136 135 135 135 133*  K 4.0 4.1 4.1 4.4 4.4  CL 104 103  --  101  --   CO2 24 21*  --  21*  --   GLUCOSE 119* 125*  --  124*  --   BUN 33* 40*  --  36*  --    CREATININE 1.45* 1.36*  --  1.39*  --   CALCIUM 7.7* 8.1*  --  7.8*  --   PHOS 1.5* 1.9*  --  1.8*  --    Liver Function Tests: Recent Labs  Lab 05/21/19 0406  05/26/19 0250 05/26/19 1831 05/27/19 0309  AST 22  --   --   --   --   ALT 12  --   --   --   --   ALKPHOS 56  --   --   --   --   BILITOT 1.1  --   --   --   --   PROT 4.7*  --   --   --   --   ALBUMIN 2.2*   < > 2.5* 2.4* 2.3*   < > = values in this interval not displayed.   No results for input(s): LIPASE, AMYLASE in the last 168 hours. No results for input(s): AMMONIA in the last 168 hours. CBC: Recent Labs  Lab 05/23/19 0259  05/24/19 0420  05/25/19 0341  05/26/19 0250 05/26/19 2115 05/27/19 0309 05/27/19 0323  WBC 17.6*  --  17.9*  --  19.0*  --  17.9*  --  30.0*  --   HGB 8.8*   < >  7.6*   < > 9.9*   < > 8.2* 7.8* 7.2* 8.2*  HCT 26.6*   < > 23.2*   < > 30.6*   < > 25.4* 23.0* 22.0* 24.0*  MCV 88.4  --  88.2  --  91.1  --  91.7  --  89.8  --   PLT 48*  --  103*  --  109*  --  97*  --  120*  --    < > = values in this interval not displayed.   Cardiac Enzymes: No results for input(s): CKTOTAL, CKMB, CKMBINDEX, TROPONINI in the last 168 hours. CBG: Recent Labs  Lab 05/26/19 1207 05/26/19 1611 05/26/19 2019 05/27/19 0011 05/27/19 0315  GLUCAP 159* 135* 117* 116* 134*    Iron Studies: No results for input(s): IRON, TIBC, TRANSFERRIN, FERRITIN in the last 72 hours. Studies/Results: Dg Chest Port 1 View  Result Date: 05/27/2019 CLINICAL DATA:  Impella catheter placement EXAM: PORTABLE CHEST 1 VIEW COMPARISON:  05/25/2019 FINDINGS: The right upper extremity Impella projects over the expected region of the left ventricle. The Swan-Ganz catheter tip projects over the main pulmonary artery. The left-sided central venous catheter tip projects over the SVC/left brachiocephalic vein. Diffuse hazy bilateral airspace opacities with prominent interstitial lung markings are again noted. The heart size remains  enlarged. There is no pneumothorax. IMPRESSION: 1. Lines and tubes as above.  No pneumothorax. 2. Worsening pulmonary edema. Electronically Signed   By: Katherine Mantle M.D.   On: 05/27/2019 05:49   Dg Chest Port 1 View  Result Date: 05/25/2019 CLINICAL DATA:  Follow-up exam.  CHF. EXAM: PORTABLE CHEST 1 VIEW COMPARISON:  05/24/2019 and older exams. FINDINGS: Bilateral interstitial thickening is similar to the previous day's study. No new lung abnormalities. No areas of consolidation. No convincing pleural effusion and no pneumothorax. Endotracheal tube, nasal/orogastric tube, right internal jugular Swan-Ganz catheter, left internal jugular central venous line and right PICC are stable. Stable left ventricular assist device. IMPRESSION: 1. Bilateral interstitial thickening consistent with mild residual interstitial edema. This is improved compared to 05/23/2019. No new lung abnormalities. 2. Stable support apparatus. Electronically Signed   By: Amie Portland M.D.   On: 05/25/2019 10:34    Medications: Infusions: .  prismasol BGK 4/2.5 500 mL/hr at 05/27/19 0005  .  prismasol BGK 4/2.5 500 mL/hr at 05/27/19 0801  . sodium chloride 10 mL/hr at 05/20/19 0910  . sodium chloride 10 mL/hr at 05/27/19 0800  . sodium chloride Stopped (05/25/19 1844)  . sodium chloride    . amiodarone 30 mg/hr (05/27/19 0800)  . ampicillin (OMNIPEN) IV Stopped (05/27/19 5093)  . impella catheter heparin 50 unit/mL in dextrose 5% 50,000 Units (05/23/19 0815)  . heparin 1,000 Units/hr (05/27/19 0817)  . milrinone 0.25 mcg/kg/min (05/27/19 0800)  . norepinephrine (LEVOPHED) Adult infusion 26 mcg/min (05/27/19 0800)  . prismasol BGK 4/2.5 2,000 mL/hr at 05/27/19 0326  . sodium phosphate  Dextrose 5% IVPB      Scheduled Medications: . sodium chloride   Intravenous Once  . sodium chloride   Intravenous Once  . arformoterol  15 mcg Nebulization BID  . aspirin  81 mg Per Tube Daily  . atorvastatin  80 mg Per Tube  q1800  . B-complex with vitamin C  1 tablet Per Tube Daily  . chlorhexidine  15 mL Mouth Rinse BID  . Chlorhexidine Gluconate Cloth  6 each Topical Daily  . insulin aspart  1-3 Units Subcutaneous Q4H  . ipratropium  0.5  mg Nebulization BID  . lidocaine-EPINEPHrine  20 mL Infiltration Once  . mouth rinse  15 mL Mouth Rinse q12n4p  . pantoprazole (PROTONIX) IV  40 mg Intravenous QHS  . sodium chloride flush  10-40 mL Intracatheter Q12H  . sodium chloride flush  3 mL Intravenous Q12H    have reviewed scheduled and prn medications.  Physical Exam: General:NAD, comfortable Heart:RRR, s1s2 nl Lungs:clear b/l, no crackle Abdomen:soft, Non-tender, non-distended Extremities:No edema Dialysis Access: Left IJ catheter site clean.  Airyanna Dipalma Prasad Randon Somera 05/27/2019,8:18 AM  LOS: 11 days  Pager: [phone]

## 2019-05-27 NOTE — Progress Notes (Signed)
AM ABG results called to [facility]. No new orders. Pt on 7L HFNC with tachypnea in the 20-30's. Pt resting comfortably in bed denying any pain.   AM lab results told to Cardiology on-call MD: CoOx 37 (dropped from yesterday's 82.1) Hgb 7.2 (dropped from yesterday's 8.2)  No new orders at this time. MD stated that, "if patient remains hemodynamically stable, we will let the rounding team in the morning take care of it."  Pt's VSS. No evidence of bleeding anywhere. MAP's >65 all shift with some pressor support.  Will continue to monitor pt closely.

## 2019-05-27 NOTE — Progress Notes (Signed)
ANTICOAGULATION CONSULT NOTE - Follow up [place] for heparin Indication: Impella 5.0  No Known Allergies  Patient Measurements: Height: 5\' 7"  (170.2 cm)(measured x3) Weight: 160 lb 15 oz (73 kg) IBW/kg (Calculated) : 66.1  Vital Signs: Temp: 97.3 F (36.3 C) (11/02 0845) Temp Source: Oral (11/02 0823) BP: 83/66 (11/02 0830) Pulse Rate: 82 (11/02 0823)  Labs: Recent Labs    05/25/19 0341  05/26/19 0250  05/26/19 1831 05/26/19 2115 05/27/19 0309 05/27/19 0323  HGB 9.9*   < > 8.2*   < >  --  7.8* 7.2* 8.2*  HCT 30.6*   < > 25.4*   < >  --  23.0* 22.0* 24.0*  PLT 109*  --  97*  --   --   --  120*  --   HEPARINUNFRC <0.10*  --  0.96*  --   --   --  1.04*  --   CREATININE 1.75*   < > 1.45*  --  1.36*  --  1.39*  --    < > = values in this interval not displayed.    Estimated Creatinine Clearance: 46.9 mL/min (A) (by C-G formula based on SCr of 1.39 mg/dL (H)).   Medical History: Past Medical History:  Diagnosis Date  . Acute respiratory failure with hypoxemia ([place]) 04/2019  . Carotid artery occlusion   . Hypertension     Medications:  Scheduled:  . sodium chloride   Intravenous Once  . arformoterol  15 mcg Nebulization BID  . aspirin  81 mg Per Tube Daily  . aspirin  150 mg Rectal Once  . atorvastatin  80 mg Per Tube q1800  . B-complex with vitamin C  1 tablet Per Tube Daily  . chlorhexidine  15 mL Mouth Rinse BID  . Chlorhexidine Gluconate Cloth  6 each Topical Daily  . insulin aspart  1-3 Units Subcutaneous Q4H  . ipratropium  0.5 mg Nebulization BID  . lidocaine-EPINEPHrine  20 mL Infiltration Once  . mouth rinse  15 mL Mouth Rinse q12n4p  . pantoprazole (PROTONIX) IV  40 mg Intravenous QHS  . sodium chloride flush  10-40 mL Intracatheter Q12H  . sodium chloride flush  3 mL Intravenous Q12H   Infusions:  .  prismasol BGK 4/2.5 500 mL/hr at 05/27/19 0005  .  prismasol BGK 4/2.5 500 mL/hr at 05/27/19 0801  . sodium chloride 10 mL/hr at  05/20/19 0910  . sodium chloride Stopped (05/27/19 0903)  . sodium chloride Stopped (05/25/19 1844)  . sodium chloride    . amiodarone 30 mg/hr (05/27/19 1000)  . ampicillin (OMNIPEN) IV Stopped (05/27/19 1025)  . impella catheter heparin 50 unit/mL in dextrose 5% 50,000 Units (05/23/19 0815)  . heparin 1,000 Units/hr (05/27/19 1000)  . milrinone 0.25 mcg/kg/min (05/27/19 1000)  . norepinephrine (LEVOPHED) Adult infusion 30 mcg/min (05/27/19 1000)  . prismasol BGK 4/2.5 2,000 mL/hr at 05/27/19 0326  . sodium phosphate  Dextrose 5% IVPB 43 mL/hr at 05/27/19 1000    Assessment: 48 yoM admitted with shock and concern for ACS now s/p Impella CP placement in cath lab. Impella CP swapped out for 5.0 d/t hemolysis.   Purge solution running at 13.7 ml/hr, providing 685 units/hr of heparin. Systemic heparin running at 10 ml/hr, providing 1000 units/hr of heparin. ACT this AM is now within goal at 180 after decreasing systemic heparin given elevated ACT overnight.  No overt bleeding. Hgb down to 7.2 and given 1 unit PRBC, pltc low improved at  120. Heparin level elevated but will not adjust based on this level since dosing based on ACTs.  Goal of Therapy:  ACT 160-180 Monitor platelets by anticoagulation protocol: Yes   Plan:  Continue heparin in purge solution and systemic heparin Check ACT and titrate heparin rate per RN protocols Monitor daily heparin level, ACT, CBC Monitor for bleeding  Tama Headings, PharmD PGY1 Pharmacy Resident Phone: [phone] 05/27/2019  10:07 AM  Please check [email or web address] for unit-specific pharmacy phone numbers.

## 2019-05-27 NOTE — Progress Notes (Signed)
Rehab Admissions Coordinator Note:  Patient was screened by Michel Santee for appropriateness for an Inpatient Acute Rehab Consult.  At this time, we are recommending Inpatient Rehab consult.  Please place a rehab consult order if pt would like to be considered.   Michel Santee 05/27/2019, 1:08 PM  I can be reached at [phone].

## 2019-05-27 NOTE — Progress Notes (Signed)
ANTICOAGULATION CONSULT NOTE - Follow up Consult  Pharmacy Consult for heparin Indication: Impella 5.0  No Known Allergies  Patient Measurements: Height: 5\' 7"  (170.2 cm)(measured x3) Weight: 160 lb 15 oz (73 kg) IBW/kg (Calculated) : 66.1  Vital Signs: Temp: 97.2 F (36.2 C) (11/02 2100) Temp Source: Core (Comment) (11/02 2000) BP: 98/64 (11/02 2000) Pulse Rate: 78 (11/02 1600)  Labs: Recent Labs    05/25/19 0341  05/26/19 0250  05/26/19 1831  05/27/19 0309  05/27/19 0749 05/27/19 1414 05/27/19 1651  HGB 9.9*   < > 8.2*   < >  --    < > 7.2*   < > 7.1* 8.1* 7.7*  HCT 30.6*   < > 25.4*   < >  --    < > 22.0*   < > 21.0* 24.3* 22.8*  PLT 109*  --  97*  --   --   --  120*  --   --   --  95*  HEPARINUNFRC <0.10*  --  0.96*  --   --   --  1.04*  --   --   --   --   CREATININE 1.75*   < > 1.45*  --  1.36*  --  1.39*  --   --  1.31*  --    < > = values in this interval not displayed.    Estimated Creatinine Clearance: 49.8 mL/min (A) (by C-G formula based on SCr of 1.31 mg/dL (H)).   Medical History: Past Medical History:  Diagnosis Date  . Acute respiratory failure with hypoxemia ([facility]) 04/2019  . Carotid artery occlusion   . Hypertension     Medications:  Scheduled:  . sodium chloride   Intravenous Once  . sodium chloride   Intravenous Once  . arformoterol  15 mcg Nebulization BID  . aspirin  81 mg Per Tube Daily  . atorvastatin  80 mg Per Tube q1800  . B-complex with vitamin C  1 tablet Per Tube Daily  . chlorhexidine  15 mL Mouth Rinse BID  . Chlorhexidine Gluconate Cloth  6 each Topical Daily  . insulin aspart  1-3 Units Subcutaneous Q4H  . ipratropium  0.5 mg Nebulization BID  . lidocaine-EPINEPHrine  20 mL Infiltration Once  . mouth rinse  15 mL Mouth Rinse q12n4p  . pantoprazole (PROTONIX) IV  40 mg Intravenous QHS  . sodium chloride flush  10-40 mL Intracatheter Q12H  . sodium chloride flush  3 mL Intravenous Q12H   Infusions:  .  prismasol BGK  4/2.5 500 mL/hr at 05/27/19 1012  .  prismasol BGK 4/2.5 500 mL/hr at 05/27/19 0801  . sodium chloride 10 mL/hr at 05/20/19 0910  . sodium chloride 10 mL/hr at 05/27/19 2100  . sodium chloride Stopped (05/25/19 1844)  . sodium chloride    . amiodarone 30 mg/hr (05/27/19 2100)  . ampicillin (OMNIPEN) IV Stopped (05/27/19 1524)  . impella catheter heparin 50 unit/mL in dextrose 5% 50,000 Units (05/23/19 0815)  . heparin 1,000 Units/hr (05/27/19 2100)  . milrinone 0.5 mcg/kg/min (05/27/19 2100)  . norepinephrine (LEVOPHED) Adult infusion 18 mcg/min (05/27/19 2100)  . prismasol BGK 4/2.5 2,000 mL/hr at 05/27/19 1618    Assessment: 68 yoM admitted with shock and concern for ACS now s/p Impella CP placement in cath lab. Impella CP swapped out for 5.0 d/t hemolysis.   Purge solution running at 13.3 ml/hr, providing 665 units/hr of heparin. Systemic heparin running at 10 ml/hr, providing 1000 units/hr of  heparin. ACT this PM is within goal at 175.  No overt bleeding. Hgb is up at 7.7 and pltc are down to 95.  Monitoring LDH - peaked at 1500 but now trending down since switching Impella CP to 5.0.  Goal of Therapy:  ACT 160-180 Monitor platelets by anticoagulation protocol: Yes   Plan:  Continue heparin in purge solution and systemic heparin Check ACT and titrate heparin rate per RN protocols Monitor daily heparin level, ACT, CBC Monitor for bleeding  Sloan Leiter, PharmD, BCPS, BCCCP Clinical Pharmacist Clinical phone 05/27/2019 until 10:30P - #[phone] Please refer to [facility] for [place] numbers 05/27/2019  9:44 PM  Please check [email or web address] for unit-specific pharmacy phone numbers.

## 2019-05-27 NOTE — Progress Notes (Signed)
ANTICOAGULATION CONSULT NOTE - Follow up Consult  Pharmacy Consult for heparin Indication: Impella 5.0  No Known Allergies  Patient Measurements: Height: 5\' 7"  (170.2 cm)(measured x3) Weight: 160 lb 15 oz (73 kg) IBW/kg (Calculated) : 66.1  Vital Signs: Temp: 97.4 F (36.3 C) (11/02 1200) Temp Source: Core (11/02 1200) BP: 108/76 (11/02 1200) Pulse Rate: 91 (11/02 1200)  Labs: Recent Labs    05/25/19 0341  05/26/19 0250  05/26/19 1831  05/27/19 0309 05/27/19 0323 05/27/19 0749  HGB 9.9*   < > 8.2*   < >  --    < > 7.2* 8.2* 7.1*  HCT 30.6*   < > 25.4*   < >  --    < > 22.0* 24.0* 21.0*  PLT 109*  --  97*  --   --   --  120*  --   --   HEPARINUNFRC <0.10*  --  0.96*  --   --   --  1.04*  --   --   CREATININE 1.75*   < > 1.45*  --  1.36*  --  1.39*  --   --    < > = values in this interval not displayed.    Estimated Creatinine Clearance: 46.9 mL/min (A) (by C-G formula based on SCr of 1.39 mg/dL (H)).   Medical History: Past Medical History:  Diagnosis Date  . Acute respiratory failure with hypoxemia ([facility]) 04/2019  . Carotid artery occlusion   . Hypertension     Medications:  Scheduled:  . sodium chloride   Intravenous Once  . arformoterol  15 mcg Nebulization BID  . aspirin  81 mg Per Tube Daily  . atorvastatin  80 mg Per Tube q1800  . B-complex with vitamin C  1 tablet Per Tube Daily  . chlorhexidine  15 mL Mouth Rinse BID  . Chlorhexidine Gluconate Cloth  6 each Topical Daily  . insulin aspart  1-3 Units Subcutaneous Q4H  . ipratropium  0.5 mg Nebulization BID  . lidocaine-EPINEPHrine  20 mL Infiltration Once  . mouth rinse  15 mL Mouth Rinse q12n4p  . pantoprazole (PROTONIX) IV  40 mg Intravenous QHS  . sodium chloride flush  10-40 mL Intracatheter Q12H  . sodium chloride flush  3 mL Intravenous Q12H   Infusions:  .  prismasol BGK 4/2.5 500 mL/hr at 05/27/19 1012  .  prismasol BGK 4/2.5 500 mL/hr at 05/27/19 0801  . sodium chloride 10 mL/hr at  05/20/19 0910  . sodium chloride Stopped (05/27/19 0903)  . sodium chloride Stopped (05/25/19 1844)  . sodium chloride    . amiodarone 30 mg/hr (05/27/19 1300)  . ampicillin (OMNIPEN) IV Stopped (05/27/19 13/02/20)  . impella catheter heparin 50 unit/mL in dextrose 5% 50,000 Units (05/23/19 0815)  . heparin 1,000 Units/hr (05/27/19 1300)  . milrinone 0.25 mcg/kg/min (05/27/19 1300)  . norepinephrine (LEVOPHED) Adult infusion 22 mcg/min (05/27/19 1300)  . prismasol BGK 4/2.5 2,000 mL/hr at 05/27/19 1144  . sodium phosphate  Dextrose 5% IVPB 43 mL/hr at 05/27/19 1300    Assessment: 86 yoM admitted with shock and concern for ACS now s/p Impella CP placement in cath lab. Impella CP swapped out for 5.0 d/t hemolysis.   Purge solution running at 13.7 ml/hr, providing 685 units/hr of heparin. Systemic heparin running at 10 ml/hr, providing 1000 units/hr of heparin. ACT this AM is now within goal at 180 after decreasing systemic heparin given elevated ACT overnight.  No overt bleeding. Hgb down to  7.2 and given 1 unit PRBC, pltc low improved at 120. Heparin level elevated but will not adjust based on this level since dosing based on ACTs. Monitoring LDH - peaked at 1500 but now trending down since switching Impella CP to 5.0.  Goal of Therapy:  ACT 160-180 Monitor platelets by anticoagulation protocol: Yes   Plan:  Continue heparin in purge solution and systemic heparin Check ACT and titrate heparin rate per RN protocols Monitor daily heparin level, ACT, CBC Monitor for bleeding  Richardine Service, PharmD PGY1 Pharmacy Resident Phone: [phone] 05/27/2019  1:31 PM  Please check [email or web address] for unit-specific pharmacy phone numbers.

## 2019-05-27 NOTE — Progress Notes (Signed)
  Echocardiogram 2D Echocardiogram has been performed.  Raymond Chavez 05/27/2019, 9:42 AM

## 2019-05-27 NOTE — Evaluation (Signed)
Occupational Therapy Evaluation Patient Details Name: Raymond Chavez MRN: [id number] DOB: October 31, 1950 Today's Date: 05/27/2019    History of Present Illness Pt is a 68 y/o male was was admitted wdith acute hypoxic respiratory failure due to acute pulmonary edema in setting of NSTEMI, cardiogenic shock, possible PNA. Required intubation from 10/22 to 11/1. On impella for hemodynamic support. Started CRRT 10/29.    Clinical Impression   Patient admitted for above and limited by problem list below, including weakness, decreased activity tolerance, R UE edema, impaired balance, and impaired cognition.  Patient currently requires max-total assist for self care tasks, and +4 max assist for bed mobility and basic sit to stand transfer (2 for physical assist and 2 for line mgmt). Patient UE ROM limited by lines and weakness, but noted R UE edema (educated on UE elevation and hand pumping exercises).  Vitals listed below, tolerating sitting EOB with min assist for balance but noted increased RR during sit to stand.  Cognitively, he presents with slow processing, poor attention, memory, problem solving and safety, inconsistency following 1 step commands. He will benefit from continued OT services while admitted and after dc at CIR level when medically appropriate in order to maximize independence with ADls/mobility.    Vitals: Pre-86 BPM, 96% SpO2, 127/80. Post-88 BPM, 97% SpO2, 118/67. RR elevated to 40 during mobility however drops to low 30s and high 20s with 1-2 minutes of resting in supine. Pt with Theone Murdoch, Impella 5.0, and CRRT. Pt on 7L HFNC.   Follow Up Recommendations  CIR;Supervision/Assistance - 24 hour    Equipment Recommendations  Other (comment)(TBD at next venue of care)    Recommendations for Other Services Rehab consult;Speech consult;PT consult     Precautions / Restrictions Precautions Precautions: Fall;Other (comment) Precaution Comments: CRRT, impella 5.0, swan ganz   Restrictions Weight Bearing Restrictions: Yes(RUE impella)      Mobility Bed Mobility Overal bed mobility: Needs Assistance Bed Mobility: Supine to Sit     Supine to sit: (P) Max assist;+2 for physical assistance;+2 for safety/equipment Sit to supine: (P) Max assist;+2 for physical assistance;+2 for safety/equipment   General bed mobility comments: (P) pt initiating mobility of LEs however needing +2 physical assist to scoot at edge and sit up, also +2 assist for line management in addition to 2 for physical support  Transfers Overall transfer level: (P) Needs assistance Equipment used: (P) 2 person hand held assist Transfers: (P) Sit to/from Stand Sit to Stand: (P) Max assist;+2 physical assistance;+2 safety/equipment(2 for physical support and 2 for line management)         General transfer comment: stood from EOB with max assist +2 to power up and steady, with + RN and 2nd PT managing lines     Balance Overall balance assessment: (P) Needs assistance Sitting-balance support: Feet supported;No upper extremity supported Sitting balance-Leahy Scale: Fair Sitting balance - Comments: posterior lean initally, fading to min assist  Postural control: Posterior lean Standing balance support: Bilateral upper extremity supported;During functional activity Standing balance-Leahy Scale: Poor Standing balance comment: relaint on BUE and external support                           ADL either performed or assessed with clinical judgement   ADL Overall ADL's : Needs assistance/impaired  Functional mobility during ADLs: Maximal assistance;Total assistance;+2 for physical assistance;+2 for safety/equipment(limited to EOB ) General ADL Comments: pt requires total assist for all self care at this time due to cognition, weakness, and lines      Vision   Additional Comments: further assessment      Perception     Praxis       Pertinent Vitals/Pain Pain Assessment: Faces Faces Pain Scale: No hurt     Hand Dominance Right   Extremity/Trunk Assessment Upper Extremity Assessment Upper Extremity Assessment: Defer to OT evaluation RUE Deficits / Details: edema, AAROM with limited shoulder ROM to 50-60 degrees, limited by lines   RUE Coordination: decreased fine motor;decreased gross motor LUE Deficits / Details: AAROM with shoulder FF to 60 degrees, limited by lines    Lower Extremity Assessment Lower Extremity Assessment: Generalized weakness(pt with 4-/5 strenght in BLE, sensation appears grossly [place])   Cervical / Trunk Assessment Cervical / Trunk Assessment: Normal   Communication Communication Communication: No difficulties   Cognition Arousal/Alertness: Lethargic Behavior During Therapy: Impulsive Overall Cognitive Status: No family/caregiver present to determine baseline cognitive functioning Area of Impairment: Orientation;Attention;Memory;Following commands;Safety/judgement;Awareness;Problem solving                 Orientation Level: Disoriented to;Situation;Time Current Attention Level: Focused Memory: Decreased short-term memory Following Commands: Follows one step commands inconsistently;Follows one step commands with increased time Safety/Judgement: Decreased awareness of deficits;Decreased awareness of safety Awareness: Intellectual Problem Solving: Slow processing;Decreased initiation;Difficulty sequencing;Requires verbal cues;Requires tactile cues General Comments: pt with inconsistencies following 1 step commands, poor attention and slow processing   General Comments       Exercises     Shoulder Instructions      Home Living Family/patient expects to be discharged to:: Private residence Living Arrangements: Alone Available Help at Discharge: Family;Available PRN/intermittently(sister) Type of Home: Apartment Home Access: (unable to determine)     Home Layout:  (unable to determine)     Bathroom Shower/Tub: (unable to determine)   Bathroom Toilet: (unable to determine)     Home Equipment: (unable to determine)   Additional Comments: pt is a poor historian      Prior Functioning/Environment Level of Independence: (unable to determine, pt reports he does walk)        Comments: anticipate independent, pt unclear with hx/poor historian         OT Problem List: Decreased strength;Decreased range of motion;Decreased activity tolerance;Impaired balance (sitting and/or standing);Decreased coordination;Decreased cognition;Decreased safety awareness;Decreased knowledge of use of DME or AE;Decreased knowledge of precautions;Cardiopulmonary status limiting activity;Impaired UE functional use;Increased edema      OT Treatment/Interventions: Self-care/ADL training;Energy conservation;DME and/or AE instruction;Therapeutic activities;Patient/family education;Balance training;Cognitive remediation/compensation;Therapeutic exercise    OT Goals(Current goals can be found in the care plan section) Acute Rehab OT Goals Patient Stated Goal: none stated today  OT Goal Formulation: Patient unable to participate in goal setting Time For Goal Achievement: 06/10/19 Potential to Achieve Goals: Fair  OT Frequency: Min 2X/week   Barriers to D/C:            Co-evaluation PT/OT/SLP Co-Evaluation/Treatment: Yes Reason for Co-Treatment: Complexity of the patient's impairments (multi-system involvement);Necessary to address cognition/behavior during functional activity;To address functional/ADL transfers PT goals addressed during session: Mobility/safety with mobility;Balance;Strengthening/ROM OT goals addressed during session: ADL's and self-care      AM-PAC OT "6 Clicks" Daily Activity     Outcome Measure Help from another person eating meals?: Total Help from another person taking care of personal grooming?: Total  Help from another person toileting,  which includes using toliet, bedpan, or urinal?: Total Help from another person bathing (including washing, rinsing, drying)?: Total Help from another person to put on and taking off regular upper body clothing?: Total Help from another person to put on and taking off regular lower body clothing?: Total 6 Click Score: 6   End of Session Equipment Utilized During Treatment: Oxygen(8L) Nurse Communication: Mobility status;Precautions  Activity Tolerance: Patient tolerated treatment well Patient left: in bed;with call bell/phone within reach;with nursing/sitter in room  OT Visit Diagnosis: Unsteadiness on feet (R26.81);Other abnormalities of gait and mobility (R26.89);Muscle weakness (generalized) (M62.81);Other symptoms and signs involving cognitive function                Time: 1140-1204 OT Time Calculation (min): 24 min Charges:  OT General Charges $OT Visit: 1 Visit OT Evaluation $OT Eval High Complexity: 1 High  Delight Stare, OT Acute Rehabilitation Services Pager [phone] Office [phone]   Delight Stare 05/27/2019, 12:45 PM

## 2019-05-27 NOTE — Progress Notes (Addendum)
Patient ID: Raymond Chavez, male   DOB: 1950/10/07, 68 y.o.   MRN: [id number]    Advanced Heart Failure Rounding Note   Subjective:    Underwent placement of Impella 5.0 on 10/24 with removal of R femoral Impella CP.   Extubated 11/1, now on HFNC 7L.  Oxygen saturation 94%, was lower earlier today.  He is tachypneic.  Increased pulmonary edema on CXR.  He is now on milrinone 0.25 and norepinephrine 26. Co-ox low this morning in setting of hypoxemia (37%) but CI is 2.4 this morning.   CVVH started on 10/29.  Currently running UF to keep I/Os even.  Minimal urine production, foley now out.  CVP 9-10 today.  Bronchoscopy 10/30 for mucus plugging.   Enterococcus faecalis on BAL culture.  Afebrile, WBCs 30 today, patient is on ampicillin.   Plts 120, HIT negative.  Getting heparin gtt.   Hgb 7.2 today down from 8.2.   Swan #s  PA 37/11 CVP 9-10 CI 2.4  Impella P-7 No flows due to damage of sensor during placement. Motor current stable. LDH 881 => 911 => 593 => 589.   RHC/LHC (10/23):  Coronary Findings  Diagnostic Dominance: Right Left Main  75% distal left main stenosis.  Left Anterior Descending  Occluded proximally, some late filling by collaterals.  Ramus Intermedius  Large vessel, patent with luminal irregularities.  Left Circumflex  80% mid LCx stenosis. Small OM1 with 95% proximal stenosis. Large OM2 is subtotally occluded (fills late with TIMI 2 flow).  Right Coronary Artery  80% proximal stenosis. Serial 50% mid-vessel stenoses. PDA with only luminal irregularities. Large PLV with 80% mid-vessel stenosis.  Intervention  No interventions have been documented. Right Heart  Right Heart Pressures RHC Procedural Findings (mmHg): Hemodynamics RA mean 13 RV 70/17 PA 73/41, mean 53 PCWP mean 30 LV 140/40 AO 138/88  Oxygen saturations: PA 60% AO 97%  Cardiac Output (Fick) 2.95  Cardiac Index (Fick) 1.62 PVR 7.8 WU  Cardiac Output (Thermo) 3.24 Cardiac Index  (Thermo) 1.78  PVR 7.1 WU  PAPI 2.46    Objective:   Weight Range:  Vital Signs:   Temp:  [96.6 F (35.9 C)-98.6 F (37 C)] 97.7 F (36.5 C) (11/02 0700) Pulse Rate:  [74-98] 90 (11/02 0400) Resp:  [13-48] 34 (11/02 0700) BP: (79-119)/(58-86) 96/62 (11/02 0700) SpO2:  [89 %-100 %] 97 % (11/02 0700) Arterial Line BP: (70-127)/(41-71) 97/53 (11/02 0700) FiO2 (%):  [30 %] 30 % (11/01 1101) Weight:  [73 kg] 73 kg (11/02 0329) Last BM Date: 05/20/19  Weight change: Filed Weights   05/25/19 0645 05/26/19 0115 05/27/19 0329  Weight: 74.5 kg 71.8 kg 73 kg    Intake/Output:   Intake/Output Summary (Last 24 hours) at 05/27/2019 0748 Last data filed at 05/27/2019 0700 Gross per 24 hour  Intake 2618.94 ml  Output 2398 ml  Net 220.94 ml     Physical Exam: CVP 9-10  General: Mildly tachypneic.  Neck: JVP 9-10 cm, no thyromegaly or thyroid nodule.  Lungs: Decreased BS bilaterally. CV: Nondisplaced PMI.  Heart regular S1/S2, no S3/S4, no murmur. Impella sounds. No peripheral edema.   Abdomen: Soft, nontender, no hepatosplenomegaly, no distention.  Skin: Intact without lesions or rashes.  Neurologic: Alert and oriented x 3.  Psych: Normal affect. Extremities: No clubbing or cyanosis.  HEENT: Normal.   Telemetry: NSR 90s (personally reviewed)  Labs: Basic Metabolic Panel: Recent Labs  Lab 05/23/19 0259  05/24/19 0420  05/25/19 0341  05/25/19 1641 05/26/19  6789 05/26/19 1831 05/26/19 2115 05/27/19 0309 05/27/19 0323  NA 135   < > 134*   < > 136   < > 136 136 135 135 135 133*  K 4.5   < > 4.3   < > 4.9   < > 4.3 4.0 4.1 4.1 4.4 4.4  CL 102   < > 100   < > 101  --  102 104 103  --  101  --   CO2 20*   < > 22   < > 23  --  23 24 21*  --  21*  --   GLUCOSE 175*   < > 179*   < > 193*  --  116* 119* 125*  --  124*  --   BUN 72*   < > 47*   < > 33*  --  41* 33* 40*  --  36*  --   CREATININE 4.67*   < > 2.72*   < > 1.75*  --  1.67* 1.45* 1.36*  --  1.39*  --   CALCIUM  8.2*   < > 7.6*   < > 8.1*  --  8.1* 7.7* 8.1*  --  7.8*  --   MG 2.0  --  2.4  --  2.5*  --   --  2.4  --   --  2.5*  --   PHOS 4.6   < > 3.1   < > 3.6  --  2.0* 1.5* 1.9*  --  1.8*  --    < > = values in this interval not displayed.    Liver Function Tests: Recent Labs  Lab 05/21/19 0406  05/25/19 0341 05/25/19 1641 05/26/19 0250 05/26/19 1831 05/27/19 0309  AST 22  --   --   --   --   --   --   ALT 12  --   --   --   --   --   --   ALKPHOS 56  --   --   --   --   --   --   BILITOT 1.1  --   --   --   --   --   --   PROT 4.7*  --   --   --   --   --   --   ALBUMIN 2.2*   < > 3.0* 2.8* 2.5* 2.4* 2.3*   < > = values in this interval not displayed.   No results for input(s): LIPASE, AMYLASE in the last 168 hours. No results for input(s): AMMONIA in the last 168 hours.  CBC: Recent Labs  Lab 05/23/19 0259  05/24/19 0420  05/25/19 0341 05/25/19 0350 05/26/19 0250 05/26/19 2115 05/27/19 0309 05/27/19 0323  WBC 17.6*  --  17.9*  --  19.0*  --  17.9*  --  30.0*  --   HGB 8.8*   < > 7.6*   < > 9.9* 10.2* 8.2* 7.8* 7.2* 8.2*  HCT 26.6*   < > 23.2*   < > 30.6* 30.0* 25.4* 23.0* 22.0* 24.0*  MCV 88.4  --  88.2  --  91.1  --  91.7  --  89.8  --   PLT 48*  --  103*  --  109*  --  97*  --  120*  --    < > = values in this interval not displayed.    Cardiac Enzymes: No results for input(s): CKTOTAL, CKMB, CKMBINDEX,  TROPONINI in the last 168 hours.  BNP: BNP (last 3 results) Recent Labs    05/06/2019 1739 05/04/2019 1234  BNP 2,725.3* 3,370.0*    ProBNP (last 3 results) No results for input(s): PROBNP in the last 8760 hours.    Other results:  Imaging: Dg Chest Port 1 View  Result Date: 05/27/2019 CLINICAL DATA:  Impella catheter placement EXAM: PORTABLE CHEST 1 VIEW COMPARISON:  05/25/2019 FINDINGS: The right upper extremity Impella projects over the expected region of the left ventricle. The Swan-Ganz catheter tip projects over the main pulmonary artery. The  left-sided central venous catheter tip projects over the SVC/left brachiocephalic vein. Diffuse hazy bilateral airspace opacities with prominent interstitial lung markings are again noted. The heart size remains enlarged. There is no pneumothorax. IMPRESSION: 1. Lines and tubes as above.  No pneumothorax. 2. Worsening pulmonary edema. Electronically Signed   By: Constance Holster M.D.   On: 05/27/2019 05:49   Dg Chest Port 1 View  Result Date: 05/25/2019 CLINICAL DATA:  Follow-up exam.  CHF. EXAM: PORTABLE CHEST 1 VIEW COMPARISON:  05/24/2019 and older exams. FINDINGS: Bilateral interstitial thickening is similar to the previous day's study. No new lung abnormalities. No areas of consolidation. No convincing pleural effusion and no pneumothorax. Endotracheal tube, nasal/orogastric tube, right internal jugular Swan-Ganz catheter, left internal jugular central venous line and right PICC are stable. Stable left ventricular assist device. IMPRESSION: 1. Bilateral interstitial thickening consistent with mild residual interstitial edema. This is improved compared to 05/23/2019. No new lung abnormalities. 2. Stable support apparatus. Electronically Signed   By: Lajean Manes M.D.   On: 05/25/2019 10:34     Medications:     Scheduled Medications: . sodium chloride   Intravenous Once  . sodium chloride   Intravenous Once  . arformoterol  15 mcg Nebulization BID  . aspirin  81 mg Per Tube Daily  . atorvastatin  80 mg Per Tube q1800  . B-complex with vitamin C  1 tablet Per Tube Daily  . budesonide (PULMICORT) nebulizer solution  0.5 mg Nebulization BID  . chlorhexidine  15 mL Mouth Rinse BID  . Chlorhexidine Gluconate Cloth  6 each Topical Daily  . insulin aspart  1-3 Units Subcutaneous Q4H  . ipratropium  0.5 mg Nebulization BID  . lidocaine-EPINEPHrine  20 mL Infiltration Once  . mouth rinse  15 mL Mouth Rinse q12n4p  . pantoprazole (PROTONIX) IV  40 mg Intravenous QHS  . sodium chloride flush   10-40 mL Intracatheter Q12H  . sodium chloride flush  3 mL Intravenous Q12H    Infusions: .  prismasol BGK 4/2.5 500 mL/hr at 05/27/19 0005  .  prismasol BGK 4/2.5 500 mL/hr at 05/26/19 2310  . sodium chloride 10 mL/hr at 05/20/19 0910  . sodium chloride 10 mL/hr at 05/27/19 0600  . sodium chloride Stopped (05/25/19 1844)  . sodium chloride    . amiodarone 30 mg/hr (05/27/19 0600)  . ampicillin (OMNIPEN) IV 2 g (05/27/19 0600)  . impella catheter heparin 50 unit/mL in dextrose 5% 50,000 Units (05/23/19 0815)  . heparin 1,100 Units/hr (05/27/19 0601)  . milrinone 0.25 mcg/kg/min (05/27/19 0600)  . norepinephrine (LEVOPHED) Adult infusion 26 mcg/min (05/27/19 0600)  . prismasol BGK 4/2.5 2,000 mL/hr at 05/27/19 0326    PRN Medications: sodium chloride, Place/Maintain arterial line **AND** sodium chloride, sodium chloride, docusate, heparin, influenza vaccine adjuvanted, ipratropium-albuterol, pneumococcal 23 valent vaccine, sodium chloride, sodium chloride flush, sodium chloride flush   Assessment/Plan:   1. Shock: Possible  mixed cardiogenic/septic with fever and suspected PNA.  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  Echo with EF 20%, RV moderately HK.  Impella CP placed 10/23. Switched for Impella 5.0 on 10/24. No flow meter on Impella due to damage to sensor on insertion. Patient has LBBB.  On milrinone 0.25 and norepinephrine now significantly higher at 26.  CI ok at 2.4, co-ox low this morning early but oxygen saturation was very low also.  CVP 9-10. CVVH ongoing, Had been aiming to keep I/Os even.   - Continue current milrinone 0.25 and NE to keep MAP up.  - Continue CVVH, with increasing pulmonary edema on CXR and worsening hypoxemia this morning will start UF aiming for 75-100 cc/hr if he tolerates.   - Continue Impella motor current stable this morning (sensor nonfunctional). LDH lower today at 589. Will assess position under echo again today (was  ok yesterday) and have decreased speed to P7 for now due to concern for hemolysis.  He is getting systemic heparin.  2. CAD:  NSTEMI, hs-TnI 16,000. 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  - continue ASA/statin. No b-blocker with shock - CABG with Impella 5.0 support likely best option when able 3. AKI: Baseline creatinine 1.3., now AKI likely due to ATN from shock and contrast (had CTA for PE at admission, only 25 cc contrast with cath). Now on CVVH, little UOP.  -UF 75-100 cc/hr with increasing pulmonary edema and worsening oxygenation.  4. Acute Respiratory Failure: Pulmonary edema + PNA left base. Enterococcus faecalis in BAL cultures. Pulmonary edema worse on CXR and oxygenation worse. He is now extubated, on high flow oxygen 7L => 10L.  -  As above, UF 75-100 cc/hr for now with worsening pulmonary edema.   5. ID: Suspected PNA, CXR with left base density. Enterococcus faecalis in BAL cultures.  Now afebrile, but WBCs up to 30K.  - He is on ampicillin IV now.   6. LBBB: Unsure chronicity.  7. Anemia: Hgb trending down, 7.2.  Transfuse 1 unit now.     8. Thrombocytopenia: Platelets 120K today, HIT negative.  Got 1 unit plts 10/29 pre-line placement.  ?Low due to sepsis versus hemolysis.    9. PVCs/NSVT: Amiodarone gtt. Quiescent.  10. FEN: Tube feeds.   CRITICAL CARE Performed by: Loralie Champagne  Total critical care time: 45 minutes  Critical care time was exclusive of separately billable procedures and treating other patients.  Critical care was necessary to treat or prevent imminent or life-threatening deterioration.  Critical care was time spent personally by me on the following activities: development of treatment plan with patient and/or surrogate as well as nursing, discussions with consultants, evaluation of patient's response to treatment, examination of patient, obtaining history from patient or surrogate, ordering and performing treatments and  interventions, ordering and review of laboratory studies, ordering and review of radiographic studies, pulse oximetry and re-evaluation of patient's condition.  Loralie Champagne 05/27/2019 7:48 AM  Impella assessed under echo, unchanged position at 3.1 cm.  Increased to P8 with improvement in MAP.   This afternoon, co-ox remains low at 33.7% with hgb 8.4 (appropriate increase with 1 unit PRBCs).  CI 1.99 off Swan.  SBP 100s-110s, have been able to wean down norepinephrine to 18.  - Increase milrinone to 0.375 and repeat co-ox.  - Continue Impella at P8.    He is less tachypneic, currently pulling UF 100  cc/hr.  Says breathing is comfortable.  CVP 8-9 with PCWP 19-20 this afternoon.  - Decrease UF to net 50 cc/hr.   Loralie Champagne 05/27/2019 2:35 PM

## 2019-05-27 NOTE — Progress Notes (Signed)
Impella purge cassette changed 05/27/2019. Next date to be changed will be 05/31/2019. Label placed on set.

## 2019-05-27 NOTE — Evaluation (Signed)
Clinical/Bedside Swallow Evaluation Patient Details  Name: Raymond Chavez MRN: [id number] Date of Birth: 1950/12/25  Today's Date: 05/27/2019 Time: SLP Start Time (ACUTE ONLY): 0747 SLP Stop Time (ACUTE ONLY): 0800 SLP Time Calculation (min) (ACUTE ONLY): 13 min  Past Medical History:  Past Medical History:  Diagnosis Date  . Acute respiratory failure with hypoxemia ([place]) 04/2019  . Carotid artery occlusion   . Hypertension    Past Surgical History:  Past Surgical History:  Procedure Laterality Date  . GROIN DISSECTION  06-09-19   Procedure: right femoral artery repair and left heart catheterization;  Surgeon: Wonda Olds, MD;  Location: [facility];  Service: Open Heart Surgery;;  . INSERTION OF IMPLANTABLE LEFT VENTRICULAR ASSIST DEVICE N/A 06-09-19   Procedure: INSERTION OF IMPELLA 5.0 IMPLANTABLE LEFT VENTRICULAR ASSIST DEVICE THROUGH RIGHT AXILLARY; REMOVAL OF RIGHT GROIN IMPELLA CP;  Surgeon: Wonda Olds, MD;  Location: [facility];  Service: Open Heart Surgery;  Laterality: N/A;  . RIGHT/LEFT HEART CATH AND CORONARY ANGIOGRAPHY N/A 05/09/2019   Procedure: RIGHT/LEFT HEART CATH AND CORONARY ANGIOGRAPHY;  Surgeon: Larey Dresser, MD;  Location: [facility];  Service: Cardiovascular;  Laterality: N/A;  . VENTRICULAR ASSIST DEVICE INSERTION N/A 05/06/2019   Procedure: VENTRICULAR ASSIST DEVICE INSERTION;  Surgeon: Larey Dresser, MD;  Location: [facility];  Service: Cardiovascular;  Laterality: N/A;   HPI:  Pt is a 68 y.o. male who was admitted with acute hypoxic respiratory failure due to acute pulmonary edema in setting of NSTEMI, cardiogenic shock, possible PNA. He required intubation in the ED (10/22-11/1). On impella for hemodynamic support; CRRT started 10/29; s/p bronchoscopy 10/30 for mucus plugging. PMH: HTN, CAD   Assessment / Plan / Recommendation Clinical Impression  Pt is alert and with relatively good vocal quality considering length of intubation,  but he is confused and exhibits generalized weakness, still putting him at a higher risk for aspiration. He does not swallow on command and does not consistently elicit a swallow spontaneously when POs are administered. Coughing was noted during oral care despite use of suction. Recommend that he remain NPO for now with SLP f/u for potential to resume POs as he can swallow and/or follow commands more consistently.  SLP Visit Diagnosis: Dysphagia, unspecified (R13.10)    Aspiration Risk  Severe aspiration risk    Diet Recommendation NPO   Medication Administration: Via alternative means    Other  Recommendations Oral Care Recommendations: Oral care QID Other Recommendations: Have oral suction available   Follow up Recommendations (tba)      Frequency and Duration min 2x/week  2 weeks       Prognosis Prognosis for Safe Diet Advancement: Good Barriers to Reach Goals: Cognitive deficits      Swallow Study   General HPI: Pt is a 68 y.o. male who was admitted with acute hypoxic respiratory failure due to acute pulmonary edema in setting of NSTEMI, cardiogenic shock, possible PNA. He required intubation in the ED (10/22-11/1). On impella for hemodynamic support; CRRT started 10/29; s/p bronchoscopy 10/30 for mucus plugging. PMH: HTN, CAD Type of Study: Bedside Swallow Evaluation Previous Swallow Assessment: none in chart Diet Prior to this Study: NPO Temperature Spikes Noted: No Respiratory Status: Nasal cannula History of Recent Intubation: Yes Length of Intubations (days): 10 days Date extubated: 05/26/19 Behavior/Cognition: Alert;Confused;Requires cueing Oral Cavity Assessment: Within Functional Limits Oral Care Completed by SLP: Yes Oral Cavity - Dentition: Poor condition Self-Feeding Abilities: Total assist Patient Positioning: Upright in bed  Baseline Vocal Quality: Normal Volitional Swallow: Unable to elicit    Oral/Motor/Sensory Function Overall Oral Motor/Sensory  Function: Generalized oral weakness   Ice Chips Ice chips: Impaired Presentation: Spoon Oral Phase Functional Implications: Oral holding   Thin Liquid Thin Liquid: Impaired Presentation: Spoon Oral Phase Functional Implications: Oral holding Pharyngeal  Phase Impairments: Cough - Delayed    Nectar Thick Nectar Thick Liquid: Not tested   Honey Thick Honey Thick Liquid: Not tested   Puree Puree: Not tested   Solid     Solid: Not tested      Raymond Chavez 05/27/2019,8:07 AM  Ivar Drape, M.A. CCC-SLP Acute Herbalist [phone] Office [phone]

## 2019-05-27 NOTE — Progress Notes (Signed)
Dr Aundra Dubin paged regarding Coox of 27.7 drawn from PA distal port. Additional Coox drawn from PICC to compare results; resulting at 38. Patient alert to self and time; orientation status has not changed throughout shift. Patient remains hemodynamically stable. Verbal orders received to increase Milrinone to 0.5 mcg/kg/min and to page CCM for any additional input regardingclinical picture. Will continue to monitor closely.

## 2019-05-27 NOTE — Evaluation (Signed)
Physical Therapy Evaluation Patient Details Name: Raymond Chavez MRN: [id number] DOB: 10/11/1950 Today's Date: 05/27/2019   History of Present Illness  Pt is a 68 y/o male was was admitted wdith acute hypoxic respiratory failure due to acute pulmonary edema in setting of NSTEMI, cardiogenic shock, possible PNA. Required intubation from 10/22 to 11/1. On impella for hemodynamic support. Started CRRT 10/29.   Clinical Impression  Pt demonstrates deficits in functional mobility, gait, balance, endurance, strength, power, cardiopulmonary function, and cognition. Pt requiring management of 4 healthcare professionals, 2 for physical assistance and 2 for line management during this session. Pt is grossly weak, needing physical assistance for all functional mobility. Pt tolerates sitting edge of bed well with minA for sitting balance, however demonstrates significant increase in RR during stand and side step at edge of bed. Pt will benefit from continued acute PT services to improve functional mobility and tolerance for activity. PT anticipates the patient will progress to tolerating prolonged periods of physical activity and be a candidate for CIR when medically appropriate for discharge.    Follow Up Recommendations CIR    Equipment Recommendations  (TBD)    Recommendations for Other Services       Precautions / Restrictions Precautions Precautions: Fall;Other (comment) Precaution Comments: CRRT, impella 5.0, swan ganz  Restrictions Weight Bearing Restrictions: Yes(RUE impella)      Mobility  Bed Mobility Overal bed mobility: Needs Assistance Bed Mobility: Supine to Sit     Supine to sit: Max assist;+2 for physical assistance;+2 for safety/equipment Sit to supine: Max assist;+2 for physical assistance;+2 for safety/equipment   General bed mobility comments: pt initiating mobility of LEs however needing +2 physical assist to scoot at edge and sit up, also +2 assist for line management in  addition to 2 for physical support  Transfers Overall transfer level: Needs assistance Equipment used: 2 person hand held assist Transfers: Sit to/from Stand Sit to Stand: Max assist;+2 physical assistance;+2 safety/equipment(2 for physical support and 2 for line management)         General transfer comment: stood from EOB with max assist +2 to power up and steady, with + RN and 2nd PT managing lines   Ambulation/Gait Ambulation/Gait assistance: Max assist;+2 physical assistance;+2 safety/equipment Gait Distance (Feet): 1 Feet Assistive device: 2 person hand held assist Gait Pattern/deviations: Shuffle Gait velocity: reduced Gait velocity interpretation: <1.31 ft/sec, indicative of household ambulator General Gait Details: pt takes one side step at edge of bed toward left side  Stairs            Wheelchair Mobility    Modified Rankin (Stroke Patients Only)       Balance Overall balance assessment: Needs assistance Sitting-balance support: Feet supported;Single extremity supported Sitting balance-Leahy Scale: Fair Sitting balance - Comments: posterior lean, modA-minA Postural control: Posterior lean Standing balance support: Bilateral upper extremity supported;During functional activity Standing balance-Leahy Scale: Poor Standing balance comment: maxA of 2                             Pertinent Vitals/Pain Pain Assessment: Faces Faces Pain Scale: No hurt    Home Living Family/patient expects to be discharged to:: Private residence Living Arrangements: Alone Available Help at Discharge: Family;Available PRN/intermittently(sister) Type of Home: Apartment Home Access: (unable to determine)     Home Layout: (unable to determine) Home Equipment: (unable to determine) Additional Comments: pt is a poor historian    Prior Function Level of Independence: (unable to  determine, pt reports he does walk)         Comments: anticipate independent, pt  unclear with hx/poor historian      Hand Dominance   Dominant Hand: Right    Extremity/Trunk Assessment   Upper Extremity Assessment Upper Extremity Assessment: Defer to OT evaluation RUE Deficits / Details: edema, AAROM with limited shoulder ROM to 50-60 degrees  RUE Coordination: decreased fine motor;decreased gross motor LUE Deficits / Details: AAROM with shoulder FF to 60 degrees, limited by lines     Lower Extremity Assessment Lower Extremity Assessment: Generalized weakness(pt with 4-/5 strenght in BLE, sensation appears grossly [place])    Cervical / Trunk Assessment Cervical / Trunk Assessment: Normal  Communication   Communication: No difficulties  Cognition Arousal/Alertness: Lethargic Behavior During Therapy: Impulsive Overall Cognitive Status: No family/caregiver present to determine baseline cognitive functioning Area of Impairment: Orientation;Attention;Memory;Following commands;Safety/judgement;Awareness;Problem solving                 Orientation Level: Disoriented to;Situation;Time Current Attention Level: Focused Memory: Decreased short-term memory Following Commands: Follows one step commands inconsistently;Follows one step commands with increased time Safety/Judgement: Decreased awareness of deficits;Decreased awareness of safety Awareness: Intellectual Problem Solving: Slow processing;Decreased initiation;Difficulty sequencing;Requires verbal cues;Requires tactile cues General Comments: pt with inconsistencies following 1 step commands, poor attention and slow processing      General Comments General comments (skin integrity, edema, etc.): Vitals: Pre-86 BPM, 96% SpO2, 127/80. Post-88 BPM, 97% SpO2, 118/67. RR elevated to 40 during mobility however drops to low 30s and high 20s with 1-2 minutes of resting in supine. Pt with Theone Murdoch, Impella 5.0, and CRRT. Nurse and additional PT monitoring lines in addition to PT and OT assisting with mobility. Pt on  7L HFNC/    Exercises     Assessment/Plan    PT Assessment Patient needs continued PT services  PT Problem List Decreased strength;Decreased activity tolerance;Decreased balance;Decreased mobility;Decreased cognition;Decreased knowledge of use of DME;Decreased safety awareness;Decreased knowledge of precautions;Cardiopulmonary status limiting activity       PT Treatment Interventions DME instruction;Gait training;Stair training;Functional mobility training;Therapeutic activities;Therapeutic exercise;Balance training;Neuromuscular re-education;Patient/family education    PT Goals (Current goals can be found in the Care Plan section)  Acute Rehab PT Goals Patient Stated Goal: none stated today  PT Goal Formulation: With patient Time For Goal Achievement: 06/10/19 Potential to Achieve Goals: Fair    Frequency Min 3X/week   Barriers to discharge        Co-evaluation PT/OT/SLP Co-Evaluation/Treatment: Yes Reason for Co-Treatment: Complexity of the patient's impairments (multi-system involvement);Necessary to address cognition/behavior during functional activity;To address functional/ADL transfers PT goals addressed during session: Mobility/safety with mobility;Balance;Strengthening/ROM OT goals addressed during session: ADL's and self-care       AM-PAC PT "6 Clicks" Mobility  Outcome Measure Help needed turning from your back to your side while in a flat bed without using bedrails?: A Lot Help needed moving from lying on your back to sitting on the side of a flat bed without using bedrails?: Total Help needed moving to and from a bed to a chair (including a wheelchair)?: Total Help needed standing up from a chair using your arms (e.g., wheelchair or bedside chair)?: Total Help needed to walk in hospital room?: Total Help needed climbing 3-5 steps with a railing? : Total 6 Click Score: 7    End of Session Equipment Utilized During Treatment: Oxygen Activity Tolerance: Patient  tolerated treatment well Patient left: in bed;with call bell/phone within reach;with bed alarm set;with nursing/sitter in  room Nurse Communication: Mobility status PT Visit Diagnosis: Muscle weakness (generalized) (M62.81)    Time: 6213-0865 PT Time Calculation (min) (ACUTE ONLY): 27 min   Charges:   PT Evaluation $PT Eval High Complexity: 1 High          Zenaida Niece, PT, DPT Acute Rehabilitation Pager: [phone]   Zenaida Niece 05/27/2019, 12:48 PM

## 2019-05-27 NOTE — Progress Notes (Signed)
NAME:  Raymond Chavez, MRN:  [id number], DOB:  Mar 15, 1951, LOS: 35 ADMISSION DATE:  05/15/2019, CONSULTATION DATE:  05/25/2019 REFERRING MD:  Raymond Chavez  CHIEF COMPLAINT:  SOB   Brief History   Raymond Chavez is a 68 y.o. male who was admitted with acute hypoxic respiratory failure due to acute pulmonary edema in setting of NSTEMI, cardiogenic shock, possible PNA, He required intubation in the ED after failing BiPAP. On impella for hemodynamic support CVTS and cardiology are discussing about CABG, LVAD  Past Medical History  HTN, CAD.  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  10/27-heparin stopped due to low platelets.  HIT panel sent.  Starting bivalirudin.  Off Levophed.  On minimal vent support but failed weaning trials 10/28 Bronchoscopy  10/29 Started on CRRT 10/30 Bronchoscopy with mucous plug removal 11/1 Extubated  Consults:  Cardiology PCCM Nephrology   Procedures:  ETT 10/22 >11/1 Swan 10/22 > Impella 10/23 >> 10/24 Changed to 5.0 via right axillary >>  Vas cath 10/29   Significant Diagnostic Tests:  CTA chest 10/22 > no PE, b/l basilar consolidation, diffuse interlobular septal thickening with GGO's, b/l hilar adenopathy. Echo 10/23 > severely decreased LV function.  Cannot estimate EF  Micro Data:  Blood 10/22, 10/24, 10/28, 10/29 >>> NGTD Sputum 10/24 >>> Neg Urine 10/22 >>>Neg Urine Strep > Neg RVP 10/22 > Neg MRSA PCR > Neg SARS CoV2 10/22 > neg. BAL 10/28 > Enterococcus faecalis  Antimicrobials:  Vanc 10/22 >10/25, 10/28>10/31 Cefepime 10/22 >10/26, 10/29>11/1 Ampicillin 11/1>  Interim history/subjective:  Extubated yesterday. On 7L and appears comfortable. Increased pressor requirement from overnight.  Objective:  Blood pressure 96/62, pulse 90, temperature 97.7 F  (36.5 C), resp. rate (!) 34, height _0  (1.702 m), weight 73 kg, SpO2 97 %. PAP: (26-68)/(8-34) 42/15 CVP:  [4 mmHg-11 mmHg] 4 mmHg CO:  [3.9 L/min-5.6 L/min] 3.9 L/min CI:  [2.1 L/min/m2-3 L/min/m2] 2.1 L/min/m2  Vent Mode: CPAP;PSV FiO2 (%):  [30 %] 30 % PEEP:  [5 cmH20-8 cmH20] 5 cmH20 Pressure Support:  [5 cmH20-8 cmH20] 5 cmH20   Intake/Output Summary (Last 24 hours) at 05/27/2019 0741 Last data filed at 05/27/2019 0700 Gross per 24 hour  Intake 2618.94 ml  Output 2398 ml  Net 220.94 ml   Filed Weights   05/25/19 0645 05/26/19 0115 05/27/19 0329  Weight: 74.5 kg 71.8 kg 73 kg   Physical Exam: General: Chronically ill-appearing, no acute distress HENT: [place], AT, OP clear, MMM Eyes: EOMI, no scleral icterus Respiratory: Clear to auscultation bilaterally.  No crackles, wheezing or rales Cardiovascular: Continuous hum GI: BS+, soft, nontender Extremities:-Edema,-tenderness Neuro: Awake and alert, CNII-XII grossly intact  Assessment & Plan:   Acute hypoxic respiratory failure requiring intubation - multifactorial due to LLL PNA, acute pulmonary edema in setting of cardiogenic shock  Extubated 11/2 and tolerating [place]. 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  83/25 RHC/LHC which showed severe 3V CAD (LM 75%, LAD 100%, LCX 80%, RCA 80% prox) and severely depressed LV function -Increased vasopressor support Plan Cardiology/CT Surgery Following >> ??LVAD vs CABG  Continue milrinone, amiodarone Continue  Impella and heparin gtt Continue levophed for MAP goal >65 Continue preadmission ASA Hold preadmission amlodipine, atorvastatin, HCTZ, lisinopril CRRT for volume removeal  Co-comitant Septic Shock secondary to Enterococcus Faecalis PNA Increasing leukocytosis and increased  pressor requirement though that may be related to worsening cardiogenic shock based on recent co-ox. Leukocytosis could be transient from ?aspiration while extubating Plan Continue pressor support as above  De-escalated antibiotics to Ampicillin based on senstivities If clinically decompensates or fevers, re-broaden antibiotics and re-culture  Acute Kidney Injury  Plan Nephrology following CRRT for volume removal  Thrombocytopenia HIT panel negative Plan Heparin gtt  Hypophosphatemia Plan Replete  Best Practice:  Diet: Tube feeds Pain/Anxiety/Delirium protocol: -- VAP protocol: -- DVT prophylaxis: SCD's /bivalirudin GI prophylaxis: PPI. Glucose control: SSI. Mobility: Bedrest. Code Status: Full. Family Communication: Updated per primary team. CCM last updated daughter on 11/1 at bedside Disposition: ICU.  Labs   CBC: Recent Labs  Lab 05/23/19 0259  05/24/19 0420  05/25/19 0341 05/25/19 0350 05/26/19 0250 05/26/19 2115 05/27/19 0309 05/27/19 0323  WBC 17.6*  --  17.9*  --  19.0*  --  17.9*  --  30.0*  --   HGB 8.8*   < > 7.6*   < > 9.9* 10.2* 8.2* 7.8* 7.2* 8.2*  HCT 26.6*   < > 23.2*   < > 30.6* 30.0* 25.4* 23.0* 22.0* 24.0*  MCV 88.4  --  88.2  --  91.1  --  91.7  --  89.8  --   PLT 48*  --  103*  --  109*  --  97*  --  120*  --    < > = values in this interval not displayed.   Basic Metabolic Panel: Recent Labs  Lab 05/23/19 0259  05/24/19 0420  05/25/19 0341  05/25/19 1641 05/26/19 0250 05/26/19 1831 05/26/19 2115 05/27/19 0309 05/27/19 0323  NA 135   < > 134*   < > 136   < > 136 136 135 135 135 133*  K 4.5   < > 4.3   < > 4.9   < > 4.3 4.0 4.1 4.1 4.4 4.4  CL 102   < > 100   < > 101  --  102 104 103  --  101  --   CO2 20*   < > 22   < > 23  --  23 24 21*  --  21*  --   GLUCOSE 175*   < > 179*   < > 193*  --  116* 119* 125*  --  124*  --   BUN 72*   < > 47*   < > 33*  --  41* 33* 40*  --  36*  --   CREATININE 4.67*   < > 2.72*   < > 1.75*  --   1.67* 1.45* 1.36*  --  1.39*  --   CALCIUM 8.2*   < > 7.6*   < > 8.1*  --  8.1* 7.7* 8.1*  --  7.8*  --   MG 2.0  --  2.4  --  2.5*  --   --  2.4  --   --  2.5*  --   PHOS 4.6   < > 3.1   < > 3.6  --  2.0* 1.5* 1.9*  --  1.8*  --    < > = values in this interval not displayed.   GFR: Estimated Creatinine Clearance: 46.9 mL/min (A) (by C-G  formula based on SCr of 1.39 mg/dL (H)). Recent Labs  Lab 05/21/19 1045  05/23/19 1217 05/24/19 0420 05/25/19 0341 05/26/19 0250 05/27/19 0309  PROCALCITON 0.98  --  0.72 0.40 0.45  --   --   WBC  --    < >  --  17.9* 19.0* 17.9* 30.0*   < > = values in this interval not displayed.   Liver Function Tests: Recent Labs  Lab 05/21/19 0406  05/25/19 0341 05/25/19 1641 05/26/19 0250 05/26/19 1831 05/27/19 0309  AST 22  --   --   --   --   --   --   ALT 12  --   --   --   --   --   --   ALKPHOS 56  --   --   --   --   --   --   BILITOT 1.1  --   --   --   --   --   --   PROT 4.7*  --   --   --   --   --   --   ALBUMIN 2.2*   < > 3.0* 2.8* 2.5* 2.4* 2.3*   < > = values in this interval not displayed.   No results for input(s): LIPASE, AMYLASE in the last 168 hours. No results for input(s): AMMONIA in the last 168 hours. ABG    Component Value Date/Time   PHART 7.585 (H) 05/27/2019 0323   PCO2ART 23.8 (L) 05/27/2019 0323   PO2ART 66.0 (L) 05/27/2019 0323   HCO3 22.6 05/27/2019 0323   TCO2 23 05/27/2019 0323   ACIDBASEDEF 5.3 (H) 05/23/2019 0335   O2SAT 37.4 05/27/2019 0330    Coagulation Profile: No results for input(s): INR, PROTIME in the last 168 hours. Cardiac Enzymes: No results for input(s): CKTOTAL, CKMB, CKMBINDEX, TROPONINI in the last 168 hours. HbA1C: Hgb A1c MFr Bld  Date/Time Value Ref Range Status  05/12/2019 11:25 PM 5.5 4.8 - 5.6 % Final    Comment:    (NOTE) Pre diabetes:          5.7%-6.4% Diabetes:              >6.4% Glycemic control for   <7.0% adults with diabetes    CBG: Recent Labs  Lab 05/26/19  1207 05/26/19 1611 05/26/19 2019 05/27/19 0011 05/27/19 0315  GLUCAP 159* 135* 117* 116* 134*    Critical care time: 35 min   The patient is critically ill with multiple organ systems failure and requires high complexity decision making for assessment and support, frequent evaluation and titration of therapies, application of advanced monitoring technologies and extensive interpretation of multiple databases.   Critical Care Time devoted to patient care services described in this note is 35 Minutes.   Rodman Pickle, M.D. [facility] Pulmonary/Critical Care Medicine 05/27/2019 7:42 AM  Pager: [phone] After hours pager: [phone]

## 2019-05-28 ENCOUNTER — Inpatient Hospital Stay (HOSPITAL_COMMUNITY): Payer: PPO

## 2019-05-28 ENCOUNTER — Other Ambulatory Visit: Payer: Self-pay | Admitting: *Deleted

## 2019-05-28 DIAGNOSIS — I2510 Atherosclerotic heart disease of native coronary artery without angina pectoris: Secondary | ICD-10-CM

## 2019-05-28 LAB — RENAL FUNCTION PANEL
Albumin: 2.2 g/dL — ABNORMAL LOW (ref 3.5–5.0)
Albumin: 1.9 g/dL — ABNORMAL LOW (ref 3.5–5.0)
Anion gap: 13 (ref 5–15)
Anion gap: 13 (ref 5–15)
BUN: 30 mg/dL — ABNORMAL HIGH (ref 8–23)
BUN: 52 mg/dL — ABNORMAL HIGH (ref 8–23)
CO2: 24 mmol/L (ref 22–32)
CO2: 23 mmol/L (ref 22–32)
Calcium: 8 mg/dL — ABNORMAL LOW (ref 8.9–10.3)
Calcium: 7.6 mg/dL — ABNORMAL LOW (ref 8.9–10.3)
Chloride: 99 mmol/L (ref 98–111)
Chloride: 100 mmol/L (ref 98–111)
Creatinine, Ser: 1.36 mg/dL — ABNORMAL HIGH (ref 0.61–1.24)
Creatinine, Ser: 2.16 mg/dL — ABNORMAL HIGH (ref 0.61–1.24)
GFR calc Af Amer: >60 mL/min (ref >60)
GFR calc Af Amer: 35 mL/min — ABNORMAL LOW (ref >60)
GFR calc non Af Amer: 53 mL/min — ABNORMAL LOW (ref >60)
GFR calc non Af Amer: 30 mL/min — ABNORMAL LOW (ref >60)
Glucose, Bld: 134 mg/dL — ABNORMAL HIGH (ref 70–99)
Glucose, Bld: 140 mg/dL — ABNORMAL HIGH (ref 70–99)
Phosphorus: 2.1 mg/dL — ABNORMAL LOW (ref 2.5–4.6)
Phosphorus: 4.5 mg/dL (ref 2.5–4.6)
Potassium: 4.1 mmol/L (ref 3.5–5.1)
Potassium: 4.1 mmol/L (ref 3.5–5.1)
Sodium: 136 mmol/L (ref 135–145)
Sodium: 136 mmol/L (ref 135–145)

## 2019-05-28 LAB — COOXEMETRY PANEL
Carboxyhemoglobin: 1.8 % — ABNORMAL HIGH (ref 0.5–1.5)
Methemoglobin: 0.7 % (ref 0.0–1.5)
O2 Saturation: 55.4 %
Total hemoglobin: 9 g/dL — ABNORMAL LOW (ref 12.0–16.0)

## 2019-05-28 LAB — CBC WITH DIFFERENTIAL/PLATELET
Abs Immature Granulocytes: 0.57 10*3/uL — ABNORMAL HIGH (ref 0.00–0.07)
Basophils Absolute: 0.1 10*3/uL (ref 0.0–0.1)
Basophils Relative: 0 %
Eosinophils Absolute: 0.2 10*3/uL (ref 0.0–0.5)
Eosinophils Relative: 1 %
HCT: 26.8 % — ABNORMAL LOW (ref 39.0–52.0)
Hemoglobin: 8.7 g/dL — ABNORMAL LOW (ref 13.0–17.0)
Immature Granulocytes: 2 %
Lymphocytes Relative: 13 %
Lymphs Abs: 3.8 10*3/uL (ref 0.7–4.0)
MCH: 29.5 pg (ref 26.0–34.0)
MCHC: 32.5 g/dL (ref 30.0–36.0)
MCV: 90.8 fL (ref 80.0–100.0)
Monocytes Absolute: 1.8 10*3/uL — ABNORMAL HIGH (ref 0.1–1.0)
Monocytes Relative: 6 %
Neutro Abs: 22.8 10*3/uL — ABNORMAL HIGH (ref 1.7–7.7)
Neutrophils Relative %: 78 %
Platelets: 93 10*3/uL — ABNORMAL LOW (ref 150–400)
RBC: 2.95 MIL/uL — ABNORMAL LOW (ref 4.22–5.81)
RDW: 17.2 % — ABNORMAL HIGH (ref 11.5–15.5)
WBC: 29.2 10*3/uL — ABNORMAL HIGH (ref 4.0–10.5)
nRBC: 0.7 % — ABNORMAL HIGH (ref 0.0–0.2)

## 2019-05-28 LAB — HEMOGLOBIN AND HEMATOCRIT, BLOOD
HCT: 22.3 % — ABNORMAL LOW (ref 39.0–52.0)
HCT: 23.5 % — ABNORMAL LOW (ref 39.0–52.0)
Hemoglobin: 7.4 g/dL — ABNORMAL LOW (ref 13.0–17.0)
Hemoglobin: 8.2 g/dL — ABNORMAL LOW (ref 13.0–17.0)

## 2019-05-28 LAB — PHOSPHORUS: Phosphorus: 2.1 mg/dL — ABNORMAL LOW (ref 2.5–4.6)

## 2019-05-28 LAB — CBC
HCT: 26.3 % — ABNORMAL LOW (ref 39.0–52.0)
Hemoglobin: 8.7 g/dL — ABNORMAL LOW (ref 13.0–17.0)
MCH: 29.6 pg (ref 26.0–34.0)
MCHC: 33.1 g/dL (ref 30.0–36.0)
MCV: 89.5 fL (ref 80.0–100.0)
Platelets: 94 10*3/uL — ABNORMAL LOW (ref 150–400)
RBC: 2.94 MIL/uL — ABNORMAL LOW (ref 4.22–5.81)
RDW: 17.1 % — ABNORMAL HIGH (ref 11.5–15.5)
WBC: 29.7 10*3/uL — ABNORMAL HIGH (ref 4.0–10.5)
nRBC: 0.5 % — ABNORMAL HIGH (ref 0.0–0.2)

## 2019-05-28 LAB — TYPE AND SCREEN
ABO/RH(D): O POS
Antibody Screen: NEGATIVE
Unit division: 0
Unit division: 0
Unit division: 0

## 2019-05-28 LAB — POCT I-STAT 7, (LYTES, BLD GAS, ICA,H+H)
Acid-Base Excess: 4 mmol/L — ABNORMAL HIGH (ref 0.0–2.0)
Bicarbonate: 27.1 mmol/L (ref 20.0–28.0)
Calcium, Ion: 1.1 mmol/L — ABNORMAL LOW (ref 1.15–1.40)
HCT: 25 % — ABNORMAL LOW (ref 39.0–52.0)
Hemoglobin: 8.5 g/dL — ABNORMAL LOW (ref 13.0–17.0)
O2 Saturation: 96 %
Patient temperature: 36.5
Potassium: 4.2 mmol/L (ref 3.5–5.1)
Sodium: 138 mmol/L (ref 135–145)
TCO2: 28 mmol/L (ref 22–32)
pCO2 arterial: 31.4 mmHg — ABNORMAL LOW (ref 32.0–48.0)
pH, Arterial: 7.543 — ABNORMAL HIGH (ref 7.350–7.450)
pO2, Arterial: 70 mmHg — ABNORMAL LOW (ref 83.0–108.0)

## 2019-05-28 LAB — POCT ACTIVATED CLOTTING TIME
Activated Clotting Time: 158 seconds
Activated Clotting Time: 158 seconds
Activated Clotting Time: 158 seconds
Activated Clotting Time: 158 seconds
Activated Clotting Time: 158 seconds
Activated Clotting Time: 164 seconds
Activated Clotting Time: 164 seconds
Activated Clotting Time: 164 seconds
Activated Clotting Time: 164 seconds
Activated Clotting Time: 169 seconds

## 2019-05-28 LAB — BPAM RBC
Blood Product Expiration Date: 202011262359
Blood Product Expiration Date: 202012022359
Blood Product Expiration Date: 202012032359
ISSUE DATE / TIME: 202010301113
ISSUE DATE / TIME: 202011020808
ISSUE DATE / TIME: 202011022245
Unit Type and Rh: 5100
Unit Type and Rh: 5100
Unit Type and Rh: 5100

## 2019-05-28 LAB — PREPARE RBC (CROSSMATCH)

## 2019-05-28 LAB — HEPARIN LEVEL (UNFRACTIONATED): Heparin Unfractionated: 0.53 IU/mL (ref 0.30–0.70)

## 2019-05-28 LAB — GLUCOSE, CAPILLARY
Glucose-Capillary: 111 mg/dL — ABNORMAL HIGH (ref 70–99)
Glucose-Capillary: 111 mg/dL — ABNORMAL HIGH (ref 70–99)
Glucose-Capillary: 117 mg/dL — ABNORMAL HIGH (ref 70–99)
Glucose-Capillary: 126 mg/dL — ABNORMAL HIGH (ref 70–99)
Glucose-Capillary: 126 mg/dL — ABNORMAL HIGH (ref 70–99)
Glucose-Capillary: 131 mg/dL — ABNORMAL HIGH (ref 70–99)
Glucose-Capillary: 140 mg/dL — ABNORMAL HIGH (ref 70–99)

## 2019-05-28 LAB — CULTURE, BLOOD (ROUTINE X 2)
Culture: NO GROWTH
Culture: NO GROWTH
Special Requests: ADEQUATE
Special Requests: ADEQUATE

## 2019-05-28 LAB — PROCALCITONIN: Procalcitonin: 1.26 ng/mL

## 2019-05-28 LAB — MAGNESIUM: Magnesium: 2.6 mg/dL — ABNORMAL HIGH (ref 1.7–2.4)

## 2019-05-28 LAB — LACTATE DEHYDROGENASE: LDH: 718 U/L — ABNORMAL HIGH (ref 98–192)

## 2019-05-28 MED ORDER — SENNOSIDES-DOCUSATE SODIUM 8.6-50 MG PO TABS
1.0000 | ORAL_TABLET | Freq: Two times a day (BID) | ORAL | Status: DC
  Administered 2019-05-28 – 2019-05-31 (×3): 1 via ORAL
  Filled 2019-05-28 (×3): qty 1

## 2019-05-28 MED ORDER — SODIUM PHOSPHATES 45 MMOLE/15ML IV SOLN
30.0000 mmol | Freq: Once | INTRAVENOUS | Status: AC
  Administered 2019-05-28: 30 mmol via INTRAVENOUS
  Filled 2019-05-28: qty 10

## 2019-05-28 MED ORDER — ADULT MULTIVITAMIN W/MINERALS CH
1.0000 | ORAL_TABLET | Freq: Every day | ORAL | Status: DC
  Administered 2019-05-28: 1 via ORAL
  Filled 2019-05-28: qty 1

## 2019-05-28 MED ORDER — METOCLOPRAMIDE HCL 5 MG/ML IJ SOLN
5.0000 mg | Freq: Three times a day (TID) | INTRAMUSCULAR | Status: DC
  Administered 2019-05-28 – 2019-05-31 (×7): 5 mg via INTRAVENOUS
  Filled 2019-05-28 (×6): qty 2

## 2019-05-28 MED ORDER — PANTOPRAZOLE SODIUM 40 MG IV SOLR
40.0000 mg | Freq: Two times a day (BID) | INTRAVENOUS | Status: DC
  Administered 2019-05-28 (×2): 40 mg via INTRAVENOUS
  Filled 2019-05-28 (×2): qty 40

## 2019-05-28 MED ORDER — SODIUM CHLORIDE 0.9% IV SOLUTION
Freq: Once | INTRAVENOUS | Status: AC
  Administered 2019-05-28: 14:00:00 via INTRAVENOUS

## 2019-05-28 MED ORDER — PRO-STAT SUGAR FREE PO LIQD
30.0000 mL | Freq: Two times a day (BID) | ORAL | Status: DC
  Administered 2019-05-28: 30 mL via ORAL
  Filled 2019-05-28: qty 30

## 2019-05-28 MED ORDER — ENSURE ENLIVE PO LIQD
237.0000 mL | Freq: Three times a day (TID) | ORAL | Status: DC
  Administered 2019-05-28: 237 mL via ORAL

## 2019-05-28 NOTE — Progress Notes (Signed)
1245: Patient had large amount of vomiting after drinking part of his Ensure. Emesis had several blood clots in it CCM made aware. Dr Ruthann Cancer ordered an H&H which resulted with a hemoglobin of 7.4 from 8.5 at 0400 this morning. 1 unit of RBC'S ordered. Dr Aundra Dubin made aware of events and additionally ordered 40 of Protonix BID, along with a KUB and to replace arterial line due to inconsistency. Will continue to monitor closely.

## 2019-05-28 NOTE — Progress Notes (Signed)
Nutrition Follow-up   DOCUMENTATION CODES:   Not applicable  INTERVENTION:   Monitor for BM- day 11 without   Ensure Enlive po TID, each supplement provides 350 kcal and 20 grams of protein  Transition B complex with Vitamin C to MVI daily  NUTRITION DIAGNOSIS:   Inadequate oral intake related to inability to eat as evidenced by NPO status.  Ongoing  GOAL:   Patient will meet greater than or equal to 90% of their needs   Addressed via TF  MONITOR:   Vent status, Labs, Weight trends, Skin, I & O's  REASON FOR ASSESSMENT:   Ventilator    ASSESSMENT:   68 year old male who presented to the ED on 10/22 with chest pain and SOB. PMH of CAD, HTN, MI in 1998. Pt admitted with acute hypoxic respiratory failure due to acute pulmonary edema, PNA, possible ACS/NSTEMI. Pt required intubation.   10/23- s/p right/left heart cath 10/24- impella changed from femoral to R axillary 10/28- s/p bronchoscopy  10/29- vas cath  11/1- extubated   Mental status improved. Diet advanced to full liquids with plan to advance to regular diet after a couple of meals. Being kept even on CRRT this am. Remains anuric. Plan to hold CRRT and monitor for renal recovery. Still no BM (day 11 without). Will speak with RN regarding bowel regimen.   Admission weight: 72.6 kg  Current weight: 72 kg    I/O: +4,771 ml since admit UOP: 0 ml x 24 hrs  CRRT: 3,686 ml x 24 hrs   Drips: amiodarone, milrinone, levophed, sodium phosphate Medications: b complex with vit C, SS novolog  Labs: Cr 1.36-slightly elevated Phosphorus 2.1 (L) Mg 2.6 (H) CBG wdl  Diet Order:   Diet Order            Diet full liquid Room service appropriate? Yes; Fluid consistency: Thin  Diet effective now              EDUCATION NEEDS:   No education needs have been identified at this time  Skin:  Skin Assessment: Skin Integrity Issues: Skin Integrity Issues:: Incisions Incisions: R chest/R groin  Last BM:   PTA  Height:   Ht Readings from Last 1 Encounters:  05/12/2019 5\' 7"  (1.702 m)    Weight:   Wt Readings from Last 1 Encounters:  05/28/19 72 kg    Ideal Body Weight:  67.3 kg  BMI:  Body mass index is 24.86 kg/m.  Estimated Nutritional Needs:   Kcal:  2200-2400 kcal  Protein:  120-135 grams  Fluid:  >/= 2 L/day  Mariana Single RD, LDN Clinical Nutrition Pager # - [phone]

## 2019-05-28 NOTE — Progress Notes (Signed)
eLink Physician-Brief Progress Note Patient Name: Raymond Chavez DOB: 07-20-1951 MRN: [id number]   Date of Service  05/28/2019  HPI/Events of Note  Hypophosphatemia - PO4---  =  2.1 and Creatinine = 1.36.   eICU Interventions  Will order: 1. Replace PO4---. 2. Repeat Phosphorus level at 1 PM.      Intervention Category Major Interventions: Electrolyte abnormality - evaluation and management  Lathen Seal Eugene 05/28/2019, 4:32 AM

## 2019-05-28 NOTE — Procedures (Signed)
Arterial Catheter Insertion Procedure Note Raymond Chavez [id number] June 25, 1951  Procedure: Insertion of Arterial Catheter  Indications: Blood pressure monitoring and Frequent blood sampling  Procedure Details Consent: Risks of procedure as well as the alternatives and risks of each were explained to the (patient/caregiver).  Consent for procedure obtained. Time Out: Verified patient identification, verified procedure, site/side was marked, verified correct patient position, special equipment/implants available, medications/allergies/relevent history reviewed, required imaging and test results available.  Performed  Maximum sterile technique was used including antiseptics, cap, gloves, gown, hand hygiene, mask and sheet. Skin prep: Chlorhexidine; local anesthetic administered 20 gauge catheter was inserted into left radial artery using the Seldinger technique. ULTRASOUND GUIDANCE USED: NO Evaluation Blood flow good; BP tracing good. Complications: No apparent complications.   Judith Part 05/28/2019

## 2019-05-28 NOTE — Progress Notes (Signed)
  Speech Language Pathology Treatment: Dysphagia  Patient Details Name: Raymond Chavez MRN: [id number] DOB: 03-27-1951 Today's Date: 05/28/2019 Time: 1000-1015 SLP Time Calculation (min) (ACUTE ONLY): 15 min  Assessment / Plan / Recommendation Clinical Impression  Pt much more alert and appropriate today, able to follow all commands, vocal quality clear. Consumed 3 oz of water consecutively x2 without coughing. Pt does have poor dentition and dry mouth and needed a liquid wash to finish graham cracker. Recommend starting a full liquid diet and advancing to regular after 1-2 meals. No SLP f/u needed will sign off.   HPI HPI: Pt is a 68 y.o. male who was admitted with acute hypoxic respiratory failure due to acute pulmonary edema in setting of NSTEMI, cardiogenic shock, possible PNA. He required intubation in the ED (10/22-11/1). On impella for hemodynamic support; CRRT started 10/29; s/p bronchoscopy 10/30 for mucus plugging. PMH: HTN, CAD      SLP Plan  All goals met       Recommendations  Diet recommendations: Thin liquid(Advance to regular after 1-2 meals) Liquids provided via: Cup;Straw Medication Administration: Via alternative means Supervision: Patient able to self feed;Staff to assist with self feeding Compensations: Slow rate;Small sips/bites Postural Changes and/or Swallow Maneuvers: Seated upright 90 degrees                Plan: All goals met       GO               Herbie Baltimore, MA CCC-SLP  Acute Rehabilitation Services Pager [phone] Office [phone]  Lynann Beaver 05/28/2019, 10:34 AM

## 2019-05-28 NOTE — Progress Notes (Signed)
ANTICOAGULATION CONSULT NOTE - Follow up Consult  Pharmacy Consult for heparin Indication: Impella 5.0  No Known Allergies  Patient Measurements: Height: 5\' 7"  (170.2 cm)(measured x3) Weight: 158 lb 11.7 oz (72 kg) IBW/kg (Calculated) : 66.1  Vital Signs: Temp: 98.2 F (36.8 C) (11/03 0800) Temp Source: Core (11/03 0800) BP: 89/65 (11/03 0800) Pulse Rate: 82 (11/03 0800)  Labs: Recent Labs    05/26/19 0250  05/27/19 0309  05/27/19 1414 05/27/19 1651 05/28/19 0341 05/28/19 0342 05/28/19 0403  HGB 8.2*   < > 7.2*   < > 8.1* 7.7* 8.7*  --  8.5*  HCT 25.4*   < > 22.0*   < > 24.3* 22.8* 26.3*  --  25.0*  PLT 97*  --  120*  --   --  95* 94*  --   --   HEPARINUNFRC 0.96*  --  1.04*  --   --   --   --  0.53  --   CREATININE 1.45*   < > 1.39*  --  1.31*  --   --  1.36*  --    < > = values in this interval not displayed.    Estimated Creatinine Clearance: 47.9 mL/min (A) (by C-G formula based on SCr of 1.36 mg/dL (H)).   Medical History: Past Medical History:  Diagnosis Date  . Acute respiratory failure with hypoxemia (HCC) 04/2019  . Carotid artery occlusion   . Hypertension     Medications:  Scheduled:  . sodium chloride   Intravenous Once  . sodium chloride   Intravenous Once  . arformoterol  15 mcg Nebulization BID  . aspirin  81 mg Per Tube Daily  . atorvastatin  80 mg Per Tube q1800  . B-complex with vitamin C  1 tablet Per Tube Daily  . chlorhexidine  15 mL Mouth Rinse BID  . Chlorhexidine Gluconate Cloth  6 each Topical Daily  . insulin aspart  1-3 Units Subcutaneous Q4H  . ipratropium  0.5 mg Nebulization BID  . lidocaine-EPINEPHrine  20 mL Infiltration Once  . mouth rinse  15 mL Mouth Rinse q12n4p  . pantoprazole (PROTONIX) IV  40 mg Intravenous QHS  . sodium chloride flush  10-40 mL Intracatheter Q12H  . sodium chloride flush  3 mL Intravenous Q12H   Infusions:  . sodium chloride 10 mL/hr at 05/20/19 0910  . sodium chloride 10 mL/hr at 05/28/19  0800  . sodium chloride Stopped (05/28/19 0127)  . sodium chloride    . amiodarone 30 mg/hr (05/28/19 0800)  . ampicillin (OMNIPEN) IV Stopped (05/28/19 0525)  . impella catheter heparin 50 unit/mL in dextrose 5% 50,000 Units (05/23/19 0815)  . heparin 1,300 Units/hr (05/28/19 0800)  . milrinone 0.5 mcg/kg/min (05/28/19 0936)  . norepinephrine (LEVOPHED) Adult infusion 12 mcg/min (05/28/19 0939)  . sodium phosphate  Dextrose 5% IVPB 43 mL/hr at 05/28/19 0800    Assessment: 6 yoM admitted with shock and concern for ACS now s/p Impella CP placement in cath lab. Impella CP swapped out for 5.0 d/t hemolysis.   Purge solution running at 8.2 ml/hr, providing 410 units/hr of heparin. Systemic heparin running at 13 ml/hr, providing 1300 units/hr of heparin. ACT this AM is within goal at 169.   No overt bleeding. Hgb is up at 8.7 after receiving 2 units PRBC yesterday. Pltc are down to 94 stable.  Monitoring LDH - peaked at 1500, trended down since switching Impella CP to 5.0. Increased today with increase in Impella speed to  P8, but now decreasing to P7.  Goal of Therapy:  ACT 160-180 Monitor platelets by anticoagulation protocol: Yes   Plan:  Continue heparin in purge solution and systemic heparin Check ACT and titrate heparin rate per RN protocols Monitor daily heparin level, ACT, CBC Monitor for bleeding  Richardine Service, PharmD PGY1 Pharmacy Resident Phone: [phone] 05/28/2019  10:50 AM  Please check [email or web address] for unit-specific pharmacy phone numbers.

## 2019-05-28 NOTE — Progress Notes (Signed)
NAME:  Raymond Chavez, MRN:  [id number], DOB:  Dec 25, 1950, LOS: 12 ADMISSION DATE:  04/25/2019, CONSULTATION DATE:  05/02/2019 REFERRING MD:  Billy Fischer  CHIEF COMPLAINT:  SOB   Brief History   Raymond Chavez is a 68 y.o. male who was admitted with acute hypoxic respiratory failure due to acute pulmonary edema in setting of NSTEMI, cardiogenic shock, possible PNA, He required intubation in the ED after failing BiPAP. On impella for hemodynamic support CVTS and cardiology are discussing about CABG, LVAD  Past Medical History  HTN, CAD.  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  10/27-heparin stopped due to low platelets.  HIT panel sent.  Starting bivalirudin.  Off Levophed.  On minimal vent support but failed weaning trials 10/28 Bronchoscopy  10/29 Started on CRRT 10/30 Bronchoscopy with mucous plug removal 11/1 Extubated  Consults:  Cardiology PCCM Nephrology   Procedures:  ETT 10/22 >11/1 Swan 10/22 > Impella 10/23 >> 10/24 Changed to 5.0 via right axillary >>  Vas cath 10/29   Significant Diagnostic Tests:  CTA chest 10/22 > no PE, b/l basilar consolidation, diffuse interlobular septal thickening with GGO's, b/l hilar adenopathy. Echo 11/2: LVEF 15%, impella in place 10/27 UE venous doppler: Right: No evidence of deep vein thrombosis in the upper extremity. However, unable to visualize the IJV, axillary. No evidence of superficial vein thrombosis in the upper extremity. However, unable to visualize the IJV, axillary. No evidence of thrombosis in the . However, unable to visualize the IJV, axillary.   Left: No evidence of thrombosis in the subclavian.     Micro Data:  Blood 10/22, 10/24, 10/28, 10/29 >>> NGTD Sputum 10/24 >>> Neg Urine 10/22 >>>Neg Urine Strep > Neg RVP 10/22 > Neg MRSA  PCR > Neg SARS CoV2 10/22 > neg. BAL 10/28 > Enterococcus faecalis Blood 10/29: negative  Antimicrobials:  Vanc 10/22 >10/25, 10/28>10/31 Cefepime 10/22 >10/26, 10/29>11/1 Ampicillin 11/1>  Interim history/subjective:  11/3: Remains extubated on 7LNC. Per RN mental status is improved. Remains on levo but is weaning. On p7 impella and increased dose of milrinone as well as amio. Wbc remains elevated around 30, diff pending.   Objective:  Blood pressure (!) 89/65, pulse 82, temperature 98.2 F (36.8 C), temperature source Core, resp. rate 20, height _0  (1.702 m), weight 72 kg, SpO2 98 %. PAP: (46-71)/(16-38) 46/24 CVP:  [1 mmHg-8 mmHg] 2 mmHg CO:  [3.7 L/min-4.6 L/min] 4 L/min CI:  [2 L/min/m2-2.5 L/min/m2] 2.1 L/min/m2      Intake/Output Summary (Last 24 hours) at 05/28/2019 0941 Last data filed at 05/28/2019 0900 Gross per 24 hour  Intake 3053.2 ml  Output 3453 ml  Net -399.8 ml   Filed Weights   05/26/19 0115 05/27/19 0329 05/28/19 0500  Weight: 71.8 kg 73 kg 72 kg   Physical Exam: General: Chronically ill-appearing, no acute distress HENT: NCAT,  MMMP Eyes: EOMI, no scleral icterus Respiratory: diminished bilaterally but clear  Cardiovascular: Continuous hum GI: BS+, soft, nontender Extremities:-Edema,-tenderness Neuro: Awake and alert, follows commands, CNII-XII grossly intact  Assessment & Plan:   Acute hypoxic respiratory failure requiring intubation - multifactorial due to LLL PNA, acute pulmonary edema in setting of cardiogenic shock  Extubated 11/2 and tolerating Stewart. 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  H/O MI 1998, CAD  EF 15-20% with decreased RV systolic function  82/80  RHC/LHC which showed severe 3V CAD (LM 75%, LAD 100%, LCX 80%, RCA 80% prox) and severely depressed LV function Plan Cardiology/CT Surgery Following >> ??LVAD vs CABG  Continue milrinone, amiodarone -Continue Impella at p7 today and heparin gtt -ldh is up to 718 from 589 Continue levophed for MAP goal >65 Continue preadmission ASA Hold preadmission amlodipine, atorvastatin, HCTZ, lisinopril   Co-comitant Septic Shock secondary to Enterococcus Faecalis PNA Wbc increasing Plan Continue pressor support as above, weaning for map >65 Ampicillin for 7 days -check diff and pct -no bandemia on previous diff of late  Acute Kidney Injury  Plan Nephrology following Hd per renal  Thrombocytopenia HIT panel negative Plan Heparin gtt  Hypophosphatemia Plan Replete  Best Practice:  Diet: speech eval pending today Pain/Anxiety/Delirium protocol: -- VAP protocol: -- DVT prophylaxis: SCD's /heparin GI prophylaxis: PPI. Glucose control: SSI. Mobility: pt following Code Status: Full. Family Communication: per cards.  Disposition: ICU. Will ask heartfailure to take as primary in this critically ill pt with impella device in place. CCM will follow as consult for resp issues.   Labs   CBC: Recent Labs  Lab 05/25/19 0341  05/26/19 0250  05/27/19 0309  05/27/19 0749 05/27/19 1414 05/27/19 1651 05/28/19 0341 05/28/19 0403  WBC 19.0*  --  17.9*  --  30.0*  --   --   --  26.8* 29.7*  --   NEUTROABS  --   --   --   --   --   --   --   --  20.5*  --   --   HGB 9.9*   < > 8.2*   < > 7.2*   < > 7.1* 8.1* 7.7* 8.7* 8.5*  HCT 30.6*   < > 25.4*   < > 22.0*   < > 21.0* 24.3* 22.8* 26.3* 25.0*  MCV 91.1  --  91.7  --  89.8  --   --   --  89.1 89.5  --   PLT 109*  --  97*  --  120*  --   --   --  95* 94*  --    < > = values in this interval not displayed.   Basic Metabolic Panel: Recent Labs  Lab 05/24/19 0420  05/25/19 0341  05/26/19 0250  05/26/19 1831  05/27/19 0309 05/27/19  0323 05/27/19 0749 05/27/19 1414 05/28/19 0342 05/28/19 0403  NA 134*   < > 136   < > 136   < > 135   < > 135 133* 135 135 136 138  K 4.3   < > 4.9   < > 4.0   < > 4.1   < > 4.4 4.4 4.7 4.4 4.1 4.2  CL 100   < > 101   < > 104  --  103  --  101  --   --  100 99  --   CO2 22   < > 23   < > 24  --  21*  --  21*  --   --  21* 24  --   GLUCOSE 179*   < > 193*   < > 119*  --  125*  --  124*  --   --  135* 134*  --   BUN 47*   < > 33*   < > 33*  --  40*  --  36*  --   --  32* 30*  --   CREATININE 2.72*   < > 1.75*   < > 1.45*  --  1.36*  --  1.39*  --   --  1.31* 1.36*  --   CALCIUM 7.6*   < > 8.1*   < > 7.7*  --  8.1*  --  7.8*  --   --  7.7* 8.0*  --   MG 2.4  --  2.5*  --  2.4  --   --   --  2.5*  --   --   --  2.6*  --   PHOS 3.1   < > 3.6   < > 1.5*  --  1.9*  --  1.8*  --   --  3.8 2.1*  --    < > = values in this interval not displayed.   GFR: Estimated Creatinine Clearance: 47.9 mL/min (A) (by C-G formula based on SCr of 1.36 mg/dL (H)). Recent Labs  Lab 05/21/19 1045  05/23/19 1217 05/24/19 0420 05/25/19 0341 05/26/19 0250 05/27/19 0309 05/27/19 1651 05/28/19 0341  PROCALCITON 0.98  --  0.72 0.40 0.45  --   --   --   --   WBC  --    < >  --  17.9* 19.0* 17.9* 30.0* 26.8* 29.7*   < > = values in this interval not displayed.   Liver Function Tests: Recent Labs  Lab 05/26/19 0250 05/26/19 1831 05/27/19 0309 05/27/19 1414 05/28/19 0342  ALBUMIN 2.5* 2.4* 2.3* 2.2* 2.2*   No results for input(s): LIPASE, AMYLASE in the last 168 hours. No results for input(s): AMMONIA in the last 168 hours. ABG    Component Value Date/Time   PHART 7.543 (H) 05/28/2019 0403   PCO2ART 31.4 (L) 05/28/2019 0403   PO2ART 70.0 (L) 05/28/2019 0403   HCO3 27.1 05/28/2019 0403   TCO2 28 05/28/2019 0403   ACIDBASEDEF 5.3 (H) 05/23/2019 0335   O2SAT 96.0 05/28/2019 0403    Coagulation Profile: No results for input(s): INR, PROTIME in the last 168 hours. Cardiac Enzymes: No results for  input(s): CKTOTAL, CKMB, CKMBINDEX, TROPONINI in the last 168 hours. HbA1C: Hgb A1c MFr Bld  Date/Time Value Ref Range Status  05/01/2019 11:25 PM 5.5 4.8 - 5.6 % Final    Comment:    (NOTE) Pre diabetes:          5.7%-6.4% Diabetes:              >6.4% Glycemic control for   <7.0% adults with diabetes    CBG: Recent Labs  Lab 05/27/19 1622 05/27/19 2033 05/27/19 2124 05/28/19 0015 05/28/19 0334  GLUCAP 115* 129* 120* 117* 131*    Critical care time: The patient is critically ill with multiple organ systems failure and requires high complexity decision making for assessment and support, frequent evaluation and titration of therapies, application of advanced monitoring technologies and extensive interpretation of multiple databases.  Critical care time 37 mins. This represents my time independent of the NPs time taking care of the pt. This is excluding procedures.    Audria Nine DO After hours pager: [phone]  [facility] 05/28/2019, 9:41 AM

## 2019-05-28 NOTE — Progress Notes (Signed)
Patient ID: Raymond Chavez, male   DOB: 02-18-1951, 68 y.o.   MRN: [id number]    Advanced Heart Failure Rounding Note   Subjective:    Underwent placement of Impella 5.0 on 10/24 with removal of R femoral Impella CP.   Extubated 11/1, now on HFNC 7L.  Breathing more comfortable today, more alert.  Impella increased to P8 yesterday and milrinone to 0.5 with lower output.  Currently on milrinone 0.5 and NE 16.   CVVH started on 10/29.  Currently running UF to keep I/Os even.  Minimal urine production, foley now out.  CVP 6 today.  Bronchoscopy 10/30 for mucus plugging.   Enterococcus faecalis on BAL culture.  Afebrile, WBCs still 30 today, patient is on ampicillin.   Plts 94, HIT negative.  Getting heparin gtt.   Hgb 8.7 today after 2 units PRBCs on 11/2.   Swan #s  PA 54/25 CVP 6 CI 2.14 Co-ox 55%  He says that he feels good this morning, able to stand at side of bed yesterday.   Impella P-8 No flows due to damage of sensor during placement. Motor current stable. LDH 881 => 911 => 593 => 589 => 718.   RHC/LHC (10/23):  Coronary Findings  Diagnostic Dominance: Right Left Main  75% distal left main stenosis.  Left Anterior Descending  Occluded proximally, some late filling by collaterals.  Ramus Intermedius  Large vessel, patent with luminal irregularities.  Left Circumflex  80% mid LCx stenosis. Small OM1 with 95% proximal stenosis. Large OM2 is subtotally occluded (fills late with TIMI 2 flow).  Right Coronary Artery  80% proximal stenosis. Serial 50% mid-vessel stenoses. PDA with only luminal irregularities. Large PLV with 80% mid-vessel stenosis.  Intervention  No interventions have been documented. Right Heart  Right Heart Pressures RHC Procedural Findings (mmHg): Hemodynamics RA mean 13 RV 70/17 PA 73/41, mean 53 PCWP mean 30 LV 140/40 AO 138/88  Oxygen saturations: PA 60% AO 97%  Cardiac Output (Fick) 2.95  Cardiac Index (Fick) 1.62 PVR 7.8 WU   Cardiac Output (Thermo) 3.24 Cardiac Index (Thermo) 1.78  PVR 7.1 WU  PAPI 2.46    Objective:   Weight Range:  Vital Signs:   Temp:  [97 F (36.1 C)-97.7 F (36.5 C)] 97.5 F (36.4 C) (11/03 0700) Pulse Rate:  [78-91] 82 (11/03 0711) Resp:  [14-43] 19 (11/03 0711) BP: (74-111)/(48-78) 104/78 (11/03 0711) SpO2:  [90 %-100 %] 100 % (11/03 0711) Arterial Line BP: (70-122)/(51-82) 80/71 (11/03 0700) Weight:  [72 kg] 72 kg (11/03 0500) Last BM Date: (PTA)  Weight change: Filed Weights   05/26/19 0115 05/27/19 0329 05/28/19 0500  Weight: 71.8 kg 73 kg 72 kg    Intake/Output:   Intake/Output Summary (Last 24 hours) at 05/28/2019 0735 Last data filed at 05/28/2019 0700 Gross per 24 hour  Intake 3251.23 ml  Output 3686 ml  Net -434.77 ml     Physical Exam: CVP 6 General: NAD Neck: No JVD, no thyromegaly or thyroid nodule.  Lungs: Decreased at bases.  CV: Nondisplaced PMI.  Heart regular S1/S2, no S3/S4, no murmur with Impella sounds.  Trace ankle edema.   Abdomen: Soft, nontender, no hepatosplenomegaly, no distention.  Skin: Intact without lesions or rashes.  Neurologic: Alert and oriented x 3.  Psych: Normal affect. Extremities: No clubbing or cyanosis.  HEENT: Normal.   Telemetry: NSR 90s with PVCs (personally reviewed)  Labs: Basic Metabolic Panel: Recent Labs  Lab 05/24/19 0420  05/25/19 0341  05/26/19 0250  05/26/19 1831  05/27/19 0309 05/27/19 0323 05/27/19 0749 05/27/19 1414 05/28/19 0342 05/28/19 0403  NA 134*   < > 136   < > 136   < > 135   < > 135 133* 135 135 136 138  K 4.3   < > 4.9   < > 4.0   < > 4.1   < > 4.4 4.4 4.7 4.4 4.1 4.2  CL 100   < > 101   < > 104  --  103  --  101  --   --  100 99  --   CO2 22   < > 23   < > 24  --  21*  --  21*  --   --  21* 24  --   GLUCOSE 179*   < > 193*   < > 119*  --  125*  --  124*  --   --  135* 134*  --   BUN 47*   < > 33*   < > 33*  --  40*  --  36*  --   --  32* 30*  --   CREATININE 2.72*   < > 1.75*    < > 1.45*  --  1.36*  --  1.39*  --   --  1.31* 1.36*  --   CALCIUM 7.6*   < > 8.1*   < > 7.7*  --  8.1*  --  7.8*  --   --  7.7* 8.0*  --   MG 2.4  --  2.5*  --  2.4  --   --   --  2.5*  --   --   --  2.6*  --   PHOS 3.1   < > 3.6   < > 1.5*  --  1.9*  --  1.8*  --   --  3.8 2.1*  --    < > = values in this interval not displayed.    Liver Function Tests: Recent Labs  Lab 05/26/19 0250 05/26/19 1831 05/27/19 0309 05/27/19 1414 05/28/19 0342  ALBUMIN 2.5* 2.4* 2.3* 2.2* 2.2*   No results for input(s): LIPASE, AMYLASE in the last 168 hours. No results for input(s): AMMONIA in the last 168 hours.  CBC: Recent Labs  Lab 05/25/19 0341  05/26/19 0250  05/27/19 0309  05/27/19 0749 05/27/19 1414 05/27/19 1651 05/28/19 0341 05/28/19 0403  WBC 19.0*  --  17.9*  --  30.0*  --   --   --  26.8* 29.7*  --   NEUTROABS  --   --   --   --   --   --   --   --  20.5*  --   --   HGB 9.9*   < > 8.2*   < > 7.2*   < > 7.1* 8.1* 7.7* 8.7* 8.5*  HCT 30.6*   < > 25.4*   < > 22.0*   < > 21.0* 24.3* 22.8* 26.3* 25.0*  MCV 91.1  --  91.7  --  89.8  --   --   --  89.1 89.5  --   PLT 109*  --  97*  --  120*  --   --   --  95* 94*  --    < > = values in this interval not displayed.    Cardiac Enzymes: No results for input(s): CKTOTAL, CKMB, CKMBINDEX, TROPONINI in the last 168 hours.  BNP: BNP (  last 3 results) Recent Labs    05/23/2019 1739 05/03/2019 1234  BNP 2,725.3* 3,370.0*    ProBNP (last 3 results) No results for input(s): PROBNP in the last 8760 hours.    Other results:  Imaging: Dg Chest Port 1 View  Result Date: 05/27/2019 CLINICAL DATA:  Impella catheter placement EXAM: PORTABLE CHEST 1 VIEW COMPARISON:  05/25/2019 FINDINGS: The right upper extremity Impella projects over the expected region of the left ventricle. The Swan-Ganz catheter tip projects over the main pulmonary artery. The left-sided central venous catheter tip projects over the SVC/left brachiocephalic vein.  Diffuse hazy bilateral airspace opacities with prominent interstitial lung markings are again noted. The heart size remains enlarged. There is no pneumothorax. IMPRESSION: 1. Lines and tubes as above.  No pneumothorax. 2. Worsening pulmonary edema. Electronically Signed   By: Constance Holster M.D.   On: 05/27/2019 05:49     Medications:     Scheduled Medications: . sodium chloride   Intravenous Once  . sodium chloride   Intravenous Once  . arformoterol  15 mcg Nebulization BID  . aspirin  81 mg Per Tube Daily  . atorvastatin  80 mg Per Tube q1800  . B-complex with vitamin C  1 tablet Per Tube Daily  . chlorhexidine  15 mL Mouth Rinse BID  . Chlorhexidine Gluconate Cloth  6 each Topical Daily  . insulin aspart  1-3 Units Subcutaneous Q4H  . ipratropium  0.5 mg Nebulization BID  . lidocaine-EPINEPHrine  20 mL Infiltration Once  . mouth rinse  15 mL Mouth Rinse q12n4p  . pantoprazole (PROTONIX) IV  40 mg Intravenous QHS  . sodium chloride flush  10-40 mL Intracatheter Q12H  . sodium chloride flush  3 mL Intravenous Q12H    Infusions: .  prismasol BGK 4/2.5 500 mL/hr at 05/27/19 1012  .  prismasol BGK 4/2.5 500 mL/hr at 05/28/19 0502  . sodium chloride 10 mL/hr at 05/20/19 0910  . sodium chloride 10 mL/hr at 05/28/19 0700  . sodium chloride Stopped (05/28/19 0127)  . sodium chloride    . amiodarone 30 mg/hr (05/28/19 0700)  . ampicillin (OMNIPEN) IV Stopped (05/28/19 0525)  . impella catheter heparin 50 unit/mL in dextrose 5% 50,000 Units (05/23/19 0815)  . heparin 1,300 Units/hr (05/28/19 0700)  . milrinone 0.5 mcg/kg/min (05/28/19 0700)  . norepinephrine (LEVOPHED) Adult infusion 16 mcg/min (05/28/19 0700)  . prismasol BGK 4/2.5 2,000 mL/hr at 05/28/19 0502  . sodium phosphate  Dextrose 5% IVPB 43 mL/hr at 05/28/19 0700    PRN Medications: sodium chloride, Place/Maintain arterial line **AND** sodium chloride, sodium chloride, docusate, heparin, influenza vaccine adjuvanted,  ipratropium-albuterol, pneumococcal 23 valent vaccine, sodium chloride, sodium chloride flush, sodium chloride flush   Assessment/Plan:   1. Shock: Possible mixed cardiogenic/septic with fever and suspected PNA.  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  Echo with EF 20%, RV moderately HK.  Impella CP placed 10/23. Switched for Impella 5.0 on 10/24. No flow meter on Impella due to damage to sensor on insertion. Patient has LBBB.  On milrinone 0.5 and norepinephrine now significantly higher at 16.  CI ok at 2.14, co-ox better at 55%.  CVP 5-6. CVVH ongoing, Had been aiming to keep I/Os even.   - Continue current milrinone 0.5 and can continue to wean down NE as able.  - Discussed with Dr. Carolin Sicks, with CVP 5-6 will hold CVVH today and see how he does.   - Continue  Impella, motor current stable this morning (sensor nonfunctional). LDH higher at 718 with increase in speed yesterday to P8. Position was good under echo yesterday.  He is getting systemic heparin. Will decrease back to P7 with higher LDH.  2. CAD:  NSTEMI, hs-TnI 16,000. 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  - continue ASA/statin. No b-blocker with shock - CABG with Impella 5.0 support likely best option when able, discussed again with Dr. [name] this morning. 3. AKI: Baseline creatinine 1.3, now AKI likely due to ATN from shock and contrast (had CTA for PE at admission, only 25 cc contrast with cath). Now on CVVH, little UOP.  - Hold CVVH today as above and see how kidneys do.   4. Acute Respiratory Failure: Pulmonary edema + PNA left base. Enterococcus faecalis in BAL cultures. He remains on high flow oxygen by [place] 7L.  Pulmonary edema improved.  -  Holding CVVH as above with low CVP.  - CXR today.    5. ID: Suspected PNA, CXR with left base density. Enterococcus faecalis in BAL cultures.  Now afebrile, but WBCs up to 30K.  - He is on ampicillin IV now per CCM.    6.  LBBB: Unsure chronicity.  7. Anemia: Hgb 8.7 after 2 units PRBCs total on 11/2.      8. Thrombocytopenia: Platelets 97K today, HIT negative.  Got 1 unit plts 10/29 pre-line placement.  ?Low due to sepsis versus hemolysis.    9. PVCs/NSVT: Amiodarone gtt. Quiescent.  10. FEN: Needs swallow study today.   CRITICAL CARE Performed by: Loralie Champagne  Total critical care time: 40 minutes  Critical care time was exclusive of separately billable procedures and treating other patients.  Critical care was necessary to treat or prevent imminent or life-threatening deterioration.  Critical care was time spent personally by me on the following activities: development of treatment plan with patient and/or surrogate as well as nursing, discussions with consultants, evaluation of patient's response to treatment, examination of patient, obtaining history from patient or surrogate, ordering and performing treatments and interventions, ordering and review of laboratory studies, ordering and review of radiographic studies, pulse oximetry and re-evaluation of patient's condition.  Loralie Champagne 05/28/2019 7:35 AM

## 2019-05-28 NOTE — Progress Notes (Signed)
Verbal order received from Dr Aundra Dubin to mobilize to chair with Gordy Councilman.

## 2019-05-28 NOTE — Progress Notes (Signed)
[facility] PROGRESS NOTE  Assessment/ Plan: Pt is a 68 y.o. yo male with cardiogenic shock who underwent placement of Impella on 10/24, on milrinone and Levophed, cardiac cath with three-vessel disease and echocardiogram revealing EF of 20%, respiratory failure, consulted for AKI.  Baseline creatinine level around 1.3.  #Acute kidney injury on CKD: Likely due to cardiorenal syndrome and contrast nephropathy. CRRT since 10/30, tolerating well.  CVP is around 5-6, volume status is acceptable and potassium level 4.2 today.  Discussed with the cardiology.  We will hold off on CRRT today and watch for renal recovery.  Continue to keep the dialysis catheter.  Strict ins and out.    #Cardiogenic shock: EF 20%, has Impella.  Heart failure team is following.  On milrinone and Levophed for shock.  #Acute respiratory failure with hypoxia: Due to CHF and pneumonia.  Pulmonary team is following.  On ampicillin.  #CAD: NSTEMI, CABG with severe three-vessel disease.  On aspirin statin.  No beta-blocker because of shock.  #Hypophosphatemia: Repleted phosphorus.  Monitor lab.  Off CRRT.  Subjective: Seen and examined at bedside.  Feeling much better.  No shortness of breath.  Volume status is acceptable with improvement in CVP.  Remains anuric.  Objective Vital signs in last 24 hours: Vitals:   05/28/19 0500 05/28/19 0600 05/28/19 0700 05/28/19 0711  BP: 102/71 94/72 104/78 104/78  Pulse:    82  Resp: (!) 29 (!) 29 (!) 31 19  Temp: 97.7 F (36.5 C) (!) 97.5 F (36.4 C) (!) 97.5 F (36.4 C)   TempSrc:      SpO2: 99% 97% 99% 100%  Weight: 72 kg     Height:       Weight change: -1 kg  Intake/Output Summary (Last 24 hours) at 05/28/2019 9166 Last data filed at 05/28/2019 0800 Gross per 24 hour  Intake 3016.29 ml  Output 3611 ml  Net -594.71 ml       Labs: Basic Metabolic Panel: Recent Labs  Lab 05/27/19 0309  05/27/19 1414 05/28/19 0342 05/28/19 0403  NA 135    < > 135 136 138  K 4.4   < > 4.4 4.1 4.2  CL 101  --  100 99  --   CO2 21*  --  21* 24  --   GLUCOSE 124*  --  135* 134*  --   BUN 36*  --  32* 30*  --   CREATININE 1.39*  --  1.31* 1.36*  --   CALCIUM 7.8*  --  7.7* 8.0*  --   PHOS 1.8*  --  3.8 2.1*  --    < > = values in this interval not displayed.   Liver Function Tests: Recent Labs  Lab 05/27/19 0309 05/27/19 1414 05/28/19 0342  ALBUMIN 2.3* 2.2* 2.2*   No results for input(s): LIPASE, AMYLASE in the last 168 hours. No results for input(s): AMMONIA in the last 168 hours. CBC: Recent Labs  Lab 05/25/19 0341  05/26/19 0250  05/27/19 0309  05/27/19 1651 05/28/19 0341 05/28/19 0403  WBC 19.0*  --  17.9*  --  30.0*  --  26.8* 29.7*  --   NEUTROABS  --   --   --   --   --   --  20.5*  --   --   HGB 9.9*   < > 8.2*   < > 7.2*   < > 7.7* 8.7* 8.5*  HCT 30.6*   < > 25.4*   < >  22.0*   < > 22.8* 26.3* 25.0*  MCV 91.1  --  91.7  --  89.8  --  89.1 89.5  --   PLT 109*  --  97*  --  120*  --  95* 94*  --    < > = values in this interval not displayed.   Cardiac Enzymes: No results for input(s): CKTOTAL, CKMB, CKMBINDEX, TROPONINI in the last 168 hours. CBG: Recent Labs  Lab 05/27/19 1622 05/27/19 2033 05/27/19 2124 05/28/19 0015 05/28/19 0334  GLUCAP 115* 129* 120* 117* 131*    Iron Studies: No results for input(s): IRON, TIBC, TRANSFERRIN, FERRITIN in the last 72 hours. Studies/Results: Dg Chest Port 1 View  Result Date: 05/28/2019 CLINICAL DATA:  Shortness of breath.  CHF.  Impella device. EXAM: PORTABLE CHEST 1 VIEW COMPARISON:  05/27/2019. FINDINGS: Impella device, Swan-Ganz catheter, left IJ line stable position, right PICC line. Stable cardiomegaly. Diffuse bilateral pulmonary infiltrates/edema again noted. Similar findings noted on prior exam. No pleural effusion or pneumothorax. IMPRESSION: 1.  Lines in stable position.  Impella device in stable position. 2. Stable cardiomegaly. Diffuse bilateral pulmonary  infiltrates/edema unchanged. Electronically Signed   By: Marcello Moores  Register   On: 05/28/2019 08:15   Dg Chest Port 1 View  Result Date: 05/27/2019 CLINICAL DATA:  Impella catheter placement EXAM: PORTABLE CHEST 1 VIEW COMPARISON:  05/25/2019 FINDINGS: The right upper extremity Impella projects over the expected region of the left ventricle. The Swan-Ganz catheter tip projects over the main pulmonary artery. The left-sided central venous catheter tip projects over the SVC/left brachiocephalic vein. Diffuse hazy bilateral airspace opacities with prominent interstitial lung markings are again noted. The heart size remains enlarged. There is no pneumothorax. IMPRESSION: 1. Lines and tubes as above.  No pneumothorax. 2. Worsening pulmonary edema. Electronically Signed   By: Constance Holster M.D.   On: 05/27/2019 05:49    Medications: Infusions: . sodium chloride 10 mL/hr at 05/20/19 0910  . sodium chloride 10 mL/hr at 05/28/19 0700  . sodium chloride Stopped (05/28/19 0127)  . sodium chloride    . amiodarone 30 mg/hr (05/28/19 0700)  . ampicillin (OMNIPEN) IV Stopped (05/28/19 0525)  . impella catheter heparin 50 unit/mL in dextrose 5% 50,000 Units (05/23/19 0815)  . heparin 1,300 Units/hr (05/28/19 0700)  . milrinone 0.5 mcg/kg/min (05/28/19 0700)  . norepinephrine (LEVOPHED) Adult infusion 16 mcg/min (05/28/19 0700)  . sodium phosphate  Dextrose 5% IVPB 43 mL/hr at 05/28/19 0700    Scheduled Medications: . sodium chloride   Intravenous Once  . sodium chloride   Intravenous Once  . arformoterol  15 mcg Nebulization BID  . aspirin  81 mg Per Tube Daily  . atorvastatin  80 mg Per Tube q1800  . B-complex with vitamin C  1 tablet Per Tube Daily  . chlorhexidine  15 mL Mouth Rinse BID  . Chlorhexidine Gluconate Cloth  6 each Topical Daily  . insulin aspart  1-3 Units Subcutaneous Q4H  . ipratropium  0.5 mg Nebulization BID  . lidocaine-EPINEPHrine  20 mL Infiltration Once  . mouth rinse  15  mL Mouth Rinse q12n4p  . pantoprazole (PROTONIX) IV  40 mg Intravenous QHS  . sodium chloride flush  10-40 mL Intracatheter Q12H  . sodium chloride flush  3 mL Intravenous Q12H    have reviewed scheduled and prn medications.  Physical Exam: General: Able to lie flat, not in distress Heart:RRR, s1s2 nl Lungs: Basal crackles, no increased work of breathing Abdomen:soft, Non-tender, non-distended  Extremities:No edema Dialysis Access: Left IJ catheter site clean.   Raymond Chavez 05/28/2019,8:22 AM  LOS: 12 days  Pager: [phone]

## 2019-05-28 NOTE — Progress Notes (Signed)
  Pt did not tolerate PO diet. Had large amount of vomiting after PO intake. KUB w/ findings c/w adynamic ileus. Discussed w/ Dr. Aundra Dubin. Will order IV Reglan.    Lyda Jester, PA-C

## 2019-05-29 ENCOUNTER — Inpatient Hospital Stay (HOSPITAL_COMMUNITY): Payer: PPO

## 2019-05-29 ENCOUNTER — Inpatient Hospital Stay (HOSPITAL_COMMUNITY): Payer: PPO | Admitting: Certified Registered Nurse Anesthetist

## 2019-05-29 ENCOUNTER — Inpatient Hospital Stay (HOSPITAL_COMMUNITY): Admission: EM | Disposition: E | Payer: Self-pay | Source: Home / Self Care | Attending: Cardiothoracic Surgery

## 2019-05-29 ENCOUNTER — Encounter (HOSPITAL_COMMUNITY): Payer: Self-pay | Admitting: Certified Registered Nurse Anesthetist

## 2019-05-29 DIAGNOSIS — J9602 Acute respiratory failure with hypercapnia: Secondary | ICD-10-CM

## 2019-05-29 DIAGNOSIS — I509 Heart failure, unspecified: Secondary | ICD-10-CM

## 2019-05-29 DIAGNOSIS — R042 Hemoptysis: Secondary | ICD-10-CM

## 2019-05-29 DIAGNOSIS — R570 Cardiogenic shock: Secondary | ICD-10-CM

## 2019-05-29 DIAGNOSIS — J9601 Acute respiratory failure with hypoxia: Secondary | ICD-10-CM

## 2019-05-29 DIAGNOSIS — Z95811 Presence of heart assist device: Secondary | ICD-10-CM

## 2019-05-29 DIAGNOSIS — Z978 Presence of other specified devices: Secondary | ICD-10-CM

## 2019-05-29 HISTORY — PX: TEE WITHOUT CARDIOVERSION: SHX5443

## 2019-05-29 HISTORY — PX: CANNULATION FOR ECMO (EXTRACORPOREAL MEMBRANE OXYGENATION): SHX6796

## 2019-05-29 LAB — POCT I-STAT 7, (LYTES, BLD GAS, ICA,H+H)
Acid-Base Excess: 5 mmol/L — ABNORMAL HIGH (ref 0.0–2.0)
Acid-Base Excess: 4 mmol/L — ABNORMAL HIGH (ref 0.0–2.0)
Acid-Base Excess: 1 mmol/L (ref 0.0–2.0)
Acid-Base Excess: 1 mmol/L (ref 0.0–2.0)
Acid-base deficit: 7 mmol/L — ABNORMAL HIGH (ref 0.0–2.0)
Acid-base deficit: 1 mmol/L (ref 0.0–2.0)
Acid-base deficit: 8 mmol/L — ABNORMAL HIGH (ref 0.0–2.0)
Bicarbonate: 14.7 mmol/L — ABNORMAL LOW (ref 20.0–28.0)
Bicarbonate: 19.7 mmol/L — ABNORMAL LOW (ref 20.0–28.0)
Bicarbonate: 25.9 mmol/L (ref 20.0–28.0)
Bicarbonate: 25.5 mmol/L (ref 20.0–28.0)
Bicarbonate: 28 mmol/L (ref 20.0–28.0)
Bicarbonate: 29 mmol/L — ABNORMAL HIGH (ref 20.0–28.0)
Bicarbonate: 23.3 mmol/L (ref 20.0–28.0)
Bicarbonate: 25.6 mmol/L (ref 20.0–28.0)
Calcium, Ion: 1 mmol/L — ABNORMAL LOW (ref 1.15–1.40)
Calcium, Ion: 0.93 mmol/L — ABNORMAL LOW (ref 1.15–1.40)
Calcium, Ion: 1.02 mmol/L — ABNORMAL LOW (ref 1.15–1.40)
Calcium, Ion: 0.87 mmol/L — CL (ref 1.15–1.40)
Calcium, Ion: 0.88 mmol/L — CL (ref 1.15–1.40)
Calcium, Ion: 0.95 mmol/L — ABNORMAL LOW (ref 1.15–1.40)
Calcium, Ion: 0.92 mmol/L — ABNORMAL LOW (ref 1.15–1.40)
Calcium, Ion: 0.89 mmol/L — CL (ref 1.15–1.40)
HCT: 21 % — ABNORMAL LOW (ref 39.0–52.0)
HCT: 16 % — ABNORMAL LOW (ref 39.0–52.0)
HCT: 25 % — ABNORMAL LOW (ref 39.0–52.0)
HCT: 22 % — ABNORMAL LOW (ref 39.0–52.0)
HCT: 23 % — ABNORMAL LOW (ref 39.0–52.0)
HCT: 26 % — ABNORMAL LOW (ref 39.0–52.0)
HCT: 22 % — ABNORMAL LOW (ref 39.0–52.0)
HCT: 26 % — ABNORMAL LOW (ref 39.0–52.0)
Hemoglobin: 7.1 g/dL — ABNORMAL LOW (ref 13.0–17.0)
Hemoglobin: 5.4 g/dL — CL (ref 13.0–17.0)
Hemoglobin: 8.5 g/dL — ABNORMAL LOW (ref 13.0–17.0)
Hemoglobin: 7.5 g/dL — ABNORMAL LOW (ref 13.0–17.0)
Hemoglobin: 7.8 g/dL — ABNORMAL LOW (ref 13.0–17.0)
Hemoglobin: 8.8 g/dL — ABNORMAL LOW (ref 13.0–17.0)
Hemoglobin: 7.5 g/dL — ABNORMAL LOW (ref 13.0–17.0)
Hemoglobin: 8.8 g/dL — ABNORMAL LOW (ref 13.0–17.0)
O2 Saturation: 93 %
O2 Saturation: 75 %
O2 Saturation: 94 %
O2 Saturation: 100 %
O2 Saturation: 100 %
O2 Saturation: 99 %
O2 Saturation: 100 %
O2 Saturation: 100 %
Patient temperature: 37.1
Patient temperature: 36.4
Patient temperature: 36.8
Patient temperature: 37.2
Potassium: 5.2 mmol/L — ABNORMAL HIGH (ref 3.5–5.1)
Potassium: 4.6 mmol/L (ref 3.5–5.1)
Potassium: 5.5 mmol/L — ABNORMAL HIGH (ref 3.5–5.1)
Potassium: 5.2 mmol/L — ABNORMAL HIGH (ref 3.5–5.1)
Potassium: 5.4 mmol/L — ABNORMAL HIGH (ref 3.5–5.1)
Potassium: 5.6 mmol/L — ABNORMAL HIGH (ref 3.5–5.1)
Potassium: 5.5 mmol/L — ABNORMAL HIGH (ref 3.5–5.1)
Potassium: 5.2 mmol/L — ABNORMAL HIGH (ref 3.5–5.1)
Sodium: 131 mmol/L — ABNORMAL LOW (ref 135–145)
Sodium: 135 mmol/L (ref 135–145)
Sodium: 138 mmol/L (ref 135–145)
Sodium: 137 mmol/L (ref 135–145)
Sodium: 138 mmol/L (ref 135–145)
Sodium: 138 mmol/L (ref 135–145)
Sodium: 136 mmol/L (ref 135–145)
Sodium: 137 mmol/L (ref 135–145)
TCO2: 15 mmol/L — ABNORMAL LOW (ref 22–32)
TCO2: 21 mmol/L — ABNORMAL LOW (ref 22–32)
TCO2: 28 mmol/L (ref 22–32)
TCO2: 27 mmol/L (ref 22–32)
TCO2: 29 mmol/L (ref 22–32)
TCO2: 30 mmol/L (ref 22–32)
TCO2: 24 mmol/L (ref 22–32)
TCO2: 27 mmol/L (ref 22–32)
pCO2 arterial: 18.9 mmHg — CL (ref 32.0–48.0)
pCO2 arterial: 53 mmHg — ABNORMAL HIGH (ref 32.0–48.0)
pCO2 arterial: 56.3 mmHg — ABNORMAL HIGH (ref 32.0–48.0)
pCO2 arterial: 38.2 mmHg (ref 32.0–48.0)
pCO2 arterial: 38.9 mmHg (ref 32.0–48.0)
pCO2 arterial: 43 mmHg (ref 32.0–48.0)
pCO2 arterial: 26 mmHg — ABNORMAL LOW (ref 32.0–48.0)
pCO2 arterial: 42.2 mmHg (ref 32.0–48.0)
pH, Arterial: 7.5 — ABNORMAL HIGH (ref 7.350–7.450)
pH, Arterial: 7.153 — CL (ref 7.350–7.450)
pH, Arterial: 7.295 — ABNORMAL LOW (ref 7.350–7.450)
pH, Arterial: 7.382 (ref 7.350–7.450)
pH, Arterial: 7.474 — ABNORMAL HIGH (ref 7.350–7.450)
pH, Arterial: 7.481 — ABNORMAL HIGH (ref 7.350–7.450)
pH, Arterial: 7.56 — ABNORMAL HIGH (ref 7.350–7.450)
pH, Arterial: 7.392 (ref 7.350–7.450)
pO2, Arterial: 59 mmHg — ABNORMAL LOW (ref 83.0–108.0)
pO2, Arterial: 44 mmHg — ABNORMAL LOW (ref 83.0–108.0)
pO2, Arterial: 90 mmHg (ref 83.0–108.0)
pO2, Arterial: 125 mmHg — ABNORMAL HIGH (ref 83.0–108.0)
pO2, Arterial: 477 mmHg — ABNORMAL HIGH (ref 83.0–108.0)
pO2, Arterial: 561 mmHg — ABNORMAL HIGH (ref 83.0–108.0)
pO2, Arterial: 321 mmHg — ABNORMAL HIGH (ref 83.0–108.0)
pO2, Arterial: 412 mmHg — ABNORMAL HIGH (ref 83.0–108.0)

## 2019-05-29 LAB — COMPREHENSIVE METABOLIC PANEL
ALT: 60 U/L — ABNORMAL HIGH (ref 0–44)
ALT: 40 U/L (ref 0–44)
AST: 100 U/L — ABNORMAL HIGH (ref 15–41)
AST: 41 U/L (ref 15–41)
Albumin: 1.4 g/dL — ABNORMAL LOW (ref 3.5–5.0)
Albumin: 2 g/dL — ABNORMAL LOW (ref 3.5–5.0)
Alkaline Phosphatase: 50 U/L (ref 38–126)
Alkaline Phosphatase: 39 U/L (ref 38–126)
Anion gap: 20 — ABNORMAL HIGH (ref 5–15)
Anion gap: 10 (ref 5–15)
BUN: 74 mg/dL — ABNORMAL HIGH (ref 8–23)
BUN: 54 mg/dL — ABNORMAL HIGH (ref 8–23)
CO2: 23 mmol/L (ref 22–32)
CO2: 23 mmol/L (ref 22–32)
Calcium: 6.7 mg/dL — ABNORMAL LOW (ref 8.9–10.3)
Calcium: 6.2 mg/dL — CL (ref 8.9–10.3)
Chloride: 96 mmol/L — ABNORMAL LOW (ref 98–111)
Chloride: 106 mmol/L (ref 98–111)
Creatinine, Ser: 3.13 mg/dL — ABNORMAL HIGH (ref 0.61–1.24)
Creatinine, Ser: 2.17 mg/dL — ABNORMAL HIGH (ref 0.61–1.24)
GFR calc Af Amer: 22 mL/min — ABNORMAL LOW (ref >60)
GFR calc Af Amer: 35 mL/min — ABNORMAL LOW (ref >60)
GFR calc non Af Amer: 19 mL/min — ABNORMAL LOW (ref >60)
GFR calc non Af Amer: 30 mL/min — ABNORMAL LOW (ref >60)
Glucose, Bld: 138 mg/dL — ABNORMAL HIGH (ref 70–99)
Glucose, Bld: 151 mg/dL — ABNORMAL HIGH (ref 70–99)
Potassium: 4.8 mmol/L (ref 3.5–5.1)
Potassium: 5.5 mmol/L — ABNORMAL HIGH (ref 3.5–5.1)
Sodium: 139 mmol/L (ref 135–145)
Sodium: 139 mmol/L (ref 135–145)
Total Bilirubin: 1.5 mg/dL — ABNORMAL HIGH (ref 0.3–1.2)
Total Bilirubin: 1.9 mg/dL — ABNORMAL HIGH (ref 0.3–1.2)
Total Protein: 3.4 g/dL — ABNORMAL LOW (ref 6.5–8.1)
Total Protein: 3.7 g/dL — ABNORMAL LOW (ref 6.5–8.1)

## 2019-05-29 LAB — CBC WITH DIFFERENTIAL/PLATELET
Abs Immature Granulocytes: 0.27 10*3/uL — ABNORMAL HIGH (ref 0.00–0.07)
Basophils Absolute: 0 10*3/uL (ref 0.0–0.1)
Basophils Relative: 0 %
Eosinophils Absolute: 0 10*3/uL (ref 0.0–0.5)
Eosinophils Relative: 0 %
HCT: 31.7 % — ABNORMAL LOW (ref 39.0–52.0)
Hemoglobin: 11 g/dL — ABNORMAL LOW (ref 13.0–17.0)
Immature Granulocytes: 1 %
Lymphocytes Relative: 12 %
Lymphs Abs: 2.3 10*3/uL (ref 0.7–4.0)
MCH: 30.7 pg (ref 26.0–34.0)
MCHC: 34.7 g/dL (ref 30.0–36.0)
MCV: 88.5 fL (ref 80.0–100.0)
Monocytes Absolute: 1.1 10*3/uL — ABNORMAL HIGH (ref 0.1–1.0)
Monocytes Relative: 6 %
Neutro Abs: 15.6 10*3/uL — ABNORMAL HIGH (ref 1.7–7.7)
Neutrophils Relative %: 81 %
Platelets: 46 10*3/uL — ABNORMAL LOW (ref 150–400)
RBC: 3.58 MIL/uL — ABNORMAL LOW (ref 4.22–5.81)
RDW: 14.3 % (ref 11.5–15.5)
WBC: 19.3 10*3/uL — ABNORMAL HIGH (ref 4.0–10.5)
nRBC: 1.4 % — ABNORMAL HIGH (ref 0.0–0.2)

## 2019-05-29 LAB — CBC
HCT: 18 % — ABNORMAL LOW (ref 39.0–52.0)
HCT: 29 % — ABNORMAL LOW (ref 39.0–52.0)
HCT: 24 % — ABNORMAL LOW (ref 39.0–52.0)
Hemoglobin: 5.9 g/dL — CL (ref 13.0–17.0)
Hemoglobin: 9.9 g/dL — ABNORMAL LOW (ref 13.0–17.0)
Hemoglobin: 8.2 g/dL — ABNORMAL LOW (ref 13.0–17.0)
MCH: 29.9 pg (ref 26.0–34.0)
MCH: 30.3 pg (ref 26.0–34.0)
MCH: 30.4 pg (ref 26.0–34.0)
MCHC: 32.8 g/dL (ref 30.0–36.0)
MCHC: 34.1 g/dL (ref 30.0–36.0)
MCHC: 34.2 g/dL (ref 30.0–36.0)
MCV: 91.4 fL (ref 80.0–100.0)
MCV: 88.7 fL (ref 80.0–100.0)
MCV: 88.9 fL (ref 80.0–100.0)
Platelets: 54 10*3/uL — ABNORMAL LOW (ref 150–400)
Platelets: 47 10*3/uL — ABNORMAL LOW (ref 150–400)
Platelets: 52 10*3/uL — ABNORMAL LOW (ref 150–400)
RBC: 1.97 MIL/uL — ABNORMAL LOW (ref 4.22–5.81)
RBC: 3.27 MIL/uL — ABNORMAL LOW (ref 4.22–5.81)
RBC: 2.7 MIL/uL — ABNORMAL LOW (ref 4.22–5.81)
RDW: 16.6 % — ABNORMAL HIGH (ref 11.5–15.5)
RDW: 15.8 % — ABNORMAL HIGH (ref 11.5–15.5)
RDW: 15.9 % — ABNORMAL HIGH (ref 11.5–15.5)
WBC: 30.1 10*3/uL — ABNORMAL HIGH (ref 4.0–10.5)
WBC: 29.7 10*3/uL — ABNORMAL HIGH (ref 4.0–10.5)
WBC: 29.7 10*3/uL — ABNORMAL HIGH (ref 4.0–10.5)
nRBC: 3 % — ABNORMAL HIGH (ref 0.0–0.2)
nRBC: 2.2 % — ABNORMAL HIGH (ref 0.0–0.2)
nRBC: 1.4 % — ABNORMAL HIGH (ref 0.0–0.2)

## 2019-05-29 LAB — COOXEMETRY PANEL
Carboxyhemoglobin: 1.9 % — ABNORMAL HIGH (ref 0.5–1.5)
Methemoglobin: 1.2 % (ref 0.0–1.5)
O2 Saturation: 79.1 %
Total hemoglobin: 13.4 g/dL (ref 12.0–16.0)

## 2019-05-29 LAB — POCT I-STAT, CHEM 8
BUN: 56 mg/dL — ABNORMAL HIGH (ref 8–23)
Calcium, Ion: 0.96 mmol/L — ABNORMAL LOW (ref 1.15–1.40)
Chloride: 102 mmol/L (ref 98–111)
Creatinine, Ser: 2.4 mg/dL — ABNORMAL HIGH (ref 0.61–1.24)
Glucose, Bld: 139 mg/dL — ABNORMAL HIGH (ref 70–99)
HCT: 27 % — ABNORMAL LOW (ref 39.0–52.0)
Hemoglobin: 9.2 g/dL — ABNORMAL LOW (ref 13.0–17.0)
Potassium: 5.2 mmol/L — ABNORMAL HIGH (ref 3.5–5.1)
Sodium: 138 mmol/L (ref 135–145)
TCO2: 24 mmol/L (ref 22–32)

## 2019-05-29 LAB — DIFFERENTIAL
Abs Immature Granulocytes: 1.86 10*3/uL — ABNORMAL HIGH (ref 0.00–0.07)
Basophils Absolute: 0.1 10*3/uL (ref 0.0–0.1)
Basophils Relative: 0 %
Eosinophils Absolute: 0.1 10*3/uL (ref 0.0–0.5)
Eosinophils Relative: 0 %
Immature Granulocytes: 6 %
Lymphocytes Relative: 12 %
Lymphs Abs: 3.7 10*3/uL (ref 0.7–4.0)
Monocytes Absolute: 2.1 10*3/uL — ABNORMAL HIGH (ref 0.1–1.0)
Monocytes Relative: 7 %
Neutro Abs: 22.3 10*3/uL — ABNORMAL HIGH (ref 1.7–7.7)
Neutrophils Relative %: 75 %

## 2019-05-29 LAB — RENAL FUNCTION PANEL
Albumin: 1.9 g/dL — ABNORMAL LOW (ref 3.5–5.0)
Albumin: 1.5 g/dL — ABNORMAL LOW (ref 3.5–5.0)
Anion gap: 18 — ABNORMAL HIGH (ref 5–15)
Anion gap: 13 (ref 5–15)
BUN: 79 mg/dL — ABNORMAL HIGH (ref 8–23)
BUN: 67 mg/dL — ABNORMAL HIGH (ref 8–23)
CO2: 23 mmol/L (ref 22–32)
CO2: 22 mmol/L (ref 22–32)
Calcium: 6.9 mg/dL — ABNORMAL LOW (ref 8.9–10.3)
Calcium: 6.2 mg/dL — CL (ref 8.9–10.3)
Chloride: 100 mmol/L (ref 98–111)
Chloride: 105 mmol/L (ref 98–111)
Creatinine, Ser: 3.01 mg/dL — ABNORMAL HIGH (ref 0.61–1.24)
Creatinine, Ser: 2.68 mg/dL — ABNORMAL HIGH (ref 0.61–1.24)
GFR calc Af Amer: 23 mL/min — ABNORMAL LOW (ref >60)
GFR calc Af Amer: 27 mL/min — ABNORMAL LOW (ref >60)
GFR calc non Af Amer: 20 mL/min — ABNORMAL LOW (ref >60)
GFR calc non Af Amer: 23 mL/min — ABNORMAL LOW (ref >60)
Glucose, Bld: 145 mg/dL — ABNORMAL HIGH (ref 70–99)
Glucose, Bld: 160 mg/dL — ABNORMAL HIGH (ref 70–99)
Phosphorus: 7.1 mg/dL — ABNORMAL HIGH (ref 2.5–4.6)
Phosphorus: 6 mg/dL — ABNORMAL HIGH (ref 2.5–4.6)
Potassium: 4.5 mmol/L (ref 3.5–5.1)
Potassium: 5.7 mmol/L — ABNORMAL HIGH (ref 3.5–5.1)
Sodium: 141 mmol/L (ref 135–145)
Sodium: 140 mmol/L (ref 135–145)

## 2019-05-29 LAB — PREPARE RBC (CROSSMATCH)

## 2019-05-29 LAB — POCT ACTIVATED CLOTTING TIME
Activated Clotting Time: 158 seconds
Activated Clotting Time: 125 seconds
Activated Clotting Time: 142 seconds
Activated Clotting Time: 147 seconds
Activated Clotting Time: 153 seconds
Activated Clotting Time: 136 seconds
Activated Clotting Time: 131 seconds
Activated Clotting Time: 120 seconds
Activated Clotting Time: 120 seconds
Activated Clotting Time: >1000 seconds
Activated Clotting Time: 191 seconds

## 2019-05-29 LAB — FIBRINOGEN: Fibrinogen: 469 mg/dL (ref 210–475)

## 2019-05-29 LAB — SURGICAL PCR SCREEN
MRSA, PCR: NEGATIVE
Staphylococcus aureus: POSITIVE — AB

## 2019-05-29 LAB — APTT
aPTT: 42 seconds — ABNORMAL HIGH (ref 24–36)
aPTT: >200 s (ref 24–36)
aPTT: 107 seconds — ABNORMAL HIGH (ref 24–36)

## 2019-05-29 LAB — ECHOCARDIOGRAM LIMITED
Height: 67 in
Weight: 2539.7 oz

## 2019-05-29 LAB — PROTIME-INR
INR: 2.1 — ABNORMAL HIGH (ref 0.8–1.2)
INR: 1.6 — ABNORMAL HIGH (ref 0.8–1.2)
INR: 2 — ABNORMAL HIGH (ref 0.8–1.2)
INR: 1.8 — ABNORMAL HIGH (ref 0.8–1.2)
Prothrombin Time: 23.2 seconds — ABNORMAL HIGH (ref 11.4–15.2)
Prothrombin Time: 18.8 seconds — ABNORMAL HIGH (ref 11.4–15.2)
Prothrombin Time: 22 seconds — ABNORMAL HIGH (ref 11.4–15.2)
Prothrombin Time: 20.4 seconds — ABNORMAL HIGH (ref 11.4–15.2)

## 2019-05-29 LAB — LACTIC ACID, PLASMA: Lactic Acid, Venous: 1.2 mmol/L (ref 0.5–1.9)

## 2019-05-29 LAB — GLUCOSE, CAPILLARY
Glucose-Capillary: 127 mg/dL — ABNORMAL HIGH (ref 70–99)
Glucose-Capillary: 171 mg/dL — ABNORMAL HIGH (ref 70–99)
Glucose-Capillary: 152 mg/dL — ABNORMAL HIGH (ref 70–99)
Glucose-Capillary: 141 mg/dL — ABNORMAL HIGH (ref 70–99)

## 2019-05-29 LAB — HEMOGLOBIN AND HEMATOCRIT, BLOOD
HCT: 34.2 % — ABNORMAL LOW (ref 39.0–52.0)
Hemoglobin: 11.4 g/dL — ABNORMAL LOW (ref 13.0–17.0)

## 2019-05-29 LAB — HEPARIN LEVEL (UNFRACTIONATED)
Heparin Unfractionated: <0.1 IU/mL — ABNORMAL LOW (ref 0.30–0.70)
Heparin Unfractionated: 0.13 IU/mL — ABNORMAL LOW (ref 0.30–0.70)
Heparin Unfractionated: 0.45 IU/mL (ref 0.30–0.70)

## 2019-05-29 LAB — LACTATE DEHYDROGENASE: LDH: 744 U/L — ABNORMAL HIGH (ref 98–192)

## 2019-05-29 LAB — MAGNESIUM: Magnesium: 2.4 mg/dL (ref 1.7–2.4)

## 2019-05-29 SURGERY — CANNULATION FOR ECMO (EXTRACORPOREAL MEMBRANE OXYGENATION)
Anesthesia: General

## 2019-05-29 MED ORDER — TRANEXAMIC ACID 1000 MG/10ML IV SOLN
1.5000 mg/kg/h | INTRAVENOUS | Status: DC
  Filled 2019-05-29: qty 25

## 2019-05-29 MED ORDER — LACTATED RINGERS IV SOLN: INTRAVENOUS | Status: DC

## 2019-05-29 MED ORDER — VANCOMYCIN HCL 10 G IV SOLR
1250.0000 mg | INTRAVENOUS | Status: AC
  Administered 2019-05-29: 1250 mg via INTRAVENOUS
  Filled 2019-05-29: qty 1250

## 2019-05-29 MED ORDER — SODIUM CHLORIDE 0.9 % IV SOLN
INTRAVENOUS | Status: DC | PRN
  Administered 2019-05-29: 500 mL

## 2019-05-29 MED ORDER — FENTANYL CITRATE (PF) 100 MCG/2ML IJ SOLN
25.0000 ug | Freq: Once | INTRAMUSCULAR | Status: AC
  Administered 2019-05-29: 02:00:00 25 ug via INTRAVENOUS

## 2019-05-29 MED ORDER — SODIUM CHLORIDE 0.9 % IV SOLN
1.0000 g | Freq: Three times a day (TID) | INTRAVENOUS | Status: DC
  Administered 2019-05-30 – 2019-06-03 (×14): 1 g via INTRAVENOUS
  Filled 2019-05-29 (×17): qty 1

## 2019-05-29 MED ORDER — DOPAMINE-DEXTROSE 3.2-5 MG/ML-% IV SOLN
0.0000 ug/kg/min | INTRAVENOUS | Status: DC
  Administered 2019-05-29: 20 ug/kg/min via INTRAVENOUS
  Filled 2019-05-29 (×2): qty 250

## 2019-05-29 MED ORDER — SODIUM CHLORIDE 0.9 % IV SOLN
8.0000 mg/h | INTRAVENOUS | Status: AC
  Administered 2019-05-29: 18:00:00 via INTRAVENOUS
  Administered 2019-05-29 – 2019-05-31 (×7): 8 mg/h via INTRAVENOUS
  Filled 2019-05-29 (×8): qty 80

## 2019-05-29 MED ORDER — ACETAMINOPHEN 500 MG PO TABS: 1000.0000 mg | ORAL_TABLET | Freq: Four times a day (QID) | ORAL | Status: AC

## 2019-05-29 MED ORDER — FENTANYL CITRATE (PF) 100 MCG/2ML IJ SOLN
INTRAMUSCULAR | Status: AC
  Administered 2019-05-29: 25 ug via INTRAVENOUS
  Filled 2019-05-29: qty 2

## 2019-05-29 MED ORDER — BISACODYL 10 MG RE SUPP: 10.0000 mg | Freq: Every day | RECTAL | Status: DC

## 2019-05-29 MED ORDER — INSULIN REGULAR(HUMAN) IN NACL 100-0.9 UT/100ML-% IV SOLN
INTRAVENOUS | Status: DC
  Filled 2019-05-29: qty 100

## 2019-05-29 MED ORDER — SODIUM CHLORIDE 0.9 % IV SOLN
INTRAVENOUS | Status: DC
  Filled 2019-05-29: qty 30

## 2019-05-29 MED ORDER — MAGNESIUM SULFATE 50 % IJ SOLN
40.0000 meq | INTRAMUSCULAR | Status: DC
  Filled 2019-05-29: qty 9.85

## 2019-05-29 MED ORDER — FENTANYL 2500MCG IN NS 250ML (10MCG/ML) PREMIX INFUSION
25.0000 ug/h | INTRAVENOUS | Status: DC
  Administered 2019-05-29 – 2019-06-01 (×7): 250 ug/h via INTRAVENOUS
  Administered 2019-06-01: 275 ug/h via INTRAVENOUS
  Administered 2019-06-02 – 2019-06-03 (×4): 250 ug/h via INTRAVENOUS
  Administered 2019-06-03: 15:00:00 via INTRAVENOUS
  Administered 2019-06-04: 250 ug/h via INTRAVENOUS
  Administered 2019-06-04: 300 ug/h via INTRAVENOUS
  Administered 2019-06-04: 250 ug/h via INTRAVENOUS
  Administered 2019-06-05: 300 ug/h via INTRAVENOUS
  Filled 2019-05-29 (×17): qty 250

## 2019-05-29 MED ORDER — SODIUM CHLORIDE 0.9% FLUSH
3.0000 mL | Freq: Two times a day (BID) | INTRAVENOUS | Status: DC
  Administered 2019-05-31 – 2019-06-02 (×3): 3 mL via INTRAVENOUS

## 2019-05-29 MED ORDER — MAGNESIUM SULFATE 4 GM/100ML IV SOLN
4.0000 g | Freq: Once | INTRAVENOUS | Status: DC
  Filled 2019-05-29: qty 100

## 2019-05-29 MED ORDER — PANTOPRAZOLE SODIUM 40 MG PO TBEC: 40.0000 mg | DELAYED_RELEASE_TABLET | Freq: Every day | ORAL | Status: DC

## 2019-05-29 MED ORDER — MORPHINE SULFATE (PF) 2 MG/ML IV SOLN: 1.0000 mg | INTRAVENOUS | Status: DC | PRN

## 2019-05-29 MED ORDER — EPINEPHRINE PF 1 MG/ML IJ SOLN
0.0000 ug/min | INTRAVENOUS | Status: DC
  Filled 2019-05-29: qty 4

## 2019-05-29 MED ORDER — PRISMASOL BGK 4/2.5 32-4-2.5 MEQ/L REPLACEMENT SOLN
Status: DC
  Administered 2019-05-29: 07:00:00 via INTRAVENOUS_CENTRAL

## 2019-05-29 MED ORDER — SODIUM CHLORIDE 0.9 % FOR CRRT
INTRAVENOUS_CENTRAL | Status: DC | PRN
  Filled 2019-05-29: qty 1000

## 2019-05-29 MED ORDER — VANCOMYCIN HCL 1000 MG IV SOLR
INTRAVENOUS | Status: AC
  Filled 2019-05-29: qty 3000

## 2019-05-29 MED ORDER — CHLORHEXIDINE GLUCONATE 0.12% ORAL RINSE (MEDLINE KIT)
15.0000 mL | Freq: Two times a day (BID) | OROMUCOSAL | Status: DC
  Administered 2019-05-29 (×2): 15 mL via OROMUCOSAL

## 2019-05-29 MED ORDER — SODIUM CHLORIDE 0.9% IV SOLUTION
Freq: Once | INTRAVENOUS | Status: AC
  Administered 2019-05-29: 21:00:00 via INTRAVENOUS

## 2019-05-29 MED ORDER — DOBUTAMINE IN D5W 4-5 MG/ML-% IV SOLN: 2.5000 ug/kg/min | INTRAVENOUS | Status: DC

## 2019-05-29 MED ORDER — SODIUM CHLORIDE 0.9 % IV SOLN
20.0000 ug | Freq: Once | INTRAVENOUS | Status: AC
  Administered 2019-05-29: 20 ug via INTRAVENOUS
  Filled 2019-05-29: qty 5

## 2019-05-29 MED ORDER — NOREPINEPHRINE 4 MG/250ML-% IV SOLN: 0.0000 ug/min | INTRAVENOUS | Status: DC

## 2019-05-29 MED ORDER — SODIUM CHLORIDE 0.9% FLUSH: 3.0000 mL | INTRAVENOUS | Status: DC | PRN

## 2019-05-29 MED ORDER — SODIUM CHLORIDE 0.9 % IV SOLN
1.0000 g | Freq: Once | INTRAVENOUS | Status: AC
  Administered 2019-05-29: 1 g via INTRAVENOUS
  Filled 2019-05-29: qty 10

## 2019-05-29 MED ORDER — MIDAZOLAM 50MG/50ML (1MG/ML) PREMIX INFUSION
0.5000 mg/h | INTRAVENOUS | Status: DC
  Administered 2019-05-29: 2 mg/h via INTRAVENOUS
  Administered 2019-05-29: 18:00:00 via INTRAVENOUS
  Filled 2019-05-29 (×2): qty 50

## 2019-05-29 MED ORDER — PROPOFOL 10 MG/ML IV BOLUS: 50.0000 mg | Freq: Once | INTRAVENOUS | Status: DC

## 2019-05-29 MED ORDER — HEMOSTATIC AGENTS (NO CHARGE) OPTIME
TOPICAL | Status: DC | PRN
  Administered 2019-05-29 (×2): 1 via TOPICAL

## 2019-05-29 MED ORDER — NITROGLYCERIN IN D5W 200-5 MCG/ML-% IV SOLN
2.0000 ug/min | INTRAVENOUS | Status: DC
  Filled 2019-05-29: qty 250

## 2019-05-29 MED ORDER — DOCUSATE SODIUM 100 MG PO CAPS
200.0000 mg | ORAL_CAPSULE | Freq: Every day | ORAL | Status: DC
  Administered 2019-05-31: 200 mg via ORAL
  Filled 2019-05-29: qty 2

## 2019-05-29 MED ORDER — INSULIN REGULAR(HUMAN) IN NACL 100-0.9 UT/100ML-% IV SOLN: INTRAVENOUS | Status: DC

## 2019-05-29 MED ORDER — ALBUMIN HUMAN 5 % IV SOLN
12.5000 g | Freq: Once | INTRAVENOUS | Status: AC
  Administered 2019-05-29: 12.5 g via INTRAVENOUS

## 2019-05-29 MED ORDER — ONDANSETRON HCL 4 MG/2ML IJ SOLN: 4.0000 mg | Freq: Four times a day (QID) | INTRAMUSCULAR | Status: DC | PRN

## 2019-05-29 MED ORDER — PRISMASOL BGK 4/2.5 32-4-2.5 MEQ/L IV SOLN
INTRAVENOUS | Status: DC
  Administered 2019-05-29 (×2): via INTRAVENOUS_CENTRAL
  Administered 2019-05-29: 15:00:00 1 mL via INTRAVENOUS_CENTRAL
  Administered 2019-05-30: 08:00:00 via INTRAVENOUS_CENTRAL

## 2019-05-29 MED ORDER — VANCOMYCIN HCL IN DEXTROSE 750-5 MG/150ML-% IV SOLN
750.0000 mg | INTRAVENOUS | Status: DC
  Administered 2019-05-30 – 2019-06-06 (×7): 750 mg via INTRAVENOUS
  Filled 2019-05-29 (×8): qty 150

## 2019-05-29 MED ORDER — MIDAZOLAM HCL 2 MG/2ML IJ SOLN
INTRAMUSCULAR | Status: DC | PRN
  Administered 2019-05-29: 2 mg via INTRAVENOUS

## 2019-05-29 MED ORDER — FENTANYL BOLUS VIA INFUSION
25.0000 ug | INTRAVENOUS | Status: DC | PRN
  Filled 2019-05-29: qty 25

## 2019-05-29 MED ORDER — MIDAZOLAM HCL 2 MG/2ML IJ SOLN
2.0000 mg | INTRAMUSCULAR | Status: DC | PRN
  Administered 2019-06-01: 2 mg via INTRAVENOUS

## 2019-05-29 MED ORDER — VANCOMYCIN HCL 10 G IV SOLR
1250.0000 mg | Freq: Once | INTRAVENOUS | Status: AC
  Administered 2019-05-29: 1250 mg via INTRAVENOUS
  Filled 2019-05-29: qty 1250

## 2019-05-29 MED ORDER — SODIUM CHLORIDE 0.9% IV SOLUTION
Freq: Once | INTRAVENOUS | Status: AC
  Administered 2019-05-29: 02:00:00 via INTRAVENOUS

## 2019-05-29 MED ORDER — SODIUM CHLORIDE 0.9 % IV SOLN
INTRAVENOUS | Status: DC
  Administered 2019-05-29: 19:00:00 via INTRAVENOUS

## 2019-05-29 MED ORDER — FENTANYL CITRATE (PF) 250 MCG/5ML IJ SOLN
INTRAMUSCULAR | Status: DC | PRN
  Administered 2019-05-29: 150 ug via INTRAVENOUS
  Administered 2019-05-29 (×2): 100 ug via INTRAVENOUS

## 2019-05-29 MED ORDER — ACETAMINOPHEN 160 MG/5ML PO SOLN
1000.0000 mg | Freq: Four times a day (QID) | ORAL | Status: AC
  Administered 2019-05-30 – 2019-06-03 (×18): 1000 mg
  Filled 2019-05-29 (×17): qty 40.6

## 2019-05-29 MED ORDER — TRANEXAMIC ACID (OHS) PUMP PRIME SOLUTION
2.0000 mg/kg | INTRAVENOUS | Status: DC
  Filled 2019-05-29: qty 1.44

## 2019-05-29 MED ORDER — SODIUM CHLORIDE 0.9 % IV SOLN
1.0000 g | Freq: Two times a day (BID) | INTRAVENOUS | Status: DC
  Administered 2019-05-29: 1 g via INTRAVENOUS
  Filled 2019-05-29 (×2): qty 1

## 2019-05-29 MED ORDER — SODIUM CHLORIDE 0.9 % IV SOLN
0.5000 ug/kg/min | INTRAVENOUS | Status: DC
  Administered 2019-05-29: 3 ug/kg/min via INTRAVENOUS
  Filled 2019-05-29 (×2): qty 20

## 2019-05-29 MED ORDER — MIDAZOLAM BOLUS VIA INFUSION
1.0000 mg | INTRAVENOUS | Status: DC | PRN
  Filled 2019-05-29: qty 2

## 2019-05-29 MED ORDER — 0.9 % SODIUM CHLORIDE (POUR BTL) OPTIME
TOPICAL | Status: DC | PRN
  Administered 2019-05-29: 16:00:00 2000 mL

## 2019-05-29 MED ORDER — FENTANYL CITRATE (PF) 250 MCG/5ML IJ SOLN
INTRAMUSCULAR | Status: AC
  Filled 2019-05-29: qty 10

## 2019-05-29 MED ORDER — SODIUM CHLORIDE 0.9 % IV SOLN
80.0000 mg | Freq: Once | INTRAVENOUS | Status: AC
  Administered 2019-05-29: 80 mg via INTRAVENOUS
  Filled 2019-05-29: qty 80

## 2019-05-29 MED ORDER — SODIUM BICARBONATE 8.4 % IV SOLN
INTRAVENOUS | Status: AC
  Administered 2019-05-29: 03:00:00
  Filled 2019-05-29: qty 100

## 2019-05-29 MED ORDER — VANCOMYCIN HCL IN DEXTROSE 1-5 GM/200ML-% IV SOLN: 1000.0000 mg | Freq: Once | INTRAVENOUS | Status: DC

## 2019-05-29 MED ORDER — DOPAMINE-DEXTROSE 3.2-5 MG/ML-% IV SOLN
0.0000 ug/kg/min | INTRAVENOUS | Status: DC
  Filled 2019-05-29: qty 250

## 2019-05-29 MED ORDER — PHENYLEPHRINE HCL-NACL 20-0.9 MG/250ML-% IV SOLN
30.0000 ug/min | INTRAVENOUS | Status: DC
  Filled 2019-05-29: qty 250

## 2019-05-29 MED ORDER — PROPOFOL 500 MG/50ML IV EMUL
INTRAVENOUS | Status: AC
  Filled 2019-05-29: qty 50

## 2019-05-29 MED ORDER — HEPARIN (PORCINE) 25000 UT/250ML-% IV SOLN
500.0000 [IU]/h | INTRAVENOUS | Status: DC
  Administered 2019-05-30: 500 [IU]/h via INTRAVENOUS
  Filled 2019-05-29: qty 250

## 2019-05-29 MED ORDER — CHLORHEXIDINE GLUCONATE 0.12 % MT SOLN: 15.0000 mL | OROMUCOSAL | Status: AC

## 2019-05-29 MED ORDER — ACETAMINOPHEN 160 MG/5ML PO SOLN
650.0000 mg | Freq: Once | ORAL | Status: DC
  Filled 2019-05-29: qty 20.3

## 2019-05-29 MED ORDER — ALBUMIN HUMAN 5 % IV SOLN: 250.0000 mL | INTRAVENOUS | Status: AC | PRN

## 2019-05-29 MED ORDER — HEPARIN SODIUM (PORCINE) 1000 UNIT/ML IJ SOLN
INTRAMUSCULAR | Status: DC | PRN
  Administered 2019-05-29: 5000 [IU] via INTRAVENOUS

## 2019-05-29 MED ORDER — ROCURONIUM BROMIDE 10 MG/ML (PF) SYRINGE
PREFILLED_SYRINGE | INTRAVENOUS | Status: DC | PRN
  Administered 2019-05-29: 100 mg via INTRAVENOUS

## 2019-05-29 MED ORDER — KETAMINE HCL-SODIUM CHLORIDE 100-0.9 MG/10ML-% IV SOSY
50.0000 mg | PREFILLED_SYRINGE | Freq: Once | INTRAVENOUS | Status: DC
  Filled 2019-05-29 (×2): qty 10

## 2019-05-29 MED ORDER — HEPARIN (PORCINE) 25000 UT/250ML-% IV SOLN: 1500.0000 [IU]/h | INTRAVENOUS | Status: DC

## 2019-05-29 MED ORDER — SODIUM CHLORIDE 0.9 % IV SOLN
750.0000 mg | INTRAVENOUS | Status: DC
  Filled 2019-05-29: qty 750

## 2019-05-29 MED ORDER — PROPOFOL 10 MG/ML IV BOLUS
30.0000 mg | Freq: Once | INTRAVENOUS | Status: AC
  Administered 2019-05-29: 30 mg via INTRAVENOUS

## 2019-05-29 MED ORDER — MIDAZOLAM 50MG/50ML (1MG/ML) PREMIX INFUSION
2.0000 mg/h | INTRAVENOUS | Status: DC
  Administered 2019-05-30 (×3): 5 mg/h via INTRAVENOUS
  Administered 2019-05-31 (×3): 7 mg/h via INTRAVENOUS
  Administered 2019-05-31: 5 mg/h via INTRAVENOUS
  Administered 2019-06-01 – 2019-06-04 (×13): 6 mg/h via INTRAVENOUS
  Administered 2019-06-04 – 2019-06-05 (×2): 10 mg/h via INTRAVENOUS
  Filled 2019-05-29 (×25): qty 50

## 2019-05-29 MED ORDER — SODIUM CHLORIDE 0.9% IV SOLUTION
Freq: Once | INTRAVENOUS | Status: AC
  Administered 2019-05-29: 20:00:00 via INTRAVENOUS

## 2019-05-29 MED ORDER — POTASSIUM CHLORIDE 10 MEQ/50ML IV SOLN: 10.0000 meq | INTRAVENOUS | Status: AC

## 2019-05-29 MED ORDER — DOBUTAMINE IN D5W 4-5 MG/ML-% IV SOLN
INTRAVENOUS | Status: AC
  Filled 2019-05-29: qty 250

## 2019-05-29 MED ORDER — METOPROLOL TARTRATE 25 MG/10 ML ORAL SUSPENSION
12.5000 mg | Freq: Two times a day (BID) | ORAL | Status: DC
  Administered 2019-05-30: 12.5 mg
  Filled 2019-05-29: qty 5

## 2019-05-29 MED ORDER — DEXMEDETOMIDINE HCL IN NACL 400 MCG/100ML IV SOLN
0.1000 ug/kg/h | INTRAVENOUS | Status: DC
  Filled 2019-05-29: qty 100

## 2019-05-29 MED ORDER — FENTANYL CITRATE (PF) 100 MCG/2ML IJ SOLN: 25.0000 ug | Freq: Once | INTRAMUSCULAR | Status: DC

## 2019-05-29 MED ORDER — MIDAZOLAM HCL 2 MG/2ML IJ SOLN
1.0000 mg | INTRAMUSCULAR | Status: DC | PRN
  Administered 2019-05-29 (×2): 1 mg via INTRAVENOUS
  Filled 2019-05-29: qty 2

## 2019-05-29 MED ORDER — SODIUM CHLORIDE 0.9 % IV SOLN
INTRAVENOUS | Status: AC
  Filled 2019-05-29: qty 1.2

## 2019-05-29 MED ORDER — PRISMASOL BGK 4/2.5 32-4-2.5 MEQ/L REPLACEMENT SOLN
Status: DC
  Administered 2019-05-29 (×2): via INTRAVENOUS_CENTRAL

## 2019-05-29 MED ORDER — EPINEPHRINE HCL 5 MG/250ML IV SOLN IN NS
0.0000 ug/min | INTRAVENOUS | Status: DC
  Filled 2019-05-29: qty 250

## 2019-05-29 MED ORDER — ACETAMINOPHEN 650 MG RE SUPP
650.0000 mg | Freq: Once | RECTAL | Status: DC
  Filled 2019-05-29: qty 1

## 2019-05-29 MED ORDER — SODIUM CHLORIDE 0.9% IV SOLUTION: Freq: Once | INTRAVENOUS | Status: AC

## 2019-05-29 MED ORDER — KETAMINE HCL-SODIUM CHLORIDE 100-0.9 MG/10ML-% IV SOSY
70.0000 mg | PREFILLED_SYRINGE | Freq: Once | INTRAVENOUS | Status: AC
  Administered 2019-05-29: 70 mg via INTRAVENOUS

## 2019-05-29 MED ORDER — MIDAZOLAM HCL 2 MG/2ML IJ SOLN
INTRAMUSCULAR | Status: AC
  Filled 2019-05-29: qty 2

## 2019-05-29 MED ORDER — INSULIN REGULAR BOLUS VIA INFUSION
0.0000 [IU] | Freq: Three times a day (TID) | INTRAVENOUS | Status: DC
  Filled 2019-05-29: qty 10

## 2019-05-29 MED ORDER — POTASSIUM CHLORIDE 2 MEQ/ML IV SOLN
80.0000 meq | INTRAVENOUS | Status: DC
  Filled 2019-05-29: qty 40

## 2019-05-29 MED ORDER — ARTIFICIAL TEARS OPHTHALMIC OINT
1.0000 "application " | TOPICAL_OINTMENT | Freq: Three times a day (TID) | OPHTHALMIC | Status: DC
  Administered 2019-05-29: 1 via OPHTHALMIC
  Filled 2019-05-29: qty 3.5

## 2019-05-29 MED ORDER — EPINEPHRINE HCL 5 MG/250ML IV SOLN IN NS
0.5000 ug/min | INTRAVENOUS | Status: DC
  Administered 2019-05-29: 9 ug/min via INTRAVENOUS
  Administered 2019-05-29: 19 ug/min via INTRAVENOUS
  Administered 2019-05-29: 20 ug/min via INTRAVENOUS
  Filled 2019-05-29 (×3): qty 250

## 2019-05-29 MED ORDER — FAMOTIDINE IN NACL 20-0.9 MG/50ML-% IV SOLN: 20.0000 mg | Freq: Two times a day (BID) | INTRAVENOUS | Status: DC

## 2019-05-29 MED ORDER — ORAL CARE MOUTH RINSE
15.0000 mL | OROMUCOSAL | Status: DC
  Administered 2019-05-29 – 2019-05-30 (×7): 15 mL via OROMUCOSAL

## 2019-05-29 MED ORDER — SODIUM CHLORIDE 0.9 % IV SOLN: 250.0000 mL | INTRAVENOUS | Status: DC

## 2019-05-29 MED ORDER — PANTOPRAZOLE SODIUM 40 MG IV SOLR
40.0000 mg | Freq: Two times a day (BID) | INTRAVENOUS | Status: DC
  Administered 2019-06-01 – 2019-06-06 (×8): 40 mg via INTRAVENOUS
  Filled 2019-05-29 (×9): qty 40

## 2019-05-29 MED ORDER — DEXMEDETOMIDINE HCL IN NACL 400 MCG/100ML IV SOLN: 0.0000 ug/kg/h | INTRAVENOUS | Status: DC

## 2019-05-29 MED ORDER — SODIUM CHLORIDE 0.9 % IV SOLN
1.5000 g | INTRAVENOUS | Status: DC
  Filled 2019-05-29: qty 1.5

## 2019-05-29 MED ORDER — SODIUM CHLORIDE 0.9 % IV SOLN: 1.5000 g | Freq: Two times a day (BID) | INTRAVENOUS | Status: DC

## 2019-05-29 MED ORDER — STERILE WATER FOR IRRIGATION IR SOLN
Status: DC | PRN
  Administered 2019-05-29: 2000 mL

## 2019-05-29 MED ORDER — MIDAZOLAM HCL 2 MG/2ML IJ SOLN
1.0000 mg | INTRAMUSCULAR | Status: DC | PRN
  Administered 2019-05-29: 1 mg via INTRAVENOUS
  Filled 2019-05-29: qty 2

## 2019-05-29 MED ORDER — FENTANYL 2500MCG IN NS 250ML (10MCG/ML) PREMIX INFUSION: 25.0000 ug/h | INTRAVENOUS | Status: DC

## 2019-05-29 MED ORDER — NITROGLYCERIN IN D5W 200-5 MCG/ML-% IV SOLN: 0.0000 ug/min | INTRAVENOUS | Status: DC

## 2019-05-29 MED ORDER — TRANEXAMIC ACID (OHS) BOLUS VIA INFUSION
15.0000 mg/kg | INTRAVENOUS | Status: DC
  Filled 2019-05-29: qty 1080

## 2019-05-29 MED ORDER — METHYLPREDNISOLONE SODIUM SUCC 40 MG IJ SOLR
40.0000 mg | Freq: Two times a day (BID) | INTRAMUSCULAR | Status: DC
  Administered 2019-05-29 – 2019-05-30 (×3): 40 mg via INTRAVENOUS
  Filled 2019-05-29 (×3): qty 1

## 2019-05-29 MED ORDER — SODIUM CHLORIDE 0.9% IV SOLUTION
Freq: Once | INTRAVENOUS | Status: AC
  Administered 2019-05-29: 22:00:00 via INTRAVENOUS

## 2019-05-29 MED ORDER — BISACODYL 5 MG PO TBEC
10.0000 mg | DELAYED_RELEASE_TABLET | Freq: Every day | ORAL | Status: DC
  Administered 2019-05-31: 10 mg via ORAL
  Filled 2019-05-29: qty 2

## 2019-05-29 MED ORDER — HEPARIN SODIUM (PORCINE) 1000 UNIT/ML IJ SOLN
INTRAMUSCULAR | Status: AC
  Filled 2019-05-29: qty 1

## 2019-05-29 MED ORDER — PLASMA-LYTE 148 IV SOLN
INTRAVENOUS | Status: DC
  Filled 2019-05-29: qty 2.5

## 2019-05-29 MED ORDER — METOPROLOL TARTRATE 12.5 MG HALF TABLET: 12.5000 mg | ORAL_TABLET | Freq: Two times a day (BID) | ORAL | Status: DC

## 2019-05-29 MED ORDER — MIDAZOLAM HCL 2 MG/2ML IJ SOLN
4.0000 mg | Freq: Once | INTRAMUSCULAR | Status: AC
  Administered 2019-05-29: 4 mg via INTRAVENOUS
  Filled 2019-05-29: qty 4

## 2019-05-29 MED ORDER — VANCOMYCIN HCL 1000 MG IV SOLR
INTRAVENOUS | Status: DC
  Filled 2019-05-29: qty 1000

## 2019-05-29 MED ORDER — MILRINONE LACTATE IN DEXTROSE 20-5 MG/100ML-% IV SOLN
0.3000 ug/kg/min | INTRAVENOUS | Status: DC
  Filled 2019-05-29: qty 100

## 2019-05-29 MED ORDER — PROPOFOL 10 MG/ML IV BOLUS
INTRAVENOUS | Status: AC
  Filled 2019-05-29: qty 20

## 2019-05-29 MED ORDER — FENTANYL 2500MCG IN NS 250ML (10MCG/ML) PREMIX INFUSION
0.0000 ug/h | INTRAVENOUS | Status: DC
  Administered 2019-05-29: 250 ug/h via INTRAVENOUS
  Filled 2019-05-29 (×2): qty 250

## 2019-05-29 SURGICAL SUPPLY — 82 items
ANCHOR CATH FOLEY SECURE (MISCELLANEOUS) ×8 IMPLANT
APPLIER CLIP 13 LRG OPEN (CLIP)
APR CLP LRG 13 20 CLIP (CLIP)
BAG DECANTER FOR FLEXI CONT (MISCELLANEOUS) ×2 IMPLANT
BLADE CLIPPER SURG (BLADE) ×2 IMPLANT
BLADE SAW OSCILLATING 7SYS (BLADE) ×1 IMPLANT
BLADE SAW STERNAL (BLADE) ×1 IMPLANT
BLADE SURG 11 STRL SS (BLADE) ×2 IMPLANT
CANNULA FEMORAL ART 14 SM (MISCELLANEOUS) IMPLANT
CANNULA NON VENT 20FR 12 (CANNULA) ×1 IMPLANT
CLIP APPLIE 13 LRG OPEN (CLIP) IMPLANT
CLIP VESOCCLUDE LG 6/CT (CLIP) ×3 IMPLANT
CLIP VESOCCLUDE MED 24/CT (CLIP) ×1 IMPLANT
CLIP VESOCCLUDE SM WIDE 24/CT (CLIP) ×1 IMPLANT
CONN 3/8X3/8 STRAIGHT W/LL (MISCELLANEOUS) ×2 IMPLANT
COVER PROBE W GEL 5X96 (DRAPES) ×2 IMPLANT
COVER SURGICAL LIGHT HANDLE (MISCELLANEOUS) ×1 IMPLANT
COVER WAND RF STERILE (DRAPES) ×1 IMPLANT
DRAIN CHANNEL 19F RND (DRAIN) ×1 IMPLANT
DRAIN CHANNEL 28F RND 3/8 FF (WOUND CARE) ×1 IMPLANT
DRAPE BILATERAL SPLIT (DRAPES) ×1 IMPLANT
DRAPE C-ARM 42X72 X-RAY (DRAPES) ×1 IMPLANT
DRAPE CV SPLIT W-CLR ANES SCRN (DRAPES) ×1 IMPLANT
DRAPE HALF SHEET 40X57 (DRAPES) ×1 IMPLANT
DRAPE INCISE IOBAN 66X45 STRL (DRAPES) ×4 IMPLANT
DRAPE SLUSH/WARMER DISC (DRAPES) ×2 IMPLANT
DRSG AQUACEL AG ADV 3.5X 6 (GAUZE/BANDAGES/DRESSINGS) ×1 IMPLANT
DRSG TEGADERM 4X4.5 CHG (GAUZE/BANDAGES/DRESSINGS) ×1 IMPLANT
DRSG TEGADERM 4X4.75 (GAUZE/BANDAGES/DRESSINGS) ×1 IMPLANT
ELECT BLADE 4.0 EZ CLEAN MEGAD (MISCELLANEOUS) ×2
ELECT CAUTERY BLADE 6.4 (BLADE) ×2 IMPLANT
ELECT REM PT RETURN 9FT ADLT (ELECTROSURGICAL) ×4
ELECTRODE BLDE 4.0 EZ CLN MEGD (MISCELLANEOUS) IMPLANT
ELECTRODE REM PT RTRN 9FT ADLT (ELECTROSURGICAL) ×1 IMPLANT
EVACUATOR SILICONE 100CC (DRAIN) ×1 IMPLANT
FELT TEFLON 1X6 (MISCELLANEOUS) ×1 IMPLANT
FEMORAL VENOUS CANN RAP (CANNULA) ×1 IMPLANT
GAUZE SPONGE 4X4 12PLY STRL (GAUZE/BANDAGES/DRESSINGS) ×1 IMPLANT
GLOVE BIO SURGEON STRL SZ 6.5 (GLOVE) ×2 IMPLANT
GLOVE BIO SURGEON STRL SZ7.5 (GLOVE) ×1 IMPLANT
GLOVE BIOGEL PI IND STRL 7.0 (GLOVE) IMPLANT
GLOVE BIOGEL PI INDICATOR 7.0 (GLOVE) ×1
GLOVE NEODERM STRL 7.5 LF PF (GLOVE) ×1 IMPLANT
GLOVE SURG NEODERM 7.5  LF PF (GLOVE) ×3
GOWN STRL REUS W/ TWL LRG LVL3 (GOWN DISPOSABLE) ×4 IMPLANT
GOWN STRL REUS W/TWL LRG LVL3 (GOWN DISPOSABLE) ×12
HEMOSTAT SNOW SURGICEL 2X4 (HEMOSTASIS) ×1 IMPLANT
KIT BASIN OR (CUSTOM PROCEDURE TRAY) ×2 IMPLANT
KIT DILATOR VASC 18G NDL (KITS) ×2 IMPLANT
NEEDLE 22X1 1/2 (OR ONLY) (NEEDLE) ×1 IMPLANT
NS IRRIG 1000ML POUR BTL (IV SOLUTION) ×4 IMPLANT
PACK CHEST (CUSTOM PROCEDURE TRAY) ×1 IMPLANT
PAD ARMBOARD 7.5X6 YLW CONV (MISCELLANEOUS) ×2 IMPLANT
POWDER SURGICEL 3.0 GRAM (HEMOSTASIS) ×1 IMPLANT
SCRUB POVIDONE IODINE 4 OZ (MISCELLANEOUS) ×2 IMPLANT
SET CANNULATION TOURNIQUET (MISCELLANEOUS) ×1 IMPLANT
SET PRESSURE INFUSION 24 (MISCELLANEOUS) ×2 IMPLANT
SHEATH AVANTI 11CM 5FR (SHEATH) ×2 IMPLANT
SHEATH PINNACLE 8F 10CM (SHEATH) ×1 IMPLANT
SOL PREP POV-IOD 4OZ 10% (MISCELLANEOUS) ×2 IMPLANT
STAPLER VISISTAT 35W (STAPLE) ×1 IMPLANT
SUT BONE WAX W31G (SUTURE) ×2 IMPLANT
SUT ETHIBOND X763 2 0 SH 1 (SUTURE) ×2 IMPLANT
SUT PDS AB 1 CTX 36 (SUTURE) ×1 IMPLANT
SUT PROLENE 5 0 C 1 36 (SUTURE) IMPLANT
SUT PROLENE 6 0 C 1 30 (SUTURE) IMPLANT
SUT SILK  1 MH (SUTURE) ×5
SUT SILK 1 MH (SUTURE) ×6 IMPLANT
SUT SILK 1 TIES 10X30 (SUTURE) ×1 IMPLANT
SUT SILK 2 0 SH CR/8 (SUTURE) ×1 IMPLANT
SUT STEEL 6MS V (SUTURE) ×1 IMPLANT
SYR 10ML LL (SYRINGE) ×4 IMPLANT
SYR 20ML LL LF (SYRINGE) ×2 IMPLANT
SYR 30ML LL (SYRINGE) ×2 IMPLANT
SYSTEM SAHARA CHEST DRAIN RE-I (WOUND CARE) IMPLANT
TOWEL GREEN STERILE (TOWEL DISPOSABLE) ×2 IMPLANT
TOWEL GREEN STERILE FF (TOWEL DISPOSABLE) ×2 IMPLANT
TOWEL OR NON WOVEN STRL DISP B (DISPOSABLE) ×1 IMPLANT
TRAY FOLEY MTR SLVR 16FR STAT (SET/KITS/TRAYS/PACK) ×2 IMPLANT
WATER STERILE IRR 1000ML POUR (IV SOLUTION) ×4 IMPLANT
WIRE EMERALD 3MM-J .035X150CM (WIRE) ×1 IMPLANT
WIRE J 3MM .035X145CM (WIRE) ×2 IMPLANT

## 2019-05-29 NOTE — Progress Notes (Signed)
Nutrition Follow-up   DOCUMENTATION CODES:   Not applicable  INTERVENTION:    Recommend placement of Cortrak and initiation of enteral nutrition within 24-48 hrs of re-intubation.   Add B complex with Vitamin C  Once tube placed:  Vital 1.5 @ 50 ml/hr (1200 ml) 60 ml Prostat BID  Provides: 2200 kcals, 141 grams protein, 917 ml free water.   NUTRITION DIAGNOSIS:   Inadequate oral intake related to inability to eat as evidenced by NPO status.  Ongoing  GOAL:   Patient will meet greater than or equal to 90% of their needs   Not met   MONITOR:   Vent status, Labs, Weight trends, Skin, I & O's  REASON FOR ASSESSMENT:   Ventilator    ASSESSMENT:   68 year old male who presented to the ED on 10/22 with chest pain and SOB. PMH of CAD, HTN, MI in 1998. Pt admitted with acute hypoxic respiratory failure due to acute pulmonary edema, PNA, possible ACS/NSTEMI. Pt required intubation.   10/23- s/p right/left heart cath 10/24- impella changed from femoral to R axillary 10/28- s/p bronchoscopy  10/29- vas cath, start CRRT  11/1- extubated 11/3- hold CRRT   Pt discussed during ICU rounds and with RN.   Cardiac arrest last night. Re-intubated and requiring multiple pressors. Back on CRRT- UF goal 50-100 ml/hr. Impella remains in place. Plan cannulation for ECMO today with end goal of CABG for revascularization. Pt will likely need Cortrak. Will assess for possibility of diagnostic radiology beside placement tomorrow.   Admission weight: 72.6 kg  Current weight: 72 kg  Patient is currently intubated on ventilator support MV: 12.3 L/min Temp (24hrs), Avg:97.7 F (36.5 C), Min:95.7 F (35.4 C), Max:99.3 F (37.4 C)   I/O: +8,007 ml since admit UOP: anuric CRRT: 2,043 ml today   Drips: epinephrine, fentanyl, versed, levophed  Medications: SS novolog, solumedrol, 5 mg reglan TID, senokot Labs: Phosphorus 7.1 (H) corrected calcium 8.6 (L) Cr 3.01- trending  slightly   Diet Order:   Diet Order            Diet full liquid Room service appropriate? Yes; Fluid consistency: Thin  Diet effective now              EDUCATION NEEDS:   No education needs have been identified at this time  Skin:  Skin Assessment: Skin Integrity Issues: Skin Integrity Issues:: Incisions Incisions: R chest/R groin  Last BM:  11/4  Height:   Ht Readings from Last 1 Encounters:  04/28/2019 _0  (1.702 m)    Weight:   Wt Readings from Last 1 Encounters:  05/28/19 72 kg    Ideal Body Weight:  67.3 kg  BMI:  Body mass index is 24.86 kg/m.  Estimated Nutritional Needs:   Kcal:  1763-2115 kcal (25-30 kcal/kg EDW)  Protein:  135-150 grams  Fluid:  >/= 2 L/day  Raymond Chavez RD, LDN Clinical Nutrition Pager # - [phone]

## 2019-05-29 NOTE — Anesthesia Postprocedure Evaluation (Signed)
Anesthesia Post Note  Patient: Quint Chestnut  Procedure(s) Performed: CANNULATION FOR ECMO (EXTRACORPOREAL MEMBRANE OXYGENATION) (N/A ) TRANSESOPHAGEAL ECHOCARDIOGRAM (TEE) (N/A )     Patient location during evaluation: ICU Anesthesia Type: General Level of consciousness: sedated and patient remains intubated per anesthesia plan Pain management: pain level controlled Vital Signs Assessment: post-procedure vital signs reviewed and stable Respiratory status: patient remains intubated per anesthesia plan Cardiovascular status: stable (On multiple vasopressors to maintain adequate MAP) Postop Assessment: no apparent nausea or vomiting Anesthetic complications: no    Last Vitals:  Vitals:   06/22/2019 1548 06/12/2019 1843  BP:    Pulse:    Resp:    Temp:    SpO2: 100% 100%    Last Pain:  Vitals:   06/23/2019 1200  TempSrc: Core  PainSc:                  Audry Pili

## 2019-05-29 NOTE — Progress Notes (Addendum)
ANTICOAGULATION CONSULT NOTE - Follow up [place] for heparin Indication: Impella 5.0  No Known Allergies  Patient Measurements: Height: '5\' 7"'  (170.2 cm)(measured x3) Weight: 158 lb 11.7 oz (72 kg) IBW/kg (Calculated) : 66.1  Vital Signs: Temp: 97.3 F (36.3 C) (11/04 1500) Temp Source: Core (11/04 1200) BP: 102/75 (11/04 1500) Pulse Rate: 91 (11/04 1200)  Labs: Recent Labs    05/28/19 0329 05/28/19 0341 05/28/19 0342  05/28/19 1620  06/07/2019 0211 05/28/2019 0255 06/11/2019 0416 06/14/2019 0424 06/24/2019 1109  HGB 8.7* 8.7*  --    < >  --    < > 5.9* 8.5* 11.4*  --   --   HCT 26.8* 26.3*  --    < >  --    < > 18.0* 25.0* 34.2*  --   --   PLT 93* 94*  --   --   --   --  54*  --   --   --   --   LABPROT  --   --   --   --   --   --  23.2*  --   --   --   --   INR  --   --   --   --   --   --  2.1*  --   --   --   --   HEPARINUNFRC  --   --  0.53  --   --   --   --   --   --  <0.10* 0.13*  CREATININE  --   --  1.36*  --  2.16*  --  3.13*  --  3.01*  --   --    < > = values in this interval not displayed.    Estimated Creatinine Clearance: 21.7 mL/min (A) (by C-G formula based on SCr of 3.01 mg/dL (H)).   Medical History: Past Medical History:  Diagnosis Date  . Acute respiratory failure with hypoxemia ([place]) 04/2019  . Carotid artery occlusion   . Hypertension     Medications:  Scheduled:  . sodium chloride   Intravenous Once  . sodium chloride   Intravenous Once  . arformoterol  15 mcg Nebulization BID  . aspirin  81 mg Per Tube Daily  . atorvastatin  80 mg Per Tube q1800  . chlorhexidine  15 mL Mouth Rinse BID  . chlorhexidine gluconate (MEDLINE KIT)  15 mL Mouth Rinse BID  . Chlorhexidine Gluconate Cloth  6 each Topical Daily  . epinephrine  0-10 mcg/min Intravenous To OR  . heparin-papaverine-plasmalyte irrigation   Irrigation To OR  . insulin aspart  1-3 Units Subcutaneous Q4H  . insulin   Intravenous To OR  . ipratropium  0.5 mg  Nebulization BID  . lidocaine-EPINEPHrine  20 mL Infiltration Once  . magnesium sulfate  40 mEq Other To OR  . mouth rinse  15 mL Mouth Rinse q12n4p  . mouth rinse  15 mL Mouth Rinse 10 times per day  . methylPREDNISolone (SOLU-MEDROL) injection  40 mg Intravenous Q12H  . metoCLOPramide (REGLAN) injection  5 mg Intravenous Q8H  . [START ON 06/01/2019] pantoprazole  40 mg Intravenous Q12H  . phenylephrine  30-200 mcg/min Intravenous To OR  . potassium chloride  80 mEq Other To OR  . senna-docusate  1 tablet Oral BID  . sodium chloride flush  10-40 mL Intracatheter Q12H  . sodium chloride flush  3 mL Intravenous Q12H  . tranexamic acid  15 mg/kg Intravenous To OR  . tranexamic acid  2 mg/kg Intracatheter To OR  . vancomycin 1000 mg in NS (1000 ml) irrigation for Dr. Roxy Manns case   Irrigation To OR   Infusions:  .  prismasol BGK 4/2.5 400 mL/hr at 06/02/2019 0703  .  prismasol BGK 4/2.5 200 mL/hr at 06/18/2019 7195  . sodium chloride 10 mL/hr at 05/20/19 0910  . sodium chloride Stopped (06/11/2019 0722)  . sodium chloride Stopped (05/28/19 0127)  . sodium chloride    . amiodarone Stopped (06/19/2019 0555)  . cefUROXime (ZINACEF)  IV    . cefUROXime (ZINACEF)  IV    . dexmedetomidine    . DOPamine Stopped (06/07/2019 0934)  . DOPamine    . epinephrine 5 mcg/min (06/02/2019 1500)  . fentaNYL infusion INTRAVENOUS 225 mcg/hr (05/28/2019 1500)  . heparin 30,000 units/NS 1000 mL solution for CELLSAVER    . impella catheter heparin 50 unit/mL in dextrose 5% 50,000 Units (05/23/19 0815)  . heparin 1,500 Units/hr (05/28/2019 1524)  . meropenem (MERREM) IV Stopped (06/14/2019 9747)  . midazolam 4 mg/hr (06/20/2019 1500)  . milrinone Stopped (06/14/2019 0153)  . milrinone    . nitroGLYCERIN    . norepinephrine (LEVOPHED) Adult infusion 28 mcg/min (06/21/2019 1500)  . pantoprozole (PROTONIX) infusion 8 mg/hr (06/20/2019 1500)  . prismasol BGK 4/2.5 1 mL (06/04/2019 1455)  . tranexamic acid (CYKLOKAPRON) infusion ([facility])     . vancomycin    . [START ON 05/30/2019] vancomycin      Assessment: 64 yoM admitted with shock and concern for ACS now s/p Impella CP placement in cath lab. Impella CP swapped out for 5.0 d/t hemolysis.   Overnight, patient had increasing respiratory distress requiring intubation follow by code blue with CPR. Systemic heparin infusion was stopped due to pulmonary hemorrhage.   ACT this morning with purge came back low at 125- systemic heparin was restarted at 1000 units/hr and increased to 1500 units/hr per protocol (ACT have ranged from 125 to 153). Purge solution running at 7.7 ml/hr, providing 385 units/hr of heparin. Impella 5 now running at P7. Hgb down to 8.5, now up to 11.4, plt 54 today.  Still dried blood in ET and OG tube- however, bleeding controlled.   Goal of Therapy:  ACT 160-180 Monitor platelets by anticoagulation protocol: Yes   Plan:  Continue heparin in purge solution and systemic heparin  Check ACT and titrate heparin rate per RN protocols Monitor daily heparin level, ACT, CBC Monitor for bleeding Plan for [facility] this afternoon  Antonietta Jewel, PharmD, BCCCP Clinical Pharmacist  Phone: [phone]  Please check AMION for all [place] phone numbers After 10:00 PM, call [place] [phone] 06/16/2019  3:38 PM

## 2019-05-29 NOTE — Progress Notes (Signed)
PT Cancellation Note  Patient Details Name: Raymond Chavez MRN: [id number] DOB: 1951-01-30   Cancelled Treatment:    Reason Eval/Treat Not Completed: Medical issues which prohibited therapy. Pt undergoing CODE this morning requiring re-intubation. Pt now sedated on vent and not currently able to participate in skilled PT at this time. PT will continue to follow per PT POC.  Zenaida Niece, PT, DPT Acute Rehabilitation Pager: [phone]    Zenaida Niece 06/04/2019, 10:35 AM

## 2019-05-29 NOTE — Progress Notes (Signed)
NAME:  Raymond Chavez, MRN:  [id number], DOB:  [birth year], LOS: 61 ADMISSION DATE:  05/24/2019, CONSULTATION DATE:  05/20/2019 REFERRING MD:  Billy Fischer  CHIEF COMPLAINT:  SOB   Brief History   Raymond Chavez is a 68 y.o. male who was admitted with acute hypoxic respiratory failure due to acute pulmonary edema in setting of NSTEMI, cardiogenic shock, possible PNA, He required intubation in the ED after failing BiPAP. On impella for hemodynamic support CVTS and cardiology are discussing about CABG, LVAD  Past Medical History  HTN, CAD.  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  10/27-heparin stopped due to low platelets.  HIT panel sent.  Starting bivalirudin.  Off Levophed.  On minimal vent support but failed weaning trials 10/28 Bronchoscopy  10/29 Started on CRRT 10/30 Bronchoscopy with mucous plug removal 11/1 Extubated  Consults:  Cardiology PCCM Nephrology   Procedures:  ETT 10/22 >11/1 Swan 10/22 > Impella 10/23 >> 10/24 Changed to 5.0 via right axillary >>  Vas cath 10/29  Aline 11/3  Significant Diagnostic Tests:  CTA chest 10/22 > no PE, b/l basilar consolidation, diffuse interlobular septal thickening with GGO's, b/l hilar adenopathy. Echo 11/2: LVEF 15%, impella in place 10/27 UE venous doppler: Right: No evidence of deep vein thrombosis in the upper extremity. However, unable to visualize the IJV, axillary. No evidence of superficial vein thrombosis in the upper extremity. However, unable to visualize the IJV, axillary. No evidence of thrombosis in the . However, unable to visualize the IJV, axillary.   Left: No evidence of thrombosis in the subclavian.     Micro Data:  Blood 10/22, 10/24, 10/28, 10/29 >>> NGTD Sputum 10/24 >>> Neg Urine 10/22 >>>Neg Urine Strep > Neg RVP 10/22  > Neg MRSA PCR > Neg SARS CoV2 10/22 > neg. BAL 10/28 > Enterococcus faecalis Blood 10/29: negative  Antimicrobials:  Vanc 10/22 >10/25, 10/28>10/31 Cefepime 10/22 >10/26, 10/29>11/1 Ampicillin 11/1>11/4 vanc 11/4-> Merrem11/4->  Interim history/subjective:  11/3: Remains extubated on 7LNC. Per RN mental status is improved. Remains on levo but is weaning. On p7 impella and increased dose of milrinone as well as amio. Wbc remains elevated around 30, diff pending.   11/4: code last night wit progressive shock and need for escalating pressors and intubation. Remains on impella device. Further transfusion occurred hgb now at 11. Pt is awake and following some commands but appears dyspneic and uncomfortable. Only on fentanyl. Unclear what was happening with arterial lines last pm but needless to say pt does not have one at this time. Cuff pressures adequate at this time and will be titrating pressors based on that until family meeting/goals of care planned this am.   Objective:  Blood pressure (!) 81/30, pulse 85, temperature 97.7 F (36.5 C), resp. rate (!) 26, height _0  (1.702 m), weight 72 kg, SpO2 100 %. PAP: (46-82)/(23-46) 58/26 CVP:  [5 mmHg-17 mmHg] 5 mmHg CO:  [4.9 L/min-5.9 L/min] 5.9 L/min CI:  [2.7 L/min/m2-3.2 L/min/m2] 3.2 L/min/m2  Vent Mode: PRVC FiO2 (%):  [100 %] 100 % Set Rate:  [20 bmp] 20 bmp Vt Set:  [520 mL] 520 mL PEEP:  [5 cmH20] 5 cmH20 Plateau Pressure:  [20 cmH20] 20 cmH20   Intake/Output Summary (Last 24 hours) at 06/19/2019 2130 Last data filed at 06/20/2019 0630 Gross  per 24 hour  Intake 2974.31 ml  Output -  Net 2974.31 ml   Filed Weights   05/26/19 0115 05/27/19 0329 05/28/19 0500  Weight: 71.8 kg 73 kg 72 kg   Physical Exam: General: acutely ill-appearing, uncomfortable clenching bed rails.  HENT: NCAT,  MMMP Eyes: EOMI, no scleral icterus Respiratory: diminished bilaterally with diffuse rhonchi Cardiovascular: Continuous hum GI: BS+,  soft, nontender Extremities:-Edema,-tenderness Neuro: Awake and alert, anxious and uncomfortable, follows commands, CNII-XII grossly intact  Assessment & Plan:   Acute hypoxic respiratory failure requiring intubation -multifactorial  Pulmonary hemorrhage Aspiration event ards Extubated 11/2 and reintubated 11/4 am Plan  -cont bronchodilators -titrate vent -broaden abx back out to vanc and merrem -cxr personally reviewed by me with diffuse infiltrates -repeat abg -holding heparin -still with ett bleeding -hold on resp cx with samples being just frank blood.   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  -Continue Impella at p7 today -ldh is up to 718 ->750 -echo pending for impella placement and hf after arrest.  Continue levophed for MAP goal >65 Continue preadmission ASA Hold preadmission amlodipine, atorvastatin, HCTZ, lisinopril   Co-comitant Septic Shock secondary to Enterococcus Faecalis PNA Aspiration event  Stable at 30 Plan Continue pressor support as above, weaning for map >65 -diff without bandemia -pct 0.45-> 1.26 (post arrest)  Acute Kidney Injury  Plan Nephrology following cvvh  Acute on chronic normocytic anemia:  Transfuse as needed Last hgb 11.4 Thrombocytopenia HIT panel negative Transfuse <50 with active bleeding  Hypophosphatemia Plan Replete  ?ileus:  -started bowel regimen yesterday  Goals of care:  Need ongoing discussion. Pt with poor prognosis in setting of recurrent hypoxemia req intubation and pulmonary hemorrhage. He is back up on pressors req cvvh all this making lvad less  likely and option and even cabg. Defer to HF team for discussion with family this am.   Best Practice:  Diet: npo Pain/Anxiety/Delirium protocol: versed prn, fentanyl VAP protocol: yes DVT prophylaxis: SCD's  GI prophylaxis: PPI. Glucose control: SSI. Mobility: bedrest Code Status: Full. Family Communication: per cards.  Disposition: ICU.  Labs   CBC: Recent Labs  Lab 05/27/19 0309  05/27/19 1651 05/28/19 0329 05/28/19 0341  05/28/19 1245 05/28/19 1920 06/16/2019 0050 06/10/2019 0211 06/18/2019 0416  WBC 30.0*  --  26.8* 29.2* 29.7*  --   --   --   --  30.1*  --   NEUTROABS  --   --  20.5* 22.8*  --   --   --   --   --  22.3*  --   HGB 7.2*   < > 7.7* 8.7* 8.7*   < > 7.4* 8.2* 7.1* 5.9* 11.4*  HCT 22.0*   < > 22.8* 26.8* 26.3*   < > 22.3* 23.5* 21.0* 18.0* 34.2*  MCV 89.8  --  89.1 90.8 89.5  --   --   --   --  91.4  --   PLT 120*  --  95* 93* 94*  --   --   --   --  54*  --    < > = values in this interval not displayed.  Basic Metabolic Panel: Recent Labs  Lab 05/25/19 0341  05/26/19 0250  05/27/19 0309  05/27/19 1414 05/28/19 0342 05/28/19 0403 05/28/19 1620 05/28/2019 0050 06/10/2019 0211 05/28/2019 0416  NA 136   < > 136   < > 135   < > 135 136 138 136 131* 139 141  K 4.9   < > 4.0   < > 4.4   < > 4.4 4.1 4.2 4.1 5.2* 4.8 4.5  CL 101   < > 104   < > 101  --  100 99  --  100  --  96* 100  CO2 23   < > 24   < > 21*  --  21* 24  --  23  --  23 23  GLUCOSE 193*   < > 119*   < > 124*  --  135* 134*  --  140*  --  138* 145*  BUN 33*   < > 33*   < > 36*  --  32* 30*  --  52*  --  74* 79*  CREATININE 1.75*   < > 1.45*   < > 1.39*  --  1.31* 1.36*  --  2.16*  --  3.13* 3.01*  CALCIUM 8.1*   < > 7.7*   < > 7.8*  --  7.7* 8.0*  --  7.6*  --  6.7* 6.9*  MG 2.5*  --  2.4  --  2.5*  --   --  2.6*  --   --   --  2.4  --   PHOS 3.6   < > 1.5*   < > 1.8*  --  3.8 2.1*  2.1*  --  4.5  --   --  7.1*   < > = values in this interval not displayed.   GFR: Estimated Creatinine  Clearance: 21.7 mL/min (A) (by C-G formula based on SCr of 3.01 mg/dL (H)). Recent Labs  Lab 05/23/19 1217 05/24/19 0420 05/25/19 0341  05/27/19 1651 05/28/19 0329 05/28/19 0341 05/28/19 0342 06/15/2019 0211  PROCALCITON 0.72 0.40 0.45  --   --   --   --  1.26  --   WBC  --  17.9* 19.0*   < > 26.8* 29.2* 29.7*  --  30.1*   < > = values in this interval not displayed.   Liver Function Tests: Recent Labs  Lab 05/27/19 1414 05/28/19 0342 05/28/19 1620 05/28/2019 0211 06/10/2019 0416  AST  --   --   --  100*  --   ALT  --   --   --  60*  --   ALKPHOS  --   --   --  50  --   BILITOT  --   --   --  1.5*  --   PROT  --   --   --  3.4*  --   ALBUMIN 2.2* 2.2* 1.9* 1.4* 1.9*   No results for input(s): LIPASE, AMYLASE in the last 168 hours. No results for input(s): AMMONIA in the last 168 hours. ABG    Component Value Date/Time   PHART 7.500 (H) 06/01/2019 0050   PCO2ART 18.9 (LL) 06/13/2019 0050   PO2ART 59.0 (L) 06/24/2019 0050   HCO3 14.7 (L) 05/31/2019 0050   TCO2 15 (L) 06/12/2019 0050   ACIDBASEDEF 7.0 (H) 06/16/2019 0050   O2SAT 79.1 06/22/2019 0430    Coagulation Profile: Recent Labs  Lab 06/12/2019 0211  INR 2.1*   Cardiac  Enzymes: No results for input(s): CKTOTAL, CKMB, CKMBINDEX, TROPONINI in the last 168 hours. HbA1C: Hgb A1c MFr Bld  Date/Time Value Ref Range Status  05/02/2019 11:25 PM 5.5 4.8 - 5.6 % Final    Comment:    (NOTE) Pre diabetes:          5.7%-6.4% Diabetes:              >6.4% Glycemic control for   <7.0% adults with diabetes    CBG: Recent Labs  Lab 05/28/19 1141 05/28/19 1613 05/28/19 2037 05/28/19 2324 06/22/2019 0256  GLUCAP 111* 140* 111* 126* 127*    Critical care time: The patient is critically ill with multiple organ systems failure and requires high complexity decision making for assessment and support, frequent evaluation and titration of therapies, application of advanced monitoring technologies and extensive interpretation  of multiple databases.  Critical care time 43 mins. This represents my time independent of the NPs time taking care of the pt. This is excluding procedures.    Audria Nine DO After hours pager: [phone]  [facility] 06/13/2019, 7:22 AM

## 2019-05-29 NOTE — Progress Notes (Signed)
CRITICAL VALUE ALERT  Critical Value:  Calcium 2.2  Date & Time Notied:  06/01/2019 2006  Provider Notified: [name]  Orders Received/Actions taken: No new orders at this time

## 2019-05-29 NOTE — Anesthesia Procedure Notes (Signed)
Arterial Line Insertion Start/End12-02-20 4:10 PM, 06/26/19 4:15 PM Performed by: Suzette Battiest, MD  Patient location: Pre-op. Preanesthetic checklist: patient identified, IV checked, site marked, risks and benefits discussed, surgical consent, monitors and equipment checked, pre-op evaluation, timeout performed and anesthesia consent Lidocaine 1% used for infiltration Left, brachial was placed Catheter size: 20 Fr Hand hygiene performed  and maximum sterile barriers used   Attempts: 1 Procedure performed using ultrasound guided technique. Ultrasound Notes:anatomy identified, needle tip was noted to be adjacent to the nerve/plexus identified, no ultrasound evidence of intravascular and/or intraneural injection and image(s) printed for medical record Following insertion, dressing applied, Biopatch and line sutured. Post procedure assessment: normal and unchanged  Patient tolerated the procedure well with no immediate complications.

## 2019-05-29 NOTE — Progress Notes (Signed)
Pt progressively became increasingly tachypnea, labored breathing, and use of accessory muscles through beginning of shift.  RT called to patients bedside at 2300 for possible breathing treatment.  Patient appeared tachypnea but stated he wasn't in distress, no intervention performed.  RT called back to bedside within 30 minutes due to increased work of breathing, audible crackles, rhonchi auscultated, and pt unable to clear airway.  CCM paged and ground team CCM arrived.  Pt then required max support from levophed drip, dose changed from 16-40 rapidly.  More pressure support required for intubation.  Patient PEA arrest.  Started on Dopamine, epi drip.  Amiodarone, milrinone, and heparin drip stopped.  Hemoglobin 5.9, 3 units of RBCs given emergently.  Repeat hemoglobin was 11.4.  Pt restless on vent, fentanyl  Drip given for comfort.  Pt restarted back on CRRT.

## 2019-05-29 NOTE — Progress Notes (Signed)
On arrival to patient room, patient is struggling to breathe in the setting of patient presenting with air hunger, labored respirations, and accessory muscle usage. Gurgling respirations were noted at the door. Encouraged patient to cough. Patient was unable to provide or give me an effective cough. BBS to auscultation reveals Coarse Crackles and apical Rhonchi noted. Patient was NTS due to ineffective cough. Got back moderate amount of brownish/old blood looking secretions. Once got secretions out, no more gurgling respirations noted. Once NTS no gag reflex noted. Patient is tachypnic w/ rate in the 40's. PRN neb given w/ no clinical change. Saturations are borderline acceptable at 90%. Patient looks like he is impending ventilatory failure. MD Oletta Darter notified of patient clinical presentation and overall respiratory status. RRT currently at bedside.

## 2019-05-29 NOTE — Progress Notes (Addendum)
PCCM PROGRESS NOTE   Post code pt evaluated Awake alert  Using non verbal responses moving upper extremities Tachypnea on vent. Started on fentanyl to assist with sedation. SBP >80s Currently on Epi gtt @ 20 Levophed gtt @ 40 and Dopamine @ 20 S/p 2 u PRBCs given after ABG during code showed Hgb 5.4 on Istat. Blood recently completed will repeat Hgb.  Discussed case with HF Attending Dr Aundra Dubin Heparin gtt is now switched to run through Impella Position of Impella confirmed by ECHO by HF Attending. Pt has not yet made urine. CVVH to be restarted  Multiple attempts made to reach family, unsuccessful.  Signed Dr Seward [facility] Loc[facility]

## 2019-05-29 NOTE — Progress Notes (Addendum)
Patient ID: Raymond Chavez, male   DOB: Jan 19, 1951, 68 y.o.   MRN: [id number]    Advanced Heart Failure Rounding Note   Subjective:    Underwent placement of Impella 5.0 on 10/24 with removal of R femoral Impella CP.   Extubated 11/1, CVVH held on 11/3 given CVP 6-7 to see if renal function would recover.  He had a possible aspiration event afternoon 11/3 then became progressively hypoxemic during the night.  He had to be re-intubated.  Bronchoscopy showed copious blood return suggesting hemoptysis.  He was suctioned and given epinephrine down the tube and bleeding appeared to stop.  He developed PEA arrest during this time and underwent CPR with boluses of epinephrine and HCO3, total CPR time was 10-15 minutes. There was no shockable rhythm. Heparin gtt was stopped and heparin was transitioned to the purge fluid of the Impella.  Hgb was 5.7, patient received a total of 3 units PRBCs, hgb 11.4 this morning.  CVVH was restarted, currently running even. He still has blood in ET tube this morning.   Enterococcus faecalis on BAL culture.  Afebrile, PCT 1.26, patient is on ampicillin.   Plts dropped to 54 after arrest yesterday. HIT negative.    He is now on dopamine 20, epinephrine 19, NE 40 post-arrest.  Amiodarone stopped, in NSR.   Swan #s  PA 58/26 CVP 11 CI 2.5 Co-ox 79%  He is awake on vent, following commands.    Impella P-7 No flows due to damage of sensor during placement. Motor current stable. LDH 881 => 911 => 593 => 589 => 718 => 744.   RHC/LHC (10/23):  Coronary Findings  Diagnostic Dominance: Right Left Main  75% distal left main stenosis.  Left Anterior Descending  Occluded proximally, some late filling by collaterals.  Ramus Intermedius  Large vessel, patent with luminal irregularities.  Left Circumflex  80% mid LCx stenosis. Small OM1 with 95% proximal stenosis. Large OM2 is subtotally occluded (fills late with TIMI 2 flow).  Right Coronary Artery  80% proximal  stenosis. Serial 50% mid-vessel stenoses. PDA with only luminal irregularities. Large PLV with 80% mid-vessel stenosis.  Intervention  No interventions have been documented. Right Heart  Right Heart Pressures RHC Procedural Findings (mmHg): Hemodynamics RA mean 13 RV 70/17 PA 73/41, mean 53 PCWP mean 30 LV 140/40 AO 138/88  Oxygen saturations: PA 60% AO 97%  Cardiac Output (Fick) 2.95  Cardiac Index (Fick) 1.62 PVR 7.8 WU  Cardiac Output (Thermo) 3.24 Cardiac Index (Thermo) 1.78  PVR 7.1 WU  PAPI 2.46    Objective:   Weight Range:  Vital Signs:   Temp:  [97.2 F (36.2 C)-99.3 F (37.4 C)] 97.2 F (36.2 C) (11/04 0715) Pulse Rate:  [82-95] 95 (11/04 0718) Resp:  [20-41] 36 (11/04 0718) BP: (54-128)/(29-106) 107/54 (11/04 0718) SpO2:  [92 %-100 %] 100 % (11/04 0735) Arterial Line BP: (31-116)/(27-70) 31/27 (11/04 0200) FiO2 (%):  [80 %-100 %] 80 % (11/04 0735) Last BM Date: (PTA)  Weight change: Filed Weights   05/26/19 0115 05/27/19 0329 05/28/19 0500  Weight: 71.8 kg 73 kg 72 kg    Intake/Output:   Intake/Output Summary (Last 24 hours) at 05/26/2019 0751 Last data filed at 06/07/2019 0700 Gross per 24 hour  Intake 3785.11 ml  Output -  Net 3785.11 ml     Physical Exam: CVP 11 General: Intubated, awake.  Neck: JVP difficult, no thyromegaly or thyroid nodule.  Lungs: Decreased at bases.  CV: Nondisplaced PMI.  Heart  regular S1/S2, no S3/S4, Impella sounds.  Trace ankle edema.   Abdomen: Soft, nontender, no hepatosplenomegaly, no distention.  Skin: Intact without lesions or rashes.  Neurologic: Alert and follows commands.   Extremities: No clubbing or cyanosis.  HEENT: Normal.     Telemetry: NSR 90s (personally reviewed)  Labs: Basic Metabolic Panel: Recent Labs  Lab 05/25/19 0341  05/26/19 0250  05/27/19 0309  05/27/19 1414 05/28/19 0342 05/28/19 0403 05/28/19 1620 05/26/2019 0050 05/27/2019 0211 06/16/2019 0416  NA 136   < > 136   < >  135   < > 135 136 138 136 131* 139 141  K 4.9   < > 4.0   < > 4.4   < > 4.4 4.1 4.2 4.1 5.2* 4.8 4.5  CL 101   < > 104   < > 101  --  100 99  --  100  --  96* 100  CO2 23   < > 24   < > 21*  --  21* 24  --  23  --  23 23  GLUCOSE 193*   < > 119*   < > 124*  --  135* 134*  --  140*  --  138* 145*  BUN 33*   < > 33*   < > 36*  --  32* 30*  --  52*  --  74* 79*  CREATININE 1.75*   < > 1.45*   < > 1.39*  --  1.31* 1.36*  --  2.16*  --  3.13* 3.01*  CALCIUM 8.1*   < > 7.7*   < > 7.8*  --  7.7* 8.0*  --  7.6*  --  6.7* 6.9*  MG 2.5*  --  2.4  --  2.5*  --   --  2.6*  --   --   --  2.4  --   PHOS 3.6   < > 1.5*   < > 1.8*  --  3.8 2.1*  2.1*  --  4.5  --   --  7.1*   < > = values in this interval not displayed.    Liver Function Tests: Recent Labs  Lab 05/27/19 1414 05/28/19 0342 05/28/19 1620 06/02/2019 0211 06/10/2019 0416  AST  --   --   --  100*  --   ALT  --   --   --  60*  --   ALKPHOS  --   --   --  50  --   BILITOT  --   --   --  1.5*  --   PROT  --   --   --  3.4*  --   ALBUMIN 2.2* 2.2* 1.9* 1.4* 1.9*   No results for input(s): LIPASE, AMYLASE in the last 168 hours. No results for input(s): AMMONIA in the last 168 hours.  CBC: Recent Labs  Lab 05/27/19 0309  05/27/19 1651 05/28/19 0329 05/28/19 0341  05/28/19 1245 05/28/19 1920 05/28/2019 0050 06/16/2019 0211 06/07/2019 0416  WBC 30.0*  --  26.8* 29.2* 29.7*  --   --   --   --  30.1*  --   NEUTROABS  --   --  20.5* 22.8*  --   --   --   --   --  22.3*  --   HGB 7.2*   < > 7.7* 8.7* 8.7*   < > 7.4* 8.2* 7.1* 5.9* 11.4*  HCT 22.0*   < > 22.8* 26.8*  26.3*   < > 22.3* 23.5* 21.0* 18.0* 34.2*  MCV 89.8  --  89.1 90.8 89.5  --   --   --   --  91.4  --   PLT 120*  --  95* 93* 94*  --   --   --   --  54*  --    < > = values in this interval not displayed.    Cardiac Enzymes: No results for input(s): CKTOTAL, CKMB, CKMBINDEX, TROPONINI in the last 168 hours.  BNP: BNP (last 3 results) Recent Labs    05/10/2019 1739 05/06/2019  1234  BNP 2,725.3* 3,370.0*    ProBNP (last 3 results) No results for input(s): PROBNP in the last 8760 hours.    Other results:  Imaging: Dg Abd 1 View  Result Date: 05/28/2019 CLINICAL DATA:  OG tube placement EXAM: ABDOMEN - 1 VIEW COMPARISON:  May 28, 2019 FINDINGS: The tip of the enteric tube appears to terminate near the GE junction. Exact positioning is difficult to determine secondary to overlapping wires. The stomach is distended. The bowel gas pattern is otherwise nonspecific. An Impella is noted projecting over the left ventricle. IMPRESSION: 1. Enteric tube tip appears to terminate near the GE junction. Repositioning should be considered. 2. Gaseous distention of the stomach. Electronically Signed   By: Constance Holster M.D.   On: 06/23/2019 06:17   Dg Chest Port 1 View  Result Date: 05/28/2019 CLINICAL DATA:  ET tube placement.  Post CPR. EXAM: PORTABLE CHEST 1 VIEW COMPARISON:  05/28/2019 FINDINGS: Endotracheal tube is 3 cm above the carina. Left dialysis catheter remains in place, unchanged. Impella device remains within the heart, unchanged. Swan-Ganz catheter is in the central right pulmonary artery, also stable. Heart is normal size. Bilateral upper lobe airspace opacities again noted, increasing slightly since prior study. No effusions. No acute bony abnormality. IMPRESSION: Bilateral upper lobe airspace opacities have worsened slightly since prior study. Endotracheal tube 3 cm above the carina. Electronically Signed   By: Rolm Baptise M.D.   On: 06/07/2019 02:15   Dg Chest Port 1 View  Result Date: 05/28/2019 CLINICAL DATA:  Shortness of breath.  CHF.  Impella device. EXAM: PORTABLE CHEST 1 VIEW COMPARISON:  05/27/2019. FINDINGS: Impella device, Swan-Ganz catheter, left IJ line stable position, right PICC line. Stable cardiomegaly. Diffuse bilateral pulmonary infiltrates/edema again noted. Similar findings noted on prior exam. No pleural effusion or pneumothorax.  IMPRESSION: 1.  Lines in stable position.  Impella device in stable position. 2. Stable cardiomegaly. Diffuse bilateral pulmonary infiltrates/edema unchanged. Electronically Signed   By: Marcello Moores  Register   On: 05/28/2019 08:15   Dg Abd Portable 1v  Result Date: 05/28/2019 CLINICAL DATA:  Multiple instances of vomiting. EXAM: PORTABLE ABDOMEN - 1 VIEW COMPARISON:  Chest x-ray 05/28/2019. FINDINGS: Slightly prominent air-filled loops of small and large bowel noted. Adynamic ileus could present in this fashion. Follow-up exam suggested demonstrate resolution and to exclude bowel obstruction. No free air. Degenerative changes lumbar spine and both hips. Aortoiliac atherosclerotic vascular disease. IMPRESSION: Slightly prominent air-filled loops of small large bowel noted. Adynamic ileus could present in this fashion. Follow-up exam suggested to demonstrate resolution and to exclude bowel obstruction. Electronically Signed   By: Marcello Moores  Register   On: 05/28/2019 14:08     Medications:     Scheduled Medications: . sodium chloride   Intravenous Once  . sodium chloride   Intravenous Once  . arformoterol  15 mcg Nebulization BID  . aspirin  81 mg Per Tube Daily  . atorvastatin  80 mg Per Tube q1800  . chlorhexidine  15 mL Mouth Rinse BID  . chlorhexidine gluconate (MEDLINE KIT)  15 mL Mouth Rinse BID  . Chlorhexidine Gluconate Cloth  6 each Topical Daily  . feeding supplement (ENSURE ENLIVE)  237 mL Oral TID BM  . feeding supplement (PRO-STAT SUGAR FREE 64)  30 mL Oral BID  . insulin aspart  1-3 Units Subcutaneous Q4H  . ipratropium  0.5 mg Nebulization BID  . lidocaine-EPINEPHrine  20 mL Infiltration Once  . mouth rinse  15 mL Mouth Rinse q12n4p  . mouth rinse  15 mL Mouth Rinse 10 times per day  . metoCLOPramide (REGLAN) injection  5 mg Intravenous Q8H  . midazolam  4 mg Intravenous Once  . multivitamin with minerals  1 tablet Oral Daily  . pantoprazole (PROTONIX) IV  40 mg Intravenous BID  .  [START ON 06/01/2019] pantoprazole  40 mg Intravenous Q12H  . senna-docusate  1 tablet Oral BID  . sodium chloride flush  10-40 mL Intracatheter Q12H  . sodium chloride flush  3 mL Intravenous Q12H    Infusions: .  prismasol BGK 4/2.5 400 mL/hr at 06/12/2019 0703  .  prismasol BGK 4/2.5 200 mL/hr at 06/10/2019 5885  . sodium chloride 10 mL/hr at 05/20/19 0910  . sodium chloride Stopped (06/14/2019 0101)  . sodium chloride Stopped (05/28/19 0127)  . sodium chloride    . amiodarone Stopped (05/28/2019 0130)  . ampicillin (OMNIPEN) IV 2 g (06/21/2019 0722)  . DOBUTamine    . DOBUTamine    . DOPamine 20 mcg/kg/min (06/12/2019 0620)  . epinephrine 20 mcg/min (06/15/2019 0620)  . fentaNYL infusion INTRAVENOUS 200 mcg/hr (06/02/2019 0559)  . fentaNYL infusion INTRAVENOUS    . impella catheter heparin 50 unit/mL in dextrose 5% 50,000 Units (05/23/19 0815)  . heparin Stopped (06/19/2019 0222)  . milrinone Stopped (05/26/2019 0153)  . norepinephrine (LEVOPHED) Adult infusion 40 mcg/min (06/02/2019 0600)  . pantoprozole (PROTONIX) infusion 8 mg/hr (06/12/2019 0645)  . prismasol BGK 4/2.5 2,000 mL/hr at 06/17/2019 0706    PRN Medications: sodium chloride, Place/Maintain arterial line **AND** sodium chloride, sodium chloride, docusate, fentaNYL, heparin, influenza vaccine adjuvanted, ipratropium-albuterol, midazolam, midazolam, pneumococcal 23 valent vaccine, sodium chloride, sodium chloride flush, sodium chloride flush   Assessment/Plan:   1. Shock: Possible mixed cardiogenic/septic with fever and suspected PNA.  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  Echo with EF 20%, RV moderately HK.  Impella CP placed 10/23. Switched for Impella 5.0 on 10/24. No flow meter on Impella due to damage to sensor on insertion. Patient has LBBB.  PEA arrest 11/3, now on epinephrine 19, dopamine 20, NE 40. CI 2.5, co-ox 79%, CVP 11.  - Wean down on dopamine this morning, BP stable.  - CVVH restarted, with  diffuse lung infiltrates and CVP 11, start net UF 50 cc/hr.  - Continue Impella, motor current stable this morning (sensor nonfunctional). LDH slightly higher at 744 but had PEA arrest with CPR last night.  Impella position looked relatively stable on echo with hand-held device yesterday, will recheck today.   2. CAD:  NSTEMI, hs-TnI 16,000. 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  - continue ASA/statin. No b-blocker with shock - CABG with Impella 5.0 support likely best option if able.  3. AKI: Baseline creatinine 1.3, now AKI likely due to ATN from  shock and contrast (had CTA for PE at admission, only 25 cc contrast with cath). CVVH held yesterday, minimal UOP.  Now back on CVVH.  - Continue CVVH as above, net UF 50 cc/hr needed.  4. Acute Respiratory Failure: Has had PNA with Enterococcus faecalis in BAL cultures. Now re-intubated.  He had PEA arrest with possible aspiration and also had pulmonary hemorrhage.  Heparin was stopped systemically and patient had 3 units PRBCs.  Still with blood noted in ET tube. CXR with bilateral upper lobe airspace opacities.  - CVVH restarted as above.  - Stop ampicillin and start vancomycin/meropenem.  5. ID: Suspected PNA, CXR with left base density. Enterococcus faecalis in BAL cultures.  Now afebrile, but WBCs up to 30K.  - As above, with possible aspiration/pulmonary hemorrhage/PEA arrest, started vancomycin/meropenem.    6. LBBB: Unsure chronicity.  7. Anemia: 3 units PRBCs 11/3 in setting of pulmonary hemorrhage, appropriate rise in hgb.  8. Thrombocytopenia: Platelets dropped to 55K last night in setting of code blue.  HIT negative.  Got 1 unit plts 10/29 pre-line placement.  ?Low due to sepsis versus hemolysis.    9. PVCs/NSVT: Quiescent, amiodarone was stopped.  10. FEN: Concern for ileus yesterday, not currently on tube feeds.   CRITICAL CARE Performed by: Loralie Champagne  Total critical care time: 40 minutes   Critical care time was exclusive of separately billable procedures and treating other patients.  Critical care was necessary to treat or prevent imminent or life-threatening deterioration.  Critical care was time spent personally by me on the following activities: development of treatment plan with patient and/or surrogate as well as nursing, discussions with consultants, evaluation of patient's response to treatment, examination of patient, obtaining history from patient or surrogate, ordering and performing treatments and interventions, ordering and review of laboratory studies, ordering and review of radiographic studies, pulse oximetry and re-evaluation of patient's condition.  Loralie Champagne 06/09/2019 7:51 AM  Impella adjusted under echo guidance, it was in deep and was withdrawn to about 3.1 cm.   Loralie Champagne 05/27/2019

## 2019-05-29 NOTE — H&P (Signed)
History and Physical Interval Note:  06/20/2019 2:59 PM  Raymond Chavez  has presented today for surgery, with the diagnosis of ventilator dependence and ischemic heart failure.  The various methods of treatment have been discussed with the patient and family. After consideration of risks, benefits and other options for treatment, the patient has consented to  Procedure(s) with comments: CANNULATION FOR ECMO (EXTRACORPOREAL MEMBRANE OXYGENATION) (N/A) - PARTIAL STERNOTOMY TRANSESOPHAGEAL ECHOCARDIOGRAM (TEE) (N/A) as a surgical intervention.  The patient's history has been reviewed, patient examined, no change in status, stable for surgery.  I have reviewed the patient's chart and labs.  Questions were answered to the patient's family's satisfaction.    He is well-known to me after Impella 5.0 LVAD insertion approximately 2 weeks ago. He had been in and out of shock with respiratory failure, renal failure requiring dialysis, anemia, and need for vasoactive medications. Adding V-A ECMO will be the most likely way to support the heart and rest the lungs with the end-goal of CABG for revascularization. This is admittedly a long-shot, but he remains neurologically intact and we have direct indication from the patient and family that he would want additional measures to try to treat his cardiopulmonary failure.    Wonda Olds

## 2019-05-29 NOTE — Procedures (Signed)
Arterial Catheter Insertion Procedure Note Raymond Chavez [id number] [birth year]  Procedure: Insertion of Arterial Catheter  Indications: Blood pressure monitoring  Attempted x 2 post attempt by NP  Procedure Details Consent: Unable to obtain consent because of unable to reach family and procedure needed for continued care. Time Out: Verified patient identification, verified procedure, site/side was marked, verified correct patient position, special equipment/implants available, medications/allergies/relevent history reviewed, required imaging and test results available.  Performed  Maximum sterile technique was used including antiseptics, cap, gloves, gown, hand hygiene, mask and sheet. Skin prep: Chlorhexidine; local anesthetic administered 20 gauge catheter was inserted into left femoral artery using the Seldinger technique. ULTRASOUND GUIDANCE USED: YES Evaluation Blood flow good; unable to thread guidewire BP tracing unable to complete procedure due to difficulty threading guidewire. Complications: No apparent complications.   Raymond Chavez 05/28/2019

## 2019-05-29 NOTE — Progress Notes (Signed)
Wasted 30 mg of ketamine in pyxis with Jenna Bullins.

## 2019-05-29 NOTE — Progress Notes (Addendum)
Pharmacy CONSULT NOTE  Pharmacy Consult for heparin & antibiotics Indication for heparin: Impella 5.0 & ECMO  No Known Allergies  Patient Measurements: Height: 5\' 7"  (170.2 cm)(measured x3) Weight: 158 lb 11.7 oz (72 kg) IBW/kg (Calculated) : 66.1  Vital Signs: Temp: 97.7 F (36.5 C) (11/04 1948) Temp Source: Core (11/04 1948) BP: 103/62 (11/04 1948) Pulse Rate: 80 (11/04 1948)  Labs: Recent Labs    05/28/19 0341 05/28/19 0342  06/22/2019 0211  06/17/2019 0416 06/14/2019 0424 06/02/2019 1109 06/21/2019 1647 06/07/2019 1649 06/07/2019 1653 06/12/2019 1757 05/27/2019 1833  HGB 8.7*  --    < > 5.9*   < > 11.4*  --   --  9.9* 9.2* 8.8* 7.5* 7.8*  HCT 26.3*  --    < > 18.0*   < > 34.2*  --   --  29.0* 27.0* 26.0* 22.0* 23.0*  PLT 94*  --   --  54*  --   --   --   --  47*  --   --   --   --   APTT  --   --   --   --   --   --   --   --  42*  --   --   --   --   LABPROT  --   --   --  23.2*  --   --   --   --  18.8*  --   --   --   --   INR  --   --   --  2.1*  --   --   --   --  1.6*  --   --   --   --   HEPARINUNFRC  --  0.53  --   --   --   --  <0.10* 0.13*  --   --   --   --   --   CREATININE  --  1.36*   < > 3.13*  --  3.01*  --   --   --  2.40*  --   --   --    < > = values in this interval not displayed.    Estimated Creatinine Clearance: 27.2 mL/min (A) (by C-G formula based on SCr of 2.4 mg/dL (H)).  Assessment: 71 yoM admitted with shock and concern for ACS now s/p Impella CP placement in cath lab. Impella CP swapped out for 5.0 d/t hemolysis.   Overnight, patient had increasing respiratory distress requiring intubation follow by code blue with CPR. Systemic heparin infusion was stopped due to pulmonary hemorrhage. Heparin was resumed in afternoon  Decided to initiate VA ECMO with goal of CABG for revascularization.   ECMO initiate about 1800; continued on IV Heparin 1500 units/hr  Patient also on antibiotics for concern of aspiration PNA. Vanc and Meropenem were initiated  this AM  CRRT to resume this evening after ECMO initiation   Vanc 1250 mg given this and 1250 mg x 1 prior to cannulation (will consider this adequate load)  Goal of Therapy:  Hep lvl 0.3 - 0.7 Monitor platelets by anticoagulation protocol: Yes   Plan:  Continue heparin 1500 units/hr - to be dosed by pharmacy protocol per Dr 78 Initial hep lvl 0000 and q6 h thereafter until therapeutic x 2 Also ordered q12h CBC, INR, Fibrinogen, CMP, Mg, and Lactate per protocol  Continue current vanc 750 mg q24h - goal trough 15 - 20 mcg/mL Adjust meropenem to  1 g q8h while on CRRT & ECMO Post op cefuroxime and vanc orders dc since already on above Monitor cx lvls and LOT  Barth Kirks, PharmD, BCPS, BCCCP Clinical Pharmacist [phone]  Please check AMION for all [place] numbers  06/16/2019 8:02 PM   Addendum:  Patient having > 200 mL/hr CT output  Holding heparin per Dr [name]  Also requests aPTT goal of 55 - 70 s  Pharmacy will continue to monitor current aPTT which is in process and q4-6 hour after until ok to resume heparin at lower dose

## 2019-05-29 NOTE — Progress Notes (Signed)
Pharmacy Antibiotic Note  Raymond Chavez is a 68 y.o. male admitted on 05/25/2019 with pneumonia.  Pharmacy has been consulted for meropenem and vancomycin dosing.  Has received 8 days of antibiotics for recent BAL showing 40k enterococcus. Pt overnight having increasing respiratory distress requiring intubation followed by code blue with CPR >> concern for aspiration. Bronchoscopy showing copious blood return suggesting hemoptysis. WBC up to 30, LA 1.26, temp 97.2. Back on CRRT today - goal UF -50.  Plan: Vancomycin 1250 mg IV once then 750 mg IV every 24 hours  Meropenem 1g IV every 12 hours  Monitor CRRT, cx results, clinical pic, and vanc levels as appropriate  Height: _0  (170.2 cm)(measured x3) Weight: 158 lb 11.7 oz (72 kg) IBW/kg (Calculated) : 66.1  Temp (24hrs), Avg:98.6 F (37 C), Min:97.2 F (36.2 C), Max:99.3 F (37.4 C)  Recent Labs  Lab 05/27/19 0309 05/27/19 1414 05/27/19 1651 05/28/19 0329 05/28/19 0341 05/28/19 0342 05/28/19 1620 06/19/2019 0211 05/27/2019 0416  WBC 30.0*  --  26.8* 29.2* 29.7*  --   --  30.1*  --   CREATININE 1.39* 1.31*  --   --   --  1.36* 2.16* 3.13* 3.01*    Estimated Creatinine Clearance: 21.7 mL/min (A) (by C-G formula based on SCr of 3.01 mg/dL (H)).    No Known Allergies  Antimicrobials this admission: Cefepime 10/22>>10/26, 10/29>>11/1 Vancomycin 10/22>>10/25, 10/28>>11/1, 11/4>> Ampicillin 11/1>>11/4 Meropenem 11/4>>  Dose adjustments this admission: N/A  Microbiology results: 10/29 BCx - ngtd 10/28 BAL - 40k e faecalis - S to vanc/amp 10/24 TA - normal flora 10/22BCx: ngF 10/22UCx:ng 10/22 Respiratory PCR: negative 10/22 COVID: negative  Thank you for allowing pharmacy to be a part of this patient's care.  Antonietta Jewel, PharmD, BCCCP Clinical Pharmacist  Phone: [phone]  Please check AMION for all [facility] phone numbers After 10:00 PM, call [place] [phone] 05/31/2019 7:58 AM

## 2019-05-29 NOTE — Progress Notes (Signed)
[facility] NEPHROLOGY PROGRESS NOTE  Assessment/ Plan: Pt is a 68 y.o. yo male with cardiogenic shock who underwent placement of Impella on 10/24, on milrinone and Levophed, cardiac cath with three-vessel disease and echocardiogram revealing EF of 20%, respiratory failure, consulted for AKI.  Baseline creatinine level around 1.3.  #Acute kidney injury on CKD: Likely due to cardiorenal syndrome and contrast nephropathy. CRRT since 10/30, tolerating well.  Patient had cardiac arrest last night requiring multiple pressors, CVP around 9-10.  Resumed CRRT last night.  Attempt UF goal 50-100 cc if tolerated by BP, discussed with nurse.  All 4K bath and on IV heparin.  Strict ins and out.    #Cardiac arrest/cardiogenic shock: EF 20%, has Impella.  Had  cardiac arrest on 11/3.  Currently on dopamine, Levophed, epinephrine with borderline blood pressure.  Heart failure team is following.    #Acute respiratory failure with hypoxia: Due to CHF and pneumonia.  Pulmonary team is following.  On mechanical ventilation  #CAD: NSTEMI, CABG with severe three-vessel disease.  On aspirin statin.  No beta-blocker because of shock.  #Hypophosphatemia: Elevated phosphorus level noted today, expect to improve with CRRT.  Discussed with the heart failure team.  Subjective: Seen and examined at bedside.  Overnight event noted.  Patient is critically ill requiring multiple pressors, on mechanical ventilation.  Resumed CRRT and has been tolerating well so far.  Review of system is limited.  Objective Vital signs in last 24 hours: Vitals:   05/30/2019 0715 05/27/2019 0718 05/30/2019 0734 06/23/2019 0735  BP:  (!) 107/54    Pulse:  95    Resp: (!) 36 (!) 36    Temp: (!) 97.2 F (36.2 C)     TempSrc:      SpO2: 100% 100% 100% 100%  Weight:      Height:       Weight change:   Intake/Output Summary (Last 24 hours) at 06/06/2019 0837 Last data filed at 06/19/2019 0800 Gross per 24 hour  Intake 4034.48 ml   Output -7 ml  Net 4041.48 ml       Labs: Basic Metabolic Panel: Recent Labs  Lab 05/28/19 0342  05/28/19 1620 06/18/2019 0050 06/17/2019 0211 06/18/2019 0416  NA 136   < > 136 131* 139 141  K 4.1   < > 4.1 5.2* 4.8 4.5  CL 99  --  100  --  96* 100  CO2 24  --  23  --  23 23  GLUCOSE 134*  --  140*  --  138* 145*  BUN 30*  --  52*  --  74* 79*  CREATININE 1.36*  --  2.16*  --  3.13* 3.01*  CALCIUM 8.0*  --  7.6*  --  6.7* 6.9*  PHOS 2.1*  2.1*  --  4.5  --   --  7.1*   < > = values in this interval not displayed.   Liver Function Tests: Recent Labs  Lab 05/28/19 1620 05/28/2019 0211 06/19/2019 0416  AST  --  100*  --   ALT  --  60*  --   ALKPHOS  --  50  --   BILITOT  --  1.5*  --   PROT  --  3.4*  --   ALBUMIN 1.9* 1.4* 1.9*   No results for input(s): LIPASE, AMYLASE in the last 168 hours. No results for input(s): AMMONIA in the last 168 hours. CBC: Recent Labs  Lab 05/27/19 0309  05/27/19 1651   05/28/19 0329 05/28/19 0341  06/22/2019 0050 06/14/2019 0211 06/12/2019 0416  WBC 30.0*  --  26.8* 29.2* 29.7*  --   --  30.1*  --   NEUTROABS  --   --  20.5* 22.8*  --   --   --  22.3*  --   HGB 7.2*   < > 7.7* 8.7* 8.7*   < > 7.1* 5.9* 11.4*  HCT 22.0*   < > 22.8* 26.8* 26.3*   < > 21.0* 18.0* 34.2*  MCV 89.8  --  89.1 90.8 89.5  --   --  91.4  --   PLT 120*  --  95* 93* 94*  --   --  54*  --    < > = values in this interval not displayed.   Cardiac Enzymes: No results for input(s): CKTOTAL, CKMB, CKMBINDEX, TROPONINI in the last 168 hours. CBG: Recent Labs  Lab 05/28/19 1141 05/28/19 1613 05/28/19 2037 05/28/19 2324 06/02/2019 0256  GLUCAP 111* 140* 111* 126* 127*    Iron Studies: No results for input(s): IRON, TIBC, TRANSFERRIN, FERRITIN in the last 72 hours. Studies/Results: Dg Abd 1 View  Result Date: 06/10/2019 CLINICAL DATA:  OG tube placement EXAM: ABDOMEN - 1 VIEW COMPARISON:  May 28, 2019 FINDINGS: The tip of the enteric tube appears to terminate  near the GE junction. Exact positioning is difficult to determine secondary to overlapping wires. The stomach is distended. The bowel gas pattern is otherwise nonspecific. An Impella is noted projecting over the left ventricle. IMPRESSION: 1. Enteric tube tip appears to terminate near the GE junction. Repositioning should be considered. 2. Gaseous distention of the stomach. Electronically Signed   By: Christopher  Green M.D.   On: 06/14/2019 06:17   Dg Chest Port 1 View  Result Date: 06/11/2019 CLINICAL DATA:  ET tube placement.  Post CPR. EXAM: PORTABLE CHEST 1 VIEW COMPARISON:  05/28/2019 FINDINGS: Endotracheal tube is 3 cm above the carina. Left dialysis catheter remains in place, unchanged. Impella device remains within the heart, unchanged. Swan-Ganz catheter is in the central right pulmonary artery, also stable. Heart is normal size. Bilateral upper lobe airspace opacities again noted, increasing slightly since prior study. No effusions. No acute bony abnormality. IMPRESSION: Bilateral upper lobe airspace opacities have worsened slightly since prior study. Endotracheal tube 3 cm above the carina. Electronically Signed   By: Kevin  Dover M.D.   On: 06/16/2019 02:15   Dg Chest Port 1 View  Result Date: 05/28/2019 CLINICAL DATA:  Shortness of breath.  CHF.  Impella device. EXAM: PORTABLE CHEST 1 VIEW COMPARISON:  05/27/2019. FINDINGS: Impella device, Swan-Ganz catheter, left IJ line stable position, right PICC line. Stable cardiomegaly. Diffuse bilateral pulmonary infiltrates/edema again noted. Similar findings noted on prior exam. No pleural effusion or pneumothorax. IMPRESSION: 1.  Lines in stable position.  Impella device in stable position. 2. Stable cardiomegaly. Diffuse bilateral pulmonary infiltrates/edema unchanged. Electronically Signed   By: Thomas  Register   On: 05/28/2019 08:15   Dg Abd Portable 1v  Result Date: 05/28/2019 CLINICAL DATA:  Multiple instances of vomiting. EXAM: PORTABLE  ABDOMEN - 1 VIEW COMPARISON:  Chest x-ray 05/28/2019. FINDINGS: Slightly prominent air-filled loops of small and large bowel noted. Adynamic ileus could present in this fashion. Follow-up exam suggested demonstrate resolution and to exclude bowel obstruction. No free air. Degenerative changes lumbar spine and both hips. Aortoiliac atherosclerotic vascular disease. IMPRESSION: Slightly prominent air-filled loops of small large bowel noted. Adynamic ileus could present in this fashion.   Follow-up exam suggested to demonstrate resolution and to exclude bowel obstruction. Electronically Signed   By: Thomas  Register   On: 05/28/2019 14:08    Medications: Infusions: .  prismasol BGK 4/2.5 400 mL/hr at  0703  .  prismasol BGK 4/2.5 200 mL/hr at 05/31/2019 0702  . sodium chloride 10 mL/hr at 05/20/19 0910  . sodium chloride Stopped (06/11/2019 0722)  . sodium chloride Stopped (05/28/19 0127)  . sodium chloride    . amiodarone Stopped (05/28/2019 0555)  . DOBUTamine    . DOPamine 15 mcg/kg/min (06/21/2019 0800)  . epinephrine 19 mcg/min (06/21/2019 0816)  . fentaNYL infusion INTRAVENOUS 250 mcg/hr (05/26/2019 0800)  . fentaNYL infusion INTRAVENOUS    . impella catheter heparin 50 unit/mL in dextrose 5% 50,000 Units (05/23/19 0815)  . heparin 1,000 Units/hr (06/06/2019 0836)  . meropenem (MERREM) IV    . milrinone Stopped (06/24/2019 0153)  . norepinephrine (LEVOPHED) Adult infusion 40 mcg/min (06/02/2019 0800)  . pantoprozole (PROTONIX) infusion Stopped (05/31/2019 0752)  . prismasol BGK 4/2.5 2,000 mL/hr at 06/02/2019 0706  . vancomycin    . [START ON 05/30/2019] vancomycin      Scheduled Medications: . sodium chloride   Intravenous Once  . sodium chloride   Intravenous Once  . arformoterol  15 mcg Nebulization BID  . aspirin  81 mg Per Tube Daily  . atorvastatin  80 mg Per Tube q1800  . chlorhexidine  15 mL Mouth Rinse BID  . chlorhexidine gluconate (MEDLINE KIT)  15 mL Mouth Rinse BID  . Chlorhexidine  Gluconate Cloth  6 each Topical Daily  . feeding supplement (ENSURE ENLIVE)  237 mL Oral TID BM  . feeding supplement (PRO-STAT SUGAR FREE 64)  30 mL Oral BID  . insulin aspart  1-3 Units Subcutaneous Q4H  . ipratropium  0.5 mg Nebulization BID  . lidocaine-EPINEPHrine  20 mL Infiltration Once  . mouth rinse  15 mL Mouth Rinse q12n4p  . mouth rinse  15 mL Mouth Rinse 10 times per day  . metoCLOPramide (REGLAN) injection  5 mg Intravenous Q8H  . multivitamin with minerals  1 tablet Oral Daily  . [START ON 06/01/2019] pantoprazole  40 mg Intravenous Q12H  . senna-docusate  1 tablet Oral BID  . sodium chloride flush  10-40 mL Intracatheter Q12H  . sodium chloride flush  3 mL Intravenous Q12H    have reviewed scheduled and prn medications.  Physical Exam: General: Intubated and sedated Heart:RRR, s1s2 nl Lungs: Bibasal coarse breath sound, no wheezing Abdomen:soft, Non-tender, non-distended Extremities:No edema Dialysis Access: Left IJ catheter site clean.   Prasad  05/30/2019,8:37 AM  LOS: 13 days  Pager: [phone]  

## 2019-05-29 NOTE — Progress Notes (Signed)
Hourly ACT's being drawn and reported to Pharmacy for close titration due to bleeding overnight.

## 2019-05-29 NOTE — OR Nursing (Signed)
1802 2 HEART CALLED AND GIVEN SURGICAL UPDATE.

## 2019-05-29 NOTE — Brief Op Note (Signed)
06/11/2019  7:10 PM  PATIENT:  Raymond Chavez  68 y.o. male  PRE-OPERATIVE DIAGNOSIS:  Ischemic cardiomyopathy  POST-OPERATIVE DIAGNOSIS:  same  PROCEDURE:  Procedure(s) with comments: CANNULATION FOR ECMO (EXTRACORPOREAL MEMBRANE OXYGENATION) (N/A) - PARTIAL STERNOTOMY TRANSESOPHAGEAL ECHOCARDIOGRAM (TEE) (N/A)  SURGEON:  Surgeon(s) and Role:    * Wonda Olds, MD - Primary  PHYSICIAN ASSISTANT: none  ASSISTANTS: staff   ANESTHESIA:   get  EBL:  100 mL   BLOOD ADMINISTERED:none  DRAINS: 19 Fr fluted tube in the pericardial; space   LOCAL MEDICATIONS USED: none  SPECIMEN:  No Specimen  DISPOSITION OF SPECIMEN:  N/A  COUNTS:  YES  TOURNIQUET:  * No tourniquets in log *  DICTATION: .Note written in EPIC  PLAN OF CARE: Admit to inpatient   PATIENT DISPOSITION:  ICU - intubated and critically ill.   Delay start of Pharmacological VTE agent (>24hrs) due to surgical blood loss or risk of bleeding: yes  Raymond Tinnon [name], Raymond Chavez

## 2019-05-29 NOTE — CV Procedure (Signed)
ECMO INITIATION   Patient: Raymond Chavez, 1951-03-14, 68 y.o. Location:   Date of Service:  06/22/2019     Time: 6:12 PM  Date of Admission: 05/07/2019 Admitting diagnosis: Acute hypoxemic respiratory failure (HCC)  Ht: 5\' 7"  (170.2 cm)(measured x3) Wt: 72 kg BSA: Body surface area is 1.84 meters squared.  Blood Type: O POS Allergies: No Known Allergies  Past medical history:  Past Medical History:  Diagnosis Date  . Acute respiratory failure with hypoxemia (HCC) 04/2019  . Carotid artery occlusion   . Hypertension    Past surgical history:  Past Surgical History:  Procedure Laterality Date  . GROIN DISSECTION  05/03/2019   Procedure: right femoral artery repair and left heart catheterization;  Surgeon: 05/20/2019, MD;  Location: [facility] OR;  Service: Open Heart Surgery;;  . INSERTION OF IMPLANTABLE LEFT VENTRICULAR ASSIST DEVICE N/A 05/16/2019   Procedure: INSERTION OF IMPELLA 5.0 IMPLANTABLE LEFT VENTRICULAR ASSIST DEVICE THROUGH RIGHT AXILLARY; REMOVAL OF RIGHT GROIN IMPELLA CP;  Surgeon: 05/20/2019, MD;  Location: [facility] OR;  Service: Open Heart Surgery;  Laterality: N/A;  . RIGHT/LEFT HEART CATH AND CORONARY ANGIOGRAPHY N/A 05/20/2019   Procedure: RIGHT/LEFT HEART CATH AND CORONARY ANGIOGRAPHY;  Surgeon: 05/19/2019, MD;  Location: [facility] INVASIVE CV LAB;  Service: Cardiovascular;  Laterality: N/A;  . VENTRICULAR ASSIST DEVICE INSERTION N/A 05/20/2019   Procedure: VENTRICULAR ASSIST DEVICE INSERTION;  Surgeon: 05/19/2019, MD;  Location: [facility] INVASIVE CV LAB;  Service: Cardiovascular;  Laterality: N/A;    Indication for ECMO: Cardiogenic Shock  ECMO was deployed at 1630 and initiated at 1737  Anticoagulation achieved with Heparin bolus of 5000 units given to patient at 1732. Cannulated for ECMO Mode: VA and achieved initial ECMO Flow (LPM): 3.22 and ECMO Sweep Gas (LPM): 3.    ECMO Cannula Information     Staff Present  Primary Perfusionist [facility], CCP   Assisting Perfusionist/ECMO Specialist Lindell Spar, RN  Cannulating Physician B Atkins   ECMO Lot Numbers  CardioHelp Console  2  Oxygenator  Malachy Chamber  Tubing Pack [id number]  ECMO Goals  Cardiovascular Goals   3-4  Respiratory/ABG Goals   normal  Other Goals     Anticoagulation Goals   Per pharmacy   ECMO Handoff  Patient Information * Age Height Weight BSA IBW BMI  68 y.o. 5\' 7"  (170.2 cm)(measured x3)  (72 kg Body surface area is 1.84 meters squared. 67.3 kg  Body mass index is 24.86 kg/m.   Review History * Primary Diagnosis   Acute hypoxemic respiratory failure (HCC)  Prior Cardiac Arrest within 24hrs of ECMO initiation? yes  ECMO and MCS * Type ECMO Flow ECMO Sweep Gases   ECMO Device: Cardiohelp   Flow (LPM): 3.22   Sweep Gas (LPM): 3     Additional Mechanical Support Impella 5.0  Ventilation *    $ Ventilator Initial/Subsequent : Initial, Vent Mode: PRVC, Vt Set: 520 mL, Set Rate: 20 bmp, FiO2 (%): 80 %, I Time: 0.95 Sec(s), PEEP: 5 cmH20     Access Sites * Arterial  20    Central    Peripheral  23/25   *Cannula(e) sutured and anchored, secured and dressed.   Infusions and Interventions * Drugs/Dose    Interventions/Blood Products     Labs and Imaging *  *Cannulation position verified via imaging on arrival to ICU. Concerns communicated to attending surgeon. Labs reviewed.   All ECMO safety checks complete. ECMO flowsheet initiated, applicable charges captured, LDA's  entered/confirmed, imaging and labs verified, blood products available, and report given to Penny Pia, RN.

## 2019-05-29 NOTE — Progress Notes (Signed)
eLink Physician-Brief Progress Note Patient Name: Trevyon Swor DOB: 08/09/1950 MRN: [id number]   Date of Service  2019-06-22  HPI/Events of Note  Anemia - Hgb = 5.9.   eICU Interventions  Will transfuse 2 units PRBC now.      Intervention Category Major Interventions: Other:  Lysle Dingwall 2019/06/22, 3:32 AM

## 2019-05-29 NOTE — Procedures (Addendum)
Intubation Procedure Note Raymond Chavez [id number] 24-Dec-1950  Procedure: Intubation Indications: Respiratory insufficiency   Called at 1:15 AM by Elink to evaluate patient for possible intubation On initial evaluation pt in visible distress + bilateral rhonci and increased upper airway sounds suggestive of inability to manage his secretions. RT attempted NTS prior to my arrival and suctioned dark brown mucoid substance out which gave pt relief only for a short period of time.  On Milrinone, Amiodarone, Levophed @ 40 mcg and Heparin gtt S/p Impella  Procedure Details Consent: Unable to obtain consent because of emergent medical necessity. Time Out: Verified patient identification, verified procedure, site/side was marked, verified correct patient position, special equipment/implants available, medications/allergies/relevent history reviewed, required imaging and test results available.  Performed  RSI Ketamine 70 mg Propofol 30 mg Maximum sterile technique was used including cap, gloves, hand hygiene and mask.  MAC and 4  Upon visualizing vocal cords blood in oropharynx noted. Suctioned. Appears to repool from behind the vocal cords.  ETT inserted at 22>> copious amounts of blood from ETT. In line suctioned. With minimal desaturation. ETT secured by RT Connected to vent PRVC.     Evaluation Hemodynamic Status: Transient hypotension treated with pressors; O2 sats: stable throughout Patient's Current Condition: unstable Complications: none Patient did not tolerate procedure well. Chest X-ray ordered to verify placement.  CXR: tube position acceptable.ETT 3 cm above carina   Raymond Chavez Raymond Chavez 06/23/2019

## 2019-05-29 NOTE — Progress Notes (Addendum)
Patient ID: Raymond Chavez, male   DOB: 01-Apr-1951, 68 y.o.   MRN: [id number]  Called by CCM this evening to come to bedside for Code Blue.  Tonight, patient developed increasing respiratory distress, CCM initially called.  He was intubated.  Initially thought to have aspirated, but bronchoscopy showed copious blood return suggesting hemoptysis.  He was suctioned and given epinephrine down the tube and bleeding appeared to stop.  He developed PEA arrest during this time and underwent CPR with boluses of epinephrine and HCO3, total CPR time was 10-15 minutes. There was no shockable rhythm. Heparin gtt was stopped and heparin was transitioned to the purge fluid of the Impella.  Hgb was 5.7, patient received a total of 3 units PRBCs.  On dopamine 20, epinephrine 20, norepinephrine 40, he had SBP in the 80s. CXR showed bilateral upper lobe airspace opacities.   I imaged the Impella position under echo, inflow cannula remained at 3.7 cm and was not adjusted.  Currently, patient remains on P7 support with stable motor current.   CVVH will be restarted.    Patient did appear to make spontaneous movements with his arms.   Patient is in critical condition, very tenuous situation.  We attempted multiple times to contact the patient's daughter, son, and sister but were unable to get an answer.  Will continue full aggressive care until we are able to discuss the situation with family.  At this point, prognosis is increasingly poor.   CRITICAL CARE Performed by: Loralie Champagne  Total critical care time: 45 minutes  Critical care time was exclusive of separately billable procedures and treating other patients.  Critical care was necessary to treat or prevent imminent or life-threatening deterioration.  Critical care was time spent personally by me on the following activities: development of treatment plan with patient and/or surrogate as well as nursing, discussions with consultants, evaluation of patient's  response to treatment, examination of patient, obtaining history from patient or surrogate, ordering and performing treatments and interventions, ordering and review of laboratory studies, ordering and review of radiographic studies, pulse oximetry and re-evaluation of patient's condition.  Loralie Champagne 06/19/2019 3:07 AM

## 2019-05-29 NOTE — Transfer of Care (Signed)
Immediate Anesthesia Transfer of Care Note  Patient: Raymond Chavez  Procedure(s) Performed: CANNULATION FOR ECMO (EXTRACORPOREAL MEMBRANE OXYGENATION) (N/A ) TRANSESOPHAGEAL ECHOCARDIOGRAM (TEE) (N/A )  Patient Location: ICU  Anesthesia Type:General  Level of Consciousness: Patient remains intubated per anesthesia plan  Airway & Oxygen Therapy: Patient remains intubated per anesthesia plan and Patient placed on Ventilator (see vital sign flow sheet for setting)  Post-op Assessment: Report given to RN and Post -op Vital signs reviewed and stable  Post vital signs: Reviewed and stable  Last Vitals:  Vitals Value Taken Time  BP 102/73 06/12/2019 1846  Temp 36.1 C 06/11/2019 1849  Pulse    Resp 18 06/04/2019 1849  SpO2 100 % 06/12/2019 1849  Vitals shown include unvalidated device data.  Last Pain:  Vitals:   05/27/2019 1200  TempSrc: Core  PainSc:          Complications: No apparent anesthesia complications

## 2019-05-29 NOTE — Progress Notes (Signed)
Inpatient Rehab Admissions Coordinator:   Following along. Pt medical status declining.  Not appropriate for CIR at this time.  Will sign off.   Shann Medal, PT, DPT Admissions Coordinator [phone] 05/30/2019  9:18 AM

## 2019-05-29 NOTE — Progress Notes (Signed)
Pt back from OR via bag by CRNA. Pt placed on ventilator at 1843. Pt tolerating vent settings at this time. RT will continue to monitor.

## 2019-05-29 NOTE — Procedures (Addendum)
Bronchoscopy Procedure Note Raymond Chavez [id number] 1951/06/15  Procedure: Bronchoscopy Indications: Diagnostic evaluation of the airways and Remove secretions  Procedure Details Consent: Unable to obtain consent because of emergent medical necessity. Time Out: Verified patient identification, verified procedure, site/side was marked, verified correct patient position, special equipment/implants available, medications/allergies/relevent history reviewed, required imaging and test results available.  Performed  In preparation for procedure, patient was given 100% FiO2 and bronchoscope lubricated. Sedation: versed 1 mg and Fentanyl 50 mg  Airway entered and the following bronchi were examined: RUL, RML, RLL, LUL and LLL.   Bright red blood seen in the endotracheal tube and suctioned, carina observed, blood noted in both right and left main bronchus.  All airways patent post therapeutic suctioning Procedures performed: diagnostic evaluation and therapeutic suctioning  Evaluation Hemodynamic Status: Persistent hypotension treated with pressors; O2 sats: stable throughout Patient's Current Condition: unstable Specimens:  None Complications: No apparent complications Patient did not tolerate procedure well.   [name] 06/19/2019

## 2019-05-29 NOTE — Progress Notes (Signed)
Unable to establish and maintain an left radial arterial line. Able to get flash but unable to thread catheter. MD Scatliffe made aware.

## 2019-05-29 NOTE — Progress Notes (Signed)
eLink Physician-Brief Progress Note Patient Name: Raymond Chavez DOB: 11-01-1950 MRN: [id number]   Date of Service  28-Jun-2019  HPI/Events of Note  Respiratory distress - Poor cough and retained secretions. No improvement with NT suctioning and Albuterol Neb. Use of accessory muscles. Sat = 90% on 10 L/min HFNC. RR = 49-40.  eICU Interventions  Will ask ground team evaluate at bedside for possible intubation. Will increase the flow rate on HFNC until ground team able to evaluate the patient at bedside.      Intervention Category Major Interventions: Other:  Sommer,Steven Cornelia Copa 2019-06-28, 1:11 AM

## 2019-05-29 NOTE — Progress Notes (Signed)
Echocardiogram 2D Echocardiogram has been performed.  Oneal Deputy Naseer Hearn 06/07/2019, 10:27 AM

## 2019-05-29 NOTE — Anesthesia Preprocedure Evaluation (Addendum)
Anesthesia Evaluation  Patient identified by MRN, date of birth, ID band Patient unresponsive  Preop documentation limited or incomplete due to emergent nature of procedure.  Airway Mallampati: Intubated  TM Distance: >3 FB     Dental  (+) Dental Advisory Given   Pulmonary pneumonia, Current Smoker,    breath sounds clear to auscultation+ rhonchi        Cardiovascular hypertension, + Past MI and +CHF   Rhythm:Regular Rate:Normal  S/p impella. EF<20%   Neuro/Psych    GI/Hepatic negative GI ROS, Neg liver ROS,   Endo/Other  negative endocrine ROS  Renal/GU ARFRenal disease     Musculoskeletal   Abdominal   Peds  Hematology negative hematology ROS (+)   Anesthesia Other Findings   Reproductive/Obstetrics                             Lab Results  Component Value Date   WBC 30.1 (H) 06/07/2019   HGB 11.4 (L) 06/11/2019   HCT 34.2 (L) 06/10/2019   MCV 91.4 06/07/2019   PLT 54 (L) 06/12/2019   Lab Results  Component Value Date   CREATININE 3.01 (H) 06/01/2019   BUN 79 (H) 05/28/2019   NA 141 05/26/2019   K 4.5 05/28/2019   CL 100 06/19/2019   CO2 23 05/30/2019    Anesthesia Physical  Anesthesia Plan  ASA: V and emergent  Anesthesia Plan: General   Post-op Pain Management:    Induction: Inhalational  PONV Risk Score and Plan: 1 and Treatment may vary due to age or medical condition  Airway Management Planned: Oral ETT  Additional Equipment: Arterial line, CVP, PA Cath, TEE and Ultrasound Guidance Line Placement  Intra-op Plan:   Post-operative Plan: Post-operative intubation/ventilation  Informed Consent: I have reviewed the patients History and Physical, chart, labs and discussed the procedure including the risks, benefits and alternatives for the proposed anesthesia with the patient or authorized representative who has indicated his/her understanding and acceptance.        Plan Discussed with: CRNA  Anesthesia Plan Comments:        Anesthesia Quick Evaluation

## 2019-05-29 NOTE — Procedures (Signed)
Arterial Catheter Insertion Procedure Note Raymond Chavez [id number] April 20, 1951  Procedure: Insertion of Arterial Catheter  Indications: Blood pressure monitoring and Frequent blood sampling  Procedure Details Consent: Unable to obtain consent because of emergent medical necessity. Time Out: Verified patient identification, verified procedure, site/side was marked, verified correct patient position, special equipment/implants available, medications/allergies/relevent history reviewed, required imaging and test results available.  Performed  Maximum sterile technique was used including antiseptics, cap, gloves, gown, hand hygiene, mask and sheet. Skin prep: Chlorhexidine;  Right radial and right groin not able to be accessed due to impella. Current dampened aline in left radial.  Attempted cannulation of left femoral artery unsuccessful.  Catheterization was venous.  Catheter removed.  No bleeding complications.   Evaluation Blood flow good; BP tracing poor. Complications: No apparent complications.    Kennieth Rad, MSN, AGACNP-BC [facility] 06-19-2019, 5:57 AM     Kennieth Rad Jun 19, 2019

## 2019-05-29 NOTE — Code Documentation (Addendum)
..  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  Pt was intubated and started on mechanical ventilation.  Significant amt of blood from endotracheal tube prompted a bedside Bronchoscopy which revealed blood within both and right and left bronchial tree but no evidence of active bleeding or re-pooling. Upon cessation of Bronchoscopy noted to be more hypotensive unresponsive and no palpable pulse could be felt.   CODE STARTED AT 1: 48 AM. Compressions were started immediately.  Pt was promptly placed on a backboard, received pads pt connected to monitors.  Connect to AMBU bag by RT. Received medications as listed below ROSC 1: 56 AM,    Technical Description:  - CPR performance duration:  8 minutes  - Was defibrillation or cardioversion used? No   - Was external pacer placed? No  - Was patient intubated pre/post CPR? Yes    Medications Administered: Y = Yes; Blank = No Amiodarone    Atropine    Calcium    Epinephrine                       3  Lidocaine    Magnesium    Norepinephrine     Phenylephrine    Sodium bicarbonate                         3  Vasopressin      Post CPR evaluation:  - Final Status - Was patient successfully resuscitated ? Yes - What is current rhythm? Sinus - What is current hemodynamic status? Hypotensive being started on  additional vasopresors   Miscellaneous Information:  - Labs sent, including: BMET, CBC, ABG  - Primary team notified?  Yes  - Family Notified? Multiple attempts made to contact family via telephone unable to reach anyone  - Additional notes/ transfer status:  In critical condition.    Scatliffe, Rise Paganini, MD  06/24/2019, 5:13 AM

## 2019-05-29 NOTE — Op Note (Signed)
Procedure(s): CANNULATION FOR ECMO (EXTRACORPOREAL MEMBRANE OXYGENATION) TRANSESOPHAGEAL ECHOCARDIOGRAM (TEE) Procedure Note  Raymond Chavez male 68 y.o. 06/19/2019  Procedure(s) and Anesthesia Type:    * CANNULATION FOR ECMO (EXTRACORPOREAL MEMBRANE OXYGENATION) - General    * TRANSESOPHAGEAL ECHOCARDIOGRAM (TEE) - General  Surgeon(s) and Role:    * Wonda Olds, MD - Primary   Indications: The patient was admitted to the hospital approximately 2 weeks ago with cardiogenic shock related to ischemic disease accompanied by multiple organ dysfunction. He is taken to the OR for institution of Veno-arterial ecmo support.    Surgeon: Wonda Olds   Assistants: staff  Anesthesia: General endotracheal anesthesia  ASA Class: 5    Procedure Detail  CANNULATION FOR ECMO (EXTRACORPOREAL MEMBRANE OXYGENATION), TRANSESOPHAGEAL ECHOCARDIOGRAM (TEE) After obtaining informed consent, he is taken to the OR urgently and placed in the supine position on the OR table. Adequate anesthesia was confirmed and necessary monitoring lines were secured. The anterior chest, abdomen and groins were cleansed and draped sterilely. A pre-op surgical pause was performed. Percutaneous access of the right common femoral vein was achieved using ultrasound guidance. A wire was advanced into the central circulation under TEE guidance. Next, a partial upper sternotomy incision was made to just below the Angle of [place]. The sternum was divided to this level and a retractor was placed between the sternal halves. The pericardium was opened and pericardial tacking sutures were placed. 5,000 units of heparin was given intravenously. A multichannel venous cannula was inserted in the right common femoral vein, positioned by TEE guidance. The aorta was cannulated with a 20Fr Eopa arterial cannula. Both cannulae were de-aired and connected to the respective limbs of the ECLS circuit. Extracorporeal circulation was begun, and  the indwelling Impella flows were reduced to P2. Hemodynamics improved immediately. The cannulae were secured at the skin level. A 19 Fr Blake drain was inserted in the pericardium through an inferiorly oriented stab wound.  The sternal incision was closed in layers after partial bone reapproximation. Sterile dressings were applied. All sponge, instrument, and needle counts were correct.   Findings: As above  Estimated Blood Loss:  less than 100 mL         Drains: as above         Total IV Fluids: per anesthesia  Blood Given: none          Specimens: none         Implants: none        Complications:  * No complications entered in OR log *         Disposition: ICU - intubated and critically ill.         Condition: stable  [name] Z. [name], [name]

## 2019-05-30 ENCOUNTER — Inpatient Hospital Stay (HOSPITAL_COMMUNITY): Payer: PPO

## 2019-05-30 ENCOUNTER — Encounter (HOSPITAL_COMMUNITY): Payer: Self-pay | Admitting: Cardiothoracic Surgery

## 2019-05-30 DIAGNOSIS — I5043 Acute on chronic combined systolic (congestive) and diastolic (congestive) heart failure: Secondary | ICD-10-CM

## 2019-05-30 LAB — POCT I-STAT 7, (LYTES, BLD GAS, ICA,H+H)
Acid-Base Excess: 3 mmol/L — ABNORMAL HIGH (ref 0.0–2.0)
Acid-Base Excess: 3 mmol/L — ABNORMAL HIGH (ref 0.0–2.0)
Acid-Base Excess: 2 mmol/L (ref 0.0–2.0)
Acid-Base Excess: 4 mmol/L — ABNORMAL HIGH (ref 0.0–2.0)
Acid-Base Excess: 2 mmol/L (ref 0.0–2.0)
Acid-Base Excess: 5 mmol/L — ABNORMAL HIGH (ref 0.0–2.0)
Acid-Base Excess: 6 mmol/L — ABNORMAL HIGH (ref 0.0–2.0)
Acid-Base Excess: 5 mmol/L — ABNORMAL HIGH (ref 0.0–2.0)
Acid-Base Excess: 3 mmol/L — ABNORMAL HIGH (ref 0.0–2.0)
Acid-Base Excess: 6 mmol/L — ABNORMAL HIGH (ref 0.0–2.0)
Acid-Base Excess: 3 mmol/L — ABNORMAL HIGH (ref 0.0–2.0)
Bicarbonate: 29.2 mmol/L — ABNORMAL HIGH (ref 20.0–28.0)
Bicarbonate: 30.4 mmol/L — ABNORMAL HIGH (ref 20.0–28.0)
Bicarbonate: 29.7 mmol/L — ABNORMAL HIGH (ref 20.0–28.0)
Bicarbonate: 30.2 mmol/L — ABNORMAL HIGH (ref 20.0–28.0)
Bicarbonate: 29.1 mmol/L — ABNORMAL HIGH (ref 20.0–28.0)
Bicarbonate: 28.8 mmol/L — ABNORMAL HIGH (ref 20.0–28.0)
Bicarbonate: 29.5 mmol/L — ABNORMAL HIGH (ref 20.0–28.0)
Bicarbonate: 29.1 mmol/L — ABNORMAL HIGH (ref 20.0–28.0)
Bicarbonate: 27.8 mmol/L (ref 20.0–28.0)
Bicarbonate: 28.3 mmol/L — ABNORMAL HIGH (ref 20.0–28.0)
Bicarbonate: 27.5 mmol/L (ref 20.0–28.0)
Calcium, Ion: 1 mmol/L — ABNORMAL LOW (ref 1.15–1.40)
Calcium, Ion: 1 mmol/L — ABNORMAL LOW (ref 1.15–1.40)
Calcium, Ion: 1.01 mmol/L — ABNORMAL LOW (ref 1.15–1.40)
Calcium, Ion: 1.01 mmol/L — ABNORMAL LOW (ref 1.15–1.40)
Calcium, Ion: 1.02 mmol/L — ABNORMAL LOW (ref 1.15–1.40)
Calcium, Ion: 0.95 mmol/L — ABNORMAL LOW (ref 1.15–1.40)
Calcium, Ion: 0.97 mmol/L — ABNORMAL LOW (ref 1.15–1.40)
Calcium, Ion: 1.01 mmol/L — ABNORMAL LOW (ref 1.15–1.40)
Calcium, Ion: 0.96 mmol/L — ABNORMAL LOW (ref 1.15–1.40)
Calcium, Ion: 0.95 mmol/L — ABNORMAL LOW (ref 1.15–1.40)
Calcium, Ion: 0.99 mmol/L — ABNORMAL LOW (ref 1.15–1.40)
HCT: 25 % — ABNORMAL LOW (ref 39.0–52.0)
HCT: 24 % — ABNORMAL LOW (ref 39.0–52.0)
HCT: 26 % — ABNORMAL LOW (ref 39.0–52.0)
HCT: 24 % — ABNORMAL LOW (ref 39.0–52.0)
HCT: 24 % — ABNORMAL LOW (ref 39.0–52.0)
HCT: 21 % — ABNORMAL LOW (ref 39.0–52.0)
HCT: 23 % — ABNORMAL LOW (ref 39.0–52.0)
HCT: 22 % — ABNORMAL LOW (ref 39.0–52.0)
HCT: 26 % — ABNORMAL LOW (ref 39.0–52.0)
HCT: 20 % — ABNORMAL LOW (ref 39.0–52.0)
HCT: 24 % — ABNORMAL LOW (ref 39.0–52.0)
Hemoglobin: 8.5 g/dL — ABNORMAL LOW (ref 13.0–17.0)
Hemoglobin: 8.2 g/dL — ABNORMAL LOW (ref 13.0–17.0)
Hemoglobin: 8.8 g/dL — ABNORMAL LOW (ref 13.0–17.0)
Hemoglobin: 8.2 g/dL — ABNORMAL LOW (ref 13.0–17.0)
Hemoglobin: 8.2 g/dL — ABNORMAL LOW (ref 13.0–17.0)
Hemoglobin: 7.1 g/dL — ABNORMAL LOW (ref 13.0–17.0)
Hemoglobin: 7.8 g/dL — ABNORMAL LOW (ref 13.0–17.0)
Hemoglobin: 7.5 g/dL — ABNORMAL LOW (ref 13.0–17.0)
Hemoglobin: 8.8 g/dL — ABNORMAL LOW (ref 13.0–17.0)
Hemoglobin: 6.8 g/dL — CL (ref 13.0–17.0)
Hemoglobin: 8.2 g/dL — ABNORMAL LOW (ref 13.0–17.0)
O2 Saturation: 100 %
O2 Saturation: 100 %
O2 Saturation: 100 %
O2 Saturation: 100 %
O2 Saturation: 100 %
O2 Saturation: 100 %
O2 Saturation: 100 %
O2 Saturation: 100 %
O2 Saturation: 100 %
O2 Saturation: 100 %
O2 Saturation: 100 %
Patient temperature: 37
Patient temperature: 36.6
Patient temperature: 36.9
Patient temperature: 37.2
Patient temperature: 37.2
Patient temperature: 37.3
Patient temperature: 37.3
Potassium: 5 mmol/L (ref 3.5–5.1)
Potassium: 5.2 mmol/L — ABNORMAL HIGH (ref 3.5–5.1)
Potassium: 5.2 mmol/L — ABNORMAL HIGH (ref 3.5–5.1)
Potassium: 5.2 mmol/L — ABNORMAL HIGH (ref 3.5–5.1)
Potassium: 5.2 mmol/L — ABNORMAL HIGH (ref 3.5–5.1)
Potassium: 4.9 mmol/L (ref 3.5–5.1)
Potassium: 4.8 mmol/L (ref 3.5–5.1)
Potassium: 4.5 mmol/L (ref 3.5–5.1)
Potassium: 5.1 mmol/L (ref 3.5–5.1)
Potassium: 4.4 mmol/L (ref 3.5–5.1)
Potassium: 4.7 mmol/L (ref 3.5–5.1)
Sodium: 139 mmol/L (ref 135–145)
Sodium: 139 mmol/L (ref 135–145)
Sodium: 139 mmol/L (ref 135–145)
Sodium: 139 mmol/L (ref 135–145)
Sodium: 138 mmol/L (ref 135–145)
Sodium: 136 mmol/L (ref 135–145)
Sodium: 137 mmol/L (ref 135–145)
Sodium: 136 mmol/L (ref 135–145)
Sodium: 139 mmol/L (ref 135–145)
Sodium: 138 mmol/L (ref 135–145)
Sodium: 138 mmol/L (ref 135–145)
TCO2: 31 mmol/L (ref 22–32)
TCO2: 32 mmol/L (ref 22–32)
TCO2: 32 mmol/L (ref 22–32)
TCO2: 32 mmol/L (ref 22–32)
TCO2: 31 mmol/L (ref 22–32)
TCO2: 30 mmol/L (ref 22–32)
TCO2: 31 mmol/L (ref 22–32)
TCO2: 30 mmol/L (ref 22–32)
TCO2: 29 mmol/L (ref 22–32)
TCO2: 29 mmol/L (ref 22–32)
TCO2: 29 mmol/L (ref 22–32)
pCO2 arterial: 55.5 mmHg — ABNORMAL HIGH (ref 32.0–48.0)
pCO2 arterial: 59.6 mmHg — ABNORMAL HIGH (ref 32.0–48.0)
pCO2 arterial: 61.2 mmHg — ABNORMAL HIGH (ref 32.0–48.0)
pCO2 arterial: 57.8 mmHg — ABNORMAL HIGH (ref 32.0–48.0)
pCO2 arterial: 56.8 mmHg — ABNORMAL HIGH (ref 32.0–48.0)
pCO2 arterial: 36.5 mmHg (ref 32.0–48.0)
pCO2 arterial: 37.8 mmHg (ref 32.0–48.0)
pCO2 arterial: 43.1 mmHg (ref 32.0–48.0)
pCO2 arterial: 45 mmHg (ref 32.0–48.0)
pCO2 arterial: 31.2 mmHg — ABNORMAL LOW (ref 32.0–48.0)
pCO2 arterial: 42.2 mmHg (ref 32.0–48.0)
pH, Arterial: 7.329 — ABNORMAL LOW (ref 7.350–7.450)
pH, Arterial: 7.315 — ABNORMAL LOW (ref 7.350–7.450)
pH, Arterial: 7.293 — ABNORMAL LOW (ref 7.350–7.450)
pH, Arterial: 7.326 — ABNORMAL LOW (ref 7.350–7.450)
pH, Arterial: 7.317 — ABNORMAL LOW (ref 7.350–7.450)
pH, Arterial: 7.503 — ABNORMAL HIGH (ref 7.350–7.450)
pH, Arterial: 7.5 — ABNORMAL HIGH (ref 7.350–7.450)
pH, Arterial: 7.439 (ref 7.350–7.450)
pH, Arterial: 7.399 (ref 7.350–7.450)
pH, Arterial: 7.567 — ABNORMAL HIGH (ref 7.350–7.450)
pH, Arterial: 7.423 (ref 7.350–7.450)
pO2, Arterial: 556 mmHg — ABNORMAL HIGH (ref 83.0–108.0)
pO2, Arterial: 564 mmHg — ABNORMAL HIGH (ref 83.0–108.0)
pO2, Arterial: 557 mmHg — ABNORMAL HIGH (ref 83.0–108.0)
pO2, Arterial: 560 mmHg — ABNORMAL HIGH (ref 83.0–108.0)
pO2, Arterial: 515 mmHg — ABNORMAL HIGH (ref 83.0–108.0)
pO2, Arterial: 502 mmHg — ABNORMAL HIGH (ref 83.0–108.0)
pO2, Arterial: 498 mmHg — ABNORMAL HIGH (ref 83.0–108.0)
pO2, Arterial: 511 mmHg — ABNORMAL HIGH (ref 83.0–108.0)
pO2, Arterial: 533 mmHg — ABNORMAL HIGH (ref 83.0–108.0)
pO2, Arterial: 510 mmHg — ABNORMAL HIGH (ref 83.0–108.0)
pO2, Arterial: 557 mmHg — ABNORMAL HIGH (ref 83.0–108.0)

## 2019-05-30 LAB — COMPREHENSIVE METABOLIC PANEL
ALT: 38 U/L (ref 0–44)
ALT: 38 U/L (ref 0–44)
ALT: 31 U/L (ref 0–44)
ALT: 28 U/L (ref 0–44)
AST: 34 U/L (ref 15–41)
AST: 31 U/L (ref 15–41)
AST: 24 U/L (ref 15–41)
AST: 22 U/L (ref 15–41)
Albumin: 2.4 g/dL — ABNORMAL LOW (ref 3.5–5.0)
Albumin: 2.4 g/dL — ABNORMAL LOW (ref 3.5–5.0)
Albumin: 2.4 g/dL — ABNORMAL LOW (ref 3.5–5.0)
Albumin: 2.2 g/dL — ABNORMAL LOW (ref 3.5–5.0)
Alkaline Phosphatase: 43 U/L (ref 38–126)
Alkaline Phosphatase: 44 U/L (ref 38–126)
Alkaline Phosphatase: 36 U/L — ABNORMAL LOW (ref 38–126)
Alkaline Phosphatase: 37 U/L — ABNORMAL LOW (ref 38–126)
Anion gap: 9 (ref 5–15)
Anion gap: 8 (ref 5–15)
Anion gap: 8 (ref 5–15)
Anion gap: 7 (ref 5–15)
BUN: 45 mg/dL — ABNORMAL HIGH (ref 8–23)
BUN: 42 mg/dL — ABNORMAL HIGH (ref 8–23)
BUN: 38 mg/dL — ABNORMAL HIGH (ref 8–23)
BUN: 37 mg/dL — ABNORMAL HIGH (ref 8–23)
CO2: 25 mmol/L (ref 22–32)
CO2: 25 mmol/L (ref 22–32)
CO2: 23 mmol/L (ref 22–32)
CO2: 25 mmol/L (ref 22–32)
Calcium: 6.9 mg/dL — ABNORMAL LOW (ref 8.9–10.3)
Calcium: 6.9 mg/dL — ABNORMAL LOW (ref 8.9–10.3)
Calcium: 6.8 mg/dL — ABNORMAL LOW (ref 8.9–10.3)
Calcium: 6.9 mg/dL — ABNORMAL LOW (ref 8.9–10.3)
Chloride: 105 mmol/L (ref 98–111)
Chloride: 106 mmol/L (ref 98–111)
Chloride: 106 mmol/L (ref 98–111)
Chloride: 106 mmol/L (ref 98–111)
Creatinine, Ser: 1.98 mg/dL — ABNORMAL HIGH (ref 0.61–1.24)
Creatinine, Ser: 1.93 mg/dL — ABNORMAL HIGH (ref 0.61–1.24)
Creatinine, Ser: 1.82 mg/dL — ABNORMAL HIGH (ref 0.61–1.24)
Creatinine, Ser: 1.69 mg/dL — ABNORMAL HIGH (ref 0.61–1.24)
GFR calc Af Amer: 39 mL/min — ABNORMAL LOW (ref >60)
GFR calc Af Amer: 40 mL/min — ABNORMAL LOW (ref >60)
GFR calc Af Amer: 43 mL/min — ABNORMAL LOW (ref >60)
GFR calc Af Amer: 47 mL/min — ABNORMAL LOW (ref >60)
GFR calc non Af Amer: 33 mL/min — ABNORMAL LOW (ref >60)
GFR calc non Af Amer: 35 mL/min — ABNORMAL LOW (ref >60)
GFR calc non Af Amer: 37 mL/min — ABNORMAL LOW (ref >60)
GFR calc non Af Amer: 41 mL/min — ABNORMAL LOW (ref >60)
Glucose, Bld: 110 mg/dL — ABNORMAL HIGH (ref 70–99)
Glucose, Bld: 113 mg/dL — ABNORMAL HIGH (ref 70–99)
Glucose, Bld: 145 mg/dL — ABNORMAL HIGH (ref 70–99)
Glucose, Bld: 152 mg/dL — ABNORMAL HIGH (ref 70–99)
Potassium: 5 mmol/L (ref 3.5–5.1)
Potassium: 5.2 mmol/L — ABNORMAL HIGH (ref 3.5–5.1)
Potassium: 4.7 mmol/L (ref 3.5–5.1)
Potassium: 4.7 mmol/L (ref 3.5–5.1)
Sodium: 139 mmol/L (ref 135–145)
Sodium: 139 mmol/L (ref 135–145)
Sodium: 137 mmol/L (ref 135–145)
Sodium: 138 mmol/L (ref 135–145)
Total Bilirubin: 1.6 mg/dL — ABNORMAL HIGH (ref 0.3–1.2)
Total Bilirubin: 1.7 mg/dL — ABNORMAL HIGH (ref 0.3–1.2)
Total Bilirubin: 1.9 mg/dL — ABNORMAL HIGH (ref 0.3–1.2)
Total Bilirubin: 1.3 mg/dL — ABNORMAL HIGH (ref 0.3–1.2)
Total Protein: 4.4 g/dL — ABNORMAL LOW (ref 6.5–8.1)
Total Protein: 4.4 g/dL — ABNORMAL LOW (ref 6.5–8.1)
Total Protein: 4.1 g/dL — ABNORMAL LOW (ref 6.5–8.1)
Total Protein: 4.1 g/dL — ABNORMAL LOW (ref 6.5–8.1)

## 2019-05-30 LAB — PREPARE RBC (CROSSMATCH)

## 2019-05-30 LAB — POCT I-STAT EG7
Acid-Base Excess: 2 mmol/L (ref 0.0–2.0)
Acid-Base Excess: 7 mmol/L — ABNORMAL HIGH (ref 0.0–2.0)
Bicarbonate: 27.9 mmol/L (ref 20.0–28.0)
Bicarbonate: 29.3 mmol/L — ABNORMAL HIGH (ref 20.0–28.0)
Calcium, Ion: 0.95 mmol/L — ABNORMAL LOW (ref 1.15–1.40)
Calcium, Ion: 1.1 mmol/L — ABNORMAL LOW (ref 1.15–1.40)
HCT: 21 % — ABNORMAL LOW (ref 39.0–52.0)
HCT: 29 % — ABNORMAL LOW (ref 39.0–52.0)
Hemoglobin: 7.1 g/dL — ABNORMAL LOW (ref 13.0–17.0)
Hemoglobin: 9.9 g/dL — ABNORMAL LOW (ref 13.0–17.0)
O2 Saturation: 100 %
O2 Saturation: 100 %
Patient temperature: 37
Patient temperature: 37.2
Potassium: 4.5 mmol/L (ref 3.5–5.1)
Potassium: 4.8 mmol/L (ref 3.5–5.1)
Sodium: 138 mmol/L (ref 135–145)
Sodium: 139 mmol/L (ref 135–145)
TCO2: 29 mmol/L (ref 22–32)
TCO2: 30 mmol/L (ref 22–32)
pCO2, Ven: 31.4 mmHg — ABNORMAL LOW (ref 44.0–60.0)
pCO2, Ven: 49.9 mmHg (ref 44.0–60.0)
pH, Ven: 7.356 (ref 7.250–7.430)
pH, Ven: 7.578 — ABNORMAL HIGH (ref 7.250–7.430)
pO2, Ven: 488 mmHg — ABNORMAL HIGH (ref 32.0–45.0)
pO2, Ven: 488 mmHg — ABNORMAL HIGH (ref 32.0–45.0)

## 2019-05-30 LAB — LACTATE DEHYDROGENASE: LDH: 512 U/L — ABNORMAL HIGH (ref 98–192)

## 2019-05-30 LAB — POCT ACTIVATED CLOTTING TIME
Activated Clotting Time: 0 seconds
Activated Clotting Time: 158 seconds
Activated Clotting Time: 164 seconds
Activated Clotting Time: 164 seconds
Activated Clotting Time: 169 seconds
Activated Clotting Time: 180 seconds
Activated Clotting Time: 186 seconds
Activated Clotting Time: 197 seconds

## 2019-05-30 LAB — BPAM PLATELET PHERESIS
Blood Product Expiration Date: 202011072359
Blood Product Expiration Date: 202011062359
Blood Product Expiration Date: 202011062359
ISSUE DATE / TIME: 202011041718
ISSUE DATE / TIME: 202011042101
ISSUE DATE / TIME: 202011042101
Unit Type and Rh: 6200
Unit Type and Rh: 5100
Unit Type and Rh: 7300

## 2019-05-30 LAB — PREPARE CRYOPRECIPITATE: Unit division: 0

## 2019-05-30 LAB — CBC WITH DIFFERENTIAL/PLATELET
Abs Immature Granulocytes: 0.19 10*3/uL — ABNORMAL HIGH (ref 0.00–0.07)
Basophils Absolute: 0 10*3/uL (ref 0.0–0.1)
Basophils Relative: 0 %
Eosinophils Absolute: 0 10*3/uL (ref 0.0–0.5)
Eosinophils Relative: 0 %
HCT: 26.4 % — ABNORMAL LOW (ref 39.0–52.0)
Hemoglobin: 9 g/dL — ABNORMAL LOW (ref 13.0–17.0)
Immature Granulocytes: 1 %
Lymphocytes Relative: 10 %
Lymphs Abs: 1.5 10*3/uL (ref 0.7–4.0)
MCH: 30.7 pg (ref 26.0–34.0)
MCHC: 34.1 g/dL (ref 30.0–36.0)
MCV: 90.1 fL (ref 80.0–100.0)
Monocytes Absolute: 1 10*3/uL (ref 0.1–1.0)
Monocytes Relative: 6 %
Neutro Abs: 12.9 10*3/uL — ABNORMAL HIGH (ref 1.7–7.7)
Neutrophils Relative %: 83 %
Platelets: 97 10*3/uL — ABNORMAL LOW (ref 150–400)
RBC: 2.93 MIL/uL — ABNORMAL LOW (ref 4.22–5.81)
RDW: 14.9 % (ref 11.5–15.5)
WBC: 15.6 10*3/uL — ABNORMAL HIGH (ref 4.0–10.5)
nRBC: 0.8 % — ABNORMAL HIGH (ref 0.0–0.2)

## 2019-05-30 LAB — PREPARE FRESH FROZEN PLASMA
Unit division: 0
Unit division: 0

## 2019-05-30 LAB — PROTIME-INR
INR: 1.5 — ABNORMAL HIGH (ref 0.8–1.2)
INR: 1.4 — ABNORMAL HIGH (ref 0.8–1.2)
INR: 1.5 — ABNORMAL HIGH (ref 0.8–1.2)
Prothrombin Time: 17.4 seconds — ABNORMAL HIGH (ref 11.4–15.2)
Prothrombin Time: 16.7 seconds — ABNORMAL HIGH (ref 11.4–15.2)
Prothrombin Time: 17.5 seconds — ABNORMAL HIGH (ref 11.4–15.2)

## 2019-05-30 LAB — ECHOCARDIOGRAM LIMITED
Height: 67 in
Weight: 2539.7 oz

## 2019-05-30 LAB — CBC
HCT: 26.7 % — ABNORMAL LOW (ref 39.0–52.0)
HCT: 23.5 % — ABNORMAL LOW (ref 39.0–52.0)
HCT: 22.9 % — ABNORMAL LOW (ref 39.0–52.0)
HCT: 21.6 % — ABNORMAL LOW (ref 39.0–52.0)
Hemoglobin: 9.2 g/dL — ABNORMAL LOW (ref 13.0–17.0)
Hemoglobin: 8 g/dL — ABNORMAL LOW (ref 13.0–17.0)
Hemoglobin: 7.9 g/dL — ABNORMAL LOW (ref 13.0–17.0)
Hemoglobin: 7.5 g/dL — ABNORMAL LOW (ref 13.0–17.0)
MCH: 30.9 pg (ref 26.0–34.0)
MCH: 30.7 pg (ref 26.0–34.0)
MCH: 30.9 pg (ref 26.0–34.0)
MCH: 31.5 pg (ref 26.0–34.0)
MCHC: 34.5 g/dL (ref 30.0–36.0)
MCHC: 34 g/dL (ref 30.0–36.0)
MCHC: 34.5 g/dL (ref 30.0–36.0)
MCHC: 34.7 g/dL (ref 30.0–36.0)
MCV: 89.6 fL (ref 80.0–100.0)
MCV: 90 fL (ref 80.0–100.0)
MCV: 89.5 fL (ref 80.0–100.0)
MCV: 90.8 fL (ref 80.0–100.0)
Platelets: 77 10*3/uL — ABNORMAL LOW (ref 150–400)
Platelets: 76 10*3/uL — ABNORMAL LOW (ref 150–400)
Platelets: 73 K/uL — ABNORMAL LOW (ref 150–400)
Platelets: 76 10*3/uL — ABNORMAL LOW (ref 150–400)
RBC: 2.98 MIL/uL — ABNORMAL LOW (ref 4.22–5.81)
RBC: 2.61 MIL/uL — ABNORMAL LOW (ref 4.22–5.81)
RBC: 2.56 MIL/uL — ABNORMAL LOW (ref 4.22–5.81)
RBC: 2.38 MIL/uL — ABNORMAL LOW (ref 4.22–5.81)
RDW: 14.4 % (ref 11.5–15.5)
RDW: 15.1 % (ref 11.5–15.5)
RDW: 15.1 % (ref 11.5–15.5)
RDW: 15.4 % (ref 11.5–15.5)
WBC: 15 10*3/uL — ABNORMAL HIGH (ref 4.0–10.5)
WBC: 14.2 10*3/uL — ABNORMAL HIGH (ref 4.0–10.5)
WBC: 13.7 K/uL — ABNORMAL HIGH (ref 4.0–10.5)
WBC: 16 10*3/uL — ABNORMAL HIGH (ref 4.0–10.5)
nRBC: 1.1 % — ABNORMAL HIGH (ref 0.0–0.2)
nRBC: 0.8 % — ABNORMAL HIGH (ref 0.0–0.2)
nRBC: 1 % — ABNORMAL HIGH (ref 0.0–0.2)
nRBC: 0.7 % — ABNORMAL HIGH (ref 0.0–0.2)

## 2019-05-30 LAB — PHOSPHORUS
Phosphorus: 6.2 mg/dL — ABNORMAL HIGH (ref 2.5–4.6)
Phosphorus: 5.1 mg/dL — ABNORMAL HIGH (ref 2.5–4.6)
Phosphorus: 4.5 mg/dL (ref 2.5–4.6)

## 2019-05-30 LAB — PREPARE PLATELET PHERESIS
Unit division: 0
Unit division: 0
Unit division: 0

## 2019-05-30 LAB — HEMOGLOBIN AND HEMATOCRIT, BLOOD
HCT: 23.3 % — ABNORMAL LOW (ref 39.0–52.0)
Hemoglobin: 8 g/dL — ABNORMAL LOW (ref 13.0–17.0)

## 2019-05-30 LAB — BPAM CRYOPRECIPITATE
Blood Product Expiration Date: 202011042346
ISSUE DATE / TIME: 202011042102
Unit Type and Rh: 5100

## 2019-05-30 LAB — GLUCOSE, CAPILLARY
Glucose-Capillary: 114 mg/dL — ABNORMAL HIGH (ref 70–99)
Glucose-Capillary: 84 mg/dL (ref 70–99)
Glucose-Capillary: 89 mg/dL (ref 70–99)
Glucose-Capillary: 96 mg/dL (ref 70–99)
Glucose-Capillary: 98 mg/dL (ref 70–99)

## 2019-05-30 LAB — BPAM FFP
Blood Product Expiration Date: 202011092359
Blood Product Expiration Date: 202011092359
ISSUE DATE / TIME: 202011042124
ISSUE DATE / TIME: 202011042124
Unit Type and Rh: 5100
Unit Type and Rh: 5100

## 2019-05-30 LAB — APTT
aPTT: 127 seconds — ABNORMAL HIGH (ref 24–36)
aPTT: >200 seconds (ref 24–36)
aPTT: >200 seconds (ref 24–36)
aPTT: 89 seconds — ABNORMAL HIGH (ref 24–36)
aPTT: 78 seconds — ABNORMAL HIGH (ref 24–36)
aPTT: 48 s — ABNORMAL HIGH (ref 24–36)
aPTT: 37 s — ABNORMAL HIGH (ref 24–36)

## 2019-05-30 LAB — FIBRINOGEN
Fibrinogen: 346 mg/dL (ref 210–475)
Fibrinogen: 320 mg/dL (ref 210–475)

## 2019-05-30 LAB — POTASSIUM: Potassium: 3.5 mmol/L (ref 3.5–5.1)

## 2019-05-30 LAB — LACTIC ACID, PLASMA
Lactic Acid, Venous: 1 mmol/L (ref 0.5–1.9)
Lactic Acid, Venous: 1 mmol/L (ref 0.5–1.9)

## 2019-05-30 LAB — MAGNESIUM
Magnesium: 2.4 mg/dL (ref 1.7–2.4)
Magnesium: 2.4 mg/dL (ref 1.7–2.4)
Magnesium: 2.5 mg/dL — ABNORMAL HIGH (ref 1.7–2.4)

## 2019-05-30 LAB — HEPARIN LEVEL (UNFRACTIONATED): Heparin Unfractionated: 0.79 IU/mL — ABNORMAL HIGH (ref 0.30–0.70)

## 2019-05-30 MED ORDER — PRISMASOL BGK 0/2.5 32-2.5 MEQ/L REPLACEMENT SOLN
Status: DC
  Administered 2019-05-30: 10:00:00 via INTRAVENOUS_CENTRAL
  Filled 2019-05-30 (×3): qty 5000

## 2019-05-30 MED ORDER — ALBUMIN HUMAN 5 % IV SOLN
INTRAVENOUS | Status: AC
  Filled 2019-05-30: qty 250

## 2019-05-30 MED ORDER — IOHEXOL 300 MG/ML  SOLN
50.0000 mL | Freq: Once | INTRAMUSCULAR | Status: AC | PRN
  Administered 2019-05-30: 50 mL

## 2019-05-30 MED ORDER — PRO-STAT SUGAR FREE PO LIQD
30.0000 mL | Freq: Two times a day (BID) | ORAL | Status: DC
  Administered 2019-05-30: 30 mL
  Filled 2019-05-30: qty 30

## 2019-05-30 MED ORDER — PRO-STAT SUGAR FREE PO LIQD
30.0000 mL | Freq: Four times a day (QID) | ORAL | Status: DC
  Administered 2019-05-30 – 2019-06-04 (×22): 30 mL
  Filled 2019-05-30 (×22): qty 30

## 2019-05-30 MED ORDER — LIDOCAINE VISCOUS HCL 2 % MT SOLN
15.0000 mL | Freq: Once | OROMUCOSAL | Status: AC
  Administered 2019-05-30: 12:00:00 3 mL via OROMUCOSAL

## 2019-05-30 MED ORDER — HEPARIN BOLUS VIA INFUSION
2000.0000 [IU] | Freq: Once | INTRAVENOUS | Status: AC
  Administered 2019-05-30: 2000 [IU] via INTRAVENOUS
  Filled 2019-05-30: qty 2000

## 2019-05-30 MED ORDER — VITAL HIGH PROTEIN PO LIQD: 1000.0000 mL | ORAL | Status: DC

## 2019-05-30 MED ORDER — PRISMASOL BGK 0/2.5 32-2.5 MEQ/L IV SOLN
INTRAVENOUS | Status: DC
  Administered 2019-05-30: 21:00:00 via INTRAVENOUS_CENTRAL
  Filled 2019-05-30 (×6): qty 5000

## 2019-05-30 MED ORDER — SODIUM CHLORIDE 0.9% IV SOLUTION
Freq: Once | INTRAVENOUS | Status: AC
  Administered 2019-05-30: 20:00:00 via INTRAVENOUS

## 2019-05-30 MED ORDER — SODIUM CHLORIDE 0.9% IV SOLUTION
Freq: Once | INTRAVENOUS | Status: AC
  Administered 2019-05-30: 18:00:00 via INTRAVENOUS

## 2019-05-30 MED ORDER — SODIUM CHLORIDE 0.9% IV SOLUTION: Freq: Once | INTRAVENOUS | Status: DC

## 2019-05-30 MED ORDER — HEPARIN (PORCINE) 25000 UT/250ML-% IV SOLN: 100.0000 [IU]/h | INTRAVENOUS | Status: DC

## 2019-05-30 MED ORDER — ALBUMIN HUMAN 5 % IV SOLN
25.0000 g | Freq: Once | INTRAVENOUS | Status: AC
  Administered 2019-05-30: 20:00:00 25 g via INTRAVENOUS
  Filled 2019-05-30: qty 250

## 2019-05-30 MED ORDER — VITAL 1.5 CAL PO LIQD
1000.0000 mL | ORAL | Status: DC
  Administered 2019-05-30 – 2019-06-04 (×5): 1000 mL
  Filled 2019-05-30 (×9): qty 1000

## 2019-05-30 MED ORDER — B COMPLEX-C PO TABS
1.0000 | ORAL_TABLET | Freq: Every day | ORAL | Status: DC
  Administered 2019-05-30 – 2019-06-08 (×8): 1
  Filled 2019-05-30 (×11): qty 1

## 2019-05-30 MED ORDER — PRISMASOL BGK 0/2.5 32-2.5 MEQ/L REPLACEMENT SOLN
Status: DC
  Administered 2019-05-30: 10:00:00 via INTRAVENOUS_CENTRAL
  Filled 2019-05-30 (×2): qty 5000

## 2019-05-30 MED ORDER — HEPARIN BOLUS VIA INFUSION
2000.0000 [IU] | Freq: Once | INTRAVENOUS | Status: AC
  Administered 2019-05-30: 2000 [IU] via INTRAVENOUS

## 2019-05-30 MED ORDER — PRISMASOL BGK 4/2.5 32-4-2.5 MEQ/L REPLACEMENT SOLN
Status: DC
  Administered 2019-05-30 – 2019-06-05 (×8): via INTRAVENOUS_CENTRAL

## 2019-05-30 MED ORDER — LIDOCAINE VISCOUS HCL 2 % MT SOLN
OROMUCOSAL | Status: AC
  Administered 2019-05-30: 3 mL via OROMUCOSAL
  Filled 2019-05-30: qty 15

## 2019-05-30 MED ORDER — SODIUM CHLORIDE 0.9% IV SOLUTION
Freq: Once | INTRAVENOUS | Status: AC
  Administered 2019-05-30: via INTRAVENOUS

## 2019-05-30 MED ORDER — ALBUMIN HUMAN 5 % IV SOLN
12.5000 g | Freq: Once | INTRAVENOUS | Status: AC
  Administered 2019-05-30: 12.5 g via INTRAVENOUS
  Filled 2019-05-30: qty 250

## 2019-05-30 MED ORDER — METHYLPREDNISOLONE SODIUM SUCC 125 MG IJ SOLR
125.0000 mg | Freq: Four times a day (QID) | INTRAMUSCULAR | Status: AC
  Administered 2019-05-30 – 2019-05-31 (×4): 125 mg via INTRAVENOUS
  Filled 2019-05-30 (×4): qty 2

## 2019-05-30 MED ORDER — PRISMASOL BGK 4/2.5 32-4-2.5 MEQ/L IV SOLN
INTRAVENOUS | Status: DC
  Administered 2019-05-30 – 2019-06-06 (×45): via INTRAVENOUS_CENTRAL

## 2019-05-30 MED ORDER — CHLORHEXIDINE GLUCONATE 0.12% ORAL RINSE (MEDLINE KIT)
15.0000 mL | Freq: Two times a day (BID) | OROMUCOSAL | Status: DC
  Administered 2019-05-30 – 2019-06-08 (×17): 15 mL via OROMUCOSAL

## 2019-05-30 MED ORDER — PRISMASOL BGK 4/2.5 32-4-2.5 MEQ/L REPLACEMENT SOLN
Status: DC
  Administered 2019-05-30 – 2019-06-04 (×9): via INTRAVENOUS_CENTRAL

## 2019-05-30 MED ORDER — ORAL CARE MOUTH RINSE
15.0000 mL | OROMUCOSAL | Status: DC
  Administered 2019-05-30 – 2019-06-08 (×77): 15 mL via OROMUCOSAL

## 2019-05-30 MED FILL — Medication: Qty: 1 | Status: AC

## 2019-05-30 NOTE — Progress Notes (Addendum)
Pharmacy CONSULT NOTE  Pharmacy Consult for heparin Indication for heparin: Impella 5.0 & ECMO  No Known Allergies  Patient Measurements: Height: 5\' 7"  (170.2 cm)(measured x3) Weight: 158 lb 11.7 oz (72 kg) IBW/kg (Calculated) : 66.1  Vital Signs: Temp: 99.1 F (37.3 C) (11/05 1700) Temp Source: Core (11/05 1600) BP: 75/59 (11/05 1527) Pulse Rate: 71 (11/05 1600)  Labs: Recent Labs    06/21/2019 1109  06/21/2019 2209  05/30/19 0305  05/30/19 0550  05/30/19 0903  05/30/19 0941 05/30/19 1033 05/30/19 1112 05/30/19 1239 05/30/19 1400 05/30/19 1626  HGB  --    < >  --    < > 9.2*   < > 9.0*   < >  --    < > 8.0* 7.8*  --  7.9*  --  7.5*  HCT  --    < >  --    < > 26.7*   < > 26.4*   < >  --    < > 23.5* 23.0*  --  22.9*  --  21.6*  PLT  --    < >  --    < > 77*  --  97*  --   --   --  76*  --   --  73*  --  76*  APTT  --    < >  --    < > 127*  --  >200*  --  >200*  --   --   --   --  89* 78* 48*  LABPROT  --    < >  --    < > 17.4*  --  16.7*  --   --   --   --   --   --   --   --  17.5*  INR  --    < >  --    < > 1.5*  --  1.4*  --   --   --   --   --   --   --   --  1.5*  HEPARINUNFRC 0.13*  --  0.45  --   --   --   --   --  0.79*  --   --   --   --   --   --   --   CREATININE  --    < >  --    < > 1.98*  --  1.93*  --   --   --   --   --  1.82*  --   --  1.69*   < > = values in this interval not displayed.    Estimated Creatinine Clearance: 38.6 mL/min (A) (by C-G formula based on SCr of 1.69 mg/dL (H)).  Assessment: 77 yoM admitted with shock and concern for ACS now s/p Impella CP placement in cath lab. Impella CP swapped out for 5.0 d/t hemolysis. Underwent [facility] ECMO with goal of CABG for revascularization.   ECMO initiate about 1800 Wed- originally started on heparin at 1500 units/hr systemically, which was held for bleeding. Heparin was restarted on 11/5 at 500 units/hr. He received 3 bolus doses of 2000 units for low ACT.   Currently impella purge flow is 14.1 mL/hr  (705 units/hr) - aPTT now below goal at 48  Not following Hep Lvl as not correlating with aPTT  Goal of Therapy:  APTT 50 - 70 s Monitor platelets by anticoagulation protocol: Yes   Plan:  Continue  heparin purge Add IV heparin 100 units/hr - discussed with Dr [name] Repeat aPTT in 4 hours  Barth Kirks, PharmD, BCPS, BCCCP Clinical Pharmacist [phone]  Please check AMION for all [place] numbers  05/30/2019 6:35 PM  Addendum:  Increased bleed on 100 units/hr heparin IV - stopped per Dr [name] Per RN - no more heparin overnight as long as ECMO flow rates are appropriate (which they are currently ~ 3.3).   Will add on aPTT for am labs

## 2019-05-30 NOTE — Progress Notes (Signed)
Verbal order from Dr Aundra Dubin to increase patient removal rate on CRRT to 50 at 1700.

## 2019-05-30 NOTE — Progress Notes (Signed)
Nutrition Follow-up  DOCUMENTATION CODES:   Not applicable  INTERVENTION:   Tube Feeding:  Initiate Vital 1.5 at 20 ml/hr, goal rate of 50 ml/hr Pro-Stat 30 mL QID Initiate TF at rate of 20 ml/hr, titrate by 10 mL q 12 hours until goal rate of 50 ml/hr Provides 2200 kcals, 141 g of protein and 917 mL of free water  Add B-complex with C due to losses with CRRT  NUTRITION DIAGNOSIS:   Inadequate oral intake related to inability to eat as evidenced by NPO status.  Being addressed via TF   GOAL:   Patient will meet greater than or equal to 90% of their needs  Progressing  MONITOR:   Vent status, Labs, Weight trends, Skin, I & O's  REASON FOR ASSESSMENT:   Ventilator    ASSESSMENT:   68 year old male who presented to the ED on 10/22 with chest pain and SOB. PMH of CAD, HTN, MI in 1998. Pt admitted with acute hypoxic respiratory failure due to acute pulmonary edema, PNA, possible ACS/NSTEMI. Pt required intubation.  10/23- s/p right/left heart cath 10/24- impella changed from femoral to R axillary 10/28- s/p bronchoscopy  10/29- vas cath, start CRRT  11/01- extubated 11/03- cardiac arrest, re-intubation  11/04- OR for Cannulation for ECMO, partial sternotomy, TEE; ECMO initiated  Pt remains on CRRT, on ECMO Noted tentative plan for CABG next week Patient is currently intubated on ventilator support; fentanyl and versed for sedation, paralyzed on nimbex, levophed weaned off MV: 7.1 L/min Temp (24hrs), Avg:98.3 F (36.8 C), Min:96.8 F (36 C), Max:99.1 F (37.3 C)   OG tube in stomach per chest xray Noted plan for post-pyloric feeding tube placement today. BS hypoactive, abdomen obese/taut, +BM yesterday If unable to obtain post-pyloric tube placement today, literature supports the initiation of gastric feedings in this patient population  Current weight 72 kg but with significant edema on exam. Noted weight of 70.5 kg on admission. Net +11.5 L since admission,  minimal UOP, unsure of actual EDW  Labs: potassium 4.8 (wdl), phosphorus 5.1 Meds: colace, solumedrol, reglan q 8 hours  NUTRITION - FOCUSED PHYSICAL EXAM:    Most Recent Value  Orbital Region  Mild depletion  Upper Arm Region  No depletion  Thoracic and Lumbar Region  Unable to assess  Buccal Region  Unable to assess  Temple Region  Moderate depletion  Clavicle Bone Region  Mild depletion  Clavicle and Acromion Bone Region  Mild depletion  Scapular Bone Region  Unable to assess  Dorsal Hand  No depletion  Patellar Region  No depletion  Anterior Thigh Region  No depletion  Posterior Calf Region  No depletion  Edema (RD Assessment)  Moderate  Hair  Reviewed  Eyes  Unable to assess  Mouth  Unable to assess  Skin  Reviewed  Nails  Reviewed       Diet Order:   Diet Order            Diet NPO time specified  Diet effective now              EDUCATION NEEDS:   No education needs have been identified at this time  Skin:  Skin Assessment: Skin Integrity Issues: Skin Integrity Issues:: Incisions Incisions: R chest/R groin  Last BM:  11/4  Height:   Ht Readings from Last 1 Encounters:  06/17/2019 5\' 7"  (1.702 m)    Weight:   Wt Readings from Last 1 Encounters:  05/28/19 72 kg    Ideal Body  Weight:  67.3 kg  BMI:  Body mass index is 24.86 kg/m.  Estimated Nutritional Needs:   Kcal:  2115 kcals  Protein:  140-155 g  Fluid:  1.7 L (goal to provide the least amount of fluid from nutrition prescrition as possbile at this time)    Kerman Passey MS, RDN, LDN, CNSC [phone] Pager  [phone] Weekend/On-Call Pager

## 2019-05-30 NOTE — Progress Notes (Signed)
  Impella viewed under echo, appeared to be in deep.  We withdrew the catheter to about 3.6 cm.    Loralie Champagne 05/30/2019 9:46 AM

## 2019-05-30 NOTE — Progress Notes (Signed)
  Echocardiogram 2D Echocardiogram has been performed.  Bradyn Soward G Natash Berman 05/30/2019, 9:25 AM

## 2019-05-30 NOTE — Progress Notes (Signed)
PT Cancellation Note  Patient Details Name: Raymond Chavez MRN: [id number] DOB: 15-Aug-1950   Cancelled Treatment:    Reason Eval/Treat Not Completed: Medical issues which prohibited therapy. Pt now on ECMO and sedated, unable to follow commands at this time. PT will continue to check in with patient on Monday if more medically appropriate to participate in skilled PT intervention.  Zenaida Niece, PT, DPT Acute Rehabilitation Pager: [phone]    Zenaida Niece 05/30/2019, 9:52 AM

## 2019-05-30 NOTE — Progress Notes (Addendum)
Patient ID: Raymond Chavez, male   DOB: September 15, 1950, 68 y.o.   MRN: [id number]    Advanced Heart Failure Rounding Note   Subjective:    Underwent placement of Impella 5.0 on 10/24 with removal of R femoral Impella CP.   Extubated 11/1, CVVH held on 11/3 given CVP 6-7 to see if renal function would recover.  He had a possible aspiration event afternoon 11/3 then became progressively hypoxemic during the night.  He had to be re-intubated.  Bronchoscopy showed copious blood return suggesting hemoptysis.  He was suctioned and given epinephrine down the tube and bleeding appeared to stop.  He developed PEA arrest during this time and underwent CPR with boluses of epinephrine and HCO3, total CPR time was 10-15 minutes. CVVH restarted.   Decision made 11/4 to initiate ECMO.  Patient went to OR, arterial cannula in ascending aorta and venous cannula in right femoral vein. Impella remains in place at P2.   This morning, MAP 70s on norepinephrine 2.  Lactate 1.0.   ECMO numbers: 3000 rpm 3.4 L/min flow  SvO2 74%  CVVH ongoing, running even currently.  After ECMO initiated, patient has had about 150 cc urine.   Enterococcus faecalis on BAL culture.  Patient is now on vancomycin/cefepime. CXR with upper lobe infiltrates. WBCs down to 15.6, afebrile.   Platelets 97 today. HIT negative.    Swan: CVP 6 PA 19/11 Cannot obtain thermodilution CO  Sedated on vent.   Impella P-2 No flows due to damage of sensor during placement. Motor current stable. LDH 881 => 911 => 593 => 589 => 718 => 744 => 512.   RHC/LHC (10/23):  Coronary Findings  Diagnostic Dominance: Right Left Main  75% distal left main stenosis.  Left Anterior Descending  Occluded proximally, some late filling by collaterals.  Ramus Intermedius  Large vessel, patent with luminal irregularities.  Left Circumflex  80% mid LCx stenosis. Small OM1 with 95% proximal stenosis. Large OM2 is subtotally occluded (fills late with TIMI 2  flow).  Right Coronary Artery  80% proximal stenosis. Serial 50% mid-vessel stenoses. PDA with only luminal irregularities. Large PLV with 80% mid-vessel stenosis.  Intervention  No interventions have been documented. Right Heart  Right Heart Pressures RHC Procedural Findings (mmHg): Hemodynamics RA mean 13 RV 70/17 PA 73/41, mean 53 PCWP mean 30 LV 140/40 AO 138/88  Oxygen saturations: PA 60% AO 97%  Cardiac Output (Fick) 2.95  Cardiac Index (Fick) 1.62 PVR 7.8 WU  Cardiac Output (Thermo) 3.24 Cardiac Index (Thermo) 1.78  PVR 7.1 WU  PAPI 2.46    Objective:   Weight Range:  Vital Signs:   Temp:  [95.7 F (35.4 C)-99.1 F (37.3 C)] 98.8 F (37.1 C) (11/05 0700) Pulse Rate:  [70-91] 70 (11/05 0747) Resp:  [10-40] 18 (11/05 0747) BP: (82-116)/(58-84) 84/62 (11/05 0747) SpO2:  [95 %-100 %] 100 % (11/05 0700) Arterial Line BP: (80-123)/(62-79) 93/68 (11/05 0700) FiO2 (%):  [30 %-80 %] 30 % (11/05 0749) Last BM Date: 06/10/2019  Weight change: Filed Weights   05/26/19 0115 05/27/19 0329 05/28/19 0500  Weight: 71.8 kg 73 kg 72 kg    Intake/Output:   Intake/Output Summary (Last 24 hours) at 05/30/2019 0806 Last data filed at 05/30/2019 0800 Gross per 24 hour  Intake 7200.97 ml  Output 4421 ml  Net 2779.97 ml     Physical Exam: CVP 6 General: Intubated/sedated. Arterial ECMO catheter upper chest, venous in right groin.  Neck: No JVD, no thyromegaly or thyroid  nodule.  Lungs: Decreased BS at bases.  CV: Nondisplaced PMI.  Heart regular S1/S2, no S3/S4, no murmur. 1+ edema 1/2 to knees.   Abdomen: Soft, nontender, no hepatosplenomegaly, no distention.  Skin: Intact without lesions or rashes.  Neurologic: Sedated Extremities: No clubbing or cyanosis.  HEENT: Normal.   Telemetry: NSR 90s no PVCs (personally reviewed)  Labs: Basic Metabolic Panel: Recent Labs  Lab 05/26/19 0250  05/27/19 0309  05/27/19 1414 05/28/19 0342  05/28/19 1620  05/28/2019  0211  06/23/2019 0416 05/26/2019 1649  06/17/2019 1920  06/09/2019 2308  05/30/19 0305 05/30/19 0306 05/30/19 0419 05/30/19 0550 05/30/19 0552 05/30/19 0656  NA 136   < > 135   < > 135 136   < > 136   < > 139   < > 141 138   < > 140   < > 139   < > 139 139 139 139 139 139  K 4.0   < > 4.4   < > 4.4 4.1   < > 4.1   < > 4.8   < > 4.5 5.2*   < > 5.7*   < > 5.5*   < > 5.0 5.0 5.2* 5.2* 5.2* 5.2*  CL 104   < > 101  --  100 99  --  100  --  96*  --  100 102  --  105  --  106  --  105  --   --  106  --   --   CO2 24   < > 21*  --  21* 24  --  23  --  23  --  23  --   --  22  --  23  --  25  --   --  25  --   --   GLUCOSE 119*   < > 124*  --  135* 134*  --  140*  --  138*  --  145* 139*  --  160*  --  151*  --  110*  --   --  113*  --   --   BUN 33*   < > 36*  --  32* 30*  --  52*  --  74*  --  79* 56*  --  67*  --  54*  --  45*  --   --  42*  --   --   CREATININE 1.45*   < > 1.39*  --  1.31* 1.36*  --  2.16*  --  3.13*  --  3.01* 2.40*  --  2.68*  --  2.17*  --  1.98*  --   --  1.93*  --   --   CALCIUM 7.7*   < > 7.8*  --  7.7* 8.0*  --  7.6*  --  6.7*  --  6.9*  --   --  6.2*  --  6.2*  --  6.9*  --   --  6.9*  --   --   MG 2.4  --  2.5*  --   --  2.6*  --   --   --  2.4  --   --   --   --   --   --   --   --  2.4  --   --   --   --   --   PHOS 1.5*   < > 1.8*  --  3.8 2.1*  2.1*  --  4.5  --   --   --  7.1*  --   --  6.0*  --   --   --   --   --   --   --   --   --    < > = values in this interval not displayed.    Liver Function Tests: Recent Labs  Lab 06/20/2019 0211 06/22/2019 0416 05/27/2019 1920 06/09/2019 2308 05/30/19 0305 05/30/19 0550  AST 100*  --   --  41 34 31  ALT 60*  --   --  40 38 38  ALKPHOS 50  --   --  39 43 44  BILITOT 1.5*  --   --  1.9* 1.6* 1.7*  PROT 3.4*  --   --  3.7* 4.4* 4.4*  ALBUMIN 1.4* 1.9* 1.5* 2.0* 2.4* 2.4*   No results for input(s): LIPASE, AMYLASE in the last 168 hours. No results for input(s): AMMONIA in the last 168 hours.  CBC: Recent Labs  Lab  05/27/19 1651 05/28/19 0329  06/04/2019 0211  06/20/2019 1647  06/13/2019 1920  06/19/2019 2308  05/30/19 0305 05/30/19 0306 05/30/19 0419 05/30/19 0550 05/30/19 0552 05/30/19 0656  WBC 26.8* 29.2*   < > 30.1*  --  29.7*  --  29.7*  --  19.3*  --  15.0*  --   --  15.6*  --   --   NEUTROABS 20.5* 22.8*  --  22.3*  --   --   --   --   --  15.6*  --   --   --   --  12.9*  --   --   HGB 7.7* 8.7*   < > 5.9*   < > 9.9*   < > 8.2*   < > 11.0*   < > 9.2* 8.5* 8.2* 9.0* 8.8* 8.2*  HCT 22.8* 26.8*   < > 18.0*   < > 29.0*   < > 24.0*   < > 31.7*   < > 26.7* 25.0* 24.0* 26.4* 26.0* 24.0*  MCV 89.1 90.8   < > 91.4  --  88.7  --  88.9  --  88.5  --  89.6  --   --  90.1  --   --   PLT 95* 93*   < > 54*  --  47*  --  52*  --  46*  --  77*  --   --  97*  --   --    < > = values in this interval not displayed.    Cardiac Enzymes: No results for input(s): CKTOTAL, CKMB, CKMBINDEX, TROPONINI in the last 168 hours.  BNP: BNP (last 3 results) Recent Labs    05/07/2019 1739 05/22/2019 1234  BNP 2,725.3* 3,370.0*    ProBNP (last 3 results) No results for input(s): PROBNP in the last 8760 hours.    Other results:  Imaging: Dg Abd 1 View  Result Date: 05/30/2019 CLINICAL DATA:  OG tube placement EXAM: ABDOMEN - 1 VIEW COMPARISON:  May 28, 2019 FINDINGS: The tip of the enteric tube appears to terminate near the GE junction. Exact positioning is difficult to determine secondary to overlapping wires. The stomach is distended. The bowel gas pattern is otherwise nonspecific. An Impella is noted projecting over the left ventricle. IMPRESSION: 1. Enteric tube tip appears to terminate near the GE junction. Repositioning should be considered. 2. Gaseous distention of the stomach. Electronically Signed   By: Constance Holster  M.D.   On: 05/31/2019 06:17   Dg Chest Port 1 View  Result Date: 05/30/2019 CLINICAL DATA:  Endotracheal tube placement EXAM: PORTABLE CHEST 1 VIEW COMPARISON:  May 29, 2019 FINDINGS:  The Impella device is stable in positioning. The Swan-Ganz catheter tip projects over the main pulmonary artery. The enteric tube appears to extend below the left hemidiaphragm. Left-sided central venous catheter is stable in positioning. The endotracheal tube terminates just above the carina. The heart size is unchanged. The patient is status post prior median sternotomy. Upper lobe predominant hazy airspace opacities are again noted. There are bilateral pleural effusions. The right-sided PICC line appears stable in positioning. IMPRESSION: 1. Lines and tubes as above. The endotracheal tube terminates just above the carina. Repositioning should be considered. 2. Persistent upper lobe predominant hazy airspace opacities favored to represent pulmonary edema. Electronically Signed   By: Constance Holster M.D.   On: 05/30/2019 07:00   Dg Chest Port 1 View  Result Date: 05/26/2019 CLINICAL DATA:  Post ECMO.  Hypertension. EXAM: PORTABLE CHEST 1 VIEW COMPARISON:  05/31/2019 FINDINGS: Previously seen support devices are stable. Presumed ECMO device now projects over the mediastinum. Bilateral airspace opacities are again noted, most pronounced in the upper lobes bilaterally. These are stable since prior study. No pneumothorax. No effusions. IMPRESSION: No significant change in the appearance of the lungs since prior study. Electronically Signed   By: Rolm Baptise M.D.   On: 06/12/2019 20:42   Dg Chest Port 1 View  Result Date: 06/02/2019 CLINICAL DATA:  Intubation, check support apparatus EXAM: PORTABLE CHEST 1 VIEW COMPARISON:  05/30/2019, 2 a.m. FINDINGS: No significant change in AP portable examination. Extensive support apparatus includes endotracheal tube, esophagogastric tube, left neck multi lumen vascular catheter, right neck pulmonary arterial catheter, right upper extremity approach Impella device, and right upper extremity PICC, all in unchanged position. Unchanged diffuse bilateral interstitial and  heterogeneous pulmonary opacity. Defibrillator pad applied about the chest. IMPRESSION: 1. No significant change in AP portable examination. Extensive support apparatus includes endotracheal tube, esophagogastric tube, left neck multi lumen vascular catheter, right neck pulmonary arterial catheter, right upper extremity approach Impella device, and right upper extremity PICC, all in unchanged position. 2. Unchanged diffuse bilateral interstitial and heterogeneous pulmonary opacity, consistent with edema, infection, and/or ARDS. Electronically Signed   By: Eddie Candle M.D.   On: 05/28/2019 14:42   Dg Chest Port 1 View  Result Date: 06/16/2019 CLINICAL DATA:  ET tube placement.  Post CPR. EXAM: PORTABLE CHEST 1 VIEW COMPARISON:  05/28/2019 FINDINGS: Endotracheal tube is 3 cm above the carina. Left dialysis catheter remains in place, unchanged. Impella device remains within the heart, unchanged. Swan-Ganz catheter is in the central right pulmonary artery, also stable. Heart is normal size. Bilateral upper lobe airspace opacities again noted, increasing slightly since prior study. No effusions. No acute bony abnormality. IMPRESSION: Bilateral upper lobe airspace opacities have worsened slightly since prior study. Endotracheal tube 3 cm above the carina. Electronically Signed   By: Rolm Baptise M.D.   On: 06/12/2019 02:15   Dg Chest Port 1 View  Result Date: 05/28/2019 CLINICAL DATA:  Shortness of breath.  CHF.  Impella device. EXAM: PORTABLE CHEST 1 VIEW COMPARISON:  05/27/2019. FINDINGS: Impella device, Swan-Ganz catheter, left IJ line stable position, right PICC line. Stable cardiomegaly. Diffuse bilateral pulmonary infiltrates/edema again noted. Similar findings noted on prior exam. No pleural effusion or pneumothorax. IMPRESSION: 1.  Lines in stable position.  Impella device in stable position. 2.  Stable cardiomegaly. Diffuse bilateral pulmonary infiltrates/edema unchanged. Electronically Signed   By:  Marcello Moores  Register   On: 05/28/2019 08:15   Dg Abd Portable 1v  Result Date: 05/28/2019 CLINICAL DATA:  Multiple instances of vomiting. EXAM: PORTABLE ABDOMEN - 1 VIEW COMPARISON:  Chest x-ray 05/28/2019. FINDINGS: Slightly prominent air-filled loops of small and large bowel noted. Adynamic ileus could present in this fashion. Follow-up exam suggested demonstrate resolution and to exclude bowel obstruction. No free air. Degenerative changes lumbar spine and both hips. Aortoiliac atherosclerotic vascular disease. IMPRESSION: Slightly prominent air-filled loops of small large bowel noted. Adynamic ileus could present in this fashion. Follow-up exam suggested to demonstrate resolution and to exclude bowel obstruction. Electronically Signed   By: Marcello Moores  Register   On: 05/28/2019 14:08     Medications:     Scheduled Medications: . sodium chloride   Intravenous Once  . sodium chloride   Intravenous Once  . sodium chloride   Intravenous Once  . acetaminophen  1,000 mg Oral Q6H   Or  . acetaminophen (TYLENOL) oral liquid 160 mg/5 mL  1,000 mg Per Tube Q6H  . acetaminophen (TYLENOL) oral liquid 160 mg/5 mL  650 mg Per Tube Once   Or  . acetaminophen  650 mg Rectal Once  . arformoterol  15 mcg Nebulization BID  . artificial tears  1 application Both Eyes [id number]  . aspirin  81 mg Per Tube Daily  . atorvastatin  80 mg Per Tube q1800  . bisacodyl  10 mg Oral Daily   Or  . bisacodyl  10 mg Rectal Daily  . chlorhexidine  15 mL Mouth Rinse BID  . Chlorhexidine Gluconate Cloth  6 each Topical Daily  . docusate sodium  200 mg Oral Daily  . fentaNYL (SUBLIMAZE) injection  25 mcg Intravenous Once  . insulin aspart  1-3 Units Subcutaneous Q4H  . ipratropium  0.5 mg Nebulization BID  . mouth rinse  15 mL Mouth Rinse q12n4p  . methylPREDNISolone (SOLU-MEDROL) injection  40 mg Intravenous Q12H  . metoCLOPramide (REGLAN) injection  5 mg Intravenous Q8H  . metoprolol tartrate  12.5 mg Oral BID   Or  .  metoprolol tartrate  12.5 mg Per Tube BID  . [START ON 06/01/2019] pantoprazole  40 mg Intravenous Q12H  . senna-docusate  1 tablet Oral BID  . sodium chloride flush  10-40 mL Intracatheter Q12H  . sodium chloride flush  3 mL Intravenous Q12H  . sodium chloride flush  3 mL Intravenous Q12H    Infusions: . sodium chloride 10 mL/hr at 05/30/19 0800  . sodium chloride Stopped (05/28/19 0127)  . sodium chloride    . sodium chloride    . sodium chloride 1 mL/hr at 06/13/2019 1900  . albumin human    . albumin human    . cisatracurium (NIMBEX) infusion 5 mcg/kg/min (05/30/19 0800)  . DOPamine Stopped (06/10/2019 0934)  . EPINEPHrine 4 mg in dextrose 5% 250 mL infusion (16 mcg/mL)    . fentaNYL infusion INTRAVENOUS 250 mcg/hr (05/30/19 0800)  . impella catheter heparin 50 unit/mL in dextrose 5% 50,000 Units (05/23/19 0815)  . heparin 500 Units/hr (05/30/19 0800)  . lactated ringers    . magnesium sulfate    . meropenem (MERREM) IV Stopped (05/30/19 0103)  . midazolam 5 mg/hr (05/30/19 0800)  . nitroGLYCERIN    . norepinephrine (LEVOPHED) Adult infusion Stopped (05/30/19 0754)  . pantoprozole (PROTONIX) infusion 8 mg/hr (05/30/19 0800)  . prismasol BGK 0/2.5    .  prismasol BGK 0/2.5    . prismasol BGK 4/2.5 2,000 mL/hr at 05/26/2019 2353  . vancomycin      PRN Medications: sodium chloride, Place/Maintain arterial line **AND** sodium chloride, sodium chloride, albumin human, docusate, fentaNYL, heparin, influenza vaccine adjuvanted, ipratropium-albuterol, midazolam, morphine injection, ondansetron (ZOFRAN) IV, pneumococcal 23 valent vaccine, sodium chloride, sodium chloride flush, sodium chloride flush, sodium chloride flush   Assessment/Plan:   1. Shock: Possible mixed cardiogenic/septic with fever and suspected PNA.  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  Echo with EF 20%, RV moderately HK.  Impella CP placed 10/23. Switched for Impella 5.0 on 10/24. No  flow meter on Impella due to damage to sensor on insertion. Patient has LBBB.  PEA arrest 11/3, ECMO started 11/4.  This morning, ECMO at 3000 rpm with flow 3.4 L/min.  Impella is at P2.  MAP 70s, SvO2 74%.  Currently NE 2, running CVVH even for now. Making some urine.  - Continue ECMO at current speed.  Heparin gtt ongoing, goal PTT 50-70 keep on lower end with bleeding.  - Can wean off norepinephrine.  - Run CVVH even for now now, will begin to pull gently this afternoon or evening due to anasarca.  - Continue Impella at P2 for now, will assess position under echo today.  LDH lower.  2. CAD:  NSTEMI, hs-TnI 16,000. 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  - continue ASA/statin. No b-blocker with shock - Goal is eventual CABG, discussed with Dr. [name], ?Monday or Tuesday.   3. AKI: Baseline creatinine 1.3, now AKI likely due to ATN from shock and contrast (had CTA for PE at admission, only 25 cc contrast with cath).  CVVH ongoing, some UOP since ECMO begun.  - Continue CVVH even for the time being, later today pull fluid gently.   4. Acute Respiratory Failure: Has had PNA with Enterococcus faecalis in BAL cultures. Now re-intubated.  He had PEA arrest with possible aspiration and also had pulmonary hemorrhage.  Heparin was stopped systemically and patient had 3 units PRBCs. CXR with bilateral upper lobe airspace opacities.  Hemoptysis appears to have stopped, patient is back on heparin gtt with ECMO.  - CVVH restarted as above.  - Stop ampicillin and start vancomycin/meropenem.  5. ID: Suspected PNA, CXR with left base density. Enterococcus faecalis in BAL cultures.  Now afebrile and WBCs coming down.  - As above, with possible aspiration/pulmonary hemorrhage/PEA arrest, started vancomycin/meropenem.    6. LBBB: Unsure chronicity.  7. Anemia: Hgb up to 9 after transfusions last night.  8. Thrombocytopenia: HIT negative  ?Low due to sepsis versus hemolysis.    9.  PVCs/NSVT: Quiescent, amiodarone was stopped.  10. FEN: Place Cortrak, begin post-pyloric TFs.   CRITICAL CARE Performed by: Loralie Champagne  Total critical care time: 45 minutes  Critical care time was exclusive of separately billable procedures and treating other patients.  Critical care was necessary to treat or prevent imminent or life-threatening deterioration.  Critical care was time spent personally by me on the following activities: development of treatment plan with patient and/or surrogate as well as nursing, discussions with consultants, evaluation of patient's response to treatment, examination of patient, obtaining history from patient or surrogate, ordering and performing treatments and interventions, ordering and review of laboratory studies, ordering and review of radiographic studies, pulse oximetry and re-evaluation of patient's condition.  Loralie Champagne 05/30/2019 8:06 AM

## 2019-05-30 NOTE — Progress Notes (Signed)
Dr Aundra Dubin made aware of CBC results. No new orders at this time

## 2019-05-30 NOTE — Progress Notes (Signed)
1 Day Post-Op Procedure(s) (LRB): CANNULATION FOR ECMO (EXTRACORPOREAL MEMBRANE OXYGENATION) (N/A) TRANSESOPHAGEAL ECHOCARDIOGRAM (TEE) (N/A) Subjective: Intubated/sedated  Objective: Vital signs in last 24 hours: Temp:  [96.8 F (36 C)-99.1 F (37.3 C)] 99.1 F (37.3 C) (11/05 1700) Pulse Rate:  [70-82] 71 (11/05 1600) Cardiac Rhythm: Normal sinus rhythm (11/05 1600) Resp:  [10-22] 10 (11/05 1700) BP: (75-116)/(59-75) 75/59 (11/05 1527) SpO2:  [100 %] 100 % (11/05 1700) Arterial Line BP: (79-123)/(59-79) 89/79 (11/05 1700) FiO2 (%):  [30 %] 30 % (11/05 1600)  Hemodynamic parameters for last 24 hours: PAP: (11-32)/(7-20) 22/19 CVP:  [5 mmHg-9 mmHg] 9 mmHg  Intake/Output from previous day: 11/04 0701 - 11/05 0700 In: 7510.1 [I.V.:2937.5; Blood:3508.7; IV Piggyback:804.9] Out: 4399 [Urine:100; Chest Tube:1565] Intake/Output this shift: Total I/O In: 1363 [I.V.:704.7; Other:141; IV Piggyback:517.3] Out: 7654 [Urine:30; YTKPT:4656; Chest Tube:220]  General appearance: intubated/sedated; anasarca Neurologic: unable to fully assess Heart: regular rate and rhythm, S1, S2 normal, no murmur, click, rub or gallop Lungs: clear to auscultation bilaterally Abdomen: soft, non-tender; bowel sounds normal; no masses,  no organomegaly Extremities: edema 3+ Wound: dressed, dry  Lab Results: Recent Labs    05/30/19 1239 05/30/19 1626  WBC 13.7* 16.0*  HGB 7.9* 7.5*  HCT 22.9* 21.6*  PLT 73* 76*   BMET:  Recent Labs    05/30/19 0550  05/30/19 1033 05/30/19 1112  NA 139   < > 137 137  K 5.2*   < > 4.8 4.7  CL 106  --   --  106  CO2 25  --   --  23  GLUCOSE 113*  --   --  145*  BUN 42*  --   --  38*  CREATININE 1.93*  --   --  1.82*  CALCIUM 6.9*  --   --  6.8*   < > = values in this interval not displayed.    PT/INR:  Recent Labs    05/30/19 0550  LABPROT 16.7*  INR 1.4*   ABG    Component Value Date/Time   PHART 7.500 (H) 05/30/2019 1033   HCO3 29.5 (H)  05/30/2019 1033   TCO2 31 05/30/2019 1033   ACIDBASEDEF 1.0 06/15/2019 0255   O2SAT 100.0 05/30/2019 1033   CBG (last 3)  Recent Labs    05/30/19 0434 05/30/19 0756 05/30/19 1155  GLUCAP 84 89 96    Assessment/Plan: S/P Procedure(s) (LRB): CANNULATION FOR ECMO (EXTRACORPOREAL MEMBRANE OXYGENATION) (N/A) TRANSESOPHAGEAL ECHOCARDIOGRAM (TEE) (N/A) antibiotics for bilateral upper lobe infiltrate  Start to remove fluid via CVVHD.  Ok to awaken as long as he is not fighting ventilator   LOS: 14 days    Wonda Olds 05/30/2019

## 2019-05-30 NOTE — Progress Notes (Addendum)
Pharmacy CONSULT NOTE  Pharmacy Consult for heparin Indication for heparin: Impella 5.0 & ECMO  No Known Allergies  Patient Measurements: Height: 5\' 7"  (170.2 cm)(measured x3) Weight: 158 lb 11.7 oz (72 kg) IBW/kg (Calculated) : 66.1  Vital Signs: Temp: 98.4 F (36.9 C) (11/05 0830) Temp Source: Core (11/05 0800) BP: 84/62 (11/05 0747) Pulse Rate: 70 (11/05 0800)  Labs: Recent Labs    06/02/2019 1109  05/27/2019 2209 05/30/2019 2308  05/30/19 0305  05/30/19 0550 05/30/19 0552 05/30/19 0656 05/30/19 0816 05/30/19 0903  HGB  --    < >  --  11.0*   < > 9.2*   < > 9.0* 8.8* 8.2* 8.2*  --   HCT  --    < >  --  31.7*   < > 26.7*   < > 26.4* 26.0* 24.0* 24.0*  --   PLT  --    < >  --  46*  --  77*  --  97*  --   --   --   --   APTT  --    < >  --  107*  --  127*  --  >200*  --   --   --   --   LABPROT  --    < >  --  20.4*  --  17.4*  --  16.7*  --   --   --   --   INR  --    < >  --  1.8*  --  1.5*  --  1.4*  --   --   --   --   HEPARINUNFRC 0.13*  --  0.45  --   --   --   --   --   --   --   --  0.79*  CREATININE  --    < >  --  2.17*  --  1.98*  --  1.93*  --   --   --   --    < > = values in this interval not displayed.    Estimated Creatinine Clearance: 33.8 mL/min (A) (by C-G formula based on SCr of 1.93 mg/dL (H)).  Assessment: 30 yoM admitted with shock and concern for ACS now s/p Impella CP placement in cath lab. Impella CP swapped out for 5.0 d/t hemolysis. Underwent [facility] ECMO with goal of CABG for revascularization.   ECMO initiate about 1800- originally started on heparin at 1500 units/hr systemically, which was held for bleeding. Heparin was restarted on 11/5 at 500 units/hr. He received 3 bolus doses of 2000 units for low ACT.   Currently impella purge flow is 14.1 mL/hr (705 units/hr). APTT>200, heparin level 0.79 - both supratherapeutic. TEG result also showing prolonged time to start clot formation with R>17 (CKH 11). Hgb 8, plt 76. No s/sx of bleeding - no bleeding  from ET today.   Goal of Therapy:  Heparin level: 0.3-0.5  APTT 66-85 sec >> aiming for goal range 50-70s Monitor platelets by anticoagulation protocol: Yes   Plan:  Continue heparin purge solution  Will hold systemic heparin infusion for 2 hours, will get an aPTT and reassess Monitor q12h CBC, INR, Fibrinogen, CMP, Mg, and Lactate per protocol  Antonietta Jewel, PharmD, BCCCP Clinical Pharmacist  Phone: [phone]  Please check AMION for all [facility] phone numbers After 10:00 PM, call [facility] [phone]   ADDENDUM APTT after heparin off for 2 hours came back at 89, after 3 hours  it came back at 18. Still on heparin purge only, holding systemic heparin. ECHO running at 3000 rpm, 3.4 L/min flow, sweep gas 4. Will continue to hold systemic heparin and get aPTT in 4 hours.   Sherron Monday, PharmD, BCCCP Clinical Pharmacist

## 2019-05-30 NOTE — Progress Notes (Signed)
Dr [name] paged regarding chest tube output for the last two hours. Patient had 130 out at 1800 and 200 out at 1900. Verbal orders received to stop heparin and give 1 unit of platelets. Will continue to monitor closely.

## 2019-05-30 NOTE — Progress Notes (Signed)
[facility] NEPHROLOGY PROGRESS NOTE  Assessment/ Plan: Pt is a 68 y.o. yo male with cardiogenic shock who underwent placement of Impella on 10/24, on milrinone and Levophed, cardiac cath with three-vessel disease and echocardiogram revealing EF of 20%, respiratory failure, consulted for AKI.  Baseline creatinine level around 1.3.  #Acute kidney injury on CKD: due to cardiorenal syndrome and contrast nephropathy. CRRT since 10/30.  Patient had cardiac arrest on 11/3.  Resumed CRRT.  Starting ECMO since 06/23/2019.  Potassium mildly elevated therefore change pre and post filter fluid to 0K bath and continue dialysate PrismaSol to 4K bath.  UF as tolerated by BP.  Discussed with ICU nurse.  #Cardiac arrest/cardiogenic shock: EF 20%, has Impella and now on ECMO.  Had  cardiac arrest on 11/3.  Currently on dopamine, Levophed, epinephrine with borderline blood pressure.  Heart failure team is following.    #Acute respiratory failure with hypoxia: Due to CHF and pneumonia.  Pulmonary team is following.  On mechanical ventilation  #CAD: NSTEMI, CABG with severe three-vessel disease.  On aspirin statin.  No beta-blocker because of shock.  #Hypophosphatemia: Elevated phosphorus level noted today, expect to improve with CRRT.  Follow-up lab.  Discussed with ICU team.  Subjective: Seen and examined at bedside.  Patient has ECMO.  Sedated, intubated on multiple pressors. Objective Vital signs in last 24 hours: Vitals:   05/30/19 0747 05/30/19 0800 05/30/19 0815 05/30/19 0830  BP: (!) 84/62     Pulse: 70 70    Resp: 18 18 18 18   Temp:  98.6 F (37 C) 98.6 F (37 C) 98.4 F (36.9 C)  TempSrc:  Core    SpO2:  100% 100% 100%  Weight:      Height:       Weight change:   Intake/Output Summary (Last 24 hours) at 05/30/2019 0841 Last data filed at 05/30/2019 0800 Gross per 24 hour  Intake 7250.97 ml  Output 4528 ml  Net 2722.97 ml       Labs: Basic Metabolic Panel: Recent  Labs  Lab 05/28/19 1620  05/30/2019 0416  06/23/2019 1920  06/19/2019 2308  05/30/19 0305  05/30/19 0550 05/30/19 0552 05/30/19 0656 05/30/19 0816  NA 136   < > 141   < > 140   < > 139   < > 139   < > 139 139 139 138  K 4.1   < > 4.5   < > 5.7*   < > 5.5*   < > 5.0   < > 5.2* 5.2* 5.2* 5.2*  CL 100   < > 100   < > 105  --  106  --  105  --  106  --   --   --   CO2 23   < > 23  --  22  --  23  --  25  --  25  --   --   --   GLUCOSE 140*   < > 145*   < > 160*  --  151*  --  110*  --  113*  --   --   --   BUN 52*   < > 79*   < > 67*  --  54*  --  45*  --  42*  --   --   --   CREATININE 2.16*   < > 3.01*   < > 2.68*  --  2.17*  --  1.98*  --  1.93*  --   --   --  CALCIUM 7.6*   < > 6.9*  --  6.2*  --  6.2*  --  6.9*  --  6.9*  --   --   --   PHOS 4.5  --  7.1*  --  6.0*  --   --   --   --   --   --   --   --   --    < > = values in this interval not displayed.   Liver Function Tests: Recent Labs  Lab 06/15/2019 2308 05/30/19 0305 05/30/19 0550  AST 41 34 31  ALT 40 38 38  ALKPHOS 39 43 44  BILITOT 1.9* 1.6* 1.7*  PROT 3.7* 4.4* 4.4*  ALBUMIN 2.0* 2.4* 2.4*   No results for input(s): LIPASE, AMYLASE in the last 168 hours. No results for input(s): AMMONIA in the last 168 hours. CBC: Recent Labs  Lab 06/16/2019 0211  06/04/2019 1647  06/17/2019 1920  06/10/2019 2308  05/30/19 0305  05/30/19 0550 05/30/19 0552 05/30/19 0656 05/30/19 0816  WBC 30.1*  --  29.7*  --  29.7*  --  19.3*  --  15.0*  --  15.6*  --   --   --   NEUTROABS 22.3*  --   --   --   --   --  15.6*  --   --   --  12.9*  --   --   --   HGB 5.9*   < > 9.9*   < > 8.2*   < > 11.0*   < > 9.2*   < > 9.0* 8.8* 8.2* 8.2*  HCT 18.0*   < > 29.0*   < > 24.0*   < > 31.7*   < > 26.7*   < > 26.4* 26.0* 24.0* 24.0*  MCV 91.4  --  88.7  --  88.9  --  88.5  --  89.6  --  90.1  --   --   --   PLT 54*  --  47*  --  52*  --  46*  --  77*  --  97*  --   --   --    < > = values in this interval not displayed.   Cardiac Enzymes: No  results for input(s): CKTOTAL, CKMB, CKMBINDEX, TROPONINI in the last 168 hours. CBG: Recent Labs  Lab 06/17/2019 0828 05/30/2019 1202 06/11/2019 1946 05/30/19 0020 05/30/19 0434  GLUCAP 171* 152* 141* 114* 84    Iron Studies: No results for input(s): IRON, TIBC, TRANSFERRIN, FERRITIN in the last 72 hours. Studies/Results: Dg Abd 1 View  Result Date: 06/20/2019 CLINICAL DATA:  OG tube placement EXAM: ABDOMEN - 1 VIEW COMPARISON:  May 28, 2019 FINDINGS: The tip of the enteric tube appears to terminate near the GE junction. Exact positioning is difficult to determine secondary to overlapping wires. The stomach is distended. The bowel gas pattern is otherwise nonspecific. An Impella is noted projecting over the left ventricle. IMPRESSION: 1. Enteric tube tip appears to terminate near the GE junction. Repositioning should be considered. 2. Gaseous distention of the stomach. Electronically Signed   By: Katherine Mantle M.D.   On: 05/27/2019 06:17   Dg Chest Port 1 View  Result Date: 05/30/2019 CLINICAL DATA:  Endotracheal tube placement EXAM: PORTABLE CHEST 1 VIEW COMPARISON:  May 29, 2019 FINDINGS: The Impella device is stable in positioning. The Swan-Ganz catheter tip projects over the main pulmonary artery. The enteric tube appears to extend below the  left hemidiaphragm. Left-sided central venous catheter is stable in positioning. The endotracheal tube terminates just above the carina. The heart size is unchanged. The patient is status post prior median sternotomy. Upper lobe predominant hazy airspace opacities are again noted. There are bilateral pleural effusions. The right-sided PICC line appears stable in positioning. IMPRESSION: 1. Lines and tubes as above. The endotracheal tube terminates just above the carina. Repositioning should be considered. 2. Persistent upper lobe predominant hazy airspace opacities favored to represent pulmonary edema. Electronically Signed   By: Katherine Mantle M.D.   On: 05/30/2019 07:00   Dg Chest Port 1 View  Result Date: 06/01/2019 CLINICAL DATA:  Post ECMO.  Hypertension. EXAM: PORTABLE CHEST 1 VIEW COMPARISON:  06/05/2019 FINDINGS: Previously seen support devices are stable. Presumed ECMO device now projects over the mediastinum. Bilateral airspace opacities are again noted, most pronounced in the upper lobes bilaterally. These are stable since prior study. No pneumothorax. No effusions. IMPRESSION: No significant change in the appearance of the lungs since prior study. Electronically Signed   By: Charlett Nose M.D.   On: 06/09/2019 20:42   Dg Chest Port 1 View  Result Date: 06/24/2019 CLINICAL DATA:  Intubation, check support apparatus EXAM: PORTABLE CHEST 1 VIEW COMPARISON:  06/13/2019, 2 a.m. FINDINGS: No significant change in AP portable examination. Extensive support apparatus includes endotracheal tube, esophagogastric tube, left neck multi lumen vascular catheter, right neck pulmonary arterial catheter, right upper extremity approach Impella device, and right upper extremity PICC, all in unchanged position. Unchanged diffuse bilateral interstitial and heterogeneous pulmonary opacity. Defibrillator pad applied about the chest. IMPRESSION: 1. No significant change in AP portable examination. Extensive support apparatus includes endotracheal tube, esophagogastric tube, left neck multi lumen vascular catheter, right neck pulmonary arterial catheter, right upper extremity approach Impella device, and right upper extremity PICC, all in unchanged position. 2. Unchanged diffuse bilateral interstitial and heterogeneous pulmonary opacity, consistent with edema, infection, and/or ARDS. Electronically Signed   By: Lauralyn Primes M.D.   On: 05/28/2019 14:42   Dg Chest Port 1 View  Result Date: 05/26/2019 CLINICAL DATA:  ET tube placement.  Post CPR. EXAM: PORTABLE CHEST 1 VIEW COMPARISON:  05/28/2019 FINDINGS: Endotracheal tube is 3 cm above the carina. Left  dialysis catheter remains in place, unchanged. Impella device remains within the heart, unchanged. Swan-Ganz catheter is in the central right pulmonary artery, also stable. Heart is normal size. Bilateral upper lobe airspace opacities again noted, increasing slightly since prior study. No effusions. No acute bony abnormality. IMPRESSION: Bilateral upper lobe airspace opacities have worsened slightly since prior study. Endotracheal tube 3 cm above the carina. Electronically Signed   By: Charlett Nose M.D.   On: 06/01/2019 02:15   Dg Abd Portable 1v  Result Date: 05/28/2019 CLINICAL DATA:  Multiple instances of vomiting. EXAM: PORTABLE ABDOMEN - 1 VIEW COMPARISON:  Chest x-ray 05/28/2019. FINDINGS: Slightly prominent air-filled loops of small and large bowel noted. Adynamic ileus could present in this fashion. Follow-up exam suggested demonstrate resolution and to exclude bowel obstruction. No free air. Degenerative changes lumbar spine and both hips. Aortoiliac atherosclerotic vascular disease. IMPRESSION: Slightly prominent air-filled loops of small large bowel noted. Adynamic ileus could present in this fashion. Follow-up exam suggested to demonstrate resolution and to exclude bowel obstruction. Electronically Signed   By: Maisie Fus  Register   On: 05/28/2019 14:08    Medications: Infusions: . sodium chloride 10 mL/hr at 05/30/19 0800  . sodium chloride Stopped (05/28/19 0127)  . sodium chloride    .  sodium chloride    . sodium chloride 1 mL/hr at 06/09/2019 1900  . albumin human    . cisatracurium (NIMBEX) infusion 5 mcg/kg/min (05/30/19 0800)  . DOPamine Stopped (06/20/2019 0934)  . EPINEPHrine 4 mg in dextrose 5% 250 mL infusion (16 mcg/mL)    . fentaNYL infusion INTRAVENOUS 250 mcg/hr (05/30/19 0800)  . impella catheter heparin 50 unit/mL in dextrose 5% 50,000 Units (05/23/19 0815)  . heparin 500 Units/hr (05/30/19 0800)  . lactated ringers    . magnesium sulfate    . meropenem (MERREM) IV 1 g  (05/30/19 0825)  . midazolam 5 mg/hr (05/30/19 0800)  . nitroGLYCERIN    . norepinephrine (LEVOPHED) Adult infusion Stopped (05/30/19 0754)  . pantoprozole (PROTONIX) infusion 8 mg/hr (05/30/19 0800)  . prismasol BGK 0/2.5    . prismasol BGK 0/2.5    . prismasol BGK 4/2.5 2,000 mL/hr at 06/07/2019 2353  . vancomycin      Scheduled Medications: . sodium chloride   Intravenous Once  . sodium chloride   Intravenous Once  . sodium chloride   Intravenous Once  . acetaminophen  1,000 mg Oral Q6H   Or  . acetaminophen (TYLENOL) oral liquid 160 mg/5 mL  1,000 mg Per Tube Q6H  . acetaminophen (TYLENOL) oral liquid 160 mg/5 mL  650 mg Per Tube Once   Or  . acetaminophen  650 mg Rectal Once  . arformoterol  15 mcg Nebulization BID  . artificial tears  1 application Both Eyes H8E  . aspirin  81 mg Per Tube Daily  . atorvastatin  80 mg Per Tube q1800  . bisacodyl  10 mg Oral Daily   Or  . bisacodyl  10 mg Rectal Daily  . chlorhexidine  15 mL Mouth Rinse BID  . Chlorhexidine Gluconate Cloth  6 each Topical Daily  . docusate sodium  200 mg Oral Daily  . fentaNYL (SUBLIMAZE) injection  25 mcg Intravenous Once  . insulin aspart  1-3 Units Subcutaneous Q4H  . ipratropium  0.5 mg Nebulization BID  . mouth rinse  15 mL Mouth Rinse q12n4p  . methylPREDNISolone (SOLU-MEDROL) injection  40 mg Intravenous Q12H  . metoCLOPramide (REGLAN) injection  5 mg Intravenous Q8H  . metoprolol tartrate  12.5 mg Oral BID   Or  . metoprolol tartrate  12.5 mg Per Tube BID  . [START ON 06/01/2019] pantoprazole  40 mg Intravenous Q12H  . senna-docusate  1 tablet Oral BID  . sodium chloride flush  10-40 mL Intracatheter Q12H  . sodium chloride flush  3 mL Intravenous Q12H  . sodium chloride flush  3 mL Intravenous Q12H    have reviewed scheduled and prn medications.  Physical Exam: General: Intubated and sedated, critically ill Heart:RRR, s1s2 nl Lungs: Bibasal coarse breath sound, no wheezing Abdomen:soft,  Non-tender, non-distended Extremities: Trace edema Dialysis Access: Left IJ catheter site clean.  Multiple line for ECMO.  Lamar Naef Prasad Toriano Aikey 05/30/2019,8:41 AM  LOS: 14 days  Pager: [phone]

## 2019-05-30 NOTE — Addendum Note (Signed)
Addendum  created 05/30/19 0757 by Sammie Bench, CRNA   Order list changed

## 2019-05-30 NOTE — Progress Notes (Signed)
NAME:  Raymond Chavez, MRN:  [id number], DOB:  08-22-1950, LOS: 36 ADMISSION DATE:  05/22/2019, CONSULTATION DATE:  05/15/2019 REFERRING MD:  Billy Fischer  CHIEF COMPLAINT:  SOB   Brief History   Raymond Chavez is a 68 y.o. male who was admitted with acute hypoxic respiratory failure due to acute pulmonary edema in setting of NSTEMI, cardiogenic shock, possible PNA, He required intubation in the ED after failing BiPAP. On impella for hemodynamic support CVTS and cardiology are discussing about CABG, LVAD  Past Medical History  HTN, CAD.  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  10/27-heparin stopped due to low platelets.  HIT panel sent.  Starting bivalirudin.  Off Levophed.  On minimal vent support but failed weaning trials 10/28 Bronchoscopy  10/29 Started on CRRT 10/30 Bronchoscopy with mucous plug removal 11/1 Extubated 11/4 progressive shock, respiratory failure requiring reintubation.  Pulmonary hemorrhage, heparin held.  Antibiotics broadened empirically. 11/4 cannulated and VA ECMO initiated 1730   Consults:  Cardiology PCCM Nephrology   Procedures:  ETT 10/22 >11/1 Swan 10/22 > Impella 10/23 >> 10/24 Changed to 5.0 via right axillary >>  Vas cath 10/29  Aline 11/3  Significant Diagnostic Tests:  CTA chest 10/22 > no PE, b/l basilar consolidation, diffuse interlobular septal thickening with GGO's, b/l hilar adenopathy. Echo 11/2: LVEF 15%, impella in place 10/27 UE venous doppler: Right: No evidence of deep vein thrombosis in the upper extremity. However, unable to visualize the IJV, axillary. No evidence of superficial vein thrombosis in the upper extremity. However, unable to visualize the IJV, axillary. No evidence of thrombosis in the . However, unable to visualize the IJV, axillary.    Left: No evidence of thrombosis in the subclavian.     Micro Data:  Blood 10/22, 10/24, 10/28, 10/29 >>> NGTD Sputum 10/24 >>> Neg Urine 10/22 >>>Neg Urine Strep > Neg RVP 10/22 > Neg MRSA PCR > Neg SARS CoV2 10/22 > neg. BAL 10/28 > Enterococcus faecalis (40 K) Blood 10/29: negative  Antimicrobials:  Vanc 10/22 >10/25, 10/28>10/31 Cefepime 10/22 >10/26, 10/29>11/1 Ampicillin 11/1>11/4 vanc 11/4-> Merrem11/4->  Interim history/subjective:  VA ECMO initiated 11/4 at 1730 CVVHD running Impella device remains in place, pressors: Norepinephrine 2  Objective:  Blood pressure (!) 84/62, pulse 70, temperature 98.8 F (37.1 C), resp. rate 18, height _0  (1.702 m), weight 72 kg, SpO2 100 %. PAP: (11-77)/(7-46) 24/18 CVP:  [4 mmHg-6 mmHg] 4 mmHg CO:  [4.4 L/min] 4.4 L/min CI:  [2.4 L/min/m2] 2.4 L/min/m2  Vent Mode: PRVC FiO2 (%):  [30 %-80 %] 30 % Set Rate:  [18 bmp-20 bmp] 18 bmp Vt Set:  [400 mL-520 mL] 400 mL PEEP:  [5 cmH20] 5 cmH20 Plateau Pressure:  [16 cmH20-20 cmH20] 20 cmH20   Intake/Output Summary (Last 24 hours) at 05/30/2019 0815 Last data filed at 05/30/2019 0800 Gross per 24 hour  Intake 7200.97 ml  Output 4528 ml  Net 2672.97 ml   Filed Weights   05/26/19 0115 05/27/19 0329 05/28/19 0500  Weight: 71.8 kg 73 kg 72 kg   Physical Exam: General: Critically ill-appearing man, intubated, sedated, ECMO circuit in place and running HENT: ET tube in place.  No significant coffee grounds or blood from his ET tube or in the ventilator circuit Eyes: Pupils equal, no icterus or injection  Respiratory: Coarse bilaterally Cardiovascular: Continuous mechanical noise present GI: Nondistended, positive bowel sounds Extremities trace lower extremity edema Neuro: Paralyzed, sedated  Assessment & Plan:   Acute hypoxic respiratory failure requiring intubation -multifactorial  Pulmonary hemorrhage, appears to have stabilized 11/5 Aspiration event, with presumed  pneumonia ARDS Extubated 11/2 and reintubated 11/4 am Plan  -ECMO running, FiO2 100%, sweep being adjusted based on pH and some residual respiratory acidosis.  Suspect a component of mixed acidosis given recent hypoperfusion.  Lactate 11/4 reassuring -Current vent settings PRVC 6 cc/kg, FiO2 0.30, PEEP 5, rate 16 >> P plat 17 -Serial ABGs -Heparin reinitiated, and he appears to be tolerating.   Cardiogenic Shock in setting of severe 3V Disease with depressed LV Dysfunction  H/O MI 1998, CAD  EF 15-20% with decreased RV systolic function  56/43 RHC/LHC which showed severe 3V CAD (LM 75%, LAD 100%, LCX 80%, RCA 80% prox) and severely depressed LV function Plan -Impella device in place -Norepinephrine currently at 2, weaning, goal to off -Echocardiogram to confirm Impella placement, evaluate LV function -Tentative plan for CABG next week based on stability -LDH appears to have plateaued, continue to follow -Home antihypertensive regimen on hold (amlodipine, hydrochlorothiazide, lisinopril)  Co-comitant Septic Shock secondary to Enterococcus Faecalis PNA Aspiration event, probable recurrent pneumonia 11/4  Plan -Wean norepinephrine as able -Follow WBC, clinical status -Empiric vancomycin, meropenem 11/4  Acute Kidney Injury  Plan -Back on CVVHD, tolerating.  Plan to keep I =O   Acute on chronic normocytic anemia:  -Follow CBC, goal hemoglobin > 7.0 -Continue empiric PPI infusion for now.  Suspect that blood loss was pulmonary not GI Thrombocytopenia HIT panel negative -Goal PLT > 50   Hypophosphatemia Plan - follow BMP and replete  ?ileus:  -Continue bowel regimen  Goals of care:  Needs to recover from current setback, hopefully achieve renal recovery in order to consider CABG, LVAD.  Goals of care discussions ongoing depending on clinical progress  Best Practice:  Diet: npo Pain/Anxiety/Delirium protocol: versed prn, fentanyl VAP protocol: yes DVT prophylaxis:  SCD's  GI prophylaxis: PPI. Glucose control: SSI. Mobility: bedrest Code Status: Full. Family Communication: Ongoing Disposition: ICU.  Labs   CBC: Recent Labs  Lab 05/27/19 1651 05/28/19 0329  06/21/2019 0211  05/28/2019 1647  05/30/2019 1920  06/04/2019 2308  05/30/19 0305 05/30/19 0306 05/30/19 0419 05/30/19 0550 05/30/19 0552 05/30/19 0656  WBC 26.8* 29.2*   < > 30.1*  --  29.7*  --  29.7*  --  19.3*  --  15.0*  --   --  15.6*  --   --   NEUTROABS 20.5* 22.8*  --  22.3*  --   --   --   --   --  15.6*  --   --   --   --  12.9*  --   --   HGB 7.7* 8.7*   < > 5.9*   < > 9.9*   < > 8.2*   < > 11.0*   < > 9.2* 8.5* 8.2* 9.0* 8.8* 8.2*  HCT 22.8* 26.8*   < > 18.0*   < > 29.0*   < > 24.0*   < > 31.7*   < > 26.7* 25.0* 24.0* 26.4* 26.0* 24.0*  MCV 89.1 90.8   < > 91.4  --  88.7  --  88.9  --  88.5  --  89.6  --   --  90.1  --   --   PLT 95* 93*   < >  54*  --  47*  --  52*  --  46*  --  77*  --   --  97*  --   --    < > = values in this interval not displayed.   Basic Metabolic Panel: Recent Labs  Lab 05/26/19 0250  05/27/19 0309  05/27/19 1414 05/28/19 0342  05/28/19 1620  06/18/2019 0211  06/17/2019 0416 06/09/2019 1649  06/13/2019 1920  06/24/2019 2308  05/30/19 0305 05/30/19 0306 05/30/19 0419 05/30/19 0550 05/30/19 0552 05/30/19 0656  NA 136   < > 135   < > 135 136   < > 136   < > 139   < > 141 138   < > 140   < > 139   < > 139 139 139 139 139 139  K 4.0   < > 4.4   < > 4.4 4.1   < > 4.1   < > 4.8   < > 4.5 5.2*   < > 5.7*   < > 5.5*   < > 5.0 5.0 5.2* 5.2* 5.2* 5.2*  CL 104   < > 101  --  100 99  --  100  --  96*  --  100 102  --  105  --  106  --  105  --   --  106  --   --   CO2 24   < > 21*  --  21* 24  --  23  --  23  --  23  --   --  22  --  23  --  25  --   --  25  --   --   GLUCOSE 119*   < > 124*  --  135* 134*  --  140*  --  138*  --  145* 139*  --  160*  --  151*  --  110*  --   --  113*  --   --   BUN 33*   < > 36*  --  32* 30*  --  52*  --  74*  --  79* 56*  --  67*   --  54*  --  45*  --   --  42*  --   --   CREATININE 1.45*   < > 1.39*  --  1.31* 1.36*  --  2.16*  --  3.13*  --  3.01* 2.40*  --  2.68*  --  2.17*  --  1.98*  --   --  1.93*  --   --   CALCIUM 7.7*   < > 7.8*  --  7.7* 8.0*  --  7.6*  --  6.7*  --  6.9*  --   --  6.2*  --  6.2*  --  6.9*  --   --  6.9*  --   --   MG 2.4  --  2.5*  --   --  2.6*  --   --   --  2.4  --   --   --   --   --   --   --   --  2.4  --   --   --   --   --   PHOS 1.5*   < > 1.8*  --  3.8 2.1*  2.1*  --  4.5  --   --   --  7.1*  --   --  6.0*  --   --   --   --   --   --   --   --   --    < > = values in this interval not displayed.   GFR: Estimated Creatinine Clearance: 33.8 mL/min (A) (by C-G formula based on SCr of 1.93 mg/dL (H)). Recent Labs  Lab 05/23/19 1217  05/24/19 0420 05/25/19 0341  05/28/19 0342  06/17/2019 1920 06/18/2019 1922 06/17/2019 2308 05/30/19 0305 05/30/19 0425 05/30/19 0550  PROCALCITON 0.72  --  0.40 0.45  --  1.26  --   --   --   --   --   --   --   WBC  --    < > 17.9* 19.0*   < >  --    < > 29.7*  --  19.3* 15.0*  --  15.6*  LATICACIDVEN  --   --   --   --   --   --   --   --  1.2  --   --  1.0  --    < > = values in this interval not displayed.   Liver Function Tests: Recent Labs  Lab 06/17/2019 0211 06/17/2019 0416 05/27/2019 1920 05/28/2019 2308 05/30/19 0305 05/30/19 0550  AST 100*  --   --  41 34 31  ALT 60*  --   --  40 38 38  ALKPHOS 50  --   --  39 43 44  BILITOT 1.5*  --   --  1.9* 1.6* 1.7*  PROT 3.4*  --   --  3.7* 4.4* 4.4*  ALBUMIN 1.4* 1.9* 1.5* 2.0* 2.4* 2.4*   No results for input(s): LIPASE, AMYLASE in the last 168 hours. No results for input(s): AMMONIA in the last 168 hours. ABG    Component Value Date/Time   PHART 7.326 (L) 05/30/2019 0656   PCO2ART 57.8 (H) 05/30/2019 0656   PO2ART 560.0 (H) 05/30/2019 0656   HCO3 30.2 (H) 05/30/2019 0656   TCO2 32 05/30/2019 0656   ACIDBASEDEF 1.0 06/19/2019 0255   O2SAT 100.0 05/30/2019 0656    Coagulation Profile:  Recent Labs  Lab 06/04/2019 1647 05/27/2019 1920 06/14/2019 2308 05/30/19 0305 05/30/19 0550  INR 1.6* 2.0* 1.8* 1.5* 1.4*   Cardiac Enzymes: No results for input(s): CKTOTAL, CKMB, CKMBINDEX, TROPONINI in the last 168 hours. HbA1C: Hgb A1c MFr Bld  Date/Time Value Ref Range Status  05/05/2019 11:25 PM 5.5 4.8 - 5.6 % Final    Comment:    (NOTE) Pre diabetes:          5.7%-6.4% Diabetes:              >6.4% Glycemic control for   <7.0% adults with diabetes    CBG: Recent Labs  Lab 06/24/2019 0828 06/12/2019 1202 06/15/2019 1946 05/30/19 0020 05/30/19 0434  GLUCAP 171* 152* 141* 114* 84    Independent critical care time 45 minutes  Baltazar Apo, MD, PhD 05/30/2019, 9:52 AM [facility] [phone] or if no answer [phone]

## 2019-05-31 ENCOUNTER — Inpatient Hospital Stay (HOSPITAL_COMMUNITY): Payer: PPO

## 2019-05-31 LAB — BASIC METABOLIC PANEL
Anion gap: 7 (ref 5–15)
BUN: 30 mg/dL — ABNORMAL HIGH (ref 8–23)
CO2: 24 mmol/L (ref 22–32)
Calcium: 7 mg/dL — ABNORMAL LOW (ref 8.9–10.3)
Chloride: 104 mmol/L (ref 98–111)
Creatinine, Ser: 1.32 mg/dL — ABNORMAL HIGH (ref 0.61–1.24)
GFR calc Af Amer: >60 mL/min (ref >60)
GFR calc non Af Amer: 55 mL/min — ABNORMAL LOW (ref >60)
Glucose, Bld: 181 mg/dL — ABNORMAL HIGH (ref 70–99)
Potassium: 4.5 mmol/L (ref 3.5–5.1)
Sodium: 135 mmol/L (ref 135–145)

## 2019-05-31 LAB — POCT I-STAT 7, (LYTES, BLD GAS, ICA,H+H)
Acid-Base Excess: 6 mmol/L — ABNORMAL HIGH (ref 0.0–2.0)
Acid-Base Excess: 3 mmol/L — ABNORMAL HIGH (ref 0.0–2.0)
Acid-Base Excess: 1 mmol/L (ref 0.0–2.0)
Acid-Base Excess: 6 mmol/L — ABNORMAL HIGH (ref 0.0–2.0)
Acid-Base Excess: 5 mmol/L — ABNORMAL HIGH (ref 0.0–2.0)
Bicarbonate: 30.2 mmol/L — ABNORMAL HIGH (ref 20.0–28.0)
Bicarbonate: 27.3 mmol/L (ref 20.0–28.0)
Bicarbonate: 28.1 mmol/L — ABNORMAL HIGH (ref 20.0–28.0)
Bicarbonate: 29.2 mmol/L — ABNORMAL HIGH (ref 20.0–28.0)
Bicarbonate: 28.7 mmol/L — ABNORMAL HIGH (ref 20.0–28.0)
Calcium, Ion: 1 mmol/L — ABNORMAL LOW (ref 1.15–1.40)
Calcium, Ion: 1.05 mmol/L — ABNORMAL LOW (ref 1.15–1.40)
Calcium, Ion: 1.06 mmol/L — ABNORMAL LOW (ref 1.15–1.40)
Calcium, Ion: 1.02 mmol/L — ABNORMAL LOW (ref 1.15–1.40)
Calcium, Ion: 1.04 mmol/L — ABNORMAL LOW (ref 1.15–1.40)
HCT: 18 % — ABNORMAL LOW (ref 39.0–52.0)
HCT: 21 % — ABNORMAL LOW (ref 39.0–52.0)
HCT: 28 % — ABNORMAL LOW (ref 39.0–52.0)
HCT: 23 % — ABNORMAL LOW (ref 39.0–52.0)
HCT: 23 % — ABNORMAL LOW (ref 39.0–52.0)
Hemoglobin: 6.1 g/dL — CL (ref 13.0–17.0)
Hemoglobin: 7.1 g/dL — ABNORMAL LOW (ref 13.0–17.0)
Hemoglobin: 9.5 g/dL — ABNORMAL LOW (ref 13.0–17.0)
Hemoglobin: 7.8 g/dL — ABNORMAL LOW (ref 13.0–17.0)
Hemoglobin: 7.8 g/dL — ABNORMAL LOW (ref 13.0–17.0)
O2 Saturation: 100 %
O2 Saturation: 100 %
O2 Saturation: 100 %
O2 Saturation: 100 %
O2 Saturation: 100 %
Patient temperature: 37.2
Patient temperature: 37.1
Patient temperature: 37.2
Patient temperature: 36.1
Patient temperature: 97.9
Potassium: 4.7 mmol/L (ref 3.5–5.1)
Potassium: 4.8 mmol/L (ref 3.5–5.1)
Potassium: 4.7 mmol/L (ref 3.5–5.1)
Potassium: 4.6 mmol/L (ref 3.5–5.1)
Potassium: 4.5 mmol/L (ref 3.5–5.1)
Sodium: 138 mmol/L (ref 135–145)
Sodium: 137 mmol/L (ref 135–145)
Sodium: 138 mmol/L (ref 135–145)
Sodium: 138 mmol/L (ref 135–145)
Sodium: 137 mmol/L (ref 135–145)
TCO2: 31 mmol/L (ref 22–32)
TCO2: 29 mmol/L (ref 22–32)
TCO2: 30 mmol/L (ref 22–32)
TCO2: 30 mmol/L (ref 22–32)
TCO2: 30 mmol/L (ref 22–32)
pCO2 arterial: 40.4 mmHg (ref 32.0–48.0)
pCO2 arterial: 39.9 mmHg (ref 32.0–48.0)
pCO2 arterial: 58.3 mmHg — ABNORMAL HIGH (ref 32.0–48.0)
pCO2 arterial: 34.8 mmHg (ref 32.0–48.0)
pCO2 arterial: 35.9 mmHg (ref 32.0–48.0)
pH, Arterial: 7.482 — ABNORMAL HIGH (ref 7.350–7.450)
pH, Arterial: 7.444 (ref 7.350–7.450)
pH, Arterial: 7.292 — ABNORMAL LOW (ref 7.350–7.450)
pH, Arterial: 7.528 — ABNORMAL HIGH (ref 7.350–7.450)
pH, Arterial: 7.51 — ABNORMAL HIGH (ref 7.350–7.450)
pO2, Arterial: 519 mmHg — ABNORMAL HIGH (ref 83.0–108.0)
pO2, Arterial: 454 mmHg — ABNORMAL HIGH (ref 83.0–108.0)
pO2, Arterial: 491 mmHg — ABNORMAL HIGH (ref 83.0–108.0)
pO2, Arterial: 519 mmHg — ABNORMAL HIGH (ref 83.0–108.0)
pO2, Arterial: 461 mmHg — ABNORMAL HIGH (ref 83.0–108.0)

## 2019-05-31 LAB — COMPREHENSIVE METABOLIC PANEL
ALT: 22 U/L (ref 0–44)
ALT: 22 U/L (ref 0–44)
AST: 22 U/L (ref 15–41)
AST: 32 U/L (ref 15–41)
Albumin: 2.3 g/dL — ABNORMAL LOW (ref 3.5–5.0)
Albumin: 2.7 g/dL — ABNORMAL LOW (ref 3.5–5.0)
Alkaline Phosphatase: 38 U/L (ref 38–126)
Alkaline Phosphatase: 38 U/L (ref 38–126)
Anion gap: 6 (ref 5–15)
Anion gap: 10 (ref 5–15)
BUN: 33 mg/dL — ABNORMAL HIGH (ref 8–23)
BUN: 31 mg/dL — ABNORMAL HIGH (ref 8–23)
CO2: 24 mmol/L (ref 22–32)
CO2: 23 mmol/L (ref 22–32)
Calcium: 6.8 mg/dL — ABNORMAL LOW (ref 8.9–10.3)
Calcium: 7.2 mg/dL — ABNORMAL LOW (ref 8.9–10.3)
Chloride: 107 mmol/L (ref 98–111)
Chloride: 103 mmol/L (ref 98–111)
Creatinine, Ser: 1.45 mg/dL — ABNORMAL HIGH (ref 0.61–1.24)
Creatinine, Ser: 1.43 mg/dL — ABNORMAL HIGH (ref 0.61–1.24)
GFR calc Af Amer: 57 mL/min — ABNORMAL LOW (ref >60)
GFR calc Af Amer: 58 mL/min — ABNORMAL LOW (ref >60)
GFR calc non Af Amer: 49 mL/min — ABNORMAL LOW (ref >60)
GFR calc non Af Amer: 50 mL/min — ABNORMAL LOW (ref >60)
Glucose, Bld: 184 mg/dL — ABNORMAL HIGH (ref 70–99)
Glucose, Bld: 172 mg/dL — ABNORMAL HIGH (ref 70–99)
Potassium: 4.7 mmol/L (ref 3.5–5.1)
Potassium: 4.6 mmol/L (ref 3.5–5.1)
Sodium: 137 mmol/L (ref 135–145)
Sodium: 136 mmol/L (ref 135–145)
Total Bilirubin: 1.7 mg/dL — ABNORMAL HIGH (ref 0.3–1.2)
Total Bilirubin: 1.8 mg/dL — ABNORMAL HIGH (ref 0.3–1.2)
Total Protein: 4.1 g/dL — ABNORMAL LOW (ref 6.5–8.1)
Total Protein: 4.5 g/dL — ABNORMAL LOW (ref 6.5–8.1)

## 2019-05-31 LAB — PREPARE PLATELET PHERESIS
Unit division: 0
Unit division: 0
Unit division: 0

## 2019-05-31 LAB — BPAM PLATELET PHERESIS
Blood Product Expiration Date: 202011062359
Blood Product Expiration Date: 202011072359
Blood Product Expiration Date: 202011072359
ISSUE DATE / TIME: 202011050020
ISSUE DATE / TIME: 202011050020
ISSUE DATE / TIME: 202011052039
Unit Type and Rh: 1700
Unit Type and Rh: 5100
Unit Type and Rh: 7300

## 2019-05-31 LAB — CBC
HCT: 20.7 % — ABNORMAL LOW (ref 39.0–52.0)
HCT: 29.1 % — ABNORMAL LOW (ref 39.0–52.0)
HCT: 25.6 % — ABNORMAL LOW (ref 39.0–52.0)
HCT: 24.2 % — ABNORMAL LOW (ref 39.0–52.0)
Hemoglobin: 7 g/dL — ABNORMAL LOW (ref 13.0–17.0)
Hemoglobin: 9.8 g/dL — ABNORMAL LOW (ref 13.0–17.0)
Hemoglobin: 8.9 g/dL — ABNORMAL LOW (ref 13.0–17.0)
Hemoglobin: 8.3 g/dL — ABNORMAL LOW (ref 13.0–17.0)
MCH: 31.1 pg (ref 26.0–34.0)
MCH: 31.4 pg (ref 26.0–34.0)
MCH: 31.6 pg (ref 26.0–34.0)
MCH: 31.6 pg (ref 26.0–34.0)
MCHC: 33.8 g/dL (ref 30.0–36.0)
MCHC: 33.7 g/dL (ref 30.0–36.0)
MCHC: 34.8 g/dL (ref 30.0–36.0)
MCHC: 34.3 g/dL (ref 30.0–36.0)
MCV: 92 fL (ref 80.0–100.0)
MCV: 93.3 fL (ref 80.0–100.0)
MCV: 90.8 fL (ref 80.0–100.0)
MCV: 92 fL (ref 80.0–100.0)
Platelets: 68 10*3/uL — ABNORMAL LOW (ref 150–400)
Platelets: 45 10*3/uL — ABNORMAL LOW (ref 150–400)
Platelets: 38 10*3/uL — ABNORMAL LOW (ref 150–400)
Platelets: 30 10*3/uL — ABNORMAL LOW (ref 150–400)
RBC: 2.25 MIL/uL — ABNORMAL LOW (ref 4.22–5.81)
RBC: 3.12 MIL/uL — ABNORMAL LOW (ref 4.22–5.81)
RBC: 2.82 MIL/uL — ABNORMAL LOW (ref 4.22–5.81)
RBC: 2.63 MIL/uL — ABNORMAL LOW (ref 4.22–5.81)
RDW: 14.7 % (ref 11.5–15.5)
RDW: 14.3 % (ref 11.5–15.5)
RDW: 14.4 % (ref 11.5–15.5)
RDW: 14.9 % (ref 11.5–15.5)
WBC: 16 10*3/uL — ABNORMAL HIGH (ref 4.0–10.5)
WBC: 18.4 10*3/uL — ABNORMAL HIGH (ref 4.0–10.5)
WBC: 18.9 10*3/uL — ABNORMAL HIGH (ref 4.0–10.5)
WBC: 22.2 10*3/uL — ABNORMAL HIGH (ref 4.0–10.5)
nRBC: 0.7 % — ABNORMAL HIGH (ref 0.0–0.2)
nRBC: 0.6 % — ABNORMAL HIGH (ref 0.0–0.2)
nRBC: 0.6 % — ABNORMAL HIGH (ref 0.0–0.2)
nRBC: 0.4 % — ABNORMAL HIGH (ref 0.0–0.2)

## 2019-05-31 LAB — GLUCOSE, CAPILLARY
Glucose-Capillary: 139 mg/dL — ABNORMAL HIGH (ref 70–99)
Glucose-Capillary: 147 mg/dL — ABNORMAL HIGH (ref 70–99)
Glucose-Capillary: 153 mg/dL — ABNORMAL HIGH (ref 70–99)
Glucose-Capillary: 156 mg/dL — ABNORMAL HIGH (ref 70–99)
Glucose-Capillary: 159 mg/dL — ABNORMAL HIGH (ref 70–99)
Glucose-Capillary: 177 mg/dL — ABNORMAL HIGH (ref 70–99)

## 2019-05-31 LAB — COOXEMETRY PANEL
Carboxyhemoglobin: 2.8 % — ABNORMAL HIGH (ref 0.5–1.5)
Carboxyhemoglobin: 2 % — ABNORMAL HIGH (ref 0.5–1.5)
Methemoglobin: 1.4 % (ref 0.0–1.5)
Methemoglobin: 1 % (ref 0.0–1.5)
O2 Saturation: 88.3 %
O2 Saturation: 85.1 %
Total hemoglobin: 7.1 g/dL — ABNORMAL LOW (ref 12.0–16.0)
Total hemoglobin: 10.7 g/dL — ABNORMAL LOW (ref 12.0–16.0)

## 2019-05-31 LAB — MAGNESIUM
Magnesium: 2.3 mg/dL (ref 1.7–2.4)
Magnesium: 2.4 mg/dL (ref 1.7–2.4)

## 2019-05-31 LAB — PROTIME-INR
INR: 1.6 — ABNORMAL HIGH (ref 0.8–1.2)
INR: 1.7 — ABNORMAL HIGH (ref 0.8–1.2)
Prothrombin Time: 18.4 seconds — ABNORMAL HIGH (ref 11.4–15.2)
Prothrombin Time: 19.5 seconds — ABNORMAL HIGH (ref 11.4–15.2)

## 2019-05-31 LAB — FIBRINOGEN
Fibrinogen: 264 mg/dL (ref 210–475)
Fibrinogen: 355 mg/dL (ref 210–475)

## 2019-05-31 LAB — LACTATE DEHYDROGENASE: LDH: 405 U/L — ABNORMAL HIGH (ref 98–192)

## 2019-05-31 LAB — POCT ACTIVATED CLOTTING TIME: Activated Clotting Time: 114 seconds

## 2019-05-31 LAB — LACTIC ACID, PLASMA
Lactic Acid, Venous: 1.1 mmol/L (ref 0.5–1.9)
Lactic Acid, Venous: 1.2 mmol/L (ref 0.5–1.9)

## 2019-05-31 LAB — HEPARIN LEVEL (UNFRACTIONATED): Heparin Unfractionated: <0.1 IU/mL — ABNORMAL LOW (ref 0.30–0.70)

## 2019-05-31 LAB — APTT
aPTT: 40 seconds — ABNORMAL HIGH (ref 24–36)
aPTT: 43 seconds — ABNORMAL HIGH (ref 24–36)
aPTT: 44 seconds — ABNORMAL HIGH (ref 24–36)
aPTT: 79 seconds — ABNORMAL HIGH (ref 24–36)
aPTT: 56 seconds — ABNORMAL HIGH (ref 24–36)

## 2019-05-31 LAB — PHOSPHORUS
Phosphorus: 3.6 mg/dL (ref 2.5–4.6)
Phosphorus: 3.4 mg/dL (ref 2.5–4.6)

## 2019-05-31 LAB — PREPARE RBC (CROSSMATCH)

## 2019-05-31 MED ORDER — ALBUMIN HUMAN 5 % IV SOLN
12.5000 g | Freq: Once | INTRAVENOUS | Status: DC
  Filled 2019-05-31: qty 250

## 2019-05-31 MED ORDER — ALBUMIN HUMAN 5 % IV SOLN
INTRAVENOUS | Status: AC
  Filled 2019-05-31: qty 500

## 2019-05-31 MED ORDER — MUPIROCIN 2 % EX OINT
TOPICAL_OINTMENT | Freq: Two times a day (BID) | CUTANEOUS | Status: DC
  Administered 2019-05-31: 10:00:00 via NASAL
  Administered 2019-05-31: 1 via NASAL
  Administered 2019-06-01: 10:00:00 via NASAL
  Administered 2019-06-01: 1 via NASAL
  Administered 2019-06-02 (×2): via NASAL
  Administered 2019-06-03: 1 via NASAL
  Administered 2019-06-03 – 2019-06-07 (×7): via NASAL
  Administered 2019-06-08: 1 via NASAL
  Filled 2019-05-31 (×3): qty 22

## 2019-05-31 MED ORDER — ALBUMIN HUMAN 25 % IV SOLN
12.5000 g | Freq: Once | INTRAVENOUS | Status: AC
  Administered 2019-05-31: 12.5 g via INTRAVENOUS

## 2019-05-31 MED ORDER — HEPARIN BOLUS VIA INFUSION
1000.0000 [IU] | Freq: Once | INTRAVENOUS | Status: AC
  Administered 2019-05-31: 1000 [IU] via INTRAVENOUS

## 2019-05-31 MED ORDER — SODIUM CHLORIDE 0.9% IV SOLUTION
Freq: Once | INTRAVENOUS | Status: AC
  Administered 2019-05-31: 04:00:00 via INTRAVENOUS

## 2019-05-31 MED ORDER — ALBUMIN HUMAN 25 % IV SOLN
12.5000 g | Freq: Once | INTRAVENOUS | Status: AC
  Administered 2019-05-31: 12.5 g via INTRAVENOUS
  Filled 2019-05-31: qty 50

## 2019-05-31 MED ORDER — HEPARIN (PORCINE) 25000 UT/250ML-% IV SOLN
600.0000 [IU]/h | INTRAVENOUS | Status: DC
  Administered 2019-06-02: 10:00:00 500 [IU]/h via INTRAVENOUS
  Filled 2019-05-31: qty 250

## 2019-05-31 NOTE — Progress Notes (Signed)
Pharmacy CONSULT NOTE  Pharmacy Consult for heparin Indication for heparin: Impella 5.0 & ECMO  No Known Allergies  Patient Measurements: Height: 5\' 7"  (170.2 cm)(measured x3) Weight: 164 lb 14.5 oz (74.8 kg) IBW/kg (Calculated) : 66.1  Vital Signs: Temp: 99.1 F (37.3 C) (11/06 1515) Temp Source: Core (11/06 1200) Pulse Rate: 70 (11/06 1200)  Labs: Recent Labs    05/27/2019 1109  06/15/2019 2209  05/30/19 0550  05/30/19 0903  05/30/19 1112  05/30/19 1626  05/31/19 0308 05/31/19 0317 05/31/19 0554 05/31/19 0927 05/31/19 1113 05/31/19 1414  HGB  --    < >  --    < > 9.0*   < >  --    < >  --    < > 7.5*   < > 7.0* 6.1* 7.1*  --  9.8*  --   HCT  --    < >  --    < > 26.4*   < >  --    < >  --    < > 21.6*   < > 20.7* 18.0* 21.0*  --  29.1*  --   PLT  --    < >  --    < > 97*  --   --    < >  --    < > 76*  --  68*  --   --   --  45*  --   APTT  --    < >  --    < > >200*  --  >200*  --   --    < > 48*   < > 40*  --   --  43*  --  44*  LABPROT  --    < >  --    < > 16.7*  --   --   --   --   --  17.5*  --  18.4*  --   --   --   --   --   INR  --    < >  --    < > 1.4*  --   --   --   --   --  1.5*  --  1.6*  --   --   --   --   --   HEPARINUNFRC 0.13*  --  0.45  --   --   --  0.79*  --   --   --   --   --   --   --   --   --   --   --   CREATININE  --    < >  --    < > 1.93*  --   --   --  1.82*  --  1.69*  --  1.45*  --   --   --   --   --    < > = values in this interval not displayed.    Estimated Creatinine Clearance: 45 mL/min (A) (by C-G formula based on SCr of 1.45 mg/dL (H)).  Assessment: 19 yoM admitted with shock and concern for ACS now s/p Impella CP placement in cath lab. Impella CP swapped out for 5.0 d/t hemolysis. Underwent [facility] ECMO with goal of CABG for revascularization.   ECMO initiate about 1800 Wed- originally started on heparin at 1500 units/hr systemically, which was held for bleeding. Peripheral  Heparin was restarted 11/6 about noon 100 uts/hr now  titrated up  200 uts/hr for thrombosis prevention.  Aptt 40 this am will recheck q6 today with heparin level to assess correlation.    Currently impella purge flow is 14.1 mL/hr (705 units/hr)     Goal of Therapy:  APTT 50 - 70 sec Monitor platelets by anticoagulation protocol: Yes   Plan:  Continue heparin purge Add IV heparin 100 - 200 units/hr - discussed with Dr [name] Repeat aPTT at [place].D. CPP, BCPS Clinical Pharmacist [phone] 05/31/2019 3:35 PM

## 2019-05-31 NOTE — Progress Notes (Signed)
[facility] NEPHROLOGY PROGRESS NOTE  Assessment/ Plan: Pt is a 69 y.o. yo male with cardiogenic shock who underwent placement of Impella on 10/24, on milrinone and Levophed, cardiac cath with three-vessel disease and echocardiogram revealing EF of 20%, respiratory failure, consulted for AKI.  Baseline creatinine level around 1.3.  #Acute kidney injury on CKD: due to cardiorenal syndrome and contrast nephropathy. CRRT since 10/30.  Patient had cardiac arrest on 11/3.  Tolerating CRRT well with goal UF 50 to 100 cc/h.  The bath was changed to 4K last night.  Monitor potassium level.  #Cardiac arrest/cardiogenic shock: EF 20%, has Impella and now on ECMO.  Had  cardiac arrest on 11/3.   Heart failure team is following.    #Acute respiratory failure with hypoxia: Due to CHF and pneumonia.  Pulmonary team is following.  On mechanical ventilation and ECMO.  #CAD: NSTEMI, CABG with severe three-vessel disease.  On aspirin statin.  No beta-blocker because of shock.  #Hypophosphatemia: Phosphorus level acceptable.  Follow-up lab.  Discussed with ICU team.  Subjective: Seen and examined at bedside.  Patient has ECMO, Impella and on mechanical ventilation.  Sedated. Objective Vital signs in last 24 hours: Vitals:   05/31/19 0800 05/31/19 0815 05/31/19 0830 05/31/19 0845  BP:      Pulse: 70     Resp: '10 10 10 11  ' Temp: 99 F (37.2 C) 99.1 F (37.3 C) 99 F (37.2 C) 99.1 F (37.3 C)  TempSrc: Core     SpO2: 100% 99% 100% 100%  Weight:      Height:       Weight change:   Intake/Output Summary (Last 24 hours) at 05/31/2019 0858 Last data filed at 05/31/2019 0800 Gross per 24 hour  Intake 5141.96 ml  Output 3349 ml  Net 1792.96 ml       Labs: Basic Metabolic Panel: Recent Labs  Lab 05/30/19 0903  05/30/19 1112  05/30/19 1626  05/31/19 0308 05/31/19 0317 05/31/19 0554  NA  --    < > 137   < > 138   < > 137 138 137  K  --    < > 4.7   < > 4.7   < > 4.7 4.7 4.8   CL  --   --  106  --  106  --  107  --   --   CO2  --   --  23  --  25  --  24  --   --   GLUCOSE  --   --  145*  --  152*  --  184*  --   --   BUN  --   --  38*  --  37*  --  33*  --   --   CREATININE  --   --  1.82*  --  1.69*  --  1.45*  --   --   CALCIUM  --   --  6.8*  --  6.9*  --  6.8*  --   --   PHOS 5.1*  --   --   --  4.5  --  3.6  --   --    < > = values in this interval not displayed.   Liver Function Tests: Recent Labs  Lab 05/30/19 1112 05/30/19 1626 05/31/19 0308  AST '24 22 22  ' ALT '31 28 22  ' ALKPHOS 36* 37* 38  BILITOT 1.9* 1.3* 1.7*  PROT 4.1* 4.1* 4.1*  ALBUMIN  2.4* 2.2* 2.3*   No results for input(s): LIPASE, AMYLASE in the last 168 hours. No results for input(s): AMMONIA in the last 168 hours. CBC: Recent Labs  Lab 06/22/2019 0211  06/12/2019 2308  05/30/19 0550  05/30/19 0941  05/30/19 1239  05/30/19 1626  05/31/19 0308 05/31/19 0317 05/31/19 0554  WBC 30.1*   < > 19.3*   < > 15.6*  --  14.2*  --  13.7*  --  16.0*  --  16.0*  --   --   NEUTROABS 22.3*  --  15.6*  --  12.9*  --   --   --   --   --   --   --   --   --   --   HGB 5.9*   < > 11.0*   < > 9.0*   < > 8.0*   < > 7.9*   < > 7.5*   < > 7.0* 6.1* 7.1*  HCT 18.0*   < > 31.7*   < > 26.4*   < > 23.5*   < > 22.9*   < > 21.6*   < > 20.7* 18.0* 21.0*  MCV 91.4   < > 88.5   < > 90.1  --  90.0  --  89.5  --  90.8  --  92.0  --   --   PLT 54*   < > 46*   < > 97*  --  76*  --  73*  --  76*  --  68*  --   --    < > = values in this interval not displayed.   Cardiac Enzymes: No results for input(s): CKTOTAL, CKMB, CKMBINDEX, TROPONINI in the last 168 hours. CBG: Recent Labs  Lab 05/30/19 1610 05/30/19 2046 05/31/19 0013 05/31/19 0314 05/31/19 0746  GLUCAP 98 139* 156* 153* 177*    Iron Studies: No results for input(s): IRON, TIBC, TRANSFERRIN, FERRITIN in the last 72 hours. Studies/Results: Dg Abd 1 View  Result Date: 05/30/2019 CLINICAL DATA:  Fluoroscopic guided feeding tube placement. EXAM:  ABDOMEN - 1 VIEW COMPARISON:  Radiograph 06/01/2019 FINDINGS: Single fluoroscopic spot image demonstrates a feeding tube tip in the region of the fourth portion the duodenum. IMPRESSION: Feeding tube tip at the ligament of Treitz. Electronically Signed   By: Marijo Sanes M.D.   On: 05/30/2019 13:11   Dg Chest Port 1 View  Result Date: 05/30/2019 CLINICAL DATA:  Endotracheal tube placement EXAM: PORTABLE CHEST 1 VIEW COMPARISON:  May 29, 2019 FINDINGS: The Impella device is stable in positioning. The Swan-Ganz catheter tip projects over the main pulmonary artery. The enteric tube appears to extend below the left hemidiaphragm. Left-sided central venous catheter is stable in positioning. The endotracheal tube terminates just above the carina. The heart size is unchanged. The patient is status post prior median sternotomy. Upper lobe predominant hazy airspace opacities are again noted. There are bilateral pleural effusions. The right-sided PICC line appears stable in positioning. IMPRESSION: 1. Lines and tubes as above. The endotracheal tube terminates just above the carina. Repositioning should be considered. 2. Persistent upper lobe predominant hazy airspace opacities favored to represent pulmonary edema. Electronically Signed   By: Constance Holster M.D.   On: 05/30/2019 07:00   Dg Chest Port 1 View  Result Date: 06/14/2019 CLINICAL DATA:  Post ECMO.  Hypertension. EXAM: PORTABLE CHEST 1 VIEW COMPARISON:  06/15/2019 FINDINGS: Previously seen support devices are stable. Presumed ECMO device now projects over the  mediastinum. Bilateral airspace opacities are again noted, most pronounced in the upper lobes bilaterally. These are stable since prior study. No pneumothorax. No effusions. IMPRESSION: No significant change in the appearance of the lungs since prior study. Electronically Signed   By: Rolm Baptise M.D.   On: 05/26/2019 20:42   Dg Chest Port 1 View  Result Date: 06/02/2019 CLINICAL DATA:   Intubation, check support apparatus EXAM: PORTABLE CHEST 1 VIEW COMPARISON:  06/15/2019, 2 a.m. FINDINGS: No significant change in AP portable examination. Extensive support apparatus includes endotracheal tube, esophagogastric tube, left neck multi lumen vascular catheter, right neck pulmonary arterial catheter, right upper extremity approach Impella device, and right upper extremity PICC, all in unchanged position. Unchanged diffuse bilateral interstitial and heterogeneous pulmonary opacity. Defibrillator pad applied about the chest. IMPRESSION: 1. No significant change in AP portable examination. Extensive support apparatus includes endotracheal tube, esophagogastric tube, left neck multi lumen vascular catheter, right neck pulmonary arterial catheter, right upper extremity approach Impella device, and right upper extremity PICC, all in unchanged position. 2. Unchanged diffuse bilateral interstitial and heterogeneous pulmonary opacity, consistent with edema, infection, and/or ARDS. Electronically Signed   By: Eddie Candle M.D.   On: 06/07/2019 14:42    Medications: Infusions: .  prismasol BGK 4/2.5 400 mL/hr at 05/30/19 2036  .  prismasol BGK 4/2.5 300 mL/hr at 05/30/19 2034  . sodium chloride Stopped (05/31/19 0754)  . sodium chloride Stopped (05/28/19 0127)  . sodium chloride    . sodium chloride 1 mL/hr at 05/31/2019 1900  . albumin human    . cisatracurium (NIMBEX) infusion Stopped (05/30/19 0844)  . EPINEPHrine 4 mg in dextrose 5% 250 mL infusion (16 mcg/mL)    . feeding supplement (VITAL 1.5 CAL) 30 mL/hr at 05/31/19 0500  . fentaNYL infusion INTRAVENOUS 250 mcg/hr (05/31/19 0800)  . impella catheter heparin 50 unit/mL in dextrose 5% 50,000 Units (05/23/19 0815)  . magnesium sulfate    . meropenem (MERREM) IV 200 mL/hr at 05/31/19 0800  . midazolam 7 mg/hr (05/31/19 0800)  . norepinephrine (LEVOPHED) Adult infusion 3 mcg/min (05/30/19 2100)  . pantoprozole (PROTONIX) infusion 8 mg/hr  (05/31/19 0800)  . prismasol BGK 4/2.5 1,800 mL/hr at 05/31/19 3244  . vancomycin Stopped (05/30/19 1115)    Scheduled Medications: . acetaminophen  1,000 mg Oral Q6H   Or  . acetaminophen (TYLENOL) oral liquid 160 mg/5 mL  1,000 mg Per Tube Q6H  . acetaminophen (TYLENOL) oral liquid 160 mg/5 mL  650 mg Per Tube Once   Or  . acetaminophen  650 mg Rectal Once  . arformoterol  15 mcg Nebulization BID  . aspirin  81 mg Per Tube Daily  . atorvastatin  80 mg Per Tube q1800  . B-complex with vitamin C  1 tablet Per Tube Daily  . bisacodyl  10 mg Oral Daily   Or  . bisacodyl  10 mg Rectal Daily  . chlorhexidine gluconate (MEDLINE KIT)  15 mL Mouth Rinse BID  . Chlorhexidine Gluconate Cloth  6 each Topical Daily  . docusate sodium  200 mg Oral Daily  . feeding supplement (PRO-STAT SUGAR FREE 64)  30 mL Per Tube QID  . insulin aspart  1-3 Units Subcutaneous Q4H  . mouth rinse  15 mL Mouth Rinse 10 times per day  . methylPREDNISolone (SOLU-MEDROL) injection  125 mg Intravenous Q6H  . metoCLOPramide (REGLAN) injection  5 mg Intravenous Q8H  . metoprolol tartrate  12.5 mg Oral BID   Or  .  metoprolol tartrate  12.5 mg Per Tube BID  . mupirocin ointment   Nasal BID  . [START ON 06/01/2019] pantoprazole  40 mg Intravenous Q12H  . senna-docusate  1 tablet Oral BID  . sodium chloride flush  10-40 mL Intracatheter Q12H  . sodium chloride flush  3 mL Intravenous Q12H    have reviewed scheduled and prn medications.  Physical Exam: General: Intubated and sedated, critically ill Heart:RRR, s1s2 nl Lungs: Bibasal coarse breath sound, no wheezing Abdomen:soft, Non-tender, non-distended Extremities: Trace edema Dialysis Access: Left IJ catheter site clean.  Multiple line for ECMO.  Dron Prasad Bhandari 05/31/2019,8:58 AM  LOS: 15 days  Pager: [phone]

## 2019-05-31 NOTE — Progress Notes (Signed)
NAME:  Raymond Chavez, MRN:  [id number], DOB:  23-Aug-1950, LOS: 104 ADMISSION DATE:  05/01/2019, CONSULTATION DATE:  05/08/2019 REFERRING MD:  Billy Fischer  CHIEF COMPLAINT:  SOB   Brief History   Raymond Chavez is a 69 y.o. male who was admitted with acute hypoxic respiratory failure due to acute pulmonary edema in setting of NSTEMI, cardiogenic shock, possible PNA, He required intubation in the ED after failing BiPAP. On impella for hemodynamic support CVTS and cardiology are discussing about CABG, LVAD  Past Medical History  HTN, CAD.  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  10/27-heparin stopped due to low platelets.  HIT panel sent.  Starting bivalirudin.  Off Levophed.  On minimal vent support but failed weaning trials 10/28 Bronchoscopy  10/29 Started on CRRT 10/30 Bronchoscopy with mucous plug removal 11/1 Extubated 11/4 progressive shock, respiratory failure requiring reintubation.  Pulmonary hemorrhage, heparin held.  Antibiotics broadened empirically. 11/4 cannulated and [facility] ECMO initiated 1730 11/5 paralytics stopped   Consults:  Cardiology PCCM Nephrology   Procedures:  ETT 10/22 >11/1 Swan 10/22 > Impella 10/23 >> 10/24 Changed to 5.0 via right axillary >>  Vas cath 10/29  Aline 11/3  Significant Diagnostic Tests:  CTA chest 10/22 > no PE, b/l basilar consolidation, diffuse interlobular septal thickening with GGO's, b/l hilar adenopathy. Echo 11/2: LVEF 15%, impella in place 10/27 UE venous doppler: Right: No evidence of deep vein thrombosis in the upper extremity. However, unable to visualize the IJV, axillary. No evidence of superficial vein thrombosis in the upper extremity. However, unable to visualize the IJV, axillary. No evidence of thrombosis in the . However, unable to  visualize the IJV, axillary.   Left: No evidence of thrombosis in the subclavian.     Micro Data:  Blood 10/22, 10/24, 10/28, 10/29 >>> NGTD Sputum 10/24 >>> Neg Urine 10/22 >>>Neg Urine Strep > Neg RVP 10/22 > Neg MRSA PCR > Neg SARS CoV2 10/22 > neg. BAL 10/28 > Enterococcus faecalis (40 K) Blood 10/29: negative  Antimicrobials:  Vanc 10/22 >10/25, 10/28>10/31 Cefepime 10/22 >10/26, 10/29>11/1 Ampicillin 11/1>11/4 vanc 11/4-> Merrem11/4->  Interim history/subjective:  CVVHD running, currently  ECMO: 1.00, sweep 3 PRVC 400 x 10, 0.30 + 5 Heparin held 11/6, bloody chest tube output  Objective:  Blood pressure (!) 75/59, pulse 68, temperature 99 F (37.2 C), resp. rate 11, height _0  (1.702 m), weight 74.8 kg, SpO2 100 %. PAP: (11-40)/(8-23) 26/22 CVP:  [5 mmHg-13 mmHg] 5 mmHg  Vent Mode: PRVC FiO2 (%):  [30 %] 30 % Set Rate:  [10 bmp-18 bmp] 10 bmp Vt Set:  [400 mL] 400 mL PEEP:  [5 cmH20] 5 cmH20 Plateau Pressure:  [18 cmH20-21 cmH20] 19 cmH20   Intake/Output Summary (Last 24 hours) at 05/31/2019 0757 Last data filed at 05/31/2019 0700 Gross per 24 hour  Intake 5121.31 ml  Output 3315 ml  Net 1806.31 ml   Filed Weights   05/27/19 0329 05/28/19 0500 05/31/19 0500  Weight: 73 kg 72 kg 74.8 kg   Physical Exam: General: Critically ill-appearing man, intubated and sedated.  ECMO circuit in place, functioning HENT: ET tube in place, no blood in the vent circuit, no coffee grounds noted Eyes: Pupils equal, no icterus Respiratory: Coarse bilateral breath sounds, no wheezing Cardiovascular: Continuous mechanical noise present  GI: Nondistended, positive bowel sounds Extremities trace LE edema Neuro: Sedated, some grimace with stimulation, he is breathing over the set rate on MV  Assessment & Plan:   Acute hypoxic respiratory failure requiring intubation -multifactorial  Pulmonary hemorrhage, appears to have stabilized 11/5 Aspiration event, with presumed  pneumonia ARDS Extubated 11/2 and reintubated 11/4 am Plan  -ECMO running, pH adequate on current settings -Agree with beginning to lighten sedation, wake up assessment as long as respiratory pattern does not interrupt either ECMO or CVVH -PRVC 6 cc/kg, 0.30, 5, rate 10.  P plat 26 -Heparin held 11/6 due to bloody chest tube output  Cardiogenic Shock in setting of severe 3V Disease with depressed LV Dysfunction  H/O MI 1998, CAD  EF 15-20% with decreased RV systolic function  40/98 RHC/LHC which showed severe 3V CAD (LM 75%, LAD 100%, LCX 80%, RCA 80% prox) and severely depressed LV function Plan -Impella device location confirmed and in good position by echocardiogram 11/5 -Norepinephrine weaned to off -Tentative plan CABG next week based on stability -LDH appears to have plateaued, continuing to follow -Home antihypertensive regimen held (amlodipine, HCTZ, lisinopril)  Con-comitant Septic Shock secondary to Enterococcus Faecalis PNA, treated Aspiration event, probable recurrent pneumonia 11/4  Plan -Norepinephrine weaned off as above -Continue empiric meropenem, vancomycin (MRSA nasal swab positive), day 3 -Follow WBC, clinical status, chest x-ray   Acute Kidney Injury  Plan -On CVVHD, tolerating gentle volume removal (50-100 cc/h)  Acute on chronic normocytic anemia:  -Follow CBC, goal hemoglobin > 7.0 -Continue empiric PPI infusion for now.  Suspect that blood loss was pulmonary not GI Thrombocytopenia HIT panel negative -Goal PLT > 50  ?ileus:  -Continue bowel regimen  Goals of care:  Goals of care discussions ongoing depending on clinical progress  Best Practice:  Diet: npo Pain/Anxiety/Delirium protocol: Fentanyl, Versed infusions VAP protocol: yes DVT prophylaxis: SCD's  GI prophylaxis: PPI. Glucose control: SSI. Mobility: bedrest Code Status: Full. Family Communication: Ongoing Disposition: ICU.  Labs   CBC: Recent Labs  Lab 05/27/19 1651  05/28/19 0329  06/10/2019 0211  05/27/2019 2308  05/30/19 0550  05/30/19 0941  05/30/19 1239  05/30/19 1626  05/30/19 2204 05/30/19 2248 05/31/19 0308 05/31/19 0317 05/31/19 0554  WBC 26.8* 29.2*   < > 30.1*   < > 19.3*   < > 15.6*  --  14.2*  --  13.7*  --  16.0*  --   --   --  16.0*  --   --   NEUTROABS 20.5* 22.8*  --  22.3*  --  15.6*  --  12.9*  --   --   --   --   --   --   --   --   --   --   --   --   HGB 7.7* 8.7*   < > 5.9*   < > 11.0*   < > 9.0*   < > 8.0*   < > 7.9*   < > 7.5*   < > 8.0* 8.2* 7.0* 6.1* 7.1*  HCT 22.8* 26.8*   < > 18.0*   < > 31.7*   < > 26.4*   < > 23.5*   < > 22.9*   < > 21.6*   < > 23.3* 24.0* 20.7* 18.0* 21.0*  MCV 89.1 90.8   < > 91.4   < > 88.5   < > 90.1  --  90.0  --  89.5  --  90.8  --   --   --  92.0  --   --   PLT 95* 93*   < > 54*   < > 46*   < > 97*  --  76*  --  73*  --  76*  --   --   --  68*  --   --    < > = values in this interval not displayed.   Basic Metabolic Panel: Recent Labs  Lab 06/15/2019 0211  06/09/2019 1920  05/30/19 0305  05/30/19 0550  05/30/19 0903  05/30/19 1112  05/30/19 1626  05/30/19 2146 05/30/19 2248 05/31/19 0308 05/31/19 0317 05/31/19 0554  NA 139   < > 140   < > 139   < > 139   < >  --    < > 137   < > 138   < > 138 138 137 138 137  K 4.8   < > 5.7*   < > 5.0   < > 5.2*   < >  --    < > 4.7   < > 4.7   < > 4.4 4.7 4.7 4.7 4.8  CL 96*   < > 105   < > 105  --  106  --   --   --  106  --  106  --   --   --  107  --   --   CO2 23   < > 22   < > 25  --  25  --   --   --  23  --  25  --   --   --  24  --   --   GLUCOSE 138*   < > 160*   < > 110*  --  113*  --   --   --  145*  --  152*  --   --   --  184*  --   --   BUN 74*   < > 67*   < > 45*  --  42*  --   --   --  38*  --  37*  --   --   --  33*  --   --   CREATININE 3.13*   < > 2.68*   < > 1.98*  --  1.93*  --   --   --  1.82*  --  1.69*  --   --   --  1.45*  --   --   CALCIUM 6.7*   < > 6.2*   < > 6.9*  --  6.9*  --   --   --  6.8*  --  6.9*  --   --   --  6.8*  --    --   MG 2.4  --   --   --  2.4  --   --   --  2.4  --   --   --  2.5*  --   --   --  2.3  --   --   PHOS  --    < > 6.0*  --   --   --  6.2*  --  5.1*  --   --   --  4.5  --   --   --  3.6  --   --    < > = values in this interval not displayed.   GFR: Estimated Creatinine Clearance: 45 mL/min (A) (by C-G formula based on  SCr of 1.45 mg/dL (H)). Recent Labs  Lab 05/25/19 0341  05/28/19 0342  06/24/2019 1922  05/30/19 0425  05/30/19 0941 05/30/19 1239 05/30/19 1626 05/31/19 0308  PROCALCITON 0.45  --  1.26  --   --   --   --   --   --   --   --   --   WBC 19.0*   < >  --    < >  --    < >  --    < > 14.2* 13.7* 16.0* 16.0*  LATICACIDVEN  --   --   --   --  1.2  --  1.0  --   --   --  1.0 1.1   < > = values in this interval not displayed.   Liver Function Tests: Recent Labs  Lab 05/30/19 0305 05/30/19 0550 05/30/19 1112 05/30/19 1626 05/31/19 0308  AST 34 _0 ALT 38 38 _1 ALKPHOS 43 44 36* 37* 38  BILITOT 1.6* 1.7* 1.9* 1.3* 1.7*  PROT 4.4* 4.4* 4.1* 4.1* 4.1*  ALBUMIN 2.4* 2.4* 2.4* 2.2* 2.3*   No results for input(s): LIPASE, AMYLASE in the last 168 hours. No results for input(s): AMMONIA in the last 168 hours. ABG    Component Value Date/Time   PHART 7.444 05/31/2019 0554   PCO2ART 39.9 05/31/2019 0554   PO2ART 454.0 (H) 05/31/2019 0554   HCO3 27.3 05/31/2019 0554   TCO2 29 05/31/2019 0554   ACIDBASEDEF 1.0 06/13/2019 0255   O2SAT 85.1 05/31/2019 0604    Coagulation Profile: Recent Labs  Lab 06/21/2019 2308 05/30/19 0305 05/30/19 0550 05/30/19 1626 05/31/19 0308  INR 1.8* 1.5* 1.4* 1.5* 1.6*   Cardiac Enzymes: No results for input(s): CKTOTAL, CKMB, CKMBINDEX, TROPONINI in the last 168 hours. HbA1C: Hgb A1c MFr Bld  Date/Time Value Ref Range Status  05/01/2019 11:25 PM 5.5 4.8 - 5.6 % Final    Comment:    (NOTE) Pre diabetes:          5.7%-6.4% Diabetes:              >6.4% Glycemic control for   <7.0% adults with diabetes     CBG: Recent Labs  Lab 05/30/19 1610 05/30/19 2046 05/31/19 0013 05/31/19 0314 05/31/19 0746  GLUCAP 98 139* 156* 153* 177*    Independent critical care time 32 minutes  Baltazar Apo, MD, PhD 05/31/2019, 7:57 AM [facility] [phone] or if no answer [phone]

## 2019-05-31 NOTE — Progress Notes (Addendum)
Pharmacy CONSULT NOTE  Pharmacy Consult for heparin Indication for heparin: Impella 5.0 & ECMO  No Known Allergies  Patient Measurements: Height: 5\' 7"  (170.2 cm)(measured x3) Weight: 164 lb 14.5 oz (74.8 kg) IBW/kg (Calculated) : 66.1  Vital Signs: Temp: 97.2 F (36.2 C) (11/06 1600) Temp Source: Core (11/06 1600) Pulse Rate: 70 (11/06 1200)  Labs: Recent Labs    05/28/2019 1109  06/20/2019 2209  05/30/19 0903  05/30/19 1626  05/31/19 0308  05/31/19 0554 05/31/19 0927 05/31/19 1113 05/31/19 1414 05/31/19 1618  HGB  --    < >  --    < >  --    < > 7.5*   < > 7.0*   < > 7.1*  --  9.8*  --  8.9*  HCT  --    < >  --    < >  --    < > 21.6*   < > 20.7*   < > 21.0*  --  29.1*  --  25.6*  PLT  --    < >  --    < >  --    < > 76*  --  68*  --   --   --  45*  --  38*  APTT  --    < >  --    < > >200*   < > 48*   < > 40*  --   --  43*  --  44* 79*  LABPROT  --    < >  --    < >  --   --  17.5*  --  18.4*  --   --   --   --   --  19.5*  INR  --    < >  --    < >  --   --  1.5*  --  1.6*  --   --   --   --   --  1.7*  HEPARINUNFRC 0.13*  --  0.45  --  0.79*  --   --   --   --   --   --   --   --   --   --   CREATININE  --    < >  --    < >  --    < > 1.69*  --  1.45*  --   --   --   --   --  1.43*   < > = values in this interval not displayed.    Estimated Creatinine Clearance: 45.6 mL/min (A) (by C-G formula based on SCr of 1.43 mg/dL (H)).  Assessment: 64 yoM admitted with shock and concern for ACS now s/p Impella CP placement in cath lab. Impella CP swapped out for 5.0 d/t hemolysis. Underwent [facility] ECMO with goal of CABG for revascularization.   ECMO initiate about 1800 Wed- originally started on heparin at 1500 units/hr systemically, which was held for bleeding.   Peripheral  Heparin was restarted 11/6 about noon 100 uts/hr now titrated up 200 uts/hr for thrombosis prevention. Also given bolus of 1000 units around 1530  Currently impella purge flow is 14.1 mL/hr (705 units/hr)    Currently no blood out of CT - minimal blood when suctioning  Goal of Therapy:  APTT 50 - 70 sec Monitor platelets by anticoagulation protocol: Yes   Plan:  Continue heparin purge Continue heparin 200 units/hr - discussed with Dr 13/6 Repeat aPTT/Hep LVL  at [place], PharmD, BCPS, BCCCP Clinical Pharmacist [phone]  Please check AMION for all [place] numbers  05/31/2019 5:27 PM   UPDATE LATER IN EVENING  Labs Returned: Hep Lvl < 0.1, aPTT 56 - continuing to monitor aPTTs per MD. Has been consistently not correlating with each other INR 1.7 (up from 1.6), Fibrinogen WNL Hemoglobin and Platelets decreased - no further bleeding from CT  ECMO Flow rates have been good - currently at 3.4; no issues per RN and RT Impella purge has changed - was 16.5 mL/hr (825 units/hr) for 5 hours, now decreased to 15.4 mL/hr (770 units/hr)  Remains on IV heparin at 200 units/hr  Plan: Continue heparin purge Continue IV heparin 200 units/hr (aPTT within ordered goal) Repeat aPTT and HL at [place], PharmD, BCPS, BCCCP Clinical Pharmacist [phone]  Please check AMION for all [place] numbers  05/31/2019 9:40 PM

## 2019-05-31 NOTE — Progress Notes (Signed)
Ordered to draw co-ox from CVP port on swan. Port will not draw back. Per [name] MD, do not redraw co-ox d/t high results from ECMO.

## 2019-05-31 NOTE — Progress Notes (Signed)
Patient ID: Raymond Chavez, male   DOB: [birth year], 68 y.o.   MRN: [id number]    Advanced Heart Failure Rounding Note   Subjective:    Underwent placement of Impella 5.0 on 10/24 with removal of R femoral Impella CP.   Extubated 11/1, CVVH held on 11/3 given CVP 6-7 to see if renal function would recover.  He had a possible aspiration event afternoon 11/3 then became progressively hypoxemic during the night.  He had to be re-intubated.  Bronchoscopy showed copious blood return suggesting hemoptysis.  He was suctioned and given epinephrine down the tube and bleeding appeared to stop.  He developed PEA arrest during this time and underwent CPR with boluses of epinephrine and HCO3, total CPR time was 10-15 minutes. CVVH restarted.   Decision made 11/4 to initiate ECMO.  Patient went to OR, arterial cannula in ascending aorta and venous cannula in right femoral vein. Impella remains in place at P2.   This morning, MAP 70s off pressors.  Lactate 1.1.   ECMO numbers: 2900 rpm 3.1 L/min flow   Co-ox 85%  CVVH ongoing, net UF 60 cc/hr currently.     Enterococcus faecalis on BAL culture.  Patient is now on vancomycin/meropenem. CXR with diffuse pulmonary edema.    Platelets 97 today. HIT negative.  1 unit platelets overnight.   Increased chest tube output overnight, systemic heparin off (running in Impella purge only). Hgb 7 this morning, 2 units PRBCs given.   Swan: CVP 11 PA 19/12 Cannot obtain thermodilution CO  Sedated on vent.   Impella P-2 No flows due to damage of sensor during placement. Motor current stable. LDH 881 => 911 => 593 => 589 => 718 => 744 => 512 => 405.   RHC/LHC (10/23):  Coronary Findings  Diagnostic Dominance: Right Left Main  75% distal left main stenosis.  Left Anterior Descending  Occluded proximally, some late filling by collaterals.  Ramus Intermedius  Large vessel, patent with luminal irregularities.  Left Circumflex  80% mid LCx stenosis. Small  OM1 with 95% proximal stenosis. Large OM2 is subtotally occluded (fills late with TIMI 2 flow).  Right Coronary Artery  80% proximal stenosis. Serial 50% mid-vessel stenoses. PDA with only luminal irregularities. Large PLV with 80% mid-vessel stenosis.  Intervention  No interventions have been documented. Right Heart  Right Heart Pressures RHC Procedural Findings (mmHg): Hemodynamics RA mean 13 RV 70/17 PA 73/41, mean 53 PCWP mean 30 LV 140/40 AO 138/88  Oxygen saturations: PA 60% AO 97%  Cardiac Output (Fick) 2.95  Cardiac Index (Fick) 1.62 PVR 7.8 WU  Cardiac Output (Thermo) 3.24 Cardiac Index (Thermo) 1.78  PVR 7.1 WU  PAPI 2.46    Objective:   Weight Range:  Vital Signs:   Temp:  [98.1 F (36.7 C)-99.3 F (37.4 C)] 99 F (37.2 C) (11/06 0630) Pulse Rate:  [67-73] 68 (11/06 0334) Resp:  [10-22] 11 (11/06 0630) BP: (75-84)/(59-63) 75/59 (11/05 1527) SpO2:  [100 %] 100 % (11/06 0630) Arterial Line BP: (63-100)/(59-82) 92/78 (11/06 0630) FiO2 (%):  [30 %] 30 % (11/06 0334) Weight:  [74.8 kg] 74.8 kg (11/06 0500) Last BM Date: 05/26/2019  Weight change: Filed Weights   05/27/19 0329 05/28/19 0500 05/31/19 0500  Weight: 73 kg 72 kg 74.8 kg    Intake/Output:   Intake/Output Summary (Last 24 hours) at 05/31/2019 0735 Last data filed at 05/31/2019 0700 Gross per 24 hour  Intake 5121.31 ml  Output 3315 ml  Net 1806.31 ml  Physical Exam: CVP 11 General: intubated/sedated.  Arterial ECMO catheter upper chest, venous in right groin.  Neck: No JVD, no thyromegaly or thyroid nodule.  Lungs: Decreased BS at bases.  CV: Nondisplaced PMI.  Heart regular S1/S2, no S3/S4, no murmur.  1+ edema to knees.   Abdomen: Soft, nontender, no hepatosplenomegaly, no distention.  Skin: Intact without lesions or rashes.  Neurologic: Alert and oriented x 3.  Psych: Normal affect. Extremities: No clubbing or cyanosis.  HEENT: Normal.   Telemetry: NSR 90s no PVCs  (personally reviewed)  Labs: Basic Metabolic Panel: Recent Labs  Lab 06/02/2019 0211  06/01/2019 1920  05/30/19 0305  05/30/19 0550  05/30/19 0903  05/30/19 1112  05/30/19 1626  05/30/19 2146 05/30/19 2248 05/31/19 0308 05/31/19 0317 05/31/19 0554  NA 139   < > 140   < > 139   < > 139   < >  --    < > 137   < > 138   < > 138 138 137 138 137  K 4.8   < > 5.7*   < > 5.0   < > 5.2*   < >  --    < > 4.7   < > 4.7   < > 4.4 4.7 4.7 4.7 4.8  CL 96*   < > 105   < > 105  --  106  --   --   --  106  --  106  --   --   --  107  --   --   CO2 23   < > 22   < > 25  --  25  --   --   --  23  --  25  --   --   --  24  --   --   GLUCOSE 138*   < > 160*   < > 110*  --  113*  --   --   --  145*  --  152*  --   --   --  184*  --   --   BUN 74*   < > 67*   < > 45*  --  42*  --   --   --  38*  --  37*  --   --   --  33*  --   --   CREATININE 3.13*   < > 2.68*   < > 1.98*  --  1.93*  --   --   --  1.82*  --  1.69*  --   --   --  1.45*  --   --   CALCIUM 6.7*   < > 6.2*   < > 6.9*  --  6.9*  --   --   --  6.8*  --  6.9*  --   --   --  6.8*  --   --   MG 2.4  --   --   --  2.4  --   --   --  2.4  --   --   --  2.5*  --   --   --  2.3  --   --   PHOS  --    < > 6.0*  --   --   --  6.2*  --  5.1*  --   --   --  4.5  --   --   --  3.6  --   --    < > =  values in this interval not displayed.    Liver Function Tests: Recent Labs  Lab 05/30/19 0305 05/30/19 0550 05/30/19 1112 05/30/19 1626 05/31/19 0308  AST 34 _0 ALT 38 38 _1 ALKPHOS 43 44 36* 37* 38  BILITOT 1.6* 1.7* 1.9* 1.3* 1.7*  PROT 4.4* 4.4* 4.1* 4.1* 4.1*  ALBUMIN 2.4* 2.4* 2.4* 2.2* 2.3*   No results for input(s): LIPASE, AMYLASE in the last 168 hours. No results for input(s): AMMONIA in the last 168 hours.  CBC: Recent Labs  Lab 05/27/19 1651 05/28/19 0329  06/02/2019 0211  06/17/2019 2308  05/30/19 0550  05/30/19 0941  05/30/19 1239  05/30/19 1626  05/30/19 2204 05/30/19 2248 05/31/19 0308 05/31/19 0317  05/31/19 0554  WBC 26.8* 29.2*   < > 30.1*   < > 19.3*   < > 15.6*  --  14.2*  --  13.7*  --  16.0*  --   --   --  16.0*  --   --   NEUTROABS 20.5* 22.8*  --  22.3*  --  15.6*  --  12.9*  --   --   --   --   --   --   --   --   --   --   --   --   HGB 7.7* 8.7*   < > 5.9*   < > 11.0*   < > 9.0*   < > 8.0*   < > 7.9*   < > 7.5*   < > 8.0* 8.2* 7.0* 6.1* 7.1*  HCT 22.8* 26.8*   < > 18.0*   < > 31.7*   < > 26.4*   < > 23.5*   < > 22.9*   < > 21.6*   < > 23.3* 24.0* 20.7* 18.0* 21.0*  MCV 89.1 90.8   < > 91.4   < > 88.5   < > 90.1  --  90.0  --  89.5  --  90.8  --   --   --  92.0  --   --   PLT 95* 93*   < > 54*   < > 46*   < > 97*  --  76*  --  73*  --  76*  --   --   --  68*  --   --    < > = values in this interval not displayed.    Cardiac Enzymes: No results for input(s): CKTOTAL, CKMB, CKMBINDEX, TROPONINI in the last 168 hours.  BNP: BNP (last 3 results) Recent Labs    05/07/2019 1739 05/15/2019 1234  BNP 2,725.3* 3,370.0*    ProBNP (last 3 results) No results for input(s): PROBNP in the last 8760 hours.    Other results:  Imaging: Dg Abd 1 View  Result Date: 05/30/2019 CLINICAL DATA:  Fluoroscopic guided feeding tube placement. EXAM: ABDOMEN - 1 VIEW COMPARISON:  Radiograph 06/14/2019 FINDINGS: Single fluoroscopic spot image demonstrates a feeding tube tip in the region of the fourth portion the duodenum. IMPRESSION: Feeding tube tip at the ligament of Treitz. Electronically Signed   By: Marijo Sanes M.D.   On: 05/30/2019 13:11   Dg Chest Port 1 View  Result Date: 05/30/2019 CLINICAL DATA:  Endotracheal tube placement EXAM: PORTABLE CHEST 1 VIEW COMPARISON:  May 29, 2019 FINDINGS: The Impella device is stable in positioning. The Swan-Ganz catheter tip projects over the main pulmonary artery. The enteric tube appears to extend  below the left hemidiaphragm. Left-sided central venous catheter is stable in positioning. The endotracheal tube terminates just above the carina. The  heart size is unchanged. The patient is status post prior median sternotomy. Upper lobe predominant hazy airspace opacities are again noted. There are bilateral pleural effusions. The right-sided PICC line appears stable in positioning. IMPRESSION: 1. Lines and tubes as above. The endotracheal tube terminates just above the carina. Repositioning should be considered. 2. Persistent upper lobe predominant hazy airspace opacities favored to represent pulmonary edema. Electronically Signed   By: Constance Holster M.D.   On: 05/30/2019 07:00   Dg Chest Port 1 View  Result Date: 06/07/2019 CLINICAL DATA:  Post ECMO.  Hypertension. EXAM: PORTABLE CHEST 1 VIEW COMPARISON:  06/20/2019 FINDINGS: Previously seen support devices are stable. Presumed ECMO device now projects over the mediastinum. Bilateral airspace opacities are again noted, most pronounced in the upper lobes bilaterally. These are stable since prior study. No pneumothorax. No effusions. IMPRESSION: No significant change in the appearance of the lungs since prior study. Electronically Signed   By: Rolm Baptise M.D.   On: 06/23/2019 20:42   Dg Chest Port 1 View  Result Date: 05/31/2019 CLINICAL DATA:  Intubation, check support apparatus EXAM: PORTABLE CHEST 1 VIEW COMPARISON:  06/23/2019, 2 a.m. FINDINGS: No significant change in AP portable examination. Extensive support apparatus includes endotracheal tube, esophagogastric tube, left neck multi lumen vascular catheter, right neck pulmonary arterial catheter, right upper extremity approach Impella device, and right upper extremity PICC, all in unchanged position. Unchanged diffuse bilateral interstitial and heterogeneous pulmonary opacity. Defibrillator pad applied about the chest. IMPRESSION: 1. No significant change in AP portable examination. Extensive support apparatus includes endotracheal tube, esophagogastric tube, left neck multi lumen vascular catheter, right neck pulmonary arterial catheter,  right upper extremity approach Impella device, and right upper extremity PICC, all in unchanged position. 2. Unchanged diffuse bilateral interstitial and heterogeneous pulmonary opacity, consistent with edema, infection, and/or ARDS. Electronically Signed   By: Eddie Candle M.D.   On: 05/28/2019 14:42     Medications:     Scheduled Medications:  acetaminophen  1,000 mg Oral Q6H   Or   acetaminophen (TYLENOL) oral liquid 160 mg/5 mL  1,000 mg Per Tube Q6H   acetaminophen (TYLENOL) oral liquid 160 mg/5 mL  650 mg Per Tube Once   Or   acetaminophen  650 mg Rectal Once   arformoterol  15 mcg Nebulization BID   aspirin  81 mg Per Tube Daily   atorvastatin  80 mg Per Tube q1800   B-complex with vitamin C  1 tablet Per Tube Daily   bisacodyl  10 mg Oral Daily   Or   bisacodyl  10 mg Rectal Daily   chlorhexidine gluconate (MEDLINE KIT)  15 mL Mouth Rinse BID   Chlorhexidine Gluconate Cloth  6 each Topical Daily   docusate sodium  200 mg Oral Daily   feeding supplement (PRO-STAT SUGAR FREE 64)  30 mL Per Tube QID   insulin aspart  1-3 Units Subcutaneous Q4H   mouth rinse  15 mL Mouth Rinse 10 times per day   methylPREDNISolone (SOLU-MEDROL) injection  125 mg Intravenous Q6H   metoCLOPramide (REGLAN) injection  5 mg Intravenous Q8H   metoprolol tartrate  12.5 mg Oral BID   Or   metoprolol tartrate  12.5 mg Per Tube BID   [START ON 06/01/2019] pantoprazole  40 mg Intravenous Q12H   senna-docusate  1 tablet Oral BID   sodium  chloride flush  10-40 mL Intracatheter Q12H   sodium chloride flush  3 mL Intravenous Q12H    Infusions:   prismasol BGK 4/2.5 400 mL/hr at 05/30/19 2036    prismasol BGK 4/2.5 300 mL/hr at 05/30/19 2034   sodium chloride 10 mL/hr at 05/30/19 2100   sodium chloride Stopped (05/28/19 0127)   sodium chloride     sodium chloride 1 mL/hr at 06/22/2019 1900   albumin human     albumin human     cisatracurium (NIMBEX) infusion Stopped  (05/30/19 0844)   EPINEPHrine 4 mg in dextrose 5% 250 mL infusion (16 mcg/mL)     feeding supplement (VITAL 1.5 CAL) 30 mL/hr at 05/31/19 0500   fentaNYL infusion INTRAVENOUS 250 mcg/hr (05/31/19 0450)   impella catheter heparin 50 unit/mL in dextrose 5% 50,000 Units (05/23/19 0815)   magnesium sulfate     meropenem (MERREM) IV 1 g (05/31/19 0022)   midazolam 7 mg/hr (05/31/19 0242)   norepinephrine (LEVOPHED) Adult infusion 3 mcg/min (05/30/19 2100)   pantoprozole (PROTONIX) infusion 8 mg/hr (05/31/19 0134)   prismasol BGK 4/2.5 1,800 mL/hr at 05/31/19 0623   vancomycin Stopped (05/30/19 1115)    PRN Medications: sodium chloride, Place/Maintain arterial line **AND** sodium chloride, docusate, heparin, influenza vaccine adjuvanted, midazolam, morphine injection, ondansetron (ZOFRAN) IV, pneumococcal 23 valent vaccine, sodium chloride, sodium chloride flush, sodium chloride flush   Assessment/Plan:   1. Shock: Possible mixed cardiogenic/septic with fever and suspected PNA.  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  Echo with EF 20%, RV moderately HK.  Impella CP placed 10/23. Switched for Impella 5.0 on 10/24. No flow meter on Impella due to damage to sensor on insertion. Patient has LBBB.  PEA arrest 11/3, ECMO started 11/4.  This morning, ECMO at 2900 rpm with flow 3.1 L/min.  Impella is at P2.  MAP 70s, co-ox 85%.  Currently off pressors, CVVH net UF 50 cc/hr - Continue ECMO, flow adequate.  Systemic heparin off with increased chest tube output, only heparin in Impella purge.  - Pull gently via CVVH, aim 50-100 cc/hr net UF as CVP tolerates.   - Continue Impella at P2 for now, good position under echo yesterday.  LDH lower.  2. CAD:  NSTEMI, hs-TnI 16,000. 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  - continue ASA/statin. No b-blocker with shock - Goal is eventual CABG at some point next week, discussed with Dr.  [name].   3. AKI: Baseline creatinine 1.3, now AKI likely due to ATN from shock and contrast (had CTA for PE at admission, only 25 cc contrast with cath).  CVVH ongoing.  - Continue CVVH, today pull fluid gently as above.   4. Acute Respiratory Failure: Has had PNA with Enterococcus faecalis in BAL cultures. Now re-intubated.  He had PEA arrest with possible aspiration 11/3 and also had pulmonary hemorrhage.  Heparin was stopped systemically and patient had 3 units PRBCs. CXR with bilateral upper lobe airspace opacities.  Hemoptysis appears to have stopped.  - CVVH restarted as above.  - Continue vancomycin/meropenem.  5. ID: Suspected PNA, CXR with left base density. Enterococcus faecalis in BAL cultures.  Now afebrile and WBCs 16.  - As above, with possible aspiration/pulmonary hemorrhage/PEA arrest, started vancomycin/meropenem.    6. LBBB: Unsure chronicity.  7. Anemia: Hgb 7 today, got 2 units PRBCs.  8. Thrombocytopenia: HIT negative  ?Low due to sepsis  versus hemolysis. 1 unit plts overnight.    9. PVCs/NSVT: Quiescent, amiodarone was stopped.  10. FEN: Cortrak,getting post-pyloric TFs.   CRITICAL CARE Performed by: Loralie Champagne  Total critical care time: 45 minutes  Critical care time was exclusive of separately billable procedures and treating other patients.  Critical care was necessary to treat or prevent imminent or life-threatening deterioration.  Critical care was time spent personally by me on the following activities: development of treatment plan with patient and/or surrogate as well as nursing, discussions with consultants, evaluation of patient's response to treatment, examination of patient, obtaining history from patient or surrogate, ordering and performing treatments and interventions, ordering and review of laboratory studies, ordering and review of radiographic studies, pulse oximetry and re-evaluation of patient's condition.  Loralie Champagne 05/31/2019 7:35 AM

## 2019-05-31 NOTE — Progress Notes (Signed)
Cortrak Team Note  Small bore feeding tube placed yesterday. Cortrak team requested to bridle tube. Imaging report shows tube at 125 cm at the nare and tip of tube at the LOT. Tube bridled and remains at 125 cm.     Jarome Matin, MS, RD, LDN, [facility] Inpatient Clinical Dietitian Pager # [phone] After hours/weekend pager # [phone]

## 2019-05-31 NOTE — Progress Notes (Signed)
2 Days Post-Op Procedure(s) (LRB): CANNULATION FOR ECMO (EXTRACORPOREAL MEMBRANE OXYGENATION) (N/A) TRANSESOPHAGEAL ECHOCARDIOGRAM (TEE) (N/A) Subjective: Unable to assess  Objective: Vital signs in last 24 hours: Temp:  [98.1 F (36.7 C)-99.3 F (37.4 C)] 99.1 F (37.3 C) (11/06 0845) Pulse Rate:  [67-73] 70 (11/06 0800) Cardiac Rhythm: Normal sinus rhythm (11/06 0800) Resp:  [10-22] 11 (11/06 0845) BP: (75)/(59-63) 75/59 (11/05 1527) SpO2:  [99 %-100 %] 100 % (11/06 0845) Arterial Line BP: (63-100)/(60-82) 82/70 (11/06 0845) FiO2 (%):  [30 %] 30 % (11/06 0800) Weight:  [74.8 kg] 74.8 kg (11/06 0500)  Hemodynamic parameters for last 24 hours: PAP: (11-40)/(8-23) 18/13 CVP:  [5 mmHg-13 mmHg] 9 mmHg  Intake/Output from previous day: 11/05 0701 - 11/06 0700 In: 5121.3 [I.V.:1652.9; Blood:1527.4; NG/GT:430; IV Piggyback:1186.8] Out: 3315 [Urine:30; Chest Tube:700] Intake/Output this shift: Total I/O In: 128.1 [I.V.:66; Other:14.1; NG/GT:30; IV Piggyback:18] Out: 156 [Urine:5; Other:141; Chest Tube:10]  General appearance: sedated, comfortable Neurologic: unable to assess Heart: regular rate and rhythm, S1, S2 normal, no murmur, click, rub or gallop Lungs: rales bilaterally Abdomen: soft, non-tender; bowel sounds normal; no masses,  no organomegaly Extremities: edema 3+ Wound: dressed, dry  Lab Results: Recent Labs    05/30/19 1626  05/31/19 0308 05/31/19 0317 05/31/19 0554  WBC 16.0*  --  16.0*  --   --   HGB 7.5*   < > 7.0* 6.1* 7.1*  HCT 21.6*   < > 20.7* 18.0* 21.0*  PLT 76*  --  68*  --   --    < > = values in this interval not displayed.   BMET:  Recent Labs    05/30/19 1626  05/31/19 0308 05/31/19 0317 05/31/19 0554  NA 138   < > 137 138 137  K 4.7   < > 4.7 4.7 4.8  CL 106  --  107  --   --   CO2 25  --  24  --   --   GLUCOSE 152*  --  184*  --   --   BUN 37*  --  33*  --   --   CREATININE 1.69*  --  1.45*  --   --   CALCIUM 6.9*  --  6.8*  --    --    < > = values in this interval not displayed.    PT/INR:  Recent Labs    05/31/19 0308  LABPROT 18.4*  INR 1.6*   ABG    Component Value Date/Time   PHART 7.444 05/31/2019 0554   HCO3 27.3 05/31/2019 0554   TCO2 29 05/31/2019 0554   ACIDBASEDEF 1.0 05/28/2019 0255   O2SAT 85.1 05/31/2019 0604   CBG (last 3)  Recent Labs    05/31/19 0013 05/31/19 0314 05/31/19 0746  GLUCAP 156* 153* 177*    Assessment/Plan: S/P Procedure(s) (LRB): CANNULATION FOR ECMO (EXTRACORPOREAL MEMBRANE OXYGENATION) (N/A) TRANSESOPHAGEAL ECHOCARDIOGRAM (TEE) (N/A) Try to diurese through CVVHD circuit Agree with lightening sedation Continue extracorporeal circulatory support  LOS: 15 days    Raymond Chavez 05/31/2019

## 2019-05-31 NOTE — Progress Notes (Addendum)
ES spoke with DR. Atkins in regards to an increase in Delta P from 39 to 63. All other measurements are within normal limits. Orders are to transfuse 1 unit of PRBC for Hg less than 8, followed with 100 ml of albumin. No further orders. ES will continue to monitor.

## 2019-05-31 NOTE — TOC Progression Note (Signed)
Transition of Care [facility]) - Progression Note    Patient Details  Name: Raymond Chavez MRN: [id number] Date of Birth: August 19, 1950  Transition of Care [facility]) CM/SW Contact  Graves-Bigelow, Ocie Cornfield, RN Phone Number: 05/31/2019, 3:57 PM  Clinical Narrative:  Cardiogenic shock-acute hypoxemic respiratory failure. Continues on CRRT. Question CABG next week. CM will continue to follow for transition of care needs as the patient progresses.     Expected Discharge Plan: [place] Barriers to Discharge: Continued Medical Work up  Expected Discharge Plan and Services Expected Discharge Plan: [place]   Discharge Planning Services: CM Consult   Living arrangements for the past 2 months: Apartment                   Social Determinants of Health (SDOH) Interventions    Readmission Risk Interventions Readmission Risk Prevention Plan 05/23/2019  Transportation Screening Complete  PCP or Specialist Appt within 3-5 Days Not Complete  Not Complete comments Remains critically ill; not stable for discharge  [place] or [place] Complete  Social Work Consult for [place] Planning/Counseling Complete  Palliative Care Screening Not Applicable  Medication Review [employer] photographer) Complete  Some recent data might be hidden

## 2019-05-31 NOTE — Addendum Note (Signed)
Addendum  created 05/31/19 1013 by Sammie Bench, CRNA   Order list changed

## 2019-06-01 ENCOUNTER — Inpatient Hospital Stay (HOSPITAL_COMMUNITY): Payer: PPO

## 2019-06-01 DIAGNOSIS — I5043 Acute on chronic combined systolic (congestive) and diastolic (congestive) heart failure: Secondary | ICD-10-CM

## 2019-06-01 LAB — TYPE AND SCREEN
ABO/RH(D): O POS
Antibody Screen: NEGATIVE
Unit division: 0
Unit division: 0
Unit division: 0
Unit division: 0
Unit division: 0
Unit division: 0
Unit division: 0
Unit division: 0
Unit division: 0
Unit division: 0
Unit division: 0
Unit division: 0
Unit division: 0
Unit division: 0
Unit division: 0
Unit division: 0
Unit division: 0

## 2019-06-01 LAB — BPAM RBC
Blood Product Expiration Date: 202011282359
Blood Product Expiration Date: 202011282359
Blood Product Expiration Date: 202012052359
Blood Product Expiration Date: 202012052359
Blood Product Expiration Date: 202012052359
Blood Product Expiration Date: 202012052359
Blood Product Expiration Date: 202012052359
Blood Product Expiration Date: 202012052359
Blood Product Expiration Date: 202012052359
Blood Product Expiration Date: 202012052359
Blood Product Expiration Date: 202012062359
Blood Product Expiration Date: 202012062359
Blood Product Expiration Date: 202012062359
Blood Product Expiration Date: 202012082359
Blood Product Expiration Date: 202012092359
Blood Product Expiration Date: 202012092359
Blood Product Expiration Date: 202012102359
ISSUE DATE / TIME: 202011031349
ISSUE DATE / TIME: 202011040155
ISSUE DATE / TIME: 202011040205
ISSUE DATE / TIME: 202011040205
ISSUE DATE / TIME: 202011041551
ISSUE DATE / TIME: 202011041807
ISSUE DATE / TIME: 202011041939
ISSUE DATE / TIME: 202011041939
ISSUE DATE / TIME: 202011042124
ISSUE DATE / TIME: 202011042124
ISSUE DATE / TIME: 202011051732
ISSUE DATE / TIME: 202011051823
ISSUE DATE / TIME: 202011060415
ISSUE DATE / TIME: 202011060415
ISSUE DATE / TIME: 202011070433
ISSUE DATE / TIME: 202011070759
ISSUE DATE / TIME: 202011070759
Unit Type and Rh: 5100
Unit Type and Rh: 5100
Unit Type and Rh: 5100
Unit Type and Rh: 5100
Unit Type and Rh: 5100
Unit Type and Rh: 5100
Unit Type and Rh: 5100
Unit Type and Rh: 5100
Unit Type and Rh: 5100
Unit Type and Rh: 5100
Unit Type and Rh: 5100
Unit Type and Rh: 5100
Unit Type and Rh: 5100
Unit Type and Rh: 5100
Unit Type and Rh: 5100
Unit Type and Rh: 5100
Unit Type and Rh: 5100

## 2019-06-01 LAB — LACTIC ACID, PLASMA
Lactic Acid, Venous: 1 mmol/L (ref 0.5–1.9)
Lactic Acid, Venous: 0.9 mmol/L (ref 0.5–1.9)

## 2019-06-01 LAB — POCT I-STAT 7, (LYTES, BLD GAS, ICA,H+H)
Acid-Base Excess: 11 mmol/L — ABNORMAL HIGH (ref 0.0–2.0)
Acid-Base Excess: 3 mmol/L — ABNORMAL HIGH (ref 0.0–2.0)
Acid-Base Excess: 4 mmol/L — ABNORMAL HIGH (ref 0.0–2.0)
Acid-Base Excess: 4 mmol/L — ABNORMAL HIGH (ref 0.0–2.0)
Acid-Base Excess: 4 mmol/L — ABNORMAL HIGH (ref 0.0–2.0)
Acid-Base Excess: 6 mmol/L — ABNORMAL HIGH (ref 0.0–2.0)
Acid-Base Excess: 6 mmol/L — ABNORMAL HIGH (ref 0.0–2.0)
Bicarbonate: 27.6 mmol/L (ref 20.0–28.0)
Bicarbonate: 27.6 mmol/L (ref 20.0–28.0)
Bicarbonate: 29.1 mmol/L — ABNORMAL HIGH (ref 20.0–28.0)
Bicarbonate: 29.2 mmol/L — ABNORMAL HIGH (ref 20.0–28.0)
Bicarbonate: 29.3 mmol/L — ABNORMAL HIGH (ref 20.0–28.0)
Bicarbonate: 29.8 mmol/L — ABNORMAL HIGH (ref 20.0–28.0)
Bicarbonate: 32.9 mmol/L — ABNORMAL HIGH (ref 20.0–28.0)
Calcium, Ion: 0.99 mmol/L — ABNORMAL LOW (ref 1.15–1.40)
Calcium, Ion: 1 mmol/L — ABNORMAL LOW (ref 1.15–1.40)
Calcium, Ion: 1.02 mmol/L — ABNORMAL LOW (ref 1.15–1.40)
Calcium, Ion: 1.02 mmol/L — ABNORMAL LOW (ref 1.15–1.40)
Calcium, Ion: 1.02 mmol/L — ABNORMAL LOW (ref 1.15–1.40)
Calcium, Ion: 1.04 mmol/L — ABNORMAL LOW (ref 1.15–1.40)
Calcium, Ion: 1.05 mmol/L — ABNORMAL LOW (ref 1.15–1.40)
HCT: 21 % — ABNORMAL LOW (ref 39.0–52.0)
HCT: 22 % — ABNORMAL LOW (ref 39.0–52.0)
HCT: 23 % — ABNORMAL LOW (ref 39.0–52.0)
HCT: 23 % — ABNORMAL LOW (ref 39.0–52.0)
HCT: 24 % — ABNORMAL LOW (ref 39.0–52.0)
HCT: 28 % — ABNORMAL LOW (ref 39.0–52.0)
HCT: 29 % — ABNORMAL LOW (ref 39.0–52.0)
Hemoglobin: 7.1 g/dL — ABNORMAL LOW (ref 13.0–17.0)
Hemoglobin: 7.5 g/dL — ABNORMAL LOW (ref 13.0–17.0)
Hemoglobin: 7.8 g/dL — ABNORMAL LOW (ref 13.0–17.0)
Hemoglobin: 7.8 g/dL — ABNORMAL LOW (ref 13.0–17.0)
Hemoglobin: 8.2 g/dL — ABNORMAL LOW (ref 13.0–17.0)
Hemoglobin: 9.5 g/dL — ABNORMAL LOW (ref 13.0–17.0)
Hemoglobin: 9.9 g/dL — ABNORMAL LOW (ref 13.0–17.0)
O2 Saturation: 100 %
O2 Saturation: 100 %
O2 Saturation: 100 %
O2 Saturation: 100 %
O2 Saturation: 100 %
O2 Saturation: 100 %
O2 Saturation: 100 %
Patient temperature: 37
Patient temperature: 37.2
Patient temperature: 97.3
Patient temperature: 98.1
Patient temperature: 98.2
Patient temperature: 98.4
Patient temperature: 98.4
Potassium: 4.5 mmol/L (ref 3.5–5.1)
Potassium: 4.5 mmol/L (ref 3.5–5.1)
Potassium: 4.6 mmol/L (ref 3.5–5.1)
Potassium: 4.6 mmol/L (ref 3.5–5.1)
Potassium: 4.6 mmol/L (ref 3.5–5.1)
Potassium: 4.7 mmol/L (ref 3.5–5.1)
Potassium: 4.8 mmol/L (ref 3.5–5.1)
Sodium: 137 mmol/L (ref 135–145)
Sodium: 137 mmol/L (ref 135–145)
Sodium: 138 mmol/L (ref 135–145)
Sodium: 138 mmol/L (ref 135–145)
Sodium: 138 mmol/L (ref 135–145)
Sodium: 138 mmol/L (ref 135–145)
Sodium: 139 mmol/L (ref 135–145)
TCO2: 29 mmol/L (ref 22–32)
TCO2: 29 mmol/L (ref 22–32)
TCO2: 30 mmol/L (ref 22–32)
TCO2: 30 mmol/L (ref 22–32)
TCO2: 31 mmol/L (ref 22–32)
TCO2: 31 mmol/L (ref 22–32)
TCO2: 34 mmol/L — ABNORMAL HIGH (ref 22–32)
pCO2 arterial: 33.5 mmHg (ref 32.0–48.0)
pCO2 arterial: 34.7 mmHg (ref 32.0–48.0)
pCO2 arterial: 36 mmHg (ref 32.0–48.0)
pCO2 arterial: 36.5 mmHg (ref 32.0–48.0)
pCO2 arterial: 39.8 mmHg (ref 32.0–48.0)
pCO2 arterial: 45.8 mmHg (ref 32.0–48.0)
pCO2 arterial: 50.6 mmHg — ABNORMAL HIGH (ref 32.0–48.0)
pH, Arterial: 7.38 (ref 7.350–7.450)
pH, Arterial: 7.411 (ref 7.350–7.450)
pH, Arterial: 7.446 (ref 7.350–7.450)
pH, Arterial: 7.507 — ABNORMAL HIGH (ref 7.350–7.450)
pH, Arterial: 7.51 — ABNORMAL HIGH (ref 7.350–7.450)
pH, Arterial: 7.517 — ABNORMAL HIGH (ref 7.350–7.450)
pH, Arterial: 7.6 — ABNORMAL HIGH (ref 7.350–7.450)
pO2, Arterial: 429 mmHg — ABNORMAL HIGH (ref 83.0–108.0)
pO2, Arterial: 465 mmHg — ABNORMAL HIGH (ref 83.0–108.0)
pO2, Arterial: 499 mmHg — ABNORMAL HIGH (ref 83.0–108.0)
pO2, Arterial: 501 mmHg — ABNORMAL HIGH (ref 83.0–108.0)
pO2, Arterial: 514 mmHg — ABNORMAL HIGH (ref 83.0–108.0)
pO2, Arterial: 528 mmHg — ABNORMAL HIGH (ref 83.0–108.0)
pO2, Arterial: 536 mmHg — ABNORMAL HIGH (ref 83.0–108.0)

## 2019-06-01 LAB — CBC
HCT: 22.3 % — ABNORMAL LOW (ref 39.0–52.0)
HCT: 29.4 % — ABNORMAL LOW (ref 39.0–52.0)
Hemoglobin: 7.6 g/dL — ABNORMAL LOW (ref 13.0–17.0)
Hemoglobin: 10.2 g/dL — ABNORMAL LOW (ref 13.0–17.0)
MCH: 31.5 pg (ref 26.0–34.0)
MCH: 31.5 pg (ref 26.0–34.0)
MCHC: 34.1 g/dL (ref 30.0–36.0)
MCHC: 34.7 g/dL (ref 30.0–36.0)
MCV: 92.5 fL (ref 80.0–100.0)
MCV: 90.7 fL (ref 80.0–100.0)
Platelets: 22 10*3/uL — CL (ref 150–400)
Platelets: 38 10*3/uL — ABNORMAL LOW (ref 150–400)
RBC: 2.41 MIL/uL — ABNORMAL LOW (ref 4.22–5.81)
RBC: 3.24 MIL/uL — ABNORMAL LOW (ref 4.22–5.81)
RDW: 15 % (ref 11.5–15.5)
RDW: 14.8 % (ref 11.5–15.5)
WBC: 20.6 10*3/uL — ABNORMAL HIGH (ref 4.0–10.5)
WBC: 19.3 10*3/uL — ABNORMAL HIGH (ref 4.0–10.5)
nRBC: 0.3 % — ABNORMAL HIGH (ref 0.0–0.2)
nRBC: 0.3 % — ABNORMAL HIGH (ref 0.0–0.2)

## 2019-06-01 LAB — GLUCOSE, CAPILLARY
Glucose-Capillary: 154 mg/dL — ABNORMAL HIGH (ref 70–99)
Glucose-Capillary: 160 mg/dL — ABNORMAL HIGH (ref 70–99)
Glucose-Capillary: 179 mg/dL — ABNORMAL HIGH (ref 70–99)
Glucose-Capillary: 184 mg/dL — ABNORMAL HIGH (ref 70–99)
Glucose-Capillary: 207 mg/dL — ABNORMAL HIGH (ref 70–99)
Glucose-Capillary: 88 mg/dL (ref 70–99)

## 2019-06-01 LAB — COMPREHENSIVE METABOLIC PANEL
ALT: 24 U/L (ref 0–44)
ALT: 22 U/L (ref 0–44)
AST: 40 U/L (ref 15–41)
AST: 43 U/L — ABNORMAL HIGH (ref 15–41)
Albumin: 2.4 g/dL — ABNORMAL LOW (ref 3.5–5.0)
Albumin: 2.3 g/dL — ABNORMAL LOW (ref 3.5–5.0)
Alkaline Phosphatase: 49 U/L (ref 38–126)
Alkaline Phosphatase: 62 U/L (ref 38–126)
Anion gap: 8 (ref 5–15)
Anion gap: 9 (ref 5–15)
BUN: 32 mg/dL — ABNORMAL HIGH (ref 8–23)
BUN: 30 mg/dL — ABNORMAL HIGH (ref 8–23)
CO2: 24 mmol/L (ref 22–32)
CO2: 24 mmol/L (ref 22–32)
Calcium: 7.2 mg/dL — ABNORMAL LOW (ref 8.9–10.3)
Calcium: 7.1 mg/dL — ABNORMAL LOW (ref 8.9–10.3)
Chloride: 105 mmol/L (ref 98–111)
Chloride: 107 mmol/L (ref 98–111)
Creatinine, Ser: 1.35 mg/dL — ABNORMAL HIGH (ref 0.61–1.24)
Creatinine, Ser: 1.26 mg/dL — ABNORMAL HIGH (ref 0.61–1.24)
GFR calc Af Amer: >60 mL/min (ref >60)
GFR calc Af Amer: >60 mL/min (ref >60)
GFR calc non Af Amer: 53 mL/min — ABNORMAL LOW (ref >60)
GFR calc non Af Amer: 58 mL/min — ABNORMAL LOW (ref >60)
Glucose, Bld: 210 mg/dL — ABNORMAL HIGH (ref 70–99)
Glucose, Bld: 176 mg/dL — ABNORMAL HIGH (ref 70–99)
Potassium: 4.6 mmol/L (ref 3.5–5.1)
Potassium: 4.8 mmol/L (ref 3.5–5.1)
Sodium: 137 mmol/L (ref 135–145)
Sodium: 140 mmol/L (ref 135–145)
Total Bilirubin: 2.3 mg/dL — ABNORMAL HIGH (ref 0.3–1.2)
Total Bilirubin: 2.1 mg/dL — ABNORMAL HIGH (ref 0.3–1.2)
Total Protein: 4.4 g/dL — ABNORMAL LOW (ref 6.5–8.1)
Total Protein: 4.6 g/dL — ABNORMAL LOW (ref 6.5–8.1)

## 2019-06-01 LAB — PREPARE RBC (CROSSMATCH)

## 2019-06-01 LAB — COOXEMETRY PANEL
Carboxyhemoglobin: 2.6 % — ABNORMAL HIGH (ref 0.5–1.5)
Methemoglobin: 1.1 % (ref 0.0–1.5)
O2 Saturation: 65.1 %
Total hemoglobin: 9.4 g/dL — ABNORMAL LOW (ref 12.0–16.0)

## 2019-06-01 LAB — MAGNESIUM
Magnesium: 2.6 mg/dL — ABNORMAL HIGH (ref 1.7–2.4)
Magnesium: 2.4 mg/dL (ref 1.7–2.4)

## 2019-06-01 LAB — APTT
aPTT: 58 seconds — ABNORMAL HIGH (ref 24–36)
aPTT: 68 seconds — ABNORMAL HIGH (ref 24–36)
aPTT: 50 seconds — ABNORMAL HIGH (ref 24–36)
aPTT: 57 seconds — ABNORMAL HIGH (ref 24–36)

## 2019-06-01 LAB — PROTIME-INR
INR: 1.9 — ABNORMAL HIGH (ref 0.8–1.2)
INR: 1.6 — ABNORMAL HIGH (ref 0.8–1.2)
Prothrombin Time: 21.1 seconds — ABNORMAL HIGH (ref 11.4–15.2)
Prothrombin Time: 18.9 seconds — ABNORMAL HIGH (ref 11.4–15.2)

## 2019-06-01 LAB — VANCOMYCIN, TROUGH: Vancomycin Tr: 14 ug/mL — ABNORMAL LOW (ref 15–20)

## 2019-06-01 LAB — FIBRINOGEN
Fibrinogen: 439 mg/dL (ref 210–475)
Fibrinogen: 540 mg/dL — ABNORMAL HIGH (ref 210–475)

## 2019-06-01 LAB — HEPARIN LEVEL (UNFRACTIONATED): Heparin Unfractionated: <0.1 IU/mL — ABNORMAL LOW (ref 0.30–0.70)

## 2019-06-01 LAB — LACTATE DEHYDROGENASE: LDH: 738 U/L — ABNORMAL HIGH (ref 98–192)

## 2019-06-01 MED ORDER — AMIODARONE HCL IN DEXTROSE 360-4.14 MG/200ML-% IV SOLN
30.0000 mg/h | INTRAVENOUS | Status: AC
  Administered 2019-06-01: 30 mg/h via INTRAVENOUS
  Filled 2019-06-01: qty 200

## 2019-06-01 MED ORDER — SODIUM CHLORIDE 0.9% IV SOLUTION
Freq: Once | INTRAVENOUS | Status: AC
  Administered 2019-06-01: 09:00:00 via INTRAVENOUS

## 2019-06-01 MED ORDER — DORNASE ALFA 2.5 MG/2.5ML IN SOLN: 1.2500 mg | Freq: Two times a day (BID) | RESPIRATORY_TRACT | Status: DC

## 2019-06-01 MED ORDER — SODIUM CHLORIDE 0.9% IV SOLUTION
Freq: Once | INTRAVENOUS | Status: AC
  Administered 2019-06-01: 06:00:00 via INTRAVENOUS

## 2019-06-01 MED ORDER — HEPARIN BOLUS VIA INFUSION
2000.0000 [IU] | Freq: Once | INTRAVENOUS | Status: AC
  Administered 2019-06-01: 2000 [IU] via INTRAVENOUS

## 2019-06-01 MED ORDER — METHYLPREDNISOLONE SODIUM SUCC 125 MG IJ SOLR
60.0000 mg | Freq: Four times a day (QID) | INTRAMUSCULAR | Status: AC
  Administered 2019-06-01 (×4): 60 mg via INTRAVENOUS
  Filled 2019-06-01 (×4): qty 2

## 2019-06-01 MED ORDER — SODIUM CHLORIDE 0.9% IV SOLUTION
Freq: Once | INTRAVENOUS | Status: AC
  Administered 2019-06-01: 22:00:00 via INTRAVENOUS

## 2019-06-01 MED ORDER — SODIUM CHLORIDE 0.9% IV SOLUTION: Freq: Once | INTRAVENOUS | Status: DC

## 2019-06-01 MED ORDER — SODIUM CHLORIDE 0.9% IV SOLUTION
Freq: Once | INTRAVENOUS | Status: AC
  Administered 2019-06-01: 05:00:00 via INTRAVENOUS

## 2019-06-01 MED ORDER — TOBRAMYCIN 300 MG/5ML IN NEBU: 300.0000 mg | INHALATION_SOLUTION | Freq: Two times a day (BID) | RESPIRATORY_TRACT | Status: DC

## 2019-06-01 MED ORDER — ALBUMIN HUMAN 5 % IV SOLN
12.5000 g | Freq: Once | INTRAVENOUS | Status: AC
  Administered 2019-06-01: 12.5 g via INTRAVENOUS

## 2019-06-01 MED ORDER — GUAIFENESIN 100 MG/5ML PO SOLN
30.0000 mL | Freq: Two times a day (BID) | ORAL | Status: DC
  Administered 2019-06-01 – 2019-06-08 (×12): 600 mg
  Filled 2019-06-01 (×3): qty 15
  Filled 2019-06-01: qty 30
  Filled 2019-06-01 (×9): qty 15

## 2019-06-01 NOTE — Progress Notes (Signed)
2115 Anesthesia to call TEG result to Dr. [name] 2125 update to Dr. [name] on patient status, recent labs, suction event, decreased CVP and pulsatility on aline, orders received for FFP and Plt transfusion, plan of care discussed and communicated with bedside RN, important to pull at least 50/hr with CRRT, will continue to monitor patient

## 2019-06-01 NOTE — Progress Notes (Signed)
[facility] NEPHROLOGY PROGRESS NOTE  Assessment/ Plan: Pt is a 68 y.o. yo male with cardiogenic shock who underwent placement of Impella on 10/24, on milrinone and Levophed, cardiac cath with three-vessel disease and echocardiogram revealing EF of 20%, respiratory failure, consulted for AKI.  Baseline creatinine level around 1.3.  #Acute kidney injury on CKD: due to cardiorenal syndrome and contrast nephropathy. CRRT since 10/30.  Patient had cardiac arrest on 11/3.  -Continue CRRT.  Patient with net positive therefore attempt goal UF 50 to 100 cc/h.  4.6.  Continue 4K bath.  On IV heparin.  Discussed with ICU team.    #Cardiac arrest/cardiogenic shock: EF 20%, has Impella and now on ECMO.  Had  cardiac arrest on 11/3.   Heart failure team is following.    #Acute respiratory failure with hypoxia: Due to CHF and pneumonia. On mechanical ventilation and ECMO.  #CAD: NSTEMI, CABG with severe three-vessel disease.  On aspirin statin.    #Hypophosphatemia: Phosphorus level acceptable.  Follow-up lab.  Discussed with ICU team.  Subjective: Seen and examined at bedside.  Patient has ECMO, Impella and on mechanical ventilation.  Sedated.  No new event.  CRRT running well. Objective Vital signs in last 24 hours: Vitals:   06/01/19 0625 06/01/19 0630 06/01/19 0645 06/01/19 0700  BP:      Pulse:      Resp: _0 Temp:   98.4 F (36.9 C)   TempSrc:      SpO2: 100% 100% 100% 100%  Weight:      Height:       Weight change: 1.5 kg  Intake/Output Summary (Last 24 hours) at 06/01/2019 0817 Last data filed at 06/01/2019 6734 Gross per 24 hour  Intake 4107.52 ml  Output 5092 ml  Net -984.48 ml       Labs: Basic Metabolic Panel: Recent Labs  Lab 05/30/19 1626  05/31/19 0308  05/31/19 1618  05/31/19 2233  06/01/19 0128 06/01/19 0316 06/01/19 0610  NA 138   < > 137   < > 136   < > 135   < > 137 137 138  K 4.7   < > 4.7   < > 4.6   < > 4.5   < > 4.5 4.6 4.7   CL 106  --  107  --  103  --  104  --   --  105  --   CO2 25  --  24  --  23  --  24  --   --  24  --   GLUCOSE 152*  --  184*  --  172*  --  181*  --   --  210*  --   BUN 37*  --  33*  --  31*  --  30*  --   --  32*  --   CREATININE 1.69*  --  1.45*  --  1.43*  --  1.32*  --   --  1.35*  --   CALCIUM 6.9*  --  6.8*  --  7.2*  --  7.0*  --   --  7.2*  --   PHOS 4.5  --  3.6  --  3.4  --   --   --   --   --   --    < > = values in this interval not displayed.   Liver Function Tests: Recent Labs  Lab 05/31/19 0308 05/31/19 1618 06/01/19 1937  AST 22 32 40  ALT _0 ALKPHOS 38 38 49  BILITOT 1.7* 1.8* 2.3*  PROT 4.1* 4.5* 4.4*  ALBUMIN 2.3* 2.7* 2.4*   No results for input(s): LIPASE, AMYLASE in the last 168 hours. No results for input(s): AMMONIA in the last 168 hours. CBC: Recent Labs  Lab 05/28/2019 0211  06/10/2019 2308  05/30/19 0550  05/31/19 0308  05/31/19 1113  05/31/19 1618  05/31/19 2233  06/01/19 0128 06/01/19 0316 06/01/19 0610  WBC 30.1*   < > 19.3*   < > 15.6*   < > 16.0*  --  18.4*  --  18.9*  --  22.2*  --   --  20.6*  --   NEUTROABS 22.3*  --  15.6*  --  12.9*  --   --   --   --   --   --   --   --   --   --   --   --   HGB 5.9*   < > 11.0*   < > 9.0*   < > 7.0*   < > 9.8*   < > 8.9*   < > 8.3*   < > 7.5* 7.6* 7.8*  HCT 18.0*   < > 31.7*   < > 26.4*   < > 20.7*   < > 29.1*   < > 25.6*   < > 24.2*   < > 22.0* 22.3* 23.0*  MCV 91.4   < > 88.5   < > 90.1   < > 92.0  --  93.3  --  90.8  --  92.0  --   --  92.5  --   PLT 54*   < > 46*   < > 97*   < > 68*  --  45*  --  38*  --  30*  --   --  22*  --    < > = values in this interval not displayed.   Cardiac Enzymes: No results for input(s): CKTOTAL, CKMB, CKMBINDEX, TROPONINI in the last 168 hours. CBG: Recent Labs  Lab 05/31/19 1113 05/31/19 1618 05/31/19 2027 06/01/19 0029 06/01/19 0555  GLUCAP 159* 147* 160* 179* 184*    Iron Studies: No results for input(s): IRON, TIBC, TRANSFERRIN, FERRITIN in  the last 72 hours. Studies/Results: Dg Abd 1 View  Result Date: 05/30/2019 CLINICAL DATA:  Fluoroscopic guided feeding tube placement. EXAM: ABDOMEN - 1 VIEW COMPARISON:  Radiograph 05/26/2019 FINDINGS: Single fluoroscopic spot image demonstrates a feeding tube tip in the region of the fourth portion the duodenum. IMPRESSION: Feeding tube tip at the ligament of Treitz. Electronically Signed   By: Marijo Sanes M.D.   On: 05/30/2019 13:11   Dg Chest Port 1 View  Result Date: 05/31/2019 CLINICAL DATA:  Intubation. EXAM: PORTABLE CHEST 1 VIEW COMPARISON:  05/30/2019. FINDINGS: Endotracheal tube, Swan-Ganz catheter, left IJ line, right PICC line, mediastinal drainage catheter appear in stable position. NG tube and feeding tube tips below left hemidiaphragm. Impella device in stable position. Prior median sternotomy. Overlying surgical tubing appear to be in stable position. Heart size stable. Diffuse bilateral interstitial prominence noted, increased from prior exam. Interstitial edema and/or pneumonitis could present this fashion. Tiny right pleural effusion cannot be excluded. No pneumothorax. IMPRESSION: 1. NG tube and feeding tube tips noted below left hemidiaphragm. Endotracheal tube, Swan-Ganz catheter, left IJ line, right PICC line, mediastinal drainage catheter appear stable position. No pneumothorax. 2. Impella device in stable position.  Prior median sternotomy. Stable cardiomegaly. 3. Diffuse bilateral pulmonary interstitial prominence increased from prior exam. Interstitial edema and/or pneumonitis could present this fashion. Small right pleural effusion. Electronically Signed   By: Marcello Moores  Register   On: 05/31/2019 07:53    Medications: Infusions: .  prismasol BGK 4/2.5 400 mL/hr at 05/31/19 2200  .  prismasol BGK 4/2.5 300 mL/hr at 06/01/19 0653  . sodium chloride 10 mL/hr at 06/01/19 0702  . sodium chloride Stopped (05/28/19 0127)  . sodium chloride    . sodium chloride 1 mL/hr at  06/09/2019 1900  . albumin human    . cisatracurium (NIMBEX) infusion Stopped (05/30/19 0844)  . feeding supplement (VITAL 1.5 CAL) 40 mL/hr at 05/31/19 1700  . fentaNYL infusion INTRAVENOUS 250 mcg/hr (06/01/19 0702)  . impella catheter heparin 50 unit/mL in dextrose 5% 50,000 Units (05/23/19 0815)  . heparin 200 Units/hr (06/01/19 0702)  . meropenem (MERREM) IV Stopped (06/01/19 0142)  . midazolam 6 mg/hr (06/01/19 0702)  . norepinephrine (LEVOPHED) Adult infusion 5 mcg/min (06/01/19 0702)  . prismasol BGK 4/2.5 1,800 mL/hr at 06/01/19 0514  . vancomycin Stopped (05/31/19 1021)    Scheduled Medications: . acetaminophen  1,000 mg Oral Q6H   Or  . acetaminophen (TYLENOL) oral liquid 160 mg/5 mL  1,000 mg Per Tube Q6H  . acetaminophen (TYLENOL) oral liquid 160 mg/5 mL  650 mg Per Tube Once   Or  . acetaminophen  650 mg Rectal Once  . arformoterol  15 mcg Nebulization BID  . aspirin  81 mg Per Tube Daily  . atorvastatin  80 mg Per Tube q1800  . B-complex with vitamin C  1 tablet Per Tube Daily  . chlorhexidine gluconate (MEDLINE KIT)  15 mL Mouth Rinse BID  . Chlorhexidine Gluconate Cloth  6 each Topical Daily  . feeding supplement (PRO-STAT SUGAR FREE 64)  30 mL Per Tube QID  . guaiFENesin  30 mL Per Tube BID  . insulin aspart  1-3 Units Subcutaneous Q4H  . mouth rinse  15 mL Mouth Rinse 10 times per day  . methylPREDNISolone (SOLU-MEDROL) injection  60 mg Intravenous Q6H  . mupirocin ointment   Nasal BID  . pantoprazole  40 mg Intravenous Q12H  . sodium chloride flush  10-40 mL Intracatheter Q12H  . sodium chloride flush  3 mL Intravenous Q12H    have reviewed scheduled and prn medications.  Physical Exam: Unchanged physical exam General: Intubated and sedated, critically ill Heart:RRR, s1s2 nl Lungs: Bibasal coarse breath sound, no wheezing Abdomen:soft, Non-tender, non-distended Extremities: LE edema ++ Dialysis Access: Left IJ catheter site clean.  Multiple line for  ECMO.  Raymond Chavez 06/01/2019,8:17 AM  LOS: 16 days  Pager: [phone]

## 2019-06-01 NOTE — Progress Notes (Signed)
Upon assisting the bedside RN with turning, Venous pressure become more negative resulting in decreased flows and noticeable chugging on lines, decreased flow from 3.8 to 3.5 lpm, pt stable, discussed with bedside RN regarding the amount pulling with CRRT, will continue to monitor patient

## 2019-06-01 NOTE — Progress Notes (Signed)
Pharmacy Antibiotic Note  Raymond Chavez is a 68 y.o. male admitted on 05/20/2019 ACS with cardiogenic shock > Impella placed P8>P2 now on ECMO and CRT as well   Pharmacy has been consulted for meropenem and vancomycin dosing. WBC up to 30> 20, LA 1.2 afeb VT drawn today 14 at goal Continue ABX over the weekend reassess next week as preparing for the OR  Plan: Continue vancomycin 769m daily  Meropenem 1g IV every 8 hours  Monitor CRRT, cx results, clinical pic, and vanc levels as appropriate  Height: _0  (170.2 cm)(measured x3) Weight: 168 lb 3.4 oz (76.3 kg) IBW/kg (Calculated) : 66.1  Temp (24hrs), Avg:98.2 F (36.8 C), Min:96.9 F (36.1 C), Max:99.1 F (37.3 C)  Recent Labs  Lab 05/30/19 0425  05/30/19 1626 05/31/19 0308 05/31/19 1113 05/31/19 1618 05/31/19 2233 06/01/19 0316 06/01/19 1016  WBC  --    < > 16.0* 16.0* 18.4* 18.9* 22.2* 20.6*  --   CREATININE  --    < > 1.69* 1.45*  --  1.43* 1.32* 1.35*  --   LATICACIDVEN 1.0  --  1.0 1.1  --  1.2  --  1.0  --   VANCOTROUGH  --   --   --   --   --   --   --   --  14*   < > = values in this interval not displayed.    Estimated Creatinine Clearance: 48.3 mL/min (A) (by C-G formula based on SCr of 1.35 mg/dL (H)).    No Known Allergies  Antimicrobials this admission: Cefepime 10/22>>10/26, 10/29>>11/1 Vancomycin 10/22>>10/25, 10/28>>11/1, 11/4>> Ampicillin 11/1>>11/4 Meropenem 11/4>>  Dose adjustments this admission: N/A  Microbiology results: 10/29 BCx - ngtd 10/28 BAL - 40k e faecalis - S to vanc/amp 10/24 TA - normal flora 10/22BCx: ngF 10/22UCx:ng 10/22 Respiratory PCR: negative 10/22 COVID: negative  LBonnita NasutiPharm.D. CPP, BCPS Clinical Pharmacist [phone]/01/2019 1:16 PM

## 2019-06-01 NOTE — Progress Notes (Signed)
3 Days Post-Op Procedure(s) (LRB): CANNULATION FOR ECMO (EXTRACORPOREAL MEMBRANE OXYGENATION) (N/A) TRANSESOPHAGEAL ECHOCARDIOGRAM (TEE) (N/A) Subjective: Unable to assess  Objective: Vital signs in last 24 hours: Temp:  [97.2 F (36.2 C)-99.1 F (37.3 C)] 98.4 F (36.9 C) (11/07 0645) Pulse Rate:  [65-77] 65 (11/07 0343) Cardiac Rhythm: Normal sinus rhythm (11/07 0500) Resp:  [10-20] 14 (11/07 0700) BP: (94-103)/(66-73) 103/73 (11/07 0343) SpO2:  [98 %-100 %] 100 % (11/07 0700) Arterial Line BP: (61-107)/(54-76) 102/66 (11/07 0700) FiO2 (%):  [30 %] 30 % (11/07 0343) Weight:  [76.3 kg] 76.3 kg (11/07 0615)  Hemodynamic parameters for last 24 hours: PAP: (16-20)/(7-15) 16/7 CVP:  [4 mmHg-16 mmHg] 14 mmHg  Intake/Output from previous day: 11/06 0701 - 11/07 0700 In: 4118.1 [I.V.:1668.8; Blood:300; NG/GT:1100; IV Piggyback:700.3] Out: 5048 [Urine:5; Emesis/NG output:150; Stool:1100; Chest Tube:80] Intake/Output this shift: Total I/O In: 117.5 [I.V.:77.5; NG/GT:40] Out: 200 [Stool:200]  General appearance: pale and sedated Neurologic: unable to assess Heart: regular rate and rhythm, S1, S2 normal, no murmur, click, rub or gallop Lungs: rales bilaterally Abdomen: soft, non-tender; bowel sounds normal; no masses,  no organomegaly Extremities: warm and well-perfused Wound: dressed, dry  Lab Results: Recent Labs    05/31/19 2233  06/01/19 0316 06/01/19 0610  WBC 22.2*  --  20.6*  --   HGB 8.3*   < > 7.6* 7.8*  HCT 24.2*   < > 22.3* 23.0*  PLT 30*  --  22*  --    < > = values in this interval not displayed.   BMET:  Recent Labs    05/31/19 2233  06/01/19 0316 06/01/19 0610  NA 135   < > 137 138  K 4.5   < > 4.6 4.7  CL 104  --  105  --   CO2 24  --  24  --   GLUCOSE 181*  --  210*  --   BUN 30*  --  32*  --   CREATININE 1.32*  --  1.35*  --   CALCIUM 7.0*  --  7.2*  --    < > = values in this interval not displayed.    PT/INR:  Recent Labs     06/01/19 0316  LABPROT 21.1*  INR 1.9*   ABG    Component Value Date/Time   PHART 7.507 (H) 06/01/2019 0610   HCO3 27.6 06/01/2019 0610   TCO2 29 06/01/2019 0610   ACIDBASEDEF 1.0 06/16/2019 0255   O2SAT 100.0 06/01/2019 0610   CBG (last 3)  Recent Labs    05/31/19 2027 06/01/19 0029 06/01/19 0555  GLUCAP 160* 179* 184*    Assessment/Plan: S/P Procedure(s) (LRB): CANNULATION FOR ECMO (EXTRACORPOREAL MEMBRANE OXYGENATION) (N/A) TRANSESOPHAGEAL ECHOCARDIOGRAM (TEE) (N/A) Continue supportive management  Goal of 100 cc negative/hour today via HD Continue tube feeds Consider bronchoscopy and/or broncholytics Would not prophylactically change the pump today--I would favor trying to get him diuresed and off the pump in next few days    LOS: 16 days    Wonda Olds 06/01/2019

## 2019-06-01 NOTE — Progress Notes (Signed)
NAME:  Raymond Chavez, MRN:  [id number], DOB:  05-Dec-1950, LOS: 42 ADMISSION DATE:  05/12/2019, CONSULTATION DATE:  04/29/2019 REFERRING MD:  Billy Fischer  CHIEF COMPLAINT:  SOB   Brief History   Raymond Chavez is a 68 y.o. male who was admitted with acute hypoxic respiratory failure due to acute pulmonary edema in setting of NSTEMI, cardiogenic shock, possible PNA, He required intubation in the ED after failing BiPAP. On impella for hemodynamic support, ECMO on 11/4 CVTS and cardiology are discussing about CABG, LVAD  Past Medical History  HTN, CAD.  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  10/27-heparin stopped due to low platelets.  HIT panel sent.  Starting bivalirudin.  Off Levophed.  On minimal vent support but failed weaning trials 10/28 Bronchoscopy  10/29 Started on CRRT 10/30 Bronchoscopy with mucous plug removal 11/1 Extubated 11/4 progressive shock, respiratory failure requiring reintubation.  Pulmonary hemorrhage, heparin held.  Antibiotics broadened empirically. 11/4 cannulated and VA ECMO initiated 3335 11/5 paralytics stopped   Consults:  Cardiology PCCM Nephrology   Procedures:  ETT 10/22 >11/1 Swan 10/22 > Impella 10/23 >> 10/24 Changed to 5.0 via right axillary >>  Vas cath 10/29  Aline 11/3 ECMO 11/4 >>   Significant Diagnostic Tests:  CTA chest 10/22 > no PE, b/l basilar consolidation, diffuse interlobular septal thickening with GGO's, b/l hilar adenopathy. Echo 11/2: LVEF 15%, impella in place 10/27 UE venous doppler: Right: No evidence of deep vein thrombosis in the upper extremity. However, unable to visualize the IJV, axillary. No evidence of superficial vein thrombosis in the upper extremity. However, unable to visualize the IJV, axillary. No evidence of thrombosis in the  . However, unable to visualize the IJV, axillary.   Left: No evidence of thrombosis in the subclavian.     Micro Data:  Blood 10/22, 10/24, 10/28, 10/29 >>> NGTD Sputum 10/24 >>> Neg Urine 10/22 >>>Neg Urine Strep > Neg RVP 10/22 > Neg MRSA PCR > Neg SARS CoV2 10/22 > neg. BAL 10/28 > Enterococcus faecalis (40 K) Blood 10/29: negative  Antimicrobials:  Vanc 10/22 >10/25, 10/28>10/31 Cefepime 10/22 >10/26, 10/29>11/1 Ampicillin 11/1>11/4 vanc 11/4-> Merrem11/4->  Interim history/subjective:  CVVHD running, currently -90 cc/h Some dark clots have been suctioned overnight, no bright blood reported no purulent or mucoid secretions reported Heparin has been restarted, received PRBC and platelets this morning   Objective:  Blood pressure 103/73, pulse 65, temperature 98.4 F (36.9 C), resp. rate 14, height _0  (1.702 m), weight 76.3 kg, SpO2 100 %. PAP: (16-20)/(7-15) 16/7 CVP:  [4 mmHg-16 mmHg] 14 mmHg  Vent Mode: PRVC FiO2 (%):  [30 %] 30 % Set Rate:  [10 bmp] 10 bmp Vt Set:  [400 mL] 400 mL PEEP:  [5 cmH20] 5 cmH20 Plateau Pressure:  [19 cmH20-27 cmH20] 19 cmH20   Intake/Output Summary (Last 24 hours) at 06/01/2019 0754 Last data filed at 06/01/2019 4562 Gross per 24 hour  Intake 4235.61 ml  Output 5248 ml  Net -1012.39 ml   Filed Weights   05/28/19 0500 05/31/19 0500 06/01/19 0615  Weight: 72 kg 74.8 kg 76.3 kg   Physical Exam: General: Critically ill, sedated, intubated, ECMO circuit intact HENT: ET tube in place, no blood noted in the vent circuit.  He has had some dark plugs/clots suctioned over  the last 24 hours Eyes: No icterus, pupils equal Respiratory: Coarse bilateral inspiratory crackles, no wheezes Cardiovascular: Mechanical noise present GI: Nondistended, positive bowel sounds Extremities no significant lower extremity edema Neuro: Sedated, he does have a spontaneous respiratory effort, does not wake to voice  Assessment & Plan:   Acute  hypoxic respiratory failure requiring intubation -multifactorial  Pulmonary hemorrhage, appears to have stabilized 11/5 Aspiration event, with presumed pneumonia ARDS Extubated 11/2 and reintubated 11/4 am Chest x-ray 11/7 reviewed, now with diffuse bilateral interstitial infiltrates consistent with an ALI + pulmonary edema pattern, certainly increased inferiorly.  With prior films Plan  -PRVC 6 cc/kg, 0.30, PEEP 5, rate 10, P plat in the 20s -Continue ECMO with current sweep to maintain adequate pH.  May be able to assess ventilation in the next few days if pulmonary infiltrates improved -Heparin restarted -Appear to be suctioning clotted material adequately.  No clear indication for mucolytic's at this point.  Continue to follow chest x-ray, secretion burden.  Consider Mucomyst, guaifenesin, 3% saline going forward.  No clear indication for bronchoscopy at this time, would likely be traumatic -Agree with volume removal if at all possible (+12 L) -Discussed with Dr. [name] today  Cardiogenic Shock in setting of severe 3V Disease with depressed LV Dysfunction  H/O MI 1998, CAD  EF 15-20% with decreased RV systolic function  10/93 RHC/LHC which showed severe 3V CAD (LM 75%, LAD 100%, LCX 80%, RCA 80% prox) and severely depressed LV function Plan -Impella device location confirmed and in good position by echocardiogram 11/5 -Norepinephrine restarted overnight -Tentative plan CABG next week based on any improvement in his pulmonary status, chest x-ray -LDH appears to have plateaued, continuing to follow -Home antihypertensive regimen held (amlodipine, HCTZ, lisinopril)  Con-comitant Septic Shock secondary to Enterococcus Faecalis PNA, treated Aspiration event, probable recurrent pneumonia 11/4  Plan -Norepinephrine titrated for MAP 65 -Continue empiric meropenem, vancomycin (MRSA nasal swab positive), day 4 -Follow chest x-ray, WBC   Acute Kidney Injury  Plan -On CVVHD, agree with  pushing volume removal as much as he can tolerate  Acute on chronic normocytic anemia:  -Follow CBC, goal hemoglobin > 7.0 -Continue empiric PPI infusion for now.  Suspect that blood loss was pulmonary not GI -PRBC 11/7, 1 unit Thrombocytopenia HIT panel negative -Platelets given 11/7  ?ileus:  -Bowel regimen  Goals of care:  Goals of care discussions ongoing depending on clinical progress  Best Practice:  Diet: npo Pain/Anxiety/Delirium protocol: Fentanyl, Versed infusions VAP protocol: yes DVT prophylaxis: SCD's  GI prophylaxis: PPI. Glucose control: SSI. Mobility: bedrest Code Status: Full. Family Communication: Ongoing Disposition: ICU.  Labs   CBC: Recent Labs  Lab 05/27/19 1651 05/28/19 0329  05/28/2019 0211  05/31/2019 2308  05/30/19 0550  05/31/19 0308  05/31/19 1113  05/31/19 1618  05/31/19 2233 05/31/19 2235 06/01/19 0125 06/01/19 0128 06/01/19 0316 06/01/19 0610  WBC 26.8* 29.2*   < > 30.1*   < > 19.3*   < > 15.6*   < > 16.0*  --  18.4*  --  18.9*  --  22.2*  --   --   --  20.6*  --   NEUTROABS 20.5* 22.8*  --  22.3*  --  15.6*  --  12.9*  --   --   --   --   --   --   --   --   --   --   --   --   --   HGB 7.7*  8.7*   < > 5.9*   < > 11.0*   < > 9.0*   < > 7.0*   < > 9.8*   < > 8.9*   < > 8.3* 7.8* 7.1* 7.5* 7.6* 7.8*  HCT 22.8* 26.8*   < > 18.0*   < > 31.7*   < > 26.4*   < > 20.7*   < > 29.1*   < > 25.6*   < > 24.2* 23.0* 21.0* 22.0* 22.3* 23.0*  MCV 89.1 90.8   < > 91.4   < > 88.5   < > 90.1   < > 92.0  --  93.3  --  90.8  --  92.0  --   --   --  92.5  --   PLT 95* 93*   < > 54*   < > 46*   < > 97*   < > 68*  --  45*  --  38*  --  30*  --   --   --  22*  --    < > = values in this interval not displayed.   Basic Metabolic Panel: Recent Labs  Lab 05/30/19 0550  05/30/19 0903  05/30/19 1626  05/31/19 0308  05/31/19 1618  05/31/19 2233 05/31/19 2235 06/01/19 0125 06/01/19 0128 06/01/19 0316 06/01/19 0610  NA 139   < >  --    < > 138   < > 137    < > 136   < > 135 137 137 137 137 138  K 5.2*   < >  --    < > 4.7   < > 4.7   < > 4.6   < > 4.5 4.5 4.6 4.5 4.6 4.7  CL 106  --   --    < > 106  --  107  --  103  --  104  --   --   --  105  --   CO2 25  --   --    < > 25  --  24  --  23  --  24  --   --   --  24  --   GLUCOSE 113*  --   --    < > 152*  --  184*  --  172*  --  181*  --   --   --  210*  --   BUN 42*  --   --    < > 37*  --  33*  --  31*  --  30*  --   --   --  32*  --   CREATININE 1.93*  --   --    < > 1.69*  --  1.45*  --  1.43*  --  1.32*  --   --   --  1.35*  --   CALCIUM 6.9*  --   --    < > 6.9*  --  6.8*  --  7.2*  --  7.0*  --   --   --  7.2*  --   MG  --   --  2.4  --  2.5*  --  2.3  --  2.4  --   --   --   --   --  2.6*  --   PHOS 6.2*  --  5.1*  --  4.5  --  3.6  --  3.4  --   --   --   --   --   --   --    < > =  values in this interval not displayed.   GFR: Estimated Creatinine Clearance: 48.3 mL/min (A) (by C-G formula based on SCr of 1.35 mg/dL (H)). Recent Labs  Lab 05/28/19 0342  05/30/19 1626 05/31/19 0308 05/31/19 1113 05/31/19 1618 05/31/19 2233 06/01/19 0316  PROCALCITON 1.26  --   --   --   --   --   --   --   WBC  --    < > 16.0* 16.0* 18.4* 18.9* 22.2* 20.6*  LATICACIDVEN  --    < > 1.0 1.1  --  1.2  --  1.0   < > = values in this interval not displayed.   Liver Function Tests: Recent Labs  Lab 05/30/19 1112 05/30/19 1626 05/31/19 0308 05/31/19 1618 06/01/19 0316  AST _0 32 40  ALT _1 ALKPHOS 36* 37* 38 38 49  BILITOT 1.9* 1.3* 1.7* 1.8* 2.3*  PROT 4.1* 4.1* 4.1* 4.5* 4.4*  ALBUMIN 2.4* 2.2* 2.3* 2.7* 2.4*   No results for input(s): LIPASE, AMYLASE in the last 168 hours. No results for input(s): AMMONIA in the last 168 hours. ABG    Component Value Date/Time   PHART 7.507 (H) 06/01/2019 0610   PCO2ART 34.7 06/01/2019 0610   PO2ART 429.0 (H) 06/01/2019 0610   HCO3 27.6 06/01/2019 0610   TCO2 29 06/01/2019 0610   ACIDBASEDEF 1.0 06/12/2019 0255   O2SAT  100.0 06/01/2019 0610    Coagulation Profile: Recent Labs  Lab 05/30/19 0550 05/30/19 1626 05/31/19 0308 05/31/19 1618 06/01/19 0316  INR 1.4* 1.5* 1.6* 1.7* 1.9*   Cardiac Enzymes: No results for input(s): CKTOTAL, CKMB, CKMBINDEX, TROPONINI in the last 168 hours. HbA1C: Hgb A1c MFr Bld  Date/Time Value Ref Range Status  05/05/2019 11:25 PM 5.5 4.8 - 5.6 % Final    Comment:    (NOTE) Pre diabetes:          5.7%-6.4% Diabetes:              >6.4% Glycemic control for   <7.0% adults with diabetes    CBG: Recent Labs  Lab 05/31/19 1113 05/31/19 1618 05/31/19 2027 06/01/19 0029 06/01/19 0555  GLUCAP 159* 147* 160* 179* 184*    Independent critical care time 33 minutes  Baltazar Apo, MD, PhD 06/01/2019, 7:54 AM [facility] [phone] or if no answer [phone]

## 2019-06-01 NOTE — Progress Notes (Signed)
Pharmacy CONSULT NOTE  Pharmacy Consult for heparin Indication for heparin: Impella 5.0 & ECMO  No Known Allergies  Patient Measurements: Height: 5\' 7"  (170.2 cm)(measured x3) Weight: 168 lb 3.4 oz (76.3 kg) IBW/kg (Calculated) : 66.1  Vital Signs: Temp: 97.8 F (36.6 C) (11/07 1245) Temp Source: Axillary (11/07 1245) BP: 91/63 (11/07 1231) Pulse Rate: 65 (11/07 1231)  Labs: Recent Labs    05/30/19 0903  05/31/19 0308  05/31/19 1618  05/31/19 2025 05/31/19 2233  06/01/19 0316 06/01/19 0610 06/01/19 0842 06/01/19 1149  HGB  --    < > 7.0*   < > 8.9*   < >  --  8.3*   < > 7.6* 7.8* 7.8*  --   HCT  --    < > 20.7*   < > 25.6*   < >  --  24.2*   < > 22.3* 23.0* 23.0*  --   PLT  --    < > 68*   < > 38*  --   --  30*  --  22*  --   --   --   APTT >200*   < > 40*   < > 79*  --  56*  --   --  58*  --   --  68*  LABPROT  --    < > 18.4*  --  19.5*  --   --   --   --  21.1*  --   --   --   INR  --    < > 1.6*  --  1.7*  --   --   --   --  1.9*  --   --   --   HEPARINUNFRC 0.79*  --   --   --   --   --  <0.10*  --   --  <0.10*  --   --   --   CREATININE  --    < > 1.45*  --  1.43*  --   --  1.32*  --  1.35*  --   --   --    < > = values in this interval not displayed.    Estimated Creatinine Clearance: 48.3 mL/min (A) (by C-G formula based on SCr of 1.35 mg/dL (H)).  Assessment: 61 yoM admitted with shock and concern for ACS now s/p Impella CP placement in cath lab. Impella CP swapped out for 5.0 d/t hemolysis. Underwent [facility] with goal of CABG for revascularization.   ECMO initiate about 1800 Wed- originally started on heparin at 1500 units/hr systemically, which was held for bleeding.   Peripheral  Heparin was restarted 11/6 about noon 100 uts/hr now titrated up 200 uts/hr for thrombosis prevention.  Currently impella purge flow is  15.71mL/hr (765 units/hr)  Total heparin drip 965 uts/hr with aptt 68 sec at goal  - no changes for now - no changes in CT drainage or amount  being suctioned through ET tube ECMO circuit changed today for possible clot - no complications   Goal of Therapy:  APTT 50 - 70 sec Monitor platelets by anticoagulation protocol: Yes   Plan:  Continue heparin purge Continue heparin 200 units/hr - discussed with Dr [name] Repeat aPTT/Hep LVL at [place].D. CPP, BCPS Clinical Pharmacist [phone] 06/01/2019 12:57 PM     Please check AMION for all [place] numbers  06/01/2019 12:50 PM

## 2019-06-01 NOTE — Progress Notes (Signed)
Patient ID: Raymond Chavez, male   DOB: 1951/07/16, 68 y.o.   MRN: [id number]    Advanced Heart Failure Rounding Note   Subjective:    Events - Cath 10/23 with sever 3v CAD and cardiogenic shock. Impella CP placed - Underwent placement of Impella 5.0 on 10/24 with removal of R femoral Impella CP.  - Extubated 11/1, CVVH held on 11/3 given  - He had a possible aspiration event afternoon 11/3 then became progressively hypoxemic during the night.  He had to be re-intubated.  Bronchoscopy showed copious blood return suggesting hemoptysis. He developed PEA arrest during this time and underwent CPR  -11/4 initiate ECMO.  Patient went to OR, arterial cannula in ascending aorta and venous cannula in right femoral vein. Impella remains in place at P2.   Now on ECMO, CVVHD, and Impella 5.0  This morning had high deltaP felt due to pump clot and circuit changed. Remains on heparin. CT drainage down. Getting RBCs and PLTs now. Hgb 7.8 PLT 30-> 22K.   CVP 14. Mv sat 68%  Sedated on vent.   Impella at P-2. Motor current ok. Flow sensor non-functional  CVVHD pulling - 50  ECMO parameters Flow 3.7L 3300RPM. dP now 14 (was > 110)  Enterococcus faecalis on BAL culture.  Patient is now on vancomycin/meropenem     Objective:   Weight Range:  Vital Signs:   Temp:  [97.2 F (36.2 C)-99.1 F (37.3 C)] 97.7 F (36.5 C) (11/07 0847) Pulse Rate:  [65-77] 71 (11/07 0847) Resp:  [10-20] 10 (11/07 0847) BP: (94-103)/(66-73) 103/73 (11/07 0343) SpO2:  [84 %-100 %] 100 % (11/07 0847) Arterial Line BP: (54-114)/(50-78) 100/75 (11/07 0847) FiO2 (%):  [30 %] 30 % (11/07 0847) Weight:  [76.3 kg] 76.3 kg (11/07 0615) Last BM Date: 06/01/19  Weight change: Filed Weights   05/28/19 0500 05/31/19 0500 06/01/19 0615  Weight: 72 kg 74.8 kg 76.3 kg    Intake/Output:   Intake/Output Summary (Last 24 hours) at 06/01/2019 0904 Last data filed at 06/01/2019 0847 Gross per 24 hour  Intake 4453.03 ml   Output 5067 ml  Net -613.97 ml     Physical Exam: General: Intubated sedated HEENT: normal + ETT Neck: supple. JVP up Cor: Ecmo cannula and CT ok.  Lungs: coarse Abdomen: soft, nontender, nondistended. No hepatosplenomegaly. No bruits or masses. Good bowel sounds. Extremities: no cyanosis, clubbing, rash, 3+ edema RFV venous cannula sit oke Neuro: intubated sedated  Telemetry: NSR 60-70s no PVCs (personally reviewed)  Labs: Basic Metabolic Panel: Recent Labs  Lab 05/30/19 0550  05/30/19 0903  05/30/19 1626  05/31/19 0308  05/31/19 1618  05/31/19 2233  06/01/19 0125 06/01/19 0128 06/01/19 0316 06/01/19 0610 06/01/19 0842  NA 139   < >  --    < > 138   < > 137   < > 136   < > 135   < > 137 137 137 138 138  K 5.2*   < >  --    < > 4.7   < > 4.7   < > 4.6   < > 4.5   < > 4.6 4.5 4.6 4.7 4.5  CL 106  --   --    < > 106  --  107  --  103  --  104  --   --   --  105  --   --   CO2 25  --   --    < > 25  --  24  --  23  --  24  --   --   --  24  --   --   GLUCOSE 113*  --   --    < > 152*  --  184*  --  172*  --  181*  --   --   --  210*  --   --   BUN 42*  --   --    < > 37*  --  33*  --  31*  --  30*  --   --   --  32*  --   --   CREATININE 1.93*  --   --    < > 1.69*  --  1.45*  --  1.43*  --  1.32*  --   --   --  1.35*  --   --   CALCIUM 6.9*  --   --    < > 6.9*  --  6.8*  --  7.2*  --  7.0*  --   --   --  7.2*  --   --   MG  --   --  2.4  --  2.5*  --  2.3  --  2.4  --   --   --   --   --  2.6*  --   --   PHOS 6.2*  --  5.1*  --  4.5  --  3.6  --  3.4  --   --   --   --   --   --   --   --    < > = values in this interval not displayed.    Liver Function Tests: Recent Labs  Lab 05/30/19 1112 05/30/19 1626 05/31/19 0308 05/31/19 1618 06/01/19 0316  AST _0 32 40  ALT _1 ALKPHOS 36* 37* 38 38 49  BILITOT 1.9* 1.3* 1.7* 1.8* 2.3*  PROT 4.1* 4.1* 4.1* 4.5* 4.4*  ALBUMIN 2.4* 2.2* 2.3* 2.7* 2.4*   No results for input(s): LIPASE, AMYLASE in the  last 168 hours. No results for input(s): AMMONIA in the last 168 hours.  CBC: Recent Labs  Lab 05/27/19 1651 05/28/19 0329  05/30/2019 0211  05/31/2019 2308  05/30/19 0550  05/31/19 0308  05/31/19 1113  05/31/19 1618  05/31/19 2233  06/01/19 0125 06/01/19 0128 06/01/19 0316 06/01/19 0610 06/01/19 0842  WBC 26.8* 29.2*   < > 30.1*   < > 19.3*   < > 15.6*   < > 16.0*  --  18.4*  --  18.9*  --  22.2*  --   --   --  20.6*  --   --   NEUTROABS 20.5* 22.8*  --  22.3*  --  15.6*  --  12.9*  --   --   --   --   --   --   --   --   --   --   --   --   --   --   HGB 7.7* 8.7*   < > 5.9*   < > 11.0*   < > 9.0*   < > 7.0*   < > 9.8*   < > 8.9*   < > 8.3*   < > 7.1* 7.5* 7.6* 7.8* 7.8*  HCT 22.8* 26.8*   < > 18.0*   < > 31.7*   < > 26.4*   < > 20.7*   < >  29.1*   < > 25.6*   < > 24.2*   < > 21.0* 22.0* 22.3* 23.0* 23.0*  MCV 89.1 90.8   < > 91.4   < > 88.5   < > 90.1   < > 92.0  --  93.3  --  90.8  --  92.0  --   --   --  92.5  --   --   PLT 95* 93*   < > 54*   < > 46*   < > 97*   < > 68*  --  45*  --  38*  --  30*  --   --   --  22*  --   --    < > = values in this interval not displayed.    Cardiac Enzymes: No results for input(s): CKTOTAL, CKMB, CKMBINDEX, TROPONINI in the last 168 hours.  BNP: BNP (last 3 results) Recent Labs    05/02/2019 1739 04/27/2019 1234  BNP 2,725.3* 3,370.0*    ProBNP (last 3 results) No results for input(s): PROBNP in the last 8760 hours.    Other results:  Imaging: Dg Abd 1 View  Result Date: 05/30/2019 CLINICAL DATA:  Fluoroscopic guided feeding tube placement. EXAM: ABDOMEN - 1 VIEW COMPARISON:  Radiograph 06/20/2019 FINDINGS: Single fluoroscopic spot image demonstrates a feeding tube tip in the region of the fourth portion the duodenum. IMPRESSION: Feeding tube tip at the ligament of Treitz. Electronically Signed   By: Marijo Sanes M.D.   On: 05/30/2019 13:11   Dg Chest Port 1 View  Result Date: 05/31/2019 CLINICAL DATA:  Intubation. EXAM: PORTABLE  CHEST 1 VIEW COMPARISON:  05/30/2019. FINDINGS: Endotracheal tube, Swan-Ganz catheter, left IJ line, right PICC line, mediastinal drainage catheter appear in stable position. NG tube and feeding tube tips below left hemidiaphragm. Impella device in stable position. Prior median sternotomy. Overlying surgical tubing appear to be in stable position. Heart size stable. Diffuse bilateral interstitial prominence noted, increased from prior exam. Interstitial edema and/or pneumonitis could present this fashion. Tiny right pleural effusion cannot be excluded. No pneumothorax. IMPRESSION: 1. NG tube and feeding tube tips noted below left hemidiaphragm. Endotracheal tube, Swan-Ganz catheter, left IJ line, right PICC line, mediastinal drainage catheter appear stable position. No pneumothorax. 2. Impella device in stable position. Prior median sternotomy. Stable cardiomegaly. 3. Diffuse bilateral pulmonary interstitial prominence increased from prior exam. Interstitial edema and/or pneumonitis could present this fashion. Small right pleural effusion. Electronically Signed   By: Marcello Moores  Register   On: 05/31/2019 07:53     Medications:     Scheduled Medications: . sodium chloride   Intravenous Once  . acetaminophen  1,000 mg Oral Q6H   Or  . acetaminophen (TYLENOL) oral liquid 160 mg/5 mL  1,000 mg Per Tube Q6H  . acetaminophen (TYLENOL) oral liquid 160 mg/5 mL  650 mg Per Tube Once   Or  . acetaminophen  650 mg Rectal Once  . arformoterol  15 mcg Nebulization BID  . aspirin  81 mg Per Tube Daily  . atorvastatin  80 mg Per Tube q1800  . B-complex with vitamin C  1 tablet Per Tube Daily  . chlorhexidine gluconate (MEDLINE KIT)  15 mL Mouth Rinse BID  . Chlorhexidine Gluconate Cloth  6 each Topical Daily  . feeding supplement (PRO-STAT SUGAR FREE 64)  30 mL Per Tube QID  . guaiFENesin  30 mL Per Tube BID  . insulin aspart  1-3 Units Subcutaneous Q4H  .  mouth rinse  15 mL Mouth Rinse 10 times per day  .  methylPREDNISolone (SOLU-MEDROL) injection  60 mg Intravenous Q6H  . mupirocin ointment   Nasal BID  . pantoprazole  40 mg Intravenous Q12H  . sodium chloride flush  10-40 mL Intracatheter Q12H  . sodium chloride flush  3 mL Intravenous Q12H    Infusions: .  prismasol BGK 4/2.5 400 mL/hr at 05/31/19 2200  .  prismasol BGK 4/2.5 300 mL/hr at 06/01/19 0653  . sodium chloride 10 mL/hr at 06/01/19 0800  . sodium chloride Stopped (05/28/19 0127)  . sodium chloride    . sodium chloride 1 mL/hr at 06/18/2019 1900  . albumin human    . cisatracurium (NIMBEX) infusion Stopped (05/30/19 0844)  . feeding supplement (VITAL 1.5 CAL) 50 mL/hr at 06/01/19 0857  . fentaNYL infusion INTRAVENOUS 250 mcg/hr (06/01/19 0800)  . impella catheter heparin 50 unit/mL in dextrose 5% 50,000 Units (05/23/19 0815)  . heparin 200 Units/hr (06/01/19 0800)  . meropenem (MERREM) IV Stopped (06/01/19 0142)  . midazolam 5 mg/hr (06/01/19 0800)  . norepinephrine (LEVOPHED) Adult infusion 6 mcg/min (06/01/19 0800)  . prismasol BGK 4/2.5 1,800 mL/hr at 06/01/19 0514  . vancomycin Stopped (05/31/19 1021)    PRN Medications: sodium chloride, Place/Maintain arterial line **AND** sodium chloride, docusate, heparin, influenza vaccine adjuvanted, midazolam, morphine injection, ondansetron (ZOFRAN) IV, pneumococcal 23 valent vaccine, sodium chloride, sodium chloride flush, sodium chloride flush   Assessment/Plan:   1. Shock: Possible mixed cardiogenic/septic with fever and suspected PNA.  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  Echo with EF 20%, RV moderately HK.  Impella CP placed 10/23. Switched for Impella 5.0 on 10/24. No flow meter on Impella due to damage to sensor on insertion. Patient has LBBB.  PEA arrest 11/3, ECMO started 11/4. -   This morning, ECMO circuit changed due to high dP and rising LDH. Now at 3300 RPM with 3.7L flow. Impella is at P2.  MAP 70s on NE , co-ox 68%. CVVH net UF  50 cc/hr - Continue ECMO at current flow. Need to focus on volume removal to get him ready for OR if possible. Increase UF gradually to -200 as tolerated. Weight up about 13 pounds from baseline - Continue Impella at P2 for now, good position by CXR - Heparin resumed. No obvious bleeding - Replace blood products as needed - D/w ECMO coordinator, ECMO specialist and Dr. [name] personally 2. CAD:  NSTEMI, hs-TnI 16,000. 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  - continue ASA/statin. No b-blocker with shock - Goal is eventual CABG at some point next week, discussed with Dr. [name] this am  3. AKI: Baseline creatinine 1.3, now AKI likely due to ATN from shock and contrast (had CTA for PE at admission, only 25 cc contrast with cath).  CVVH ongoing.  - Continue CVVH. Increase UF as above 4. Acute Respiratory Failure: Has had PNA with Enterococcus faecalis in BAL cultures. Now re-intubated.  He had PEA arrest with possible aspiration 11/3 and also had pulmonary hemorrhage.  Hemoptysis appears to have stopped. CXR still wet and having mucous plugging - CVVH management as above - Continue vancomycin/meropenem.  - Bronch later today per Dr. [name] 5. ID: Suspected PNA, CXR with left base density. Enterococcus faecalis in BAL cultures.  Now afebrile and WBCs 16.  - As above, with possible aspiration/pulmonary hemorrhage/PEA arrest, started vancomycin/meropenem.  Continue 6. LBBB: Unsure chronicity.  7. Anemia: Hgb 7.8 today, replacing blood products 8. Thrombocytopenia: HIT negative  ?Low due to sepsis versus hemolysis. Continue to replace as needed 9. PVCs/NSVT: Quiescent, amiodarone was stopped.  10. FEN: Cortrak,getting post-pyloric TFs.  - now with diarrhea.   CRITICAL CARE Performed by: Glori Bickers  Total critical care time: 45 minutes  Critical care time was exclusive of separately billable procedures and treating other patients.  Critical care was  necessary to treat or prevent imminent or life-threatening deterioration.  Critical care was time spent personally by me on the following activities: development of treatment plan with patient and/or surrogate as well as nursing, discussions with consultants, evaluation of patient's response to treatment, examination of patient, obtaining history from patient or surrogate, ordering and performing treatments and interventions, ordering and review of laboratory studies, ordering and review of radiographic studies, pulse oximetry and re-evaluation of patient's condition.  Quillian Quince Kyani Simkin 06/01/2019 9:04 AM

## 2019-06-01 NOTE — Progress Notes (Signed)
Pharmacy CONSULT NOTE  Pharmacy Consult for heparin Indication for heparin: Impella 5.0 & ECMO  No Known Allergies  Patient Measurements: Height: 5\' 7"  (170.2 cm)(measured x3) Weight: 164 lb 14.5 oz (74.8 kg) IBW/kg (Calculated) : 66.1  Vital Signs: Temp: 98.4 F (36.9 C) (11/07 0115) Temp Source: Oral (11/07 0115) BP: 103/73 (11/07 0343) Pulse Rate: 65 (11/07 0343)  Labs: Recent Labs    05/30/19 0903  05/31/19 0308  05/31/19 1618  05/31/19 2025 05/31/19 2233  06/01/19 0125 06/01/19 0128 06/01/19 0316  HGB  --    < > 7.0*   < > 8.9*   < >  --  8.3*   < > 7.1* 7.5* 7.6*  HCT  --    < > 20.7*   < > 25.6*   < >  --  24.2*   < > 21.0* 22.0* 22.3*  PLT  --    < > 68*   < > 38*  --   --  30*  --   --   --  PENDING  APTT >200*   < > 40*   < > 79*  --  56*  --   --   --   --  58*  LABPROT  --    < > 18.4*  --  19.5*  --   --   --   --   --   --  21.1*  INR  --    < > 1.6*  --  1.7*  --   --   --   --   --   --  1.9*  HEPARINUNFRC 0.79*  --   --   --   --   --  <0.10*  --   --   --   --  <0.10*  CREATININE  --    < > 1.45*  --  1.43*  --   --  1.32*  --   --   --   --    < > = values in this interval not displayed.    Estimated Creatinine Clearance: 49.4 mL/min (A) (by C-G formula based on SCr of 1.32 mg/dL (H)).  Assessment: 68 yo male s/p Impella 5.0 placement, now on ECMO, for heparin   Systemic heparin infusing at 200 units/hr Currently impella purge flow is 15.4 mL/hr (770 units/hr)   Goal of Therapy:  APTT 50 - 70 sec Monitor platelets by anticoagulation protocol: Yes   Plan:  Continue Heparin at current rate   Phillis Knack, PharmD, BCPS  06/01/2019 4:18 AM

## 2019-06-01 NOTE — Progress Notes (Addendum)
Delta P began rising overnight, decrease in floew was observed. The decision was made to change the circuit. A bolus of 2000 units of Heparin was given. Sterile technique was used and a timeout was preformed. At [facility] came off ECMO, New circuit was connected vis an air free wet to wet connection by Dr. [name]. Flow was reinitiated on new circuit at 0814.    Cardiohelp 1 OXY: [id number] Tubing: [id number]

## 2019-06-01 NOTE — Progress Notes (Signed)
Pharmacy CONSULT NOTE  Pharmacy Consult for heparin Indication for heparin: Impella 5.0 & ECMO  No Known Allergies  Patient Measurements: Height: 5\' 7"  (170.2 cm)(measured x3) Weight: 168 lb 3.4 oz (76.3 kg) IBW/kg (Calculated) : 66.1  Vital Signs: Temp: 97.8 F (36.6 C) (11/07 1400) Temp Source: Axillary (11/07 1400) BP: 101/66 (11/07 1548) Pulse Rate: 67 (11/07 1548)  Labs: Recent Labs    05/30/19 0903  05/31/19 1618  05/31/19 2025 05/31/19 2233  06/01/19 0316 06/01/19 0610 06/01/19 0842 06/01/19 1149 06/01/19 1555  HGB  --    < > 8.9*   < >  --  8.3*   < > 7.6* 7.8* 7.8*  --  10.2*  HCT  --    < > 25.6*   < >  --  24.2*   < > 22.3* 23.0* 23.0*  --  29.4*  PLT  --    < > 38*  --   --  30*  --  22*  --   --   --  38*  APTT >200*   < > 79*  --  56*  --   --  58*  --   --  68* 50*  LABPROT  --    < > 19.5*  --   --   --   --  21.1*  --   --   --  18.9*  INR  --    < > 1.7*  --   --   --   --  1.9*  --   --   --  1.6*  HEPARINUNFRC 0.79*  --   --   --  <0.10*  --   --  <0.10*  --   --   --   --   CREATININE  --    < > 1.43*  --   --  1.32*  --  1.35*  --   --   --  1.26*   < > = values in this interval not displayed.    Estimated Creatinine Clearance: 51.7 mL/min (A) (by C-G formula based on SCr of 1.26 mg/dL (H)).  Assessment: Raymond Chavez admitted with shock and concern for ACS now s/p Impella CP placement in cath lab. Impella CP swapped out for 5.0 d/t hemolysis. Underwent [facility] ECM[facility] with goal of CABG for revascularization.   ECMO initiated about 1800 Wed- originally started on heparin at 1500 units/hr systemically, which was held for bleeding.   Peripheral Heparin was restarted 11/6 about noon.  APTT = 50 sec on 200 units/hr.  Noted Fibrinogen also increasing.  Dr. [name] ordered increase in heparin rate to 300 units/hr for thrombosis prevention.   Currently impella purge flow is  15.66mL/hr (765 units/hr)  Total heparin drip 1065 uts/hr.  Next aptt ~ 2300 with goal of  50-70 sec  ECMO circuit changed 11/7 for possible clot - no complications   Goal of Therapy:  APTT 50 - 70 sec Monitor platelets by anticoagulation protocol: Yes   Plan:  Continue heparin purge Continue heparin 300 units/hr - Dr [name] aware Repeat aPTT/Hep level at [employer], Pharm.D., BCPS Clinical Pharmacist  **Pharmacist phone directory can now be found on [email or web address] ([email or web address]).  Listed under [place].  06/01/2019 6:07 PM

## 2019-06-01 NOTE — Progress Notes (Signed)
Pharmacy Anti-Coagulation Consult Note:  Pharmacy Consult for Heparin Indication for heparin: Impella 5.0 & ECMO  No Known Allergies  Patient Measurements: Height: 5\' 7"  (170.2 cm)(measured x3) Weight: 168 lb 3.4 oz (76.3 kg) IBW/kg (Calculated) : 66.1  Vital Signs: Temp: 98.1 F (36.7 C) (11/07 2315) Temp Source: Oral (11/07 2315) BP: 88/57 (11/07 1952) Pulse Rate: 99 (11/07 1952)  Labs: Recent Labs    05/30/19 0903  05/31/19 1618  05/31/19 2025 05/31/19 2233  06/01/19 0316  06/01/19 1149 06/01/19 1555 06/01/19 1601 06/01/19 1708 06/01/19 2203 06/01/19 2345  HGB  --    < > 8.9*   < >  --  8.3*   < > 7.6*   < >  --  10.2* 9.9* 9.5*  --  8.2*  HCT  --    < > 25.6*   < >  --  24.2*   < > 22.3*   < >  --  29.4* 29.0* 28.0*  --  24.0*  PLT  --    < > 38*  --   --  30*  --  22*  --   --  38*  --   --   --   --   APTT >200*   < > 79*  --  56*  --   --  58*  --  68* 50*  --   --  57*  --   LABPROT  --    < > 19.5*  --   --   --   --  21.1*  --   --  18.9*  --   --   --   --   INR  --    < > 1.7*  --   --   --   --  1.9*  --   --  1.6*  --   --   --   --   HEPARINUNFRC 0.79*  --   --   --  <0.10*  --   --  <0.10*  --   --   --   --   --   --   --   CREATININE  --    < > 1.43*  --   --  1.32*  --  1.35*  --   --  1.26*  --   --   --   --    < > = values in this interval not displayed.    Estimated Creatinine Clearance: 51.7 mL/min (A) (by C-G formula based on SCr of 1.26 mg/dL (H)).  Assessment: 37 yoM admitted with shock and concern for ACS now s/p Impella CP placement in cath lab. Impella CP swapped out for 5.0 d/t hemolysis. Underwent [facility] ECMO with goal of CABG for revascularization.   ECMO initiate about 1800 Wed- originally started on heparin at 1500 units/hr systemically, which was held for bleeding.   Peripheral  Heparin was restarted 11/6 about noon 100 uts/hr now titrated up 200 uts/hr for thrombosis prevention.  Currently impella purge flow is  15.69mL/hr (765  units/hr)  Total heparin drip 965 uts/hr with aptt 68 sec at goal  - no changes for now - no changes in CT drainage or amount being suctioned through ET tube ECMO circuit changed today for possible clot - no complications   40/9 PM update:  APTT 57 after slight increase in peripheral heparin to 300 units/hr  Goal of Therapy:  APTT 50 - 70 sec Monitor platelets by anticoagulation protocol:  Yes   Plan:  Continue heparin purge Cont heparin at 300 units/hr AM aPTT  Abran Duke, PharmD, BCPS Clinical Pharmacist Phone: [phone]

## 2019-06-02 ENCOUNTER — Inpatient Hospital Stay (HOSPITAL_COMMUNITY): Payer: PPO

## 2019-06-02 LAB — CBC
HCT: 25.4 % — ABNORMAL LOW (ref 39.0–52.0)
HCT: 23.8 % — ABNORMAL LOW (ref 39.0–52.0)
HCT: 26.5 % — ABNORMAL LOW (ref 39.0–52.0)
Hemoglobin: 8.6 g/dL — ABNORMAL LOW (ref 13.0–17.0)
Hemoglobin: 7.9 g/dL — ABNORMAL LOW (ref 13.0–17.0)
Hemoglobin: 8.9 g/dL — ABNORMAL LOW (ref 13.0–17.0)
MCH: 31.5 pg (ref 26.0–34.0)
MCH: 31 pg (ref 26.0–34.0)
MCH: 30.9 pg (ref 26.0–34.0)
MCHC: 33.9 g/dL (ref 30.0–36.0)
MCHC: 33.2 g/dL (ref 30.0–36.0)
MCHC: 33.6 g/dL (ref 30.0–36.0)
MCV: 93 fL (ref 80.0–100.0)
MCV: 93.3 fL (ref 80.0–100.0)
MCV: 92 fL (ref 80.0–100.0)
Platelets: 36 10*3/uL — ABNORMAL LOW (ref 150–400)
Platelets: 35 10*3/uL — ABNORMAL LOW (ref 150–400)
Platelets: 34 10*3/uL — ABNORMAL LOW (ref 150–400)
RBC: 2.73 MIL/uL — ABNORMAL LOW (ref 4.22–5.81)
RBC: 2.55 MIL/uL — ABNORMAL LOW (ref 4.22–5.81)
RBC: 2.88 MIL/uL — ABNORMAL LOW (ref 4.22–5.81)
RDW: 15.5 % (ref 11.5–15.5)
RDW: 15.9 % — ABNORMAL HIGH (ref 11.5–15.5)
RDW: 15.7 % — ABNORMAL HIGH (ref 11.5–15.5)
WBC: 16 10*3/uL — ABNORMAL HIGH (ref 4.0–10.5)
WBC: 14.7 10*3/uL — ABNORMAL HIGH (ref 4.0–10.5)
WBC: 16.5 10*3/uL — ABNORMAL HIGH (ref 4.0–10.5)
nRBC: 0.1 % (ref 0.0–0.2)
nRBC: 0.1 % (ref 0.0–0.2)
nRBC: 0 % (ref 0.0–0.2)

## 2019-06-02 LAB — COMPREHENSIVE METABOLIC PANEL
ALT: 21 U/L (ref 0–44)
ALT: 20 U/L (ref 0–44)
ALT: 21 U/L (ref 0–44)
AST: 39 U/L (ref 15–41)
AST: 38 U/L (ref 15–41)
AST: 42 U/L — ABNORMAL HIGH (ref 15–41)
Albumin: 2.3 g/dL — ABNORMAL LOW (ref 3.5–5.0)
Albumin: 2.1 g/dL — ABNORMAL LOW (ref 3.5–5.0)
Albumin: 2.1 g/dL — ABNORMAL LOW (ref 3.5–5.0)
Alkaline Phosphatase: 67 U/L (ref 38–126)
Alkaline Phosphatase: 68 U/L (ref 38–126)
Alkaline Phosphatase: 71 U/L (ref 38–126)
Anion gap: 8 (ref 5–15)
Anion gap: 9 (ref 5–15)
Anion gap: 7 (ref 5–15)
BUN: 31 mg/dL — ABNORMAL HIGH (ref 8–23)
BUN: 34 mg/dL — ABNORMAL HIGH (ref 8–23)
BUN: 35 mg/dL — ABNORMAL HIGH (ref 8–23)
CO2: 25 mmol/L (ref 22–32)
CO2: 24 mmol/L (ref 22–32)
CO2: 25 mmol/L (ref 22–32)
Calcium: 7.3 mg/dL — ABNORMAL LOW (ref 8.9–10.3)
Calcium: 8 mg/dL — ABNORMAL LOW (ref 8.9–10.3)
Calcium: 7.7 mg/dL — ABNORMAL LOW (ref 8.9–10.3)
Chloride: 105 mmol/L (ref 98–111)
Chloride: 104 mmol/L (ref 98–111)
Chloride: 104 mmol/L (ref 98–111)
Creatinine, Ser: 1.29 mg/dL — ABNORMAL HIGH (ref 0.61–1.24)
Creatinine, Ser: 1.33 mg/dL — ABNORMAL HIGH (ref 0.61–1.24)
Creatinine, Ser: 1.32 mg/dL — ABNORMAL HIGH (ref 0.61–1.24)
GFR calc Af Amer: >60 mL/min (ref >60)
GFR calc Af Amer: >60 mL/min (ref >60)
GFR calc Af Amer: >60 mL/min (ref >60)
GFR calc non Af Amer: 56 mL/min — ABNORMAL LOW (ref >60)
GFR calc non Af Amer: 54 mL/min — ABNORMAL LOW (ref >60)
GFR calc non Af Amer: 55 mL/min — ABNORMAL LOW (ref >60)
Glucose, Bld: 208 mg/dL — ABNORMAL HIGH (ref 70–99)
Glucose, Bld: 199 mg/dL — ABNORMAL HIGH (ref 70–99)
Glucose, Bld: 164 mg/dL — ABNORMAL HIGH (ref 70–99)
Potassium: 4.7 mmol/L (ref 3.5–5.1)
Potassium: 4.6 mmol/L (ref 3.5–5.1)
Potassium: 4.7 mmol/L (ref 3.5–5.1)
Sodium: 138 mmol/L (ref 135–145)
Sodium: 137 mmol/L (ref 135–145)
Sodium: 136 mmol/L (ref 135–145)
Total Bilirubin: 2 mg/dL — ABNORMAL HIGH (ref 0.3–1.2)
Total Bilirubin: 2 mg/dL — ABNORMAL HIGH (ref 0.3–1.2)
Total Bilirubin: 1.8 mg/dL — ABNORMAL HIGH (ref 0.3–1.2)
Total Protein: 4.8 g/dL — ABNORMAL LOW (ref 6.5–8.1)
Total Protein: 4.7 g/dL — ABNORMAL LOW (ref 6.5–8.1)
Total Protein: 4.7 g/dL — ABNORMAL LOW (ref 6.5–8.1)

## 2019-06-02 LAB — POCT I-STAT 7, (LYTES, BLD GAS, ICA,H+H)
Acid-Base Excess: 6 mmol/L — ABNORMAL HIGH (ref 0.0–2.0)
Acid-Base Excess: 6 mmol/L — ABNORMAL HIGH (ref 0.0–2.0)
Acid-Base Excess: 4 mmol/L — ABNORMAL HIGH (ref 0.0–2.0)
Acid-Base Excess: 3 mmol/L — ABNORMAL HIGH (ref 0.0–2.0)
Acid-Base Excess: 3 mmol/L — ABNORMAL HIGH (ref 0.0–2.0)
Acid-Base Excess: 4 mmol/L — ABNORMAL HIGH (ref 0.0–2.0)
Bicarbonate: 30.1 mmol/L — ABNORMAL HIGH (ref 20.0–28.0)
Bicarbonate: 30.8 mmol/L — ABNORMAL HIGH (ref 20.0–28.0)
Bicarbonate: 28.6 mmol/L — ABNORMAL HIGH (ref 20.0–28.0)
Bicarbonate: 27.8 mmol/L (ref 20.0–28.0)
Bicarbonate: 28.8 mmol/L — ABNORMAL HIGH (ref 20.0–28.0)
Bicarbonate: 27.9 mmol/L (ref 20.0–28.0)
Calcium, Ion: 1 mmol/L — ABNORMAL LOW (ref 1.15–1.40)
Calcium, Ion: 1.06 mmol/L — ABNORMAL LOW (ref 1.15–1.40)
Calcium, Ion: 1.05 mmol/L — ABNORMAL LOW (ref 1.15–1.40)
Calcium, Ion: 1.09 mmol/L — ABNORMAL LOW (ref 1.15–1.40)
Calcium, Ion: 1.17 mmol/L (ref 1.15–1.40)
Calcium, Ion: 1.07 mmol/L — ABNORMAL LOW (ref 1.15–1.40)
HCT: 23 % — ABNORMAL LOW (ref 39.0–52.0)
HCT: 24 % — ABNORMAL LOW (ref 39.0–52.0)
HCT: 26 % — ABNORMAL LOW (ref 39.0–52.0)
HCT: 25 % — ABNORMAL LOW (ref 39.0–52.0)
HCT: 39 % (ref 39.0–52.0)
HCT: 26 % — ABNORMAL LOW (ref 39.0–52.0)
Hemoglobin: 7.8 g/dL — ABNORMAL LOW (ref 13.0–17.0)
Hemoglobin: 8.2 g/dL — ABNORMAL LOW (ref 13.0–17.0)
Hemoglobin: 8.8 g/dL — ABNORMAL LOW (ref 13.0–17.0)
Hemoglobin: 13.3 g/dL (ref 13.0–17.0)
Hemoglobin: 8.5 g/dL — ABNORMAL LOW (ref 13.0–17.0)
Hemoglobin: 8.8 g/dL — ABNORMAL LOW (ref 13.0–17.0)
O2 Saturation: 100 %
O2 Saturation: 100 %
O2 Saturation: 100 %
O2 Saturation: 100 %
O2 Saturation: 100 %
O2 Saturation: 100 %
Patient temperature: 98.8
Patient temperature: 37.2
Patient temperature: 98.4
Patient temperature: 98.2
Patient temperature: 98.4
Patient temperature: 98.6
Potassium: 4.4 mmol/L (ref 3.5–5.1)
Potassium: 4.6 mmol/L (ref 3.5–5.1)
Potassium: 4.6 mmol/L (ref 3.5–5.1)
Potassium: 4.5 mmol/L (ref 3.5–5.1)
Potassium: 4.6 mmol/L (ref 3.5–5.1)
Potassium: 4.8 mmol/L (ref 3.5–5.1)
Sodium: 138 mmol/L (ref 135–145)
Sodium: 138 mmol/L (ref 135–145)
Sodium: 138 mmol/L (ref 135–145)
Sodium: 136 mmol/L (ref 135–145)
Sodium: 138 mmol/L (ref 135–145)
Sodium: 138 mmol/L (ref 135–145)
TCO2: 31 mmol/L (ref 22–32)
TCO2: 32 mmol/L (ref 22–32)
TCO2: 30 mmol/L (ref 22–32)
TCO2: 29 mmol/L (ref 22–32)
TCO2: 30 mmol/L (ref 22–32)
TCO2: 29 mmol/L (ref 22–32)
pCO2 arterial: 38.6 mmHg (ref 32.0–48.0)
pCO2 arterial: 46.3 mmHg (ref 32.0–48.0)
pCO2 arterial: 43.9 mmHg (ref 32.0–48.0)
pCO2 arterial: 42.3 mmHg (ref 32.0–48.0)
pCO2 arterial: 45.9 mmHg (ref 32.0–48.0)
pCO2 arterial: 38.8 mmHg (ref 32.0–48.0)
pH, Arterial: 7.5 — ABNORMAL HIGH (ref 7.350–7.450)
pH, Arterial: 7.432 (ref 7.350–7.450)
pH, Arterial: 7.422 (ref 7.350–7.450)
pH, Arterial: 7.405 (ref 7.350–7.450)
pH, Arterial: 7.425 (ref 7.350–7.450)
pH, Arterial: 7.465 — ABNORMAL HIGH (ref 7.350–7.450)
pO2, Arterial: 476 mmHg — ABNORMAL HIGH (ref 83.0–108.0)
pO2, Arterial: 505 mmHg — ABNORMAL HIGH (ref 83.0–108.0)
pO2, Arterial: 401 mmHg — ABNORMAL HIGH (ref 83.0–108.0)
pO2, Arterial: 413 mmHg — ABNORMAL HIGH (ref 83.0–108.0)
pO2, Arterial: 433 mmHg — ABNORMAL HIGH (ref 83.0–108.0)
pO2, Arterial: 433 mmHg — ABNORMAL HIGH (ref 83.0–108.0)

## 2019-06-02 LAB — BPAM PLATELET PHERESIS
Blood Product Expiration Date: 202011082359
Blood Product Expiration Date: 202011082359
Blood Product Expiration Date: 202011092359
ISSUE DATE / TIME: 202011070614
ISSUE DATE / TIME: 202011071227
ISSUE DATE / TIME: 202011072325
Unit Type and Rh: 7300
Unit Type and Rh: 7300
Unit Type and Rh: 6200

## 2019-06-02 LAB — PREPARE PLATELET PHERESIS
Unit division: 0
Unit division: 0
Unit division: 0

## 2019-06-02 LAB — CBC WITH DIFFERENTIAL/PLATELET
Abs Immature Granulocytes: 0.5 10*3/uL — ABNORMAL HIGH (ref 0.00–0.07)
Basophils Absolute: 0 10*3/uL (ref 0.0–0.1)
Basophils Relative: 0 %
Eosinophils Absolute: 0 10*3/uL (ref 0.0–0.5)
Eosinophils Relative: 0 %
HCT: 24.1 % — ABNORMAL LOW (ref 39.0–52.0)
Hemoglobin: 8.1 g/dL — ABNORMAL LOW (ref 13.0–17.0)
Immature Granulocytes: 3 %
Lymphocytes Relative: 3 %
Lymphs Abs: 0.4 10*3/uL — ABNORMAL LOW (ref 0.7–4.0)
MCH: 31.3 pg (ref 26.0–34.0)
MCHC: 33.6 g/dL (ref 30.0–36.0)
MCV: 93.1 fL (ref 80.0–100.0)
Monocytes Absolute: 0.4 10*3/uL (ref 0.1–1.0)
Monocytes Relative: 3 %
Neutro Abs: 13.6 10*3/uL — ABNORMAL HIGH (ref 1.7–7.7)
Neutrophils Relative %: 91 %
Platelets: 32 10*3/uL — ABNORMAL LOW (ref 150–400)
RBC: 2.59 MIL/uL — ABNORMAL LOW (ref 4.22–5.81)
RDW: 15.7 % — ABNORMAL HIGH (ref 11.5–15.5)
WBC: 14.9 10*3/uL — ABNORMAL HIGH (ref 4.0–10.5)
nRBC: 0.1 % (ref 0.0–0.2)

## 2019-06-02 LAB — GLUCOSE, CAPILLARY
Glucose-Capillary: 138 mg/dL — ABNORMAL HIGH (ref 70–99)
Glucose-Capillary: 146 mg/dL — ABNORMAL HIGH (ref 70–99)
Glucose-Capillary: 150 mg/dL — ABNORMAL HIGH (ref 70–99)
Glucose-Capillary: 178 mg/dL — ABNORMAL HIGH (ref 70–99)
Glucose-Capillary: 184 mg/dL — ABNORMAL HIGH (ref 70–99)
Glucose-Capillary: 195 mg/dL — ABNORMAL HIGH (ref 70–99)
Glucose-Capillary: 205 mg/dL — ABNORMAL HIGH (ref 70–99)

## 2019-06-02 LAB — PROTIME-INR
INR: 1.5 — ABNORMAL HIGH (ref 0.8–1.2)
INR: 1.5 — ABNORMAL HIGH (ref 0.8–1.2)
Prothrombin Time: 18.3 seconds — ABNORMAL HIGH (ref 11.4–15.2)
Prothrombin Time: 17.6 seconds — ABNORMAL HIGH (ref 11.4–15.2)

## 2019-06-02 LAB — BPAM FFP
Blood Product Expiration Date: 202011112359
Blood Product Expiration Date: 202011112359
ISSUE DATE / TIME: 202011072148
ISSUE DATE / TIME: 202011072148
Unit Type and Rh: 5100
Unit Type and Rh: 5100

## 2019-06-02 LAB — APTT
aPTT: 51 seconds — ABNORMAL HIGH (ref 24–36)
aPTT: 60 seconds — ABNORMAL HIGH (ref 24–36)
aPTT: 67 seconds — ABNORMAL HIGH (ref 24–36)
aPTT: 61 seconds — ABNORMAL HIGH (ref 24–36)

## 2019-06-02 LAB — PREPARE FRESH FROZEN PLASMA
Unit division: 0
Unit division: 0

## 2019-06-02 LAB — LACTATE DEHYDROGENASE: LDH: 946 U/L — ABNORMAL HIGH (ref 98–192)

## 2019-06-02 LAB — PHOSPHORUS: Phosphorus: 3.1 mg/dL (ref 2.5–4.6)

## 2019-06-02 LAB — PREPARE RBC (CROSSMATCH)

## 2019-06-02 LAB — LACTIC ACID, PLASMA
Lactic Acid, Venous: 0.9 mmol/L (ref 0.5–1.9)
Lactic Acid, Venous: 0.9 mmol/L (ref 0.5–1.9)

## 2019-06-02 LAB — FIBRINOGEN
Fibrinogen: 600 mg/dL — ABNORMAL HIGH (ref 210–475)
Fibrinogen: 652 mg/dL — ABNORMAL HIGH (ref 210–475)

## 2019-06-02 LAB — COOXEMETRY PANEL
Carboxyhemoglobin: 2.8 % — ABNORMAL HIGH (ref 0.5–1.5)
Methemoglobin: 1.3 % (ref 0.0–1.5)
O2 Saturation: 92 %
Total hemoglobin: 8.6 g/dL — ABNORMAL LOW (ref 12.0–16.0)

## 2019-06-02 LAB — HEPARIN LEVEL (UNFRACTIONATED): Heparin Unfractionated: 0.16 IU/mL — ABNORMAL LOW (ref 0.30–0.70)

## 2019-06-02 MED ORDER — AMIODARONE LOAD VIA INFUSION
150.0000 mg | Freq: Once | INTRAVENOUS | Status: AC
  Administered 2019-06-02: 150 mg via INTRAVENOUS
  Filled 2019-06-02: qty 83.34

## 2019-06-02 MED ORDER — SODIUM CHLORIDE 0.9% IV SOLUTION
Freq: Once | INTRAVENOUS | Status: AC
  Administered 2019-06-02: 18:00:00 via INTRAVENOUS

## 2019-06-02 MED ORDER — AMIODARONE HCL IN DEXTROSE 360-4.14 MG/200ML-% IV SOLN
30.0000 mg/h | INTRAVENOUS | Status: DC
  Administered 2019-06-02 – 2019-06-03 (×2): 30 mg/h via INTRAVENOUS
  Filled 2019-06-02: qty 200

## 2019-06-02 MED ORDER — CHLORHEXIDINE GLUCONATE CLOTH 2 % EX PADS
6.0000 | MEDICATED_PAD | Freq: Every day | CUTANEOUS | Status: DC
  Administered 2019-06-03 – 2019-06-07 (×4): 6 via TOPICAL

## 2019-06-02 MED ORDER — CHLORHEXIDINE GLUCONATE 0.12 % MT SOLN
OROMUCOSAL | Status: AC
  Administered 2019-06-02: 15 mL via OROMUCOSAL
  Filled 2019-06-02: qty 15

## 2019-06-02 MED ORDER — AMIODARONE HCL IN DEXTROSE 360-4.14 MG/200ML-% IV SOLN
30.0000 mg/h | INTRAVENOUS | Status: AC
  Administered 2019-06-02 (×2): 30 mg/h via INTRAVENOUS
  Filled 2019-06-02 (×2): qty 200

## 2019-06-02 MED ORDER — SODIUM CHLORIDE 0.9 % IV SOLN
1.0000 g | Freq: Once | INTRAVENOUS | Status: AC
  Administered 2019-06-02: 1 g via INTRAVENOUS
  Filled 2019-06-02: qty 10

## 2019-06-02 NOTE — Progress Notes (Addendum)
Patient ID: Raymond Chavez, male   DOB: [birth year], 68 y.o.   MRN: [id number]    Advanced Heart Failure Rounding Note   Subjective:    Events - Cath 10/23 with sever 3v CAD and cardiogenic shock. Impella CP placed - Underwent placement of Impella 5.0 on 10/24 with removal of R femoral Impella CP.  - Extubated 11/1, CVVH held on 11/3 given  - He had a possible aspiration event afternoon 11/3 then became progressively hypoxemic during the night.  He had to be re-intubated.  Bronchoscopy showed copious blood return suggesting hemoptysis. He developed PEA arrest during this time and underwent CPR  -11/4 initiate ECMO.  Patient went to OR, arterial cannula in ascending aorta and venous cannula in right femoral vein. Impella remains in place at P2.  - 11/7 ECMO circuit change for high dP  Remains intubated/sedated on ECMO, CVVHD, and Impella 5.0  Did well overnight. Had some chatter on circuit and received blood products. PLTs remain low at 36k. Hgb 8.8. CVP 8 this am. Pulling - 200 on CVVHD. Weight down 4 pounds. ABGs look good. CXR still with diffuse infiltrates. LDH (738 -> 946) and fibrinogen (540->600) trending up. MV sat 69%. Minimal bleeding from chest tube (< 200)  Developed AF. Now on IV amio. Having post-termination pauses  Impella at P-2. Motor current stable. Flow sensor non-functional  CVVHD pulling - 200  ECMO parameters Flow 3.72L 3400RPM. dP now 17   Enterococcus faecalis on BAL culture.  Patient is now on vancomycin/meropenem   Objective:   Weight Range:  Vital Signs:   Temp:  [96.9 F (36.1 C)-99.2 F (37.3 C)] 98.4 F (36.9 C) (11/08 0600) Pulse Rate:  [64-101] 101 (11/08 0414) Resp:  [10-19] 10 (11/08 0645) BP: (85-104)/(57-83) 104/83 (11/08 0414) SpO2:  [84 %-100 %] 99 % (11/08 0630) Arterial Line BP: (54-114)/(50-88) 92/77 (11/08 0645) FiO2 (%):  [30 %] 30 % (11/08 0414) Weight:  [74.5 kg] 74.5 kg (11/08 0414) Last BM Date: 06/01/19  Weight change: Filed  Weights   05/31/19 0500 06/01/19 0615 06/02/19 0414  Weight: 74.8 kg 76.3 kg 74.5 kg    Intake/Output:   Intake/Output Summary (Last 24 hours) at 06/02/2019 0739 Last data filed at 06/02/2019 0600 Gross per 24 hour  Intake 4555.29 ml  Output 7654 ml  Net -3098.71 ml     Physical Exam: General:  Intubated sedated HEENT: normal +ET Neck: supple. no JVD. Carotids 2+ bilat; no bruits. No lymphadenopathy or thryomegaly appreciated. Cor: Irregular. Ecmo arterial cannula and CT tube ok  Lungs: coarse Abdomen: soft, nontender, nondistended. No hepatosplenomegaly. No bruits or masses. Good bowel sounds. Extremities: no cyanosis, clubbing, rash, 2+ LE edema (UE edema resolved) ecchymosis on LUE Neuro: intubated sedated   Telemetry: AF 60-70s with occasional post-termination pauses (personally reviewed)  Labs: Basic Metabolic Panel: Recent Labs  Lab 05/30/19 0550  05/30/19 0903  05/30/19 1626  05/31/19 0308  05/31/19 1618  05/31/19 2233  06/01/19 0316  06/01/19 1555  06/01/19 2345 06/02/19 0052 06/02/19 0303 06/02/19 0314 06/02/19 0600  NA 139   < >  --    < > 138   < > 137   < > 136   < > 135   < > 137   < > 140   < > 138 138 138 138 138  K 5.2*   < >  --    < > 4.7   < > 4.7   < > 4.6   < >  4.5   < > 4.6   < > 4.8   < > 4.6 4.4 4.7 4.6 4.6  CL 106  --   --    < > 106  --  107  --  103  --  104  --  105  --  107  --   --   --  105  --   --   CO2 25  --   --    < > 25  --  24  --  23  --  24  --  24  --  24  --   --   --  25  --   --   GLUCOSE 113*  --   --    < > 152*  --  184*  --  172*  --  181*  --  210*  --  176*  --   --   --  208*  --   --   BUN 42*  --   --    < > 37*  --  33*  --  31*  --  30*  --  32*  --  30*  --   --   --  31*  --   --   CREATININE 1.93*  --   --    < > 1.69*  --  1.45*  --  1.43*  --  1.32*  --  1.35*  --  1.26*  --   --   --  1.29*  --   --   CALCIUM 6.9*  --   --    < > 6.9*  --  6.8*  --  7.2*  --  7.0*  --  7.2*  --  7.1*  --   --   --  7.3*   --   --   MG  --   --  2.4  --  2.5*  --  2.3  --  2.4  --   --   --  2.6*  --  2.4  --   --   --   --   --   --   PHOS 6.2*  --  5.1*  --  4.5  --  3.6  --  3.4  --   --   --   --   --   --   --   --   --   --   --   --    < > = values in this interval not displayed.    Liver Function Tests: Recent Labs  Lab 05/31/19 0308 05/31/19 1618 06/01/19 0316 06/01/19 1555 06/02/19 0303  AST 22 32 40 43* 39  ALT _0 ALKPHOS 38 38 49 62 67  BILITOT 1.7* 1.8* 2.3* 2.1* 2.0*  PROT 4.1* 4.5* 4.4* 4.6* 4.8*  ALBUMIN 2.3* 2.7* 2.4* 2.3* 2.3*   No results for input(s): LIPASE, AMYLASE in the last 168 hours. No results for input(s): AMMONIA in the last 168 hours.  CBC: Recent Labs  Lab 05/27/19 1651 05/28/19 0329  06/20/2019 0211  06/16/2019 2308  05/30/19 0550  05/31/19 1618  05/31/19 2233  06/01/19 0316  06/01/19 1555  06/01/19 2345 06/02/19 0052 06/02/19 0303 06/02/19 0314 06/02/19 0600  WBC 26.8* 29.2*   < > 30.1*   < > 19.3*   < > 15.6*   < > 18.9*  --  22.2*  --  20.6*  --  19.3*  --   --   --  16.0*  --   --   NEUTROABS 20.5* 22.8*  --  22.3*  --  15.6*  --  12.9*  --   --   --   --   --   --   --   --   --   --   --   --   --   --   HGB 7.7* 8.7*   < > 5.9*   < > 11.0*   < > 9.0*   < > 8.9*   < > 8.3*   < > 7.6*   < > 10.2*   < > 8.2* 7.8* 8.6* 8.2* 8.8*  HCT 22.8* 26.8*   < > 18.0*   < > 31.7*   < > 26.4*   < > 25.6*   < > 24.2*   < > 22.3*   < > 29.4*   < > 24.0* 23.0* 25.4* 24.0* 26.0*  MCV 89.1 90.8   < > 91.4   < > 88.5   < > 90.1   < > 90.8  --  92.0  --  92.5  --  90.7  --   --   --  93.0  --   --   PLT 95* 93*   < > 54*   < > 46*   < > 97*   < > 38*  --  30*  --  22*  --  38*  --   --   --  36*  --   --    < > = values in this interval not displayed.    Cardiac Enzymes: No results for input(s): CKTOTAL, CKMB, CKMBINDEX, TROPONINI in the last 168 hours.  BNP: BNP (last 3 results) Recent Labs    04/28/2019 1739 05/05/2019 1234  BNP 2,725.3* 3,370.0*     ProBNP (last 3 results) No results for input(s): PROBNP in the last 8760 hours.    Other results:  Imaging: Dg Chest Port 1 View  Result Date: 06/01/2019 CLINICAL DATA:  68 year old male with ischemic heart disease, status post initiation of veno-arterial ECMO, Impella left ventricular assist EXAM: PORTABLE CHEST 1 VIEW COMPARISON:  Multiple prior, most recent 11 6 2020 FINDINGS: Cardiomediastinal silhouette unchanged in size and contour with surgical changes of median sternotomy. Mediastinal drain projects over the cardiac silhouette. Venous and arterial ECMO cannula again project over the mediastinum. Right axillary approach Impella, unchanged. Unchanged right IJ sheath. Unchanged right upper extremity PICC. Unchanged left IJ central venous catheter. Endotracheal tube appears to terminate 3.1 cm above the carina. Gastric tube projects over the mediastinum terminating in the left upper quadrant. Enteric feeding tube projects over the mediastinum terminating out of the field of view. Diffuse reticulonodular opacities of the lungs, similar to the prior with low lung volumes. No large pleural effusion or pneumothorax. IMPRESSION: Similar appearance of the chest x-ray to the most recent prior, with unchanged V-a ECMO cannula, mediastinal drain, Impella left ventricular assist device. Similar appearance of reticulonodular opacities throughout the lungs with no pneumothorax or large pleural effusion. Unchanged endotracheal tube terminating 3.1 cm above the carina. Unchanged gastric tube and enteric feeding tube. Unchanged right IJ sheath, left IJ central venous catheter, right upper extremity PICC. Electronically Signed   By: Corrie Mckusick D.O.   On: 06/01/2019 10:39     Medications:     Scheduled Medications: . sodium chloride   Intravenous Once  . acetaminophen  1,000 mg Oral Q6H   Or  . acetaminophen (TYLENOL) oral liquid 160 mg/5 mL  1,000 mg Per Tube Q6H  . acetaminophen (TYLENOL) oral liquid  160 mg/5 mL  650 mg Per Tube Once   Or  . acetaminophen  650 mg Rectal Once  . amiodarone  150 mg Intravenous Once  . arformoterol  15 mcg Nebulization BID  . aspirin  81 mg Per Tube Daily  . atorvastatin  80 mg Per Tube q1800  . B-complex with vitamin C  1 tablet Per Tube Daily  . chlorhexidine gluconate (MEDLINE KIT)  15 mL Mouth Rinse BID  . Chlorhexidine Gluconate Cloth  6 each Topical Daily  . feeding supplement (PRO-STAT SUGAR FREE 64)  30 mL Per Tube QID  . guaiFENesin  30 mL Per Tube BID  . insulin aspart  1-3 Units Subcutaneous Q4H  . mouth rinse  15 mL Mouth Rinse 10 times per day  . mupirocin ointment   Nasal BID  . pantoprazole  40 mg Intravenous Q12H  . sodium chloride flush  10-40 mL Intracatheter Q12H  . sodium chloride flush  3 mL Intravenous Q12H    Infusions: .  prismasol BGK 4/2.5 400 mL/hr at 06/02/19 0017  .  prismasol BGK 4/2.5 300 mL/hr at 06/02/19 0018  . sodium chloride 10 mL/hr at 06/02/19 0600  . sodium chloride Stopped (05/28/19 0127)  . sodium chloride    . sodium chloride 1 mL/hr at 05/28/2019 1900  . amiodarone 30 mg/hr (06/02/19 0600)  . cisatracurium (NIMBEX) infusion Stopped (05/30/19 0844)  . feeding supplement (VITAL 1.5 CAL) 1,000 mL (06/01/19 1529)  . fentaNYL infusion INTRAVENOUS 250 mcg/hr (06/02/19 0600)  . impella catheter heparin 50 unit/mL in dextrose 5% 50,000 Units (05/23/19 0815)  . heparin 500 Units/hr (06/02/19 0600)  . meropenem (MERREM) IV 1 g (06/02/19 0000)  . midazolam 6 mg/hr (06/02/19 0600)  . norepinephrine (LEVOPHED) Adult infusion 4 mcg/min (06/02/19 0600)  . prismasol BGK 4/2.5 1,800 mL/hr at 06/02/19 0620  . vancomycin 750 mg (06/01/19 1022)    PRN Medications: sodium chloride, Place/Maintain arterial line **AND** sodium chloride, docusate, heparin, influenza vaccine adjuvanted, midazolam, morphine injection, ondansetron (ZOFRAN) IV, pneumococcal 23 valent vaccine, sodium chloride, sodium chloride flush, sodium  chloride flush   Assessment/Plan:   1. Shock: Possible mixed cardiogenic/septic with fever and suspected PNA.  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  Echo with EF 20%, RV moderately HK.  Impella CP placed 10/23. Switched for Impella 5.0 on 10/24. No flow meter on Impella due to damage to sensor on insertion. Patient has LBBB.  PEA arrest 11/3, ECMO started 11/4. ECMO circuit changed due to high dP and rising LDH on 11/7 - Now at 3400 RPM with 3.7L flow. Impella is at P2.  MAP 70s on NE 5 , co-ox 69%. CVVH net UF 200 cc/hr - Continue ECMO at current flow. Volume status improved. CVP down to 8. About 5 pounds from baseline weight. Still with LE edema and CXR still wet. Wil continue to pull as tolerated. Place TED hose to help with LE edema.   - Continue Impella at P2 for now, good position by CXR. Given rising LDH will confirm on echo - Heparin resumed. No obvious bleeding. Heparin level low at 0.3. Heparin increase to 500u/hr - Continue to replace blood products as needed - D/w ECMO coordinator and ECMO specialist  2. CAD:  NSTEMI, hs-TnI 16,000. Cath 10/23  with severe 3v CAD; LM 75%, LAD 100%, LCX 80%, RCA 80% prox.  - continue ASA/statin. No b-blocker with shock - Goal is eventual CABG at some point next week, discussed with Dr. [name] 3. AKI: Baseline creatinine 1.3, now AKI likely due to ATN from shock and contrast (had CTA for PE at admission, only 25 cc contrast with cath).  CVVH ongoing.  - Continue CVVH. Management as above 4. Acute Respiratory Failure: Has had PNA with Enterococcus faecalis in BAL cultures. Now re-intubated.  He had PEA arrest with possible aspiration 11/3 and also had pulmonary hemorrhage.  Hemoptysis appears to have stopped. CXR still with diffuse infiltrates. ? Fluid vs ARDS vs infection - CVVH management as above. Continue volume removal - Continue vancomycin/meropenem. 5. ID: Suspected PNA, CXR with diffuse infiltrates. Enterococcus  faecalis in BAL cultures.  Tm 99.2 and WBCs stable at 16.  - As above, with possible aspiration/pulmonary hemorrhage/PEA arrest, started vancomycin/meropenem.   Continue - Recheck sputum cx 6. LBBB: Unsure chronicity.  7. Anemia: Hgb 8.8 today, replacing blood products 8. Thrombocytopenia: HIT negative  ?Low due to sepsis versus hemolysis. Continue to replace as needed 9. PVCs/NSVT: Quiescent.  10. FEN: Cortrak,getting post-pyloric TFs.  - diarrhea slowing  11. PAF with post-termination pauses - Continue amio and coumadin  CRITICAL CARE Performed by: Glori Bickers  Total critical care time: 45 minutes  Critical care time was exclusive of separately billable procedures and treating other patients.  Critical care was necessary to treat or prevent imminent or life-threatening deterioration.  Critical care was time spent personally by me on the following activities: development of treatment plan with patient and/or surrogate as well as nursing, discussions with consultants, evaluation of patient's response to treatment, examination of patient, obtaining history from patient or surrogate, ordering and performing treatments and interventions, ordering and review of laboratory studies, ordering and review of radiographic studies, pulse oximetry and re-evaluation of patient's condition.  Glori Bickers 06/02/2019 7:39 AM

## 2019-06-02 NOTE — Progress Notes (Signed)
100 mL fentanyl wasted d/t lines expired with Halina Maidens RN as witness.

## 2019-06-02 NOTE — Progress Notes (Signed)
Pharmacy Anti-Coagulation Consult Note:  Pharmacy Consult for Heparin Indication for heparin: Impella 5.0 & ECMO  No Known Allergies  Patient Measurements: Height: 5\' 7"  (170.2 cm)(measured x3) Weight: 164 lb 3.9 oz (74.5 kg) IBW/kg (Calculated) : 66.1  Vital Signs: Temp: 98.2 F (36.8 C) (11/08 1200) Temp Source: Oral (11/08 1200) BP: 90/78 (11/08 0745) Pulse Rate: 93 (11/08 1200)  Labs: Recent Labs    05/31/19 2025  06/01/19 0316  06/01/19 1555  06/01/19 2203  06/02/19 0303 06/02/19 0314 06/02/19 0600 06/02/19 1052 06/02/19 1150  HGB  --    < > 7.6*   < > 10.2*   < >  --    < > 8.6* 8.2* 8.8*  --  8.1*  HCT  --    < > 22.3*   < > 29.4*   < >  --    < > 25.4* 24.0* 26.0*  --  24.1*  PLT  --    < > 22*  --  38*  --   --   --  36*  --   --   --  32*  APTT 56*  --  58*   < > 50*  --  57*  --  51*  --   --  60*  --   LABPROT  --   --  21.1*  --  18.9*  --   --   --  18.3*  --   --   --   --   INR  --   --  1.9*  --  1.6*  --   --   --  1.5*  --   --   --   --   HEPARINUNFRC <0.10*  --  <0.10*  --   --   --   --   --   --   --   --   --   --   CREATININE  --    < > 1.35*  --  1.26*  --   --   --  1.29*  --   --   --  1.33*   < > = values in this interval not displayed.    Estimated Creatinine Clearance: 49 mL/min (A) (by C-G formula based on SCr of 1.33 mg/dL (H)).  Assessment: 45 yoM admitted with shock and concern for ACS now s/p Impella CP placement in cath lab. Impella CP swapped out for 5.0 d/t hemolysis. Underwent [facility] ECMO with goal of CABG for revascularization.   ECMO initiate about 1800 Wed- originally started on heparin at 1500 units/hr systemically, which was held for bleeding.   Peripheral  Heparin was restarted 11/6 about noon 100 uts/hr now titrated up 500 uts/hr for thrombosis prevention.  Currently impella purge flow is  15.52mL/hr (755 units/hr)  Total heparin drip 1255 uts/hr with aptt 60 sec < goal  -  Fibrinogen up slightly 621m LDH up 900  - no  changes in CT drainage or amount being suctioned through ET tube ECMO circuit changed 11/7 for possible clot - no complications     Goal of Therapy:  APTT 60 - 70 sec Monitor platelets by anticoagulation protocol: Yes   Plan:  Continue heparin purge Inc heparin to 600 units/hr per Dr. [name] Re-check aPTT with other labs at [facility].D. CPP, BCPS Clinical Pharmacist [phone] 06/02/2019 3:15 PM

## 2019-06-02 NOTE — Progress Notes (Signed)
[facility] NEPHROLOGY PROGRESS NOTE  Assessment/ Plan: Pt is a 68 y.o. yo male with cardiogenic shock who underwent placement of Impella on 10/24, on milrinone and Levophed, cardiac cath with three-vessel disease and echocardiogram revealing EF of 20%, respiratory failure, consulted for AKI.  Baseline creatinine level around 1.3.  #Acute kidney injury on CKD: due to cardiorenal syndrome and contrast nephropathy. CRRT since 10/30.  Patient had cardiac arrest on 11/3.  -Continue CRRT.  CVP better today however still has fluid overload.  Net UF around 200 cc/hour.  Continue 4K bath.  On IV heparin.  Discussed with ICU team.    #Cardiac arrest/cardiogenic shock: EF 20%, has Impella and now on ECMO.  Had  cardiac arrest on 11/3.   Heart failure team is following.    #Acute respiratory failure with hypoxia: Due to CHF and pneumonia. On mechanical ventilation and ECMO.  #CAD: NSTEMI, CABG with severe three-vessel disease.  On aspirin statin.    #Hypophosphatemia, hypocalcemia: Replete calcium.  Check phosphorus level.    Discussed with ICU team.  Subjective: Seen and examined at bedside.  Patient has ECMO, Impella and on mechanical ventilation.  Sedated.  No new event.  CRRT running well.  No problem with filter. Objective Vital signs in last 24 hours: Vitals:   06/02/19 0730 06/02/19 0745 06/02/19 0747 06/02/19 0800  BP:  90/78    Pulse:  86  89  Resp: _0 Temp:    98.2 F (36.8 C)  TempSrc:    Oral  SpO2: 100% 100% 100% 100%  Weight:      Height:       Weight change: -1.8 kg  Intake/Output Summary (Last 24 hours) at 06/02/2019 0836 Last data filed at 06/02/2019 0800 Gross per 24 hour  Intake 6208.41 ml  Output 8036 ml  Net -1827.59 ml       Labs: Basic Metabolic Panel: Recent Labs  Lab 05/30/19 1626  05/31/19 0308  05/31/19 1618  06/01/19 0316  06/01/19 1555  06/02/19 0303 06/02/19 0314 06/02/19 0600  NA 138   < > 137   < > 136   < > 137   <  > 140   < > 138 138 138  K 4.7   < > 4.7   < > 4.6   < > 4.6   < > 4.8   < > 4.7 4.6 4.6  CL 106  --  107  --  103   < > 105  --  107  --  105  --   --   CO2 25  --  24  --  23   < > 24  --  24  --  25  --   --   GLUCOSE 152*  --  184*  --  172*   < > 210*  --  176*  --  208*  --   --   BUN 37*  --  33*  --  31*   < > 32*  --  30*  --  31*  --   --   CREATININE 1.69*  --  1.45*  --  1.43*   < > 1.35*  --  1.26*  --  1.29*  --   --   CALCIUM 6.9*  --  6.8*  --  7.2*   < > 7.2*  --  7.1*  --  7.3*  --   --   PHOS 4.5  --  3.6  --  3.4  --   --   --   --   --   --   --   --    < > = values in this interval not displayed.   Liver Function Tests: Recent Labs  Lab 06/01/19 0316 06/01/19 1555 06/02/19 0303  AST 40 43* 39  ALT _0 ALKPHOS 49 62 67  BILITOT 2.3* 2.1* 2.0*  PROT 4.4* 4.6* 4.8*  ALBUMIN 2.4* 2.3* 2.3*   No results for input(s): LIPASE, AMYLASE in the last 168 hours. No results for input(s): AMMONIA in the last 168 hours. CBC: Recent Labs  Lab 06/09/2019 0211  06/02/2019 2308  05/30/19 0550  05/31/19 1618  05/31/19 2233  06/01/19 0316  06/01/19 1555  06/02/19 0303 06/02/19 0314 06/02/19 0600  WBC 30.1*   < > 19.3*   < > 15.6*   < > 18.9*  --  22.2*  --  20.6*  --  19.3*  --  16.0*  --   --   NEUTROABS 22.3*  --  15.6*  --  12.9*  --   --   --   --   --   --   --   --   --   --   --   --   HGB 5.9*   < > 11.0*   < > 9.0*   < > 8.9*   < > 8.3*   < > 7.6*   < > 10.2*   < > 8.6* 8.2* 8.8*  HCT 18.0*   < > 31.7*   < > 26.4*   < > 25.6*   < > 24.2*   < > 22.3*   < > 29.4*   < > 25.4* 24.0* 26.0*  MCV 91.4   < > 88.5   < > 90.1   < > 90.8  --  92.0  --  92.5  --  90.7  --  93.0  --   --   PLT 54*   < > 46*   < > 97*   < > 38*  --  30*  --  22*  --  38*  --  36*  --   --    < > = values in this interval not displayed.   Cardiac Enzymes: No results for input(s): CKTOTAL, CKMB, CKMBINDEX, TROPONINI in the last 168 hours. CBG: Recent Labs  Lab 06/01/19 1602  06/01/19 2001 06/01/19 2342 06/02/19 0311 06/02/19 0741  GLUCAP 154* 88 178* 184* 205*    Iron Studies: No results for input(s): IRON, TIBC, TRANSFERRIN, FERRITIN in the last 72 hours. Studies/Results: Dg Chest Port 1 View  Result Date: 06/01/2019 CLINICAL DATA:  68 year old male with ischemic heart disease, status post initiation of veno-arterial ECMO, Impella left ventricular assist EXAM: PORTABLE CHEST 1 VIEW COMPARISON:  Multiple prior, most recent 11 6 2020 FINDINGS: Cardiomediastinal silhouette unchanged in size and contour with surgical changes of median sternotomy. Mediastinal drain projects over the cardiac silhouette. Venous and arterial ECMO cannula again project over the mediastinum. Right axillary approach Impella, unchanged. Unchanged right IJ sheath. Unchanged right upper extremity PICC. Unchanged left IJ central venous catheter. Endotracheal tube appears to terminate 3.1 cm above the carina. Gastric tube projects over the mediastinum terminating in the left upper quadrant. Enteric feeding tube projects over the mediastinum terminating out of the field of view. Diffuse reticulonodular opacities of the lungs, similar to the prior with low lung volumes.  No large pleural effusion or pneumothorax. IMPRESSION: Similar appearance of the chest x-ray to the most recent prior, with unchanged V-a ECMO cannula, mediastinal drain, Impella left ventricular assist device. Similar appearance of reticulonodular opacities throughout the lungs with no pneumothorax or large pleural effusion. Unchanged endotracheal tube terminating 3.1 cm above the carina. Unchanged gastric tube and enteric feeding tube. Unchanged right IJ sheath, left IJ central venous catheter, right upper extremity PICC. Electronically Signed   By: Corrie Mckusick D.O.   On: 06/01/2019 10:39    Medications: Infusions: .  prismasol BGK 4/2.5 400 mL/hr at 06/02/19 0017  .  prismasol BGK 4/2.5 300 mL/hr at 06/02/19 0018  . sodium  chloride Stopped (06/02/19 0750)  . sodium chloride Stopped (05/28/19 0127)  . sodium chloride    . sodium chloride 1 mL/hr at 06/16/2019 1900  . amiodarone 30 mg/hr (06/02/19 0800)  . cisatracurium (NIMBEX) infusion Stopped (05/30/19 0844)  . feeding supplement (VITAL 1.5 CAL) 1,000 mL (06/01/19 1529)  . fentaNYL infusion INTRAVENOUS 250 mcg/hr (06/02/19 0800)  . impella catheter heparin 50 unit/mL in dextrose 5% 50,000 Units (05/23/19 0815)  . heparin 500 Units/hr (06/02/19 0800)  . meropenem (MERREM) IV 1 g (06/02/19 0750)  . midazolam 6 mg/hr (06/02/19 0800)  . norepinephrine (LEVOPHED) Adult infusion 4 mcg/min (06/02/19 0800)  . prismasol BGK 4/2.5 1,800 mL/hr at 06/02/19 0620  . vancomycin 750 mg (06/01/19 1022)    Scheduled Medications: . sodium chloride   Intravenous Once  . acetaminophen  1,000 mg Oral Q6H   Or  . acetaminophen (TYLENOL) oral liquid 160 mg/5 mL  1,000 mg Per Tube Q6H  . acetaminophen (TYLENOL) oral liquid 160 mg/5 mL  650 mg Per Tube Once   Or  . acetaminophen  650 mg Rectal Once  . arformoterol  15 mcg Nebulization BID  . aspirin  81 mg Per Tube Daily  . atorvastatin  80 mg Per Tube q1800  . B-complex with vitamin C  1 tablet Per Tube Daily  . chlorhexidine gluconate (MEDLINE KIT)  15 mL Mouth Rinse BID  . Chlorhexidine Gluconate Cloth  6 each Topical Daily  . feeding supplement (PRO-STAT SUGAR FREE 64)  30 mL Per Tube QID  . guaiFENesin  30 mL Per Tube BID  . insulin aspart  1-3 Units Subcutaneous Q4H  . mouth rinse  15 mL Mouth Rinse 10 times per day  . mupirocin ointment   Nasal BID  . pantoprazole  40 mg Intravenous Q12H  . sodium chloride flush  10-40 mL Intracatheter Q12H  . sodium chloride flush  3 mL Intravenous Q12H    have reviewed scheduled and prn medications.  Physical Exam:  General: Intubated and sedated, critically ill Heart:RRR, s1s2 nl Lungs: Bibasal coarse breath sound, no wheezing Abdomen:soft, Non-tender,  non-distended Extremities: LE edema ++ Dialysis Access: Left IJ catheter site clean.  Multiple line for ECMO.  Marguerite Jarboe Prasad Mignon Bechler 06/02/2019,8:36 AM  LOS: 17 days  Pager: [phone]

## 2019-06-02 NOTE — Progress Notes (Signed)
Pharmacy Anti-Coagulation Consult Note:  Pharmacy Consult for Heparin Indication for heparin: Impella 5.0 & ECMO  No Known Allergies  Patient Measurements: Height: 5\' 7"  (170.2 cm)(measured x3) Weight: 164 lb 3.9 oz (74.5 kg) IBW/kg (Calculated) : 66.1  Vital Signs: Temp: 98.2 F (36.8 C) (11/08 1755) Temp Source: Oral (11/08 1755) BP: 91/68 (11/08 1506) Pulse Rate: 94 (11/08 1755)  Labs: Recent Labs    05/31/19 2025  06/01/19 0316  06/01/19 1555  06/02/19 0303  06/02/19 0600 06/02/19 1052 06/02/19 1150 06/02/19 1632  HGB  --    < > 7.6*   < > 10.2*   < > 8.6*   < > 8.8*  --  8.1* 7.9*  HCT  --    < > 22.3*   < > 29.4*   < > 25.4*   < > 26.0*  --  24.1* 23.8*  PLT  --    < > 22*  --  38*  --  36*  --   --   --  32* 35*  APTT 56*  --  58*   < > 50*   < > 51*  --   --  60*  --  67*  LABPROT  --   --  21.1*  --  18.9*  --  18.3*  --   --   --   --  17.6*  INR  --   --  1.9*  --  1.6*  --  1.5*  --   --   --   --  1.5*  HEPARINUNFRC <0.10*  --  <0.10*  --   --   --   --   --   --   --   --   --   CREATININE  --    < > 1.35*  --  1.26*  --  1.29*  --   --   --  1.33* 1.32*   < > = values in this interval not displayed.    Estimated Creatinine Clearance: 49.4 mL/min (A) (by C-G formula based on SCr of 1.32 mg/dL (H)).  Assessment: 68 yo M admitted with shock and concern for ACS now s/p Impella CP placement in cath lab. Impella CP swapped out for 5.0 d/t hemolysis. Underwent [facility] ECMO with goal of CABG for revascularization.   ECMO initiate about 1800 11/4 - originally started on heparin at 1500 units/hr systemically, which was held for bleeding.   Peripheral Heparin was restarted 11/6 about noon 100 units/hr now titrated up 600 units/hr for thrombosis prevention.   Currently impella purge flow is  15.2mL/hr (755 units/hr)  Total heparin drip 1355 uts/hr with aptt 67 sec - within goal.  Fibrinogen trending up 652, LDH up 900   No changes in CT drainage or amount being  suctioned through ET tube ECMO circuit changed 11/7 for possible clot - no complications   Hgb trending down to 7.9 (from 8.1).  Transfuse 1 PRBC ordered.  Goal of Therapy:  APTT 60 - 70 sec Monitor platelets by anticoagulation protocol: Yes   Plan:  Continue heparin purge Continue heparin peripherally at 600 units/hr per Dr. [name] Re-check aPTT at [place], Pharm.D., BCPS Clinical Pharmacist  **Pharmacist phone directory can now be found on [email or web address] ([email or web address]).  Listed under [place].  06/02/2019 7:03 PM

## 2019-06-02 NOTE — Progress Notes (Signed)
Per Carolin Sicks, okay to continue using CRRT filter though past 72 hours d/t inability to return blood because of ECMO. Will more than likely need to be changed 11/9 if no issues overnight.

## 2019-06-02 NOTE — Progress Notes (Signed)
NAME:  Raymond Chavez, MRN:  [id number], DOB:  09-29-1950, LOS: 66 ADMISSION DATE:  05/12/2019, CONSULTATION DATE:  05/22/2019 REFERRING MD:  Billy Fischer  CHIEF COMPLAINT:  SOB   Brief History   Raymond Chavez is a 68 y.o. male who was admitted with acute hypoxic respiratory failure due to acute pulmonary edema in setting of NSTEMI, cardiogenic shock, possible PNA, He required intubation in the ED after failing BiPAP. On impella for hemodynamic support, ECMO on 11/4 CVTS and cardiology are discussing about CABG, LVAD  Past Medical History  HTN, CAD.  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  10/27-heparin stopped due to low platelets.  HIT panel sent.  Starting bivalirudin.  Off Levophed.  On minimal vent support but failed weaning trials 10/28 Bronchoscopy  10/29 Started on CRRT 10/30 Bronchoscopy with mucous plug removal 11/1 Extubated 11/4 progressive shock, respiratory failure requiring reintubation.  Pulmonary hemorrhage, heparin held.  Antibiotics broadened empirically. 11/4 cannulated and VA ECMO initiated 6222 11/5 paralytics stopped   Consults:  Cardiology PCCM Nephrology   Procedures:  ETT 10/22 >11/1 Swan 10/22 > Impella 10/23 >> 10/24 Changed to 5.0 via right axillary >>  Vas cath 10/29  Aline 11/3 ECMO 11/4 >>   Significant Diagnostic Tests:  CTA chest 10/22 > no PE, b/l basilar consolidation, diffuse interlobular septal thickening with GGO's, b/l hilar adenopathy. Echo 11/2: LVEF 15%, impella in place 10/27 UE venous doppler: Right: No evidence of deep vein thrombosis in the upper extremity. However, unable to visualize the IJV, axillary. No evidence of superficial vein thrombosis in the upper extremity. However, unable to visualize the IJV, axillary. No evidence of thrombosis in the  . However, unable to visualize the IJV, axillary. Left: No evidence of thrombosis in the subclavian.     Micro Data:  Blood 10/22, 10/24, 10/28, 10/29 >>> NGTD Sputum 10/24 >>> Neg Urine 10/22 >>>Neg Urine Strep > Neg RVP 10/22 > Neg MRSA PCR > Neg SARS CoV2 10/22 > neg. BAL 10/28 > Enterococcus faecalis (40 K) Blood 10/29: negative  Antimicrobials:  Vanc 10/22 >10/25, 10/28>10/31 Cefepime 10/22 >10/26, 10/29>11/1 Ampicillin 11/1>11/4 vanc 11/4-> Merrem11/4->  Interim history/subjective:  Over ventilated last night, adjustments made to ECMO and PCO2 has been stable in the low to mid 40s most recent ABGs CVVH running Has continued to have some old blood suctioned from ET tube intermittently.  No bright red blood noted, remains on heparin Platelets being given this morning Atrial fibrillation >> received amio bolus   Objective:  Blood pressure 90/78, pulse 89, temperature 98.2 F (36.8 C), temperature source Oral, resp. rate 11, height _0  (1.702 m), weight 74.5 kg, SpO2 100 %. CVP:  [3 mmHg-18 mmHg] 6 mmHg  Vent Mode: PRVC FiO2 (%):  [30 %] 30 % Set Rate:  [10 bmp] 10 bmp Vt Set:  [400 mL] 400 mL PEEP:  [5 cmH20] 5 cmH20 Plateau Pressure:  [16 cmH20-22 cmH20] 20 cmH20   Intake/Output Summary (Last 24 hours) at 06/02/2019 0923 Last data filed at 06/02/2019 0900 Gross per 24 hour  Intake 4367.18 ml  Output 8435 ml  Net -4067.82 ml   Filed Weights   05/31/19 0500 06/01/19 0615 06/02/19 0414  Weight: 74.8 kg 76.3 kg 74.5 kg   Physical Exam: General: Critically ill, sedated, intubated and ventilated,  ECMO circuit intact HENT: ET tube in place, no blood in the vent circuit noted.  Old blood in suction canister Eyes: Pupils equal, no icterus Respiratory: Coarse bilateral breath sounds, inspiratory crackles, no wheeze.  Intermittent hiccups noted Cardiovascular: Mechanical noise GI: Nondistended, positive bowel sounds Extremities no lower extremity edema Neuro:  Sedated, some grimace with pain, has a spontaneous respiratory effort but otherwise unresponsive  Chest x-ray 11/8 reviewed, persistent bilateral interstitial infiltrates, little change  Assessment & Plan:   Acute hypoxic respiratory failure requiring intubation -multifactorial  Pulmonary hemorrhage, appears to have stabilized 11/5 Aspiration event, with presumed pneumonia ARDS Hiccups Extubated 11/2 and reintubated 11/4 am Chest x-ray 11/7 reviewed, now with diffuse bilateral interstitial infiltrates consistent with an ALI + pulmonary edema pattern, certainly increased inferiorly.  With prior films Plan  -Continue PRVC 6 cc/kg, 0.30, PEEP 5, rate 10, P plat in the 20s -ECMO, current sweep is 3, pH now normalized.  Was up as high as 5 in the last 24 hours, note alkalosis last night. -Tolerating heparin although he does have some blood coming from his chest tube -Continue broad-spectrum antibiotics, empiric for possible pneumonia, vancomycin and meropenem -Consider tracheal aspirate although unclear whether this will be revealing since he has been on antibiotics for 5 days -Guaifenesin was added on 11/7 to help clear secretions, old clot.  Not much mucus reported, no purulent material reported.  No clear role here for Mucomyst or 3% saline but consider if this changes -Very effective volume removal last 24 hours, would continue to push volume removal as able and follow chest x-ray for improvement in his interstitial infiltrates -Could consider Thorazine for hiccups if they persist although note that it has multiple drug reactions, other potential side effects that would complicate his care including hyponatremia, decreased seizure threshold, leukopenia, etc.  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 Aspiration event, probable recurrent pneumonia 11/4.  At risk acute lung injury/pneumonitis Plan -Norepinephrine titrated for MAP 65 -Continue empiric meropenem, vancomycin (MRSA nasal swab positive), day 5 -Follow chest x-ray, WBC   Acute Kidney Injury  Plan -CVVHD.  Agree with aggressive volume removal as he can tolerate.  Hopefully this will impact respiratory status, chest x-ray  Acute on chronic normocytic anemia:  -Follow CBC, goal hemoglobin > 7.0 -Continue empiric PPI infusion for now.  Suspect that blood loss was pulmonary not GI -PRBC 11/7, 1 unit Thrombocytopenia HIT panel negative -Platelets given 11/8  ?ileus:  -Bowel regimen  Goals of care:  Goals of care discussions ongoing depending on clinical progress  Best Practice:  Diet: npo Pain/Anxiety/Delirium protocol: Fentanyl, Versed infusions VAP protocol: yes DVT prophylaxis: SCD's  GI prophylaxis: PPI. Glucose control: SSI. Mobility: bedrest Code Status: Full. Family Communication: Ongoing Disposition: ICU.  Labs   CBC: Recent Labs  Lab 05/27/19 1651 05/28/19 0329  06/22/2019 0211  06/19/2019 2308  05/30/19 0550  05/31/19 1618  05/31/19 2233  06/01/19 0998  06/01/19 1555  06/01/19 2345 06/02/19 0052 06/02/19 0303 06/02/19 3382  06/02/19 0600  WBC 26.8* 29.2*   < > 30.1*   < > 19.3*   < > 15.6*   < > 18.9*  --  22.2*  --  20.6*  --  19.3*  --   --   --  16.0*  --   --   NEUTROABS 20.5* 22.8*  --  22.3*  --  15.6*  --  12.9*  --   --   --   --   --   --   --   --   --   --   --   --   --   --   HGB 7.7* 8.7*   < > 5.9*   < >  11.0*   < > 9.0*   < > 8.9*   < > 8.3*   < > 7.6*   < > 10.2*   < > 8.2* 7.8* 8.6* 8.2* 8.8*  HCT 22.8* 26.8*   < > 18.0*   < > 31.7*   < > 26.4*   < > 25.6*   < > 24.2*   < > 22.3*   < > 29.4*   < > 24.0* 23.0* 25.4* 24.0* 26.0*  MCV 89.1 90.8   < > 91.4   < > 88.5   < > 90.1   < > 90.8  --  92.0  --  92.5  --  90.7  --   --   --  93.0  --   --   PLT 95* 93*   < > 54*   < > 46*   < > 97*   < > 38*  --  30*  --  22*  --  38*  --   --   --  36*  --   --    < > = values in this interval not displayed.   Basic Metabolic Panel: Recent Labs  Lab 05/30/19 0550  05/30/19 0903  05/30/19 1626  05/31/19 0308  05/31/19 1618  05/31/19 2233  06/01/19 0316  06/01/19 1555  06/01/19 2345 06/02/19 0052 06/02/19 0303 06/02/19 0314 06/02/19 0600  NA 139   < >  --    < > 138   < > 137   < > 136   < > 135   < > 137   < > 140   < > 138 138 138 138 138  K 5.2*   < >  --    < > 4.7   < > 4.7   < > 4.6   < > 4.5   < > 4.6   < > 4.8   < > 4.6 4.4 4.7 4.6 4.6  CL 106  --   --    < > 106  --  107  --  103  --  104  --  105  --  107  --   --   --  105  --   --   CO2 25  --   --    < > 25  --  24  --  23  --  24  --  24  --  24  --   --   --  25  --   --   GLUCOSE 113*  --   --    < > 152*  --  184*  --  172*  --  181*  --  210*  --  176*  --   --   --  208*  --   --   BUN 42*  --   --    < > 37*  --  33*  --  31*  --  30*  --  32*  --  30*  --   --   --  31*  --   --   CREATININE 1.93*  --   --    < > 1.69*  --  1.45*  --  1.43*  --  1.32*  --  1.35*  --  1.26*  --   --   --  1.29*  --   --   CALCIUM 6.9*  --   --    < > 6.9*  --  6.8*  --  7.2*  --  7.0*  --  7.2*  --  7.1*  --   --   --  7.3*  --   --   MG  --   --  2.4  --  2.5*  --  2.3  --  2.4  --   --   --  2.6*  --  2.4  --   --   --   --   --   --   PHOS 6.2*  --  5.1*  --  4.5  --  3.6  --  3.4  --   --   --   --   --   --   --   --   --   --   --   --    < > = values in this interval not displayed.   GFR: Estimated Creatinine Clearance: 50.5 mL/min  (A) (by C-G formula based on SCr of 1.29 mg/dL (H)). Recent Labs  Lab 05/28/19 0342  05/31/19 1618 05/31/19 2233 06/01/19 0316 06/01/19 1555 06/02/19 0303  PROCALCITON 1.26  --   --   --   --   --   --   WBC  --    < > 18.9* 22.2* 20.6* 19.3* 16.0*  LATICACIDVEN  --    < > 1.2  --  1.0 0.9 0.9   < > = values in this interval not displayed.   Liver Function Tests: Recent Labs  Lab 05/31/19 0308 05/31/19 1618 06/01/19 0316 06/01/19 1555 06/02/19 0303  AST 22 32 40 43* 39  ALT _0 ALKPHOS 38 38 49 62 67  BILITOT 1.7* 1.8* 2.3* 2.1* 2.0*  PROT 4.1* 4.5* 4.4* 4.6* 4.8*  ALBUMIN 2.3* 2.7* 2.4* 2.3* 2.3*   No results for input(s): LIPASE, AMYLASE in the last 168 hours. No results for input(s): AMMONIA in the last 168 hours. ABG    Component Value Date/Time   PHART 7.422 06/02/2019 0600   PCO2ART 43.9 06/02/2019 0600   PO2ART 401.0 (H) 06/02/2019 0600   HCO3 28.6 (H) 06/02/2019 0600   TCO2 30 06/02/2019 0600   ACIDBASEDEF 1.0 05/27/2019 0255   O2SAT 100.0 06/02/2019 0600    Coagulation Profile: Recent Labs  Lab 05/31/19 0308 05/31/19 1618 06/01/19 0316 06/01/19 1555 06/02/19 0303  INR 1.6* 1.7* 1.9* 1.6* 1.5*   Cardiac Enzymes: No results for input(s): CKTOTAL, CKMB, CKMBINDEX, TROPONINI in the last 168 hours. HbA1C: Hgb A1c MFr Bld  Date/Time Value Ref Range Status  05/03/2019 11:25 PM 5.5 4.8 - 5.6 % Final    Comment:    (NOTE) Pre diabetes:          5.7%-6.4% Diabetes:              >  6.4% Glycemic control for   <7.0% adults with diabetes    CBG: Recent Labs  Lab 06/01/19 1602 06/01/19 2001 06/01/19 2342 06/02/19 0311 06/02/19 0741  GLUCAP 154* 88 178* 184* 205*    Independent critical care time 32 minutes  Baltazar Apo, MD, PhD 06/02/2019, 9:23 AM [facility] [phone] or if no answer [phone]

## 2019-06-02 NOTE — Progress Notes (Signed)
Pharmacy Anti-Coagulation Consult Note:  Pharmacy Consult for Heparin Indication for heparin: Impella 5.0 & ECMO  No Known Allergies  Patient Measurements: Height: 5\' 7"  (170.2 cm)(measured x3) Weight: 164 lb 3.9 oz (74.5 kg) IBW/kg (Calculated) : 66.1  Vital Signs: Temp: 99.1 F (37.3 C) (11/08 0414) Temp Source: Oral (11/08 0414) BP: 104/83 (11/08 0414) Pulse Rate: 101 (11/08 0414)  Labs: Recent Labs    05/30/19 0903  05/31/19 2025  06/01/19 0316  06/01/19 1555  06/01/19 2203  06/02/19 0052 06/02/19 0303 06/02/19 0314  HGB  --    < >  --    < > 7.6*   < > 10.2*   < >  --    < > 7.8* 8.6* 8.2*  HCT  --    < >  --    < > 22.3*   < > 29.4*   < >  --    < > 23.0* 25.4* 24.0*  PLT  --    < >  --    < > 22*  --  38*  --   --   --   --  36*  --   APTT >200*   < > 56*  --  58*   < > 50*  --  57*  --   --  51*  --   LABPROT  --    < >  --   --  21.1*  --  18.9*  --   --   --   --  18.3*  --   INR  --    < >  --   --  1.9*  --  1.6*  --   --   --   --  1.5*  --   HEPARINUNFRC 0.79*  --  <0.10*  --  <0.10*  --   --   --   --   --   --   --   --   CREATININE  --    < >  --    < > 1.35*  --  1.26*  --   --   --   --  1.29*  --    < > = values in this interval not displayed.    Estimated Creatinine Clearance: 50.5 mL/min (A) (by C-G formula based on SCr of 1.29 mg/dL (H)).  Assessment: 21 yoM admitted with shock and concern for ACS now s/p Impella CP placement in cath lab. Impella CP swapped out for 5.0 d/t hemolysis. Underwent VA ECMO with goal of CABG for revascularization.   ECMO initiate about 1800 Wed- originally started on heparin at 1500 units/hr systemically, which was held for bleeding.   Peripheral  Heparin was restarted 11/6 about noon 100 uts/hr now titrated up 200 uts/hr for thrombosis prevention.  Currently impella purge flow is  15.71mL/hr (765 units/hr)  Total heparin drip 965 uts/hr with aptt 68 sec at goal  - no changes for now - no changes in CT drainage or  amount being suctioned through ET tube ECMO circuit changed today for possible clot - no complications   93/8 AM update:  APTT 51 this AM, Dr. [name] increasing peripheral heparin to 500 units/hr  Goal of Therapy:  APTT 50 - 70 sec Monitor platelets by anticoagulation protocol: Yes   Plan:  Continue heparin purge Inc heparin to 500 units/hr per Dr. [name] Re-check aPTT at [place], PharmD, [place] Pharmacist Phone: [phone]

## 2019-06-03 ENCOUNTER — Inpatient Hospital Stay (HOSPITAL_COMMUNITY): Payer: PPO | Admitting: Anesthesiology

## 2019-06-03 ENCOUNTER — Encounter (HOSPITAL_COMMUNITY): Admission: EM | Disposition: E | Payer: Self-pay | Source: Home / Self Care | Attending: Cardiothoracic Surgery

## 2019-06-03 ENCOUNTER — Inpatient Hospital Stay (HOSPITAL_COMMUNITY): Payer: PPO

## 2019-06-03 ENCOUNTER — Encounter (HOSPITAL_COMMUNITY): Payer: Self-pay | Admitting: Certified Registered Nurse Anesthetist

## 2019-06-03 ENCOUNTER — Other Ambulatory Visit (HOSPITAL_COMMUNITY): Payer: PPO

## 2019-06-03 DIAGNOSIS — J9601 Acute respiratory failure with hypoxia: Secondary | ICD-10-CM

## 2019-06-03 DIAGNOSIS — R570 Cardiogenic shock: Secondary | ICD-10-CM

## 2019-06-03 DIAGNOSIS — Z95811 Presence of heart assist device: Secondary | ICD-10-CM

## 2019-06-03 HISTORY — PX: CANNULATION FOR ECMO (EXTRACORPOREAL MEMBRANE OXYGENATION): SHX6796

## 2019-06-03 HISTORY — PX: CHEST TUBE INSERTION: SHX231

## 2019-06-03 HISTORY — PX: REMOVAL OF IMPELLA LEFT VENTRICULAR ASSIST DEVICE: SHX6556

## 2019-06-03 HISTORY — PX: TEE WITHOUT CARDIOVERSION: SHX5443

## 2019-06-03 LAB — ECHOCARDIOGRAM LIMITED
Height: 67 in
Weight: 2543.23 oz

## 2019-06-03 LAB — POCT I-STAT 7, (LYTES, BLD GAS, ICA,H+H)
Acid-Base Excess: 3 mmol/L — ABNORMAL HIGH (ref 0.0–2.0)
Acid-Base Excess: 3 mmol/L — ABNORMAL HIGH (ref 0.0–2.0)
Acid-Base Excess: 4 mmol/L — ABNORMAL HIGH (ref 0.0–2.0)
Acid-Base Excess: 4 mmol/L — ABNORMAL HIGH (ref 0.0–2.0)
Acid-Base Excess: 4 mmol/L — ABNORMAL HIGH (ref 0.0–2.0)
Acid-Base Excess: 4 mmol/L — ABNORMAL HIGH (ref 0.0–2.0)
Acid-Base Excess: 5 mmol/L — ABNORMAL HIGH (ref 0.0–2.0)
Bicarbonate: 28.2 mmol/L — ABNORMAL HIGH (ref 20.0–28.0)
Bicarbonate: 28.2 mmol/L — ABNORMAL HIGH (ref 20.0–28.0)
Bicarbonate: 28.4 mmol/L — ABNORMAL HIGH (ref 20.0–28.0)
Bicarbonate: 28.6 mmol/L — ABNORMAL HIGH (ref 20.0–28.0)
Bicarbonate: 28.6 mmol/L — ABNORMAL HIGH (ref 20.0–28.0)
Bicarbonate: 29 mmol/L — ABNORMAL HIGH (ref 20.0–28.0)
Bicarbonate: 29.3 mmol/L — ABNORMAL HIGH (ref 20.0–28.0)
Calcium, Ion: 0.99 mmol/L — ABNORMAL LOW (ref 1.15–1.40)
Calcium, Ion: 1 mmol/L — ABNORMAL LOW (ref 1.15–1.40)
Calcium, Ion: 1.05 mmol/L — ABNORMAL LOW (ref 1.15–1.40)
Calcium, Ion: 1.06 mmol/L — ABNORMAL LOW (ref 1.15–1.40)
Calcium, Ion: 1.08 mmol/L — ABNORMAL LOW (ref 1.15–1.40)
Calcium, Ion: 1.1 mmol/L — ABNORMAL LOW (ref 1.15–1.40)
Calcium, Ion: 1.11 mmol/L — ABNORMAL LOW (ref 1.15–1.40)
HCT: 20 % — ABNORMAL LOW (ref 39.0–52.0)
HCT: 21 % — ABNORMAL LOW (ref 39.0–52.0)
HCT: 22 % — ABNORMAL LOW (ref 39.0–52.0)
HCT: 23 % — ABNORMAL LOW (ref 39.0–52.0)
HCT: 23 % — ABNORMAL LOW (ref 39.0–52.0)
HCT: 24 % — ABNORMAL LOW (ref 39.0–52.0)
HCT: 26 % — ABNORMAL LOW (ref 39.0–52.0)
Hemoglobin: 6.8 g/dL — CL (ref 13.0–17.0)
Hemoglobin: 7.1 g/dL — ABNORMAL LOW (ref 13.0–17.0)
Hemoglobin: 7.5 g/dL — ABNORMAL LOW (ref 13.0–17.0)
Hemoglobin: 7.8 g/dL — ABNORMAL LOW (ref 13.0–17.0)
Hemoglobin: 7.8 g/dL — ABNORMAL LOW (ref 13.0–17.0)
Hemoglobin: 8.2 g/dL — ABNORMAL LOW (ref 13.0–17.0)
Hemoglobin: 8.8 g/dL — ABNORMAL LOW (ref 13.0–17.0)
O2 Saturation: 100 %
O2 Saturation: 100 %
O2 Saturation: 100 %
O2 Saturation: 100 %
O2 Saturation: 100 %
O2 Saturation: 100 %
O2 Saturation: 100 %
Patient temperature: 37.1
Patient temperature: 37.2
Patient temperature: 37.2
Patient temperature: 37.2
Patient temperature: 37.2
Patient temperature: 37.2
Patient temperature: 37.3
Potassium: 4.6 mmol/L (ref 3.5–5.1)
Potassium: 4.7 mmol/L (ref 3.5–5.1)
Potassium: 4.7 mmol/L (ref 3.5–5.1)
Potassium: 4.8 mmol/L (ref 3.5–5.1)
Potassium: 5 mmol/L (ref 3.5–5.1)
Potassium: 5.1 mmol/L (ref 3.5–5.1)
Potassium: 5.2 mmol/L — ABNORMAL HIGH (ref 3.5–5.1)
Sodium: 136 mmol/L (ref 135–145)
Sodium: 137 mmol/L (ref 135–145)
Sodium: 137 mmol/L (ref 135–145)
Sodium: 137 mmol/L (ref 135–145)
Sodium: 137 mmol/L (ref 135–145)
Sodium: 138 mmol/L (ref 135–145)
Sodium: 138 mmol/L (ref 135–145)
TCO2: 30 mmol/L (ref 22–32)
TCO2: 30 mmol/L (ref 22–32)
TCO2: 30 mmol/L (ref 22–32)
TCO2: 30 mmol/L (ref 22–32)
TCO2: 30 mmol/L (ref 22–32)
TCO2: 30 mmol/L (ref 22–32)
TCO2: 31 mmol/L (ref 22–32)
pCO2 arterial: 38.3 mmHg (ref 32.0–48.0)
pCO2 arterial: 40 mmHg (ref 32.0–48.0)
pCO2 arterial: 42.7 mmHg (ref 32.0–48.0)
pCO2 arterial: 45.3 mmHg (ref 32.0–48.0)
pCO2 arterial: 45.4 mmHg (ref 32.0–48.0)
pCO2 arterial: 46.9 mmHg (ref 32.0–48.0)
pCO2 arterial: 48.9 mmHg — ABNORMAL HIGH (ref 32.0–48.0)
pH, Arterial: 7.382 (ref 7.350–7.450)
pH, Arterial: 7.403 (ref 7.350–7.450)
pH, Arterial: 7.405 (ref 7.350–7.450)
pH, Arterial: 7.409 (ref 7.350–7.450)
pH, Arterial: 7.43 (ref 7.350–7.450)
pH, Arterial: 7.459 — ABNORMAL HIGH (ref 7.350–7.450)
pH, Arterial: 7.482 — ABNORMAL HIGH (ref 7.350–7.450)
pO2, Arterial: 434 mmHg — ABNORMAL HIGH (ref 83.0–108.0)
pO2, Arterial: 457 mmHg — ABNORMAL HIGH (ref 83.0–108.0)
pO2, Arterial: 460 mmHg — ABNORMAL HIGH (ref 83.0–108.0)
pO2, Arterial: 467 mmHg — ABNORMAL HIGH (ref 83.0–108.0)
pO2, Arterial: 490 mmHg — ABNORMAL HIGH (ref 83.0–108.0)
pO2, Arterial: 498 mmHg — ABNORMAL HIGH (ref 83.0–108.0)
pO2, Arterial: 536 mmHg — ABNORMAL HIGH (ref 83.0–108.0)

## 2019-06-03 LAB — CBC
HCT: 25.8 % — ABNORMAL LOW (ref 39.0–52.0)
HCT: 22.5 % — ABNORMAL LOW (ref 39.0–52.0)
Hemoglobin: 8.6 g/dL — ABNORMAL LOW (ref 13.0–17.0)
Hemoglobin: 7.6 g/dL — ABNORMAL LOW (ref 13.0–17.0)
MCH: 30.9 pg (ref 26.0–34.0)
MCH: 31.3 pg (ref 26.0–34.0)
MCHC: 33.3 g/dL (ref 30.0–36.0)
MCHC: 33.8 g/dL (ref 30.0–36.0)
MCV: 92.8 fL (ref 80.0–100.0)
MCV: 92.6 fL (ref 80.0–100.0)
Platelets: 34 10*3/uL — ABNORMAL LOW (ref 150–400)
Platelets: 39 10*3/uL — ABNORMAL LOW (ref 150–400)
RBC: 2.78 MIL/uL — ABNORMAL LOW (ref 4.22–5.81)
RBC: 2.43 MIL/uL — ABNORMAL LOW (ref 4.22–5.81)
RDW: 15.9 % — ABNORMAL HIGH (ref 11.5–15.5)
RDW: 15.3 % (ref 11.5–15.5)
WBC: 15.6 10*3/uL — ABNORMAL HIGH (ref 4.0–10.5)
WBC: 9.2 10*3/uL (ref 4.0–10.5)
nRBC: 0.2 % (ref 0.0–0.2)
nRBC: 0.2 % (ref 0.0–0.2)

## 2019-06-03 LAB — COMPREHENSIVE METABOLIC PANEL
ALT: 23 U/L (ref 0–44)
ALT: 18 U/L (ref 0–44)
AST: 47 U/L — ABNORMAL HIGH (ref 15–41)
AST: 43 U/L — ABNORMAL HIGH (ref 15–41)
Albumin: 2.1 g/dL — ABNORMAL LOW (ref 3.5–5.0)
Albumin: 1.7 g/dL — ABNORMAL LOW (ref 3.5–5.0)
Alkaline Phosphatase: 85 U/L (ref 38–126)
Alkaline Phosphatase: 61 U/L (ref 38–126)
Anion gap: 10 (ref 5–15)
Anion gap: 9 (ref 5–15)
BUN: 38 mg/dL — ABNORMAL HIGH (ref 8–23)
BUN: 48 mg/dL — ABNORMAL HIGH (ref 8–23)
CO2: 23 mmol/L (ref 22–32)
CO2: 23 mmol/L (ref 22–32)
Calcium: 7.6 mg/dL — ABNORMAL LOW (ref 8.9–10.3)
Calcium: 6.9 mg/dL — ABNORMAL LOW (ref 8.9–10.3)
Chloride: 104 mmol/L (ref 98–111)
Chloride: 107 mmol/L (ref 98–111)
Creatinine, Ser: 1.31 mg/dL — ABNORMAL HIGH (ref 0.61–1.24)
Creatinine, Ser: 1.61 mg/dL — ABNORMAL HIGH (ref 0.61–1.24)
GFR calc Af Amer: >60 mL/min (ref >60)
GFR calc Af Amer: 50 mL/min — ABNORMAL LOW (ref >60)
GFR calc non Af Amer: 55 mL/min — ABNORMAL LOW (ref >60)
GFR calc non Af Amer: 43 mL/min — ABNORMAL LOW (ref >60)
Glucose, Bld: 178 mg/dL — ABNORMAL HIGH (ref 70–99)
Glucose, Bld: 120 mg/dL — ABNORMAL HIGH (ref 70–99)
Potassium: 4.7 mmol/L (ref 3.5–5.1)
Potassium: 5.2 mmol/L — ABNORMAL HIGH (ref 3.5–5.1)
Sodium: 137 mmol/L (ref 135–145)
Sodium: 139 mmol/L (ref 135–145)
Total Bilirubin: 1.7 mg/dL — ABNORMAL HIGH (ref 0.3–1.2)
Total Bilirubin: 1.6 mg/dL — ABNORMAL HIGH (ref 0.3–1.2)
Total Protein: 5 g/dL — ABNORMAL LOW (ref 6.5–8.1)
Total Protein: 3.8 g/dL — ABNORMAL LOW (ref 6.5–8.1)

## 2019-06-03 LAB — PREPARE RBC (CROSSMATCH)

## 2019-06-03 LAB — BODY FLUID CELL COUNT WITH DIFFERENTIAL
Lymphs, Fluid: 1 %
Monocyte-Macrophage-Serous Fluid: 7 % — ABNORMAL LOW (ref 50–90)
Neutrophil Count, Fluid: 92 % — ABNORMAL HIGH (ref 0–25)
Total Nucleated Cell Count, Fluid: 64 cu mm (ref 0–1000)

## 2019-06-03 LAB — PROTIME-INR
INR: 1.4 — ABNORMAL HIGH (ref 0.8–1.2)
INR: 1.5 — ABNORMAL HIGH (ref 0.8–1.2)
Prothrombin Time: 16.5 seconds — ABNORMAL HIGH (ref 11.4–15.2)
Prothrombin Time: 18 seconds — ABNORMAL HIGH (ref 11.4–15.2)

## 2019-06-03 LAB — ECHO INTRAOPERATIVE TEE
Height: 67 in
Weight: 2543.23 oz

## 2019-06-03 LAB — GLUCOSE, CAPILLARY
Glucose-Capillary: 111 mg/dL — ABNORMAL HIGH (ref 70–99)
Glucose-Capillary: 142 mg/dL — ABNORMAL HIGH (ref 70–99)
Glucose-Capillary: 143 mg/dL — ABNORMAL HIGH (ref 70–99)
Glucose-Capillary: 172 mg/dL — ABNORMAL HIGH (ref 70–99)
Glucose-Capillary: 172 mg/dL — ABNORMAL HIGH (ref 70–99)
Glucose-Capillary: 179 mg/dL — ABNORMAL HIGH (ref 70–99)

## 2019-06-03 LAB — HEMOGLOBIN AND HEMATOCRIT, BLOOD
HCT: 23.5 % — ABNORMAL LOW (ref 39.0–52.0)
Hemoglobin: 7.8 g/dL — ABNORMAL LOW (ref 13.0–17.0)

## 2019-06-03 LAB — PHOSPHORUS: Phosphorus: 2.9 mg/dL (ref 2.5–4.6)

## 2019-06-03 LAB — APTT
aPTT: 60 seconds — ABNORMAL HIGH (ref 24–36)
aPTT: 67 seconds — ABNORMAL HIGH (ref 24–36)
aPTT: 43 seconds — ABNORMAL HIGH (ref 24–36)
aPTT: 57 seconds — ABNORMAL HIGH (ref 24–36)

## 2019-06-03 LAB — LACTATE DEHYDROGENASE: LDH: 1218 U/L — ABNORMAL HIGH (ref 98–192)

## 2019-06-03 LAB — HEPARIN LEVEL (UNFRACTIONATED): Heparin Unfractionated: 0.19 IU/mL — ABNORMAL LOW (ref 0.30–0.70)

## 2019-06-03 LAB — LACTIC ACID, PLASMA
Lactic Acid, Venous: 0.9 mmol/L (ref 0.5–1.9)
Lactic Acid, Venous: 1 mmol/L (ref 0.5–1.9)

## 2019-06-03 LAB — FIBRINOGEN
Fibrinogen: 717 mg/dL — ABNORMAL HIGH (ref 210–475)
Fibrinogen: 486 mg/dL — ABNORMAL HIGH (ref 210–475)

## 2019-06-03 SURGERY — REMOVAL, CARDIAC ASSIST DEVICE, IMPELLA
Anesthesia: General

## 2019-06-03 MED ORDER — SODIUM CHLORIDE 0.9% IV SOLUTION: Freq: Once | INTRAVENOUS | Status: AC

## 2019-06-03 MED ORDER — ATORVASTATIN CALCIUM 10 MG PO TABS
10.0000 mg | ORAL_TABLET | Freq: Every day | ORAL | Status: DC
  Administered 2019-06-03: 18:00:00 10 mg
  Filled 2019-06-03: qty 1

## 2019-06-03 MED ORDER — METHYLPREDNISOLONE SODIUM SUCC 125 MG IJ SOLR
60.0000 mg | Freq: Four times a day (QID) | INTRAMUSCULAR | Status: DC
  Administered 2019-06-03: 60 mg via INTRAVENOUS
  Filled 2019-06-03: qty 2

## 2019-06-03 MED ORDER — ROCURONIUM BROMIDE 10 MG/ML (PF) SYRINGE
PREFILLED_SYRINGE | INTRAVENOUS | Status: DC | PRN
  Administered 2019-06-03 (×3): 50 mg via INTRAVENOUS

## 2019-06-03 MED ORDER — ALBUMIN HUMAN 5 % IV SOLN
INTRAVENOUS | Status: AC
  Filled 2019-06-03: qty 250

## 2019-06-03 MED ORDER — ALBUMIN HUMAN 5 % IV SOLN
INTRAVENOUS | Status: AC
  Administered 2019-06-03: 12.5 g
  Filled 2019-06-03: qty 250

## 2019-06-03 MED ORDER — HEPARIN SODIUM (PORCINE) 1000 UNIT/ML IJ SOLN
INTRAMUSCULAR | Status: DC | PRN
  Administered 2019-06-03: 2000 [IU] via INTRAVENOUS

## 2019-06-03 MED ORDER — FLUCONAZOLE IN SODIUM CHLORIDE 400-0.9 MG/200ML-% IV SOLN
400.0000 mg | INTRAVENOUS | Status: DC
  Administered 2019-06-04: 12:00:00 400 mg via INTRAVENOUS
  Filled 2019-06-03: qty 200

## 2019-06-03 MED ORDER — HEPARIN SODIUM (PORCINE) 1000 UNIT/ML IJ SOLN
INTRAMUSCULAR | Status: AC
  Filled 2019-06-03: qty 1

## 2019-06-03 MED ORDER — ALBUMIN HUMAN 5 % IV SOLN
12.5000 g | Freq: Once | INTRAVENOUS | Status: AC
  Administered 2019-06-03: 12.5 g via INTRAVENOUS

## 2019-06-03 MED ORDER — FLUCONAZOLE IN SODIUM CHLORIDE 400-0.9 MG/200ML-% IV SOLN
800.0000 mg | Freq: Once | INTRAVENOUS | Status: AC
  Administered 2019-06-03: 800 mg via INTRAVENOUS
  Filled 2019-06-03: qty 400

## 2019-06-03 MED ORDER — LACTATED RINGERS IV SOLN
INTRAVENOUS | Status: DC | PRN
  Administered 2019-06-03: 13:00:00 via INTRAVENOUS

## 2019-06-03 MED ORDER — SODIUM CHLORIDE 0.9 % IV SOLN
INTRAVENOUS | Status: AC
  Filled 2019-06-03: qty 1.2

## 2019-06-03 MED ORDER — HEPARIN (PORCINE) 25000 UT/250ML-% IV SOLN
1200.0000 [IU]/h | INTRAVENOUS | Status: DC
  Administered 2019-06-03: 1200 [IU]/h via INTRAVENOUS
  Filled 2019-06-03: qty 250

## 2019-06-03 MED ORDER — EPINEPHRINE HCL 5 MG/250ML IV SOLN IN NS
INTRAVENOUS | Status: DC | PRN
  Administered 2019-06-03: 1 ug/min via INTRAVENOUS

## 2019-06-03 MED ORDER — ALBUMIN HUMAN 5 % IV SOLN
12.5000 g | Freq: Once | INTRAVENOUS | Status: AC
  Administered 2019-06-03: 06:00:00 12.5 g via INTRAVENOUS

## 2019-06-03 MED ORDER — 0.9 % SODIUM CHLORIDE (POUR BTL) OPTIME
TOPICAL | Status: DC | PRN
  Administered 2019-06-03: 2000 mL

## 2019-06-03 MED ORDER — SODIUM CHLORIDE 0.9 % IV SOLN
20.0000 ug | Freq: Once | INTRAVENOUS | Status: AC
  Administered 2019-06-03: 20 ug via INTRAVENOUS
  Filled 2019-06-03: qty 5

## 2019-06-03 SURGICAL SUPPLY — 78 items
ADAPTER CARDIO PERF ANTE/RETRO (ADAPTER) IMPLANT
ADH SKN CLS APL DERMABOND .7 (GAUZE/BANDAGES/DRESSINGS) ×2
ADPR PRFSN 84XANTGRD RTRGD (ADAPTER)
ANCHOR CATH FOLEY SECURE (MISCELLANEOUS) ×3 IMPLANT
ANTEGRADE CPLG (MISCELLANEOUS) IMPLANT
APL SRG 7X2 LUM MLBL SLNT (VASCULAR PRODUCTS)
APPLICATOR TIP COSEAL (VASCULAR PRODUCTS) IMPLANT
BLADE SURG 15 STRL LF DISP TIS (BLADE) IMPLANT
BLADE SURG 15 STRL SS (BLADE)
CANISTER SUCT 3000ML PPV (MISCELLANEOUS) ×3 IMPLANT
CANNULA VRC MALB SNGL STG 24FR (MISCELLANEOUS) ×2 IMPLANT
CATH HEART VENT LEFT (CATHETERS) IMPLANT
CATH THORACIC 28FR (CATHETERS) IMPLANT
CLIP VESOCCLUDE LG 6/CT (CLIP) ×9 IMPLANT
CLIP VESOCCLUDE MED 24/CT (CLIP) ×3 IMPLANT
CLIP VESOCCLUDE SM WIDE 24/CT (CLIP) ×3 IMPLANT
CONN 3/8X3/8 GISH STERILE (MISCELLANEOUS) ×3 IMPLANT
CONN ST 1/4X3/8  BEN (MISCELLANEOUS)
CONN ST 1/4X3/8 BEN (MISCELLANEOUS) IMPLANT
CONN Y 3/8X3/8X3/8  BEN (MISCELLANEOUS) ×1
CONN Y 3/8X3/8X3/8 BEN (MISCELLANEOUS) ×2 IMPLANT
DERMABOND ADVANCED (GAUZE/BANDAGES/DRESSINGS) ×1
DERMABOND ADVANCED .7 DNX12 (GAUZE/BANDAGES/DRESSINGS) ×2 IMPLANT
DRAIN CHANNEL 10M FLAT 3/4 FLT (DRAIN) ×3 IMPLANT
DRAIN CHANNEL 28F RND 3/8 FF (WOUND CARE) ×3 IMPLANT
DRAPE SLUSH/WARMER DISC (DRAPES) ×3 IMPLANT
DRSG AQUACEL AG ADV 3.5X 4 (GAUZE/BANDAGES/DRESSINGS) ×3 IMPLANT
DRSG AQUACEL AG ADV 3.5X 6 (GAUZE/BANDAGES/DRESSINGS) ×3 IMPLANT
DRSG TEGADERM 4X4.75 (GAUZE/BANDAGES/DRESSINGS) ×9 IMPLANT
ELECT BLADE 4.0 EZ CLEAN MEGAD (MISCELLANEOUS) ×3
ELECT REM PT RETURN 9FT ADLT (ELECTROSURGICAL) ×6
ELECTRODE BLDE 4.0 EZ CLN MEGD (MISCELLANEOUS) ×2 IMPLANT
ELECTRODE REM PT RTRN 9FT ADLT (ELECTROSURGICAL) ×4 IMPLANT
EVACUATOR SILICONE 100CC (DRAIN) ×3 IMPLANT
GAUZE SPONGE 4X4 12PLY STRL (GAUZE/BANDAGES/DRESSINGS) ×3 IMPLANT
GLOVE BIOGEL PI IND STRL 6.5 (GLOVE) ×2 IMPLANT
GLOVE BIOGEL PI INDICATOR 6.5 (GLOVE) ×1
GLOVE NEODERM STRL 7.5 LF PF (GLOVE) ×4 IMPLANT
GLOVE SURG NEODERM 7.5  LF PF (GLOVE) ×2
GOWN STRL REUS W/ TWL LRG LVL3 (GOWN DISPOSABLE) ×8 IMPLANT
GOWN STRL REUS W/TWL LRG LVL3 (GOWN DISPOSABLE) ×12
HEMOSTAT SURGICEL 2X14 (HEMOSTASIS) IMPLANT
INSERT FOGARTY XLG (MISCELLANEOUS) IMPLANT
KIT BASIN OR (CUSTOM PROCEDURE TRAY) ×3 IMPLANT
KIT TURNOVER KIT B (KITS) ×3 IMPLANT
LEAD PACING MYOCARDI (MISCELLANEOUS) IMPLANT
NS IRRIG 1000ML POUR BTL (IV SOLUTION) ×6 IMPLANT
PACK CHEST (CUSTOM PROCEDURE TRAY) ×3 IMPLANT
PACK OPEN HEART (CUSTOM PROCEDURE TRAY) IMPLANT
PAD ARMBOARD 7.5X6 YLW CONV (MISCELLANEOUS) ×3 IMPLANT
PAD ELECT DEFIB RADIOL ZOLL (MISCELLANEOUS) ×3 IMPLANT
POSITIONER HEAD DONUT 9IN (MISCELLANEOUS) ×3 IMPLANT
SET CANNULATION TOURNIQUET (MISCELLANEOUS) ×3 IMPLANT
SHEATH AVANTI 11CM 5FR (SHEATH) IMPLANT
SOL PREP POV-IOD 4OZ 10% (MISCELLANEOUS) ×3 IMPLANT
SPONGE LAP 18X18 RF (DISPOSABLE) ×6 IMPLANT
STAPLER VISISTAT 35W (STAPLE) IMPLANT
SUT PROLENE 3 0 RB 1 (SUTURE) IMPLANT
SUT PROLENE 4 0 RB 1 (SUTURE) ×6
SUT PROLENE 4-0 RB1 .5 CRCL 36 (SUTURE) ×4 IMPLANT
SUT PROLENE 5 0 C1 (SUTURE) IMPLANT
SUT SILK  1 MH (SUTURE) ×4
SUT SILK 1 MH (SUTURE) ×8 IMPLANT
SUT SILK 2 0 SH CR/8 (SUTURE) ×3 IMPLANT
SUT TEM PAC WIRE 2 0 SH (SUTURE) IMPLANT
SUT VIC AB 0 CTX 36 (SUTURE) ×3
SUT VIC AB 0 CTX36XBRD ANTBCTR (SUTURE) ×2 IMPLANT
SUT VIC AB 3-0 SH 27 (SUTURE) ×9
SUT VIC AB 3-0 SH 27X BRD (SUTURE) ×6 IMPLANT
SUT VIC AB 3-0 SH 8-18 (SUTURE) ×3 IMPLANT
SYSTEM SAHARA CHEST DRAIN ATS (WOUND CARE) IMPLANT
TAPE CLOTH SURG 4X10 WHT LF (GAUZE/BANDAGES/DRESSINGS) ×3 IMPLANT
TOWEL GREEN STERILE (TOWEL DISPOSABLE) ×3 IMPLANT
UNDERPAD 30X30 (UNDERPADS AND DIAPERS) ×3 IMPLANT
VENT LEFT HEART 12002 (CATHETERS)
VRC MALLEABLE SINGLE STG 24FR (MISCELLANEOUS) ×3
WATER STERILE IRR 1000ML POUR (IV SOLUTION) ×6 IMPLANT
YANKAUER SUCT BULB TIP NO VENT (SUCTIONS) ×3 IMPLANT

## 2019-06-03 NOTE — Progress Notes (Signed)
Forceps used during bronch to assist with clot removal.

## 2019-06-03 NOTE — Progress Notes (Signed)
Echocardiogram 2D Echocardiogram has been performed.  Oneal Deputy Folasade Mooty 06/09/2019, 8:40 AM   Preliminary echo has been performed, will continue when Impella rep arrives.

## 2019-06-03 NOTE — Progress Notes (Signed)
Patient ID: Raymond Chavez, male   DOB: Dec 09, 1950, 68 y.o.   MRN: [id number] TCTS Evening Rounds:  Hemodynamically stable on ECMO Non-pulsatile arterial tracing. CVP 6-7  CRRT restarted and keeping even.  CT 1100 since surgery but decreasing since plts given.  CBC    Component Value Date/Time   WBC 9.2 06/16/19 1653   RBC 2.43 (L) 2019/06/16 1653   HGB 7.6 (L) 06/16/19 1653   HCT 22.5 (L) 06-16-19 1653   PLT 39 (L) 06-16-19 1653   MCV 92.6 Jun 16, 2019 1653   MCH 31.3 06-16-19 1653   MCHC 33.8 16-Jun-2019 1653   RDW 15.3 Jun 16, 2019 1653   LYMPHSABS 0.4 (L) 06/02/2019 1150   MONOABS 0.4 06/02/2019 1150   EOSABS 0.0 06/02/2019 1150   BASOSABS 0.0 06/02/2019 1150   Transfusing 1 unit PRBC's

## 2019-06-03 NOTE — Progress Notes (Signed)
NAME:  Kiyan Burmester, MRN:  [id number], DOB:  June 15, 1951, LOS: 12 ADMISSION DATE:  05/01/2019, CONSULTATION DATE:  05/19/2019 REFERRING MD:  Billy Fischer  CHIEF COMPLAINT:  SOB   Brief History   Mathew Postiglione is a 68 y.o. male who was admitted with acute hypoxic respiratory failure due to acute pulmonary edema in setting of NSTEMI, cardiogenic shock, possible PNA, He required intubation in the ED after failing BiPAP. On impella for hemodynamic support, ECMO on 11/4 CVTS and cardiology are discussing about CABG, LVAD  Past Medical History  HTN, CAD.  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  10/27-heparin stopped due to low platelets.  HIT panel sent.  Starting bivalirudin.  Off Levophed.  On minimal vent support but failed weaning trials 10/28 Bronchoscopy  10/29 Started on CRRT 10/30 Bronchoscopy with mucous plug removal 11/1 Extubated 11/4 progressive shock, respiratory failure requiring reintubation.  Pulmonary hemorrhage, heparin held.  Antibiotics broadened empirically. 11/4 cannulated and [facility] ECMO initiated 5176 11/5 paralytics stopped   Consults:  Cardiology PCCM Nephrology   Procedures:  ETT 10/22 >11/1 Swan 10/22 > Impella 10/23 >> 10/24 Changed to 5.0 via right axillary >>  Vas cath 10/29  Aline 11/3 ECMO 11/4 >>   Significant Diagnostic Tests:  CTA chest 10/22 > no PE, b/l basilar consolidation, diffuse interlobular septal thickening with GGO's, b/l hilar adenopathy. Echo 11/2: LVEF 15%, impella in place 10/27 UE venous doppler: Right: No evidence of deep vein thrombosis in the upper extremity. However, unable to visualize the IJV, axillary. No evidence of superficial vein thrombosis in the upper extremity. However, unable to visualize the IJV, axillary. No evidence of thrombosis in the  . However, unable to visualize the IJV, axillary. Left: No evidence of thrombosis in the subclavian.     Micro Data:  Blood 10/22, 10/24, 10/28, 10/29 >>> NGTD Sputum 10/24 >>> Neg Urine 10/22 >>>Neg Urine Strep > Neg RVP 10/22 > Neg MRSA PCR > Neg SARS CoV2 10/22 > neg. BAL 10/28 > Enterococcus faecalis (40 K) Blood 10/29: negative Tracheal aspirate 11/8 >> budding yeast >> BAL 11/9 >>   Antimicrobials:  Vanc 10/22 >10/25, 10/28>10/31 Cefepime 10/22 >10/26, 10/29>11/1 Ampicillin 11/1>11/4 vanc 11/4-> Merrem11/4-> Anidulafungin 11/9 >>   Interim history/subjective:  More good volume removal over the last 24 hours through CVVHD, now 4.4 L positive net Note tracheal aspirate fungal bodies   Objective:  Blood pressure 92/71, pulse 73, temperature 97.9 F (36.6 C), temperature source Oral, resp. rate 13, height _0  (1.702 m), weight 72.1 kg, SpO2 100 %. CVP:  [3 mmHg-10 mmHg] 6 mmHg  Vent Mode: PRVC FiO2 (%):  [30 %] 30 % Set Rate:  [10 bmp] 10 bmp Vt Set:  [400 mL] 400 mL PEEP:  [5 cmH20] 5 cmH20 Plateau Pressure:  [19 cmH20-22 cmH20] 19 cmH20   Intake/Output Summary (Last 24 hours) at 06/13/2019 0916 Last data filed at 06/17/2019 0900 Gross per 24 hour  Intake 4257.71 ml  Output 8310 ml  Net -4052.29 ml   Filed Weights   06/01/19 0615 06/02/19 0414 06/11/2019 0430  Weight: 76.3 kg 74.5 kg 72.1 kg   Physical Exam: General: Critically ill, sedated on fentanyl and Versed, ECMO circuit intact HENT: ET tube in place, no clots in the ventilator circuit Eyes: Pupils equal, no icterus  Respiratory: Coarse bilateral breath sounds, inspiratory crackles, no wheezing.  His effective tidal volumes have dropped into the 250-270s Cardiovascular: Mechanical noise GI: Nondistended, positive bowel sounds Extremities no lower extremity edema (much improved) Neuro: Sedated, does grimace with pain, has a spontaneous respiratory effort, coughs with suctioning  Chest x-ray 11/9,  reviewed, persistent, possibly even worse bilateral interstitial alveolar pulmonary infiltrates without any evidence of consolidation  Assessment & Plan:   Acute hypoxic respiratory failure requiring intubation -multifactorial  Pulmonary hemorrhage, appears to have stabilized 11/5 Aspiration event, with presumed pneumonia ARDS Hiccups Extubated 11/2 and reintubated 11/4 am Chest x-ray 11/7 reviewed, now with diffuse bilateral interstitial infiltrates consistent with an ALI + pulmonary edema pattern, certainly increased inferiorly.  With prior films Plan  -Continue PRVC 6 cc/kg, peak and plateau pressures have increased, effective tidal volumes 250-270.  Decreased compliance -Given his critical illness, the fungal elements noted on his tracheal aspirate I believe we are obliged to empirically treat with antifungals.  I will ask pharmacy to start anidulafungin 11/9 -Plan for bronchoscopy with BAL for culture data, airway inspection today -Steroids were restarted 11/9 to hopefully impact his ARDS.  Unclear whether is any role here.  I am concerned about empiric treatment since we now know he is at risk for fungal pneumonia.  I will stop these for now, discussed with Dr. 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 Aspiration event, probable recurrent pneumonia 11/4.  At risk acute lung injury/pneumonitis Plan -Norepinephrine titrated for MAP 65 -Continue empiric meropenem, vancomycin (MRSA nasal swab positive), day 6 -Antifungals added on 11/9 given tracheal aspirate data -Plan for BAL 11/9 -Follow chest x-ray, WBC   Acute Kidney Injury  Plan -CVVHD.  Would continue aggressive volume removal.  He has reached his "dry weight", remains 4 L positive for the hospitalization.  His respiratory status will benefit from being as dry as possible as long as his hemodynamics will tolerate   Acute on chronic normocytic anemia:  -Follow CBC, goal hemoglobin > 7.0 -Continue empiric PPI infusion for now.  Suspect that blood loss was pulmonary not GI -PRBC as needed Thrombocytopenia HIT panel negative -Platelet replacement as needed  ?ileus:  -Bowel regimen  Goals of care:  Goals of care discussions ongoing depending on clinical progress  Best Practice:  Diet: npo Pain/Anxiety/Delirium protocol: Fentanyl, Versed infusions VAP protocol: yes DVT prophylaxis: SCD's  GI prophylaxis: PPI. Glucose control: SSI. Mobility: bedrest Code Status: Full. Family Communication: Ongoing Disposition: ICU.  Labs   CBC: Recent Labs  Lab 05/28/19 0329  05/27/2019 0211  06/02/2019 2308  05/30/19 0550  06/02/19 0303  06/02/19 1150 06/02/19 1632  06/02/19 2302  05/30/2019 0110 06/18/2019 0235 06/07/2019 0407 06/14/2019 0615 06/18/2019 0646  WBC 29.2*   < > 30.1*   < > 19.3*   < > 15.6*   < >  16.0*  --  14.9* 14.7*  --  16.5*  --   --   --  15.6*  --   --   NEUTROABS 22.8*  --  22.3*  --  15.6*  --  12.9*  --   --   --  13.6*  --   --   --   --   --   --   --   --   --   HGB 8.7*   < > 5.9*   < > 11.0*   < > 9.0*   < > 8.6*   < > 8.1* 7.9*   < > 8.9*   < > 8.2* 7.8* 8.6* 7.8* 7.8*  HCT 26.8*   < > 18.0*   < > 31.7*   < > 26.4*   < > 25.4*   < > 24.1* 23.8*    < > 26.5*   < > 24.0* 23.0* 25.8* 23.0* 23.5*  MCV 90.8   < > 91.4   < > 88.5   < > 90.1   < > 93.0  --  93.1 93.3  --  92.0  --   --   --  92.8  --   --   PLT 93*   < > 54*   < > 46*   < > 97*   < > 36*  --  32* 35*  --  34*  --   --   --  34*  --   --    < > = values in this interval not displayed.   Basic Metabolic Panel: Recent Labs  Lab 05/30/19 0903  05/30/19 1626  05/31/19 0308  05/31/19 1618  06/01/19 0316  06/01/19 1555  06/02/19 0303  06/02/19 1150 06/02/19 1632  06/23/2019 0010 06/18/2019 0110 06/11/2019 0235 05/27/2019 0407 06/02/2019 0615  NA  --    < > 138   < > 137   < > 136   < > 137   < > 140   < > 138   < > 137 136   < > 137 136 137 137 137  K  --    < > 4.7   < > 4.7   < > 4.6   < > 4.6   < > 4.8   < > 4.7   < > 4.6 4.7   < > 4.8 4.7 4.6 4.7 4.7  CL  --    < > 106  --  107  --  103   < > 105  --  107  --  105  --  104 104  --   --   --   --  104  --   CO2  --    < > 25  --  24  --  23   < > 24  --  24  --  25  --  24 25  --   --   --   --  23  --   GLUCOSE  --    < > 152*  --  184*  --  172*   < > 210*  --  176*  --  208*  --  199* 164*  --   --   --   --  178*  --   BUN  --    < > 37*  --  33*  --  31*   < > 32*  --  30*  --  31*  --  34* 35*  --   --   --   --  38*  --   CREATININE  --    < > 1.69*  --  1.45*  --  1.43*   < > 1.35*  --  1.26*  --  1.29*  --  1.33* 1.32*  --   --   --   --  1.31*  --   CALCIUM  --    < > 6.9*  --  6.8*  --  7.2*   < > 7.2*  --  7.1*  --  7.3*  --  8.0* 7.7*  --   --   --   --  7.6*  --   MG 2.4  --  2.5*  --  2.3  --  2.4  --  2.6*  --  2.4  --   --   --   --   --   --   --   --   --   --   --   PHOS 5.1*  --  4.5  --  3.6  --  3.4  --   --   --   --   --  3.1  --   --   --   --   --   --   --   --   --    < > = values in this interval not displayed.   GFR: Estimated Creatinine Clearance: 49.8 mL/min (A) (by C-G formula based on SCr of 1.31 mg/dL (H)). Recent Labs  Lab 05/28/19 0342  06/01/19 1555 06/02/19 0303 06/02/19 1150  06/02/19 1632 06/02/19 2302 05/26/2019 0407  PROCALCITON 1.26  --   --   --   --   --   --   --   WBC  --    < > 19.3* 16.0* 14.9* 14.7* 16.5* 15.6*  LATICACIDVEN  --    < > 0.9 0.9  --  0.9  --  0.9   < > = values in this interval not displayed.   Liver Function Tests: Recent Labs  Lab 06/01/19 1555 06/02/19 0303 06/02/19 1150 06/02/19 1632 06/22/2019 0407  AST 43* 39 38 42* 47*  ALT _0 ALKPHOS 62 67 68 71 85  BILITOT 2.1* 2.0* 2.0* 1.8* 1.7*  PROT 4.6* 4.8* 4.7* 4.7* 5.0*  ALBUMIN 2.3* 2.3* 2.1* 2.1* 2.1*   No results for input(s): LIPASE, AMYLASE in the last 168 hours. No results for input(s): AMMONIA in the last 168 hours. ABG    Component Value Date/Time   PHART 7.403 06/11/2019 0615   PCO2ART 45.3 06/13/2019 0615   PO2ART 457.0 (H) 06/12/2019 0615   HCO3 28.2 (H) 06/16/2019 0615   TCO2 30 06/01/2019 0615   ACIDBASEDEF 1.0 06/24/2019 0255   O2SAT 100.0 05/26/2019 0615    Coagulation Profile: Recent Labs  Lab 06/01/19 0316 06/01/19 1555 06/02/19 0303 06/02/19 1632 06/20/2019 0407  INR 1.9* 1.6* 1.5* 1.5* 1.4*   Cardiac Enzymes: No results for input(s): CKTOTAL, CKMB, CKMBINDEX, TROPONINI in the last 168 hours. HbA1C: Hgb A1c MFr Bld  Date/Time Value Ref Range Status  05/06/2019 11:25 PM 5.5 4.8 - 5.6 % Final    Comment:    (NOTE) Pre diabetes:          5.7%-6.4% Diabetes:              >6.4% Glycemic control for   <7.0% adults  with diabetes    CBG: Recent Labs  Lab 06/02/19 1519 06/02/19 2009 06/02/19 2256 05/26/2019 0410 06/02/2019 0737  GLUCAP 150* 146* 138* 172* 142*    Independent critical care time 40 minutes  Baltazar Apo, MD, PhD 06/11/2019, 9:16 AM  Pulmonary and Critical Care [phone] or if no answer [phone]

## 2019-06-03 NOTE — Progress Notes (Signed)
[facility] NEPHROLOGY PROGRESS NOTE  Assessment/ Plan: Pt is a 68 y.o. yo male with cardiogenic shock who underwent placement of Impella on 10/24, on milrinone and Levophed, cardiac cath with three-vessel disease and echocardiogram revealing EF of 20%, respiratory failure, consulted for AKI.  Baseline creatinine level around 1.3.  #Acute kidney injury on CKD: due to cardiorenal syndrome and contrast nephropathy, complicated by cardiac arrest on 11/3. CRRT since 10/30.  Currently tolerating CRRT.  Continue all 4K solution at current flow settings.  UF reduced to 50 mL/h net negative earlier this morning with by cardiology. No clotting, systemic heparinization.Marland Kitchen    #Cardiac arrest/cardiogenic shock: EF 20%, has Impella and now on ECMO.  Had  cardiac arrest on 11/3.   Heart failure team is following.    #Acute respiratory failure with hypoxia: Due to CHF and pneumonia. On mechanical ventilation and ECMO. CCM following  #CAD: NSTEMI, CABG with severe three-vessel disease.   #Hypophosphatemia, hypocalcemia: Replete calcium.  Check phosphorus level   Discussed with ICU team.  Subjective: No acute events overnight.  Stable on ECMO, CRRT, Impella.  Afebrile.  7.6 L UF yesterday with approximately 4 L net negative.  Morning labs of K4.7.  Objective Vital signs in last 24 hours: Vitals:   05/27/2019 0630 05/26/2019 0700 05/30/2019 0742 06/18/2019 0800  BP:      Pulse:    73  Resp: '16 14  13  ' Temp:   97.9 F (36.6 C)   TempSrc:   Oral   SpO2: 100% 100%  100%  Weight:      Height:       Weight change: -2.4 kg  Intake/Output Summary (Last 24 hours) at 06/18/2019 0834 Last data filed at 06/02/2019 0800 Gross per 24 hour  Intake 4373.78 ml  Output 8596 ml  Net -4222.22 ml       Labs: Basic Metabolic Panel: Recent Labs  Lab 05/31/19 0308  05/31/19 1618  06/02/19 0303  06/02/19 1150 06/02/19 1632  06/04/2019 0235 05/26/2019 0407 06/12/2019 0615  NA 137   < > 136   < > 138    < > 137 136   < > 137 137 137  K 4.7   < > 4.6   < > 4.7   < > 4.6 4.7   < > 4.6 4.7 4.7  CL 107  --  103   < > 105  --  104 104  --   --  104  --   CO2 24  --  23   < > 25  --  24 25  --   --  23  --   GLUCOSE 184*  --  172*   < > 208*  --  199* 164*  --   --  178*  --   BUN 33*  --  31*   < > 31*  --  34* 35*  --   --  38*  --   CREATININE 1.45*  --  1.43*   < > 1.29*  --  1.33* 1.32*  --   --  1.31*  --   CALCIUM 6.8*  --  7.2*   < > 7.3*  --  8.0* 7.7*  --   --  7.6*  --   PHOS 3.6  --  3.4  --  3.1  --   --   --   --   --   --   --    < > =  values in this interval not displayed.   Liver Function Tests: Recent Labs  Lab 06/02/19 1150 06/02/19 1632 06/04/2019 0407  AST 38 42* 47*  ALT '20 21 23  ' ALKPHOS 68 71 85  BILITOT 2.0* 1.8* 1.7*  PROT 4.7* 4.7* 5.0*  ALBUMIN 2.1* 2.1* 2.1*   No results for input(s): LIPASE, AMYLASE in the last 168 hours. No results for input(s): AMMONIA in the last 168 hours. CBC: Recent Labs  Lab 06/13/2019 2308  05/30/19 0550  06/02/19 0303  06/02/19 1150 06/02/19 1632  06/02/19 2302  06/20/2019 0407 06/04/2019 0615 06/20/2019 0646  WBC 19.3*   < > 15.6*   < > 16.0*  --  14.9* 14.7*  --  16.5*  --  15.6*  --   --   NEUTROABS 15.6*  --  12.9*  --   --   --  13.6*  --   --   --   --   --   --   --   HGB 11.0*   < > 9.0*   < > 8.6*   < > 8.1* 7.9*   < > 8.9*   < > 8.6* 7.8* 7.8*  HCT 31.7*   < > 26.4*   < > 25.4*   < > 24.1* 23.8*   < > 26.5*   < > 25.8* 23.0* 23.5*  MCV 88.5   < > 90.1   < > 93.0  --  93.1 93.3  --  92.0  --  92.8  --   --   PLT 46*   < > 97*   < > 36*  --  32* 35*  --  34*  --  34*  --   --    < > = values in this interval not displayed.   Cardiac Enzymes: No results for input(s): CKTOTAL, CKMB, CKMBINDEX, TROPONINI in the last 168 hours. CBG: Recent Labs  Lab 06/02/19 1519 06/02/19 2009 06/02/19 2256 06/09/2019 0410 06/19/2019 0737  GLUCAP 150* 146* 138* 172* 142*    Iron Studies: No results for input(s): IRON, TIBC,  TRANSFERRIN, FERRITIN in the last 72 hours. Studies/Results: Dg Chest 1 View  Result Date: 06/21/2019 CLINICAL DATA:  Endotracheal tube. EXAM: CHEST  1 VIEW COMPARISON:  June 02, 2019. FINDINGS: Stable cardiomediastinal silhouette. Stable endotracheal and nasogastric tubes. Stable bilateral jugular catheters. Stable right-sided PICC line. Stable left ventricular assist device. No pneumothorax is noted. Stable bilateral lung opacities are noted. Bony thorax is unremarkable. IMPRESSION: Stable support apparatus.  Stable bilateral lung opacities. Electronically Signed   By: Marijo Conception M.D.   On: 06/23/2019 07:44   Dg Chest Port 1 View  Result Date: 06/02/2019 CLINICAL DATA:  Patient status post cardiac surgery. EXAM: PORTABLE CHEST 1 VIEW COMPARISON:  Chest radiograph 06/01/2019 FINDINGS: Right upper extremity PICC line tip projects over the superior vena cava. Left IJ approach central venous catheter tip projects over the superior vena cava. ETT terminates in the mid trachea. Enteric tube courses inferior to the diaphragm. Mediastinal drains project over the mediastinum. Impella device stable in position. ECMO cannula projects over the mediastinum. Stable cardiomegaly. Similar diffuse bilateral heterogeneous pulmonary opacities. No pleural effusion or pneumothorax. IMPRESSION: Support apparatus as above. Similar diffuse bilateral airspace opacities which may represent edema. Electronically Signed   By: Lovey Newcomer M.D.   On: 06/02/2019 10:08    Medications: Infusions: .  prismasol BGK 4/2.5 400 mL/hr at 06/21/2019 0125  .  prismasol BGK 4/2.5 300 mL/hr  at 06/02/19 1659  . sodium chloride 10 mL/hr at 06/09/2019 0800  . sodium chloride Stopped (05/28/19 0127)  . amiodarone 30 mg/hr (06/01/2019 0800)  . cisatracurium (NIMBEX) infusion Stopped (05/30/19 0844)  . feeding supplement (VITAL 1.5 CAL) 50 mL/hr at 06/02/19 1900  . fentaNYL infusion INTRAVENOUS 250 mcg/hr (06/02/2019 0800)  . impella  catheter heparin 50 unit/mL in dextrose 5% 50,000 Units (05/23/19 0815)  . heparin 600 Units/hr (06/02/2019 0800)  . meropenem (MERREM) IV 1 g (06/04/2019 0818)  . midazolam 6 mg/hr (06/10/2019 0800)  . norepinephrine (LEVOPHED) Adult infusion 2 mcg/min (06/09/2019 0800)  . prismasol BGK 4/2.5 1,800 mL/hr at 06/19/2019 0810  . vancomycin 750 mg (06/02/19 6701)    Scheduled Medications: . acetaminophen  1,000 mg Oral Q6H   Or  . acetaminophen (TYLENOL) oral liquid 160 mg/5 mL  1,000 mg Per Tube Q6H  . acetaminophen (TYLENOL) oral liquid 160 mg/5 mL  650 mg Per Tube Once   Or  . acetaminophen  650 mg Rectal Once  . arformoterol  15 mcg Nebulization BID  . aspirin  81 mg Per Tube Daily  . atorvastatin  80 mg Per Tube q1800  . B-complex with vitamin C  1 tablet Per Tube Daily  . chlorhexidine gluconate (MEDLINE KIT)  15 mL Mouth Rinse BID  . Chlorhexidine Gluconate Cloth  6 each Topical Daily  . feeding supplement (PRO-STAT SUGAR FREE 64)  30 mL Per Tube QID  . guaiFENesin  30 mL Per Tube BID  . insulin aspart  1-3 Units Subcutaneous Q4H  . mouth rinse  15 mL Mouth Rinse 10 times per day  . methylPREDNISolone (SOLU-MEDROL) injection  60 mg Intravenous Q6H  . mupirocin ointment   Nasal BID  . pantoprazole  40 mg Intravenous Q12H  . sodium chloride flush  10-40 mL Intracatheter Q12H    have reviewed scheduled and prn medications.  Physical Exam:  General: Intubated and sedated, critically ill Heart:RRR, s1s2 nl Lungs: Bibasal coarse breath sound, no wheezing Abdomen:soft, Non-tender, non-distended Extremities: LE edema + Dialysis Access: Left IJ catheter site clean.  Multiple line for ECMO.  Rigley Niess B Brystal Kildow 06/24/2019,8:34 AM  LOS: 18 days

## 2019-06-03 NOTE — Progress Notes (Signed)
Pt turned and coughing, flows reflect temporary changes

## 2019-06-03 NOTE — Progress Notes (Signed)
  Echocardiogram Echocardiogram Transesophageal has been performed.  Raymond Chavez 06-24-2019, 3:12 PM

## 2019-06-03 NOTE — Progress Notes (Signed)
MD [name] wants to restart CRRT right now but keep even while given blood products. RN will continue to monitor.

## 2019-06-03 NOTE — Transfer of Care (Signed)
Immediate Anesthesia Transfer of Care Note  Patient: Raymond Chavez  Procedure(s) Performed: REMOVAL OF IMPELLA LEFT VENTRICULAR ASSIST DEVICE (N/A ) TRANSESOPHAGEAL ECHOCARDIOGRAM (TEE) (N/A ) Chest Tube Insertion INSERTION OF LEFT VENTRICLE VENT FOR ECMO  Patient Location: SICU  Anesthesia Type:General  Level of Consciousness: Patient remains intubated per anesthesia plan  Airway & Oxygen Therapy: Patient remains intubated per anesthesia plan and Patient placed on Ventilator (see vital sign flow sheet for setting)  Post-op Assessment: Report given to RN and Post -op Vital signs reviewed and stable  Post vital signs: Reviewed and stable  Last Vitals:  Vitals Value Taken Time  BP    Temp 36.3 C 06/14/2019 1548  Pulse 54 05/28/2019 1548  Resp 10 06/04/2019 1550  SpO2    Vitals shown include unvalidated device data.  Last Pain:  Vitals:   06/07/2019 1548  TempSrc: Core (Comment)  PainSc:          Complications: No apparent anesthesia complications

## 2019-06-03 NOTE — Procedures (Signed)
Video Bronchoscopy Procedure Note  Date of Operation: 06/07/2019  Pre-op Diagnosis: Acute respiratory failure, ARDS, bilateral pulmonary infiltrates  Post-op Diagnosis: Same  Surgeon: Baltazar Apo  Assistants: none  Anesthesia: conscious sedation, moderate sedation  Meds Given: Continuous fentanyl and Versed infusions (bolused  Operation: Flexible video fiberoptic bronchoscopy and biopsies.  Estimated Blood Loss: 0 cc  Complications: none noted  Indications and History: Raymond Chavez is 68 y.o. with acute respiratory failure in the setting of cardiogenic shock, acute renal failure, suspected ALI/ARDS and healthcare associated pneumonia.  He has had old clots after some initial alveolar hemorrhage/hemoptysis 5 days ago.  Recommendation was to perform video fiberoptic bronchoscopy with BAL for culture data and airway inspection. The procedure was performed urgently.    Description of Procedure: Sedation was ensured as indicated above. The video fiberoptic bronchoscope was introduced via the ET tube.  There was a large obstructing solid dark clot occluding the distal ET tube.  This could not be suctioned proximally.  Attempts to grab the clot with endobronchial forceps were unsuccessful as it was very friable.  Finally I was able to push the clot distally and back into the airway tree to clear the endotracheal tube of obstruction.  After doing so and airway inspection was performed.  The large airways, distal trachea, bilateral mainstem bronchi were all erythematous with evidence of inflammation and old blood.  There was no new bleeding source noted.  Inspection of the segmental bronchi did not show any bleeding, purulent secretions.  A right upper lobe BAL was performed to be sent for culture data.   Samples: 1. Bronchial washings from right upper lobe  Plans:  -Await culture data from BAL as above, special attention to fungal cultures given his tracheal aspirate results. -Suspect that  he will need a dedicated ET tube change given the old clot burden, this episode of occlusion.  Unclear that there is anything that we can do to loosen the clots or allow them to be clear more easily.  Continue good pulmonary hygiene.   Baltazar Apo, MD, PhD 06/17/2019, 10:08 AM [facility] [phone] or if no answer [phone]

## 2019-06-03 NOTE — Op Note (Signed)
Procedure(s): REMOVAL OF IMPELLA LEFT VENTRICULAR ASSIST DEVICE TRANSESOPHAGEAL ECHOCARDIOGRAM (TEE) Chest Tube Insertion INSERTION OF LEFT VENTRICLE VENT FOR ECMO Procedure Note  Raymond Chavez male 68 y.o. 06/11/2019  Procedure(s) and Anesthesia Type:    * REMOVAL OF IMPELLA LEFT VENTRICULAR ASSIST DEVICE - General    * TRANSESOPHAGEAL ECHOCARDIOGRAM (TEE) - General    * Chest Tube Insertion    * INSERTION OF LEFT VENTRICLE VENT FOR ECMO  Surgeon(s) and Role:    * Wonda Olds, MD - Primary    * Gaye Pollack, MD - Assisting   Indications: The patient  is s/p institution of [facility] ecmo for cardiogenic shock and underlying CAD. He has been supported with LV venting by Impella 5.0 LVAD, but over the past 48 hours has had increased LDH of concern for hemolysis. The treatment team has determined best course of action is to remove the Impella and place surgical LV vent. Consent is obtained from the patient's daughter     Surgeon: Wonda Olds   Assistants: Raymond Meeker, MD  Anesthesia: General endotracheal anesthesia  ASA Class: 5    Procedure Detail  REMOVAL OF IMPELLA LEFT VENTRICULAR ASSIST DEVICE, TRANSESOPHAGEAL ECHOCARDIOGRAM (TEE), Chest Tube Insertion, INSERTION OF LEFT VENTRICLE VENT FOR ECMO He was brought to the OR urgently on 06/21/2019 and placed in the supine position. Anesthesia was confirmed by GET technique. The anterior chest, abdomen and groins were cleansed and draped sterilely. Preoperative surgical pause was performed. The prior upper sternotomy incision was reopened. The right axillary artery incision was reopened. Clot was evacuated from both surgical sites. Irrigation was undertaken. A 24 Fr malleable venous cannula was inserted from the right superior pulmonary vein across the mitral valve and into the LV as directed by TEE. The Impella was then turned off and withdrawn across the aortic valve and out of the axillary artery graft which had been sewn as a  conduit at the time of placement. The LV vent was "Y-ed" into venous limb of the ECMO circuit with a brief period of support interruption. The incisions were then closed in layers. All sponge, instrument, and needle counts were correct.   Findings: Decompressed LV; good hemodynamics  Estimated Blood Loss:  less than 100 mL         Drains: mediastinal fluted tube 28 Fr         Total IV Fluids: per anes  Blood Given: 250 PLTS          Specimens: none         Implants: none        Complications:  * No complications entered in OR log *         Disposition: ICU - intubated and critically ill.         Condition: stable  [name] Z. [name], [place]

## 2019-06-03 NOTE — H&P (Signed)
History and Physical Interval Note:  06/24/2019 12:53 PM  Raymond Chavez  has presented today for surgery, with the diagnosis of Ischemic Cardiomyopathy.  The various methods of treatment have been discussed with the patient and family. After consideration of risks, benefits and other options for treatment, the patient has consented to  Procedure(s): REMOVAL OF IMPELLA LEFT VENTRICULAR ASSIST DEVICE (N/A) TRANSESOPHAGEAL ECHOCARDIOGRAM (TEE) (N/A) as a surgical intervention.  The patient's history has been reviewed, patient examined, no change in status, stable for surgery.  I have reviewed the patient's chart and labs.  Questions were answered to the patient's satisfaction.     Wonda Olds

## 2019-06-03 NOTE — Progress Notes (Signed)
Patient ID: Raymond Chavez, male   DOB: Dec 12, 1950, 68 y.o.   MRN: [id number]    Advanced Heart Failure Rounding Note   Subjective:    Events - Cath 10/23 with sever 3v CAD and cardiogenic shock. Impella CP placed - Underwent placement of Impella 5.0 on 10/24 with removal of R femoral Impella CP.  - Extubated 11/1, CVVH held on 11/3 given  - He had a possible aspiration event afternoon 11/3 then became progressively hypoxemic during the night.  He had to be re-intubated.  Bronchoscopy showed copious blood return suggesting hemoptysis. He developed PEA arrest during this time and underwent CPR  -11/4 initiate ECMO.  Patient went to OR, arterial cannula in ascending aorta and venous cannula in right femoral vein. Impella remains in place at P2.  - 11/7 ECMO circuit change for high dP  Remains intubated/sedated on ECMO, CVVHD, and Impella 5.0  Currently on NE 4, MAP 70s. PLTs remain low at 34k. Hgb 7.8. CVP 7 this am. Pulling - 150-250 on CVVHD. Weight down 6 pounds. ABGs look good. CXR still with diffuse bilateral interstitial infiltrates. LDH (738 -> 946 -> 1218) and fibrinogen (540->600->717) trending up. MV sat 67%. Minimal bleeding from chest tube.   Developed AF. Now on IV amio in NSR.   Impella at P-2. Motor current stable. Flow sensor non-functional. Impella position assessed under echo and pulled back slightly, left at 3.4 cm. Moderate AI noted.   CVVHD pulling - 150-200  ECMO parameters Flow 3.7L 3400RPM. dP 16.    Enterococcus faecalis on BAL culture.  Patient is now on meropenem   Objective:   Weight Range:  Vital Signs:   Temp:  [97.9 F (36.6 C)-99 F (37.2 C)] 97.9 F (36.6 C) (11/09 0742) Pulse Rate:  [61-106] 106 (11/09 0430) Resp:  [10-20] 14 (11/09 0700) BP: (91-99)/(68-74) 92/71 (11/09 0430) SpO2:  [99 %-100 %] 100 % (11/09 0700) Arterial Line BP: (82-106)/(54-81) 94/61 (11/09 0700) FiO2 (%):  [30 %] 30 % (11/09 0430) Weight:  [72.1 kg] 72.1 kg (11/09 0430)  Last BM Date: 06/02/19(Flexiseal)  Weight change: Filed Weights   06/01/19 0615 06/02/19 0414 06/02/2019 0430  Weight: 76.3 kg 74.5 kg 72.1 kg    Intake/Output:   Intake/Output Summary (Last 24 hours) at 06/17/2019 0748 Last data filed at 06/24/2019 0700 Gross per 24 hour  Intake 4598.64 ml  Output 8777 ml  Net -4178.36 ml     Physical Exam: General: Intubated/sedated Neck: JVP not elevated, no thyromegaly or thyroid nodule.  Lungs: Coarse BS bilaterally.  CV: Nondisplaced PMI.  Heart regular S1/S2, no S3/S4, no murmur.  1+ edema to knees.  Abdomen: Soft, nontender, no hepatosplenomegaly, no distention.  Skin: Intact without lesions or rashes.  Neurologic: Intubated/sedated.  Extremities: No clubbing or cyanosis.  HEENT: Normal.    Telemetry: NSR 70s (personally reviewed)  Labs: Basic Metabolic Panel: Recent Labs  Lab 05/30/19 0903  05/30/19 1626  05/31/19 0308  05/31/19 1618  06/01/19 0316  06/01/19 1555  06/02/19 0303  06/02/19 1150 06/02/19 1632  06/14/2019 0010 06/16/2019 0110 06/21/2019 0235 06/14/2019 0407 06/22/2019 0615  NA  --    < > 138   < > 137   < > 136   < > 137   < > 140   < > 138   < > 137 136   < > 137 136 137 137 137  K  --    < > 4.7   < > 4.7   < >  4.6   < > 4.6   < > 4.8   < > 4.7   < > 4.6 4.7   < > 4.8 4.7 4.6 4.7 4.7  CL  --    < > 106  --  107  --  103   < > 105  --  107  --  105  --  104 104  --   --   --   --  104  --   CO2  --    < > 25  --  24  --  23   < > 24  --  24  --  25  --  24 25  --   --   --   --  23  --   GLUCOSE  --    < > 152*  --  184*  --  172*   < > 210*  --  176*  --  208*  --  199* 164*  --   --   --   --  178*  --   BUN  --    < > 37*  --  33*  --  31*   < > 32*  --  30*  --  31*  --  34* 35*  --   --   --   --  38*  --   CREATININE  --    < > 1.69*  --  1.45*  --  1.43*   < > 1.35*  --  1.26*  --  1.29*  --  1.33* 1.32*  --   --   --   --  1.31*  --   CALCIUM  --    < > 6.9*  --  6.8*  --  7.2*   < > 7.2*  --  7.1*  --  7.3*   --  8.0* 7.7*  --   --   --   --  7.6*  --   MG 2.4  --  2.5*  --  2.3  --  2.4  --  2.6*  --  2.4  --   --   --   --   --   --   --   --   --   --   --   PHOS 5.1*  --  4.5  --  3.6  --  3.4  --   --   --   --   --  3.1  --   --   --   --   --   --   --   --   --    < > = values in this interval not displayed.    Liver Function Tests: Recent Labs  Lab 06/01/19 1555 06/02/19 0303 06/02/19 1150 06/02/19 1632 06/24/2019 0407  AST 43* 39 38 42* 47*  ALT _0 ALKPHOS 62 67 68 71 85  BILITOT 2.1* 2.0* 2.0* 1.8* 1.7*  PROT 4.6* 4.8* 4.7* 4.7* 5.0*  ALBUMIN 2.3* 2.3* 2.1* 2.1* 2.1*   No results for input(s): LIPASE, AMYLASE in the last 168 hours. No results for input(s): AMMONIA in the last 168 hours.  CBC: Recent Labs  Lab 05/28/19 0329  05/26/2019 0211  05/31/2019 2308  05/30/19 0550  06/02/19 0303  06/02/19 1150 06/02/19 1632  06/02/19 2302  06/18/2019 0110 06/14/2019 0235 06/02/2019 0407 06/19/2019 0615 06/15/2019 0646  WBC 29.2*   < >  30.1*   < > 19.3*   < > 15.6*   < > 16.0*  --  14.9* 14.7*  --  16.5*  --   --   --  15.6*  --   --   NEUTROABS 22.8*  --  22.3*  --  15.6*  --  12.9*  --   --   --  13.6*  --   --   --   --   --   --   --   --   --   HGB 8.7*   < > 5.9*   < > 11.0*   < > 9.0*   < > 8.6*   < > 8.1* 7.9*   < > 8.9*   < > 8.2* 7.8* 8.6* 7.8* 7.8*  HCT 26.8*   < > 18.0*   < > 31.7*   < > 26.4*   < > 25.4*   < > 24.1* 23.8*   < > 26.5*   < > 24.0* 23.0* 25.8* 23.0* 23.5*  MCV 90.8   < > 91.4   < > 88.5   < > 90.1   < > 93.0  --  93.1 93.3  --  92.0  --   --   --  92.8  --   --   PLT 93*   < > 54*   < > 46*   < > 97*   < > 36*  --  32* 35*  --  34*  --   --   --  34*  --   --    < > = values in this interval not displayed.    Cardiac Enzymes: No results for input(s): CKTOTAL, CKMB, CKMBINDEX, TROPONINI in the last 168 hours.  BNP: BNP (last 3 results) Recent Labs    05/25/2019 1739 05/23/2019 1234  BNP 2,725.3* 3,370.0*    ProBNP (last 3 results) No results  for input(s): PROBNP in the last 8760 hours.    Other results:  Imaging: Dg Chest 1 View  Result Date: 06/24/2019 CLINICAL DATA:  Endotracheal tube. EXAM: CHEST  1 VIEW COMPARISON:  June 02, 2019. FINDINGS: Stable cardiomediastinal silhouette. Stable endotracheal and nasogastric tubes. Stable bilateral jugular catheters. Stable right-sided PICC line. Stable left ventricular assist device. No pneumothorax is noted. Stable bilateral lung opacities are noted. Bony thorax is unremarkable. IMPRESSION: Stable support apparatus.  Stable bilateral lung opacities. Electronically Signed   By: Marijo Conception M.D.   On: 06/24/2019 07:44   Dg Chest Port 1 View  Result Date: 06/02/2019 CLINICAL DATA:  Patient status post cardiac surgery. EXAM: PORTABLE CHEST 1 VIEW COMPARISON:  Chest radiograph 06/01/2019 FINDINGS: Right upper extremity PICC line tip projects over the superior vena cava. Left IJ approach central venous catheter tip projects over the superior vena cava. ETT terminates in the mid trachea. Enteric tube courses inferior to the diaphragm. Mediastinal drains project over the mediastinum. Impella device stable in position. ECMO cannula projects over the mediastinum. Stable cardiomegaly. Similar diffuse bilateral heterogeneous pulmonary opacities. No pleural effusion or pneumothorax. IMPRESSION: Support apparatus as above. Similar diffuse bilateral airspace opacities which may represent edema. Electronically Signed   By: Lovey Newcomer M.D.   On: 06/02/2019 10:08     Medications:     Scheduled Medications: . acetaminophen  1,000 mg Oral Q6H   Or  . acetaminophen (TYLENOL) oral liquid 160 mg/5 mL  1,000 mg Per Tube Q6H  . acetaminophen (TYLENOL) oral liquid 160  mg/5 mL  650 mg Per Tube Once   Or  . acetaminophen  650 mg Rectal Once  . arformoterol  15 mcg Nebulization BID  . aspirin  81 mg Per Tube Daily  . atorvastatin  80 mg Per Tube q1800  . B-complex with vitamin C  1 tablet Per Tube  Daily  . chlorhexidine gluconate (MEDLINE KIT)  15 mL Mouth Rinse BID  . Chlorhexidine Gluconate Cloth  6 each Topical Daily  . feeding supplement (PRO-STAT SUGAR FREE 64)  30 mL Per Tube QID  . guaiFENesin  30 mL Per Tube BID  . insulin aspart  1-3 Units Subcutaneous Q4H  . mouth rinse  15 mL Mouth Rinse 10 times per day  . mupirocin ointment   Nasal BID  . pantoprazole  40 mg Intravenous Q12H  . sodium chloride flush  10-40 mL Intracatheter Q12H    Infusions: .  prismasol BGK 4/2.5 400 mL/hr at 05/28/2019 0125  .  prismasol BGK 4/2.5 300 mL/hr at 06/02/19 1659  . sodium chloride 10 mL/hr at 06/14/2019 0700  . sodium chloride Stopped (05/28/19 0127)  . amiodarone 30 mg/hr (06/15/2019 0700)  . cisatracurium (NIMBEX) infusion Stopped (05/30/19 0844)  . feeding supplement (VITAL 1.5 CAL) 50 mL/hr at 06/02/19 1900  . fentaNYL infusion INTRAVENOUS 250 mcg/hr (06/12/2019 0700)  . impella catheter heparin 50 unit/mL in dextrose 5% 50,000 Units (05/23/19 0815)  . heparin 600 Units/hr (05/31/2019 0700)  . meropenem (MERREM) IV 1 g (05/31/2019 0147)  . midazolam 6 mg/hr (06/24/2019 0700)  . norepinephrine (LEVOPHED) Adult infusion 4 mcg/min (06/21/2019 0700)  . prismasol BGK 4/2.5 1,800 mL/hr at 06/22/2019 0520  . vancomycin 750 mg (06/02/19 0922)    PRN Medications: sodium chloride, Place/Maintain arterial line **AND** sodium chloride, docusate, heparin, influenza vaccine adjuvanted, midazolam, morphine injection, ondansetron (ZOFRAN) IV, pneumococcal 23 valent vaccine, sodium chloride, sodium chloride flush   Assessment/Plan:   1. Shock: Possible mixed cardiogenic/septic with fever and suspected PNA.  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  Echo with EF 20%, RV moderately HK.  Impella CP placed 10/23. Switched for Impella 5.0 on 10/24. No flow meter on Impella due to damage to sensor on insertion. Patient has LBBB.  PEA arrest 11/3, ECMO started 11/4. ECMO circuit changed  due to high dP and rising LDH on 11/7.  Now at 3400 RPM with 3.7L flow. Impella is at P2.  MAP 70s on NE 4, co-ox 67%. CVVH net UF average 200 cc/hr, weight down another 6 lbs with CVP 6.  CXR still with diffuse bilateral interstitial infiltrates, no improvement.  - Decrease CVVH to net 50 cc/hr with CVP 6.  - Possible that CXR picture is due to undecompressed LV.  Impella position adjusted and increased to P7 to attempt to decompress ventricle.  Moderate aortic insufficiency noted. Rising LDH concerning, suspect not ECMO given circuit change on Saturday, ?improve with position change but now we are also turning it up.  Will discuss with team.  - Continue ECMO at current flow.  - Placed TED hose to help with LE edema.   - Heparin ongoing. No obvious bleeding. - Continue to replace blood products as needed - D/w ECMO coordinator and ECMO specialist  2. CAD:  NSTEMI, hs-TnI 16,000. 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  - continue ASA/statin. No b-blocker with shock - Goal is eventual CABG at some point,  discussed with Dr. [name] 3. AKI: Baseline creatinine 1.3, now AKI likely due to ATN from shock and contrast (had CTA for PE at admission, only 25 cc contrast with cath).  CVVH ongoing.  - Continue CVVH. Management as above 4. Acute Respiratory Failure: Has had PNA with Enterococcus faecalis in BAL cultures. Now re-intubated.  He had PEA arrest with possible aspiration 11/3 and also had pulmonary hemorrhage.  Hemoptysis appears to have stopped. CXR still with diffuse infiltrates. ? Fluid (failure to decompress LV) vs ARDS vs infection - CVVH management as above. - Increase Impella flow to decompress ventricle.  - Continue meropenem per CCM.  - ??Bronchoscopy, d/w CCM.  5. ID: Suspected PNA, CXR with diffuse infiltrates. Enterococcus faecalis in BAL cultures. Afebrile and WBCs stable at 15.  - As above, with possible aspiration/pulmonary hemorrhage/PEA arrest,  started vancomycin/meropenem, now on meropenem.   Continue - Recheck sputum cx - ??bronchoscopy.  6. LBBB: Unsure chronicity.  7. Anemia: Hgb 7.8 today, replacing blood products 8. Thrombocytopenia: HIT negative  ?Low due to sepsis versus hemolysis. Continue to replace as needed 9. PVCs/NSVT: Quiescent.  10. FEN: Cortrak,getting post-pyloric TFs.  11. PAF with post-termination pauses: NSR today.  - Continue amio and coumadin  CRITICAL CARE Performed by: Loralie Champagne  Total critical care time: 45 minutes  Critical care time was exclusive of separately billable procedures and treating other patients.  Critical care was necessary to treat or prevent imminent or life-threatening deterioration.  Critical care was time spent personally by me on the following activities: development of treatment plan with patient and/or surrogate as well as nursing, discussions with consultants, evaluation of patient's response to treatment, examination of patient, obtaining history from patient or surrogate, ordering and performing treatments and interventions, ordering and review of laboratory studies, ordering and review of radiographic studies, pulse oximetry and re-evaluation of patient's condition.  Loralie Champagne 05/31/2019 7:48 AM

## 2019-06-03 NOTE — Progress Notes (Signed)
During bath patient began to drop his flows (1.5) with his Pven becoming more negative (down to -130). CRRT stopped pulling- keeping even. CVP 3 and MAP 60. Levo titrated for MAPs. RPM titrated down to stop chugging and large variations in Pven. Unable to return RPM to prior speed due to chugging. Albumin given per previous conversation with Dr. [name].

## 2019-06-03 NOTE — Progress Notes (Signed)
PT Cancellation Note  Patient Details Name: Raymond Chavez MRN: [id number] DOB: [birth year]   Cancelled Treatment:    Reason Eval/Treat Not Completed: Medical issues which prohibited therapy Currently getting an urgent bronchoscopy to remove clots. Will follow up as appropriate.   Marguarite Arbour A Danialle Dement 06/09/2019, 11:30 AM Marisa Severin, PT, DPT Acute Rehabilitation Services Pager [phone] Office [phone]

## 2019-06-03 NOTE — Brief Op Note (Signed)
06/21/2019  5:04 PM  PATIENT:  Raymond Chavez  67 y.o. male  PRE-OPERATIVE DIAGNOSIS:  Ischemic Cardiomyopathy  POST-OPERATIVE DIAGNOSIS:  Ischemic Cardiomyopathy  PROCEDURE:  Procedure(s): REMOVAL OF IMPELLA LEFT VENTRICULAR ASSIST DEVICE (N/A) TRANSESOPHAGEAL ECHOCARDIOGRAM (TEE) (N/A) Chest Tube Insertion INSERTION OF LEFT VENTRICLE VENT FOR ECMO  SURGEON:  Surgeon(s) and Role:    * Wonda Olds, MD - Primary    * Bartle, Fernande Boyden, MD - Assisting  PHYSICIAN ASSISTANT:   ASSISTANTS: staff   ANESTHESIA:   general  EBL:  100  BLOOD ADMINISTERED:250 PLTS  DRAINS: 1 Chest Tube(s) in the mediastinum   LOCAL MEDICATIONS USED:  NONE  SPECIMEN:  No Specimen  DISPOSITION OF SPECIMEN:  N/A  COUNTS:  YES  TOURNIQUET:  * No tourniquets in log *  DICTATION: .Note written in EPIC  PLAN OF CARE: Admit to inpatient   PATIENT DISPOSITION:  ICU - intubated and critically ill.   Delay start of Pharmacological VTE agent (>24hrs) due to surgical blood loss or risk of bleeding: yes  Jameil Whitmoyer Z. [name], [place]

## 2019-06-03 NOTE — Progress Notes (Addendum)
Decreased ECMO flows noted with pt position change, CVP 2-4, chugging noted briefly on ECMO lines, RPMs decreased to release suction but only able to increase back to 3200 with flows of 3.3lpm, bedside RN giving 250 Alb to patient, will continue to monitor

## 2019-06-03 NOTE — Anesthesia Preprocedure Evaluation (Addendum)
Anesthesia Evaluation  Patient identified by MRN, date of birth, ID band Patient awake    Reviewed: Allergy & Precautions, NPO status , Patient's Chart, lab work & pertinent test results  Airway Mallampati: Intubated      Comment: Intubated  Dental   Pulmonary Current Smoker,       + intubated    Cardiovascular hypertension, + CAD, + Past MI and +CHF   Rhythm:Regular Rate:Normal + Systolic murmurs History noted CG   Neuro/Psych    GI/Hepatic   Endo/Other    Renal/GU Renal disease     Musculoskeletal   Abdominal   Peds  Hematology   Anesthesia Other Findings   Reproductive/Obstetrics                            Anesthesia Physical Anesthesia Plan  ASA: IV  Anesthesia Plan: General   Post-op Pain Management:    Induction: Intravenous  PONV Risk Score and Plan: 2 and Propofol infusion and Midazolam  Airway Management Planned: Oral ETT  Additional Equipment: Arterial line, PA Cath and TEE  Intra-op Plan:   Post-operative Plan: Post-operative intubation/ventilation  Informed Consent: I have reviewed the patients History and Physical, chart, labs and discussed the procedure including the risks, benefits and alternatives for the proposed anesthesia with the patient or authorized representative who has indicated his/her understanding and acceptance.       Plan Discussed with: CRNA, Anesthesiologist and Surgeon  Anesthesia Plan Comments:         Anesthesia Quick Evaluation

## 2019-06-03 NOTE — Progress Notes (Addendum)
Pharmacy Anti-Coagulation Consult Note:  Pharmacy Consult for Heparin Indication for heparin: Impella 5.0 & ECMO  No Known Allergies  Patient Measurements: Height: 5\' 7"  (170.2 cm)(measured x3) Weight: 158 lb 15.2 oz (72.1 kg) IBW/kg (Calculated) : 66.1  Vital Signs: Temp: 97.3 F (36.3 C) (11/09 1629) Temp Source: Core (11/09 1629) Pulse Rate: 51 (11/09 1629)  Labs: Recent Labs    06/01/19 0316  06/02/19 1150 06/02/19 1632  06/02/19 2302  06/16/2019 0407  06/17/2019 0646 06/19/2019 1054 05/30/2019 1421 06/19/2019 1653  HGB 7.6*   < > 8.1* 7.9*   < > 8.9*   < > 8.6*   < > 7.8*  --  7.5* 7.6*  HCT 22.3*   < > 24.1* 23.8*   < > 26.5*   < > 25.8*   < > 23.5*  --  22.0* 22.5*  PLT 22*   < > 32* 35*  --  34*  --  34*  --   --   --   --  39*  APTT 58*   < >  --  67*  --  61*  --  60*  --   --  67*  --  43*  LABPROT 21.1*   < >  --  17.6*  --   --   --  16.5*  --   --   --   --  18.0*  INR 1.9*   < >  --  1.5*  --   --   --  1.4*  --   --   --   --  1.5*  HEPARINUNFRC <0.10*  --   --   --   --  0.16*  --   --   --   --  0.19*  --   --   CREATININE 1.35*   < > 1.33* 1.32*  --   --   --  1.31*  --   --   --   --   --    < > = values in this interval not displayed.    Estimated Creatinine Clearance: 49.8 mL/min (A) (by C-G formula based on SCr of 1.31 mg/dL (H)).  Assessment: 69 yo M admitted with shock and concern for ACS now s/p Impella CP placement in cath lab. Impella CP swapped out for 5.0 d/t hemolysis. Underwent [facility] ECMO with goal of CABG for revascularization.   ECMO initiate about 1800 11/4 - originally started on heparin at 1500 units/hr systemically, which was held for bleeding. Peripheral Heparin was restarted 11/6 about noon 100 units/hr now titrated up 600 units/hr for thrombosis prevention (total ~1300-1400 units/hr including heparinized purge solution).  Impella pulled in OR 11/9, systemic heparin resumed at 1200 units/hr then held with significant chest tube output shortly  thereafter. This was controlled with DDAVP x1 and heparin resumed at prior rate per Dr. [name] with request for repeat aPTT in 2h.   Goal of Therapy:  APTT 60 - 70 sec Monitor platelets by anticoagulation protocol: Yes   Plan:  Restart heparin at 1200 units/hr Recheck aPTT in 2hr   ADDENDUM: Repeat aPTT 57 seconds, slightly below goal but heparin only recently resumed ~2h ago. Per [name] - keep current heparin rate above, recheck aPTT with morning labs.    Arrie Senate, PharmD, BCPS Clinical Pharmacist [phone] Please check AMION for all [place] numbers 06/24/2019

## 2019-06-03 NOTE — Progress Notes (Signed)
Dr. [name] notified of lab results, frank blood in flexiseal, and occasional low flows of 0.9 on LV vent. All blood products have been transfused, hgb 7.8, hct 24 on cardiohelp. CVP 4 and MAP 65. Discussed with MD, labs will be checked in AM unless bleeding occurs. Restart levo and pull 30-50/hr on CRRT if able.

## 2019-06-03 NOTE — Progress Notes (Signed)
Pharmacy Anti-Coagulation Consult Note:  Pharmacy Consult for Heparin Indication for heparin: Impella 5.0 & ECMO  No Known Allergies  Patient Measurements: Height: 5\' 7"  (170.2 cm)(measured x3) Weight: 158 lb 15.2 oz (72.1 kg) IBW/kg (Calculated) : 66.1  Vital Signs: Temp: 97.3 F (36.3 C) (11/09 1629) Temp Source: Core (11/09 1629) Pulse Rate: 51 (11/09 1629)  Labs: Recent Labs    06/01/19 0316  06/02/19 0303  06/02/19 1150 06/02/19 1632  06/02/19 2302  06/15/2019 0407 06/14/2019 0615 06/07/2019 0646 06/22/2019 1054 06/18/2019 1421  HGB 7.6*   < > 8.6*   < > 8.1* 7.9*   < > 8.9*   < > 8.6* 7.8* 7.8*  --  7.5*  HCT 22.3*   < > 25.4*   < > 24.1* 23.8*   < > 26.5*   < > 25.8* 23.0* 23.5*  --  22.0*  PLT 22*   < > 36*  --  32* 35*  --  34*  --  34*  --   --   --   --   APTT 58*   < > 51*   < >  --  67*  --  61*  --  60*  --   --  67*  --   LABPROT 21.1*   < > 18.3*  --   --  17.6*  --   --   --  16.5*  --   --   --   --   INR 1.9*   < > 1.5*  --   --  1.5*  --   --   --  1.4*  --   --   --   --   HEPARINUNFRC <0.10*  --   --   --   --   --   --  0.16*  --   --   --   --  0.19*  --   CREATININE 1.35*   < > 1.29*  --  1.33* 1.32*  --   --   --  1.31*  --   --   --   --    < > = values in this interval not displayed.    Estimated Creatinine Clearance: 49.8 mL/min (A) (by C-G formula based on SCr of 1.31 mg/dL (H)).  Assessment: 68 yo M admitted with shock and concern for ACS now s/p Impella CP placement in cath lab. Impella CP swapped out for 5.0 d/t hemolysis. Underwent [facility] ECMO with goal of CABG for revascularization.   ECMO initiate about 1800 11/4 - originally started on heparin at 1500 units/hr systemically, which was held for bleeding.   Peripheral Heparin was restarted 11/6 about noon 100 units/hr now titrated up 600 units/hr for thrombosis prevention.   Currently impella purge flow is  25mL/hr (850 units/hr)  Total heparin drip 1450uts/hr with aptt 67 sec - within goal  heparin level 0.19  - somewhat correlating Fibrinogen trending up 700, LDH up 1200   No changes in CT drainage or amount being suctioned through ET tube ECMO circuit changed 11/7 for possible clot - no complications   Hgb trending down to 7.9 (from 8.1).  Transfuse 1 PRBC ordered.  Goal of Therapy:  APTT 60 - 70 sec Monitor platelets by anticoagulation protocol: Yes   Plan:  Discussion regarding removing impella  Follow up for [facility] changes after  Currently  Continue heparin purge Continue heparin peripherally at 600 units/hr per Dr. [facility] Re-check aPTT at [address]  [facility]  Pablo Lawrence.D. CPP, BCPS Clinical Pharmacist [phone] 06/24/2019 4:39 PM     **Pharmacist phone directory can now be found on [email or web address] ([email or web address]).  Listed under [place].  06/14/2019 4:36 PM

## 2019-06-04 ENCOUNTER — Inpatient Hospital Stay (HOSPITAL_COMMUNITY): Payer: PPO

## 2019-06-04 ENCOUNTER — Encounter (HOSPITAL_COMMUNITY): Payer: Self-pay | Admitting: Cardiothoracic Surgery

## 2019-06-04 DIAGNOSIS — I2510 Atherosclerotic heart disease of native coronary artery without angina pectoris: Secondary | ICD-10-CM

## 2019-06-04 DIAGNOSIS — J9601 Acute respiratory failure with hypoxia: Secondary | ICD-10-CM

## 2019-06-04 DIAGNOSIS — R570 Cardiogenic shock: Secondary | ICD-10-CM

## 2019-06-04 LAB — POCT I-STAT 7, (LYTES, BLD GAS, ICA,H+H)
Acid-Base Excess: 1 mmol/L (ref 0.0–2.0)
Acid-Base Excess: 3 mmol/L — ABNORMAL HIGH (ref 0.0–2.0)
Acid-Base Excess: 3 mmol/L — ABNORMAL HIGH (ref 0.0–2.0)
Acid-Base Excess: 6 mmol/L — ABNORMAL HIGH (ref 0.0–2.0)
Bicarbonate: 26.9 mmol/L (ref 20.0–28.0)
Bicarbonate: 27.8 mmol/L (ref 20.0–28.0)
Bicarbonate: 28 mmol/L (ref 20.0–28.0)
Bicarbonate: 30 mmol/L — ABNORMAL HIGH (ref 20.0–28.0)
Calcium, Ion: 0.96 mmol/L — ABNORMAL LOW (ref 1.15–1.40)
Calcium, Ion: 1.01 mmol/L — ABNORMAL LOW (ref 1.15–1.40)
Calcium, Ion: 1.02 mmol/L — ABNORMAL LOW (ref 1.15–1.40)
Calcium, Ion: 1.07 mmol/L — ABNORMAL LOW (ref 1.15–1.40)
HCT: 23 % — ABNORMAL LOW (ref 39.0–52.0)
HCT: 23 % — ABNORMAL LOW (ref 39.0–52.0)
HCT: 26 % — ABNORMAL LOW (ref 39.0–52.0)
HCT: 26 % — ABNORMAL LOW (ref 39.0–52.0)
Hemoglobin: 7.8 g/dL — ABNORMAL LOW (ref 13.0–17.0)
Hemoglobin: 7.8 g/dL — ABNORMAL LOW (ref 13.0–17.0)
Hemoglobin: 8.8 g/dL — ABNORMAL LOW (ref 13.0–17.0)
Hemoglobin: 8.8 g/dL — ABNORMAL LOW (ref 13.0–17.0)
O2 Saturation: 100 %
O2 Saturation: 100 %
O2 Saturation: 100 %
O2 Saturation: 100 %
Patient temperature: 37.1
Patient temperature: 37.3
Patient temperature: 97.9
Patient temperature: 99.1
Potassium: 5 mmol/L (ref 3.5–5.1)
Potassium: 5 mmol/L (ref 3.5–5.1)
Potassium: 5 mmol/L (ref 3.5–5.1)
Potassium: 5 mmol/L (ref 3.5–5.1)
Sodium: 137 mmol/L (ref 135–145)
Sodium: 138 mmol/L (ref 135–145)
Sodium: 138 mmol/L (ref 135–145)
Sodium: 139 mmol/L (ref 135–145)
TCO2: 28 mmol/L (ref 22–32)
TCO2: 29 mmol/L (ref 22–32)
TCO2: 29 mmol/L (ref 22–32)
TCO2: 31 mmol/L (ref 22–32)
pCO2 arterial: 42 mmHg (ref 32.0–48.0)
pCO2 arterial: 42.2 mmHg (ref 32.0–48.0)
pCO2 arterial: 44.1 mmHg (ref 32.0–48.0)
pCO2 arterial: 45.1 mmHg (ref 32.0–48.0)
pH, Arterial: 7.382 (ref 7.350–7.450)
pH, Arterial: 7.412 (ref 7.350–7.450)
pH, Arterial: 7.429 (ref 7.350–7.450)
pH, Arterial: 7.46 — ABNORMAL HIGH (ref 7.350–7.450)
pO2, Arterial: 505 mmHg — ABNORMAL HIGH (ref 83.0–108.0)
pO2, Arterial: 528 mmHg — ABNORMAL HIGH (ref 83.0–108.0)
pO2, Arterial: 536 mmHg — ABNORMAL HIGH (ref 83.0–108.0)
pO2, Arterial: 551 mmHg — ABNORMAL HIGH (ref 83.0–108.0)

## 2019-06-04 LAB — CBC
HCT: 20.3 % — ABNORMAL LOW (ref 39.0–52.0)
HCT: 24.5 % — ABNORMAL LOW (ref 39.0–52.0)
HCT: 25.5 % — ABNORMAL LOW (ref 39.0–52.0)
HCT: 21.4 % — ABNORMAL LOW (ref 39.0–52.0)
Hemoglobin: 6.9 g/dL — CL (ref 13.0–17.0)
Hemoglobin: 8.3 g/dL — ABNORMAL LOW (ref 13.0–17.0)
Hemoglobin: 8.5 g/dL — ABNORMAL LOW (ref 13.0–17.0)
Hemoglobin: 7.1 g/dL — ABNORMAL LOW (ref 13.0–17.0)
MCH: 30.7 pg (ref 26.0–34.0)
MCH: 28.9 pg (ref 26.0–34.0)
MCH: 29 pg (ref 26.0–34.0)
MCH: 29.1 pg (ref 26.0–34.0)
MCHC: 34 g/dL (ref 30.0–36.0)
MCHC: 33.9 g/dL (ref 30.0–36.0)
MCHC: 33.3 g/dL (ref 30.0–36.0)
MCHC: 33.2 g/dL (ref 30.0–36.0)
MCV: 90.2 fL (ref 80.0–100.0)
MCV: 85.4 fL (ref 80.0–100.0)
MCV: 87 fL (ref 80.0–100.0)
MCV: 87.7 fL (ref 80.0–100.0)
Platelets: 41 10*3/uL — ABNORMAL LOW (ref 150–400)
Platelets: 82 10*3/uL — ABNORMAL LOW (ref 150–400)
Platelets: 55 10*3/uL — ABNORMAL LOW (ref 150–400)
Platelets: 43 10*3/uL — ABNORMAL LOW (ref 150–400)
RBC: 2.25 MIL/uL — ABNORMAL LOW (ref 4.22–5.81)
RBC: 2.87 MIL/uL — ABNORMAL LOW (ref 4.22–5.81)
RBC: 2.93 MIL/uL — ABNORMAL LOW (ref 4.22–5.81)
RBC: 2.44 MIL/uL — ABNORMAL LOW (ref 4.22–5.81)
RDW: 16.3 % — ABNORMAL HIGH (ref 11.5–15.5)
RDW: 17 % — ABNORMAL HIGH (ref 11.5–15.5)
RDW: 16.7 % — ABNORMAL HIGH (ref 11.5–15.5)
RDW: 17 % — ABNORMAL HIGH (ref 11.5–15.5)
WBC: 9.3 10*3/uL (ref 4.0–10.5)
WBC: 9.8 10*3/uL (ref 4.0–10.5)
WBC: 8.4 10*3/uL (ref 4.0–10.5)
WBC: 5.8 10*3/uL (ref 4.0–10.5)
nRBC: 0.3 % — ABNORMAL HIGH (ref 0.0–0.2)
nRBC: 0.5 % — ABNORMAL HIGH (ref 0.0–0.2)
nRBC: 0.4 % — ABNORMAL HIGH (ref 0.0–0.2)
nRBC: 0.9 % — ABNORMAL HIGH (ref 0.0–0.2)

## 2019-06-04 LAB — PROTIME-INR
INR: 1.4 — ABNORMAL HIGH (ref 0.8–1.2)
INR: 1.4 — ABNORMAL HIGH (ref 0.8–1.2)
Prothrombin Time: 16.9 seconds — ABNORMAL HIGH (ref 11.4–15.2)
Prothrombin Time: 17 seconds — ABNORMAL HIGH (ref 11.4–15.2)

## 2019-06-04 LAB — LACTIC ACID, PLASMA
Lactic Acid, Venous: 1.2 mmol/L (ref 0.5–1.9)
Lactic Acid, Venous: 1.5 mmol/L (ref 0.5–1.9)

## 2019-06-04 LAB — BPAM PLATELET PHERESIS
Blood Product Expiration Date: 202011092359
Blood Product Expiration Date: 202011112359
ISSUE DATE / TIME: 202011091623
ISSUE DATE / TIME: 202011091623
Unit Type and Rh: 600
Unit Type and Rh: 7300

## 2019-06-04 LAB — COMPREHENSIVE METABOLIC PANEL
ALT: 20 U/L (ref 0–44)
ALT: 19 U/L (ref 0–44)
AST: 35 U/L (ref 15–41)
AST: 35 U/L (ref 15–41)
Albumin: 2.2 g/dL — ABNORMAL LOW (ref 3.5–5.0)
Albumin: 2.4 g/dL — ABNORMAL LOW (ref 3.5–5.0)
Alkaline Phosphatase: 65 U/L (ref 38–126)
Alkaline Phosphatase: 75 U/L (ref 38–126)
Anion gap: 9 (ref 5–15)
Anion gap: 8 (ref 5–15)
BUN: 45 mg/dL — ABNORMAL HIGH (ref 8–23)
BUN: 46 mg/dL — ABNORMAL HIGH (ref 8–23)
CO2: 23 mmol/L (ref 22–32)
CO2: 22 mmol/L (ref 22–32)
Calcium: 7.1 mg/dL — ABNORMAL LOW (ref 8.9–10.3)
Calcium: 7.1 mg/dL — ABNORMAL LOW (ref 8.9–10.3)
Chloride: 104 mmol/L (ref 98–111)
Chloride: 107 mmol/L (ref 98–111)
Creatinine, Ser: 1.39 mg/dL — ABNORMAL HIGH (ref 0.61–1.24)
Creatinine, Ser: 1.37 mg/dL — ABNORMAL HIGH (ref 0.61–1.24)
GFR calc Af Amer: 60 mL/min — ABNORMAL LOW (ref >60)
GFR calc Af Amer: >60 mL/min (ref >60)
GFR calc non Af Amer: 51 mL/min — ABNORMAL LOW (ref >60)
GFR calc non Af Amer: 52 mL/min — ABNORMAL LOW (ref >60)
Glucose, Bld: 150 mg/dL — ABNORMAL HIGH (ref 70–99)
Glucose, Bld: 167 mg/dL — ABNORMAL HIGH (ref 70–99)
Potassium: 5 mmol/L (ref 3.5–5.1)
Potassium: 5 mmol/L (ref 3.5–5.1)
Sodium: 136 mmol/L (ref 135–145)
Sodium: 137 mmol/L (ref 135–145)
Total Bilirubin: 1.8 mg/dL — ABNORMAL HIGH (ref 0.3–1.2)
Total Bilirubin: 1.8 mg/dL — ABNORMAL HIGH (ref 0.3–1.2)
Total Protein: 4.3 g/dL — ABNORMAL LOW (ref 6.5–8.1)
Total Protein: 4.4 g/dL — ABNORMAL LOW (ref 6.5–8.1)

## 2019-06-04 LAB — GLUCOSE, CAPILLARY
Glucose-Capillary: 142 mg/dL — ABNORMAL HIGH (ref 70–99)
Glucose-Capillary: 158 mg/dL — ABNORMAL HIGH (ref 70–99)
Glucose-Capillary: 118 mg/dL — ABNORMAL HIGH (ref 70–99)
Glucose-Capillary: 139 mg/dL — ABNORMAL HIGH (ref 70–99)

## 2019-06-04 LAB — PREPARE RBC (CROSSMATCH)

## 2019-06-04 LAB — APTT
aPTT: 65 seconds — ABNORMAL HIGH (ref 24–36)
aPTT: 48 seconds — ABNORMAL HIGH (ref 24–36)
aPTT: 44 seconds — ABNORMAL HIGH (ref 24–36)

## 2019-06-04 LAB — CULTURE, RESPIRATORY W GRAM STAIN: Gram Stain: NONE SEEN

## 2019-06-04 LAB — PREPARE PLATELET PHERESIS
Unit division: 0
Unit division: 0

## 2019-06-04 LAB — FIBRINOGEN
Fibrinogen: 452 mg/dL (ref 210–475)
Fibrinogen: 374 mg/dL (ref 210–475)

## 2019-06-04 LAB — LACTATE DEHYDROGENASE: LDH: 985 U/L — ABNORMAL HIGH (ref 98–192)

## 2019-06-04 LAB — PHOSPHORUS: Phosphorus: 4.3 mg/dL (ref 2.5–4.6)

## 2019-06-04 LAB — HEPARIN LEVEL (UNFRACTIONATED)
Heparin Unfractionated: 0.17 IU/mL — ABNORMAL LOW (ref 0.30–0.70)
Heparin Unfractionated: <0.1 IU/mL — ABNORMAL LOW (ref 0.30–0.70)

## 2019-06-04 MED ORDER — TRANEXAMIC ACID 1000 MG/10ML IV SOLN
1.5000 mg/kg/h | INTRAVENOUS | Status: AC
  Administered 2019-06-05: 1.5 mg/kg/h via INTRAVENOUS
  Filled 2019-06-04: qty 25

## 2019-06-04 MED ORDER — VANCOMYCIN HCL 1000 MG IV SOLR
INTRAVENOUS | Status: DC
  Filled 2019-06-04: qty 1000

## 2019-06-04 MED ORDER — SODIUM CHLORIDE 0.9 % IV SOLN
1.5000 g | INTRAVENOUS | Status: AC
  Administered 2019-06-05: 1.5 g via INTRAVENOUS
  Filled 2019-06-04: qty 1.5

## 2019-06-04 MED ORDER — FLUCONAZOLE IN SODIUM CHLORIDE 400-0.9 MG/200ML-% IV SOLN
400.0000 mg | INTRAVENOUS | Status: DC
  Administered 2019-06-07 – 2019-06-08 (×2): 400 mg via INTRAVENOUS
  Filled 2019-06-04 (×4): qty 200

## 2019-06-04 MED ORDER — SODIUM CHLORIDE 0.9% IV SOLUTION: Freq: Once | INTRAVENOUS | Status: AC

## 2019-06-04 MED ORDER — CHLORHEXIDINE GLUCONATE CLOTH 2 % EX PADS
6.0000 | MEDICATED_PAD | Freq: Once | CUTANEOUS | Status: AC
  Administered 2019-06-04: 6 via TOPICAL

## 2019-06-04 MED ORDER — INSULIN REGULAR(HUMAN) IN NACL 100-0.9 UT/100ML-% IV SOLN
INTRAVENOUS | Status: AC
  Administered 2019-06-05: 1 [IU]/h via INTRAVENOUS
  Filled 2019-06-04: qty 100

## 2019-06-04 MED ORDER — POLYVINYL ALCOHOL 1.4 % OP SOLN
1.0000 [drp] | Freq: Four times a day (QID) | OPHTHALMIC | Status: DC
  Administered 2019-06-04 – 2019-06-08 (×13): 1 [drp] via OPHTHALMIC
  Filled 2019-06-04: qty 15

## 2019-06-04 MED ORDER — TRANEXAMIC ACID (OHS) PUMP PRIME SOLUTION
2.0000 mg/kg | INTRAVENOUS | Status: DC
  Filled 2019-06-04: qty 1.45

## 2019-06-04 MED ORDER — BISACODYL 5 MG PO TBEC: 5.0000 mg | DELAYED_RELEASE_TABLET | Freq: Once | ORAL | Status: DC

## 2019-06-04 MED ORDER — NITROGLYCERIN IN D5W 200-5 MCG/ML-% IV SOLN
2.0000 ug/min | INTRAVENOUS | Status: DC
  Filled 2019-06-04: qty 250

## 2019-06-04 MED ORDER — SODIUM CHLORIDE 0.9% IV SOLUTION
Freq: Once | INTRAVENOUS | Status: AC
  Administered 2019-06-04: 23:00:00 via INTRAVENOUS

## 2019-06-04 MED ORDER — SODIUM CHLORIDE 0.9 % IV SOLN
750.0000 mg | INTRAVENOUS | Status: AC
  Administered 2019-06-05: 750 mg via INTRAVENOUS
  Filled 2019-06-04: qty 750

## 2019-06-04 MED ORDER — SODIUM CHLORIDE 0.9 % IV SOLN
INTRAVENOUS | Status: DC
  Filled 2019-06-04: qty 30

## 2019-06-04 MED ORDER — PHENYLEPHRINE HCL-NACL 20-0.9 MG/250ML-% IV SOLN
30.0000 ug/min | INTRAVENOUS | Status: AC
  Administered 2019-06-05: 25 ug/min via INTRAVENOUS
  Filled 2019-06-04: qty 250

## 2019-06-04 MED ORDER — HEPARIN (PORCINE) 25000 UT/250ML-% IV SOLN
1100.0000 [IU]/h | INTRAVENOUS | Status: DC
  Administered 2019-06-05: 01:00:00 1000 [IU]/h via INTRAVENOUS
  Filled 2019-06-04: qty 250

## 2019-06-04 MED ORDER — SODIUM CHLORIDE 0.9 % IV SOLN
20.0000 ug | Freq: Once | INTRAVENOUS | Status: AC
  Administered 2019-06-04: 20 ug via INTRAVENOUS
  Filled 2019-06-04 (×2): qty 5

## 2019-06-04 MED ORDER — ALBUMIN HUMAN 5 % IV SOLN
INTRAVENOUS | Status: AC
  Administered 2019-06-04: 12.5 g
  Filled 2019-06-04: qty 250

## 2019-06-04 MED ORDER — MANNITOL 20 % IV SOLN
INTRAVENOUS | Status: DC
  Filled 2019-06-04: qty 13

## 2019-06-04 MED ORDER — PLASMA-LYTE 148 IV SOLN
INTRAVENOUS | Status: DC
  Filled 2019-06-04: qty 2.5

## 2019-06-04 MED ORDER — SODIUM CHLORIDE 0.9% FLUSH
10.0000 mL | INTRAVENOUS | Status: DC | PRN
  Administered 2019-06-04: 40 mL
  Filled 2019-06-04: qty 40

## 2019-06-04 MED ORDER — ALBUMIN HUMAN 5 % IV SOLN
INTRAVENOUS | Status: AC
  Administered 2019-06-04: 12.5 g
  Filled 2019-06-04: qty 500

## 2019-06-04 MED ORDER — SODIUM CHLORIDE 0.9% IV SOLUTION
Freq: Once | INTRAVENOUS | Status: AC
  Administered 2019-06-04: 09:00:00 via INTRAVENOUS

## 2019-06-04 MED ORDER — TRANEXAMIC ACID (OHS) BOLUS VIA INFUSION
15.0000 mg/kg | INTRAVENOUS | Status: AC
  Administered 2019-06-05: 1084.5 mg via INTRAVENOUS
  Filled 2019-06-04: qty 1085

## 2019-06-04 MED ORDER — TEMAZEPAM 15 MG PO CAPS: 15.0000 mg | ORAL_CAPSULE | Freq: Once | ORAL | Status: DC | PRN

## 2019-06-04 MED ORDER — CHLORHEXIDINE GLUCONATE CLOTH 2 % EX PADS
6.0000 | MEDICATED_PAD | Freq: Once | CUTANEOUS | Status: AC
  Administered 2019-06-05: 6 via TOPICAL

## 2019-06-04 MED ORDER — SODIUM CHLORIDE 0.9 % IV SOLN
1.0000 g | Freq: Three times a day (TID) | INTRAVENOUS | Status: DC
  Administered 2019-06-04 – 2019-06-06 (×6): 1 g via INTRAVENOUS
  Filled 2019-06-04 (×10): qty 1

## 2019-06-04 MED ORDER — EPINEPHRINE HCL 5 MG/250ML IV SOLN IN NS
0.0000 ug/min | INTRAVENOUS | Status: DC
  Filled 2019-06-04: qty 250

## 2019-06-04 MED ORDER — DOPAMINE-DEXTROSE 3.2-5 MG/ML-% IV SOLN
0.0000 ug/kg/min | INTRAVENOUS | Status: DC
  Filled 2019-06-04: qty 250

## 2019-06-04 MED ORDER — DEXMEDETOMIDINE HCL IN NACL 400 MCG/100ML IV SOLN
0.1000 ug/kg/h | INTRAVENOUS | Status: AC
  Administered 2019-06-05: .3 ug/kg/h via INTRAVENOUS
  Filled 2019-06-04: qty 100

## 2019-06-04 MED ORDER — ALBUMIN HUMAN 5 % IV SOLN
12.5000 g | Freq: Once | INTRAVENOUS | Status: AC
  Administered 2019-06-05: 12.5 g via INTRAVENOUS
  Filled 2019-06-04: qty 250

## 2019-06-04 MED ORDER — METOPROLOL TARTRATE 12.5 MG HALF TABLET
12.5000 mg | ORAL_TABLET | Freq: Once | ORAL | Status: AC
  Administered 2019-06-05: 12.5 mg via ORAL
  Filled 2019-06-04: qty 1

## 2019-06-04 MED ORDER — MILRINONE LACTATE IN DEXTROSE 20-5 MG/100ML-% IV SOLN
0.3000 ug/kg/min | INTRAVENOUS | Status: DC
  Filled 2019-06-04: qty 100

## 2019-06-04 MED ORDER — POTASSIUM CHLORIDE 2 MEQ/ML IV SOLN
80.0000 meq | INTRAVENOUS | Status: DC
  Filled 2019-06-04: qty 40

## 2019-06-04 MED ORDER — VANCOMYCIN HCL 10 G IV SOLR
1250.0000 mg | INTRAVENOUS | Status: AC
  Administered 2019-06-05: 1250 mg via INTRAVENOUS
  Filled 2019-06-04: qty 1250

## 2019-06-04 MED ORDER — SODIUM CHLORIDE 0.9% FLUSH
10.0000 mL | Freq: Two times a day (BID) | INTRAVENOUS | Status: DC
  Administered 2019-06-04: 40 mL
  Administered 2019-06-04: 10 mL

## 2019-06-04 MED ORDER — CHLORHEXIDINE GLUCONATE 0.12 % MT SOLN
15.0000 mL | Freq: Once | OROMUCOSAL | Status: AC
  Administered 2019-06-05: 15 mL via OROMUCOSAL
  Filled 2019-06-04: qty 15

## 2019-06-04 NOTE — Progress Notes (Signed)
PT Cancellation Note  Patient Details Name: Raymond Chavez MRN: [id number] DOB: 1951-03-19   Cancelled Treatment:    Reason Eval/Treat Not Completed: Medical issues which prohibited therapy. Pt remains medically inappropriate, intubated, sedated, on ECMO with L ventricle vent placed. Acute PT will sign off at this time. Please re-consult acute PT services when patient is more alert and medically appropriate for PT intervention.  Zenaida Niece, PT, DPT Acute Rehabilitation Pager: [phone]    Zenaida Niece 06/04/2019, 9:37 AM

## 2019-06-04 NOTE — Anesthesia Postprocedure Evaluation (Signed)
Anesthesia Post Note  Patient: Raymond Chavez  Procedure(s) Performed: REMOVAL OF IMPELLA LEFT VENTRICULAR ASSIST DEVICE (N/A ) TRANSESOPHAGEAL ECHOCARDIOGRAM (TEE) (N/A ) Chest Tube Insertion INSERTION OF LEFT VENTRICLE VENT FOR ECMO     Patient location during evaluation: SICU Anesthesia Type: General Level of consciousness: patient remains intubated per anesthesia plan Pain management: satisfactory to patient Vital Signs Assessment: post-procedure vital signs reviewed and stable Respiratory status: patient remains intubated per anesthesia plan Cardiovascular status: stable Postop Assessment: no apparent nausea or vomiting Anesthetic complications: no    Last Vitals:  Vitals:   06/04/19 1145 06/04/19 1200  BP:    Pulse:    Resp: 10 10  Temp:    SpO2: 100%     Last Pain:  Vitals:   06/04/19 1105  TempSrc: Oral  PainSc:                  Raymond Chavez

## 2019-06-04 NOTE — Progress Notes (Signed)
1 Day Post-Op Procedure(s) (LRB): REMOVAL OF IMPELLA LEFT VENTRICULAR ASSIST DEVICE (N/A) TRANSESOPHAGEAL ECHOCARDIOGRAM (TEE) (N/A) Chest Tube Insertion INSERTION OF LEFT VENTRICLE VENT FOR ECMO Subjective: Intubated/sedated  Objective: Vital signs in last 24 hours: Temp:  [97.3 F (36.3 C)-99.3 F (37.4 C)] 99 F (37.2 C) (11/10 0600) Pulse Rate:  [51-74] 71 (11/10 0448) Cardiac Rhythm: Atrial fibrillation (11/10 0400) Resp:  [10-20] 10 (11/10 0800) BP: (0)/(0) 0/0 (11/10 0600) SpO2:  [99 %-100 %] 100 % (11/10 0600) Arterial Line BP: (60-100)/(57-88) 60/57 (11/10 0400) FiO2 (%):  [30 %] 30 % (11/10 0600) Weight:  [72.3 kg] 72.3 kg (11/10 0432)  Hemodynamic parameters for last 24 hours: CVP:  [2 mmHg-11 mmHg] 9 mmHg  Intake/Output from previous day: 11/09 0701 - 11/10 0700 In: 7144.8 [I.V.:1408.6; Blood:3382.7; NG/GT:1290; IV Piggyback:938.7] Out: 3762 [Emesis/NG output:500; Drains:30; Stool:1130; Chest Tube:2650] Intake/Output this shift: Total I/O In: -  Out: 236 [Other:236]  General appearance: intubated; appearing warm and well-perfused, pink Neurologic: unable to assess Heart: irregularly irregular rhythm Lungs: clear to auscultation bilaterally Abdomen: soft, non-tender; bowel sounds normal; no masses,  no organomegaly Extremities: edema 2+; TED hose on BLEs Wound: dressed, dry  Lab Results: Recent Labs    06/13/2019 1653  06/04/19 0217 06/04/19 0556  WBC 9.2  --  9.3  --   HGB 7.6*   < > 6.9* 7.8*  HCT 22.5*   < > 20.3* 23.0*  PLT 39*  --  41*  --    < > = values in this interval not displayed.   BMET:  Recent Labs    05/28/2019 1653  06/04/19 0217 06/04/19 0556  NA 139   < > 136 138  K 5.2*   < > 5.0 5.0  CL 107  --  104  --   CO2 23  --  23  --   GLUCOSE 120*  --  150*  --   BUN 48*  --  45*  --   CREATININE 1.61*  --  1.39*  --   CALCIUM 6.9*  --  7.1*  --    < > = values in this interval not displayed.    PT/INR:  Recent Labs   06/04/19 0217  LABPROT 16.9*  INR 1.4*   ABG    Component Value Date/Time   PHART 7.460 (H) 06/04/2019 0556   HCO3 30.0 (H) 06/04/2019 0556   TCO2 31 06/04/2019 0556   ACIDBASEDEF 1.0 06/14/2019 0255   O2SAT 100.0 06/04/2019 0556   CBG (last 3)  Recent Labs    06/03/2019 1954 06/15/2019 2351 06/04/19 0553  GLUCAP 179* 143* 142*    Assessment/Plan: S/P Procedure(s) (LRB): REMOVAL OF IMPELLA LEFT VENTRICULAR ASSIST DEVICE (N/A) TRANSESOPHAGEAL ECHOCARDIOGRAM (TEE) (N/A) Chest Tube Insertion INSERTION OF LEFT VENTRICLE VENT FOR ECMO Diuresis CT head-to-toe to r/o CVA, pleural effusions, and intra-abdominal processes  Very tentatively considering OR tomorrow for CABG +/- AVR. While the timing may not be great right now, longer time spent on ECMO for minimal gain comes at the risk of ECMO-related complication including on-going bleeding, stroke, pump malfunction, and infection among other problems. Daughter is aware of this thought process.   Kaya Klausing Z. [name], MD [phone]   LOS: 19 days    Wonda Olds 06/04/2019

## 2019-06-04 NOTE — Progress Notes (Signed)
Patient ID: Raymond Chavez, male   DOB: [birth year], 68 y.o.   MRN: [id number]    Advanced Heart Failure Rounding Note   Subjective:    Events - Cath 10/23 with sever 3v CAD and cardiogenic shock. Impella CP placed - Underwent placement of Impella 5.0 on 10/24 with removal of R femoral Impella CP.  - Extubated 11/1, CVVH held on 11/3 given  - He had a possible aspiration event afternoon 11/3 then became progressively hypoxemic during the night.  He had to be re-intubated.  Bronchoscopy showed copious blood return suggesting hemoptysis. He developed PEA arrest during this time and underwent CPR  -11/4 initiate ECMO.  Patient went to OR, arterial cannula in ascending aorta and venous cannula in right femoral vein. Impella remains in place at P2.  - 11/7 ECMO circuit change for high dP - 11/9 Impella removed, LV vent placed.  Moderate AI noted on TEE after Impella pulled.  Yeast in BAL, started on fluconazole.   Remains intubated/sedated on ECMO, CVVHD.   With low CVP to 3 last night developed "chugging," patient given albumin x 2 and blood, rpms transiently dropped with resolution, flow back to 3.6 this morning on 3300 rpm.   Currently on NE 1, MAP 70s. PLTs 34 => 41. Hgb 6.7. CVP 6 this am. Pulling - 50 on CVVHD.  CXR improved after LV vent placed.  LDH (738 -> 946 -> 1218 -> 985).  He is on vancomycin/meropenem/fluconazole.   Developed AF. Now on IV amio in NSR.   CVVHD pulling - 50 cc/hr  ECMO parameters Flow 3.6L 3300RPM. dP 16.    Enterococcus faecalis on BAL culture.  Patient is now on meropenem   Objective:   Weight Range:  Vital Signs:   Temp:  [97.3 F (36.3 C)-99.3 F (37.4 C)] 99 F (37.2 C) (11/10 0600) Pulse Rate:  [51-74] 71 (11/10 0448) Resp:  [10-20] 10 (11/10 0700) BP: (0)/(0) 0/0 (11/10 0600) SpO2:  [99 %-100 %] 100 % (11/10 0600) Arterial Line BP: (60-100)/(57-88) 60/57 (11/10 0400) FiO2 (%):  [30 %] 30 % (11/10 0600) Weight:  [72.3 kg] 72.3 kg (11/10  0432) Last BM Date: 06/19/2019(Flexiseal)  Weight change: Filed Weights   06/02/19 0414 06/12/2019 0430 06/04/19 0432  Weight: 74.5 kg 72.1 kg 72.3 kg    Intake/Output:   Intake/Output Summary (Last 24 hours) at 06/04/2019 0737 Last data filed at 06/04/2019 0700 Gross per 24 hour  Intake 7144.76 ml  Output 7365 ml  Net -220.24 ml     Physical Exam: General: Intubated/sedated Neck: No JVD, no thyromegaly or thyroid nodule.  Lungs: Coarse BS bilaterally CV: Nondisplaced PMI.  Heart soft regular S1/S2, no S3/S4, no murmur.  1+ edema to knees.   Abdomen: Soft, nontender, no hepatosplenomegaly, no distention.  Skin: Intact without lesions or rashes.  Neurologic: Sedated Extremities: No clubbing or cyanosis.  HEENT: Normal.   Telemetry: NSR 70s with PACs/PVCs (personally reviewed)  Labs: Basic Metabolic Panel: Recent Labs  Lab 05/30/19 1626  05/31/19 0308  05/31/19 1618  06/01/19 0316  06/01/19 1555  06/02/19 0303  06/02/19 1150 06/02/19 1632  06/23/2019 0407  06/19/2019 1013  06/02/2019 1653 06/09/2019 1656 05/31/2019 1957 06/17/2019 2354 06/04/19 0217 06/04/19 0556  NA 138   < > 137   < > 136   < > 137   < > 140   < > 138   < > 137 136   < > 137   < >  --    < >  139 138 138 137 136 138  K 4.7   < > 4.7   < > 4.6   < > 4.6   < > 4.8   < > 4.7   < > 4.6 4.7   < > 4.7   < >  --    < > 5.2* 5.2* 5.0 5.0 5.0 5.0  CL 106  --  107  --  103   < > 105  --  107  --  105  --  104 104  --  104  --   --   --  107  --   --   --  104  --   CO2 25  --  24  --  23   < > 24  --  24  --  25  --  24 25  --  23  --   --   --  23  --   --   --  23  --   GLUCOSE 152*  --  184*  --  172*   < > 210*  --  176*  --  208*  --  199* 164*  --  178*  --   --   --  120*  --   --   --  150*  --   BUN 37*  --  33*  --  31*   < > 32*  --  30*  --  31*  --  34* 35*  --  38*  --   --   --  48*  --   --   --  45*  --   CREATININE 1.69*  --  1.45*  --  1.43*   < > 1.35*  --  1.26*  --  1.29*  --  1.33* 1.32*  --   1.31*  --   --   --  1.61*  --   --   --  1.39*  --   CALCIUM 6.9*  --  6.8*  --  7.2*   < > 7.2*  --  7.1*  --  7.3*  --  8.0* 7.7*  --  7.6*  --   --   --  6.9*  --   --   --  7.1*  --   MG 2.5*  --  2.3  --  2.4  --  2.6*  --  2.4  --   --   --   --   --   --   --   --   --   --   --   --   --   --   --   --   PHOS 4.5  --  3.6  --  3.4  --   --   --   --   --  3.1  --   --   --   --   --   --  2.9  --   --   --   --   --   --   --    < > = values in this interval not displayed.    Liver Function Tests: Recent Labs  Lab 06/02/19 1150 06/02/19 1632 05/30/2019 0407 06/01/2019 1653 06/04/19 0217  AST 38 42* 47* 43* 35  ALT _0 ALKPHOS 68 71 85 61 65  BILITOT 2.0* 1.8* 1.7* 1.6* 1.8*  PROT 4.7* 4.7* 5.0* 3.8* 4.3*  ALBUMIN 2.1* 2.1* 2.1* 1.7* 2.2*   No results for input(s): LIPASE, AMYLASE in the last 168 hours. No results for input(s): AMMONIA in the last 168 hours.  CBC: Recent Labs  Lab 05/27/2019 0211  06/04/2019 2308  05/30/19 0550  06/02/19 1150 06/02/19 1632  06/02/19 2302  06/21/2019 0407  06/16/2019 1653 06/13/2019 1656 05/30/2019 1957 06/21/2019 2354 06/04/19 0217 06/04/19 0556  WBC 30.1*   < > 19.3*   < > 15.6*   < > 14.9* 14.7*  --  16.5*  --  15.6*  --  9.2  --   --   --  9.3  --   NEUTROABS 22.3*  --  15.6*  --  12.9*  --  13.6*  --   --   --   --   --   --   --   --   --   --   --   --   HGB 5.9*   < > 11.0*   < > 9.0*   < > 8.1* 7.9*   < > 8.9*   < > 8.6*   < > 7.6* 7.1* 6.8* 7.8* 6.9* 7.8*  HCT 18.0*   < > 31.7*   < > 26.4*   < > 24.1* 23.8*   < > 26.5*   < > 25.8*   < > 22.5* 21.0* 20.0* 23.0* 20.3* 23.0*  MCV 91.4   < > 88.5   < > 90.1   < > 93.1 93.3  --  92.0  --  92.8  --  92.6  --   --   --  90.2  --   PLT 54*   < > 46*   < > 97*   < > 32* 35*  --  34*  --  34*  --  39*  --   --   --  41*  --    < > = values in this interval not displayed.    Cardiac Enzymes: No results for input(s): CKTOTAL, CKMB, CKMBINDEX, TROPONINI in the last 168  hours.  BNP: BNP (last 3 results) Recent Labs    04/25/2019 1739 05/13/2019 1234  BNP 2,725.3* 3,370.0*    ProBNP (last 3 results) No results for input(s): PROBNP in the last 8760 hours.    Other results:  Imaging: Dg Chest 1 View  Result Date: 06/14/2019 CLINICAL DATA:  Open heart surgery. EXAM: CHEST  1 VIEW COMPARISON:  Multiple recent chest films. The most recent is earlier from today at 9:33 a.m. FINDINGS: The endotracheal tube, right IJ catheter, right PICC line, right subclavian Impella device, left IJ catheter and left ventricular assist device are stable. Persistent but slightly improved asymmetric pulmonary edema. No large pleural effusions or pneumothorax. IMPRESSION: 1. Stable support apparatus. 2. Slight improved lung aeration with slight decrease and asymmetric pattern of pulmonary edema. Electronically Signed   By: Marijo Sanes M.D.   On: 06/22/2019 12:48   Dg Chest 1 View  Result Date: 06/21/2019 CLINICAL DATA:  Endotracheal tube. EXAM: CHEST  1 VIEW COMPARISON:  June 02, 2019. FINDINGS: Stable cardiomediastinal silhouette. Stable endotracheal and nasogastric tubes. Stable bilateral jugular catheters. Stable right-sided PICC line. Stable left ventricular assist device. No pneumothorax is noted. Stable bilateral lung opacities are noted. Bony thorax is unremarkable. IMPRESSION: Stable support apparatus.  Stable bilateral lung opacities. Electronically Signed   By: Marijo Conception M.D.   On: 05/31/2019 07:44   Dg Chest [facility] 1 View  Result  Date: 06/04/2019 CLINICAL DATA:  ECMO. EXAM: PORTABLE CHEST 1 VIEW COMPARISON:  05/27/2019 FINDINGS: The patient remains intubated. The in scratched at the endotracheal tube is in stable position 2.5 cm above the carina. ECMO catheters are stable. Mediastinal drain is stable. Left IJ line is again noted. Diffuse interstitial edema is unchanged. IMPRESSION: 1. Stable appearance of interstitial edema. 2. Support apparatus, including ECMO  catheters, are stable. Electronically Signed   By: San Morelle M.D.   On: 06/04/2019 06:09   Dg Chest Port 1 View  Result Date: 06/11/2019 CLINICAL DATA:  Status post cardiac surgery. EXAM: PORTABLE CHEST 1 VIEW COMPARISON:  June 03, 2019 FINDINGS: The endotracheal tube terminates just above the carina. There is a right PICC line and right-sided central venous catheter that appear grossly stable in positioning. A mediastinal drain is noted. There is an enteric feeding tube that is looped in the region of the GE junction and may be kinked. There is a secondary enteric tube that projects below the left hemidiaphragm. The patient's Impella device has been removed. ECMO catheters are now identified. There is worsening pulmonary edema. No convincing pneumothorax. Probable moderate-sized bilateral pleural effusions. IMPRESSION: 1. Endotracheal tube terminates just above the carina. Repositioning should be considered. 2. Lines and tubes as above.  ECMO catheters are now identified. 3. Worsening pulmonary edema. Electronically Signed   By: Constance Holster M.D.   On: 06/12/2019 18:26     Medications:     Scheduled Medications:  acetaminophen (TYLENOL) oral liquid 160 mg/5 mL  650 mg Per Tube Once   Or   acetaminophen  650 mg Rectal Once   arformoterol  15 mcg Nebulization BID   aspirin  81 mg Per Tube Daily   atorvastatin  10 mg Per Tube q1800   B-complex with vitamin C  1 tablet Per Tube Daily   chlorhexidine gluconate (MEDLINE KIT)  15 mL Mouth Rinse BID   Chlorhexidine Gluconate Cloth  6 each Topical Daily   feeding supplement (PRO-STAT SUGAR FREE 64)  30 mL Per Tube QID   guaiFENesin  30 mL Per Tube BID   insulin aspart  1-3 Units Subcutaneous Q4H   mouth rinse  15 mL Mouth Rinse 10 times per day   mupirocin ointment   Nasal BID   pantoprazole  40 mg Intravenous Q12H    Infusions:   prismasol BGK 4/2.5 400 mL/hr at 06/04/19 0421    prismasol BGK 4/2.5 300  mL/hr at 06/09/2019 1002   amiodarone Stopped (05/27/2019 1747)   feeding supplement (VITAL 1.5 CAL) 1,000 mL (05/28/2019 0924)   fentaNYL infusion INTRAVENOUS 250 mcg/hr (06/04/19 0700)   fluconazole (DIFLUCAN) IV     heparin 1,200 Units/hr (06/04/19 0700)   meropenem (MERREM) IV     midazolam 6 mg/hr (06/04/19 0700)   norepinephrine (LEVOPHED) Adult infusion 1 mcg/min (06/04/19 0700)   prismasol BGK 4/2.5 1,800 mL/hr at 06/04/19 0619   vancomycin Stopped (06/01/2019 1900)    PRN Medications: docusate, heparin, influenza vaccine adjuvanted, midazolam, morphine injection, ondansetron (ZOFRAN) IV, pneumococcal 23 valent vaccine   Assessment/Plan:   1. Shock: Possible mixed cardiogenic/septic with fever and suspected PNA.  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  Echo with EF 20%, RV moderately HK.  Impella CP placed 10/23. Switched for Impella 5.0 on 10/24. No flow meter on Impella due to damage to sensor on insertion. Patient has LBBB.  PEA arrest 11/3, ECMO started 11/4. ECMO circuit  changed due to high dP and rising LDH on 11/7.  On 11/9 with ongoing rise in LDH, Impella removed and LV vent placed.  Now at 3300 RPM with 3.6L flow. LDH starting to trend down.  MAP 70s on NE 1. CVVH net UF average 50 cc/hr, CVP 6. CXR improved, LV vent appears to have well-decompressed the LV - Aim for CVVH net 30-50 today with CVP 6.   - Continue ECMO at current flow.  - Placed TED hose to help with LE edema.   - Heparin ongoing. Has had bleeding from chest tubes and getting PRBCs x 2 this morning. - D/w ECMO coordinator and ECMO specialist  2. CAD:  NSTEMI, hs-TnI 16,000. 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  - continue ASA/statin. No b-blocker with shock - Goal is eventual CABG at some point, discussed with Dr. [name] 3. AKI: Baseline creatinine 1.3, now AKI likely due to ATN from shock and contrast (had CTA for PE at admission, only  25 cc contrast with cath).  CVVH ongoing.  - Continue CVVH. Management as above 4. Acute Respiratory Failure: Has had PNA with Enterococcus faecalis in BAL cultures. Now re-intubated.  He had PEA arrest with possible aspiration 11/3 and also had pulmonary hemorrhage.  Hemoptysis appears to have stopped. CXR still with diffuse infiltrates but improved with LV venting. Hopefully mostly pulmonary edema but ?ARDS component.  - CVVH management as above. - Continue vanco/meropenem/fluconazole per CCM.  5. ID: Suspected PNA, CXR with diffuse infiltrates. Enterococcus faecalis in BAL cultures initially, now with yeast. Afebrile.  - Continue meropenem, vancomycin, fluconazole.  6. LBBB: Unsure chronicity.  7. Anemia: Hgb 6.7 today, replacing blood products 8. Thrombocytopenia: HIT negative initially.  ?Low due to sepsis versus hemolysis. Plts 37 => 41 today, getting a unit plts with chest tube bleeding.  - Resend HIT with prolonged heparin.  9. PVCs/NSVT: Quiescent.  10. FEN: Cortrak,getting post-pyloric TFs.  11. PAF with post-termination pauses: NSR today.  - Continue amio and heparin  CRITICAL CARE Performed by: Loralie Champagne  Total critical care time: 45 minutes  Critical care time was exclusive of separately billable procedures and treating other patients.  Critical care was necessary to treat or prevent imminent or life-threatening deterioration.  Critical care was time spent personally by me on the following activities: development of treatment plan with patient and/or surrogate as well as nursing, discussions with consultants, evaluation of patient's response to treatment, examination of patient, obtaining history from patient or surrogate, ordering and performing treatments and interventions, ordering and review of laboratory studies, ordering and review of radiographic studies, pulse oximetry and re-evaluation of patient's condition.  Loralie Champagne 06/04/2019 7:37 AM

## 2019-06-04 NOTE — Progress Notes (Signed)
OT Cancellation Note  Patient Details Name: Raymond Chavez MRN: [id number] DOB: May 14, 1951   Cancelled Treatment:    Reason Eval/Treat Not Completed: Medical issues which prohibited therapy. Pt remains medically inappropriate, intubated, sedated, on ECMO with L ventricle vent placed. Acute OT to sign off, please re-consult when medically appropriate.   Delight Stare, OT Acute Rehabilitation Services Pager [phone] Office [phone]    Delight Stare 06/04/2019, 11:21 AM

## 2019-06-04 NOTE — Progress Notes (Signed)
Patient with low flows (3.1-3.2) and elevated Pven pressures (-100) when pulling negative on CRRT.  Discussed with Dr. [name] at bedside and decision made to run patient even on CRRT. This has been communicated to patients bedside nurse.

## 2019-06-04 NOTE — Progress Notes (Signed)
RT NOTE: RT completed about a 90 second recruitment on patient per Dr.Byrum who was present at bedside. PC 25 PEEP 12 per MD. RN at bedside. After recruitment MD stated that he wanted the peep to stay at 12 and return to previous vent settings. RT will continue to monitor.

## 2019-06-04 NOTE — Progress Notes (Signed)
Pharmacy Anti-Coagulation Consult Note:  Pharmacy Consult for Heparin Indication for heparin: Impella 5.0 & ECMO  No Known Allergies  Patient Measurements: Height: 5\' 7"  (170.2 cm)(measured x3) Weight: 159 lb 6.3 oz (72.3 kg) IBW/kg (Calculated) : 66.1  Vital Signs: Temp: 98.3 F (36.8 C) (11/10 1050) Temp Source: Oral (11/10 1050) BP: 0/0 (11/10 0600) Pulse Rate: 65 (11/10 1058)  Labs: Recent Labs    06/02/19 2302  06/10/2019 0407  06/13/2019 1054  06/15/2019 1653  06/01/2019 1957  06/04/19 0217 06/04/19 0556 06/04/19 0757  HGB 8.9*   < > 8.6*   < >  --    < > 7.6*   < > 6.8*   < > 6.9* 7.8* 8.3*  HCT 26.5*   < > 25.8*   < >  --    < > 22.5*   < > 20.0*   < > 20.3* 23.0* 24.5*  PLT 34*  --  34*  --   --   --  39*  --   --   --  41*  --  82*  APTT 61*  --  60*  --  67*  --  43*  --  57*  --  65*  --   --   LABPROT  --   --  16.5*  --   --   --  18.0*  --   --   --  16.9*  --   --   INR  --   --  1.4*  --   --   --  1.5*  --   --   --  1.4*  --   --   HEPARINUNFRC 0.16*  --   --   --  0.19*  --   --   --   --   --   --   --  0.17*  CREATININE  --   --  1.31*  --   --   --  1.61*  --   --   --  1.39*  --   --    < > = values in this interval not displayed.    Estimated Creatinine Clearance: 46.9 mL/min (A) (by C-G formula based on SCr of 1.39 mg/dL (H)).  Assessment: 68 yo M admitted with shock and concern for ACS now s/p Impella CP placement in cath lab. Impella CP swapped out for 5.0 d/t hemolysis. Underwent [facility] ECMO with goal of CABG for revascularization.   ECMO initiate about 1800 11/4 - originally started on heparin at 1500 units/hr systemically, which was held for bleeding.   Impella removed 11/9.  Heparin now systemic at 1200 units/hr.   Fibrinogen 700 > 452, LDH down 1200 > 985  Consistently losing ~ 200 ml/hr blood in CT tubes.  Discussed with Dr. [name], heparin stopped at 1130 AM.  Plan to resume in 2 hrs.   Goal of Therapy:  Reduce goal to 50-55 overnight  d/t bleeding Monitor platelets by anticoagulation protocol: Yes   Plan:  Stop heparin until 1330 pm.  Then restart at lower rate of 1000 units/hr. Heparin level and aPTT 8 hrs after gtt resumes. Daily heparin level/aPTT Tentatively planning for CABG tomorrow.  **Pharmacist phone directory can now be found on [email or web address] ([email or web address]).  Listed under [place].  Marguerite Olea, [facility] Clinical Pharmacist Phone [phone]  06/04/2019 11:39 AM

## 2019-06-04 NOTE — Progress Notes (Signed)
Patient transported to CT with RN, RT and ECMO specialist x2 without incident.

## 2019-06-04 NOTE — Progress Notes (Signed)
RT NOTE: RT  transported patient from 2H03 to CT and back with RN x3 and transport. No complications. RT will continue to monitor.

## 2019-06-04 NOTE — Progress Notes (Signed)
NAME:  Raymond Chavez, MRN:  [id number], DOB:  1950-12-10, LOS: 47 ADMISSION DATE:  05/20/2019, CONSULTATION DATE:  05/20/2019 REFERRING MD:  Billy Fischer  CHIEF COMPLAINT:  SOB   Brief History   Raymond Chavez is a 68 y.o. male who was admitted with acute hypoxic respiratory failure due to acute pulmonary edema in setting of NSTEMI, cardiogenic shock, possible PNA, He required intubation in the ED after failing BiPAP. On impella for hemodynamic support, ECMO on 11/4 CVTS and cardiology are discussing about CABG, LVAD  Past Medical History  HTN, CAD.  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  10/27-heparin stopped due to low platelets.  HIT panel sent.  Starting bivalirudin.  Off Levophed.  On minimal vent support but failed weaning trials 10/28 Bronchoscopy  10/29 Started on CRRT 10/30 Bronchoscopy with mucous plug removal 11/1 Extubated 11/4 progressive shock, respiratory failure requiring reintubation.  Pulmonary hemorrhage, heparin held.  Antibiotics broadened empirically. 11/4 cannulated and VA ECMO initiated 1730 11/5 paralytics stopped 11/9 Impella device out    Consults:  Cardiology PCCM Nephrology   Procedures:  ETT 10/22 >11/1 ETT 11/4 >>  Swan 10/22 > Impella 10/23 >> 10/24 Changed to 5.0 via right axillary >>  Vas cath 10/29  Aline 11/3 ECMO 11/4 >>   Significant Diagnostic Tests:  CTA chest 10/22 > no PE, b/l basilar consolidation, diffuse interlobular septal thickening with GGO's, b/l hilar adenopathy. Echo 11/2: LVEF 15%, impella in place 10/27 UE venous doppler: Right: No evidence of deep vein thrombosis in the upper extremity. However, unable to visualize the IJV, axillary. No evidence of superficial vein thrombosis in the upper extremity. However, unable to visualize the IJV,  axillary. No evidence of thrombosis in the . However, unable to visualize the IJV, axillary. Left: No evidence of thrombosis in the subclavian.     Micro Data:  Blood 10/22, 10/24, 10/28, 10/29 >>> NGTD Sputum 10/24 >>> Neg Urine 10/22 >>>Neg Urine Strep > Neg RVP 10/22 > Neg MRSA PCR > Neg SARS CoV2 10/22 > neg. BAL 10/28 > Enterococcus faecalis (40 K) Blood 10/29: negative Tracheal aspirate 11/8 >> budding yeast >> BAL 11/9 >> budding yeast  Antimicrobials:  Vanc 10/22 >10/25, 10/28>10/31 Cefepime 10/22 >10/26, 10/29>11/1 Ampicillin 11/1>11/4 vanc 11/4-> Merrem11/4-> Fluconazole 11/9 >>   Interim history/subjective:  Bronchoscopy 10/9, obstructing clot removed from distal ETT BAL with budding yeast as above, fluconazole added BAL WBC 64 (92% neutrophils) To the OR for Impella device removal 25/3, uncomplicated.  Some interval chest x-ray improvement in bilateral pulmonary infiltrates  Objective:  Blood pressure (!) 0/0, pulse 71, temperature 99 F (37.2 C), temperature source Core, resp. rate 10, height _0  (1.702 m), weight 72.3 kg, SpO2 100 %. CVP:  [2 mmHg-11 mmHg] 8 mmHg  Vent Mode: PRVC FiO2 (%):  [30 %] 30 % Set Rate:  [10 bmp] 10 bmp Vt Set:  [400 mL] 400 mL PEEP:  [5 cmH20] 5 cmH20 Plateau Pressure:  [18 cmH20-19 cmH20] 18 cmH20   Intake/Output Summary (Last 24 hours) at 06/04/2019 0758 Last data filed at 06/04/2019 0700 Gross per 24 hour  Intake 7144.76 ml  Output 7365 ml  Net -220.24 ml   Filed Weights   06/02/19 0414 06/12/2019 0430 06/04/19 0432  Weight: 74.5 kg 72.1 kg 72.3 kg  Physical Exam: General: Critically ill, sedated, ECMO circuit intact HENT: ET tube in place, unobstructed, no clots Eyes: Pupils equal, no icterus Respiratory: Coarse bilateral breath sounds, inspiratory crackles, no wheeze, VT up to 400 Cardiovascular: Mechanical noise GI: Nondistended, positive bowel sounds Extremities no upper or lower extremity edema Neuro:  Grimace with pain, no response to voice, does have a spontaneous cough  Chest x-ray 11/ 10 reviewed, interval improvement in bilateral interstitial infiltrates, question bilateral effusions  Assessment & Plan:   Acute hypoxic respiratory failure requiring intubation -multifactorial  Pulmonary hemorrhage, appears to have stabilized 11/5 Aspiration event, with presumed pneumonia, question fungal pneumonia ARDS Hiccups Extubated 11/2 and reintubated 11/4 am Chest x-ray 11/10 reviewed, diffuse bilateral infiltrates somewhat improved after Impella device removed 11/9 Plan  -Continue PRVC 6 cc/kg.  He had restoration of his expected tidal volumes and decreased peak and plateau pressures after clearance of obstructing clot in his distal ET tube.  May be a candidate soon to try to increase his effective minute ventilation, see if he can tolerate decreased sweep by ECMO -Fluconazole added to meropenem, vancomycin (both day 7) on 11/9 given tracheal aspirate and BAL results.  Await final culture data -Consider possible empiric steroids given his ARDS, but deferred after suspicion for opportunistic fungal process increased -Continue volume removal as he can tolerate, 4.6 L positive -Continue guaifenesin, may benefit from 3% saline at some point -Hiccups appear to have resolved, Thorazine was considered but never started -high side effect profile -Discussed with Dr. 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  H/O MI 1998, CAD  EF 15-20% with decreased RV systolic function  62/37 RHC/LHC which showed severe 3V CAD (LM 75%, LAD 100%, LCX 80%, RCA 80% prox) and severely depressed LV function Plan -Impella device location confirmed and in good position by echocardiogram 11/5 -Norepinephrine for goal MAP 65 -Still goal for transition to possible CABG depending on improvement in his pulmonary status,  interstitial infiltrates -Following LDH, plateaued on 11/9, remains significantly elevated  Atrial fibrillation Plan -Started amiodarone, converted to NSR  Con-comitant Septic Shock secondary to Enterococcus Faecalis PNA, treated. Aspiration event, probable recurrent pneumonia 11/4.  Possible fungal etiology based on tracheal aspirate and BAL.  At risk acute lung injury/pneumonitis. Plan -Norepinephrine titrated for MAP 65 -Continue empiric meropenem, vancomycin (MRSA nasal swab positive), day 7 -Fluconazole added on 11/9 given tracheal aspirate data, continue and follow culture data -Follow chest x-ray, WBC   Acute Kidney Injury  Plan -CVVHD.  Agree with continued aggressive volume removal as he can tolerate.  CVP 6, may not be able to remove much more.  Will benefit from as much UF as possible from a respiratory standpoint  Acute on chronic normocytic anemia.  Alveolar hemorrhage 11/4, appears to have resolved.  He does have heme positive stool -Follow CBC, goal hemoglobin > 7.0 -Pantoprazole every 12h -PRBC resuscitation as indicated Thrombocytopenia HIT panel negative -Platelet replacement as indicated  ?ileus:  -Bowel regimen  Goals of care:  Goals of care discussions ongoing depending on clinical progress  Best Practice:  Diet: npo Pain/Anxiety/Delirium protocol: Fentanyl, Versed infusions VAP protocol: yes DVT prophylaxis: SCD's  GI prophylaxis: PPI. Glucose control: SSI. Mobility: bedrest Code Status: Full. Family Communication: Ongoing Disposition: ICU.  Labs   CBC: Recent Labs  Lab 06/15/2019 0211  06/14/2019 2308  05/30/19 0550  06/02/19 1150 06/02/19 1632  06/02/19 2302  05/30/2019 0407  06/09/2019 1653 06/12/2019 1656 06/01/2019 1957 05/31/2019 2354 06/04/19 0217 06/04/19 0556  WBC 30.1*   < > 19.3*   < > 15.6*   < > 14.9* 14.7*  --  16.5*  --  15.6*  --  9.2  --   --   --  9.3  --   NEUTROABS 22.3*  --  15.6*  --  12.9*  --  13.6*  --   --   --   --    --   --   --   --   --   --   --   --   HGB 5.9*   < > 11.0*   < > 9.0*   < > 8.1* 7.9*   < > 8.9*   < > 8.6*   < > 7.6* 7.1* 6.8* 7.8* 6.9* 7.8*  HCT 18.0*   < > 31.7*   < > 26.4*   < > 24.1* 23.8*   < > 26.5*   < > 25.8*   < > 22.5* 21.0* 20.0* 23.0* 20.3* 23.0*  MCV 91.4   < > 88.5   < > 90.1   < > 93.1 93.3  --  92.0  --  92.8  --  92.6  --   --   --  90.2  --   PLT 54*   < > 46*   < > 97*   < > 32* 35*  --  34*  --  34*  --  39*  --   --   --  41*  --    < > = values in this interval not displayed.   Basic Metabolic Panel: Recent Labs  Lab 05/30/19 1626  05/31/19 0308  05/31/19 1618  06/01/19 0316  06/01/19 1555  06/02/19 0303  06/02/19 1150 06/02/19 1632  06/01/2019 0407  06/20/2019 1013  06/07/2019 1653 06/19/2019 1656 05/30/2019 1957 06/10/2019 2354 06/04/19 0217 06/04/19 0556  NA 138   < > 137   < > 136   < > 137   < > 140   < > 138   < > 137 136   < > 137   < >  --    < > 139 138 138 137 136 138  K 4.7   < > 4.7   < > 4.6   < > 4.6   < > 4.8   < > 4.7   < > 4.6 4.7   < > 4.7   < >  --    < > 5.2* 5.2* 5.0 5.0 5.0 5.0  CL 106  --  107  --  103   < > 105  --  107  --  105  --  104 104  --  104  --   --   --  107  --   --   --  104  --   CO2 25  --  24  --  23   < > 24  --  24  --  25  --  24 25  --  23  --   --   --  23  --   --   --  23  --   GLUCOSE 152*  --  184*  --  172*   < > 210*  --  176*  --  208*  --  199* 164*  --  178*  --   --   --  120*  --   --   --  150*  --   BUN 37*  --  33*  --  31*   < > 32*  --  30*  --  31*  --  34* 35*  --  38*  --   --   --  48*  --   --   --  45*  --   CREATININE 1.69*  --  1.45*  --  1.43*   < > 1.35*  --  1.26*  --  1.29*  --  1.33* 1.32*  --  1.31*  --   --   --  1.61*  --   --   --  1.39*  --   CALCIUM 6.9*  --  6.8*  --  7.2*   < > 7.2*  --  7.1*  --  7.3*  --  8.0* 7.7*  --  7.6*  --   --   --  6.9*  --   --   --  7.1*  --   MG 2.5*  --  2.3  --  2.4  --  2.6*  --  2.4  --   --   --   --   --   --   --   --   --   --   --   --   --   --    --   --   PHOS 4.5  --  3.6  --  3.4  --   --   --   --   --  3.1  --   --   --   --   --   --  2.9  --   --   --   --   --   --   --    < > = values in this interval not displayed.   GFR: Estimated Creatinine Clearance: 46.9 mL/min (A) (by C-G formula based on SCr of 1.39 mg/dL (H)). Recent Labs  Lab 06/02/19 1632 06/02/19 2302 06/02/2019 0407 05/30/2019 1653 06/04/19 0217  WBC 14.7* 16.5* 15.6* 9.2 9.3  LATICACIDVEN 0.9  --  0.9 1.0 1.2   Liver Function Tests: Recent Labs  Lab 06/02/19 1150 06/02/19 1632 06/09/2019 0407 06/16/2019 1653 06/04/19 0217  AST 38 42* 47* 43* 35  ALT _0 ALKPHOS 68 71 85 61 65  BILITOT 2.0* 1.8* 1.7* 1.6* 1.8*  PROT 4.7* 4.7* 5.0* 3.8* 4.3*  ALBUMIN 2.1* 2.1* 2.1* 1.7* 2.2*   No results for input(s): LIPASE, AMYLASE in the last 168 hours. No results for input(s): AMMONIA in the last 168 hours. ABG    Component Value Date/Time   PHART 7.460 (H) 06/04/2019 0556   PCO2ART 42.2 06/04/2019 0556   PO2ART 536.0 (H) 06/04/2019 0556   HCO3 30.0 (H) 06/04/2019 0556   TCO2 31 06/04/2019 0556   ACIDBASEDEF 1.0 06/19/2019 0255   O2SAT 100.0 06/04/2019 0556    Coagulation Profile: Recent Labs  Lab 06/02/19 0303 06/02/19 1632 05/28/2019 0407 05/27/2019 1653 06/04/19 0217  INR 1.5* 1.5* 1.4* 1.5* 1.4*   Cardiac Enzymes: No results for input(s): CKTOTAL, CKMB, CKMBINDEX, TROPONINI in the last 168 hours. HbA1C: Hgb A1c MFr Bld  Date/Time Value Ref Range Status  04/26/2019 11:25 PM 5.5 4.8 - 5.6 % Final    Comment:    (NOTE) Pre diabetes:          5.7%-6.4% Diabetes:              >  6.4% Glycemic control for   <7.0% adults with diabetes    CBG: Recent Labs  Lab 06/04/2019 1137 06/14/2019 1653 06/13/2019 1954 06/23/2019 2351 06/04/19 0553  GLUCAP 172* 111* 179* 143* 142*    Independent critical care time 35 minutes  Baltazar Apo, MD, PhD 06/04/2019, 7:58 AM [facility] [phone] or if no answer [phone]

## 2019-06-04 NOTE — Progress Notes (Signed)
[facility] NEPHROLOGY PROGRESS NOTE  Assessment/ Plan: Pt is a 68 y.o. yo male with cardiogenic shock who underwent placement of Impella on 10/24, on milrinone and Levophed, cardiac cath with three-vessel disease and echocardiogram revealing EF of 20%, respiratory failure, consulted for AKI.  Baseline creatinine level around 1.3.  #Anuric acute kidney injury on CKD: due to cardiorenal syndrome and contrast nephropathy, complicated by cardiac arrest on 11/3. CRRT since 10/30.  Currently tolerating CRRT.  Continue all 4K solution at current flow settings.  Goal net even, sig losses from chest tube. No clotting, systemic heparinization.Marland Kitchen    #Cardiac arrest/cardiogenic shock: EF 20%, on ECMO.  Had  cardiac arrest on 11/3.   Heart failure team is following.    #Acute respiratory failure with hypoxia: Due to CHF and pneumonia. On mechanical ventilation and ECMO. CCM following  #CAD: NSTEMI, CABG with severe three-vessel disease.   #Hypophosphatemia, hypocalcemia: stable, f/u Phos   Discussed with ICU team.  Subjective:   Impella removed yesterday, now only on ECMO, LV is vented  On low-dose norepinephrine, 30% FiO2  Enterococcus faecalis on BAL culture  Tolerating CRRT, stable flow settings, net even yesterday  No urine output  K5.0 and phosphorus 2.9.  Objective Vital signs in last 24 hours: Vitals:   06/04/19 0700 06/04/19 0800 06/04/19 0832 06/04/19 0858  BP:      Pulse:   77   Resp: _0 Temp:      TempSrc:      SpO2:    100%  Weight:      Height:       Weight change: 0.2 kg  Intake/Output Summary (Last 24 hours) at 06/04/2019 0919 Last data filed at 06/04/2019 0900 Gross per 24 hour  Intake 6885.28 ml  Output 7312 ml  Net -426.72 ml       Labs: Basic Metabolic Panel: Recent Labs  Lab 05/31/19 1618  06/02/19 0303  06/14/2019 0407  06/07/2019 1013  06/20/2019 1653  05/31/2019 2354 06/04/19 0217 06/04/19 0556  NA 136   < > 138   < > 137    < >  --    < > 139   < > 137 136 138  K 4.6   < > 4.7   < > 4.7   < >  --    < > 5.2*   < > 5.0 5.0 5.0  CL 103   < > 105   < > 104  --   --   --  107  --   --  104  --   CO2 23   < > 25   < > 23  --   --   --  23  --   --  23  --   GLUCOSE 172*   < > 208*   < > 178*  --   --   --  120*  --   --  150*  --   BUN 31*   < > 31*   < > 38*  --   --   --  48*  --   --  45*  --   CREATININE 1.43*   < > 1.29*   < > 1.31*  --   --   --  1.61*  --   --  1.39*  --   CALCIUM 7.2*   < > 7.3*   < > 7.6*  --   --   --  6.9*  --   --  7.1*  --   PHOS 3.4  --  3.1  --   --   --  2.9  --   --   --   --   --   --    < > = values in this interval not displayed.   Liver Function Tests: Recent Labs  Lab 06/04/2019 0407 06/11/2019 1653 06/04/19 0217  AST 47* 43* 35  ALT _0 ALKPHOS 85 61 65  BILITOT 1.7* 1.6* 1.8*  PROT 5.0* 3.8* 4.3*  ALBUMIN 2.1* 1.7* 2.2*   No results for input(s): LIPASE, AMYLASE in the last 168 hours. No results for input(s): AMMONIA in the last 168 hours. CBC: Recent Labs  Lab 06/04/2019 2308  05/30/19 0550  06/02/19 1150  06/02/19 2302  06/15/2019 0407  06/07/2019 1653  06/04/19 0217 06/04/19 0556 06/04/19 0757  WBC 19.3*   < > 15.6*   < > 14.9*   < > 16.5*  --  15.6*  --  9.2  --  9.3  --  9.8  NEUTROABS 15.6*  --  12.9*  --  13.6*  --   --   --   --   --   --   --   --   --   --   HGB 11.0*   < > 9.0*   < > 8.1*   < > 8.9*   < > 8.6*   < > 7.6*   < > 6.9* 7.8* 8.3*  HCT 31.7*   < > 26.4*   < > 24.1*   < > 26.5*   < > 25.8*   < > 22.5*   < > 20.3* 23.0* 24.5*  MCV 88.5   < > 90.1   < > 93.1   < > 92.0  --  92.8  --  92.6  --  90.2  --  85.4  PLT 46*   < > 97*   < > 32*   < > 34*  --  34*  --  39*  --  41*  --  82*   < > = values in this interval not displayed.   Cardiac Enzymes: No results for input(s): CKTOTAL, CKMB, CKMBINDEX, TROPONINI in the last 168 hours. CBG: Recent Labs  Lab 06/12/2019 1137 06/07/2019 1653 06/18/2019 1954 06/23/2019 2351 06/04/19 0553  GLUCAP  172* 111* 179* 143* 142*    Iron Studies: No results for input(s): IRON, TIBC, TRANSFERRIN, FERRITIN in the last 72 hours. Studies/Results: Dg Chest 1 View  Result Date: 05/26/2019 CLINICAL DATA:  Open heart surgery. EXAM: CHEST  1 VIEW COMPARISON:  Multiple recent chest films. The most recent is earlier from today at 9:33 a.m. FINDINGS: The endotracheal tube, right IJ catheter, right PICC line, right subclavian Impella device, left IJ catheter and left ventricular assist device are stable. Persistent but slightly improved asymmetric pulmonary edema. No large pleural effusions or pneumothorax. IMPRESSION: 1. Stable support apparatus. 2. Slight improved lung aeration with slight decrease and asymmetric pattern of pulmonary edema. Electronically Signed   By: Marijo Sanes M.D.   On: 05/31/2019 12:48   Dg Chest 1 View  Result Date: 06/01/2019 CLINICAL DATA:  Endotracheal tube. EXAM: CHEST  1 VIEW COMPARISON:  June 02, 2019. FINDINGS: Stable cardiomediastinal silhouette. Stable endotracheal and nasogastric tubes. Stable bilateral jugular catheters. Stable right-sided PICC line. Stable left ventricular assist device. No pneumothorax is noted. Stable bilateral lung opacities are noted. Bony  thorax is unremarkable. IMPRESSION: Stable support apparatus.  Stable bilateral lung opacities. Electronically Signed   By: Marijo Conception M.D.   On: 06/17/2019 07:44   Dg Chest Port 1 View  Result Date: 06/04/2019 CLINICAL DATA:  ECMO. EXAM: PORTABLE CHEST 1 VIEW COMPARISON:  06/02/2019 FINDINGS: The patient remains intubated. The in scratched at the endotracheal tube is in stable position 2.5 cm above the carina. ECMO catheters are stable. Mediastinal drain is stable. Left IJ line is again noted. Diffuse interstitial edema is unchanged. IMPRESSION: 1. Stable appearance of interstitial edema. 2. Support apparatus, including ECMO catheters, are stable. Electronically Signed   By: San Morelle M.D.   On:  06/04/2019 06:09   Dg Chest Port 1 View  Result Date: 06/19/2019 CLINICAL DATA:  Status post cardiac surgery. EXAM: PORTABLE CHEST 1 VIEW COMPARISON:  June 03, 2019 FINDINGS: The endotracheal tube terminates just above the carina. There is a right PICC line and right-sided central venous catheter that appear grossly stable in positioning. A mediastinal drain is noted. There is an enteric feeding tube that is looped in the region of the GE junction and may be kinked. There is a secondary enteric tube that projects below the left hemidiaphragm. The patient's Impella device has been removed. ECMO catheters are now identified. There is worsening pulmonary edema. No convincing pneumothorax. Probable moderate-sized bilateral pleural effusions. IMPRESSION: 1. Endotracheal tube terminates just above the carina. Repositioning should be considered. 2. Lines and tubes as above.  ECMO catheters are now identified. 3. Worsening pulmonary edema. Electronically Signed   By: Constance Holster M.D.   On: 05/31/2019 18:26    Medications: Infusions: .  prismasol BGK 4/2.5 400 mL/hr at 06/04/19 0421  .  prismasol BGK 4/2.5 300 mL/hr at 06/12/2019 1002  . albumin human    . amiodarone Stopped (06/14/2019 1747)  . feeding supplement (VITAL 1.5 CAL) 1,000 mL (06/22/2019 0924)  . fentaNYL infusion INTRAVENOUS 250 mcg/hr (06/04/19 0700)  . fluconazole (DIFLUCAN) IV    . heparin 1,200 Units/hr (06/04/19 0700)  . meropenem (MERREM) IV 1 g (06/04/19 0754)  . midazolam 6 mg/hr (06/04/19 0825)  . norepinephrine (LEVOPHED) Adult infusion 1 mcg/min (06/04/19 0700)  . prismasol BGK 4/2.5 1,800 mL/hr at 06/04/19 0619  . vancomycin Stopped (05/30/2019 1900)    Scheduled Medications: . sodium chloride   Intravenous Once  . acetaminophen (TYLENOL) oral liquid 160 mg/5 mL  650 mg Per Tube Once   Or  . acetaminophen  650 mg Rectal Once  . arformoterol  15 mcg Nebulization BID  . aspirin  81 mg Per Tube Daily  . atorvastatin  10  mg Per Tube q1800  . B-complex with vitamin C  1 tablet Per Tube Daily  . chlorhexidine gluconate (MEDLINE KIT)  15 mL Mouth Rinse BID  . Chlorhexidine Gluconate Cloth  6 each Topical Daily  . feeding supplement (PRO-STAT SUGAR FREE 64)  30 mL Per Tube QID  . guaiFENesin  30 mL Per Tube BID  . insulin aspart  1-3 Units Subcutaneous Q4H  . mouth rinse  15 mL Mouth Rinse 10 times per day  . mupirocin ointment   Nasal BID  . pantoprazole  40 mg Intravenous Q12H    have reviewed scheduled and prn medications.  Physical Exam:  General: Intubated and sedated, critically ill Heart:RRR, s1s2 nl Lungs: Bibasal coarse breath sound, no wheezing Abdomen:soft, Non-tender, non-distended Extremities: LE edema + Dialysis Access: Left IJ catheter site clean.  Multiple line for  ECMO.  Rexene Agent 06/04/2019,9:19 AM  LOS: 19 days

## 2019-06-05 ENCOUNTER — Inpatient Hospital Stay (HOSPITAL_COMMUNITY): Payer: PPO

## 2019-06-05 ENCOUNTER — Inpatient Hospital Stay (HOSPITAL_COMMUNITY): Payer: PPO | Admitting: Anesthesiology

## 2019-06-05 ENCOUNTER — Inpatient Hospital Stay (HOSPITAL_COMMUNITY): Admission: EM | Disposition: E | Payer: Self-pay | Source: Home / Self Care | Attending: Cardiothoracic Surgery

## 2019-06-05 DIAGNOSIS — Z953 Presence of xenogenic heart valve: Secondary | ICD-10-CM

## 2019-06-05 DIAGNOSIS — I351 Nonrheumatic aortic (valve) insufficiency: Secondary | ICD-10-CM

## 2019-06-05 DIAGNOSIS — Z951 Presence of aortocoronary bypass graft: Secondary | ICD-10-CM

## 2019-06-05 DIAGNOSIS — I2510 Atherosclerotic heart disease of native coronary artery without angina pectoris: Secondary | ICD-10-CM | POA: Diagnosis present

## 2019-06-05 HISTORY — PX: CORONARY ARTERY BYPASS GRAFT: SHX141

## 2019-06-05 HISTORY — PX: CLIPPING OF ATRIAL APPENDAGE: SHX5773

## 2019-06-05 HISTORY — PX: AORTIC VALVE REPLACEMENT: SHX41

## 2019-06-05 HISTORY — PX: CANNULATION FOR ECMO (EXTRACORPOREAL MEMBRANE OXYGENATION): SHX6796

## 2019-06-05 HISTORY — PX: VIDEO BRONCHOSCOPY: SHX5072

## 2019-06-05 HISTORY — PX: TEE WITHOUT CARDIOVERSION: SHX5443

## 2019-06-05 LAB — CBC WITH DIFFERENTIAL/PLATELET
Abs Immature Granulocytes: 0 10*3/uL (ref 0.00–0.07)
Band Neutrophils: 6 %
Basophils Absolute: 0 10*3/uL (ref 0.0–0.1)
Basophils Relative: 0 %
Eosinophils Absolute: 0.7 10*3/uL — ABNORMAL HIGH (ref 0.0–0.5)
Eosinophils Relative: 4 %
HCT: 40.5 % (ref 39.0–52.0)
Hemoglobin: 13.6 g/dL (ref 13.0–17.0)
Lymphocytes Relative: 3 %
Lymphs Abs: 0.5 10*3/uL — ABNORMAL LOW (ref 0.7–4.0)
MCH: 29.8 pg (ref 26.0–34.0)
MCHC: 33.6 g/dL (ref 30.0–36.0)
MCV: 88.8 fL (ref 80.0–100.0)
Monocytes Absolute: 1.2 10*3/uL — ABNORMAL HIGH (ref 0.1–1.0)
Monocytes Relative: 7 %
Neutro Abs: 15.1 10*3/uL — ABNORMAL HIGH (ref 1.7–7.7)
Neutrophils Relative %: 80 %
Platelets: 79 10*3/uL — ABNORMAL LOW (ref 150–400)
RBC: 4.56 MIL/uL (ref 4.22–5.81)
RDW: 15.3 % (ref 11.5–15.5)
WBC: 17.6 10*3/uL — ABNORMAL HIGH (ref 4.0–10.5)
nRBC: 0.4 % — ABNORMAL HIGH (ref 0.0–0.2)

## 2019-06-05 LAB — POCT I-STAT 7, (LYTES, BLD GAS, ICA,H+H)
Acid-Base Excess: 5 mmol/L — ABNORMAL HIGH (ref 0.0–2.0)
Acid-Base Excess: 6 mmol/L — ABNORMAL HIGH (ref 0.0–2.0)
Bicarbonate: 29.1 mmol/L — ABNORMAL HIGH (ref 20.0–28.0)
Bicarbonate: 29.2 mmol/L — ABNORMAL HIGH (ref 20.0–28.0)
Calcium, Ion: 1.01 mmol/L — ABNORMAL LOW (ref 1.15–1.40)
Calcium, Ion: 1.08 mmol/L — ABNORMAL LOW (ref 1.15–1.40)
HCT: 18 % — ABNORMAL LOW (ref 39.0–52.0)
HCT: 23 % — ABNORMAL LOW (ref 39.0–52.0)
Hemoglobin: 6.1 g/dL — CL (ref 13.0–17.0)
Hemoglobin: 7.8 g/dL — ABNORMAL LOW (ref 13.0–17.0)
O2 Saturation: 100 %
O2 Saturation: 100 %
Patient temperature: 98
Patient temperature: 98.7
Potassium: 4.7 mmol/L (ref 3.5–5.1)
Potassium: 5 mmol/L (ref 3.5–5.1)
Sodium: 138 mmol/L (ref 135–145)
Sodium: 139 mmol/L (ref 135–145)
TCO2: 30 mmol/L (ref 22–32)
TCO2: 30 mmol/L (ref 22–32)
pCO2 arterial: 33 mmHg (ref 32.0–48.0)
pCO2 arterial: 39.1 mmHg (ref 32.0–48.0)
pH, Arterial: 7.479 — ABNORMAL HIGH (ref 7.350–7.450)
pH, Arterial: 7.555 — ABNORMAL HIGH (ref 7.350–7.450)
pO2, Arterial: 458 mmHg — ABNORMAL HIGH (ref 83.0–108.0)
pO2, Arterial: 541 mmHg — ABNORMAL HIGH (ref 83.0–108.0)

## 2019-06-05 LAB — TYPE AND SCREEN
ABO/RH(D): O POS
Antibody Screen: NEGATIVE
Unit division: 0
Unit division: 0
Unit division: 0
Unit division: 0
Unit division: 0
Unit division: 0
Unit division: 0
Unit division: 0
Unit division: 0
Unit division: 0
Unit division: 0

## 2019-06-05 LAB — PROTIME-INR
INR: 1.5 — ABNORMAL HIGH (ref 0.8–1.2)
INR: 1.9 — ABNORMAL HIGH (ref 0.8–1.2)
Prothrombin Time: 17.6 seconds — ABNORMAL HIGH (ref 11.4–15.2)
Prothrombin Time: 21.7 seconds — ABNORMAL HIGH (ref 11.4–15.2)

## 2019-06-05 LAB — BPAM RBC
Blood Product Expiration Date: 202012062359
Blood Product Expiration Date: 202012082359
Blood Product Expiration Date: 202012092359
Blood Product Expiration Date: 202012092359
Blood Product Expiration Date: 202012092359
Blood Product Expiration Date: 202012112359
Blood Product Expiration Date: 202012112359
Blood Product Expiration Date: 202012122359
Blood Product Expiration Date: 202012122359
Blood Product Expiration Date: 202012122359
Blood Product Expiration Date: 202012122359
ISSUE DATE / TIME: 202011070433
ISSUE DATE / TIME: 202011070759
ISSUE DATE / TIME: 202011070759
ISSUE DATE / TIME: 202011081733
ISSUE DATE / TIME: 202011091232
ISSUE DATE / TIME: 202011091232
ISSUE DATE / TIME: 202011091856
ISSUE DATE / TIME: 202011100339
ISSUE DATE / TIME: 202011100339
Unit Type and Rh: 5100
Unit Type and Rh: 5100
Unit Type and Rh: 5100
Unit Type and Rh: 5100
Unit Type and Rh: 5100
Unit Type and Rh: 5100
Unit Type and Rh: 5100
Unit Type and Rh: 5100
Unit Type and Rh: 5100
Unit Type and Rh: 5100
Unit Type and Rh: 5100

## 2019-06-05 LAB — PLATELET COUNT: Platelets: 18 10*3/uL — CL (ref 150–400)

## 2019-06-05 LAB — GLUCOSE, CAPILLARY
Glucose-Capillary: 100 mg/dL — ABNORMAL HIGH (ref 70–99)
Glucose-Capillary: 106 mg/dL — ABNORMAL HIGH (ref 70–99)
Glucose-Capillary: 109 mg/dL — ABNORMAL HIGH (ref 70–99)
Glucose-Capillary: 118 mg/dL — ABNORMAL HIGH (ref 70–99)
Glucose-Capillary: 123 mg/dL — ABNORMAL HIGH (ref 70–99)
Glucose-Capillary: 79 mg/dL (ref 70–99)
Glucose-Capillary: 84 mg/dL (ref 70–99)
Glucose-Capillary: 92 mg/dL (ref 70–99)

## 2019-06-05 LAB — PREPARE PLATELET PHERESIS
Unit division: 0
Unit division: 0

## 2019-06-05 LAB — CBC
HCT: 22.1 % — ABNORMAL LOW (ref 39.0–52.0)
Hemoglobin: 7.6 g/dL — ABNORMAL LOW (ref 13.0–17.0)
MCH: 29.8 pg (ref 26.0–34.0)
MCHC: 34.4 g/dL (ref 30.0–36.0)
MCV: 86.7 fL (ref 80.0–100.0)
Platelets: 40 10*3/uL — ABNORMAL LOW (ref 150–400)
RBC: 2.55 MIL/uL — ABNORMAL LOW (ref 4.22–5.81)
RDW: 16.7 % — ABNORMAL HIGH (ref 11.5–15.5)
WBC: 6.8 10*3/uL (ref 4.0–10.5)
nRBC: 0.3 % — ABNORMAL HIGH (ref 0.0–0.2)

## 2019-06-05 LAB — CULTURE, BAL-QUANTITATIVE W GRAM STAIN: Culture: 80000 — AB

## 2019-06-05 LAB — COMPREHENSIVE METABOLIC PANEL
ALT: 19 U/L (ref 0–44)
ALT: 24 U/L (ref 0–44)
AST: 29 U/L (ref 15–41)
AST: 95 U/L — ABNORMAL HIGH (ref 15–41)
Albumin: 2.5 g/dL — ABNORMAL LOW (ref 3.5–5.0)
Albumin: 2.7 g/dL — ABNORMAL LOW (ref 3.5–5.0)
Alkaline Phosphatase: 71 U/L (ref 38–126)
Alkaline Phosphatase: 82 U/L (ref 38–126)
Anion gap: 10 (ref 5–15)
Anion gap: 13 (ref 5–15)
BUN: 45 mg/dL — ABNORMAL HIGH (ref 8–23)
BUN: 52 mg/dL — ABNORMAL HIGH (ref 8–23)
CO2: 23 mmol/L (ref 22–32)
CO2: 20 mmol/L — ABNORMAL LOW (ref 22–32)
Calcium: 7.3 mg/dL — ABNORMAL LOW (ref 8.9–10.3)
Calcium: 7.9 mg/dL — ABNORMAL LOW (ref 8.9–10.3)
Chloride: 105 mmol/L (ref 98–111)
Chloride: 107 mmol/L (ref 98–111)
Creatinine, Ser: 1.29 mg/dL — ABNORMAL HIGH (ref 0.61–1.24)
Creatinine, Ser: 1.37 mg/dL — ABNORMAL HIGH (ref 0.61–1.24)
GFR calc Af Amer: >60 mL/min (ref >60)
GFR calc Af Amer: >60 mL/min (ref >60)
GFR calc non Af Amer: 56 mL/min — ABNORMAL LOW (ref >60)
GFR calc non Af Amer: 52 mL/min — ABNORMAL LOW (ref >60)
Glucose, Bld: 104 mg/dL — ABNORMAL HIGH (ref 70–99)
Glucose, Bld: 107 mg/dL — ABNORMAL HIGH (ref 70–99)
Potassium: 4.9 mmol/L (ref 3.5–5.1)
Potassium: 5.6 mmol/L — ABNORMAL HIGH (ref 3.5–5.1)
Sodium: 138 mmol/L (ref 135–145)
Sodium: 140 mmol/L (ref 135–145)
Total Bilirubin: 2.4 mg/dL — ABNORMAL HIGH (ref 0.3–1.2)
Total Bilirubin: 12.3 mg/dL — ABNORMAL HIGH (ref 0.3–1.2)
Total Protein: 4.5 g/dL — ABNORMAL LOW (ref 6.5–8.1)
Total Protein: 4.6 g/dL — ABNORMAL LOW (ref 6.5–8.1)

## 2019-06-05 LAB — BPAM PLATELET PHERESIS
Blood Product Expiration Date: 202011110336
Blood Product Expiration Date: 202011112359
ISSUE DATE / TIME: 202011100337
ISSUE DATE / TIME: 202011100534
Unit Type and Rh: 5100
Unit Type and Rh: 6200

## 2019-06-05 LAB — HEMOGLOBIN AND HEMATOCRIT, BLOOD
HCT: 22.9 % — ABNORMAL LOW (ref 39.0–52.0)
Hemoglobin: 8.1 g/dL — ABNORMAL LOW (ref 13.0–17.0)

## 2019-06-05 LAB — FIBRINOGEN
Fibrinogen: 353 mg/dL (ref 210–475)
Fibrinogen: 172 mg/dL — ABNORMAL LOW (ref 210–475)
Fibrinogen: 286 mg/dL (ref 210–475)

## 2019-06-05 LAB — ECHO INTRAOPERATIVE TEE
Height: 67 in
Weight: 2553.81 oz

## 2019-06-05 LAB — PHOSPHORUS: Phosphorus: 2.7 mg/dL (ref 2.5–4.6)

## 2019-06-05 LAB — LACTATE DEHYDROGENASE: LDH: 946 U/L — ABNORMAL HIGH (ref 98–192)

## 2019-06-05 LAB — APTT: aPTT: 36 seconds (ref 24–36)

## 2019-06-05 LAB — PREPARE RBC (CROSSMATCH)

## 2019-06-05 LAB — LACTIC ACID, PLASMA: Lactic Acid, Venous: 1.4 mmol/L (ref 0.5–1.9)

## 2019-06-05 SURGERY — CORONARY ARTERY BYPASS GRAFTING (CABG)
Anesthesia: General | Site: Chest

## 2019-06-05 MED ORDER — ROCURONIUM BROMIDE 100 MG/10ML IV SOLN: INTRAVENOUS | Status: DC | PRN

## 2019-06-05 MED ORDER — FENTANYL CITRATE (PF) 250 MCG/5ML IJ SOLN
INTRAMUSCULAR | Status: AC
  Filled 2019-06-05: qty 25

## 2019-06-05 MED ORDER — LACTATED RINGERS IV SOLN
500.0000 mL | Freq: Once | INTRAVENOUS | Status: AC | PRN
  Administered 2019-06-05: 500 mL via INTRAVENOUS

## 2019-06-05 MED ORDER — SODIUM CHLORIDE (PF) 0.9 % IJ SOLN
OROMUCOSAL | Status: DC | PRN
  Administered 2019-06-05 (×3): 4 mL via TOPICAL

## 2019-06-05 MED ORDER — VASOPRESSIN 20 UNIT/ML IV SOLN
INTRAVENOUS | Status: DC | PRN
  Administered 2019-06-05 (×2): 1 [IU] via INTRAVENOUS
  Administered 2019-06-05: 2 [IU] via INTRAVENOUS
  Administered 2019-06-05 (×2): 1 [IU] via INTRAVENOUS
  Administered 2019-06-05: 2 [IU] via INTRAVENOUS

## 2019-06-05 MED ORDER — HEPARIN BOLUS VIA INFUSION
2000.0000 [IU] | Freq: Once | INTRAVENOUS | Status: AC
  Administered 2019-06-05: 2000 [IU] via INTRAVENOUS
  Filled 2019-06-05: qty 2000

## 2019-06-05 MED ORDER — BUPIVACAINE HCL (PF) 0.5 % IJ SOLN
INTRAMUSCULAR | Status: AC
  Filled 2019-06-05: qty 30

## 2019-06-05 MED ORDER — EPINEPHRINE PF 1 MG/ML IJ SOLN
0.0000 ug/min | INTRAVENOUS | Status: DC
  Administered 2019-06-05: 5 ug/min via INTRAVENOUS
  Filled 2019-06-05 (×2): qty 4

## 2019-06-05 MED ORDER — MILRINONE LACTATE IN DEXTROSE 20-5 MG/100ML-% IV SOLN
0.1250 ug/kg/min | INTRAVENOUS | Status: DC
  Administered 2019-06-05 – 2019-06-06 (×2): 0.375 ug/kg/min via INTRAVENOUS
  Filled 2019-06-05: qty 100

## 2019-06-05 MED ORDER — ATORVASTATIN CALCIUM 40 MG PO TABS: 40.0000 mg | ORAL_TABLET | Freq: Every day | ORAL | Status: DC

## 2019-06-05 MED ORDER — LACTATED RINGERS IV SOLN
INTRAVENOUS | Status: DC
  Administered 2019-06-05: 18:00:00 via INTRAVENOUS

## 2019-06-05 MED ORDER — ROCURONIUM BROMIDE 10 MG/ML (PF) SYRINGE
PREFILLED_SYRINGE | INTRAVENOUS | Status: DC | PRN
  Administered 2019-06-05: 40 mg via INTRAVENOUS
  Administered 2019-06-05: 30 mg via INTRAVENOUS
  Administered 2019-06-05: 70 mg via INTRAVENOUS
  Administered 2019-06-05: 20 mg via INTRAVENOUS
  Administered 2019-06-05 (×2): 30 mg via INTRAVENOUS
  Administered 2019-06-05: 60 mg via INTRAVENOUS
  Administered 2019-06-05: 20 mg via INTRAVENOUS

## 2019-06-05 MED ORDER — UMECLIDINIUM BROMIDE 62.5 MCG/INH IN AEPB
2.0000 | INHALATION_SPRAY | Freq: Every day | RESPIRATORY_TRACT | Status: DC
  Filled 2019-06-05: qty 7

## 2019-06-05 MED ORDER — DEXMEDETOMIDINE HCL IN NACL 400 MCG/100ML IV SOLN
0.0000 ug/kg/h | INTRAVENOUS | Status: DC
  Administered 2019-06-05 – 2019-06-07 (×6): 0.7 ug/kg/h via INTRAVENOUS
  Administered 2019-06-08: 0.5 ug/kg/h via INTRAVENOUS
  Filled 2019-06-05 (×4): qty 100
  Filled 2019-06-05: qty 200
  Filled 2019-06-05: qty 100

## 2019-06-05 MED ORDER — BISACODYL 10 MG RE SUPP: 10.0000 mg | Freq: Every day | RECTAL | Status: DC

## 2019-06-05 MED ORDER — SODIUM CHLORIDE 0.9% IV SOLUTION: Freq: Once | INTRAVENOUS | Status: DC

## 2019-06-05 MED ORDER — STERILE WATER FOR INJECTION IJ SOLN
INTRAMUSCULAR | Status: DC | PRN
  Administered 2019-06-05: 1000 mL

## 2019-06-05 MED ORDER — SODIUM CHLORIDE 0.9 % IV SOLN: 250.0000 mL | INTRAVENOUS | Status: DC

## 2019-06-05 MED ORDER — BISACODYL 5 MG PO TBEC: 10.0000 mg | DELAYED_RELEASE_TABLET | Freq: Every day | ORAL | Status: DC

## 2019-06-05 MED ORDER — NITROGLYCERIN IN D5W 200-5 MCG/ML-% IV SOLN: 0.0000 ug/min | INTRAVENOUS | Status: DC

## 2019-06-05 MED ORDER — TRAMADOL HCL 50 MG PO TABS: 50.0000 mg | ORAL_TABLET | ORAL | Status: DC | PRN

## 2019-06-05 MED ORDER — INSULIN REGULAR(HUMAN) IN NACL 100-0.9 UT/100ML-% IV SOLN: INTRAVENOUS | Status: DC

## 2019-06-05 MED ORDER — ARTIFICIAL TEARS OPHTHALMIC OINT
TOPICAL_OINTMENT | OPHTHALMIC | Status: DC | PRN
  Administered 2019-06-05: 1 via OPHTHALMIC

## 2019-06-05 MED ORDER — CALCIUM CHLORIDE 10 % IV SOLN
INTRAVENOUS | Status: DC | PRN
  Administered 2019-06-05 (×2): 250 mg via INTRAVENOUS
  Administered 2019-06-05: 500 mg via INTRAVENOUS

## 2019-06-05 MED ORDER — STERILE WATER FOR INJECTION IJ SOLN
INTRAMUSCULAR | Status: AC
  Filled 2019-06-05: qty 30

## 2019-06-05 MED ORDER — MILRINONE LACTATE IN DEXTROSE 20-5 MG/100ML-% IV SOLN
INTRAVENOUS | Status: DC | PRN
  Administered 2019-06-05: 0.5 ug/kg/min via INTRAVENOUS

## 2019-06-05 MED ORDER — LACTATED RINGERS IV SOLN
INTRAVENOUS | Status: DC | PRN
  Administered 2019-06-05: 09:00:00 via INTRAVENOUS

## 2019-06-05 MED ORDER — TRANEXAMIC ACID 1000 MG/10ML IV SOLN
1.5000 mg/kg/h | INTRAVENOUS | Status: DC
  Filled 2019-06-05: qty 25

## 2019-06-05 MED ORDER — VASOPRESSIN 20 UNIT/ML IV SOLN
INTRAVENOUS | Status: DC | PRN
  Administered 2019-06-05: 0.4 [IU]/min via INTRAVENOUS

## 2019-06-05 MED ORDER — DOCUSATE SODIUM 100 MG PO CAPS: 200.0000 mg | ORAL_CAPSULE | Freq: Every day | ORAL | Status: DC

## 2019-06-05 MED ORDER — DOPAMINE-DEXTROSE 3.2-5 MG/ML-% IV SOLN
2.5000 ug/kg/min | INTRAVENOUS | Status: DC
  Administered 2019-06-05: 2.5 ug/kg/min via INTRAVENOUS

## 2019-06-05 MED ORDER — VANCOMYCIN HCL 1000 MG IV SOLR
INTRAVENOUS | Status: AC
  Filled 2019-06-05: qty 3000

## 2019-06-05 MED ORDER — MIDAZOLAM HCL (PF) 10 MG/2ML IJ SOLN
INTRAMUSCULAR | Status: AC
  Filled 2019-06-05: qty 2

## 2019-06-05 MED ORDER — SODIUM CHLORIDE 0.9 % IV SOLN
INTRAVENOUS | Status: DC | PRN
  Administered 2019-06-05 (×2): via INTRAVENOUS

## 2019-06-05 MED ORDER — ONDANSETRON HCL 4 MG/2ML IJ SOLN: 4.0000 mg | Freq: Four times a day (QID) | INTRAMUSCULAR | Status: DC | PRN

## 2019-06-05 MED ORDER — SODIUM CHLORIDE 0.9 % IV SOLN
INTRAVENOUS | Status: DC | PRN
  Administered 2019-06-05 (×2): via INTRAVENOUS

## 2019-06-05 MED ORDER — MAGNESIUM SULFATE 4 GM/100ML IV SOLN
4.0000 g | Freq: Once | INTRAVENOUS | Status: AC
  Administered 2019-06-05: 4 g via INTRAVENOUS
  Filled 2019-06-05: qty 100

## 2019-06-05 MED ORDER — SODIUM CHLORIDE 0.9% FLUSH
3.0000 mL | Freq: Two times a day (BID) | INTRAVENOUS | Status: DC
  Administered 2019-06-06 – 2019-06-08 (×3): 3 mL via INTRAVENOUS

## 2019-06-05 MED ORDER — METOPROLOL TARTRATE 5 MG/5ML IV SOLN: 2.5000 mg | INTRAVENOUS | Status: DC | PRN

## 2019-06-05 MED ORDER — LACTATED RINGERS IV SOLN
INTRAVENOUS | Status: DC
  Administered 2019-06-05: via INTRAVENOUS

## 2019-06-05 MED ORDER — ETOMIDATE 2 MG/ML IV SOLN
INTRAVENOUS | Status: AC
  Filled 2019-06-05: qty 10

## 2019-06-05 MED ORDER — EPINEPHRINE 1 MG/10ML IJ SOSY
PREFILLED_SYRINGE | INTRAMUSCULAR | Status: DC | PRN
  Administered 2019-06-05: 0.1 mg via INTRAVENOUS

## 2019-06-05 MED ORDER — VANCOMYCIN HCL 1000 MG IV SOLR
INTRAVENOUS | Status: DC | PRN
  Administered 2019-06-05: 1000 mL

## 2019-06-05 MED ORDER — EPHEDRINE 5 MG/ML INJ
INTRAVENOUS | Status: AC
  Filled 2019-06-05: qty 10

## 2019-06-05 MED ORDER — ROCURONIUM BROMIDE 10 MG/ML (PF) SYRINGE
PREFILLED_SYRINGE | INTRAVENOUS | Status: AC
  Filled 2019-06-05: qty 10

## 2019-06-05 MED ORDER — HEPARIN SODIUM (PORCINE) 1000 UNIT/ML IJ SOLN
INTRAMUSCULAR | Status: AC
  Filled 2019-06-05: qty 1

## 2019-06-05 MED ORDER — INSULIN REGULAR BOLUS VIA INFUSION
0.0000 [IU] | Freq: Three times a day (TID) | INTRAVENOUS | Status: DC
  Filled 2019-06-05: qty 10

## 2019-06-05 MED ORDER — INDOCYANINE GREEN 25 MG IV SOLR
INTRAVENOUS | Status: DC | PRN
  Administered 2019-06-05: 1.25 mg via INTRAVENOUS

## 2019-06-05 MED ORDER — PHENYLEPHRINE 40 MCG/ML (10ML) SYRINGE FOR IV PUSH (FOR BLOOD PRESSURE SUPPORT)
PREFILLED_SYRINGE | INTRAVENOUS | Status: AC
  Filled 2019-06-05: qty 10

## 2019-06-05 MED ORDER — FAMOTIDINE IN NACL 20-0.9 MG/50ML-% IV SOLN: 20.0000 mg | Freq: Two times a day (BID) | INTRAVENOUS | Status: AC

## 2019-06-05 MED ORDER — SODIUM CHLORIDE 0.9% IV SOLUTION
Freq: Once | INTRAVENOUS | Status: AC
  Administered 2019-06-05: 22:00:00 via INTRAVENOUS

## 2019-06-05 MED ORDER — PROTAMINE SULFATE 10 MG/ML IV SOLN
INTRAVENOUS | Status: DC | PRN
  Administered 2019-06-05: 30 mg via INTRAVENOUS
  Administered 2019-06-05: 20 mg via INTRAVENOUS
  Administered 2019-06-05: 10 mg via INTRAVENOUS
  Administered 2019-06-05 (×2): 20 mg via INTRAVENOUS
  Administered 2019-06-05: 10 mg via INTRAVENOUS
  Administered 2019-06-05 (×2): 20 mg via INTRAVENOUS

## 2019-06-05 MED ORDER — SODIUM CHLORIDE (PF) 0.9 % IJ SOLN
INTRAMUSCULAR | Status: AC
  Filled 2019-06-05: qty 30

## 2019-06-05 MED ORDER — ACETAMINOPHEN 650 MG RE SUPP: 650.0000 mg | Freq: Once | RECTAL | Status: AC

## 2019-06-05 MED ORDER — EPINEPHRINE HCL 5 MG/250ML IV SOLN IN NS
INTRAVENOUS | Status: DC | PRN
  Administered 2019-06-05: 2 ug/min via INTRAVENOUS

## 2019-06-05 MED ORDER — VITAL 1.5 CAL PO LIQD
1000.0000 mL | ORAL | Status: DC
  Filled 2019-06-05 (×4): qty 1000

## 2019-06-05 MED ORDER — MIDAZOLAM HCL 5 MG/5ML IJ SOLN
INTRAMUSCULAR | Status: DC | PRN
  Administered 2019-06-05: 3 mg via INTRAVENOUS
  Administered 2019-06-05 (×3): 1 mg via INTRAVENOUS
  Administered 2019-06-05: 2 mg via INTRAVENOUS

## 2019-06-05 MED ORDER — FENTANYL CITRATE (PF) 250 MCG/5ML IJ SOLN
INTRAMUSCULAR | Status: DC | PRN
  Administered 2019-06-05: 50 ug via INTRAVENOUS
  Administered 2019-06-05: 100 ug via INTRAVENOUS
  Administered 2019-06-05: 50 ug via INTRAVENOUS
  Administered 2019-06-05: 100 ug via INTRAVENOUS
  Administered 2019-06-05: 50 ug via INTRAVENOUS
  Administered 2019-06-05: 100 ug via INTRAVENOUS

## 2019-06-05 MED ORDER — VANCOMYCIN HCL 1000 MG IV SOLR
INTRAVENOUS | Status: DC | PRN
  Administered 2019-06-05 (×3): 1000 mg

## 2019-06-05 MED ORDER — SODIUM CHLORIDE 0.9 % IV SOLN: INTRAVENOUS | Status: DC

## 2019-06-05 MED ORDER — ALBUMIN HUMAN 5 % IV SOLN
250.0000 mL | INTRAVENOUS | Status: AC | PRN
  Administered 2019-06-05 – 2019-06-06 (×5): 12.5 g via INTRAVENOUS

## 2019-06-05 MED ORDER — 0.9 % SODIUM CHLORIDE (POUR BTL) OPTIME
TOPICAL | Status: DC | PRN
  Administered 2019-06-05: 1000 mL
  Administered 2019-06-05: 4000 mL

## 2019-06-05 MED ORDER — PRO-STAT SUGAR FREE PO LIQD
30.0000 mL | Freq: Four times a day (QID) | ORAL | Status: DC
  Administered 2019-06-06 – 2019-06-07 (×3): 30 mL
  Filled 2019-06-05 (×4): qty 30

## 2019-06-05 MED ORDER — SODIUM CHLORIDE (PF) 0.9 % IJ SOLN
INTRAMUSCULAR | Status: AC
  Filled 2019-06-05: qty 10

## 2019-06-05 MED ORDER — POTASSIUM CHLORIDE 10 MEQ/50ML IV SOLN: 10.0000 meq | INTRAVENOUS | Status: AC

## 2019-06-05 MED ORDER — HEMOSTATIC AGENTS (NO CHARGE) OPTIME
TOPICAL | Status: DC | PRN
  Administered 2019-06-05 (×2): 1 via TOPICAL

## 2019-06-05 MED ORDER — OXYCODONE HCL 5 MG PO TABS: 5.0000 mg | ORAL_TABLET | ORAL | Status: DC | PRN

## 2019-06-05 MED ORDER — NON FORMULARY
Status: DC | PRN
  Administered 2019-06-05: 30 mL via INTRAMUSCULAR

## 2019-06-05 MED ORDER — PROPOFOL 10 MG/ML IV BOLUS
INTRAVENOUS | Status: AC
  Filled 2019-06-05: qty 20

## 2019-06-05 MED ORDER — HEPARIN SODIUM (PORCINE) 1000 UNIT/ML IJ SOLN
INTRAMUSCULAR | Status: DC | PRN
  Administered 2019-06-05: 15000 [IU] via INTRAVENOUS

## 2019-06-05 MED ORDER — ARTIFICIAL TEARS OPHTHALMIC OINT
TOPICAL_OINTMENT | OPHTHALMIC | Status: AC
  Filled 2019-06-05: qty 3.5

## 2019-06-05 MED ORDER — SODIUM CHLORIDE 0.45 % IV SOLN: INTRAVENOUS | Status: DC | PRN

## 2019-06-05 MED ORDER — PHENYLEPHRINE HCL-NACL 20-0.9 MG/250ML-% IV SOLN
0.0000 ug/min | INTRAVENOUS | Status: DC
  Filled 2019-06-05: qty 250

## 2019-06-05 MED ORDER — SODIUM CHLORIDE 0.9% FLUSH: 3.0000 mL | INTRAVENOUS | Status: DC | PRN

## 2019-06-05 MED ORDER — INSULIN ASPART 100 UNIT/ML ~~LOC~~ SOLN
0.0000 [IU] | SUBCUTANEOUS | Status: DC
  Administered 2019-06-06: 2 [IU] via SUBCUTANEOUS

## 2019-06-05 MED ORDER — SODIUM BICARBONATE 4.2 % IV SOLN
INTRAVENOUS | Status: DC | PRN
  Administered 2019-06-05: 50 meq via INTRAVENOUS

## 2019-06-05 MED ORDER — PLASMA-LYTE 148 IV SOLN
INTRAVENOUS | Status: DC | PRN
  Administered 2019-06-05: 500 mL

## 2019-06-05 MED ORDER — PROTAMINE SULFATE 10 MG/ML IV SOLN
INTRAVENOUS | Status: AC
  Filled 2019-06-05: qty 25

## 2019-06-05 MED ORDER — LACTATED RINGERS IV SOLN
INTRAVENOUS | Status: DC | PRN
  Administered 2019-06-05: 10:00:00 via INTRAVENOUS

## 2019-06-05 MED ORDER — VASOPRESSIN 20 UNIT/ML IV SOLN
0.0400 [IU]/min | INTRAVENOUS | Status: DC
  Administered 2019-06-06: 0.04 [IU]/min via INTRAVENOUS
  Filled 2019-06-05 (×3): qty 2

## 2019-06-05 MED ORDER — ACETAMINOPHEN 160 MG/5ML PO SOLN
650.0000 mg | Freq: Once | ORAL | Status: AC
  Administered 2019-06-06: 650 mg

## 2019-06-05 MED ORDER — ACETAMINOPHEN 500 MG PO TABS: 1000.0000 mg | ORAL_TABLET | Freq: Four times a day (QID) | ORAL | Status: DC

## 2019-06-05 MED ORDER — MILRINONE LACTATE IN DEXTROSE 20-5 MG/100ML-% IV SOLN
0.2500 ug/kg/min | INTRAVENOUS | Status: DC
  Administered 2019-06-05: 0.375 ug/kg/min via INTRAVENOUS
  Filled 2019-06-05: qty 100

## 2019-06-05 MED ORDER — FENTANYL 2500MCG IN NS 250ML (10MCG/ML) PREMIX INFUSION
200.0000 ug/h | INTRAVENOUS | Status: DC
  Administered 2019-06-06: 50 ug/h via INTRAVENOUS
  Administered 2019-06-07: 200 ug/h via INTRAVENOUS
  Administered 2019-06-07 – 2019-06-08 (×2): 300 ug/h via INTRAVENOUS
  Administered 2019-06-08: 400 ug/h via INTRAVENOUS
  Filled 2019-06-05 (×5): qty 250

## 2019-06-05 MED ORDER — MORPHINE SULFATE (PF) 2 MG/ML IV SOLN: 1.0000 mg | INTRAVENOUS | Status: DC | PRN

## 2019-06-05 MED ORDER — LACTATED RINGERS IV SOLN
INTRAVENOUS | Status: DC | PRN
  Administered 2019-06-05 (×2): via INTRAVENOUS

## 2019-06-05 MED ORDER — ACETAMINOPHEN 160 MG/5ML PO SOLN
1000.0000 mg | Freq: Four times a day (QID) | ORAL | Status: DC
  Administered 2019-06-06 – 2019-06-07 (×3): 1000 mg
  Filled 2019-06-05 (×6): qty 40.6

## 2019-06-05 SURGICAL SUPPLY — 118 items
ADAPTER CARDIO PERF ANTE/RETRO (ADAPTER) ×8 IMPLANT
ADAPTER MULTI PERFUSION 15 (ADAPTER) ×4 IMPLANT
ADH SKN CLS APL DERMABOND .7 (GAUZE/BANDAGES/DRESSINGS) ×6
ADPR PRFSN 84XANTGRD RTRGD (ADAPTER) ×4
APL SKNCLS STERI-STRIP NONHPOA (GAUZE/BANDAGES/DRESSINGS) ×6
ATRICLIP EXCLUSION 35 STD HAND (Clip) ×4 IMPLANT
BAG DECANTER FOR FLEXI CONT (MISCELLANEOUS) ×4 IMPLANT
BANDAGE ESMARK 6X9 LF (GAUZE/BANDAGES/DRESSINGS) ×3 IMPLANT
BASKET HEART (ORDER IN 25'S) (MISCELLANEOUS) ×1
BASKET HEART (ORDER IN 25S) (MISCELLANEOUS) ×3 IMPLANT
BENZOIN TINCTURE PRP APPL 2/3 (GAUZE/BANDAGES/DRESSINGS) ×8 IMPLANT
BLADE CLIPPER SURG (BLADE) ×4 IMPLANT
BLADE NEEDLE 3 SS STRL (BLADE) ×4 IMPLANT
BLADE STERNUM SYSTEM 6 (BLADE) ×4 IMPLANT
BLADE SURG 11 STRL SS (BLADE) ×4 IMPLANT
BNDG CMPR 9X6 STRL LF SNTH (GAUZE/BANDAGES/DRESSINGS) ×2
BNDG ELASTIC 4X5.8 VLCR STR LF (GAUZE/BANDAGES/DRESSINGS) ×4 IMPLANT
BNDG ELASTIC 6X5.8 VLCR STR LF (GAUZE/BANDAGES/DRESSINGS) ×4 IMPLANT
BNDG ESMARK 6X9 LF (GAUZE/BANDAGES/DRESSINGS) ×4
BNDG GAUZE ELAST 4 BULKY (GAUZE/BANDAGES/DRESSINGS) ×4 IMPLANT
CANISTER SUCT 3000ML PPV (MISCELLANEOUS) ×4 IMPLANT
CANNULA VESSEL 3MM BLUNT TIP (CANNULA) ×8 IMPLANT
CATH CPB KIT HENDRICKSON (MISCELLANEOUS) IMPLANT
CATH RETROPLEGIA CORONARY 14FR (CATHETERS) ×4 IMPLANT
CATH ROBINSON RED A/P 18FR (CATHETERS) ×12 IMPLANT
CLAMP OLL ABLATION (MISCELLANEOUS) ×4 IMPLANT
CLIP RETRACTION 3.0MM CORONARY (MISCELLANEOUS) ×4 IMPLANT
CONN 3/8X1/2 ST GISH (MISCELLANEOUS) ×4 IMPLANT
CONT SPEC 4OZ CLIKSEAL STRL BL (MISCELLANEOUS) ×4 IMPLANT
COVER PROBE W GEL 5X96 (DRAPES) ×4 IMPLANT
COVER WAND RF STERILE (DRAPES) IMPLANT
DERMABOND ADVANCED (GAUZE/BANDAGES/DRESSINGS) ×2
DERMABOND ADVANCED .7 DNX12 (GAUZE/BANDAGES/DRESSINGS) ×6 IMPLANT
DRAIN CHANNEL 28F RND 3/8 FF (WOUND CARE) ×16 IMPLANT
DRAPE CARDIOVASCULAR INCISE (DRAPES) ×4
DRAPE INCISE IOBAN 66X45 STRL (DRAPES) ×4 IMPLANT
DRAPE SLUSH/WARMER DISC (DRAPES) ×4 IMPLANT
DRAPE SRG 135X102X78XABS (DRAPES) ×3 IMPLANT
DRSG AQUACEL AG ADV 3.5X14 (GAUZE/BANDAGES/DRESSINGS) ×4 IMPLANT
ELECT CAUTERY BLADE 6.4 (BLADE) ×4 IMPLANT
ELECT REM PT RETURN 9FT ADLT (ELECTROSURGICAL) ×8
ELECTRODE REM PT RTRN 9FT ADLT (ELECTROSURGICAL) ×6 IMPLANT
FELT TEFLON 1X6 (MISCELLANEOUS) ×4 IMPLANT
GAUZE SPONGE 4X4 12PLY STRL (GAUZE/BANDAGES/DRESSINGS) ×4 IMPLANT
GAUZE XEROFORM 1X8 LF (GAUZE/BANDAGES/DRESSINGS) ×4 IMPLANT
GLOVE NEODERM STRL 7.5 LF PF (GLOVE) ×9 IMPLANT
GLOVE SURG NEODERM 7.5  LF PF (GLOVE) ×3
GOWN STRL REUS W/ TWL LRG LVL3 (GOWN DISPOSABLE) ×24 IMPLANT
GOWN STRL REUS W/TWL LRG LVL3 (GOWN DISPOSABLE) ×32
HANDPIECE INTERPULSE COAX TIP (DISPOSABLE) ×4
HEMOSTAT POWDER SURGIFOAM 1G (HEMOSTASIS) ×12 IMPLANT
HEMOSTAT SURGICEL 2X14 (HEMOSTASIS) ×4 IMPLANT
INSERT FOGARTY XLG (MISCELLANEOUS) ×4 IMPLANT
IV NS IRRIG 3000ML ARTHROMATIC (IV SOLUTION) ×4 IMPLANT
KIT BASIN OR (CUSTOM PROCEDURE TRAY) ×4 IMPLANT
KIT DRAINAGE VACCUM ASSIST (KITS) ×4 IMPLANT
KIT REMOVER STAPLE SKIN (MISCELLANEOUS) ×4 IMPLANT
KIT SUCTION CATH 14FR (SUCTIONS) ×4 IMPLANT
KIT SUT CK MINI COMBO 4X17 (Prosthesis & Implant Heart) ×4 IMPLANT
KIT TURNOVER KIT B (KITS) ×4 IMPLANT
KIT VASOVIEW HEMOPRO 2 VH 4000 (KITS) ×4 IMPLANT
LEAD PACING MYOCARDI (MISCELLANEOUS) ×8 IMPLANT
LINE VENT (MISCELLANEOUS) ×4 IMPLANT
MARKER GRAFT CORONARY BYPASS (MISCELLANEOUS) ×8 IMPLANT
NEEDLE 18GX1X1/2 (RX/OR ONLY) (NEEDLE) ×4 IMPLANT
NS IRRIG 1000ML POUR BTL (IV SOLUTION) ×20 IMPLANT
PACK E OPEN HEART (SUTURE) ×4 IMPLANT
PACK OPEN HEART (CUSTOM PROCEDURE TRAY) ×4 IMPLANT
PACK SPY-PHI (KITS) IMPLANT
PAD ELECT DEFIB RADIOL ZOLL (MISCELLANEOUS) ×4 IMPLANT
PENCIL BUTTON HOLSTER BLD 10FT (ELECTRODE) ×4 IMPLANT
POSITIONER HEAD DONUT 9IN (MISCELLANEOUS) ×4 IMPLANT
POWDER SURGICEL 3.0 GRAM (HEMOSTASIS) ×8 IMPLANT
SEALANT SURG COSEAL 8ML (VASCULAR PRODUCTS) ×4 IMPLANT
SET CANNULATION TOURNIQUET (MISCELLANEOUS) ×4 IMPLANT
SET CARDIOPLEGIA MPS 5001102 (MISCELLANEOUS) ×4 IMPLANT
SET HNDPC FAN SPRY TIP SCT (DISPOSABLE) ×3 IMPLANT
SPONGE LAP 18X18 RF (DISPOSABLE) ×8 IMPLANT
STAPLER VISISTAT 35W (STAPLE) ×4 IMPLANT
SUCTION CARDIAC 10F ×4 IMPLANT
SUT BONE WAX W31G (SUTURE) ×4 IMPLANT
SUT ETHIBOND X763 2 0 SH 1 (SUTURE) ×16 IMPLANT
SUT MNCRL AB 3-0 PS2 18 (SUTURE) ×8 IMPLANT
SUT MNCRL AB 4-0 PS2 18 (SUTURE) ×4 IMPLANT
SUT PDS AB 1 CTX 36 (SUTURE) ×8 IMPLANT
SUT PROLENE 2 0 MH 48 (SUTURE) ×8 IMPLANT
SUT PROLENE 3 0 SH DA (SUTURE) IMPLANT
SUT PROLENE 5 0 C 1 36 (SUTURE) IMPLANT
SUT PROLENE 6 0 C 1 30 (SUTURE) ×8 IMPLANT
SUT PROLENE 7 0 BV1 MDA (SUTURE) ×4 IMPLANT
SUT PROLENE 8 0 BV175 6 (SUTURE) IMPLANT
SUT PROLENE BLUE 7 0 (SUTURE) ×4 IMPLANT
SUT SILK  1 MH (SUTURE) ×1
SUT SILK 1 MH (SUTURE) ×3 IMPLANT
SUT SILK 2 0 SH CR/8 (SUTURE) IMPLANT
SUT SILK 3 0 SH CR/8 (SUTURE) IMPLANT
SUT STEEL 6MS V (SUTURE) IMPLANT
SUT STEEL SZ 6 DBL 3X14 BALL (SUTURE) IMPLANT
SUT VIC AB 2-0 CT1 27 (SUTURE) ×3
SUT VIC AB 2-0 CT1 TAPERPNT 27 (SUTURE) ×3 IMPLANT
SUT VIC AB 2-0 CTX 27 (SUTURE) IMPLANT
SUT VIC AB 2-0 UR6 27 (SUTURE) ×8 IMPLANT
SUT VIC AB 3-0 X1 27 (SUTURE) IMPLANT
SYR 10ML LL (SYRINGE) IMPLANT
SYR 30ML LL (SYRINGE) ×12 IMPLANT
SYR 3ML LL SCALE MARK (SYRINGE) ×8 IMPLANT
SYSTEM SAHARA CHEST DRAIN ATS (WOUND CARE) ×4 IMPLANT
TAPE CLOTH SURG 4X10 WHT LF (GAUZE/BANDAGES/DRESSINGS) ×4 IMPLANT
TAPE PAPER 2X10 WHT MICROPORE (GAUZE/BANDAGES/DRESSINGS) ×4 IMPLANT
TOWEL GREEN STERILE (TOWEL DISPOSABLE) ×4 IMPLANT
TOWEL GREEN STERILE FF (TOWEL DISPOSABLE) ×4 IMPLANT
TRAY FOLEY SLVR 16FR TEMP STAT (SET/KITS/TRAYS/PACK) ×4 IMPLANT
TUBE SUCT INTRACARD DLP 20F (MISCELLANEOUS) ×4 IMPLANT
TUBING LAP HI FLOW INSUFFLATIO (TUBING) ×4 IMPLANT
UNDERPAD 30X30 (UNDERPADS AND DIAPERS) ×4 IMPLANT
VALVE SYSTEM EDWS INTUITY 21A (Prosthesis & Implant Heart) ×4 IMPLANT
WATER STERILE IRR 1000ML POUR (IV SOLUTION) ×8 IMPLANT
WATER STERILE IRR 1000ML UROMA (IV SOLUTION) IMPLANT

## 2019-06-05 NOTE — Transfer of Care (Signed)
Immediate Anesthesia Transfer of Care Note  Patient: Raymond Chavez  Procedure(s) Performed: CORONARY ARTERY BYPASS GRAFTING (CABG) x 4, ON PUMP, USING LEFT INTERNAL MAMMARY ARTERY AND RIGHT GREATER SAPHENOUS VEIN HARVESTED ENDOSCOPICALLY (N/A Chest) AORTIC VALVE REPLACEMENT (AVR) USING EDWARDS INTUITY 21MM AORTIC VALVE (N/A ) TRANSESOPHAGEAL ECHOCARDIOGRAM (TEE) (N/A ) Clipping Of Atrial Appendage USING 35MM ATRIAL CLIP Video Bronchoscopy REMOVAL OF  Ecmo (Extracorporeal Membrane Oxygenation)  Patient Location: ICU  Anesthesia Type:General  Level of Consciousness: sedated and Patient remains intubated per anesthesia plan  Airway & Oxygen Therapy: Patient remains intubated per anesthesia plan  Post-op Assessment: Report given to RN and Post -op Vital signs reviewed and stable  Post vital signs: Reviewed and stable  Last Vitals:  Vitals Value Taken Time  BP    Temp 37.2 C 06/19/2019 1740  Pulse 105 06/24/2019 1728  Resp 18 06/17/2019 1740  SpO2 90 % 06/10/2019 1740  Vitals shown include unvalidated device data.  Last Pain:  Vitals:   06/10/2019 0400  TempSrc: Oral  PainSc: 0-No pain         Complications: No apparent anesthesia complications   Transported from OR on vent/nitric with respiratory therapy managing vent.  Upon arrival to ICU, recruitment maneuvers performed by RRT and saturation improved to 92%.  Norepi titrated for hypotension. Report to RN at bedside.

## 2019-06-05 NOTE — Progress Notes (Signed)
Patient had a drop in flow from 3.7-3.8 to 3.3-3.4 with more negative Pven pressures causing ecmo circuit to chug. Also the lvent experience a drop in flow to 0.9. One 250 of albumin was given for chugging and his flows have improved back up to 3.6-3.7.

## 2019-06-05 NOTE — Anesthesia Procedure Notes (Signed)
Central Venous Catheter Insertion Performed by: Oleta Mouse, MD, anesthesiologist Start/End11/13/2020 5:00 PM, 06/07/2019 5:19 PM Patient location: OR. Preanesthetic checklist: patient identified, IV checked, site marked, risks and benefits discussed, surgical consent, monitors and equipment checked, pre-op evaluation, timeout performed and anesthesia consent Lidocaine 1% used for infiltration and patient sedated Hand hygiene performed  and maximum sterile barriers used  Catheter size: 9 Fr Total catheter length 10. MAC introducer Procedure performed using ultrasound guided technique. Ultrasound Notes:anatomy identified, needle tip was noted to be adjacent to the nerve/plexus identified, no ultrasound evidence of intravascular and/or intraneural injection and image(s) printed for medical record Attempts: 1 Following insertion, line sutured, dressing applied and Biopatch. Post procedure assessment: blood return through all ports, free fluid flow and no air  Patient tolerated the procedure well with no immediate complications.

## 2019-06-05 NOTE — Anesthesia Preprocedure Evaluation (Addendum)
Anesthesia Evaluation  Patient identified by MRN, date of birth, ID band Patient unresponsive    Reviewed: Allergy & Precautions, NPO status , Patient's Chart, lab work & pertinent test results, Unable to perform ROS - Chart review only  Airway Mallampati: Intubated       Dental   Pulmonary Current Smoker and Patient abstained from smoking.,  intubated   + rhonchi        Cardiovascular hypertension, + CAD, + Past MI and +CHF  + Valvular Problems/Murmurs AI  Rhythm:Regular  On ECMO [facility]   Neuro/Psych negative neurological ROS  negative psych ROS   GI/Hepatic Neg liver ROS,   Endo/Other  negative endocrine ROS  Renal/GU DialysisRenal disease     Musculoskeletal   Abdominal   Peds  Hematology negative hematology ROS (+)   Anesthesia Other Findings   Reproductive/Obstetrics                           Anesthesia Physical Anesthesia Plan  ASA: V  Anesthesia Plan: General   Post-op Pain Management:    Induction: Intravenous  PONV Risk Score and Plan: 1 and Treatment may vary due to age or medical condition  Airway Management Planned: Oral ETT  Additional Equipment: Arterial line, CVP, PA Cath and TEE  Intra-op Plan:   Post-operative Plan: Post-operative intubation/ventilation  Informed Consent:     History available from chart only  Plan Discussed with: CRNA and Surgeon  Anesthesia Plan Comments:        Anesthesia Quick Evaluation

## 2019-06-05 NOTE — Anesthesia Procedure Notes (Signed)
Central Venous Catheter Insertion Performed by: Oleta Mouse, MD, anesthesiologist Start/End11/04/2019 5:00 PM, 06/14/2019 5:20 PM Patient location: OR. Preanesthetic checklist: patient identified, IV checked, site marked, risks and benefits discussed, surgical consent, monitors and equipment checked, pre-op evaluation, timeout performed and anesthesia consent Hand hygiene performed  and maximum sterile barriers used  PA cath was placed.Swan type:thermodilution Procedure performed without using ultrasound guided technique. Attempts: 1 Patient tolerated the procedure well with no immediate complications.

## 2019-06-05 NOTE — Progress Notes (Signed)
Patient ID: Raymond Chavez, male   DOB: 05/14/1951, 68 y.o.   MRN: [id number] ° ° ° °Advanced Heart Failure Rounding Note ° ° °Subjective:   ° °Events °- Cath 10/23 with sever 3v CAD and cardiogenic shock. Impella CP placed °- Underwent placement of Impella 5.0 on 10/24 with removal of R femoral Impella CP.  °- Extubated 11/1, CVVH held on 11/3 given  °- He had a possible aspiration event afternoon 11/3 then became progressively hypoxemic during the night.  He had to be re-intubated.  Bronchoscopy showed copious blood return suggesting hemoptysis. He developed PEA arrest during this time and underwent CPR  °-11/4 initiate ECMO.  Patient went to OR, arterial cannula in ascending aorta and venous cannula in right femoral vein. Impella remains in place at P2.  °- 11/7 ECMO circuit change for high dP °- 11/9 Impella removed, LV vent placed.  Moderate AI noted on TEE after Impella pulled.  Yeast in BAL, started on fluconazole.  ° °Remains intubated/sedated on ECMO, CVVHD.  ° °CVP 6 this morning on CVVH, pulling around 50 cc/hr net UF.  Flow 3.7 L/min this morning on 3200 rpm. Norepinephrine 2.  ° °Currently on NE 1, MAP 70s. PLTs 34 => 41. Hgb 6.7. CVP 6 this am. Pulling - 50 on CVVHD.  CXR improved after LV vent placed.  LDH (738 -> 946 -> 1218 -> 985 -> 946). ° °He is on vancomycin/meropenem/fluconazole.  ° °Developed AF. Was on amiodarone, now off.  He is in NSR with PACs.   ° °CVVHD pulling around - 50 cc/hr ° °ECMO parameters °Flow 3.7L 3200RPM. dP 15.   ° °Enterococcus faecalis, Candida albicans on BAL cultures.   ° ° °Objective:   °Weight Range: ° °Vital Signs:   °Temp:  [97.9 °F (36.6 °C)-99.1 °F (37.3 °C)] 98.7 °F (37.1 °C) (11/11 0400) °Pulse Rate:  [65-77] 65 (11/10 1924) °Resp:  [9-27] 10 (11/11 0400) °SpO2:  [93 %-100 %] 100 % (11/11 0400) °Arterial Line BP: (68-95)/(64-91) 78/72 (11/10 2312) °FiO2 (%):  [30 %] 30 % (11/11 0130) °Weight:  [72.4 kg] 72.4 kg (11/11 0400) °Last BM Date: 06/04/19 ° °Weight  change: °Filed Weights  ° 06/07/2019 0430 06/04/19 0432 06/17/2019 0400  °Weight: 72.1 kg 72.3 kg 72.4 kg  ° ° °Intake/Output:  ° °Intake/Output Summary (Last 24 hours) at 06/04/2019 0731 °Last data filed at 06/16/2019 0700 °Gross per 24 hour  °Intake 3920.59 ml  °Output 4632 ml  °Net -711.41 ml  °  ° °Physical Exam: ° °General: Intubated/sedated.  °Neck: No JVD, no thyromegaly or thyroid nodule.  °Lungs: Clear to auscultation bilaterally with normal respiratory effort. °CV: Nondisplaced PMI.  Heart regular S1/S2, no S3/S4, no murmur.  Trace ankle edema. °Abdomen: Soft, nontender, no hepatosplenomegaly, no distention.  °Skin: Intact without lesions or rashes.  °Neurologic: Sedated °Psych: Normal affect. °Extremities: No clubbing or cyanosis.  °HEENT: Normal.  ° °Telemetry: NSR 70s with PACs, atrial fibrillation last night (personally reviewed) ° °Labs: °Basic Metabolic Panel: °Recent Labs  °Lab 05/30/19 °1626  05/31/19 °0308  05/31/19 °1618  06/01/19 °0316  06/01/19 °1555  06/02/19 °0303  06/02/2019 °0407  06/15/2019 °1013  05/28/2019 °1653  06/04/19 °0217  06/04/19 °1313  06/04/19 °1640 06/04/19 °1847 06/02/2019 °0015 06/24/2019 °0433 06/02/2019 °0606  °NA 138   < > 137   < > 136   < > 137   < > 140   < > 138   < > 137   < >  --    < >   139   < > 136   < >  --    < > 137 139 139 138 138  °K 4.7   < > 4.7   < > 4.6   < > 4.6   < > 4.8   < > 4.7   < > 4.7   < >  --    < > 5.2*   < > 5.0   < >  --    < > 5.0 5.0 5.0 4.9 4.7  °CL 106  --  107  --  103   < > 105  --  107  --  105   < > 104  --   --   --  107  --  104  --   --   --  107  --   --  105  --   °CO2 25  --  24  --  23   < > 24  --  24  --  25   < > 23  --   --   --  23  --  23  --   --   --  22  --   --  23  --   °GLUCOSE 152*  --  184*  --  172*   < > 210*  --  176*  --  208*   < > 178*  --   --   --  120*  --  150*  --   --   --  167*  --   --  104*  --   °BUN 37*  --  33*  --  31*   < > 32*  --  30*  --  31*   < > 38*  --   --   --  48*  --  45*  --   --   --  46*  --   --   45*  --   °CREATININE 1.69*  --  1.45*  --  1.43*   < > 1.35*  --  1.26*  --  1.29*   < > 1.31*  --   --   --  1.61*  --  1.39*  --   --   --  1.37*  --   --  1.29*  --   °CALCIUM 6.9*  --  6.8*  --  7.2*   < > 7.2*  --  7.1*  --  7.3*   < > 7.6*  --   --   --  6.9*  --  7.1*  --   --   --  7.1*  --   --  7.3*  --   °MG 2.5*  --  2.3  --  2.4  --  2.6*  --  2.4  --   --   --   --   --   --   --   --   --   --   --   --   --   --   --   --   --   --   °PHOS 4.5  --  3.6  --  3.4  --   --   --   --   --  3.1  --   --   --  2.9  --   --   --   --   --  4.3  --   --   --   --    2.7  --   ° < > = values in this interval not displayed.  ° ° °Liver Function Tests: °Recent Labs  °Lab 06/22/2019 °0407 06/23/2019 °1653 06/04/19 °0217 06/04/19 °1640 06/10/2019 °0433  °AST 47* 43* 35 35 29  °ALT 23 18 20 19 19  °ALKPHOS 85 61 65 75 71  °BILITOT 1.7* 1.6* 1.8* 1.8* 2.4*  °PROT 5.0* 3.8* 4.3* 4.4* 4.5*  °ALBUMIN 2.1* 1.7* 2.2* 2.4* 2.5*  ° °No results for input(s): LIPASE, AMYLASE in the last 168 hours. °No results for input(s): AMMONIA in the last 168 hours. ° °CBC: °Recent Labs  °Lab 06/10/2019 °2308  05/30/19 °0550  06/02/19 °1150  06/04/19 °0217  06/04/19 °0757  06/04/19 °1640 06/04/19 °1847 06/04/19 °2129 06/21/2019 °0015 06/16/2019 °0433 06/14/2019 °0606  °WBC 19.3*   < > 15.6*   < > 14.9*   < > 9.3  --  9.8  --  8.4  --  5.8  --  6.8  --   °NEUTROABS 15.6*  --  12.9*  --  13.6*  --   --   --   --   --   --   --   --   --   --   --   °HGB 11.0*   < > 9.0*   < > 8.1*   < > 6.9*   < > 8.3*   < > 8.5* 8.8* 7.1* 7.8* 7.6* 6.1*  °HCT 31.7*   < > 26.4*   < > 24.1*   < > 20.3*   < > 24.5*   < > 25.5* 26.0* 21.4* 23.0* 22.1* 18.0*  °MCV 88.5   < > 90.1   < > 93.1   < > 90.2  --  85.4  --  87.0  --  87.7  --  86.7  --   °PLT 46*   < > 97*   < > 32*   < > 41*  --  82*  --  55*  --  43*  --  40*  --   ° < > = values in this interval not displayed.  ° ° °Cardiac Enzymes: °No results for input(s): CKTOTAL, CKMB, CKMBINDEX, TROPONINI in the last 168  hours. ° °BNP: °BNP (last 3 results) °Recent Labs  °  05/04/2019 °1739 05/24/2019 °1234  °BNP 2,725.3* 3,370.0*  ° ° °ProBNP (last 3 results) °No results for input(s): PROBNP in the last 8760 hours. ° ° ° °Other results: ° °Imaging: °Ct Abdomen Pelvis Wo Contrast ° °Result Date: 06/04/2019 °CLINICAL DATA:  Aortic disease, exchange of Impella for ventricular vent. EXAM: CT CHEST, ABDOMEN, AND PELVIS without CONTRAST TECHNIQUE: Multidetector CT imaging of the chest, abdomen and pelvis was performed following the standard protocol without intravenous contrast CONTRAST:  None COMPARISON:  None. FINDINGS: CT CHEST FINDINGS Cardiovascular: Interval sternotomy and removal of Impella device with placement of ventricular ECMO component, tip in the left ventricular apex. Small amount of hematoma in the anterior mediastinum. Also with aortic cannula in place passing through upper margin of sternotomy. Surgical drains are in place in this location. Subxiphoid mediastinal drain as well. Endotracheal tube terminates approximately 1.4 cm above the carina. Material within the bronchi peripheral to the tube. Right IJ central venous catheter terminating distal superior vena cava along with a right sided venous access device, likely PICC. Left-sided venous catheter terminates in the brachiocephalic just short of the veno veno ECMO cannula. Mediastinum/Nodes: Signs of stranding in the anterior mediastinum and small anterior   mediastinal hematoma after sternotomy. Is Lungs/Pleura: Dense basilar consolidation bilaterally with patchy areas of airspace opacity. Musculoskeletal: Signs of sternotomy as described. Also with signs of right axillary dissection and surgical drain in this location. CT ABDOMEN PELVIS FINDINGS Hepatobiliary: Liver is normal with sludge in the gallbladder. Pancreas: Normal appearance of pancreas. Spleen: Normal in size without focal abnormality. Adrenals/Urinary Tract: Normal appearance of the adrenal glands. Moderate  size left renal cysts with renal sinus component measure approximately 2.9 x 4.3 cm. No signs of hydronephrosis. Stomach/Bowel: No signs of bowel obstruction or acute bowel process. Fluid-filled bowel with bowel management tube in place in the rectum. Vascular/Lymphatic: Right groin venous ECMO cannula in place terminating in the superior vena cava. Vascular findings in the chest as described. Dense atherosclerotic calcification of the abdominal aorta extending into branch vessels. Stranding about the left groin without organized hematoma may reflect previous vascular access. Reproductive: Prostate unremarkable by CT. Other: Ovoid iliacus hematoma moderately large 8.1 x 4.9 x 8.9 cm. No signs of free intraperitoneal air. No signs of ascites. Musculoskeletal: Fractures of fourth through seventh ribs on the right with mild displacement of the right seventh rib anteriorly Fractures of fourth through eighth ribs on the left with nondisplaced appearance but with a mildly displaced fracture of the left fourth costochondral elements. IMPRESSION: 1. Dense basilar consolidation but with patchy areas of airspace opacity suspicious for worsening of pneumonia superimposed on volume loss in this patient on ECMO. 2. Status post sternotomy and removal of Impella device with placement of ventricular ECMO component, tip in the left ventricular apex. 3. Small amount of hematoma in the anterior mediastinum after sternotomy. 4. Veno veno ECMO cannula tip at the brachiocephalic junction, ports of the catheter are present in SVC and upper right atrium. 5. Arterial cannula in the aorta as well. Anterior mediastinum with small hematoma and mediastinal and pericardial drain in place. 6. Signs of right axillary soft tissue dissection and surgical drain in place. 7. ET tube approximately 1.3-1.4 cm above the carina. 8. Moderately large intramuscular hematoma of the left iliacus muscle. 9. Signs of bilateral rib fractures. 10. Results with  respect to basilar consolidation, tube and line position and the presence of left iliacus hematoma were called by telephone at the time of interpretation on 06/04/2019 at 11:05 am to provider [facility] , who verbally acknowledged these results. 11. Additional findings of rib fractures were not discussed initially, will be called to the ordering clinician or representative by the Radiologist Assistant, and communication documented in the PACS or zVision Dashboard. Aortic Atherosclerosis (ICD10-I70.0). Electronically Signed   By: Zetta Bills M.D.   On: 06/04/2019 11:10   Dg Chest 1 View  Result Date: 06/04/2019 CLINICAL DATA:  Open heart surgery. EXAM: CHEST  1 VIEW COMPARISON:  Multiple recent chest films. The most recent is earlier from today at 9:33 a.m. FINDINGS: The endotracheal tube, right IJ catheter, right PICC line, right subclavian Impella device, left IJ catheter and left ventricular assist device are stable. Persistent but slightly improved asymmetric pulmonary edema. No large pleural effusions or pneumothorax. IMPRESSION: 1. Stable support apparatus. 2. Slight improved lung aeration with slight decrease and asymmetric pattern of pulmonary edema. Electronically Signed   By: Marijo Sanes M.D.   On: 06/04/2019 12:48   Ct Head Wo Contrast  Result Date: 06/04/2019 CLINICAL DATA:  Ataxia, possible stroke. EXAM: CT HEAD WITHOUT CONTRAST TECHNIQUE: Contiguous axial images were obtained from the base of the skull through the vertex without intravenous contrast.  COMPARISON:  None. FINDINGS: Brain: Mild diffuse cortical atrophy is noted. Mild chronic ischemic white matter disease is noted. No mass effect or midline shift is noted. Ventricular size is within normal limits. There is no evidence of mass lesion, hemorrhage or acute infarction. Vascular: No hyperdense vessel or unexpected calcification. Skull: Normal. Negative for fracture or focal lesion. Sinuses/Orbits: Mild mucosal thickening is  noted in both maxillary sinuses. Other: Fluid is noted in the mastoid air cells bilaterally. IMPRESSION: Mild diffuse cortical atrophy. Mild chronic ischemic white matter disease. No acute intracranial abnormality seen. Electronically Signed   By: Marijo Conception M.D.   On: 06/04/2019 10:37   Ct Chest Wo Contrast  Result Date: 06/04/2019 CLINICAL DATA:  Aortic disease, exchange of Impella for ventricular vent. EXAM: CT CHEST, ABDOMEN, AND PELVIS without CONTRAST TECHNIQUE: Multidetector CT imaging of the chest, abdomen and pelvis was performed following the standard protocol without intravenous contrast CONTRAST:  None COMPARISON:  None. FINDINGS: CT CHEST FINDINGS Cardiovascular: Interval sternotomy and removal of Impella device with placement of ventricular ECMO component, tip in the left ventricular apex. Small amount of hematoma in the anterior mediastinum. Also with aortic cannula in place passing through upper margin of sternotomy. Surgical drains are in place in this location. Subxiphoid mediastinal drain as well. Endotracheal tube terminates approximately 1.4 cm above the carina. Material within the bronchi peripheral to the tube. Right IJ central venous catheter terminating distal superior vena cava along with a right sided venous access device, likely PICC. Left-sided venous catheter terminates in the brachiocephalic just short of the veno veno ECMO cannula. Mediastinum/Nodes: Signs of stranding in the anterior mediastinum and small anterior mediastinal hematoma after sternotomy. Is Lungs/Pleura: Dense basilar consolidation bilaterally with patchy areas of airspace opacity. Musculoskeletal: Signs of sternotomy as described. Also with signs of right axillary dissection and surgical drain in this location. CT ABDOMEN PELVIS FINDINGS Hepatobiliary: Liver is normal with sludge in the gallbladder. Pancreas: Normal appearance of pancreas. Spleen: Normal in size without focal abnormality. Adrenals/Urinary  Tract: Normal appearance of the adrenal glands. Moderate size left renal cysts with renal sinus component measure approximately 2.9 x 4.3 cm. No signs of hydronephrosis. Stomach/Bowel: No signs of bowel obstruction or acute bowel process. Fluid-filled bowel with bowel management tube in place in the rectum. Vascular/Lymphatic: Right groin venous ECMO cannula in place terminating in the superior vena cava. Vascular findings in the chest as described. Dense atherosclerotic calcification of the abdominal aorta extending into branch vessels. Stranding about the left groin without organized hematoma may reflect previous vascular access. Reproductive: Prostate unremarkable by CT. Other: Ovoid iliacus hematoma moderately large 8.1 x 4.9 x 8.9 cm. No signs of free intraperitoneal air. No signs of ascites. Musculoskeletal: Fractures of fourth through seventh ribs on the right with mild displacement of the right seventh rib anteriorly Fractures of fourth through eighth ribs on the left with nondisplaced appearance but with a mildly displaced fracture of the left fourth costochondral elements. IMPRESSION: 1. Dense basilar consolidation but with patchy areas of airspace opacity suspicious for worsening of pneumonia superimposed on volume loss in this patient on ECMO. 2. Status post sternotomy and removal of Impella device with placement of ventricular ECMO component, tip in the left ventricular apex. 3. Small amount of hematoma in the anterior mediastinum after sternotomy. 4. Veno veno ECMO cannula tip at the brachiocephalic junction, ports of the catheter are present in SVC and upper right atrium. 5. Arterial cannula in the aorta as well. Anterior mediastinum with  small hematoma and mediastinal and pericardial drain in place. 6. Signs of right axillary soft tissue dissection and surgical drain in place. 7. ET tube approximately 1.3-1.4 cm above the carina. 8. Moderately large intramuscular hematoma of the left iliacus muscle.  9. Signs of bilateral rib fractures. 10. Results with respect to basilar consolidation, tube and line position and the presence of left iliacus hematoma were called by telephone at the time of interpretation on 06/04/2019 at 11:05 am to provider [facility] , who verbally acknowledged these results. 11. Additional findings of rib fractures were not discussed initially, will be called to the ordering clinician or representative by the Radiologist Assistant, and communication documented in the PACS or zVision Dashboard. Aortic Atherosclerosis (ICD10-I70.0). Electronically Signed   By: Zetta Bills M.D.   On: 06/04/2019 11:10   Dg Chest Port 1 View  Result Date: 06/04/2019 CLINICAL DATA:  ECMO. EXAM: PORTABLE CHEST 1 VIEW COMPARISON:  05/28/2019 FINDINGS: The patient remains intubated. The in scratched at the endotracheal tube is in stable position 2.5 cm above the carina. ECMO catheters are stable. Mediastinal drain is stable. Left IJ line is again noted. Diffuse interstitial edema is unchanged. IMPRESSION: 1. Stable appearance of interstitial edema. 2. Support apparatus, including ECMO catheters, are stable. Electronically Signed   By: San Morelle M.D.   On: 06/04/2019 06:09   Dg Chest Port 1 View  Result Date: 05/28/2019 CLINICAL DATA:  Status post cardiac surgery. EXAM: PORTABLE CHEST 1 VIEW COMPARISON:  June 03, 2019 FINDINGS: The endotracheal tube terminates just above the carina. There is a right PICC line and right-sided central venous catheter that appear grossly stable in positioning. A mediastinal drain is noted. There is an enteric feeding tube that is looped in the region of the GE junction and may be kinked. There is a secondary enteric tube that projects below the left hemidiaphragm. The patient's Impella device has been removed. ECMO catheters are now identified. There is worsening pulmonary edema. No convincing pneumothorax. Probable moderate-sized bilateral pleural effusions.  IMPRESSION: 1. Endotracheal tube terminates just above the carina. Repositioning should be considered. 2. Lines and tubes as above.  ECMO catheters are now identified. 3. Worsening pulmonary edema. Electronically Signed   By: Constance Holster M.D.   On: 06/18/2019 18:26     Medications:     Scheduled Medications:  acetaminophen (TYLENOL) oral liquid 160 mg/5 mL  650 mg Per Tube Once   Or   acetaminophen  650 mg Rectal Once   arformoterol  15 mcg Nebulization BID   aspirin  81 mg Per Tube Daily   B-complex with vitamin C  1 tablet Per Tube Daily   bisacodyl  5 mg Oral Once   chlorhexidine gluconate (MEDLINE KIT)  15 mL Mouth Rinse BID   Chlorhexidine Gluconate Cloth  6 each Topical Daily   feeding supplement (PRO-STAT SUGAR FREE 64)  30 mL Per Tube QID   guaiFENesin  30 mL Per Tube BID   heparin-papaverine-plasmalyte irrigation   Irrigation To OR   insulin aspart  1-3 Units Subcutaneous Q4H   insulin   Intravenous To OR   [facility] Blood Cardioplegia vial (lidocaine/magnesium/mannitol 0.26g-4g-6.4g)   Intracoronary To OR   mouth rinse  15 mL Mouth Rinse 10 times per day   mupirocin ointment   Nasal BID   pantoprazole  40 mg Intravenous Q12H   phenylephrine  30-200 mcg/min Intravenous To OR   polyvinyl alcohol  1 drop Both Eyes QID   potassium chloride  80 mEq Other To OR   sodium chloride flush  10-40 mL Intracatheter Q12H   tranexamic acid  15 mg/kg Intravenous To OR   tranexamic acid  2 mg/kg Intracatheter To OR   vancomycin 1000 mg in NS (1000 ml) irrigation for Dr. Roxy Manns case   Irrigation To OR    Infusions:   prismasol BGK 4/2.5 400 mL/hr at 06/04/19 2020    prismasol BGK 4/2.5 300 mL/hr at 06/15/2019 0525   amiodarone Stopped (05/28/2019 1747)   cefUROXime (ZINACEF)  IV     cefUROXime (ZINACEF)  IV     dexmedetomidine     feeding supplement (VITAL 1.5 CAL) Stopped (06/13/2019 0000)   fentaNYL infusion INTRAVENOUS 300 mcg/hr (06/23/2019 0502)    fluconazole (DIFLUCAN) IV     heparin 30,000 units/NS 1000 mL solution for CELLSAVER     heparin 1,100 Units/hr (06/07/2019 0532)   meropenem (MERREM) IV Stopped (06/11/2019 0058)   midazolam 10 mg/hr (06/14/2019 0501)   milrinone     nitroGLYCERIN     norepinephrine (LEVOPHED) Adult infusion 1 mcg/min (06/04/19 1800)   prismasol BGK 4/2.5 1,800 mL/hr at 06/18/2019 0502   tranexamic acid (CYKLOKAPRON) infusion ([facility])     vancomycin     vancomycin 750 mg (06/04/19 1112)    PRN Medications: docusate, heparin, influenza vaccine adjuvanted, midazolam, morphine injection, ondansetron (ZOFRAN) IV, pneumococcal 23 valent vaccine, sodium chloride flush, temazepam   Assessment/Plan:   1. Shock: Possible mixed cardiogenic/septic with fever and suspected PNA.  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  Echo with EF 20%, RV moderately HK.  Impella CP placed 10/23. Switched for Impella 5.0 on 10/24. No flow meter on Impella due to damage to sensor on insertion. Patient has LBBB.  PEA arrest 11/3, ECMO started 11/4. ECMO circuit changed due to high dP and rising LDH on 11/7.  On 11/9 with ongoing rise in LDH, Impella removed and LV vent placed.  Now at 3200 RPM with 3.7L flow. LDH starting to trend down, 946 today.  MAP 70s on NE 2. CVVH net UF average around 50 cc/hr, CVP 7. CXR improved, LV vent appears to have well-decompressed the LV - Aim for CVVH net 30-50 today with CVP 6.   - Continue ECMO at current flow.    - Heparin ongoing. Has had bleeding from chest tubes and got 1 unit overnight.  - D/w ECMO coordinator and ECMO specialist  2. CAD:  NSTEMI, hs-TnI 16,000. 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  - continue ASA/statin. No b-blocker with shock - Goal is eventual CABG => after discussion with Dr. [name], potentially today.  Suspect we have him as stable for surgery as we can get him.  He remains high risk, most concerning  immediate issue is hematological but platelets are staying around 40K.  3. AKI: Baseline creatinine 1.3, now AKI likely due to ATN from shock and contrast (had CTA for PE at admission, only 25 cc contrast with cath).  CVVH ongoing.  - Continue CVVH. Management as above 4. Acute Respiratory Failure: Has had PNA with Enterococcus faecalis in BAL cultures. Now re-intubated.  He had PEA arrest with possible aspiration 11/3 and also had pulmonary hemorrhage.  Hemoptysis appears to have stopped. CXR still with diffuse infiltrates but improved with LV venting. Hopefully mostly pulmonary edema but ?ARDS component.  - CVVH management as above. - Continue vanco/meropenem/fluconazole per CCM.  5. ID: Suspected PNA, CXR with diffuse infiltrates. Enterococcus faecalis in BAL cultures initially, now with Candida albicans. Afebrile.  °- Continue meropenem, vancomycin, fluconazole.  °6. LBBB: Unsure chronicity.  °7. Anemia: Hgb 7.6 today, had PRBCs overnight.  °8. Thrombocytopenia: HIT negative initially.  ?Low due to sepsis versus hemolysis. Plts stable at 40K today. Suspect most likely hemolysis given drop in platelets after Impella placed. LDH is slowly improving.  °- Resent HIT with prolonged heparin, pending.  °9. PVCs/NSVT: Quiescent.  °10. FEN: Cortrak,getting post-pyloric TFs.  °11. PAF with post-termination pauses: NSR today.  °- He is no longer on amiodarone.  ° °Very difficult situation.  Only way forward is going to be CABG + potential AoV surgery but very high risk.  He is not an LVAD candidate with AKI on CVVH.  ° °CRITICAL CARE °Performed by:   ° °Total critical care time: 40 minutes ° °Critical care time was exclusive of separately billable procedures and treating other patients. ° °Critical care was necessary to treat or prevent imminent or life-threatening deterioration. ° °Critical care was time spent personally by me on the following activities: development of treatment plan with patient and/or  surrogate as well as nursing, discussions with consultants, evaluation of patient's response to treatment, examination of patient, obtaining history from patient or surrogate, ordering and performing treatments and interventions, ordering and review of laboratory studies, ordering and review of radiographic studies, pulse oximetry and re-evaluation of patient's condition. ° °  °05/27/2019 °7:31 AM ° ° °

## 2019-06-05 NOTE — Progress Notes (Signed)
Morning labs called to Dr. Mardene Sayer. Hemoglobin is 7.6 w/ crit of 22.1, Platelets 40K, and aPTT of 40. Current orders is to give 2000 unit bolus of heparin and increase rate from 1000 units to 1100 units an hour. No further orders. Will continue to monitor.

## 2019-06-05 NOTE — Progress Notes (Signed)
Pharmacy Anti-Coagulation Consult Note:  Pharmacy Consult for Heparin Indication for heparin: ECMO  No Known Allergies  Patient Measurements: Height: 5\' 7"  (170.2 cm)(measured x3) Weight: 159 lb 9.8 oz (72.4 kg) IBW/kg (Calculated) : 66.1  Vital Signs: Temp: 98.7 F (37.1 C) (11/11 0400) Temp Source: Oral (11/11 0400)  Labs: Recent Labs    06/20/2019 1054  06/04/19 0217  06/04/19 0757  06/04/19 1640  06/04/19 2129 06/01/2019 0015 06/23/2019 0433 06/09/2019 0606  HGB  --    < > 6.9*   < > 8.3*   < > 8.5*   < > 7.1* 7.8* 7.6* 6.1*  HCT  --    < > 20.3*   < > 24.5*   < > 25.5*   < > 21.4* 23.0* 22.1* 18.0*  PLT  --    < > 41*  --  82*  --  55*  --  43*  --  40*  --   APTT 67*   < > 65*  --   --   --  48*  --  44*  --  36  --   LABPROT  --    < > 16.9*  --   --   --  17.0*  --   --   --  17.6*  --   INR  --    < > 1.4*  --   --   --  1.4*  --   --   --  1.5*  --   HEPARINUNFRC 0.19*  --   --   --  0.17*  --   --   --  <0.10*  --   --   --   CREATININE  --    < > 1.39*  --   --   --  1.37*  --   --   --  1.29*  --    < > = values in this interval not displayed.    Estimated Creatinine Clearance: 50.5 mL/min (A) (by C-G formula based on SCr of 1.29 mg/dL (H)).  Assessment: 68 yo M admitted with shock and concern for ACS now s/p Impella CP placement in cath lab. Impella CP swapped out for 5.0 d/t hemolysis. Underwent VA ECMO with goal of CABG for revascularization.   ECMO initiate about 1800 11/4 - originally started on heparin at 1500 units/hr systemically, which was held for bleeding.   Impella removed 11/9.  Heparin now systemic at 1200 units/hr.   Fibrinogen 700 >>353, LDH down 1200 > 946  CT bleeding resolved with lower heparin dosing, but PTT dropped to 36.  Goal of Therapy:  Reduce goal to 50-55 overnight d/t bleeding Monitor platelets by anticoagulation protocol: Yes   Plan:  Heparin increased to 1100 units/hr. Will likely recheck aPTT at noon (unless pt in  OR).  Marguerite Olea, [facility] Clinical Pharmacist Phone [phone]  06/07/2019 7:47 AM

## 2019-06-05 NOTE — Op Note (Signed)
CARDIOTHORACIC SURGERY OPERATIVE NOTE  Date of Procedure: 05/28/2019  Preoperative Diagnosis: Severe 3-vessel Coronary Artery Disease, CHF, moderate to severe aortic valve insufficiency; VA ECMO  Postoperative Diagnosis: Same  Procedure:    Coronary Artery Bypass Grafting x 4; aortic valve replacement (21 mm CE Intuity bovine bioprosthetic); left atrial appendage clipping; decannulation from VA ECMO  Left Internal Mammary Artery to Distal Left Anterior Descending Coronary Artery; reversed Saphenous Vein Graft to Posterior Descending Coronary artery and right PLA as sequenced graft;  Reversed Saphenous Vein Graft to 1st Obtuse Marginal Branch (ramus) of Left Circumflex Coronary Artery; Endoscopic Vein Harvest from right Thigh and Lower Leg; completion graft surveillance with indocyanine green fluorescence imaging (SPY); epiaortic ultrasonography; left atrial appendage clipping with 35 mm Atriclip   Surgeon: B. Lorayne Marek, MD  Assistant: Wynetta Fines MD  Anesthesia: get  Operative Findings:  reduced left ventricular systolic function  Good quality left internal mammary artery conduit  good quality saphenous vein conduit  good quality target vessels for grafting    BRIEF CLINICAL NOTE AND INDICATIONS FOR SURGERY  68 yo man admitted nearly 3 weeks ago with acute MI and developed heart failure. Has required some form of mechanical cardiac support and mechanical ventilation since the onset. He has been on veno-arterial extracorporeal membrane oxygenation (ECMO) for past 7 days to support hemodynamics and body perfusion, with the expressed goal of preparing him for definitive surgery. He is taken to the OR today for CABG +/- AVR.    DETAILS OF THE OPERATIVE PROCEDURE  Preparation:  The patient is brought to the operating room on 06/12/2019 and placed in the supine position on the OR table.  Appropriate level of monitoring was confirmed by the anesthesia team.   Intravenous  antibiotics are administered. General endotracheal anesthesia is induced uneventfully. A Foley catheter is placed.  Baseline transesophageal echocardiogram was performed.  Findings were notable for decompressed heart due to [employer] ECMO. There was moderate-to-severe aortic valve insufficiency.   The patient's chest, abdomen, both groins, and both lower extremities are prepared and draped in a sterile manner. A time out procedure is performed.   Surgical Approach and Conduit Harvest:  With the hemodynamics supported by ECMO, completion median sternotomy was performed and the left internal mammary artery is dissected from the chest wall and prepared for bypass grafting. The left internal mammary artery is notably good quality conduit. Simultaneously, the greater saphenous vein is obtained from the patient's right thigh using endoscopic vein harvest technique. The saphenous vein is notably good quality conduit. After removal of the saphenous vein, the small surgical incisions in the lower extremity are closed with absorbable suture. Following systemic heparinization, the left internal mammary artery was transected distally noted to have excellent flow.   Extracorporeal Cardiopulmonary Bypass and Myocardial Protection:  The pericardium is opened. Proper ACT is confirmed, and using the existing right CFA venous cannula and the ascending aorta cannula, perfusion is switched from ECMO to conventional cardiopulmonary bypass. A retrograde cardioplegia cannula is placed through the right atrium into the coronary sinus.  The entire pre-bypass portion of the operation was notable for stable hemodynamics on ECMO.  After going on bypass, the surface of the heart is inspected. Distal target vessels are selected for coronary artery bypass grafting. A cardioplegia cannula is placed in the ascending aorta.    The patient is allowed to cool passively to 30 C systemic temperature.  Epiaortic ultrasonography is  performed, showing a mid-ascending aortic posterior plaque, but there was a clear  segment of distal ascending aorta that appeared suitable for clamping. The aortic cross clamp is applied and cold blood cardioplegia is delivered initially in an antegrade fashion through the aortic root. Supplemental cardioplegia is given retrograde through the coronary sinus catheter.  Iced saline slush is applied for topical hypothermia.  The initial cardioplegic arrest is rapid with early diastolic arrest.  Repeat doses of cardioplegia are administered intermittently throughout the entire cross clamp portion of the operation through the aortic root, through the coronary sinus catheter, and through subsequently placed vein grafts in order to maintain completely flat electrocardiogram.  Myocardial protection was felt to be excellent. The left atrial appendage is excluded with an Atriclip device (35 mm) due to atrial fibrillation.    Coronary Artery Bypass Grafting:   The 1st obtuse marginal branch of the left circumflex coronary artery (ramus) was grafted using a reversed saphenous vein graft in an end-to-side fashion.  At the site of distal anastomosis the target vessel was good quality and measured approximately 1.5 mm in diameter. Anastomotic patency and runoff was confirmed with indocyanine green fluorescence imaging (SPY).  The  Posterolateral artery arising from the RCA  was grafted using a reversed saphenous vein graft in an end-to-side fashion.  At the site of distal anastomosis the target vessel was good quality and measured approximately 1.5 mm in diameter. Anastomotic patency and runoff was confirmed with indocyanine green fluorescence imaging (SPY). Next, the posterior descending branch of the right coronary artery was grafted in and end-to-side fashion using a sequential saphenous vein graft off of the distal segment of the vein graft placed to the PLA coronary artery.  At the site of distal anastomosis the  target vessel was good quality and measured approximately 1.5 mm in diameter.Anastomotic patency and runoff was confirmed with indocyanine green fluorescence imaging (SPY).  The distal left anterior coronary artery was grafted with the left internal mammary artery in an end-to-side fashion.  At the site of distal anastomosis the target vessel was fair quality and measured slightly less than 1.5 mm in diameter. Anastomotic patency and runoff was confirmed with indocyanine green fluorescence imaging (SPY).  Aortic valve replacement:  The ascending aorta was opened. The valve was tri-leaflet and each leaflet was redundant and the base of each leaflet was stiffened by annular calcium. The leaflets were resected. Magna Ease sizers were used to measure the annulus at 21 mm. A 21 mm Edwards Intuity bovine bioprosthetic as prescribed. The valve seated well. The aortotomy was closed in layers.   All proximal vein graft anastomoses were placed directly to the ascending aorta prior to removal of the aortic cross clamp.  A hot shot dose of cardioplegia was given. The aortic cross clamp was removed after a total cross clamp time of 123 minutes.   Procedure Completion:  All proximal and distal coronary anastomoses were inspected for hemostasis and appropriate graft orientation. Epicardial pacing wires are fixed to the right ventricular outflow tract and to the right atrial appendage. The patient is rewarmed to 37C temperature. The patient is weaned and disconnected from cardiopulmonary bypass.  The patient's rhythm at separation from bypass was heart block.  The patient was weaned from cardiopulmonary bypass with moderate inotropic support. Total cardiopulmonary bypass time for the operation was 189 minutes.  Followup transesophageal echocardiogram performed after separation from bypass revealed mild paravalvular leak at the aortic valve level. RV function was normal; LV function was moderately depressed.  The  aortic and venous cannula were removed uneventfully. Protamine was  administered to reverse the anticoagulation. The mediastinum and pleural space were inspected for hemostasis and irrigated with saline solution. The mediastinum and bilateral pleural space were drained using 28 Fr fluted chest tubes placed through separate stab incisions inferiorly.  The sternum was left open due to need for high PEEP for respiratory function.  The post-bypass portion of the operation was notable for stable rhythm and hemodynamics.    Disposition:  The patient tolerated the procedure well and is transported to the surgical intensive care in stable condition. There are no intraoperative complications. All sponge instrument and needle counts are verified correct at completion of the operation.    Brantley Fling, MD 06/11/2019 7:13 PM

## 2019-06-05 NOTE — Progress Notes (Signed)
Echocardiogram Echocardiogram Transesophageal has been performed.  Raymond Chavez 06/16/2019, 9:38 AM 

## 2019-06-05 NOTE — Brief Op Note (Addendum)
05/24/2019 - 06/16/2019  2:05 PM  PATIENT:  Raymond Chavez  68 y.o. male  PRE-OPERATIVE DIAGNOSIS:  1. Coronary artery disease 2. Cardiogenic shock 3. Moderate to severe aortic Insufficiency   POST-OPERATIVE DIAGNOSIS:  1. Coronary artery disease 2. Cardiogenic shock 3. Moderate to severe aortic Insufficiency  PROCEDURE: TRANSESOPHAGEAL ECHOCARDIOGRAM (TEE), MEDIAN STERNOTOMY for CORONARY ARTERY BYPASS GRAFTING (CABG) x 4  (LIMA to LAD, SVG to RAMUS INTERMEDIATE, SVG SEQUENTIALLY to PDA and PLB) USING LEFT INTERNAL MAMMARY ARTERY AND RIGHT GREATER SAPHENOUS VEIN HARVESTED ENDOSCOPICALLY  AORTIC VALVE REPLACEMENT (AVR) (USING EDWARDS INTUITY, MODEL # 8300AB, SERIAL # [id number], Size 21MM), CLIPPING of LA APPENDAGE (USING 35MM ATRIAL CLIP), VIDEO BRONCHOSCOPY REMOVAL OF ECMO (Extracorporeal Membrane Oxygenation)  SURGEON:  Surgeon(s) and Role:    1. Wonda Olds, MD - Primary    2. Grace Isaac, MD - Assisting  PHYSICIAN ASSISTANT: Lars Pinks PA-C  ASSISTANTS: Vernie Murders RNFA  ANESTHESIA:   general  EBL:  Per anesthesia and perfusion record  DRAINS: Chest tubes placed in the mediastinal and pleural spaces   SPECIMEN:  Source of Specimen:  Native AV leaflets  DISPOSITION OF SPECIMEN:  PATHOLOGY  COUNTS CORRECT:  YES  DICTATION: .Dragon Dictation  PLAN OF CARE: Admit to inpatient   PATIENT DISPOSITION:  ICU - intubated and critically ill.   Delay start of Pharmacological VTE agent (>24hrs) due to surgical blood loss or risk of bleeding: yes  BASELINE WEIGHT: 72.4 kg  Agree with BON.  [name] Z. [name], [place]

## 2019-06-05 NOTE — Anesthesia Procedure Notes (Signed)
Procedure Name: Intubation Date/Time: 06/16/2019 11:16 AM Performed by: Janene Harvey, CRNA Pre-anesthesia Checklist: Patient identified, Emergency Drugs available, Suction available and Patient being monitored Patient Re-evaluated:Patient Re-evaluated prior to induction Preoxygenation: Pre-oxygenation with 100% oxygen Tube size: 8.0 mm Placement Confirmation: ETT inserted through vocal cords under direct vision and positive ETCO2 Comments: 8.0 ett placed over tube exchanger by Ermalene Postin. Placement confirmed with bronch by Ermalene Postin

## 2019-06-05 NOTE — H&P (Signed)
History and Physical Interval Note:  06/04/2019 7:36 AM  Raymond Chavez  has presented today for surgery, with the diagnosis of CAD and acute on chronic CHF with unclear specific etiology.  This has required aggressive hemodynamic and pharmacologic support for past 2 weeks, but he has stabilized relatively speaking on ECMO. The various methods of treatment have been discussed with the family, mostly his daughter. After consideration of risks, benefits and other options for treatment, the patient has consented to  Procedure(s): CORONARY ARTERY BYPASS GRAFTING (CABG) (N/A) AORTIC VALVE REPLACEMENT (AVR) (N/A) TRANSESOPHAGEAL ECHOCARDIOGRAM (TEE) (N/A) and possible maze procedure, possible temporary LVAD, possible re-institution of ECMO as a surgical intervention.  The patient's history has been reviewed, patient examined, no change in status, stable for surgery.  I have reviewed the patient's chart and labs.  Questions were answered to the patient's family's satisfaction.     Wonda Olds, MD TCTS

## 2019-06-05 NOTE — Progress Notes (Signed)
Initial Nutrition Assessment  DOCUMENTATION CODES:   Not applicable  INTERVENTION:   Tube Feeding:  Vital 1.5 at  50 ml/hr Pro-Stat 30 mL QID Provides 2200 kcals, 141 g of protein and 917 mL of free water  Continue B-complex with C  NUTRITION DIAGNOSIS:   Inadequate oral intake related to inability to eat as evidenced by NPO status.  Being addressed via TF   GOAL:   Patient will meet greater than or equal to 90% of their needs  Met  MONITOR:   Vent status, Labs, Weight trends, Skin, I & O's  REASON FOR ASSESSMENT:   Ventilator    ASSESSMENT:   68 year old male who presented to the ED on 10/22 with chest pain and SOB. PMH of CAD, HTN, MI in 1998. Pt admitted with acute hypoxic respiratory failure due to acute pulmonary edema, PNA, possible ACS/NSTEMI. Pt required intubation.  RD working remotely.  10/23- s/p right/left heart cath 10/24- impella changed from femoral to R axillary 10/28- s/p bronchoscopy  10/29- vas cath, start CRRT 11/01- extubated 11/03- cardiac arrest, re-intubation 11/04- OR for Cannulation for ECMO, partial sternotomy, TEE; ECMO initiated 11/05 Cortrak tube placed at LOT by radiology 11/11 TEE, CABG x 4, removal of ECMO  Pt remains on vent support, remains on CRRT  TF held at midnight for procedure. Vital 1.5 previously infusing at 50 ml/hr and pt tolerating  Current wt 72.4 kg; admission weight 72.6 kg  No new wounds identified per RN skin assessment  Labs: reviewed Meds: insulin drip   Diet Order:   Diet Order    None      EDUCATION NEEDS:   No education needs have been identified at this time  Skin:  Skin Assessment: Skin Integrity Issues: Skin Integrity Issues:: Incisions Incisions: R chest/R groin  Last BM:  11/11 rectal tube  Height:   Ht Readings from Last 1 Encounters:  05/12/2019 _0  (1.702 m)    Weight:   Wt Readings from Last 1 Encounters:  05/28/2019 72.4 kg    Ideal Body Weight:  67.3  kg  BMI:  Body mass index is 25 kg/m.  Estimated Nutritional Needs:   Kcal:  2115 kcals  Protein:  140-155 g  Fluid:  1.7 L (goal to provide the least amount of fluid from nutrition prescrition as possbile at this time)   Kerman Passey MS, RDN, LDN, CNSC [phone] Pager  [phone] Weekend/On-Call Pager

## 2019-06-05 NOTE — Progress Notes (Signed)
Patient back from OR Lungs are noncompliant from de-recruitment On high doses of iNO/FiO2/PEEP CXR reviewed, edema/ARDS pattern Switched vent from [facility] to [facility] and changed around driving pressures to increase MV.  Repeat ABG showing improvement in pH and okay O2. Will order repeat blood gas at 9PM and have RN notify [place] Notified 2H RT of changes

## 2019-06-05 NOTE — Progress Notes (Signed)
RRT goal for the patient this shift is to maintain good ventilation and oxygenation via utilization of mechanical ventilation and INO therapy using appropriate settings to meet systemic and myocardial oxygen demands to assure adequate tissue oxygenation/perfusion. Patient is currently requiring a lot of hemodynamic medication support to assure body perfusion. Patient is currently stable at this time. No complications noted. RRT will continue to monitor patient clinical presentation, respiratory status, and all hemodynamic parameters.   Raymond Chavez [name], RRT, RCP

## 2019-06-05 NOTE — Progress Notes (Signed)
[facility] NEPHROLOGY PROGRESS NOTE  Assessment/ Plan: Pt is a 68 y.o. yo male with cardiogenic shock who underwent placement of Impella on 10/24, on milrinone and Levophed, cardiac cath with three-vessel disease and echocardiogram revealing EF of 20%, respiratory failure, consulted for AKI.  Baseline creatinine level around 1.3.  #Anuric acute kidney injury on CKD: due to cardiorenal syndrome and contrast nephropathy, complicated by cardiac arrest on 11/3. CRRT since 10/30.  Currently tolerating CRRT.  Continue all 4K solution at current flow settings.  Goal net even with AHF  TCTS assisting with fluid management., sig losses from chest tube. No clotting, systemic heparinization.Marland Kitchen    #Cardiac arrest/cardiogenic shock: EF 20%, on ECMO.  Had  cardiac arrest on 11/3.   Heart failure team is following.  CABG 11/11  #Acute respiratory failure with hypoxia: Due to CHF and pneumonia. On mechanical ventilation and ECMO. CCM following  #CAD: NSTEMI, CABG with severe three-vessel disease.   #Hypophosphatemia, hypocalcemia: stable,     Subjective:   To OR today for CABG, potential AVR  Stable on CRRT  K4.9, phosphorus 2.7   Objective Vital signs in last 24 hours: Vitals:   06/14/2019 0000 06/01/2019 0100 06/23/2019 0200 05/28/2019 0400  BP:      Pulse:      Resp: _0 Temp: 98.2 F (36.8 C)   98.7 F (37.1 C)  TempSrc: Oral   Oral  SpO2: 100%   100%  Weight:    72.4 kg  Height:       Weight change: 0.1 kg  Intake/Output Summary (Last 24 hours) at 05/31/2019 1211 Last data filed at 06/13/2019 1030 Gross per 24 hour  Intake 2889.44 ml  Output 3506 ml  Net -616.56 ml       Labs: Basic Metabolic Panel: Recent Labs  Lab 06/02/2019 1013  06/04/19 0217  06/04/19 1313  06/04/19 1640  06/24/2019 0015 06/02/2019 0433 06/15/2019 0606  NA  --    < > 136   < >  --    < > 137   < > 139 138 138  K  --    < > 5.0   < >  --    < > 5.0   < > 5.0 4.9 4.7  CL  --    < >  104  --   --   --  107  --   --  105  --   CO2  --    < > 23  --   --   --  22  --   --  23  --   GLUCOSE  --    < > 150*  --   --   --  167*  --   --  104*  --   BUN  --    < > 45*  --   --   --  46*  --   --  45*  --   CREATININE  --    < > 1.39*  --   --   --  1.37*  --   --  1.29*  --   CALCIUM  --    < > 7.1*  --   --   --  7.1*  --   --  7.3*  --   PHOS 2.9  --   --   --  4.3  --   --   --   --  2.7  --    < > =  values in this interval not displayed.   Liver Function Tests: Recent Labs  Lab 06/04/19 0217 06/04/19 1640 06/22/2019 0433  AST 35 35 29  ALT _0 ALKPHOS 65 75 71  BILITOT 1.8* 1.8* 2.4*  PROT 4.3* 4.4* 4.5*  ALBUMIN 2.2* 2.4* 2.5*   No results for input(s): LIPASE, AMYLASE in the last 168 hours. No results for input(s): AMMONIA in the last 168 hours. CBC: Recent Labs  Lab 06/07/2019 2308  05/30/19 0550  06/02/19 1150  06/04/19 0217  06/04/19 0757  06/04/19 1640  06/04/19 2129 Chavez 0015 06/04/2019 0433 06/11/2019 0606  WBC 19.3*   < > 15.6*   < > 14.9*   < > 9.3  --  9.8  --  8.4  --  5.8  --  6.8  --   NEUTROABS 15.6*  --  12.9*  --  13.6*  --   --   --   --   --   --   --   --   --   --   --   HGB 11.0*   < > 9.0*   < > 8.1*   < > 6.9*   < > 8.3*   < > 8.5*   < > 7.1* 7.8* 7.6* 6.1*  HCT 31.7*   < > 26.4*   < > 24.1*   < > 20.3*   < > 24.5*   < > 25.5*   < > 21.4* 23.0* 22.1* 18.0*  MCV 88.5   < > 90.1   < > 93.1   < > 90.2  --  85.4  --  87.0  --  87.7  --  86.7  --   PLT 46*   < > 97*   < > 32*   < > 41*  --  82*  --  55*  --  43*  --  40*  --    < > = values in this interval not displayed.   Cardiac Enzymes: No results for input(s): CKTOTAL, CKMB, CKMBINDEX, TROPONINI in the last 168 hours. CBG: Recent Labs  Lab 06/04/19 1652 06/04/19 2000 05/28/2019 0013 06/13/2019 0425 05/26/2019 0604  GLUCAP 139* 118* 123* 84 92    Iron Studies: No results for input(s): IRON, TIBC, TRANSFERRIN, FERRITIN in the last 72 hours. Studies/Results: Ct Abdomen  Pelvis Wo Contrast  Result Date: 06/04/2019 CLINICAL DATA:  Aortic disease, exchange of Impella for ventricular vent. EXAM: CT CHEST, ABDOMEN, AND PELVIS without CONTRAST TECHNIQUE: Multidetector CT imaging of the chest, abdomen and pelvis was performed following the standard protocol without intravenous contrast CONTRAST:  None COMPARISON:  None. FINDINGS: CT CHEST FINDINGS Cardiovascular: Interval sternotomy and removal of Impella device with placement of ventricular ECMO component, tip in the left ventricular apex. Small amount of hematoma in the anterior mediastinum. Also with aortic cannula in place passing through upper margin of sternotomy. Surgical drains are in place in this location. Subxiphoid mediastinal drain as well. Endotracheal tube terminates approximately 1.4 cm above the carina. Material within the bronchi peripheral to the tube. Right IJ central venous catheter terminating distal superior vena cava along with a right sided venous access device, likely PICC. Left-sided venous catheter terminates in the brachiocephalic just short of the veno veno ECMO cannula. Mediastinum/Nodes: Signs of stranding in the anterior mediastinum and small anterior mediastinal hematoma after sternotomy. Is Lungs/Pleura: Dense basilar consolidation bilaterally with patchy areas of airspace opacity. Musculoskeletal: Signs of sternotomy as described. Also with signs  of right axillary dissection and surgical drain in this location. CT ABDOMEN PELVIS FINDINGS Hepatobiliary: Liver is normal with sludge in the gallbladder. Pancreas: Normal appearance of pancreas. Spleen: Normal in size without focal abnormality. Adrenals/Urinary Tract: Normal appearance of the adrenal glands. Moderate size left renal cysts with renal sinus component measure approximately 2.9 x 4.3 cm. No signs of hydronephrosis. Stomach/Bowel: No signs of bowel obstruction or acute bowel process. Fluid-filled bowel with bowel management tube in place in the  rectum. Vascular/Lymphatic: Right groin venous ECMO cannula in place terminating in the superior vena cava. Vascular findings in the chest as described. Dense atherosclerotic calcification of the abdominal aorta extending into branch vessels. Stranding about the left groin without organized hematoma may reflect previous vascular access. Reproductive: Prostate unremarkable by CT. Other: Ovoid iliacus hematoma moderately large 8.1 x 4.9 x 8.9 cm. No signs of free intraperitoneal air. No signs of ascites. Musculoskeletal: Fractures of fourth through seventh ribs on the right with mild displacement of the right seventh rib anteriorly Fractures of fourth through eighth ribs on the left with nondisplaced appearance but with a mildly displaced fracture of the left fourth costochondral elements. IMPRESSION: 1. Dense basilar consolidation but with patchy areas of airspace opacity suspicious for worsening of pneumonia superimposed on volume loss in this patient on ECMO. 2. Status post sternotomy and removal of Impella device with placement of ventricular ECMO component, tip in the left ventricular apex. 3. Small amount of hematoma in the anterior mediastinum after sternotomy. 4. Veno veno ECMO cannula tip at the brachiocephalic junction, ports of the catheter are present in SVC and upper right atrium. 5. Arterial cannula in the aorta as well. Anterior mediastinum with small hematoma and mediastinal and pericardial drain in place. 6. Signs of right axillary soft tissue dissection and surgical drain in place. 7. ET tube approximately 1.3-1.4 cm above the carina. 8. Moderately large intramuscular hematoma of the left iliacus muscle. 9. Signs of bilateral rib fractures. 10. Results with respect to basilar consolidation, tube and line position and the presence of left iliacus hematoma were called by telephone at the time of interpretation on 06/04/2019 at 11:05 am to provider [facility] , who verbally acknowledged these  results. 11. Additional findings of rib fractures were not discussed initially, will be called to the ordering clinician or representative by the Radiologist Assistant, and communication documented in the PACS or zVision Dashboard. Aortic Atherosclerosis (ICD10-I70.0). Electronically Signed   By: Zetta Bills M.D.   On: 06/04/2019 11:10   Dg Chest 1 View  Result Date: 06/01/2019 CLINICAL DATA:  Open heart surgery. EXAM: CHEST  1 VIEW COMPARISON:  Multiple recent chest films. The most recent is earlier from today at 9:33 a.m. FINDINGS: The endotracheal tube, right IJ catheter, right PICC line, right subclavian Impella device, left IJ catheter and left ventricular assist device are stable. Persistent but slightly improved asymmetric pulmonary edema. No large pleural effusions or pneumothorax. IMPRESSION: 1. Stable support apparatus. 2. Slight improved lung aeration with slight decrease and asymmetric pattern of pulmonary edema. Electronically Signed   By: Marijo Sanes M.D.   On: 06/16/2019 12:48   Ct Head Wo Contrast  Result Date: 06/04/2019 CLINICAL DATA:  Ataxia, possible stroke. EXAM: CT HEAD WITHOUT CONTRAST TECHNIQUE: Contiguous axial images were obtained from the base of the skull through the vertex without intravenous contrast. COMPARISON:  None. FINDINGS: Brain: Mild diffuse cortical atrophy is noted. Mild chronic ischemic white matter disease is noted. No mass effect or midline shift  is noted. Ventricular size is within normal limits. There is no evidence of mass lesion, hemorrhage or acute infarction. Vascular: No hyperdense vessel or unexpected calcification. Skull: Normal. Negative for fracture or focal lesion. Sinuses/Orbits: Mild mucosal thickening is noted in both maxillary sinuses. Other: Fluid is noted in the mastoid air cells bilaterally. IMPRESSION: Mild diffuse cortical atrophy. Mild chronic ischemic white matter disease. No acute intracranial abnormality seen. Electronically Signed    By: Marijo Conception M.D.   On: 06/04/2019 10:37   Ct Chest Wo Contrast  Result Date: 06/04/2019 CLINICAL DATA:  Aortic disease, exchange of Impella for ventricular vent. EXAM: CT CHEST, ABDOMEN, AND PELVIS without CONTRAST TECHNIQUE: Multidetector CT imaging of the chest, abdomen and pelvis was performed following the standard protocol without intravenous contrast CONTRAST:  None COMPARISON:  None. FINDINGS: CT CHEST FINDINGS Cardiovascular: Interval sternotomy and removal of Impella device with placement of ventricular ECMO component, tip in the left ventricular apex. Small amount of hematoma in the anterior mediastinum. Also with aortic cannula in place passing through upper margin of sternotomy. Surgical drains are in place in this location. Subxiphoid mediastinal drain as well. Endotracheal tube terminates approximately 1.4 cm above the carina. Material within the bronchi peripheral to the tube. Right IJ central venous catheter terminating distal superior vena cava along with a right sided venous access device, likely PICC. Left-sided venous catheter terminates in the brachiocephalic just short of the veno veno ECMO cannula. Mediastinum/Nodes: Signs of stranding in the anterior mediastinum and small anterior mediastinal hematoma after sternotomy. Is Lungs/Pleura: Dense basilar consolidation bilaterally with patchy areas of airspace opacity. Musculoskeletal: Signs of sternotomy as described. Also with signs of right axillary dissection and surgical drain in this location. CT ABDOMEN PELVIS FINDINGS Hepatobiliary: Liver is normal with sludge in the gallbladder. Pancreas: Normal appearance of pancreas. Spleen: Normal in size without focal abnormality. Adrenals/Urinary Tract: Normal appearance of the adrenal glands. Moderate size left renal cysts with renal sinus component measure approximately 2.9 x 4.3 cm. No signs of hydronephrosis. Stomach/Bowel: No signs of bowel obstruction or acute bowel process.  Fluid-filled bowel with bowel management tube in place in the rectum. Vascular/Lymphatic: Right groin venous ECMO cannula in place terminating in the superior vena cava. Vascular findings in the chest as described. Dense atherosclerotic calcification of the abdominal aorta extending into branch vessels. Stranding about the left groin without organized hematoma may reflect previous vascular access. Reproductive: Prostate unremarkable by CT. Other: Ovoid iliacus hematoma moderately large 8.1 x 4.9 x 8.9 cm. No signs of free intraperitoneal air. No signs of ascites. Musculoskeletal: Fractures of fourth through seventh ribs on the right with mild displacement of the right seventh rib anteriorly Fractures of fourth through eighth ribs on the left with nondisplaced appearance but with a mildly displaced fracture of the left fourth costochondral elements. IMPRESSION: 1. Dense basilar consolidation but with patchy areas of airspace opacity suspicious for worsening of pneumonia superimposed on volume loss in this patient on ECMO. 2. Status post sternotomy and removal of Impella device with placement of ventricular ECMO component, tip in the left ventricular apex. 3. Small amount of hematoma in the anterior mediastinum after sternotomy. 4. Veno veno ECMO cannula tip at the brachiocephalic junction, ports of the catheter are present in SVC and upper right atrium. 5. Arterial cannula in the aorta as well. Anterior mediastinum with small hematoma and mediastinal and pericardial drain in place. 6. Signs of right axillary soft tissue dissection and surgical drain in place. 7. ET tube  approximately 1.3-1.4 cm above the carina. 8. Moderately large intramuscular hematoma of the left iliacus muscle. 9. Signs of bilateral rib fractures. 10. Results with respect to basilar consolidation, tube and line position and the presence of left iliacus hematoma were called by telephone at the time of interpretation on 06/04/2019 at 11:05 am to  provider [facility] , who verbally acknowledged these results. 11. Additional findings of rib fractures were not discussed initially, will be called to the ordering clinician or representative by the Radiologist Assistant, and communication documented in the PACS or zVision Dashboard. Aortic Atherosclerosis (ICD10-I70.0). Electronically Signed   By: Zetta Bills M.D.   On: 06/04/2019 11:10   Dg Chest Port 1 View  Result Date: 06/13/2019 CLINICAL DATA:  68 year old male status post open heart surgery. EXAM: PORTABLE CHEST 1 VIEW COMPARISON:  Chest radiograph dated 06/04/2019 FINDINGS: Endotracheal tube remains above the carina in similar position. Enteric tube extends below the diaphragm with side-port just distal to the GE junction and tip in the proximal stomach. Left IJ central venous line and right-sided PICC and ECMO apparatus remain in similar position. Diffuse bilateral airspace and interstitial densities as well as subpleural densities of the upper lobes appears similar to prior radiograph. No pneumothorax. Stable cardiac silhouette. No acute osseous pathology. IMPRESSION: 1. No significant interval change in the appearance of the lungs compared to prior radiograph. 2. Support lines and tubes are in similar position. Electronically Signed   By: Anner Crete M.D.   On: 06/11/2019 08:11   Dg Chest Port 1 View  Result Date: 06/04/2019 CLINICAL DATA:  ECMO. EXAM: PORTABLE CHEST 1 VIEW COMPARISON:  05/26/2019 FINDINGS: The patient remains intubated. The in scratched at the endotracheal tube is in stable position 2.5 cm above the carina. ECMO catheters are stable. Mediastinal drain is stable. Left IJ line is again noted. Diffuse interstitial edema is unchanged. IMPRESSION: 1. Stable appearance of interstitial edema. 2. Support apparatus, including ECMO catheters, are stable. Electronically Signed   By: San Morelle M.D.   On: 06/04/2019 06:09   Dg Chest Port 1 View  Result Date:  06/15/2019 CLINICAL DATA:  Status post cardiac surgery. EXAM: PORTABLE CHEST 1 VIEW COMPARISON:  June 03, 2019 FINDINGS: The endotracheal tube terminates just above the carina. There is a right PICC line and right-sided central venous catheter that appear grossly stable in positioning. A mediastinal drain is noted. There is an enteric feeding tube that is looped in the region of the GE junction and may be kinked. There is a secondary enteric tube that projects below the left hemidiaphragm. The patient's Impella device has been removed. ECMO catheters are now identified. There is worsening pulmonary edema. No convincing pneumothorax. Probable moderate-sized bilateral pleural effusions. IMPRESSION: 1. Endotracheal tube terminates just above the carina. Repositioning should be considered. 2. Lines and tubes as above.  ECMO catheters are now identified. 3. Worsening pulmonary edema. Electronically Signed   By: Constance Holster M.D.   On: 06/12/2019 18:26    Medications: Infusions: .  prismasol BGK 4/2.5 400 mL/hr at 06/04/19 2020  .  prismasol BGK 4/2.5 300 mL/hr at 06/15/2019 0525  . cefUROXime (ZINACEF)  IV    . feeding supplement (VITAL 1.5 CAL) Stopped (06/24/2019 0000)  . fentaNYL infusion INTRAVENOUS Stopped (06/20/2019 0920)  . [MAR Hold] fluconazole (DIFLUCAN) IV    . heparin 30,000 units/NS 1000 mL solution for CELLSAVER    . heparin Stopped (05/28/2019 0920)  . [MAR Hold] meropenem (MERREM) IV 1 g (06/22/2019  4619)  . midazolam Stopped (06/09/2019 0920)  . milrinone    . nitroGLYCERIN    . [MAR Hold] norepinephrine (LEVOPHED) Adult infusion 3 mcg/min (06/18/2019 1141)  . prismasol BGK 4/2.5 1,800 mL/hr at 05/30/2019 0802  . [MAR Hold] vancomycin 750 mg (06/04/19 1112)    Scheduled Medications: . [MAR Hold] sodium chloride   Intravenous Once  . [MAR Hold] acetaminophen (TYLENOL) oral liquid 160 mg/5 mL  650 mg Per Tube Once   Or  . [MAR Hold] acetaminophen  650 mg Rectal Once  . [MAR Hold]  arformoterol  15 mcg Nebulization BID  . [MAR Hold] aspirin  81 mg Per Tube Daily  . [MAR Hold] B-complex with vitamin C  1 tablet Per Tube Daily  . bisacodyl  5 mg Oral Once  . [MAR Hold] chlorhexidine gluconate (MEDLINE KIT)  15 mL Mouth Rinse BID  . [MAR Hold] Chlorhexidine Gluconate Cloth  6 each Topical Daily  . [MAR Hold] feeding supplement (PRO-STAT SUGAR FREE 64)  30 mL Per Tube QID  . [MAR Hold] guaiFENesin  30 mL Per Tube BID  . heparin-papaverine-plasmalyte irrigation   Irrigation To OR  . [MAR Hold] insulin aspart  1-3 Units Subcutaneous Q4H  . [place] Blood Cardioplegia vial (lidocaine/magnesium/mannitol 0.26g-4g-6.4g)   Intracoronary To OR  . [MAR Hold] mouth rinse  15 mL Mouth Rinse 10 times per day  . [MAR Hold] mupirocin ointment   Nasal BID  . [MAR Hold] pantoprazole  40 mg Intravenous Q12H  . [MAR Hold] polyvinyl alcohol  1 drop Both Eyes QID  . potassium chloride  80 mEq Other To OR  . [MAR Hold] sodium chloride flush  10-40 mL Intracatheter Q12H  . tranexamic acid  2 mg/kg Intracatheter To OR  . vancomycin 1000 mg in NS (1000 ml) irrigation for Dr. Roxy Manns case   Irrigation To OR    have reviewed scheduled and prn medications.  Physical Exam:  General: Intubated and sedated, critically ill Heart:RRR, s1s2 nl Lungs: Bibasal coarse breath sound, no wheezing Abdomen:soft, Non-tender, non-distended Extremities: LE edema + Dialysis Access: Left IJ catheter site clean.  Multiple line for ECMO.  Raymond Chavez,Raymond Chavez  LOS: 20 days

## 2019-06-05 NOTE — Progress Notes (Signed)
Aerogen set up for inline breathing treatment.

## 2019-06-05 NOTE — Progress Notes (Signed)
Patient currently flowing at 3.5-3.6 lpm. At shift change patient was flowing at 3.8-3.9. Pven pressures became more negative causing circuit to chug. Patient was given one 250 of albumin. Flows did improve. At this time the Pven pressure is beginning to become more negative and starting to chug again with a drop in flow on the ecmo and LV-vent. Another 250 of albumin was given. Patient converted to AFIB HR 110-120. Also a decrease in hemoglobin was noticed on the ecmo pump and a CBC was drawn. 1 unit of PRBC was given due to hemoglobin being 7.1. Labs and flows called to Dr. Mardene Sayer. No further orders.  Will continue to monitor.

## 2019-06-05 NOTE — Progress Notes (Addendum)
NAME:  Raymond Chavez, MRN:  [id number], DOB:  07-Apr-1951, LOS: 24 ADMISSION DATE:  04/27/2019, CONSULTATION DATE:  05/25/2019 REFERRING MD:  Billy Fischer  CHIEF COMPLAINT:  SOB   Brief History   Raymond Chavez is a 68 y.o. male who was admitted with acute hypoxic respiratory failure due to acute pulmonary edema in setting of NSTEMI, cardiogenic shock, possible PNA, He required intubation in the ED after failing BiPAP. On impella for hemodynamic support, ECMO on 11/4 CVTS and cardiology are discussing about CABG, LVAD  Past Medical History  HTN, CAD.  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  10/27-heparin stopped due to low platelets.  HIT panel sent.  Starting bivalirudin.  Off Levophed.  On minimal vent support but failed weaning trials 10/28 Bronchoscopy  10/29 Started on CRRT 10/30 Bronchoscopy with mucous plug removal 11/1 Extubated 11/4 progressive shock, respiratory failure requiring reintubation.  Pulmonary hemorrhage, heparin held.  Antibiotics broadened empirically. 11/4 cannulated and [facility] ECMO initiated 1730 11/5 paralytics stopped 11/9 Impella device out    Consults:  Cardiology PCCM Nephrology   Procedures:  ETT 10/22 >11/1 ETT 11/4 >>  Swan 10/22 > Impella 10/23 >> 10/24 Changed to 5.0 via right axillary >>  Vas cath 10/29  Aline 11/3 ECMO 11/4 >>   Significant Diagnostic Tests:  CTA chest 10/22 > no PE, b/l basilar consolidation, diffuse interlobular septal thickening with GGO's, b/l hilar adenopathy. Echo 11/2: LVEF 15%, impella in place 10/27 UE venous doppler: Right: No evidence of deep vein thrombosis in the upper extremity. However, unable to visualize the IJV, axillary. No evidence of superficial vein thrombosis in the upper extremity. However, unable to visualize the IJV,  axillary. No evidence of thrombosis in the . However, unable to visualize the IJV, axillary. Left: No evidence of thrombosis in the subclavian.     Micro Data:  Blood 10/22, 10/24, 10/28, 10/29 >>> NGTD Sputum 10/24 >>> Neg Urine 10/22 >>>Neg Urine Strep > Neg RVP 10/22 > Neg MRSA PCR > Neg SARS CoV2 10/22 > neg. BAL 10/28 > Enterococcus faecalis (40 K) Blood 10/29: negative Tracheal aspirate 11/8 >> budding yeast >> BAL 11/9 >> budding yeast  Antimicrobials:  Vanc 10/22 >10/25, 10/28>10/31 Cefepime 10/22 >10/26, 10/29>11/1 Ampicillin 11/1>11/4 vanc 11/4-> Merrem11/4-> Fluconazole 11/9 >>   Interim history/subjective:  Afebrile Even on CRRT due to Pven issues Remains heavily sedated Minimal vent support Alkalotic/hyperoxic on ABG  Objective:  Blood pressure (!) 0/0, pulse 65, temperature 98.7 F (37.1 C), temperature source Oral, resp. rate 10, height _0  (1.702 m), weight 72.4 kg, SpO2 100 %. CVP:  [8 mmHg-14 mmHg] 9 mmHg  Vent Mode: PRVC FiO2 (%):  [30 %] 30 % Set Rate:  [10 bmp] 10 bmp Vt Set:  [400 mL] 400 mL PEEP:  [5 GUY40-34 cmH20] 12 cmH20 Plateau Pressure:  [18 cmH20-26 cmH20] 26 cmH20   Intake/Output Summary (Last 24 hours) at 06/11/2019 0700 Last data filed at 06/23/2019 0600 Gross per 24 hour  Intake 3920.59 ml  Output 4461 ml  Net -540.41 ml   Filed Weights   06/13/2019 0430 06/04/19 0432 06/22/2019 0400  Weight: 72.1 kg 72.3 kg 72.4 kg   Physical Exam: GEN: ill man on vent HEENT: ETT in place, moderate thick secretions CV: Difficult to auscultate, ext warm PULM: Crackles  bases, good compliance on vent GI: Soft, hypoactive BS EXT: Trace anasarca NEURO: Heavily sedated, cannot get to withdrawal to pain PSYCH: RASS -4 SKIN: R femoral and chest ECMO access appear CDI, multiple sticks noted in L groin, some bruising but no palpable hematoma Circuit running 3.7L/min, Pven okay, no clots on inspection per bedside staff   Chest x-ray 11/ 10  reviewed, interval improvement in bilateral interstitial infiltrates, question bilateral effusions CMP benign HgB down a bit this AM to 6.1 from 7.1 yesterday Plts stable 40 WBC stable BAL yeast CXR hardware in good position, bilateral infiltrates, CT showing this is actually pretty impressive dependent atelectasis with reactive effusions  Assessment & Plan:  # Cardiogenic Shock in setting of severe 3V Disease with depressed LV Dysfunction  H/O MI 1998, CAD - tentative plan for salvage CABG today # Acute hypoxic respiratory failure requiring intubation -multifactorial, some combination fluid, HCAP, aspiration, pulmonary hemorrhage.  Improved vent mechanics after therapeutic bronch by [place]. # MAHA- more or less stable, trend H/H, plts, coags, adjustments to anticoagulation per primary # Acute renal failure- on CRRT - If patient not going to OR today, consider decreasing sweep and O2, I can assist with this PRN - Continue empiric antimicrobials and antifungals given low risk/ high reward - Most of care per primary, happy to help post op as well, patient would benefit greatly at some point from proning if that's ever an option  Best Practice:  Diet: npo Pain/Anxiety/Delirium protocol: Fentanyl, Versed infusions VAP protocol: yes DVT prophylaxis: heparin gtt GI prophylaxis: PPI. Glucose control: SSI.  Look okay Mobility: bedrest Code Status: Full. Family Communication: Per primary Disposition: ICU.  Labs   CBC: Recent Labs  Lab 06/16/2019 2308  05/30/19 0550  06/02/19 1150  06/04/19 0217  06/04/19 0757  06/04/19 1640 06/04/19 1847 06/04/19 2129 06/12/2019 0015 06/16/2019 0433 06/20/2019 0606  WBC 19.3*   < > 15.6*   < > 14.9*   < > 9.3  --  9.8  --  8.4  --  5.8  --  6.8  --   NEUTROABS 15.6*  --  12.9*  --  13.6*  --   --   --   --   --   --   --   --   --   --   --   HGB 11.0*   < > 9.0*   < > 8.1*   < > 6.9*   < > 8.3*   < > 8.5* 8.8* 7.1* 7.8* 7.6* 6.1*  HCT 31.7*   < > 26.4*    < > 24.1*   < > 20.3*   < > 24.5*   < > 25.5* 26.0* 21.4* 23.0* 22.1* 18.0*  MCV 88.5   < > 90.1   < > 93.1   < > 90.2  --  85.4  --  87.0  --  87.7  --  86.7  --   PLT 46*   < > 97*   < > 32*   < > 41*  --  82*  --  55*  --  43*  --  40*  --    < > = values in this interval not displayed.   Basic Metabolic Panel: Recent Labs  Lab 05/30/19 1626  05/31/19 0308  05/31/19 1618  06/01/19 0316  06/01/19 1555  06/02/19 0303  06/10/2019 0407  05/28/2019 1013  05/27/2019 1653  06/04/19 0217  06/04/19 1313  06/04/19 1640 06/04/19 1847 06/20/2019 0015  06/22/2019 0433 06/04/2019 0606  NA 138   < > 137   < > 136   < > 137   < > 140   < > 138   < > 137   < >  --    < > 139   < > 136   < >  --    < > 137 139 139 138 138  K 4.7   < > 4.7   < > 4.6   < > 4.6   < > 4.8   < > 4.7   < > 4.7   < >  --    < > 5.2*   < > 5.0   < >  --    < > 5.0 5.0 5.0 4.9 4.7  CL 106  --  107  --  103   < > 105  --  107  --  105   < > 104  --   --   --  107  --  104  --   --   --  107  --   --  105  --   CO2 25  --  24  --  23   < > 24  --  24  --  25   < > 23  --   --   --  23  --  23  --   --   --  22  --   --  23  --   GLUCOSE 152*  --  184*  --  172*   < > 210*  --  176*  --  208*   < > 178*  --   --   --  120*  --  150*  --   --   --  167*  --   --  104*  --   BUN 37*  --  33*  --  31*   < > 32*  --  30*  --  31*   < > 38*  --   --   --  48*  --  45*  --   --   --  46*  --   --  45*  --   CREATININE 1.69*  --  1.45*  --  1.43*   < > 1.35*  --  1.26*  --  1.29*   < > 1.31*  --   --   --  1.61*  --  1.39*  --   --   --  1.37*  --   --  1.29*  --   CALCIUM 6.9*  --  6.8*  --  7.2*   < > 7.2*  --  7.1*  --  7.3*   < > 7.6*  --   --   --  6.9*  --  7.1*  --   --   --  7.1*  --   --  7.3*  --   MG 2.5*  --  2.3  --  2.4  --  2.6*  --  2.4  --   --   --   --   --   --   --   --   --   --   --   --   --   --   --   --   --   --   PHOS 4.5  --  3.6  --  3.4  --   --   --   --   --  3.1  --   --   --  2.9  --   --   --   --   --  4.3   --   --   --   --  2.7  --    < > = values in this interval not displayed.   GFR: Estimated Creatinine Clearance: 50.5 mL/min (A) (by C-G formula based on SCr of 1.29 mg/dL (H)). Recent Labs  Lab 06/20/2019 1653 06/04/19 0217 06/04/19 0757 06/04/19 1640 06/04/19 2129 06/09/2019 0433 06/07/2019 0435  WBC 9.2 9.3 9.8 8.4 5.8 6.8  --   LATICACIDVEN 1.0 1.2  --  1.5  --   --  1.4   Liver Function Tests: Recent Labs  Lab 06/13/2019 0407 06/14/2019 1653 06/04/19 0217 06/04/19 1640 06/23/2019 0433  AST 47* 43* 35 35 29  ALT _0 ALKPHOS 85 61 65 75 71  BILITOT 1.7* 1.6* 1.8* 1.8* 2.4*  PROT 5.0* 3.8* 4.3* 4.4* 4.5*  ALBUMIN 2.1* 1.7* 2.2* 2.4* 2.5*   No results for input(s): LIPASE, AMYLASE in the last 168 hours. No results for input(s): AMMONIA in the last 168 hours. ABG    Component Value Date/Time   PHART 7.555 (H) 05/28/2019 0606   PCO2ART 33.0 06/20/2019 0606   PO2ART 458.0 (H) 06/12/2019 0606   HCO3 29.1 (H) 05/26/2019 0606   TCO2 30 06/21/2019 0606   ACIDBASEDEF 1.0 06/04/2019 0255   O2SAT 100.0 06/24/2019 0606    Coagulation Profile: Recent Labs  Lab 05/28/2019 0407 06/01/2019 1653 06/04/19 0217 06/04/19 1640 06/07/2019 0433  INR 1.4* 1.5* 1.4* 1.4* 1.5*   Cardiac Enzymes: No results for input(s): CKTOTAL, CKMB, CKMBINDEX, TROPONINI in the last 168 hours. HbA1C: Hgb A1c MFr Bld  Date/Time Value Ref Range Status  05/21/2019 11:25 PM 5.5 4.8 - 5.6 % Final    Comment:    (NOTE) Pre diabetes:          5.7%-6.4% Diabetes:              >6.4% Glycemic control for   <7.0% adults with diabetes    CBG: Recent Labs  Lab 06/04/19 1228 06/04/19 1652 06/04/19 2000 06/12/2019 0013 05/31/2019 0425  GLUCAP 118* 139* 118* 123* 84    Independent critical care time 32 minutes  Erskine Emery MD PCCM

## 2019-06-06 ENCOUNTER — Inpatient Hospital Stay (HOSPITAL_COMMUNITY): Payer: PPO | Admitting: Certified Registered Nurse Anesthetist

## 2019-06-06 ENCOUNTER — Inpatient Hospital Stay (HOSPITAL_COMMUNITY): Payer: PPO

## 2019-06-06 ENCOUNTER — Encounter (HOSPITAL_COMMUNITY): Admission: EM | Disposition: E | Payer: Self-pay | Source: Home / Self Care | Attending: Cardiothoracic Surgery

## 2019-06-06 HISTORY — PX: CANNULATION FOR ECMO (EXTRACORPOREAL MEMBRANE OXYGENATION): SHX6796

## 2019-06-06 HISTORY — PX: TEE WITHOUT CARDIOVERSION: SHX5443

## 2019-06-06 HISTORY — PX: CORONARY ARTERY BYPASS GRAFT: SHX141

## 2019-06-06 LAB — POCT I-STAT 7, (LYTES, BLD GAS, ICA,H+H)
Acid-Base Excess: 10 mmol/L — ABNORMAL HIGH (ref 0.0–2.0)
Acid-Base Excess: 6 mmol/L — ABNORMAL HIGH (ref 0.0–2.0)
Acid-Base Excess: 4 mmol/L — ABNORMAL HIGH (ref 0.0–2.0)
Acid-Base Excess: 4 mmol/L — ABNORMAL HIGH (ref 0.0–2.0)
Acid-Base Excess: 2 mmol/L (ref 0.0–2.0)
Acid-Base Excess: 14 mmol/L — ABNORMAL HIGH (ref 0.0–2.0)
Acid-Base Excess: 1 mmol/L (ref 0.0–2.0)
Acid-Base Excess: 8 mmol/L — ABNORMAL HIGH (ref 0.0–2.0)
Acid-Base Excess: 12 mmol/L — ABNORMAL HIGH (ref 0.0–2.0)
Acid-base deficit: 6 mmol/L — ABNORMAL HIGH (ref 0.0–2.0)
Acid-base deficit: 3 mmol/L — ABNORMAL HIGH (ref 0.0–2.0)
Acid-base deficit: 3 mmol/L — ABNORMAL HIGH (ref 0.0–2.0)
Acid-base deficit: 1 mmol/L (ref 0.0–2.0)
Acid-base deficit: 7 mmol/L — ABNORMAL HIGH (ref 0.0–2.0)
Acid-base deficit: 11 mmol/L — ABNORMAL HIGH (ref 0.0–2.0)
Acid-base deficit: 2 mmol/L (ref 0.0–2.0)
Bicarbonate: 37.3 mmol/L — ABNORMAL HIGH (ref 20.0–28.0)
Bicarbonate: 30.8 mmol/L — ABNORMAL HIGH (ref 20.0–28.0)
Bicarbonate: 28.3 mmol/L — ABNORMAL HIGH (ref 20.0–28.0)
Bicarbonate: 28.2 mmol/L — ABNORMAL HIGH (ref 20.0–28.0)
Bicarbonate: 27.8 mmol/L (ref 20.0–28.0)
Bicarbonate: 28.7 mmol/L — ABNORMAL HIGH (ref 20.0–28.0)
Bicarbonate: 20.7 mmol/L (ref 20.0–28.0)
Bicarbonate: 28 mmol/L (ref 20.0–28.0)
Bicarbonate: 25.1 mmol/L (ref 20.0–28.0)
Bicarbonate: 23.3 mmol/L (ref 20.0–28.0)
Bicarbonate: 17.8 mmol/L — ABNORMAL LOW (ref 20.0–28.0)
Bicarbonate: 43 mmol/L — ABNORMAL HIGH (ref 20.0–28.0)
Bicarbonate: 17.1 mmol/L — ABNORMAL LOW (ref 20.0–28.0)
Bicarbonate: 26.8 mmol/L (ref 20.0–28.0)
Bicarbonate: 24.6 mmol/L (ref 20.0–28.0)
Bicarbonate: 26.7 mmol/L (ref 20.0–28.0)
Bicarbonate: 32.7 mmol/L — ABNORMAL HIGH (ref 20.0–28.0)
Bicarbonate: 34.3 mmol/L — ABNORMAL HIGH (ref 20.0–28.0)
Calcium, Ion: 1.19 mmol/L (ref 1.15–1.40)
Calcium, Ion: 0.93 mmol/L — ABNORMAL LOW (ref 1.15–1.40)
Calcium, Ion: 1.07 mmol/L — ABNORMAL LOW (ref 1.15–1.40)
Calcium, Ion: 1.1 mmol/L — ABNORMAL LOW (ref 1.15–1.40)
Calcium, Ion: 0.72 mmol/L — CL (ref 1.15–1.40)
Calcium, Ion: 1 mmol/L — ABNORMAL LOW (ref 1.15–1.40)
Calcium, Ion: 1.29 mmol/L (ref 1.15–1.40)
Calcium, Ion: 1.2 mmol/L (ref 1.15–1.40)
Calcium, Ion: 1.15 mmol/L (ref 1.15–1.40)
Calcium, Ion: 1.11 mmol/L — ABNORMAL LOW (ref 1.15–1.40)
Calcium, Ion: 1.1 mmol/L — ABNORMAL LOW (ref 1.15–1.40)
Calcium, Ion: 1.3 mmol/L (ref 1.15–1.40)
Calcium, Ion: 0.94 mmol/L — ABNORMAL LOW (ref 1.15–1.40)
Calcium, Ion: 0.95 mmol/L — ABNORMAL LOW (ref 1.15–1.40)
Calcium, Ion: 0.98 mmol/L — ABNORMAL LOW (ref 1.15–1.40)
Calcium, Ion: 0.95 mmol/L — ABNORMAL LOW (ref 1.15–1.40)
Calcium, Ion: 1.13 mmol/L — ABNORMAL LOW (ref 1.15–1.40)
Calcium, Ion: 1.11 mmol/L — ABNORMAL LOW (ref 1.15–1.40)
HCT: 36 % — ABNORMAL LOW (ref 39.0–52.0)
HCT: 23 % — ABNORMAL LOW (ref 39.0–52.0)
HCT: 22 % — ABNORMAL LOW (ref 39.0–52.0)
HCT: 26 % — ABNORMAL LOW (ref 39.0–52.0)
HCT: 18 % — ABNORMAL LOW (ref 39.0–52.0)
HCT: 19 % — ABNORMAL LOW (ref 39.0–52.0)
HCT: 24 % — ABNORMAL LOW (ref 39.0–52.0)
HCT: 39 % (ref 39.0–52.0)
HCT: 36 % — ABNORMAL LOW (ref 39.0–52.0)
HCT: 33 % — ABNORMAL LOW (ref 39.0–52.0)
HCT: 34 % — ABNORMAL LOW (ref 39.0–52.0)
HCT: 27 % — ABNORMAL LOW (ref 39.0–52.0)
HCT: 16 % — ABNORMAL LOW (ref 39.0–52.0)
HCT: 18 % — ABNORMAL LOW (ref 39.0–52.0)
HCT: 28 % — ABNORMAL LOW (ref 39.0–52.0)
HCT: 33 % — ABNORMAL LOW (ref 39.0–52.0)
HCT: 33 % — ABNORMAL LOW (ref 39.0–52.0)
HCT: 26 % — ABNORMAL LOW (ref 39.0–52.0)
Hemoglobin: 12.2 g/dL — ABNORMAL LOW (ref 13.0–17.0)
Hemoglobin: 7.8 g/dL — ABNORMAL LOW (ref 13.0–17.0)
Hemoglobin: 7.5 g/dL — ABNORMAL LOW (ref 13.0–17.0)
Hemoglobin: 8.8 g/dL — ABNORMAL LOW (ref 13.0–17.0)
Hemoglobin: 6.1 g/dL — CL (ref 13.0–17.0)
Hemoglobin: 6.5 g/dL — CL (ref 13.0–17.0)
Hemoglobin: 8.2 g/dL — ABNORMAL LOW (ref 13.0–17.0)
Hemoglobin: 13.3 g/dL (ref 13.0–17.0)
Hemoglobin: 12.2 g/dL — ABNORMAL LOW (ref 13.0–17.0)
Hemoglobin: 11.2 g/dL — ABNORMAL LOW (ref 13.0–17.0)
Hemoglobin: 11.6 g/dL — ABNORMAL LOW (ref 13.0–17.0)
Hemoglobin: 9.2 g/dL — ABNORMAL LOW (ref 13.0–17.0)
Hemoglobin: 5.4 g/dL — CL (ref 13.0–17.0)
Hemoglobin: 6.1 g/dL — CL (ref 13.0–17.0)
Hemoglobin: 9.5 g/dL — ABNORMAL LOW (ref 13.0–17.0)
Hemoglobin: 11.2 g/dL — ABNORMAL LOW (ref 13.0–17.0)
Hemoglobin: 11.2 g/dL — ABNORMAL LOW (ref 13.0–17.0)
Hemoglobin: 8.8 g/dL — ABNORMAL LOW (ref 13.0–17.0)
O2 Saturation: 100 %
O2 Saturation: 100 %
O2 Saturation: 100 %
O2 Saturation: 100 %
O2 Saturation: 100 %
O2 Saturation: 100 %
O2 Saturation: 87 %
O2 Saturation: 85 %
O2 Saturation: 93 %
O2 Saturation: 100 %
O2 Saturation: 97 %
O2 Saturation: 78 %
O2 Saturation: 100 %
O2 Saturation: 100 %
O2 Saturation: 100 %
O2 Saturation: 100 %
O2 Saturation: 100 %
O2 Saturation: 100 %
Patient temperature: 37.1
Patient temperature: 37.2
Patient temperature: 35.9
Patient temperature: 35.1
Patient temperature: 36
Patient temperature: 37
Patient temperature: 37
Patient temperature: 36.9
Potassium: 3.9 mmol/L (ref 3.5–5.1)
Potassium: 4.4 mmol/L (ref 3.5–5.1)
Potassium: 4.8 mmol/L (ref 3.5–5.1)
Potassium: 5.1 mmol/L (ref 3.5–5.1)
Potassium: 5.1 mmol/L (ref 3.5–5.1)
Potassium: 5.2 mmol/L — ABNORMAL HIGH (ref 3.5–5.1)
Potassium: 6.1 mmol/L — ABNORMAL HIGH (ref 3.5–5.1)
Potassium: 5.9 mmol/L — ABNORMAL HIGH (ref 3.5–5.1)
Potassium: 5.7 mmol/L — ABNORMAL HIGH (ref 3.5–5.1)
Potassium: 5.3 mmol/L — ABNORMAL HIGH (ref 3.5–5.1)
Potassium: 5.4 mmol/L — ABNORMAL HIGH (ref 3.5–5.1)
Potassium: 6.3 mmol/L (ref 3.5–5.1)
Potassium: 4.9 mmol/L (ref 3.5–5.1)
Potassium: 7.8 mmol/L (ref 3.5–5.1)
Potassium: 5.2 mmol/L — ABNORMAL HIGH (ref 3.5–5.1)
Potassium: 5 mmol/L (ref 3.5–5.1)
Potassium: 4.9 mmol/L (ref 3.5–5.1)
Potassium: 4.3 mmol/L (ref 3.5–5.1)
Sodium: 134 mmol/L — ABNORMAL LOW (ref 135–145)
Sodium: 137 mmol/L (ref 135–145)
Sodium: 140 mmol/L (ref 135–145)
Sodium: 140 mmol/L (ref 135–145)
Sodium: 141 mmol/L (ref 135–145)
Sodium: 143 mmol/L (ref 135–145)
Sodium: 148 mmol/L — ABNORMAL HIGH (ref 135–145)
Sodium: 142 mmol/L (ref 135–145)
Sodium: 142 mmol/L (ref 135–145)
Sodium: 140 mmol/L (ref 135–145)
Sodium: 139 mmol/L (ref 135–145)
Sodium: 157 mmol/L — ABNORMAL HIGH (ref 135–145)
Sodium: 141 mmol/L (ref 135–145)
Sodium: 147 mmol/L — ABNORMAL HIGH (ref 135–145)
Sodium: 144 mmol/L (ref 135–145)
Sodium: 144 mmol/L (ref 135–145)
Sodium: 145 mmol/L (ref 135–145)
Sodium: 145 mmol/L (ref 135–145)
TCO2: 39 mmol/L — ABNORMAL HIGH (ref 22–32)
TCO2: 32 mmol/L (ref 22–32)
TCO2: 30 mmol/L (ref 22–32)
TCO2: 29 mmol/L (ref 22–32)
TCO2: 30 mmol/L (ref 22–32)
TCO2: 30 mmol/L (ref 22–32)
TCO2: 22 mmol/L (ref 22–32)
TCO2: 31 mmol/L (ref 22–32)
TCO2: 27 mmol/L (ref 22–32)
TCO2: 24 mmol/L (ref 22–32)
TCO2: 19 mmol/L — ABNORMAL LOW (ref 22–32)
TCO2: 46 mmol/L — ABNORMAL HIGH (ref 22–32)
TCO2: 19 mmol/L — ABNORMAL LOW (ref 22–32)
TCO2: 29 mmol/L (ref 22–32)
TCO2: 26 mmol/L (ref 22–32)
TCO2: 28 mmol/L (ref 22–32)
TCO2: 34 mmol/L — ABNORMAL HIGH (ref 22–32)
TCO2: 35 mmol/L — ABNORMAL HIGH (ref 22–32)
pCO2 arterial: 65.6 mmHg (ref 32.0–48.0)
pCO2 arterial: 42.8 mmHg (ref 32.0–48.0)
pCO2 arterial: 43.5 mmHg (ref 32.0–48.0)
pCO2 arterial: 39.3 mmHg (ref 32.0–48.0)
pCO2 arterial: 58.5 mmHg — ABNORMAL HIGH (ref 32.0–48.0)
pCO2 arterial: 67.4 mmHg (ref 32.0–48.0)
pCO2 arterial: 43.8 mmHg (ref 32.0–48.0)
pCO2 arterial: 84.9 mmHg (ref 32.0–48.0)
pCO2 arterial: 54.3 mmHg — ABNORMAL HIGH (ref 32.0–48.0)
pCO2 arterial: 34.6 mmHg (ref 32.0–48.0)
pCO2 arterial: 31.8 mmHg — ABNORMAL LOW (ref 32.0–48.0)
pCO2 arterial: 83.9 mmHg (ref 32.0–48.0)
pCO2 arterial: 50.9 mmHg — ABNORMAL HIGH (ref 32.0–48.0)
pCO2 arterial: 59.5 mmHg — ABNORMAL HIGH (ref 32.0–48.0)
pCO2 arterial: 52.4 mmHg — ABNORMAL HIGH (ref 32.0–48.0)
pCO2 arterial: 45.9 mmHg (ref 32.0–48.0)
pCO2 arterial: 47.4 mmHg (ref 32.0–48.0)
pCO2 arterial: 35.2 mmHg (ref 32.0–48.0)
pH, Arterial: 7.362 (ref 7.350–7.450)
pH, Arterial: 7.465 — ABNORMAL HIGH (ref 7.350–7.450)
pH, Arterial: 7.422 (ref 7.350–7.450)
pH, Arterial: 7.464 — ABNORMAL HIGH (ref 7.350–7.450)
pH, Arterial: 7.223 — ABNORMAL LOW (ref 7.350–7.450)
pH, Arterial: 7.299 — ABNORMAL LOW (ref 7.350–7.450)
pH, Arterial: 7.282 — ABNORMAL LOW (ref 7.350–7.450)
pH, Arterial: 7.128 — CL (ref 7.350–7.450)
pH, Arterial: 7.268 — ABNORMAL LOW (ref 7.350–7.450)
pH, Arterial: 7.428 (ref 7.350–7.450)
pH, Arterial: 7.352 (ref 7.350–7.450)
pH, Arterial: 7.318 — ABNORMAL LOW (ref 7.350–7.450)
pH, Arterial: 7.134 — CL (ref 7.350–7.450)
pH, Arterial: 7.261 — ABNORMAL LOW (ref 7.350–7.450)
pH, Arterial: 7.279 — ABNORMAL LOW (ref 7.350–7.450)
pH, Arterial: 7.372 (ref 7.350–7.450)
pH, Arterial: 7.446 (ref 7.350–7.450)
pH, Arterial: 7.597 — ABNORMAL HIGH (ref 7.350–7.450)
pO2, Arterial: 393 mmHg — ABNORMAL HIGH (ref 83.0–108.0)
pO2, Arterial: 309 mmHg — ABNORMAL HIGH (ref 83.0–108.0)
pO2, Arterial: 488 mmHg — ABNORMAL HIGH (ref 83.0–108.0)
pO2, Arterial: 498 mmHg — ABNORMAL HIGH (ref 83.0–108.0)
pO2, Arterial: 298 mmHg — ABNORMAL HIGH (ref 83.0–108.0)
pO2, Arterial: 331 mmHg — ABNORMAL HIGH (ref 83.0–108.0)
pO2, Arterial: 59 mmHg — ABNORMAL LOW (ref 83.0–108.0)
pO2, Arterial: 69 mmHg — ABNORMAL LOW (ref 83.0–108.0)
pO2, Arterial: 75 mmHg — ABNORMAL LOW (ref 83.0–108.0)
pO2, Arterial: 201 mmHg — ABNORMAL HIGH (ref 83.0–108.0)
pO2, Arterial: 85 mmHg (ref 83.0–108.0)
pO2, Arterial: 49 mmHg — ABNORMAL LOW (ref 83.0–108.0)
pO2, Arterial: 307 mmHg — ABNORMAL HIGH (ref 83.0–108.0)
pO2, Arterial: 382 mmHg — ABNORMAL HIGH (ref 83.0–108.0)
pO2, Arterial: 478 mmHg — ABNORMAL HIGH (ref 83.0–108.0)
pO2, Arterial: 466 mmHg — ABNORMAL HIGH (ref 83.0–108.0)
pO2, Arterial: 506 mmHg — ABNORMAL HIGH (ref 83.0–108.0)
pO2, Arterial: 457 mmHg — ABNORMAL HIGH (ref 83.0–108.0)

## 2019-06-06 LAB — PREPARE FRESH FROZEN PLASMA
Unit division: 0
Unit division: 0
Unit division: 0
Unit division: 0
Unit division: 0

## 2019-06-06 LAB — BPAM PLATELET PHERESIS
Blood Product Expiration Date: 202011092359
Blood Product Expiration Date: 202011112359
Blood Product Expiration Date: 202011132359
Blood Product Expiration Date: 202011132359
ISSUE DATE / TIME: 202011091227
ISSUE DATE / TIME: 202011091623
ISSUE DATE / TIME: 202011111422
ISSUE DATE / TIME: 202011111422
Unit Type and Rh: 600
Unit Type and Rh: 7300
Unit Type and Rh: 7300
Unit Type and Rh: 7300

## 2019-06-06 LAB — POCT I-STAT, CHEM 8
BUN: 14 mg/dL (ref 8–23)
BUN: 19 mg/dL (ref 8–23)
BUN: 12 mg/dL (ref 8–23)
BUN: 14 mg/dL (ref 8–23)
BUN: 15 mg/dL (ref 8–23)
BUN: 44 mg/dL — ABNORMAL HIGH (ref 8–23)
BUN: 48 mg/dL — ABNORMAL HIGH (ref 8–23)
BUN: 50 mg/dL — ABNORMAL HIGH (ref 8–23)
BUN: 45 mg/dL — ABNORMAL HIGH (ref 8–23)
BUN: 50 mg/dL — ABNORMAL HIGH (ref 8–23)
BUN: 56 mg/dL — ABNORMAL HIGH (ref 8–23)
BUN: 49 mg/dL — ABNORMAL HIGH (ref 8–23)
BUN: 46 mg/dL — ABNORMAL HIGH (ref 8–23)
BUN: 47 mg/dL — ABNORMAL HIGH (ref 8–23)
BUN: 49 mg/dL — ABNORMAL HIGH (ref 8–23)
BUN: 50 mg/dL — ABNORMAL HIGH (ref 8–23)
BUN: 47 mg/dL — ABNORMAL HIGH (ref 8–23)
Calcium, Ion: 1.14 mmol/L — ABNORMAL LOW (ref 1.15–1.40)
Calcium, Ion: 1.21 mmol/L (ref 1.15–1.40)
Calcium, Ion: 0.93 mmol/L — ABNORMAL LOW (ref 1.15–1.40)
Calcium, Ion: 1.02 mmol/L — ABNORMAL LOW (ref 1.15–1.40)
Calcium, Ion: 1.04 mmol/L — ABNORMAL LOW (ref 1.15–1.40)
Calcium, Ion: 1.09 mmol/L — ABNORMAL LOW (ref 1.15–1.40)
Calcium, Ion: 0.97 mmol/L — ABNORMAL LOW (ref 1.15–1.40)
Calcium, Ion: 1.11 mmol/L — ABNORMAL LOW (ref 1.15–1.40)
Calcium, Ion: 0.96 mmol/L — ABNORMAL LOW (ref 1.15–1.40)
Calcium, Ion: 0.96 mmol/L — ABNORMAL LOW (ref 1.15–1.40)
Calcium, Ion: 1.16 mmol/L (ref 1.15–1.40)
Calcium, Ion: 0.97 mmol/L — ABNORMAL LOW (ref 1.15–1.40)
Calcium, Ion: 0.91 mmol/L — ABNORMAL LOW (ref 1.15–1.40)
Calcium, Ion: 1.59 mmol/L (ref 1.15–1.40)
Calcium, Ion: 0.98 mmol/L — ABNORMAL LOW (ref 1.15–1.40)
Calcium, Ion: 1.08 mmol/L — ABNORMAL LOW (ref 1.15–1.40)
Calcium, Ion: 0.99 mmol/L — ABNORMAL LOW (ref 1.15–1.40)
Chloride: 93 mmol/L — ABNORMAL LOW (ref 98–111)
Chloride: 96 mmol/L — ABNORMAL LOW (ref 98–111)
Chloride: 93 mmol/L — ABNORMAL LOW (ref 98–111)
Chloride: 96 mmol/L — ABNORMAL LOW (ref 98–111)
Chloride: 94 mmol/L — ABNORMAL LOW (ref 98–111)
Chloride: 103 mmol/L (ref 98–111)
Chloride: 103 mmol/L (ref 98–111)
Chloride: 104 mmol/L (ref 98–111)
Chloride: 104 mmol/L (ref 98–111)
Chloride: 104 mmol/L (ref 98–111)
Chloride: 105 mmol/L (ref 98–111)
Chloride: 101 mmol/L (ref 98–111)
Chloride: 102 mmol/L (ref 98–111)
Chloride: 104 mmol/L (ref 98–111)
Chloride: 102 mmol/L (ref 98–111)
Chloride: 103 mmol/L (ref 98–111)
Chloride: 105 mmol/L (ref 98–111)
Creatinine, Ser: 0.5 mg/dL — ABNORMAL LOW (ref 0.61–1.24)
Creatinine, Ser: 0.6 mg/dL — ABNORMAL LOW (ref 0.61–1.24)
Creatinine, Ser: 0.5 mg/dL — ABNORMAL LOW (ref 0.61–1.24)
Creatinine, Ser: 0.5 mg/dL — ABNORMAL LOW (ref 0.61–1.24)
Creatinine, Ser: 0.6 mg/dL — ABNORMAL LOW (ref 0.61–1.24)
Creatinine, Ser: 1.3 mg/dL — ABNORMAL HIGH (ref 0.61–1.24)
Creatinine, Ser: 1.4 mg/dL — ABNORMAL HIGH (ref 0.61–1.24)
Creatinine, Ser: 1.5 mg/dL — ABNORMAL HIGH (ref 0.61–1.24)
Creatinine, Ser: 1.5 mg/dL — ABNORMAL HIGH (ref 0.61–1.24)
Creatinine, Ser: 1.4 mg/dL — ABNORMAL HIGH (ref 0.61–1.24)
Creatinine, Ser: 1.5 mg/dL — ABNORMAL HIGH (ref 0.61–1.24)
Creatinine, Ser: 1.4 mg/dL — ABNORMAL HIGH (ref 0.61–1.24)
Creatinine, Ser: 1.5 mg/dL — ABNORMAL HIGH (ref 0.61–1.24)
Creatinine, Ser: 1.5 mg/dL — ABNORMAL HIGH (ref 0.61–1.24)
Creatinine, Ser: 1.5 mg/dL — ABNORMAL HIGH (ref 0.61–1.24)
Creatinine, Ser: 1.5 mg/dL — ABNORMAL HIGH (ref 0.61–1.24)
Creatinine, Ser: 1.4 mg/dL — ABNORMAL HIGH (ref 0.61–1.24)
Glucose, Bld: 105 mg/dL — ABNORMAL HIGH (ref 70–99)
Glucose, Bld: 108 mg/dL — ABNORMAL HIGH (ref 70–99)
Glucose, Bld: 101 mg/dL — ABNORMAL HIGH (ref 70–99)
Glucose, Bld: 125 mg/dL — ABNORMAL HIGH (ref 70–99)
Glucose, Bld: 147 mg/dL — ABNORMAL HIGH (ref 70–99)
Glucose, Bld: 103 mg/dL — ABNORMAL HIGH (ref 70–99)
Glucose, Bld: 133 mg/dL — ABNORMAL HIGH (ref 70–99)
Glucose, Bld: 92 mg/dL (ref 70–99)
Glucose, Bld: 142 mg/dL — ABNORMAL HIGH (ref 70–99)
Glucose, Bld: 122 mg/dL — ABNORMAL HIGH (ref 70–99)
Glucose, Bld: 135 mg/dL — ABNORMAL HIGH (ref 70–99)
Glucose, Bld: 60 mg/dL — ABNORMAL LOW (ref 70–99)
Glucose, Bld: 114 mg/dL — ABNORMAL HIGH (ref 70–99)
Glucose, Bld: 118 mg/dL — ABNORMAL HIGH (ref 70–99)
Glucose, Bld: 106 mg/dL — ABNORMAL HIGH (ref 70–99)
Glucose, Bld: 130 mg/dL — ABNORMAL HIGH (ref 70–99)
Glucose, Bld: 123 mg/dL — ABNORMAL HIGH (ref 70–99)
HCT: 33 % — ABNORMAL LOW (ref 39.0–52.0)
HCT: 36 % — ABNORMAL LOW (ref 39.0–52.0)
HCT: 25 % — ABNORMAL LOW (ref 39.0–52.0)
HCT: 25 % — ABNORMAL LOW (ref 39.0–52.0)
HCT: 24 % — ABNORMAL LOW (ref 39.0–52.0)
HCT: 20 % — ABNORMAL LOW (ref 39.0–52.0)
HCT: 16 % — ABNORMAL LOW (ref 39.0–52.0)
HCT: 18 % — ABNORMAL LOW (ref 39.0–52.0)
HCT: 21 % — ABNORMAL LOW (ref 39.0–52.0)
HCT: 21 % — ABNORMAL LOW (ref 39.0–52.0)
HCT: 24 % — ABNORMAL LOW (ref 39.0–52.0)
HCT: 19 % — ABNORMAL LOW (ref 39.0–52.0)
HCT: 20 % — ABNORMAL LOW (ref 39.0–52.0)
HCT: 21 % — ABNORMAL LOW (ref 39.0–52.0)
HCT: 23 % — ABNORMAL LOW (ref 39.0–52.0)
HCT: 26 % — ABNORMAL LOW (ref 39.0–52.0)
HCT: 28 % — ABNORMAL LOW (ref 39.0–52.0)
Hemoglobin: 11.2 g/dL — ABNORMAL LOW (ref 13.0–17.0)
Hemoglobin: 12.2 g/dL — ABNORMAL LOW (ref 13.0–17.0)
Hemoglobin: 8.5 g/dL — ABNORMAL LOW (ref 13.0–17.0)
Hemoglobin: 8.5 g/dL — ABNORMAL LOW (ref 13.0–17.0)
Hemoglobin: 8.2 g/dL — ABNORMAL LOW (ref 13.0–17.0)
Hemoglobin: 6.8 g/dL — CL (ref 13.0–17.0)
Hemoglobin: 5.4 g/dL — CL (ref 13.0–17.0)
Hemoglobin: 6.1 g/dL — CL (ref 13.0–17.0)
Hemoglobin: 7.1 g/dL — ABNORMAL LOW (ref 13.0–17.0)
Hemoglobin: 7.1 g/dL — ABNORMAL LOW (ref 13.0–17.0)
Hemoglobin: 8.2 g/dL — ABNORMAL LOW (ref 13.0–17.0)
Hemoglobin: 6.5 g/dL — CL (ref 13.0–17.0)
Hemoglobin: 6.8 g/dL — CL (ref 13.0–17.0)
Hemoglobin: 7.1 g/dL — ABNORMAL LOW (ref 13.0–17.0)
Hemoglobin: 7.8 g/dL — ABNORMAL LOW (ref 13.0–17.0)
Hemoglobin: 8.8 g/dL — ABNORMAL LOW (ref 13.0–17.0)
Hemoglobin: 9.5 g/dL — ABNORMAL LOW (ref 13.0–17.0)
Potassium: 3.6 mmol/L (ref 3.5–5.1)
Potassium: 3.9 mmol/L (ref 3.5–5.1)
Potassium: 4.4 mmol/L (ref 3.5–5.1)
Potassium: 4.7 mmol/L (ref 3.5–5.1)
Potassium: 4.4 mmol/L (ref 3.5–5.1)
Potassium: 4.1 mmol/L (ref 3.5–5.1)
Potassium: 5.1 mmol/L (ref 3.5–5.1)
Potassium: 6.1 mmol/L — ABNORMAL HIGH (ref 3.5–5.1)
Potassium: 5.7 mmol/L — ABNORMAL HIGH (ref 3.5–5.1)
Potassium: 6.6 mmol/L (ref 3.5–5.1)
Potassium: 5.8 mmol/L — ABNORMAL HIGH (ref 3.5–5.1)
Potassium: 4.6 mmol/L (ref 3.5–5.1)
Potassium: 5.1 mmol/L (ref 3.5–5.1)
Potassium: 5.3 mmol/L — ABNORMAL HIGH (ref 3.5–5.1)
Potassium: 5.4 mmol/L — ABNORMAL HIGH (ref 3.5–5.1)
Potassium: 5.5 mmol/L — ABNORMAL HIGH (ref 3.5–5.1)
Potassium: 5.3 mmol/L — ABNORMAL HIGH (ref 3.5–5.1)
Sodium: 135 mmol/L (ref 135–145)
Sodium: 136 mmol/L (ref 135–145)
Sodium: 137 mmol/L (ref 135–145)
Sodium: 138 mmol/L (ref 135–145)
Sodium: 137 mmol/L (ref 135–145)
Sodium: 140 mmol/L (ref 135–145)
Sodium: 139 mmol/L (ref 135–145)
Sodium: 140 mmol/L (ref 135–145)
Sodium: 140 mmol/L (ref 135–145)
Sodium: 140 mmol/L (ref 135–145)
Sodium: 143 mmol/L (ref 135–145)
Sodium: 146 mmol/L — ABNORMAL HIGH (ref 135–145)
Sodium: 145 mmol/L (ref 135–145)
Sodium: 146 mmol/L — ABNORMAL HIGH (ref 135–145)
Sodium: 145 mmol/L (ref 135–145)
Sodium: 145 mmol/L (ref 135–145)
Sodium: 143 mmol/L (ref 135–145)
TCO2: 30 mmol/L (ref 22–32)
TCO2: 35 mmol/L — ABNORMAL HIGH (ref 22–32)
TCO2: 28 mmol/L (ref 22–32)
TCO2: 33 mmol/L — ABNORMAL HIGH (ref 22–32)
TCO2: 33 mmol/L — ABNORMAL HIGH (ref 22–32)
TCO2: 29 mmol/L (ref 22–32)
TCO2: 26 mmol/L (ref 22–32)
TCO2: 27 mmol/L (ref 22–32)
TCO2: 25 mmol/L (ref 22–32)
TCO2: 29 mmol/L (ref 22–32)
TCO2: 26 mmol/L (ref 22–32)
TCO2: 24 mmol/L (ref 22–32)
TCO2: 23 mmol/L (ref 22–32)
TCO2: 25 mmol/L (ref 22–32)
TCO2: 24 mmol/L (ref 22–32)
TCO2: 25 mmol/L (ref 22–32)
TCO2: 25 mmol/L (ref 22–32)

## 2019-06-06 LAB — PREPARE PLATELET PHERESIS
Unit division: 0
Unit division: 0
Unit division: 0
Unit division: 0

## 2019-06-06 LAB — APTT
aPTT: 53 seconds — ABNORMAL HIGH (ref 24–36)
aPTT: 51 seconds — ABNORMAL HIGH (ref 24–36)

## 2019-06-06 LAB — ECHO INTRAOPERATIVE TEE
Height: 67 in
Weight: 2553.81 oz

## 2019-06-06 LAB — BPAM FFP
Blood Product Expiration Date: 202011112359
Blood Product Expiration Date: 202011152359
Blood Product Expiration Date: 202011152359
Blood Product Expiration Date: 202011162359
Blood Product Expiration Date: 202011172359
Blood Product Expiration Date: 202011172359
ISSUE DATE / TIME: 202011111423
ISSUE DATE / TIME: 202011111423
ISSUE DATE / TIME: 202011112141
ISSUE DATE / TIME: 202011112141
ISSUE DATE / TIME: 202011121344
ISSUE DATE / TIME: 202011121344
Unit Type and Rh: 5100
Unit Type and Rh: 6200
Unit Type and Rh: 6200
Unit Type and Rh: 6200
Unit Type and Rh: 6200
Unit Type and Rh: 6200

## 2019-06-06 LAB — POCT I-STAT EG7
Bicarbonate: 26.9 mmol/L (ref 20.0–28.0)
Calcium, Ion: 0.78 mmol/L — CL (ref 1.15–1.40)
HCT: 19 % — ABNORMAL LOW (ref 39.0–52.0)
Hemoglobin: 6.5 g/dL — CL (ref 13.0–17.0)
O2 Saturation: 45 %
Potassium: 5 mmol/L (ref 3.5–5.1)
Sodium: 143 mmol/L (ref 135–145)
TCO2: 29 mmol/L (ref 22–32)
pCO2, Ven: 64.6 mmHg — ABNORMAL HIGH (ref 44.0–60.0)
pH, Ven: 7.228 — ABNORMAL LOW (ref 7.250–7.430)
pO2, Ven: 30 mmHg — CL (ref 32.0–45.0)

## 2019-06-06 LAB — COMPREHENSIVE METABOLIC PANEL
ALT: 32 U/L (ref 0–44)
ALT: 454 U/L — ABNORMAL HIGH (ref 0–44)
AST: 116 U/L — ABNORMAL HIGH (ref 15–41)
AST: 1428 U/L — ABNORMAL HIGH (ref 15–41)
Albumin: 3 g/dL — ABNORMAL LOW (ref 3.5–5.0)
Albumin: 2.2 g/dL — ABNORMAL LOW (ref 3.5–5.0)
Alkaline Phosphatase: 120 U/L (ref 38–126)
Alkaline Phosphatase: 62 U/L (ref 38–126)
Anion gap: 19 — ABNORMAL HIGH (ref 5–15)
Anion gap: 13 (ref 5–15)
BUN: 44 mg/dL — ABNORMAL HIGH (ref 8–23)
BUN: 48 mg/dL — ABNORMAL HIGH (ref 8–23)
CO2: 16 mmol/L — ABNORMAL LOW (ref 22–32)
CO2: 26 mmol/L (ref 22–32)
Calcium: 8.2 mg/dL — ABNORMAL LOW (ref 8.9–10.3)
Calcium: 8.2 mg/dL — ABNORMAL LOW (ref 8.9–10.3)
Chloride: 106 mmol/L (ref 98–111)
Chloride: 106 mmol/L (ref 98–111)
Creatinine, Ser: 1.44 mg/dL — ABNORMAL HIGH (ref 0.61–1.24)
Creatinine, Ser: 1.67 mg/dL — ABNORMAL HIGH (ref 0.61–1.24)
GFR calc Af Amer: 57 mL/min — ABNORMAL LOW (ref >60)
GFR calc Af Amer: 48 mL/min — ABNORMAL LOW (ref >60)
GFR calc non Af Amer: 49 mL/min — ABNORMAL LOW (ref >60)
GFR calc non Af Amer: 41 mL/min — ABNORMAL LOW (ref >60)
Glucose, Bld: 121 mg/dL — ABNORMAL HIGH (ref 70–99)
Glucose, Bld: 116 mg/dL — ABNORMAL HIGH (ref 70–99)
Potassium: 5.5 mmol/L — ABNORMAL HIGH (ref 3.5–5.1)
Potassium: 5 mmol/L (ref 3.5–5.1)
Sodium: 141 mmol/L (ref 135–145)
Sodium: 145 mmol/L (ref 135–145)
Total Bilirubin: 10 mg/dL — ABNORMAL HIGH (ref 0.3–1.2)
Total Bilirubin: 13.9 mg/dL — ABNORMAL HIGH (ref 0.3–1.2)
Total Protein: 5.2 g/dL — ABNORMAL LOW (ref 6.5–8.1)
Total Protein: 3.5 g/dL — ABNORMAL LOW (ref 6.5–8.1)

## 2019-06-06 LAB — GLUCOSE, CAPILLARY
Glucose-Capillary: 117 mg/dL — ABNORMAL HIGH (ref 70–99)
Glucose-Capillary: 137 mg/dL — ABNORMAL HIGH (ref 70–99)
Glucose-Capillary: 89 mg/dL (ref 70–99)
Glucose-Capillary: 90 mg/dL (ref 70–99)
Glucose-Capillary: 91 mg/dL (ref 70–99)
Glucose-Capillary: 92 mg/dL (ref 70–99)

## 2019-06-06 LAB — CBC
HCT: 33.9 % — ABNORMAL LOW (ref 39.0–52.0)
HCT: 36.1 % — ABNORMAL LOW (ref 39.0–52.0)
HCT: 34.8 % — ABNORMAL LOW (ref 39.0–52.0)
Hemoglobin: 11.7 g/dL — ABNORMAL LOW (ref 13.0–17.0)
Hemoglobin: 12.3 g/dL — ABNORMAL LOW (ref 13.0–17.0)
Hemoglobin: 12.2 g/dL — ABNORMAL LOW (ref 13.0–17.0)
MCH: 30.1 pg (ref 26.0–34.0)
MCH: 30.4 pg (ref 26.0–34.0)
MCH: 29.7 pg (ref 26.0–34.0)
MCHC: 34.5 g/dL (ref 30.0–36.0)
MCHC: 34.1 g/dL (ref 30.0–36.0)
MCHC: 35.1 g/dL (ref 30.0–36.0)
MCV: 87.1 fL (ref 80.0–100.0)
MCV: 89.1 fL (ref 80.0–100.0)
MCV: 84.7 fL (ref 80.0–100.0)
Platelets: 22 10*3/uL — CL (ref 150–400)
Platelets: 38 10*3/uL — ABNORMAL LOW (ref 150–400)
Platelets: 43 10*3/uL — ABNORMAL LOW (ref 150–400)
RBC: 3.89 MIL/uL — ABNORMAL LOW (ref 4.22–5.81)
RBC: 4.05 MIL/uL — ABNORMAL LOW (ref 4.22–5.81)
RBC: 4.11 MIL/uL — ABNORMAL LOW (ref 4.22–5.81)
RDW: 16 % — ABNORMAL HIGH (ref 11.5–15.5)
RDW: 14.6 % (ref 11.5–15.5)
RDW: 14.4 % (ref 11.5–15.5)
WBC: 17.9 10*3/uL — ABNORMAL HIGH (ref 4.0–10.5)
WBC: 9.9 10*3/uL (ref 4.0–10.5)
WBC: 7.7 10*3/uL (ref 4.0–10.5)
nRBC: 0.6 % — ABNORMAL HIGH (ref 0.0–0.2)
nRBC: 1.2 % — ABNORMAL HIGH (ref 0.0–0.2)
nRBC: 1.3 % — ABNORMAL HIGH (ref 0.0–0.2)

## 2019-06-06 LAB — BPAM CRYOPRECIPITATE
Blood Product Expiration Date: 202011112200
ISSUE DATE / TIME: 202011111623
Unit Type and Rh: 5100

## 2019-06-06 LAB — PROTIME-INR
INR: 2.1 — ABNORMAL HIGH (ref 0.8–1.2)
INR: 2.8 — ABNORMAL HIGH (ref 0.8–1.2)
INR: 3.1 — ABNORMAL HIGH (ref 0.8–1.2)
Prothrombin Time: 22.8 seconds — ABNORMAL HIGH (ref 11.4–15.2)
Prothrombin Time: 29.1 seconds — ABNORMAL HIGH (ref 11.4–15.2)
Prothrombin Time: 31.8 seconds — ABNORMAL HIGH (ref 11.4–15.2)

## 2019-06-06 LAB — POCT ACTIVATED CLOTTING TIME: Activated Clotting Time: 201 seconds

## 2019-06-06 LAB — LACTIC ACID, PLASMA
Lactic Acid, Venous: 6.7 mmol/L (ref 0.5–1.9)
Lactic Acid, Venous: 3.7 mmol/L (ref 0.5–1.9)

## 2019-06-06 LAB — PREPARE CRYOPRECIPITATE: Unit division: 0

## 2019-06-06 LAB — PHOSPHORUS: Phosphorus: 7.1 mg/dL — ABNORMAL HIGH (ref 2.5–4.6)

## 2019-06-06 LAB — FIBRINOGEN
Fibrinogen: 109 mg/dL — ABNORMAL LOW (ref 210–475)
Fibrinogen: 174 mg/dL — ABNORMAL LOW (ref 210–475)

## 2019-06-06 LAB — HEMOGLOBIN AND HEMATOCRIT, BLOOD
HCT: 23.9 % — ABNORMAL LOW (ref 39.0–52.0)
Hemoglobin: 8 g/dL — ABNORMAL LOW (ref 13.0–17.0)

## 2019-06-06 LAB — COOXEMETRY PANEL
Carboxyhemoglobin: 1.8 % — ABNORMAL HIGH (ref 0.5–1.5)
Methemoglobin: 1.8 % — ABNORMAL HIGH (ref 0.0–1.5)
O2 Saturation: 46.7 %
Total hemoglobin: 11.7 g/dL — ABNORMAL LOW (ref 12.0–16.0)

## 2019-06-06 LAB — SURGICAL PATHOLOGY

## 2019-06-06 LAB — MAGNESIUM
Magnesium: 3.3 mg/dL — ABNORMAL HIGH (ref 1.7–2.4)
Magnesium: 2.9 mg/dL — ABNORMAL HIGH (ref 1.7–2.4)

## 2019-06-06 LAB — PLATELET COUNT: Platelets: 10 10*3/uL — CL (ref 150–400)

## 2019-06-06 SURGERY — CORONARY ARTERY BYPASS GRAFTING (CABG)
Anesthesia: General | Site: Chest

## 2019-06-06 MED ORDER — SODIUM CHLORIDE 0.9 % IV SOLN
INTRAVENOUS | Status: DC | PRN
  Administered 2019-06-06: 750 mg via INTRAVENOUS

## 2019-06-06 MED ORDER — LACTATED RINGERS IV SOLN
INTRAVENOUS | Status: DC | PRN
  Administered 2019-06-06: 07:00:00 via INTRAVENOUS

## 2019-06-06 MED ORDER — LACTATED RINGERS IV BOLUS
1000.0000 mL | Freq: Once | INTRAVENOUS | Status: AC
  Administered 2019-06-06: 1000 mL via INTRAVENOUS

## 2019-06-06 MED ORDER — NON FORMULARY
Status: DC | PRN
  Administered 2019-06-06: 1 mL

## 2019-06-06 MED ORDER — ARTIFICIAL TEARS OPHTHALMIC OINT
TOPICAL_OINTMENT | OPHTHALMIC | Status: DC | PRN
  Administered 2019-06-06: 1 via OPHTHALMIC

## 2019-06-06 MED ORDER — EPINEPHRINE 1 MG/10ML IJ SOSY
PREFILLED_SYRINGE | INTRAMUSCULAR | Status: DC | PRN
  Administered 2019-06-06: .1 mg via INTRAVENOUS
  Administered 2019-06-06: .2 mg via INTRAVENOUS
  Administered 2019-06-06: .1 mg via INTRAVENOUS
  Administered 2019-06-06: .8 mg via INTRAVENOUS

## 2019-06-06 MED ORDER — BACITRACIN ZINC 500 UNIT/GM EX OINT
TOPICAL_OINTMENT | CUTANEOUS | Status: AC
  Filled 2019-06-06: qty 28.35

## 2019-06-06 MED ORDER — VANCOMYCIN HCL 1000 MG IV SOLR
INTRAVENOUS | Status: AC
  Filled 2019-06-06: qty 3000

## 2019-06-06 MED ORDER — FENTANYL CITRATE (PF) 250 MCG/5ML IJ SOLN
INTRAMUSCULAR | Status: AC
  Filled 2019-06-06: qty 25

## 2019-06-06 MED ORDER — PRISMASOL BGK 4/2.5 32-4-2.5 MEQ/L REPLACEMENT SOLN
Status: DC
  Administered 2019-06-06 – 2019-06-08 (×2): via INTRAVENOUS_CENTRAL

## 2019-06-06 MED ORDER — ALBUMIN HUMAN 5 % IV SOLN
INTRAVENOUS | Status: AC
  Administered 2019-06-06: 12.5 g via INTRAVENOUS
  Filled 2019-06-06: qty 500

## 2019-06-06 MED ORDER — SODIUM CHLORIDE 0.9 % FOR CRRT
INTRAVENOUS_CENTRAL | Status: DC | PRN
  Filled 2019-06-06: qty 1000

## 2019-06-06 MED ORDER — PAPAVERINE HCL 30 MG/ML IJ SOLN
INTRAMUSCULAR | Status: DC | PRN
  Administered 2019-06-06: 30 mg via INTRAVENOUS

## 2019-06-06 MED ORDER — SODIUM BICARBONATE 8.4 % IV SOLN
100.0000 meq | Freq: Once | INTRAVENOUS | Status: AC
  Administered 2019-06-06: 100 meq via INTRAVENOUS

## 2019-06-06 MED ORDER — MIDAZOLAM 50MG/50ML (1MG/ML) PREMIX INFUSION: 2.0000 mg/h | INTRAVENOUS | Status: DC

## 2019-06-06 MED ORDER — SODIUM CHLORIDE 0.9 % IV SOLN
INTRAVENOUS | Status: DC | PRN
  Administered 2019-06-06: 2 mg/h via INTRAVENOUS

## 2019-06-06 MED ORDER — CALCIUM CHLORIDE 10 % IV SOLN
1.0000 g | Freq: Once | INTRAVENOUS | Status: AC
  Administered 2019-06-06: 1 g via INTRAVENOUS

## 2019-06-06 MED ORDER — ROCURONIUM BROMIDE 10 MG/ML (PF) SYRINGE
PREFILLED_SYRINGE | INTRAVENOUS | Status: AC
  Filled 2019-06-06: qty 10

## 2019-06-06 MED ORDER — SODIUM CHLORIDE 0.9 % IV SOLN
INTRAVENOUS | Status: DC | PRN
  Administered 2019-06-06: 1.5 g via INTRAVENOUS

## 2019-06-06 MED ORDER — ROCURONIUM BROMIDE 50 MG/5ML IV SOSY
PREFILLED_SYRINGE | INTRAVENOUS | Status: DC | PRN
  Administered 2019-06-06: 40 mg via INTRAVENOUS
  Administered 2019-06-06 (×3): 50 mg via INTRAVENOUS

## 2019-06-06 MED ORDER — ALBUMIN HUMAN 5 % IV SOLN: 12.5000 g | Freq: Once | INTRAVENOUS | Status: AC

## 2019-06-06 MED ORDER — SODIUM CHLORIDE 0.9% IV SOLUTION
Freq: Once | INTRAVENOUS | Status: AC
  Administered 2019-06-07: 17:00:00 via INTRAVENOUS

## 2019-06-06 MED ORDER — SODIUM BICARBONATE 8.4 % IV SOLN
INTRAVENOUS | Status: AC
  Filled 2019-06-06: qty 50

## 2019-06-06 MED ORDER — HEMOSTATIC AGENTS (NO CHARGE) OPTIME
TOPICAL | Status: DC | PRN
  Administered 2019-06-06 (×2): 1 via TOPICAL

## 2019-06-06 MED ORDER — VASOPRESSIN 20 UNIT/ML IV SOLN
INTRAVENOUS | Status: DC | PRN
  Administered 2019-06-06: 4 [IU] via INTRAVENOUS

## 2019-06-06 MED ORDER — VANCOMYCIN HCL 500 MG IV SOLR
500.0000 mg | Freq: Once | INTRAVENOUS | Status: AC
  Administered 2019-06-06: 500 mg via INTRAVENOUS
  Filled 2019-06-06: qty 500

## 2019-06-06 MED ORDER — PLASMA-LYTE 148 IV SOLN
INTRAVENOUS | Status: DC
  Filled 2019-06-06: qty 2.5

## 2019-06-06 MED ORDER — MIDAZOLAM HCL (PF) 10 MG/2ML IJ SOLN
INTRAMUSCULAR | Status: AC
  Filled 2019-06-06: qty 2

## 2019-06-06 MED ORDER — ALBUMIN HUMAN 5 % IV SOLN
INTRAVENOUS | Status: AC
  Administered 2019-06-06: 12.5 g via INTRAVENOUS
  Filled 2019-06-06: qty 250

## 2019-06-06 MED ORDER — BACITRACIN ZINC 500 UNIT/GM EX OINT
TOPICAL_OINTMENT | CUTANEOUS | Status: DC | PRN
  Administered 2019-06-06: 1 via TOPICAL

## 2019-06-06 MED ORDER — MIDAZOLAM 50MG/50ML (1MG/ML) PREMIX INFUSION
0.0000 mg/h | INTRAVENOUS | Status: DC
  Administered 2019-06-06: 4 mg/h via INTRAVENOUS
  Administered 2019-06-07: 8 mg/h via INTRAVENOUS
  Administered 2019-06-07: 6 mg/h via INTRAVENOUS
  Administered 2019-06-08: 8 mg/h via INTRAVENOUS
  Administered 2019-06-08: 6 mg/h via INTRAVENOUS
  Filled 2019-06-06 (×5): qty 50

## 2019-06-06 MED ORDER — PROTAMINE SULFATE 10 MG/ML IV SOLN
INTRAVENOUS | Status: DC | PRN
  Administered 2019-06-06 (×2): 50 mg via INTRAVENOUS

## 2019-06-06 MED ORDER — ARTIFICIAL TEARS OPHTHALMIC OINT
TOPICAL_OINTMENT | OPHTHALMIC | Status: AC
  Filled 2019-06-06: qty 3.5

## 2019-06-06 MED ORDER — STERILE WATER FOR INJECTION IJ SOLN
INTRAMUSCULAR | Status: AC
  Filled 2019-06-06: qty 10

## 2019-06-06 MED ORDER — EPINEPHRINE PF 1 MG/ML IJ SOLN: INTRAMUSCULAR | Status: DC | PRN

## 2019-06-06 MED ORDER — SODIUM CHLORIDE (PF) 0.9 % IJ SOLN
OROMUCOSAL | Status: DC | PRN
  Administered 2019-06-06 (×3): 4 mL via TOPICAL

## 2019-06-06 MED ORDER — 0.9 % SODIUM CHLORIDE (POUR BTL) OPTIME
TOPICAL | Status: DC | PRN
  Administered 2019-06-06: 07:00:00 5000 mL

## 2019-06-06 MED ORDER — SODIUM CHLORIDE 0.9 % IV SOLN
INTRAVENOUS | Status: AC
  Filled 2019-06-06: qty 1.2

## 2019-06-06 MED ORDER — HEPARIN (PORCINE) 25000 UT/250ML-% IV SOLN
500.0000 [IU]/h | INTRAVENOUS | Status: DC
  Administered 2019-06-06: 500 [IU]/h via INTRAVENOUS
  Filled 2019-06-06: qty 250

## 2019-06-06 MED ORDER — PROPOFOL 10 MG/ML IV BOLUS
INTRAVENOUS | Status: AC
  Filled 2019-06-06: qty 20

## 2019-06-06 MED ORDER — VANCOMYCIN VARIABLE DOSE PER UNSTABLE RENAL FUNCTION (PHARMACIST DOSING): Status: DC

## 2019-06-06 MED ORDER — SODIUM CHLORIDE 0.9 % IV SOLN
INTRAVENOUS | Status: DC | PRN
  Administered 2019-06-06: 500 mL

## 2019-06-06 MED ORDER — ATORVASTATIN CALCIUM 40 MG PO TABS: 40.0000 mg | ORAL_TABLET | Freq: Every day | ORAL | Status: DC

## 2019-06-06 MED ORDER — SODIUM CHLORIDE 0.9 % IV SOLN
8.0000 mg/h | INTRAVENOUS | Status: DC
  Administered 2019-06-06 – 2019-06-07 (×3): 8 mg/h via INTRAVENOUS
  Filled 2019-06-06 (×6): qty 80

## 2019-06-06 MED ORDER — PRISMASOL BGK 4/2.5 32-4-2.5 MEQ/L IV SOLN
INTRAVENOUS | Status: DC
  Administered 2019-06-06 – 2019-06-08 (×6): via INTRAVENOUS_CENTRAL
  Administered 2019-06-08: 1800 mL/h via INTRAVENOUS_CENTRAL

## 2019-06-06 MED ORDER — PROTAMINE SULFATE 10 MG/ML IV SOLN
INTRAVENOUS | Status: AC
  Filled 2019-06-06: qty 5

## 2019-06-06 MED ORDER — PRISMASOL BGK 4/2.5 32-4-2.5 MEQ/L REPLACEMENT SOLN
Status: DC
  Administered 2019-06-06 – 2019-06-07 (×2): via INTRAVENOUS_CENTRAL
  Administered 2019-06-08: 400 mL via INTRAVENOUS_CENTRAL

## 2019-06-06 MED ORDER — HEPARIN SODIUM (PORCINE) 1000 UNIT/ML IJ SOLN
INTRAMUSCULAR | Status: AC
  Filled 2019-06-06: qty 1

## 2019-06-06 MED ORDER — LACTATED RINGERS IV SOLN
INTRAVENOUS | Status: DC | PRN
  Administered 2019-06-06 (×2): via INTRAVENOUS

## 2019-06-06 MED ORDER — INSULIN REGULAR(HUMAN) IN NACL 100-0.9 UT/100ML-% IV SOLN: INTRAVENOUS | Status: DC

## 2019-06-06 MED ORDER — ALBUTEROL SULFATE HFA 108 (90 BASE) MCG/ACT IN AERS
INHALATION_SPRAY | RESPIRATORY_TRACT | Status: AC
  Filled 2019-06-06: qty 6.7

## 2019-06-06 MED ORDER — INSULIN REGULAR(HUMAN) IN NACL 100-0.9 UT/100ML-% IV SOLN
INTRAVENOUS | Status: DC | PRN
  Administered 2019-06-06: 1.2 [IU]/h via INTRAVENOUS

## 2019-06-06 MED ORDER — HEPARIN SODIUM (PORCINE) 1000 UNIT/ML DIALYSIS
1000.0000 [IU] | INTRAMUSCULAR | Status: DC | PRN
  Filled 2019-06-06 (×2): qty 6

## 2019-06-06 MED ORDER — SODIUM BICARBONATE 8.4 % IV SOLN
INTRAVENOUS | Status: DC | PRN
  Administered 2019-06-06 (×2): 50 meq via INTRAVENOUS

## 2019-06-06 MED ORDER — CALCIUM CHLORIDE 10 % IV SOLN
INTRAVENOUS | Status: AC
  Filled 2019-06-06: qty 10

## 2019-06-06 MED ORDER — DOCUSATE SODIUM 50 MG/5ML PO LIQD
200.0000 mg | Freq: Every day | ORAL | Status: DC
  Administered 2019-06-07 – 2019-06-08 (×2): 200 mg
  Filled 2019-06-06 (×2): qty 20

## 2019-06-06 MED ORDER — HEPARIN SODIUM (PORCINE) 1000 UNIT/ML IJ SOLN
INTRAMUSCULAR | Status: DC | PRN
  Administered 2019-06-06: 30000 [IU] via INTRAVENOUS

## 2019-06-06 MED ORDER — PAPAVERINE HCL 30 MG/ML IJ SOLN
INTRAMUSCULAR | Status: AC
  Filled 2019-06-06: qty 2

## 2019-06-06 MED ORDER — SODIUM BICARBONATE 8.4 % IV SOLN
INTRAVENOUS | Status: AC
  Filled 2019-06-06: qty 200

## 2019-06-06 SURGICAL SUPPLY — 109 items
ADAPTER CARDIO PERF ANTE/RETRO (ADAPTER) ×3 IMPLANT
ADH SKN CLS APL DERMABOND .7 (GAUZE/BANDAGES/DRESSINGS) ×2
ADPR PRFSN 84XANTGRD RTRGD (ADAPTER) ×2
APL SKNCLS STERI-STRIP NONHPOA (GAUZE/BANDAGES/DRESSINGS) ×2
BAG DECANTER FOR FLEXI CONT (MISCELLANEOUS) ×3 IMPLANT
BANDAGE ESMARK 6X9 LF (GAUZE/BANDAGES/DRESSINGS) ×2 IMPLANT
BASKET HEART (ORDER IN 25'S) (MISCELLANEOUS) ×1
BASKET HEART (ORDER IN 25S) (MISCELLANEOUS) ×2 IMPLANT
BENZOIN TINCTURE PRP APPL 2/3 (GAUZE/BANDAGES/DRESSINGS) ×6 IMPLANT
BLADE CLIPPER SURG (BLADE) ×3 IMPLANT
BLADE STERNUM SYSTEM 6 (BLADE) ×3 IMPLANT
BLOOD HAEMOCONCENTR 700 MIDI (MISCELLANEOUS) ×3 IMPLANT
BNDG CMPR 9X6 STRL LF SNTH (GAUZE/BANDAGES/DRESSINGS) ×2
BNDG ELASTIC 4X5.8 VLCR STR LF (GAUZE/BANDAGES/DRESSINGS) IMPLANT
BNDG ELASTIC 6X5.8 VLCR STR LF (GAUZE/BANDAGES/DRESSINGS) IMPLANT
BNDG ESMARK 6X9 LF (GAUZE/BANDAGES/DRESSINGS) ×3
BNDG GAUZE ELAST 4 BULKY (GAUZE/BANDAGES/DRESSINGS) ×3 IMPLANT
CANISTER SUCT 3000ML PPV (MISCELLANEOUS) ×3 IMPLANT
CANNULA NON VENT 18FR 12 (CANNULA) ×3 IMPLANT
CANNULA NON VENT 22FR 12 (CANNULA) ×3 IMPLANT
CATH CPB KIT HENDRICKSON (MISCELLANEOUS) ×3 IMPLANT
CATH HEART VENT LEFT (CATHETERS) ×2 IMPLANT
CATH ROBINSON RED A/P 18FR (CATHETERS) ×6 IMPLANT
CLIP RETRACTION 3.0MM CORONARY (MISCELLANEOUS) IMPLANT
CLIP VESOCCLUDE LG 6/CT (CLIP) ×3 IMPLANT
CLIP VESOCCLUDE SM WIDE 24/CT (CLIP) ×3 IMPLANT
CONN 3/8X1/2 ST GISH (MISCELLANEOUS) ×3 IMPLANT
CONN ST 1/4X3/8  BEN (MISCELLANEOUS) ×1
CONN ST 1/4X3/8 BEN (MISCELLANEOUS) ×2 IMPLANT
CONN Y 3/8X3/8X3/8  BEN (MISCELLANEOUS) ×1
CONN Y 3/8X3/8X3/8 BEN (MISCELLANEOUS) ×2 IMPLANT
COVER WAND RF STERILE (DRAPES) ×3 IMPLANT
DERMABOND ADVANCED (GAUZE/BANDAGES/DRESSINGS) ×1
DERMABOND ADVANCED .7 DNX12 (GAUZE/BANDAGES/DRESSINGS) ×2 IMPLANT
DRAIN CHANNEL 28F RND 3/8 FF (WOUND CARE) ×9 IMPLANT
DRAPE CARDIOVASCULAR INCISE (DRAPES) ×3
DRAPE SLUSH/WARMER DISC (DRAPES) ×3 IMPLANT
DRAPE SRG 135X102X78XABS (DRAPES) ×2 IMPLANT
DRSG AQUACEL AG ADV 3.5X14 (GAUZE/BANDAGES/DRESSINGS) ×3 IMPLANT
ELECT CAUTERY BLADE 6.4 (BLADE) ×3 IMPLANT
ELECT REM PT RETURN 9FT ADLT (ELECTROSURGICAL) ×6
ELECTRODE REM PT RTRN 9FT ADLT (ELECTROSURGICAL) ×4 IMPLANT
FELT TEFLON 1X6 (MISCELLANEOUS) ×6 IMPLANT
GAUZE SPONGE 4X4 12PLY STRL (GAUZE/BANDAGES/DRESSINGS) ×6 IMPLANT
GAUZE SPONGE 4X4 12PLY STRL LF (GAUZE/BANDAGES/DRESSINGS) ×3 IMPLANT
GLOVE BIO SURGEON STRL SZ 6 (GLOVE) ×6 IMPLANT
GLOVE BIO SURGEON STRL SZ 6.5 (GLOVE) ×9 IMPLANT
GLOVE BIOGEL PI IND STRL 6 (GLOVE) ×2 IMPLANT
GLOVE BIOGEL PI INDICATOR 6 (GLOVE) ×1
GLOVE NEODERM STRL 7.5 LF PF (GLOVE) ×6 IMPLANT
GLOVE SURG NEODERM 7.5  LF PF (GLOVE) ×3
GOWN STRL REUS W/ TWL LRG LVL3 (GOWN DISPOSABLE) ×8 IMPLANT
GOWN STRL REUS W/TWL LRG LVL3 (GOWN DISPOSABLE) ×12
HEMOSTAT POWDER SURGIFOAM 1G (HEMOSTASIS) ×9 IMPLANT
HEMOSTAT SURGICEL 2X14 (HEMOSTASIS) ×3 IMPLANT
KIT BASIN OR (CUSTOM PROCEDURE TRAY) ×3 IMPLANT
KIT DRAINAGE VACCUM ASSIST (KITS) ×3 IMPLANT
KIT SUCTION CATH 14FR (SUCTIONS) ×3 IMPLANT
KIT TURNOVER KIT B (KITS) ×3 IMPLANT
KIT VASOVIEW HEMOPRO 2 VH 4000 (KITS) ×3 IMPLANT
LEAD PACING MYOCARDI (MISCELLANEOUS) ×3 IMPLANT
MARKER GRAFT CORONARY BYPASS (MISCELLANEOUS) ×9 IMPLANT
NEEDLE 18GX1X1/2 (RX/OR ONLY) (NEEDLE) ×3 IMPLANT
NS IRRIG 1000ML POUR BTL (IV SOLUTION) ×15 IMPLANT
PACK E OPEN HEART (SUTURE) ×3 IMPLANT
PACK OPEN HEART (CUSTOM PROCEDURE TRAY) ×3 IMPLANT
PACK SPY-PHI (KITS) IMPLANT
PAD ARMBOARD 7.5X6 YLW CONV (MISCELLANEOUS) ×6 IMPLANT
PAD ELECT DEFIB RADIOL ZOLL (MISCELLANEOUS) ×3 IMPLANT
PENCIL BUTTON HOLSTER BLD 10FT (ELECTRODE) ×3 IMPLANT
POSITIONER HEAD DONUT 9IN (MISCELLANEOUS) ×3 IMPLANT
SEALANT SURG COSEAL 8ML (VASCULAR PRODUCTS) ×3 IMPLANT
SET CANNULATION TOURNIQUET (MISCELLANEOUS) ×3 IMPLANT
SET CARDIOPLEGIA MPS 5001102 (MISCELLANEOUS) ×3 IMPLANT
SUT BONE WAX W31G (SUTURE) ×3 IMPLANT
SUT ETHIBOND X763 2 0 SH 1 (SUTURE) ×6 IMPLANT
SUT MNCRL AB 3-0 PS2 18 (SUTURE) ×6 IMPLANT
SUT PDS AB 1 CTX 36 (SUTURE) ×6 IMPLANT
SUT PROLENE 2 0 MH 48 (SUTURE) ×6 IMPLANT
SUT PROLENE 3 0 SH DA (SUTURE) ×6 IMPLANT
SUT PROLENE 4 0 RB 1 (SUTURE) ×3
SUT PROLENE 4 0 SH DA (SUTURE) ×3 IMPLANT
SUT PROLENE 4-0 RB1 .5 CRCL 36 (SUTURE) ×2 IMPLANT
SUT PROLENE 5 0 C 1 36 (SUTURE) IMPLANT
SUT PROLENE 6 0 C 1 30 (SUTURE) ×9 IMPLANT
SUT PROLENE 8 0 BV175 6 (SUTURE) IMPLANT
SUT PROLENE BLUE 7 0 (SUTURE) ×3 IMPLANT
SUT SILK  1 MH (SUTURE) ×8
SUT SILK 1 MH (SUTURE) ×16 IMPLANT
SUT SILK 2 0 SH CR/8 (SUTURE) IMPLANT
SUT SILK 3 0 SH CR/8 (SUTURE) IMPLANT
SUT STEEL 6MS V (SUTURE) ×3 IMPLANT
SUT STEEL SZ 6 DBL 3X14 BALL (SUTURE) ×3 IMPLANT
SUT VIC AB 2-0 CTX 27 (SUTURE) IMPLANT
SUT VIC AB 3-0 X1 27 (SUTURE) IMPLANT
SYR 10ML LL (SYRINGE) IMPLANT
SYR 30ML LL (SYRINGE) ×3 IMPLANT
SYR 3ML LL SCALE MARK (SYRINGE) ×3 IMPLANT
SYSTEM SAHARA CHEST DRAIN ATS (WOUND CARE) IMPLANT
TAPE CLOTH SURG 4X10 WHT LF (GAUZE/BANDAGES/DRESSINGS) ×3 IMPLANT
TOWEL GREEN STERILE (TOWEL DISPOSABLE) ×3 IMPLANT
TOWEL GREEN STERILE FF (TOWEL DISPOSABLE) ×3 IMPLANT
TRAY FOLEY SLVR 16FR TEMP STAT (SET/KITS/TRAYS/PACK) IMPLANT
TUBE CONNECTING 12X1/4 (SUCTIONS) ×6 IMPLANT
TUBING LAP HI FLOW INSUFFLATIO (TUBING) IMPLANT
UNDERPAD 30X30 (UNDERPADS AND DIAPERS) ×3 IMPLANT
VENT LEFT HEART 12002 (CATHETERS) ×3
WATER STERILE IRR 1000ML POUR (IV SOLUTION) ×6 IMPLANT
WATER STERILE IRR 1000ML UROMA (IV SOLUTION) ×3 IMPLANT

## 2019-06-06 NOTE — Consult Note (Signed)
Referring Provider:  Dr. [name]  Primary Care Physician:  Alroy Dust, Carlean Jews.Marlou Sa, MD Primary Gastroenterologist: [facility] primary  Reason for Consultation: Rule out GI bleed  HPI: Raymond Chavez is a 68 y.o. male who is  severely ill with multiple medical issues.  Patient underwent left heart cath in October 23 and was found to have severe coronary artery disease and cardiogenic shock.  Subsequently develop possible aspiration.  Developed PEA and requiring CPR.ECMO was initiated on May 29, 2019.  Underwent CABG x4 with bioprosthetic aortic valve with other intervention on June 05, 2019.  Remains intubated and sedated.  He was found to have dark-colored stool in a Flexi-Seal.  GI is consulted for further evaluation.  His platelet counts are 10.  Hemoglobin 8.  Patient seen and examined at bedside.  Discussed with RN as well as Dr. [name] at bedside.  Past Medical History:  Diagnosis Date  . Acute respiratory failure with hypoxemia ([place]) 04/2019  . Carotid artery occlusion   . Hypertension     Past Surgical History:  Procedure Laterality Date  . CANNULATION FOR ECMO (EXTRACORPOREAL MEMBRANE OXYGENATION) N/A 06/17/2019   Procedure: CANNULATION FOR ECMO (EXTRACORPOREAL MEMBRANE OXYGENATION);  Surgeon: Wonda Olds, MD;  Location: [place];  Service: Open Heart Surgery;  Laterality: N/A;  PARTIAL STERNOTOMY  . CANNULATION FOR ECMO (EXTRACORPOREAL MEMBRANE OXYGENATION)  06/09/2019   Procedure: INSERTION OF LEFT VENTRICLE VENT FOR ECMO;  Surgeon: Wonda Olds, MD;  Location: [facility];  Service: Open Heart Surgery;;  . CHEST TUBE INSERTION  06/10/2019   Procedure: Chest Tube Insertion;  Surgeon: Wonda Olds, MD;  Location: [facility];  Service: Open Heart Surgery;;  . GROIN DISSECTION  04/27/2019   Procedure: right femoral artery repair and left heart catheterization;  Surgeon: Wonda Olds, MD;  Location: [place];  Service: Open Heart Surgery;;  . INSERTION OF IMPLANTABLE LEFT VENTRICULAR  ASSIST DEVICE N/A 04/27/2019   Procedure: INSERTION OF IMPELLA 5.0 IMPLANTABLE LEFT VENTRICULAR ASSIST DEVICE THROUGH RIGHT AXILLARY; REMOVAL OF RIGHT GROIN IMPELLA CP;  Surgeon: Wonda Olds, MD;  Location: [place];  Service: Open Heart Surgery;  Laterality: N/A;  . REMOVAL OF IMPELLA LEFT VENTRICULAR ASSIST DEVICE N/A 05/27/2019   Procedure: REMOVAL OF IMPELLA LEFT VENTRICULAR ASSIST DEVICE;  Surgeon: Wonda Olds, MD;  Location: [place];  Service: Open Heart Surgery;  Laterality: N/A;  . RIGHT/LEFT HEART CATH AND CORONARY ANGIOGRAPHY N/A 05/10/2019   Procedure: RIGHT/LEFT HEART CATH AND CORONARY ANGIOGRAPHY;  Surgeon: Larey Dresser, MD;  Location: [place] [facility];  Service: Cardiovascular;  Laterality: N/A;  . TEE WITHOUT CARDIOVERSION N/A 06/13/2019   Procedure: TRANSESOPHAGEAL ECHOCARDIOGRAM (TEE);  Surgeon: Wonda Olds, MD;  Location: [place];  Service: Open Heart Surgery;  Laterality: N/A;  . TEE WITHOUT CARDIOVERSION N/A 06/16/2019   Procedure: TRANSESOPHAGEAL ECHOCARDIOGRAM (TEE);  Surgeon: Wonda Olds, MD;  Location: [place];  Service: Open Heart Surgery;  Laterality: N/A;  . VENTRICULAR ASSIST DEVICE INSERTION N/A 05/21/2019   Procedure: VENTRICULAR ASSIST DEVICE INSERTION;  Surgeon: Larey Dresser, MD;  Location: [place] [facility];  Service: Cardiovascular;  Laterality: N/A;    Prior to Admission medications   Medication Sig Start Date End Date Taking? Authorizing Provider  albuterol (PROAIR HFA) 108 (90 Base) MCG/ACT inhaler Inhale 2 puffs into the lungs every 4 (four) hours as needed for wheezing or shortness of breath.    Yes [provider]  amLODipine (NORVASC) 5 MG tablet Take 5 mg  by mouth daily. 12/04/17  Yes [provider]  aspirin EC 81 MG tablet Take 81 mg by mouth daily.   Yes [provider]  atorvastatin (LIPITOR) 40 MG tablet Take 40 mg by mouth at bedtime. 03/06/19  Yes [provider]  Azelastine HCl 137 MCG/SPRAY  SOLN Place 1 spray into both nostrils 2 (two) times daily as needed (for allergic symptoms or rhinitis).  03/28/19  Yes [provider]  predniSONE (DELTASONE) 20 MG tablet Take 20-60 mg by mouth See admin instructions. Take 60 mg by mouth once a day for 3 days, 40 mg once a day for 3 days, then 20 mg once a day for 3 days 05/15/19  Yes [provider]  SPIRIVA RESPIMAT 1.25 MCG/ACT AERS Inhale 2 puffs into the lungs daily. 04/23/19  Yes [provider]  hydrochlorothiazide (HYDRODIURIL) 12.5 MG tablet Take 12.5 mg by mouth daily.    [provider]  lisinopril (PRINIVIL,ZESTRIL) 20 MG tablet Take 1 tablet (20 mg total) by mouth daily. Patient not taking: Reported on 05/12/2019 11/28/13   Liam Graham, PA-C    Scheduled Meds: . acetaminophen  1,000 mg Oral Q6H   Or  . acetaminophen (TYLENOL) oral liquid 160 mg/5 mL  1,000 mg Per Tube Q6H  . acetaminophen (TYLENOL) oral liquid 160 mg/5 mL  650 mg Per Tube Once   Or  . acetaminophen  650 mg Rectal Once  . arformoterol  15 mcg Nebulization BID  . [START ON 06/07/2019] atorvastatin  40 mg Oral q1800  . B-complex with vitamin C  1 tablet Per Tube Daily  . bisacodyl  10 mg Oral Daily   Or  . bisacodyl  10 mg Rectal Daily  . chlorhexidine gluconate (MEDLINE KIT)  15 mL Mouth Rinse BID  . Chlorhexidine Gluconate Cloth  6 each Topical Daily  . docusate sodium  200 mg Oral Daily  . feeding supplement (PRO-STAT SUGAR FREE 64)  30 mL Per Tube QID  . guaiFENesin  30 mL Per Tube BID  . insulin aspart  0-24 Units Subcutaneous Q4H  . mouth rinse  15 mL Mouth Rinse 10 times per day  . mupirocin ointment   Nasal BID  . pantoprazole  40 mg Intravenous Q12H  . polyvinyl alcohol  1 drop Both Eyes QID  . sodium chloride flush  3 mL Intravenous Q12H  . [START ON 06/07/2019] umeclidinium bromide  2 puff Inhalation Daily   Continuous Infusions: . sodium chloride    . sodium chloride    . sodium chloride    . albumin  human 12.5 g (06/15/2019 2057)  . dexmedetomidine (PRECEDEX) IV infusion 0.7 mcg/kg/hr (06/15/2019 1037)  . DOPamine 2.5 mcg/kg/min (05/26/2019 2000)  . EPINEPHrine 4 mg in dextrose 5% 250 mL infusion (16 mcg/mL) 3 mcg/min (05/26/2019 1054)  . famotidine (PEPCID) IV    . feeding supplement (VITAL 1.5 CAL)    . fentaNYL infusion INTRAVENOUS Stopped (06/20/2019 0734)  . fluconazole (DIFLUCAN) IV    . lactated ringers 20 mL/hr at 06/20/2019 2331  . lactated ringers 20 mL/hr at 06/24/2019 2000  . meropenem (MERREM) IV 1 g (06/19/2019 0020)  . milrinone 0.125 mcg/kg/min (06/18/2019 1042)  . nitroGLYCERIN    . norepinephrine (LEVOPHED) Adult infusion 20 mcg/min (06/21/2019 1149)  . phenylephrine (NEO-SYNEPHRINE) Adult infusion Stopped (06/04/2019 1734)  . prismasol BGK 4/2.5 1,800 mL/hr at 05/30/2019 0630  . vancomycin 750 mg (06/04/19 1112)  . vasopressin (PITRESSIN) infusion - *FOR SHOCK* 0.04 Units/min (  05/31/2019 0800)   PRN Meds:.sodium chloride, albumin human, docusate, heparin, influenza vaccine adjuvanted, metoprolol tartrate, midazolam, morphine injection, ondansetron (ZOFRAN) IV, oxyCODONE, pneumococcal 23 valent vaccine, sodium chloride flush, traMADol  Allergies as of 05/03/2019  . (No Known Allergies)    History reviewed. No pertinent family history.  Social History   Socioeconomic History  . Marital status: Divorced    Spouse name: Not on file  . Number of children: 2  . Years of education: Not on file  . Highest education level: Not on file  Occupational History  . Not on file  Social Needs  . Financial resource strain: Not on file  . Food insecurity    Worry: Not on file    Inability: Not on file  . Transportation needs    Medical: Not on file    Non-medical: Not on file  Tobacco Use  . Smoking status: Light Tobacco Smoker    Packs/day: 0.00    Years: 40.00    Pack years: 0.00    Types: Cigarettes    Last attempt to quit: 10/23/2017    Years since quitting: 1.6  . Smokeless  tobacco: Never Used  Substance and Sexual Activity  . Alcohol use: Yes    Alcohol/week: 6.0 - 12.0 standard drinks    Types: 6 - 12 Cans of beer per week  . Drug use: No  . Sexual activity: Yes  Lifestyle  . Physical activity    Days per week: Not on file    Minutes per session: Not on file  . Stress: Not on file  Relationships  . Social Herbalist on phone: Not on file    Gets together: Not on file    Attends religious service: Not on file    Active member of club or organization: Not on file    Attends meetings of clubs or organizations: Not on file    Relationship status: Not on file  . Intimate partner violence    Fear of current or ex partner: Not on file    Emotionally abused: Not on file    Physically abused: Not on file    Forced sexual activity: Not on file  Other Topics Concern  . Not on file  Social History Narrative  . Not on file    Review of Systems: Not able to obtain  Physical Exam: Vital signs: Vitals:   06/13/2019 1201 06/04/2019 1202  BP:    Pulse: 90   Resp: (!) 25   Temp:    SpO2: 100% 100%   Last BM Date: 06/04/19   General.  Intubated and sedated.  On mechanical ventilation Multiple tubes throughout the body noted.  OG tube has a green liquid.  Rectal  tube has dark liquid. Abdomen is minimally distended, not able to appreciate tenderness as patient is not responding, bowel sounds are present.  GI:  Lab Results: Recent Labs    06/22/2019 0433  06/01/2019 1811  06/20/2019 0442  05/26/2019 0855  06/23/2019 1026 06/01/2019 1116 06/24/2019 1121  WBC 6.8  --  17.6*  --  17.9*  --   --   --   --   --   --   HGB 7.6*   < > 13.6   < > 11.7*   < > 8.0*   < > 8.8* 9.5* 9.5*  HCT 22.1*   < > 40.5   < > 33.9*   < > 23.9*   < > 26.0* 28.0* 28.0*  PLT 40*   < > 79*  --  22*  --  10*  --   --   --   --    < > = values in this interval not displayed.   BMET Recent Labs    06/15/2019 0433  05/28/2019 1938  06/04/2019 0506  06/11/2019 0934 06/07/2019 1026  06/02/2019 1116 06/19/2019 1121  NA 138   < > 140   < > 141   < > 145 145 144 143  K 4.9   < > 5.6*   < > 5.5*   < > 5.4* 5.5* 5.2* 5.3*  CL 105   < > 107  --  106   < > 103 102  --  105  CO2 23  --  20*  --  16*  --   --   --   --   --   GLUCOSE 104*   < > 107*  --  121*   < > 106* 130*  --  123*  BUN 45*   < > 52*  --  44*   < > 49* 50*  --  47*  CREATININE 1.29*   < > 1.37*  --  1.44*   < > 1.50* 1.50*  --  1.40*  CALCIUM 7.3*  --  7.9*  --  8.2*  --   --   --   --   --    < > = values in this interval not displayed.   LFT Recent Labs    06/02/2019 0506  PROT 5.2*  ALBUMIN 3.0*  AST 116*  ALT 32  ALKPHOS 120  BILITOT 10.0*   PT/INR Recent Labs    05/27/2019 1811 05/31/2019 0442  LABPROT 21.7* 22.8*  INR 1.9* 2.1*     Studies/Results: Dg Chest Port 1 View  Result Date: 06/15/2019 CLINICAL DATA:  Endotracheal tube. EXAM: PORTABLE CHEST 1 VIEW COMPARISON:  June 05, 2019. FINDINGS: Endotracheal and nasogastric tubes are unchanged in position. Stable bilateral jugular catheters are noted. Right internal jugular catheter is noted with distal tip in expected position of main pulmonary artery. Bilateral chest tubes are noted without pneumothorax. Stable feeding tube is seen entering stomach. Slightly improved bilateral lung opacities are noted suggesting improving pulmonary edema. Bony thorax is unremarkable. IMPRESSION: Stable support apparatus. Stable bilateral chest tubes without pneumothorax. Improved bilateral lung opacities are noted. Electronically Signed   By: Marijo Conception M.D.   On: 05/28/2019 07:10   Dg Chest Port 1 View  Result Date: 06/17/2019 CLINICAL DATA:  Status post CABG. EXAM: PORTABLE CHEST 1 VIEW COMPARISON:  Earlier same day FINDINGS: 1804 hours. Endotracheal tube tip is 3.6 cm above the base of the carina. The NG tube tip is in the proximal stomach. Feeding catheter is transpyloric although the distal tip has not been included on the film. Right IJ pulmonary  artery catheter tip is in the main pulmonary artery. A second right IJ line is probably in the SVC but is partially obscured. A right PICC line tip overlies the mid SVC. Left IJ central line tip projects over the central mediastinum, over the expected course of the innominate vein. This line has been pulled back in the interval since the prior study. ECMO device seen on the previous study is no longer evident. Patient is status post aortic valve replacement. Left atrial appendage occluded device noted. 2 left chest tubes evident. Lucency at the midline is compatible with pneumomediastinum and may be in part  related to the median sternotomy. Bilateral diffuse airspace opacity is consistent with pulmonary edema. No substantial pleural effusion. IMPRESSION: 1. Support apparatus as above. 2. Diffuse bilateral airspace disease compatible with pulmonary edema. . Electronically Signed   By: Misty Stanley M.D.   On: 06/01/2019 18:37   Dg Chest Port 1 View  Result Date: 06/23/2019 CLINICAL DATA:  68 year old male status post open heart surgery. EXAM: PORTABLE CHEST 1 VIEW COMPARISON:  Chest radiograph dated 06/04/2019 FINDINGS: Endotracheal tube remains above the carina in similar position. Enteric tube extends below the diaphragm with side-port just distal to the GE junction and tip in the proximal stomach. Left IJ central venous line and right-sided PICC and ECMO apparatus remain in similar position. Diffuse bilateral airspace and interstitial densities as well as subpleural densities of the upper lobes appears similar to prior radiograph. No pneumothorax. Stable cardiac silhouette. No acute osseous pathology. IMPRESSION: 1. No significant interval change in the appearance of the lungs compared to prior radiograph. 2. Support lines and tubes are in similar position. Electronically Signed   By: Anner Crete M.D.   On: 05/27/2019 08:11    Impression/Plan: -Dark liquid in the rectal tube.  Could be diffuse  mucosal bleeding with significantly low platelet counts. -Cardiogenic and septic shock. -Cardiac arrest.  Currently on ECMO, s/p Impella placement.  Recommendations -------------------------- -Continue supportive care for now. -Start Protonix drip.  Currently receiving platelet transfusion. -Unfortunately, not able to EGD at this time because of significantly low platelet counts.  OG tube has small amount of green-colored liquid.  Recommend to flush OG tube if any concern for ongoing bleeding. -Recommend to check CBC every 6 hours. -Unfortunately, very difficult situation.    LOS: 21 days   Otis Brace  MD, FACP 06/14/2019, 12:20 PM  Contact #  [phone]

## 2019-06-06 NOTE — Brief Op Note (Signed)
06/10/2019  12:36 PM  PATIENT:  Carola Rhine  68 y.o. male  PRE-OPERATIVE DIAGNOSIS:  S/P CABG  POST-OPERATIVE DIAGNOSIS:  S/P CABG  PROCEDURE:  Procedure(s): EXPLORATION OF CHEST (N/A) TRANSESOPHAGEAL ECHOCARDIOGRAM (TEE) (N/A) Cannulation For Ecmo (Extracorporeal Membrane Oxygenation) (N/A) Indocyanine Green Fluorescence Imaging (Icg) (N/A)  SURGEON:  Surgeon(s) and Role:    * Wonda Olds, MD - Primary  PHYSICIAN ASSISTANT:   ASSISTANTS: staff   ANESTHESIA:   general  EBL:  Per anes  BLOOD ADMINISTERED:per anes/perfusion record  DRAINS: 4 chest tubes   LOCAL MEDICATIONS USED:  NONE  SPECIMEN:  No Specimen  DISPOSITION OF SPECIMEN:  N/A  COUNTS:  YES  TOURNIQUET:  * No tourniquets in log *  DICTATION: .Note written in EPIC  PLAN OF CARE: Admit to inpatient   PATIENT DISPOSITION:  ICU - intubated and critically ill.   Delay start of Pharmacological VTE agent (>24hrs) due to surgical blood loss or risk of bleeding: yes  Bertrice Leder Z. [name], [place]

## 2019-06-06 NOTE — Progress Notes (Signed)
Pharmacy Antibiotic Note  Raymond Chavez is a 68 y.o. male admitted on 04/27/2019 with cardiogenic shock requiring placement of ECMO and CRRT. Pharmacy has been consulted for vancomycin and meropenem dosing. Pt went for CABG + AVR on 11/11 and was de-cannulated, now back on ECMO with further decompensation.   Pt has received extra vancomycin dosing with frequent surgical trips over the past few days, so will defer further vancomycin dosing for now and check random level tomorrow prior to redosing.  Plan: Hold vancomycin for now, check random level in the morning Meropenem 1g IV q8h   Height: _0  (170.2 cm)(measured x3) Weight: 159 lb 9.8 oz (72.4 kg) IBW/kg (Calculated) : 66.1  Temp (24hrs), Avg:96.4 F (35.8 C), Min:95.2 F (35.1 C), Max:98.1 F (36.7 C)  Recent Labs  Lab 06/01/19 1016  05/28/2019 1653 06/04/19 0217  06/04/19 1640 06/04/19 2129 05/31/2019 0433 06/02/2019 0435  06/02/2019 1811  06/22/2019 0418 06/21/2019 0442  05/27/2019 0847 06/21/2019 0903 06/18/2019 0934 05/28/2019 1026 05/28/2019 1121  WBC  --    < > 9.2 9.3   < > 8.4 5.8 6.8  --   --  17.6*  --   --  17.9*  --   --   --   --   --   --   CREATININE  --    < > 1.61* 1.39*  --  1.37*  --  1.29*  --    < >  --    < >  --   --    < > 1.50* 1.50* 1.50* 1.50* 1.40*  LATICACIDVEN  --    < > 1.0 1.2  --  1.5  --   --  1.4  --   --   --  6.7*  --   --   --   --   --   --   --   VANCOTROUGH 14*  --   --   --   --   --   --   --   --   --   --   --   --   --   --   --   --   --   --   --    < > = values in this interval not displayed.    Estimated Creatinine Clearance: 46.6 mL/min (A) (by C-G formula based on SCr of 1.4 mg/dL (H)).    No Known Allergies  Antimicrobials this admission: Cefepime 10/22>>10/26, 10/29>>11/1 Vancomycin 10/22>>10/25, 10/28>>11/1, 11/4>> Ampicillin 11/1>>11/4 Meropenem 11/4>> Fluconazole 11/9 >>   Microbiology results: 11/9 BAL: 80k candida albicans 11/8 Resp Cx - few candida albicans 11/4  MRSA PCR + 10/29 BCx - negF 10/28 BAL - 40k e faecalis - S to vanc/amp 10/24 TA - normal flora 10/22BCx: ngF 10/22UCx:ng 10/22 Respiratory PCR: negative 10/22 COVID: negative  Thank you for allowing pharmacy to be a part of this patient's care.  Arrie Senate, PharmD, BCPS Clinical Pharmacist [phone] Please check AMION for all [place] numbers 06/17/2019

## 2019-06-06 NOTE — Anesthesia Postprocedure Evaluation (Signed)
Anesthesia Post Note  Patient: Raymond Chavez  Procedure(s) Performed: EXPLORATION OF CHEST (N/A Chest) TRANSESOPHAGEAL ECHOCARDIOGRAM (TEE) (N/A ) Cannulation For Ecmo (Extracorporeal Membrane Oxygenation) (N/A Chest) Indocyanine Green Fluorescence Imaging (Icg) (N/A Chest)     Patient location during evaluation: SICU Anesthesia Type: General Level of consciousness: patient remains intubated per anesthesia plan Pain management: pain level controlled Vital Signs Assessment: vitals unstable Respiratory status: patient remains intubated per anesthesia plan Cardiovascular status: unstable Anesthetic complications: no Comments: Pt unstable on ECMO                  Jene Huq DANIEL

## 2019-06-06 NOTE — Progress Notes (Signed)
Called to the bedside per RN. On arrival noted that patient is now becoming more hemodynamic unstable, specifically due to Cardiac Output/CI being significantly low. Arterial blood pressure are stable mostly indicative due to the vasopressors that patient is currently on. Patient is now on 100%. MD [name] at bedside.

## 2019-06-06 NOTE — Progress Notes (Addendum)
Pharmacy Anti-Coagulation Consult Note:  Pharmacy Consult for Heparin Indication for heparin: ECMO  No Known Allergies  Patient Measurements: Height: 5\' 7"  (170.2 cm)(measured x3) Weight: 159 lb 9.8 oz (72.4 kg) IBW/kg (Calculated) : 66.1  Vital Signs: Temp: 95.9 F (35.5 C) (11/12 0636)  Labs: Recent Labs    06/04/19 0757  06/04/19 1640  06/04/19 2129  06/18/2019 0433  06/04/2019 1811  06/15/2019 0442  05/27/2019 0855  06/17/2019 0934 06/09/2019 1026 05/28/2019 1116 05/27/2019 1121  HGB 8.3*   < > 8.5*   < > 7.1*   < > 7.6*   < > 13.6   < > 11.7*   < > 8.0*   < > 7.8* 8.8* 9.5* 9.5*  HCT 24.5*   < > 25.5*   < > 21.4*   < > 22.1*   < > 40.5   < > 33.9*   < > 23.9*   < > 23.0* 26.0* 28.0* 28.0*  PLT 82*  --  55*  --  43*  --  40*   < > 79*  --  22*  --  10*  --   --   --   --   --   APTT  --   --  48*  --  44*  --  36  --   --   --   --   --   --   --   --   --   --   --   LABPROT  --   --  17.0*  --   --   --  17.6*  --  21.7*  --  22.8*  --   --   --   --   --   --   --   INR  --   --  1.4*  --   --   --  1.5*  --  1.9*  --  2.1*  --   --   --   --   --   --   --   HEPARINUNFRC 0.17*  --   --   --  <0.10*  --   --   --   --   --   --   --   --   --   --   --   --   --   CREATININE  --   --  1.37*  --   --   --  1.29*   < >  --    < >  --    < >  --    < > 1.50* 1.50*  --  1.40*   < > = values in this interval not displayed.    Estimated Creatinine Clearance: 46.6 mL/min (A) (by C-G formula based on SCr of 1.4 mg/dL (H)).  Assessment: 68 yo M admitted with shock and concern for ACS now s/p Impella CP placement in cath lab. Impella CP swapped out for 5.0 d/t hemolysis. Underwent [facility] ECMO with goal of CABG for revascularization. ECMO initiated 11/4 pm and pt was started on heparin. This has intermittently been held with bleeding, pt most recently on 1100 units/h systemic only after Impella pulled 11/9 with aPTTs ~50s.  Pt underwent CABG + AVR on 11/11 then began decompensating requiring  re-cannulation of ECMO on 11/12. Pt has remained extremely thrombocytopenic, HIT panel previously negative, resent per MD.  Discussed with Dr. [name] - chest tube output has declined, will start IV heparin  500 units/hr and recheck aPTT in 6hr. Depending on aPTT and chest tube output, will likely need to go up slowly on heparin (no more than 100-200 units per adjustment).   Goal of Therapy:  Goal aPTT 50-70 seconds Monitor platelets by anticoagulation protocol: Yes   Plan:  Start IV heparin 500 units/hr Check 6hr aPTT until therapeutic x2 then begin q12h checks  ADDENDUM: concern for non-survivable RP bleed. Heparin off per PVT, will not restart, planning comfort measures.    Fredonia Highland, PharmD, BCPS Clinical Pharmacist [phone] Please check AMION for all [facility] numbers 06/03/2019

## 2019-06-06 NOTE — Progress Notes (Signed)
Day of Surgery Procedure(s) (LRB): EXPLORATION OF CHEST (N/A) TRANSESOPHAGEAL ECHOCARDIOGRAM (TEE) (N/A) Cannulation For Ecmo (Extracorporeal Membrane Oxygenation) (N/A) Indocyanine Green Fluorescence Imaging (Icg) (N/A) Subjective: Drop in BP, ECMO flow and 4 gram drop in Hemoglobin in less than an hour after returning to ICU from OR No evidence of bleeding from chest tubes- repeat CXR shows no developing hemothorax, no evidence of clot under neath the [place] sternal dressing. Suspect intra-abdominal bleeding , retroperitoneal or lower GI. Heparin on hold, CVVH stopped .  The patient has reached a point that is not survivable with severe internal bleeding that cannot be treated and with ongoing need for anticoagulation for the life-support circuit. Situation d/w family in ICU conference room and they agree that goal of care should be comfort directed. Will allow family to sit with patient, cont iv fentanyl, versed, and initiate DNR code status.    Objective: Vital signs in last 24 hours: Temp:  [95.2 F (35.1 C)-98.1 F (36.7 C)] 95.9 F (35.5 C) (11/12 0636) Pulse Rate:  [90-98] 90 (11/12 1201) Cardiac Rhythm: Atrial fibrillation (11/12 0000) Resp:  [18-42] 25 (11/12 1201) BP: (105-117)/(60-69) 117/69 (11/11 1954) SpO2:  [90 %-100 %] 100 % (11/12 1202) Arterial Line BP: (74-108)/(46-66) 85/53 (11/12 0700) FiO2 (%):  [30 %-100 %] 30 % (11/12 1202)  Hemodynamic parameters for last 24 hours: PAP: (41-63)/(28-41) 63/41 CVP:  [15 mmHg-22 mmHg] 21 mmHg CO:  [1.8 L/min-3.1 L/min] 1.9 L/min CI:  [1 L/min/m2-1.7 L/min/m2] 1.1 L/min/m2  Intake/Output from previous day: 11/11 0701 - 11/12 0700 In: 9698.8 [I.V.:5727.5; Blood:2504; IV Piggyback:1467.3] Out: 4489 [Urine:50; Blood:800; Chest Tube:510] Intake/Output this shift: Total I/O In: 3325 [I.V.:1500; Blood:1825] Out: -     Lab Results: Recent Labs    14-Jun-2019 0442  June 14, 2019 0855  06/14/19 1221 06/14/19 1229  WBC 17.9*   --   --   --  9.9  --   HGB 11.7*   < > 8.0*   < > 12.3* 11.2*  HCT 33.9*   < > 23.9*   < > 36.1* 33.0*  PLT 22*  --  10*  --  38*  --    < > = values in this interval not displayed.   BMET:  Recent Labs    06/18/2019 1938  06-14-19 0506  06/14/2019 1026  06-14-2019 1121 June 14, 2019 1229  NA 140   < > 141   < > 145   < > 143 144  K 5.6*   < > 5.5*   < > 5.5*   < > 5.3* 5.0  CL 107  --  106   < > 102  --  105  --   CO2 20*  --  16*  --   --   --   --   --   GLUCOSE 107*  --  121*   < > 130*  --  123*  --   BUN 52*  --  44*   < > 50*  --  47*  --   CREATININE 1.37*  --  1.44*   < > 1.50*  --  1.40*  --   CALCIUM 7.9*  --  8.2*  --   --   --   --   --    < > = values in this interval not displayed.    PT/INR:  Recent Labs    June 14, 2019 1221  LABPROT 29.1*  INR 2.8*   ABG    Component Value Date/Time   PHART  7.372 06/09/2019 1229   HCO3 26.7 05/30/2019 1229   TCO2 28 06/07/2019 1229   ACIDBASEDEF 2.0 06/18/2019 1116   O2SAT 100.0 06/10/2019 1229   CBG (last 3)  Recent Labs    06/03/2019 2348 06/03/2019 0425 06/21/2019 1212  GLUCAP 137* 117* 90    Assessment/Plan: S/P Procedure(s) (LRB): EXPLORATION OF CHEST (N/A) TRANSESOPHAGEAL ECHOCARDIOGRAM (TEE) (N/A) Cannulation For Ecmo (Extracorporeal Membrane Oxygenation) (N/A) Indocyanine Green Fluorescence Imaging (Icg) (N/A)  S/P re- cannulation for VA ECMO earlier today with acute decompensation c/w severe internal bleeding   LOS: 21 days    Raymond Chavez 06/13/2019

## 2019-06-06 NOTE — Progress Notes (Addendum)
Patient ID: Raymond Chavez, male   DOB: September 29, 1950, 68 y.o.   MRN: [id number] S: Pt with 4 gm drop in Hgb and likely actively bleeding internally.  Discussed case with Dr. Prescott Gum who is transitioning pt to comfort care at this time and will speak with the son. O:BP 117/69   Pulse 90   Temp (!) 95.9 F (35.5 C)   Resp (!) 25   Ht '5\' 7"'  (1.702 m) Comment: measured x3  Wt 72.4 kg   SpO2 100%   BMI 25.00 kg/m   Intake/Output Summary (Last 24 hours) at 06/15/2019 1356 Last data filed at 06/09/2019 1116 Gross per 24 hour  Intake 11820.81 ml  Output 4295 ml  Net 7525.81 ml   Intake/Output: I/O last 3 completed shifts: In: 11777.9 [I.V.:6391.5; Blood:2819; NG/GT:250; IV Piggyback:2317.3] Out: 6517 [Urine:50; Drains:30; BHALP:3790; Stool:300; Blood:800; Chest Tube:570]  Intake/Output this shift:  Total I/O In: 3325 [I.V.:1500; Blood:1825] Out: -  Weight change:  Gen: intubated, critically ill appearing male CVS:RRR Resp: bilateral rhonchi Abd: hypoactive Ext: cyanosis of extremities  Recent Labs  Lab 05/30/19 1626  05/31/19 0308  05/31/19 1618  06/02/19 0303  06/04/2019 0407  05/27/2019 1013  06/11/2019 1653  06/04/19 0217  06/04/19 1313  06/04/19 1640  06/13/2019 0433  06/20/2019 1938  05/26/2019 0506  06/13/2019 0756 06/16/2019 0847 06/24/2019 0903 06/02/2019 0934 06/12/2019 1026 06/17/2019 1116 06/02/2019 1121  NA 138   < > 137   < > 136   < > 138   < > 137   < >  --    < > 139   < > 136   < >  --    < > 137   < > 138   < > 140   < > 141   < > 146* 146* 145 145 145 144 143  K 4.7   < > 4.7   < > 4.6   < > 4.7   < > 4.7   < >  --    < > 5.2*   < > 5.0   < >  --    < > 5.0   < > 4.9   < > 5.6*   < > 5.5*   < > 4.6 5.1 5.3* 5.4* 5.5* 5.2* 5.3*  CL 106  --  107  --  103   < > 105   < > 104  --   --   --  107  --  104  --   --   --  107  --  105   < > 107  --  106  --  101 102 104 103 102  --  105  CO2 25  --  24  --  23   < > 25   < > 23  --   --   --  23  --  23  --   --   --  22  --  23  --   20*  --  16*  --   --   --   --   --   --   --   --   GLUCOSE 152*  --  184*  --  172*   < > 208*   < > 178*  --   --   --  120*  --  150*  --   --   --  167*  --  104*   < >  107*  --  121*  --  60* 114* 118* 106* 130*  --  123*  BUN 37*  --  33*  --  31*   < > 31*   < > 38*  --   --   --  48*  --  45*  --   --   --  46*  --  45*   < > 52*  --  44*  --  49* 46* 47* 49* 50*  --  47*  CREATININE 1.69*  --  1.45*  --  1.43*   < > 1.29*   < > 1.31*  --   --   --  1.61*  --  1.39*  --   --   --  1.37*  --  1.29*   < > 1.37*  --  1.44*  --  1.40* 1.50* 1.50* 1.50* 1.50*  --  1.40*  ALBUMIN 2.2*  --  2.3*  --  2.7*   < > 2.3*   < > 2.1*  --   --   --  1.7*  --  2.2*  --   --   --  2.4*  --  2.5*  --  2.7*  --  3.0*  --   --   --   --   --   --   --   --   CALCIUM 6.9*  --  6.8*  --  7.2*   < > 7.3*   < > 7.6*  --   --   --  6.9*  --  7.1*  --   --   --  7.1*  --  7.3*  --  7.9*  --  8.2*  --   --   --   --   --   --   --   --   PHOS 4.5  --  3.6  --  3.4  --  3.1  --   --   --  2.9  --   --   --   --   --  4.3  --   --   --  2.7  --   --   --   --   --   --   --   --   --   --   --   --   AST 22  --  22  --  32   < > 39   < > 47*  --   --   --  43*  --  35  --   --   --  35  --  29  --  95*  --  116*  --   --   --   --   --   --   --   --   ALT 28  --  22  --  22   < > 21   < > 23  --   --   --  18  --  20  --   --   --  19  --  19  --  24  --  32  --   --   --   --   --   --   --   --    < > = values in this interval not displayed.   Liver Function Tests: Recent Labs  Lab 06/04/2019 0433 05/28/2019 1938 05/28/2019 0506  AST 29 95* 116*  ALT 19 24 32  ALKPHOS 71 82 120  BILITOT 2.4* 12.3* 10.0*  PROT 4.5* 4.6* 5.2*  ALBUMIN 2.5* 2.7* 3.0*   No results for input(s): LIPASE, AMYLASE in the last 168 hours. No results for input(s): AMMONIA in the last 168 hours. CBC: Recent Labs  Lab 06/02/19 1150  06/04/19 2129  06/09/2019 0433  06/13/2019 1811  06/09/2019 0442  06/20/2019 0855  06/04/2019 1116  06/17/2019 1121 06/22/2019 1221  WBC 14.9*   < > 5.8  --  6.8  --  17.6*  --  17.9*  --   --   --   --   --  9.9  NEUTROABS 13.6*  --   --   --   --   --  15.1*  --   --   --   --   --   --   --   --   HGB 8.1*   < > 7.1*   < > 7.6*   < > 13.6   < > 11.7*   < > 8.0*   < > 9.5* 9.5* 12.3*  HCT 24.1*   < > 21.4*   < > 22.1*   < > 40.5   < > 33.9*   < > 23.9*   < > 28.0* 28.0* 36.1*  MCV 93.1   < > 87.7  --  86.7  --  88.8  --  87.1  --   --   --   --   --  89.1  PLT 32*   < > 43*  --  40*   < > 79*  --  22*  --  10*  --   --   --  38*   < > = values in this interval not displayed.   Cardiac Enzymes: No results for input(s): CKTOTAL, CKMB, CKMBINDEX, TROPONINI in the last 168 hours. CBG: Recent Labs  Lab 06/09/2019 1850 06/22/2019 2000 05/28/2019 2348 06/01/2019 0425 06/01/2019 1212  GLUCAP 100* 109* 137* 117* 90    Iron Studies: No results for input(s): IRON, TIBC, TRANSFERRIN, FERRITIN in the last 72 hours. Studies/Results: Dg Chest Port 1 View  Result Date: 06/20/2019 CLINICAL DATA:  Endotracheal tube. EXAM: PORTABLE CHEST 1 VIEW COMPARISON:  June 05, 2019. FINDINGS: Endotracheal and nasogastric tubes are unchanged in position. Stable bilateral jugular catheters are noted. Right internal jugular catheter is noted with distal tip in expected position of main pulmonary artery. Bilateral chest tubes are noted without pneumothorax. Stable feeding tube is seen entering stomach. Slightly improved bilateral lung opacities are noted suggesting improving pulmonary edema. Bony thorax is unremarkable. IMPRESSION: Stable support apparatus. Stable bilateral chest tubes without pneumothorax. Improved bilateral lung opacities are noted. Electronically Signed   By: Marijo Conception M.D.   On: 06/10/2019 07:10   Dg Chest Port 1 View  Result Date: 06/13/2019 CLINICAL DATA:  Status post CABG. EXAM: PORTABLE CHEST 1 VIEW COMPARISON:  Earlier same day FINDINGS: 1804 hours. Endotracheal tube tip is 3.6 cm above the  base of the carina. The NG tube tip is in the proximal stomach. Feeding catheter is transpyloric although the distal tip has not been included on the film. Right IJ pulmonary artery catheter tip is in the main pulmonary artery. A second right IJ line is probably in the SVC but is partially obscured. A right PICC line tip overlies the mid SVC. Left IJ central line tip projects over the central mediastinum, over the expected course of the innominate  vein. This line has been pulled back in the interval since the prior study. ECMO device seen on the previous study is no longer evident. Patient is status post aortic valve replacement. Left atrial appendage occluded device noted. 2 left chest tubes evident. Lucency at the midline is compatible with pneumomediastinum and may be in part related to the median sternotomy. Bilateral diffuse airspace opacity is consistent with pulmonary edema. No substantial pleural effusion. IMPRESSION: 1. Support apparatus as above. 2. Diffuse bilateral airspace disease compatible with pulmonary edema. . Electronically Signed   By: Misty Stanley M.D.   On: 06/14/2019 18:37   Dg Chest Port 1 View  Result Date: 05/30/2019 CLINICAL DATA:  68 year old male status post open heart surgery. EXAM: PORTABLE CHEST 1 VIEW COMPARISON:  Chest radiograph dated 06/04/2019 FINDINGS: Endotracheal tube remains above the carina in similar position. Enteric tube extends below the diaphragm with side-port just distal to the GE junction and tip in the proximal stomach. Left IJ central venous line and right-sided PICC and ECMO apparatus remain in similar position. Diffuse bilateral airspace and interstitial densities as well as subpleural densities of the upper lobes appears similar to prior radiograph. No pneumothorax. Stable cardiac silhouette. No acute osseous pathology. IMPRESSION: 1. No significant interval change in the appearance of the lungs compared to prior radiograph. 2. Support lines and tubes are  in similar position. Electronically Signed   By: Anner Crete M.D.   On: 06/12/2019 08:11   . sodium chloride   Intravenous Once  . acetaminophen  1,000 mg Oral Q6H   Or  . acetaminophen (TYLENOL) oral liquid 160 mg/5 mL  1,000 mg Per Tube Q6H  . acetaminophen (TYLENOL) oral liquid 160 mg/5 mL  650 mg Per Tube Once   Or  . acetaminophen  650 mg Rectal Once  . arformoterol  15 mcg Nebulization BID  . [START ON 06/07/2019] atorvastatin  40 mg Per Tube q1800  . B-complex with vitamin C  1 tablet Per Tube Daily  . bisacodyl  10 mg Oral Daily   Or  . bisacodyl  10 mg Rectal Daily  . calcium chloride  1 g Intravenous Once  . chlorhexidine gluconate (MEDLINE KIT)  15 mL Mouth Rinse BID  . Chlorhexidine Gluconate Cloth  6 each Topical Daily  . [START ON 06/07/2019] docusate  200 mg Per Tube Daily  . feeding supplement (PRO-STAT SUGAR FREE 64)  30 mL Per Tube QID  . guaiFENesin  30 mL Per Tube BID  . mouth rinse  15 mL Mouth Rinse 10 times per day  . mupirocin ointment   Nasal BID  . polyvinyl alcohol  1 drop Both Eyes QID  . sodium bicarbonate      . sodium chloride flush  3 mL Intravenous Q12H  . [START ON 06/07/2019] umeclidinium bromide  2 puff Inhalation Daily  . vancomycin variable dose per unstable renal function (pharmacist dosing)   Does not apply See admin instructions    BMET    Component Value Date/Time   NA 143 06/19/2019 1121   K 5.3 (H) 06/15/2019 1121   CL 105 06/02/2019 1121   CO2 16 (L) 06/04/2019 0506   GLUCOSE 123 (H) 06/09/2019 1121   BUN 47 (H) 06/17/2019 1121   CREATININE 1.40 (H) 06/19/2019 1121   CALCIUM 8.2 (L) 06/16/2019 0506   GFRNONAA 49 (L) 06/11/2019 0506   GFRAA 57 (L) 05/27/2019 0506   CBC    Component Value Date/Time   WBC 9.9 05/28/2019  1221   RBC 4.05 (L) 06/17/2019 1221   HGB 12.3 (L) 06/07/2019 1221   HCT 36.1 (L) 05/27/2019 1221   PLT 38 (L) 06/24/2019 1221   MCV 89.1 06/14/2019 1221   MCH 30.4 05/28/2019 1221   MCHC 34.1  05/30/2019 1221   RDW 14.6 05/31/2019 1221   LYMPHSABS 0.5 (L) 06/16/2019 1811   MONOABS 1.2 (H) 06/24/2019 1811   EOSABS 0.7 (H) 06/01/2019 1811   BASOSABS 0.0 06/15/2019 1811     Assessment/Plan:  1. AKI/CKD stage 3- has been on CVVHD since 05/24/19 due to multiple factors: cardiorenal syndrome, contrast nephropathy, ischemic ATN following cardiac arrest.  No hemodynamically unstable with drop in Hgb.  Will stop CVVHD as he is actively breathing internally.   2. Cardiogenic shock- EF 20% s/p cardiac arrest on ECMO.   3. CAD s/p CABG x 3 4. ABLA- as above 5. Disposition- poor overall prognosis and Dr. Prescott Gum to speak with family regarding withdrawal of care. I was contacted by RN that Dr. [name] would like to continue with full supportive measures since the patient has stabilized from an hemodynamic standpoint.  Will resume CVVHD without heparin for now until family decides to transition to comfort care.  Donetta Potts, MD [employer] [phone]

## 2019-06-06 NOTE — CV Procedure (Signed)
ECMO INITIATION   Patient: Raymond Chavez, 05/03/1951, 68 y.o. Location:   Date of Service:  06/05/2019     Time: 12:26 PM  Date of Admission: 04/29/2019 Admitting diagnosis: Coronary artery disease  Ht: 5\' 7"  (170.2 cm)(measured x3) Wt: 72.4 kg BSA: Body surface area is 1.85 meters squared.  Blood Type: O POS Allergies: No Known Allergies  Past medical history:  Past Medical History:  Diagnosis Date  . Acute respiratory failure with hypoxemia (HCC) 04/2019  . Carotid artery occlusion   . Hypertension    Past surgical history:  Past Surgical History:  Procedure Laterality Date  . CANNULATION FOR ECMO (EXTRACORPOREAL MEMBRANE OXYGENATION) N/A 06/12/2019   Procedure: CANNULATION FOR ECMO (EXTRACORPOREAL MEMBRANE OXYGENATION);  Surgeon: 13/10/2018, MD;  Location: [facility] OR;  Service: Open Heart Surgery;  Laterality: N/A;  PARTIAL STERNOTOMY  . CANNULATION FOR ECMO (EXTRACORPOREAL MEMBRANE OXYGENATION)  2019-06-07   Procedure: INSERTION OF LEFT VENTRICLE VENT FOR ECMO;  Surgeon: 13/03/2019, MD;  Location: [facility] OR;  Service: Open Heart Surgery;;  . CHEST TUBE INSERTION  06/07/2019   Procedure: Chest Tube Insertion;  Surgeon: 13/03/2019, MD;  Location: [facility] OR;  Service: Open Heart Surgery;;  . GROIN DISSECTION  05/25/2019   Procedure: right femoral artery repair and left heart catheterization;  Surgeon: 05/20/2019, MD;  Location: [facility] OR;  Service: Open Heart Surgery;;  . INSERTION OF IMPLANTABLE LEFT VENTRICULAR ASSIST DEVICE N/A 05/21/2019   Procedure: INSERTION OF IMPELLA 5.0 IMPLANTABLE LEFT VENTRICULAR ASSIST DEVICE THROUGH RIGHT AXILLARY; REMOVAL OF RIGHT GROIN IMPELLA CP;  Surgeon: 05/20/2019, MD;  Location: [facility] OR;  Service: Open Heart Surgery;  Laterality: N/A;  . REMOVAL OF IMPELLA LEFT VENTRICULAR ASSIST DEVICE N/A June 07, 2019   Procedure: REMOVAL OF IMPELLA LEFT VENTRICULAR ASSIST DEVICE;  Surgeon: 13/03/2019, MD;  Location: [facility] OR;  Service: Open  Heart Surgery;  Laterality: N/A;  . RIGHT/LEFT HEART CATH AND CORONARY ANGIOGRAPHY N/A 05/18/2019   Procedure: RIGHT/LEFT HEART CATH AND CORONARY ANGIOGRAPHY;  Surgeon: 05/19/2019, MD;  Location: [facility] INVASIVE CV LAB;  Service: Cardiovascular;  Laterality: N/A;  . TEE WITHOUT CARDIOVERSION N/A 06/20/2019   Procedure: TRANSESOPHAGEAL ECHOCARDIOGRAM (TEE);  Surgeon: 13/10/2018, MD;  Location: [facility] OR;  Service: Open Heart Surgery;  Laterality: N/A;  . TEE WITHOUT CARDIOVERSION N/A 06/07/19   Procedure: TRANSESOPHAGEAL ECHOCARDIOGRAM (TEE);  Surgeon: 13/03/2019, MD;  Location: [facility] OR;  Service: Open Heart Surgery;  Laterality: N/A;  . VENTRICULAR ASSIST DEVICE INSERTION N/A 05/13/2019   Procedure: VENTRICULAR ASSIST DEVICE INSERTION;  Surgeon: 05/19/2019, MD;  Location: [facility] INVASIVE CV LAB;  Service: Cardiovascular;  Laterality: N/A;    Indication for ECMO: Post-Cardiotomy  ECMO was deployed at 0700 and initiated at 1031  Anticoagulation achieved with Heparin on CPB. Cannulated for ECMO Mode: VA and achieved initial ECMO Flow (LPM): 4.14 and ECMO Sweep Gas (LPM): 3.    ECMO Cannula Information     Staff Present  Primary Perfusionist [facility]  Assisting Perfusionist/ECMO Specialist Patton Salles  Cannulating Physician Dr. Aline August   ECMO Lot Numbers  CardioHelp Console  Cardiohelp 2  Oxygenator  Vickey Sages  Tubing Pack [id number]  ECMO Goals  Cardiovascular Goals   MAP 70  Respiratory/ABG Goals   Normalized ABG  Other Goals   Maintain flow > 3.5  Anticoagulation Goals Heparin off for now   ECMO Handoff  Patient Information * Age Height Weight BSA IBW BMI  68  y.o. 5\' 7"  (170.2 cm)(measured x3)  (72.4 kg Body surface area is 1.85 meters squared. 67.3 kg  Body mass index is 25 kg/m.   Review History * Primary Diagnosis   Coronary artery disease  Prior Cardiac Arrest within 24hrs of ECMO initiation? Yes  ECMO and MCS * Type ECMO Flow ECMO Sweep Gases    ECMO Device: Cardiohelp   Flow (LPM): 4.14   Sweep Gas (LPM): 3     Additional Mechanical Support   Ventilation *    $ Ventilator Initial/Subsequent : Subsequent, Vent Mode: PCV, Vt Set: 400 mL, Set Rate: 25 bmp, FiO2 (%): 30 %, I Time: 0.9 Sec(s), PEEP: 10 cmH20     Access Sites * Arterial    22 Fr. Aorta, LV 18 Fr.  Central  22 Fr. Right Atrium  Peripheral     *Cannula(e) sutured and anchored, secured and dressed.   Infusions and Interventions * Drugs/Dose    Interventions/Blood Products     Labs and Imaging *  *Cannulation position verified via imaging on arrival to ICU. Concerns communicated to attending surgeon. Labs reviewed.   All ECMO safety checks complete. ECMO flowsheet initiated, applicable charges captured, LDA's entered/confirmed, imaging and labs verified, blood products available, and report given to [place].

## 2019-06-06 NOTE — Transfer of Care (Signed)
Immediate Anesthesia Transfer of Care Note  Patient: Raymond Chavez  Procedure(s) Performed: EXPLORATION OF CHEST (N/A Chest) TRANSESOPHAGEAL ECHOCARDIOGRAM (TEE) (N/A ) Cannulation For Ecmo (Extracorporeal Membrane Oxygenation) (N/A Chest) Indocyanine Green Fluorescence Imaging (Icg) (N/A Chest)  Patient Location: ICU  Anesthesia Type:General  Level of Consciousness: Patient remains intubated per anesthesia plan  Airway & Oxygen Therapy: Patient remains intubated per anesthesia plan and Patient placed on Ventilator (see vital sign flow sheet for setting)  Post-op Assessment: Report given to RN and Post -op Vital signs reviewed and stable  Post vital signs: Reviewed and stable  Last Vitals:  Vitals Value Taken Time  BP    Temp    Pulse 34 06/24/2019 1154  Resp 26 05/28/2019 1206  SpO2 99 % 05/26/2019 1154  Vitals shown include unvalidated device data.  Last Pain:  Vitals:   03-Jul-2019 2000  TempSrc:   PainSc: 0-No pain         Complications: No apparent anesthesia complications

## 2019-06-06 NOTE — Progress Notes (Signed)
  Echocardiogram Echocardiogram Transesophageal has been performed.  Raymond Chavez 05/26/2019, 8:34 AM

## 2019-06-06 NOTE — Progress Notes (Signed)
Patient was manually ventilated to the OR per CRNA Nira Conn, this RRT assisted in pushing the INO therapy machine to the OR. Patient is now currently in the care of the OR staff. Patient will require high complexity monitoring and intervention. MD Atkins at bedside in OR.  No complications noted throughout the transport. Uneventful trip to the OR.

## 2019-06-06 NOTE — Progress Notes (Addendum)
Talked to Dr. [name] regarding the POC. Discussed issues of flows and chugging on ECLS- currently on second bottle of albumin. Flowing 4.1 on 3200 rpm. CVP 5-7. H&H on cardiohelp is stable at 12.0 and 37.0%. Orders to transfuse 2 FFP and more albumin if needed to resolve volume issues. Call back if there appears to be issues with bleeding or drop in H&H occurs.   Also discussed last ABG with sweep on 3.5. Vent not on rest settings. MD said okay to leave vent on current settings and increase sweep to 4. Reach out to CCM if vent needs changes. Orders also received to titrate vaso down if patient remains hypertensive.

## 2019-06-06 NOTE — Anesthesia Preprocedure Evaluation (Addendum)
Anesthesia Evaluation  Patient identified by MRN, date of birth, ID band Patient unresponsive    Reviewed: Unable to perform ROS - Chart review onlyPreop documentation limited or incomplete due to emergent nature of procedure.  Airway Mallampati: Intubated       Dental   Pulmonary Current Smoker,  VDRF   + rhonchi        Cardiovascular hypertension, + CAD, + Past MI and +CHF   Rate:Bradycardia     Neuro/Psych    GI/Hepatic   Endo/Other    Renal/GU ARFRenal disease     Musculoskeletal   Abdominal   Peds  Hematology   Anesthesia Other Findings   Reproductive/Obstetrics                            Anesthesia Physical  Anesthesia Plan  ASA: IV  Anesthesia Plan: General   Post-op Pain Management:    Induction: Intravenous  PONV Risk Score and Plan: 1 and Treatment may vary due to age or medical condition  Airway Management Planned: Oral ETT  Additional Equipment: Arterial line, CVP, PA Cath and TEE  Intra-op Plan:   Post-operative Plan: Post-operative intubation/ventilation  Informed Consent: I have reviewed the patients History and Physical, chart, labs and discussed the procedure including the risks, benefits and alternatives for the proposed anesthesia with the patient or authorized representative who has indicated his/her understanding and acceptance.     History available from chart only  Plan Discussed with: CRNA and Surgeon  Anesthesia Plan Comments:                                         Anesthesia Evaluation  Patient identified by MRN, date of birth, ID band Patient awake    Reviewed: Allergy & Precautions, NPO status , Patient's Chart, lab work & pertinent test results  Airway       Comment: Intubated  Dental   Pulmonary Current Smoker,           Cardiovascular hypertension, + Past MI and +CHF   Rhythm:Regular Rate:Normal + Systolic  murmurs History noted CG   Neuro/Psych    GI/Hepatic   Endo/Other    Renal/GU Renal disease     Musculoskeletal   Abdominal   Peds  Hematology   Anesthesia Other Findings   Reproductive/Obstetrics                             Anesthesia Physical Anesthesia Plan  ASA: IV  Anesthesia Plan: General   Post-op Pain Management:    Induction: Intravenous  PONV Risk Score and Plan: 2 and Propofol infusion and Midazolam  Airway Management Planned: Oral ETT  Additional Equipment: Arterial line, PA Cath and TEE  Intra-op Plan:   Post-operative Plan: Post-operative intubation/ventilation  Informed Consent: I have reviewed the patients History and Physical, chart, labs and discussed the procedure including the risks, benefits and alternatives for the proposed anesthesia with the patient or authorized representative who has indicated his/her understanding and acceptance.       Plan Discussed with: CRNA, Anesthesiologist and Surgeon  Anesthesia Plan Comments:         Anesthesia Quick Evaluation  Anesthesia Quick Evaluation

## 2019-06-06 NOTE — Progress Notes (Signed)
[facility] Progress Note Patient Name: Raymond Chavez DOB: 1950-09-22 MRN: [id number]   Date of Service  06/16/2019  HPI/Events of Note  Marked respiratory alkalosis, PH 7.59  eICU Interventions  Ventilator respiratory rate setting reduced to 20 from 25, ABG in 60 minutes.        [name] 05/30/2019, 10:55 PM

## 2019-06-06 NOTE — OR Nursing (Signed)
NOTE: Incorrect count due to keepers placed in chest by physician to keep ECMO cannulas in place.

## 2019-06-06 NOTE — Progress Notes (Addendum)
Patient ID: Raymond Chavez, male   DOB: 03/14/1951, 68 y.o.   MRN: [id number]    Advanced Heart Failure Rounding Note   Subjective:    Events - Cath 10/23 with sever 3v CAD and cardiogenic shock. Impella CP placed - Underwent placement of Impella 5.0 on 10/24 with removal of R femoral Impella CP.  - Extubated 11/1, CVVH held on 11/3 given  - He had a possible aspiration event afternoon 11/3 then became progressively hypoxemic during the night.  He had to be re-intubated.  Bronchoscopy showed copious blood return suggesting hemoptysis. He developed PEA arrest during this time and underwent CPR  -11/4 initiate ECMO.  Patient went to OR, arterial cannula in ascending aorta and venous cannula in right femoral vein. Impella remains in place at P2.  - 11/7 ECMO circuit change for high dP - 11/9 Impella removed, LV vent placed.  Moderate AI noted on TEE after Impella pulled.  Yeast in BAL, started on fluconazole.  - 11/11 to OR for CABG x 4 + bioprosthetic AoV + LA clip and VA ECMO decannulation. Chest left open.   Remains intubated/sedated on CVVH.    Currently on NE 25, epi 5, dopamine 2.5, milrinone 0.375, vasopressin 0.04 with MAP in 70s but low cardiac output.   Swan numbers: CVP 22 PA 62/36 CI 1 Co-ox 47%  He is on vancomycin/meropenem/fluconazole. Enterococcus faecalis, Candida albicans on BAL cultures.    Developed AF. Was on amiodarone, now off.  He is a-paced.     CVVHD basically running even with high rate of input.   Plts back down to 22 this morning.   Limited echo: I do not see pericardial effusion but LV EF appears to be 10-15%.    Objective:   Weight Range:  Vital Signs:   Temp:  [95.2 F (35.1 C)-97.2 F (36.2 C)] 96.4 F (35.8 C) (11/12 0530) Pulse Rate:  [90-98] 98 (11/11 1954) Resp:  [18-36] 36 (11/12 0530) BP: (105-117)/(60-69) 117/69 (11/11 1954) SpO2:  [90 %-98 %] 95 % (11/12 0530) Arterial Line BP: (74-108)/(46-66) 97/58 (11/12 0530) FiO2 (%):  [30  %-100 %] 80 % (11/12 0343) Last BM Date: 06/04/19  Weight change: Filed Weights   05/28/2019 0430 06/04/19 0432 05/30/2019 0400  Weight: 72.1 kg 72.3 kg 72.4 kg    Intake/Output:   Intake/Output Summary (Last 24 hours) at 05/28/2019 0630 Last data filed at 06/17/2019 0500 Gross per 24 hour  Intake 9303.24 ml  Output 4456 ml  Net 4847.24 ml     Physical Exam:  General: Intubated/sedated Neck: JVP elevated, no thyromegaly or thyroid nodule.  Lungs: Decreased BS at bases.  CV:  Heart regular S1/S2, no S3/S4, no murmur.  1+ edema to knees.   Abdomen: Soft, no hepatosplenomegaly, no distention.  Skin: Intact without lesions or rashes.  Neurologic: Sedated  Extremities: No clubbing or cyanosis.  HEENT: Normal.    Telemetry: a-paced 80s (personally reviewed)  Labs: Basic Metabolic Panel: Recent Labs  Lab 05/31/19 0308  05/31/19 1618  06/01/19 0316  06/01/19 1555  06/02/19 0303  06/04/2019 1013  06/04/19 0217  06/04/19 1313  06/04/19 1640  06/24/2019 0433  06/12/2019 1836 06/15/2019 1938 06/13/2019 2147 06/01/2019 0418 05/30/2019 0434 06/17/2019 0506  NA 137   < > 136   < > 137   < > 140   < > 138   < >  --    < > 136   < >  --    < > 137   < >  138   < > 142 140 140  --  139 141  K 4.7   < > 4.6   < > 4.6   < > 4.8   < > 4.7   < >  --    < > 5.0   < >  --    < > 5.0   < > 4.9   < > 5.7* 5.6* 5.3*  --  5.4* 5.5*  CL 107  --  103   < > 105  --  107  --  105   < >  --    < > 104  --   --   --  107  --  105  --   --  107  --   --   --  106  CO2 24  --  23   < > 24  --  24  --  25   < >  --    < > 23  --   --   --  22  --  23  --   --  20*  --   --   --  16*  GLUCOSE 184*  --  172*   < > 210*  --  176*  --  208*   < >  --    < > 150*  --   --   --  167*  --  104*  --   --  107*  --   --   --  121*  BUN 33*  --  31*   < > 32*  --  30*  --  31*   < >  --    < > 45*  --   --   --  46*  --  45*  --   --  52*  --   --   --  44*  CREATININE 1.45*  --  1.43*   < > 1.35*  --  1.26*  --  1.29*   <  >  --    < > 1.39*  --   --   --  1.37*  --  1.29*  --   --  1.37*  --   --   --  1.44*  CALCIUM 6.8*  --  7.2*   < > 7.2*  --  7.1*  --  7.3*   < >  --    < > 7.1*  --   --   --  7.1*  --  7.3*  --   --  7.9*  --   --   --  8.2*  MG 2.3  --  2.4  --  2.6*  --  2.4  --   --   --   --   --   --   --   --   --   --   --   --   --   --   --   --  3.3*  --   --   PHOS 3.6  --  3.4  --   --   --   --   --  3.1  --  2.9  --   --   --  4.3  --   --   --  2.7  --   --   --   --   --   --   --    < > = values  in this interval not displayed.    Liver Function Tests: Recent Labs  Lab 06/04/19 0217 06/04/19 1640 05/26/2019 0433 05/30/2019 1938 06/18/2019 0506  AST 35 35 29 95* 116*  ALT _0 32  ALKPHOS 65 75 71 82 120  BILITOT 1.8* 1.8* 2.4* 12.3* 10.0*  PROT 4.3* 4.4* 4.5* 4.6* 5.2*  ALBUMIN 2.2* 2.4* 2.5* 2.7* 3.0*   No results for input(s): LIPASE, AMYLASE in the last 168 hours. No results for input(s): AMMONIA in the last 168 hours.  CBC: Recent Labs  Lab 06/02/19 1150  06/04/19 1640  06/04/19 2129  06/04/2019 0433  05/30/2019 1351  05/31/2019 1811 05/27/2019 1836 06/09/2019 2147 06/02/2019 0434 06/07/2019 0442  WBC 14.9*   < > 8.4  --  5.8  --  6.8  --   --   --  17.6*  --   --   --  17.9*  NEUTROABS 13.6*  --   --   --   --   --   --   --   --   --  15.1*  --   --   --   --   HGB 8.1*   < > 8.5*   < > 7.1*   < > 7.6*   < > 8.1*   < > 13.6 12.2* 11.2* 11.6* 11.7*  HCT 24.1*   < > 25.5*   < > 21.4*   < > 22.1*   < > 22.9*   < > 40.5 36.0* 33.0* 34.0* 33.9*  MCV 93.1   < > 87.0  --  87.7  --  86.7  --   --   --  88.8  --   --   --  87.1  PLT 32*   < > 55*  --  43*  --  40*  --  18*  --  79*  --   --   --  22*   < > = values in this interval not displayed.    Cardiac Enzymes: No results for input(s): CKTOTAL, CKMB, CKMBINDEX, TROPONINI in the last 168 hours.  BNP: BNP (last 3 results) Recent Labs    05/04/2019 1739 04/30/2019 1234  BNP 2,725.3* 3,370.0*    ProBNP (last 3 results) No  results for input(s): PROBNP in the last 8760 hours.    Other results:  Imaging: Ct Abdomen Pelvis Wo Contrast  Result Date: 06/04/2019 CLINICAL DATA:  Aortic disease, exchange of Impella for ventricular vent. EXAM: CT CHEST, ABDOMEN, AND PELVIS without CONTRAST TECHNIQUE: Multidetector CT imaging of the chest, abdomen and pelvis was performed following the standard protocol without intravenous contrast CONTRAST:  None COMPARISON:  None. FINDINGS: CT CHEST FINDINGS Cardiovascular: Interval sternotomy and removal of Impella device with placement of ventricular ECMO component, tip in the left ventricular apex. Small amount of hematoma in the anterior mediastinum. Also with aortic cannula in place passing through upper margin of sternotomy. Surgical drains are in place in this location. Subxiphoid mediastinal drain as well. Endotracheal tube terminates approximately 1.4 cm above the carina. Material within the bronchi peripheral to the tube. Right IJ central venous catheter terminating distal superior vena cava along with a right sided venous access device, likely PICC. Left-sided venous catheter terminates in the brachiocephalic just short of the veno veno ECMO cannula. Mediastinum/Nodes: Signs of stranding in the anterior mediastinum and small anterior mediastinal hematoma after sternotomy. Is Lungs/Pleura: Dense basilar consolidation bilaterally with patchy areas of airspace opacity. Musculoskeletal: Signs of sternotomy  as described. Also with signs of right axillary dissection and surgical drain in this location. CT ABDOMEN PELVIS FINDINGS Hepatobiliary: Liver is normal with sludge in the gallbladder. Pancreas: Normal appearance of pancreas. Spleen: Normal in size without focal abnormality. Adrenals/Urinary Tract: Normal appearance of the adrenal glands. Moderate size left renal cysts with renal sinus component measure approximately 2.9 x 4.3 cm. No signs of hydronephrosis. Stomach/Bowel: No signs of  bowel obstruction or acute bowel process. Fluid-filled bowel with bowel management tube in place in the rectum. Vascular/Lymphatic: Right groin venous ECMO cannula in place terminating in the superior vena cava. Vascular findings in the chest as described. Dense atherosclerotic calcification of the abdominal aorta extending into branch vessels. Stranding about the left groin without organized hematoma may reflect previous vascular access. Reproductive: Prostate unremarkable by CT. Other: Ovoid iliacus hematoma moderately large 8.1 x 4.9 x 8.9 cm. No signs of free intraperitoneal air. No signs of ascites. Musculoskeletal: Fractures of fourth through seventh ribs on the right with mild displacement of the right seventh rib anteriorly Fractures of fourth through eighth ribs on the left with nondisplaced appearance but with a mildly displaced fracture of the left fourth costochondral elements. IMPRESSION: 1. Dense basilar consolidation but with patchy areas of airspace opacity suspicious for worsening of pneumonia superimposed on volume loss in this patient on ECMO. 2. Status post sternotomy and removal of Impella device with placement of ventricular ECMO component, tip in the left ventricular apex. 3. Small amount of hematoma in the anterior mediastinum after sternotomy. 4. Veno veno ECMO cannula tip at the brachiocephalic junction, ports of the catheter are present in SVC and upper right atrium. 5. Arterial cannula in the aorta as well. Anterior mediastinum with small hematoma and mediastinal and pericardial drain in place. 6. Signs of right axillary soft tissue dissection and surgical drain in place. 7. ET tube approximately 1.3-1.4 cm above the carina. 8. Moderately large intramuscular hematoma of the left iliacus muscle. 9. Signs of bilateral rib fractures. 10. Results with respect to basilar consolidation, tube and line position and the presence of left iliacus hematoma were called by telephone at the time of  interpretation on 06/04/2019 at 11:05 am to provider [facility] , who verbally acknowledged these results. 11. Additional findings of rib fractures were not discussed initially, will be called to the ordering clinician or representative by the Radiologist Assistant, and communication documented in the PACS or zVision Dashboard. Aortic Atherosclerosis (ICD10-I70.0). Electronically Signed   By: Zetta Bills M.D.   On: 06/04/2019 11:10   Ct Head Wo Contrast  Result Date: 06/04/2019 CLINICAL DATA:  Ataxia, possible stroke. EXAM: CT HEAD WITHOUT CONTRAST TECHNIQUE: Contiguous axial images were obtained from the base of the skull through the vertex without intravenous contrast. COMPARISON:  None. FINDINGS: Brain: Mild diffuse cortical atrophy is noted. Mild chronic ischemic white matter disease is noted. No mass effect or midline shift is noted. Ventricular size is within normal limits. There is no evidence of mass lesion, hemorrhage or acute infarction. Vascular: No hyperdense vessel or unexpected calcification. Skull: Normal. Negative for fracture or focal lesion. Sinuses/Orbits: Mild mucosal thickening is noted in both maxillary sinuses. Other: Fluid is noted in the mastoid air cells bilaterally. IMPRESSION: Mild diffuse cortical atrophy. Mild chronic ischemic white matter disease. No acute intracranial abnormality seen. Electronically Signed   By: Marijo Conception M.D.   On: 06/04/2019 10:37   Ct Chest Wo Contrast  Result Date: 06/04/2019 CLINICAL DATA:  Aortic disease, exchange of  Impella for ventricular vent. EXAM: CT CHEST, ABDOMEN, AND PELVIS without CONTRAST TECHNIQUE: Multidetector CT imaging of the chest, abdomen and pelvis was performed following the standard protocol without intravenous contrast CONTRAST:  None COMPARISON:  None. FINDINGS: CT CHEST FINDINGS Cardiovascular: Interval sternotomy and removal of Impella device with placement of ventricular ECMO component, tip in the left ventricular  apex. Small amount of hematoma in the anterior mediastinum. Also with aortic cannula in place passing through upper margin of sternotomy. Surgical drains are in place in this location. Subxiphoid mediastinal drain as well. Endotracheal tube terminates approximately 1.4 cm above the carina. Material within the bronchi peripheral to the tube. Right IJ central venous catheter terminating distal superior vena cava along with a right sided venous access device, likely PICC. Left-sided venous catheter terminates in the brachiocephalic just short of the veno veno ECMO cannula. Mediastinum/Nodes: Signs of stranding in the anterior mediastinum and small anterior mediastinal hematoma after sternotomy. Is Lungs/Pleura: Dense basilar consolidation bilaterally with patchy areas of airspace opacity. Musculoskeletal: Signs of sternotomy as described. Also with signs of right axillary dissection and surgical drain in this location. CT ABDOMEN PELVIS FINDINGS Hepatobiliary: Liver is normal with sludge in the gallbladder. Pancreas: Normal appearance of pancreas. Spleen: Normal in size without focal abnormality. Adrenals/Urinary Tract: Normal appearance of the adrenal glands. Moderate size left renal cysts with renal sinus component measure approximately 2.9 x 4.3 cm. No signs of hydronephrosis. Stomach/Bowel: No signs of bowel obstruction or acute bowel process. Fluid-filled bowel with bowel management tube in place in the rectum. Vascular/Lymphatic: Right groin venous ECMO cannula in place terminating in the superior vena cava. Vascular findings in the chest as described. Dense atherosclerotic calcification of the abdominal aorta extending into branch vessels. Stranding about the left groin without organized hematoma may reflect previous vascular access. Reproductive: Prostate unremarkable by CT. Other: Ovoid iliacus hematoma moderately large 8.1 x 4.9 x 8.9 cm. No signs of free intraperitoneal air. No signs of ascites.  Musculoskeletal: Fractures of fourth through seventh ribs on the right with mild displacement of the right seventh rib anteriorly Fractures of fourth through eighth ribs on the left with nondisplaced appearance but with a mildly displaced fracture of the left fourth costochondral elements. IMPRESSION: 1. Dense basilar consolidation but with patchy areas of airspace opacity suspicious for worsening of pneumonia superimposed on volume loss in this patient on ECMO. 2. Status post sternotomy and removal of Impella device with placement of ventricular ECMO component, tip in the left ventricular apex. 3. Small amount of hematoma in the anterior mediastinum after sternotomy. 4. Veno veno ECMO cannula tip at the brachiocephalic junction, ports of the catheter are present in SVC and upper right atrium. 5. Arterial cannula in the aorta as well. Anterior mediastinum with small hematoma and mediastinal and pericardial drain in place. 6. Signs of right axillary soft tissue dissection and surgical drain in place. 7. ET tube approximately 1.3-1.4 cm above the carina. 8. Moderately large intramuscular hematoma of the left iliacus muscle. 9. Signs of bilateral rib fractures. 10. Results with respect to basilar consolidation, tube and line position and the presence of left iliacus hematoma were called by telephone at the time of interpretation on 06/04/2019 at 11:05 am to provider [facility] , who verbally acknowledged these results. 11. Additional findings of rib fractures were not discussed initially, will be called to the ordering clinician or representative by the Radiologist Assistant, and communication documented in the PACS or zVision Dashboard. Aortic Atherosclerosis (ICD10-I70.0). Electronically Signed  By: Zetta Bills M.D.   On: 06/04/2019 11:10   Dg Chest Port 1 View  Result Date: 06/18/2019 CLINICAL DATA:  Status post CABG. EXAM: PORTABLE CHEST 1 VIEW COMPARISON:  Earlier same day FINDINGS: 1804 hours.  Endotracheal tube tip is 3.6 cm above the base of the carina. The NG tube tip is in the proximal stomach. Feeding catheter is transpyloric although the distal tip has not been included on the film. Right IJ pulmonary artery catheter tip is in the main pulmonary artery. A second right IJ line is probably in the SVC but is partially obscured. A right PICC line tip overlies the mid SVC. Left IJ central line tip projects over the central mediastinum, over the expected course of the innominate vein. This line has been pulled back in the interval since the prior study. ECMO device seen on the previous study is no longer evident. Patient is status post aortic valve replacement. Left atrial appendage occluded device noted. 2 left chest tubes evident. Lucency at the midline is compatible with pneumomediastinum and may be in part related to the median sternotomy. Bilateral diffuse airspace opacity is consistent with pulmonary edema. No substantial pleural effusion. IMPRESSION: 1. Support apparatus as above. 2. Diffuse bilateral airspace disease compatible with pulmonary edema. . Electronically Signed   By: Misty Stanley M.D.   On: 06/14/2019 18:37   Dg Chest Port 1 View  Result Date: 06/12/2019 CLINICAL DATA:  68 year old male status post open heart surgery. EXAM: PORTABLE CHEST 1 VIEW COMPARISON:  Chest radiograph dated 06/04/2019 FINDINGS: Endotracheal tube remains above the carina in similar position. Enteric tube extends below the diaphragm with side-port just distal to the GE junction and tip in the proximal stomach. Left IJ central venous line and right-sided PICC and ECMO apparatus remain in similar position. Diffuse bilateral airspace and interstitial densities as well as subpleural densities of the upper lobes appears similar to prior radiograph. No pneumothorax. Stable cardiac silhouette. No acute osseous pathology. IMPRESSION: 1. No significant interval change in the appearance of the lungs compared to prior  radiograph. 2. Support lines and tubes are in similar position. Electronically Signed   By: Anner Crete M.D.   On: 06/12/2019 08:11     Medications:     Scheduled Medications:  acetaminophen  1,000 mg Oral Q6H   Or   acetaminophen (TYLENOL) oral liquid 160 mg/5 mL  1,000 mg Per Tube Q6H   acetaminophen (TYLENOL) oral liquid 160 mg/5 mL  650 mg Per Tube Once   Or   acetaminophen  650 mg Rectal Once   arformoterol  15 mcg Nebulization BID   [START ON 06/07/2019] atorvastatin  40 mg Oral q1800   B-complex with vitamin C  1 tablet Per Tube Daily   bisacodyl  10 mg Oral Daily   Or   bisacodyl  10 mg Rectal Daily   chlorhexidine gluconate (MEDLINE KIT)  15 mL Mouth Rinse BID   Chlorhexidine Gluconate Cloth  6 each Topical Daily   docusate sodium  200 mg Oral Daily   feeding supplement (PRO-STAT SUGAR FREE 64)  30 mL Per Tube QID   guaiFENesin  30 mL Per Tube BID   insulin aspart  0-24 Units Subcutaneous Q4H   mouth rinse  15 mL Mouth Rinse 10 times per day   mupirocin ointment   Nasal BID   pantoprazole  40 mg Intravenous Q12H   polyvinyl alcohol  1 drop Both Eyes QID   sodium chloride flush  3  mL Intravenous Q12H   [START ON 06/07/2019] umeclidinium bromide  2 puff Inhalation Daily    Infusions:  sodium chloride     sodium chloride     sodium chloride     albumin human 12.5 g (06/18/2019 2057)   dexmedetomidine (PRECEDEX) IV infusion 0.7 mcg/kg/hr (06/22/2019 2319)   DOPamine 2.5 mcg/kg/min (06/10/2019 2000)   EPINEPHrine 4 mg in dextrose 5% 250 mL infusion (16 mcg/mL) 5 mcg/min (06/23/2019 2332)   famotidine (PEPCID) IV     feeding supplement (VITAL 1.5 CAL)     fentaNYL infusion INTRAVENOUS 50 mcg/hr (06/19/2019 0605)   fluconazole (DIFLUCAN) IV     lactated ringers 20 mL/hr at 06/12/2019 2331   lactated ringers 20 mL/hr at 06/17/2019 2000   meropenem (MERREM) IV 1 g (06/04/2019 0020)   milrinone 0.375 mcg/kg/min (06/02/2019 7530)   nitroGLYCERIN      norepinephrine (LEVOPHED) Adult infusion 35 mcg/min (05/27/2019 0511)   phenylephrine (NEO-SYNEPHRINE) Adult infusion Stopped (06/14/2019 1734)   prismasol BGK 4/2.5 1,800 mL/hr at 05/30/2019 0158   vancomycin 750 mg (06/04/19 1112)   vasopressin (PITRESSIN) infusion - *FOR SHOCK* 0.04 Units/min (06/09/2019 0609)    PRN Medications: sodium chloride, albumin human, docusate, heparin, influenza vaccine adjuvanted, metoprolol tartrate, midazolam, morphine injection, ondansetron (ZOFRAN) IV, oxyCODONE, pneumococcal 23 valent vaccine, sodium chloride flush, traMADol   Assessment/Plan:   1. Shock: Possible mixed cardiogenic/septic with fever and suspected PNA.  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  Echo with EF 20%, RV moderately HK.  Impella CP placed 10/23. Switched for Impella 5.0 on 10/24. No flow meter on Impella due to damage to sensor on insertion. Patient has LBBB.  PEA arrest 11/3, ECMO started 11/4. ECMO circuit changed due to high dP and rising LDH on 11/7.  On 11/9 with ongoing rise in LDH, Impella removed and LV vent placed.  11/11 ECMO decannulated and CABG-bioprosthetic AVR done, chest left open.  This morning, cardiac output has dropped significantly with CI 1.  MAP maintained in 70s.  He is on milrinone 0.375, dopamine 2.5, epinephrine 5, NE 25, vasopressin 0.04.  CVVH pulling essentially even with elevated PA pressure and markedly high CVP.  Limited echo at bedside with no definite pericardial effusion (difficult images) and EF 10-15%.  - With marked right-sided overload, aim for UF at least 50 cc/hr via CVVH.  - Increase epinephrine to 10.  - Discussed with Dr. [name] => back to OR for TEE + possible Impella 5.0 replacement.   2. CAD:  NSTEMI, hs-TnI 16,000. 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 Now s/p CABG x 4 on 11/11.  - continue ASA/statin. No b-blocker with shock 3. AKI: Baseline creatinine 1.3, now AKI  likely due to ATN from shock and contrast (had CTA for PE at admission, only 25 cc contrast with cath).  CVVH ongoing.  - Continue CVVH. Management as above 4. Acute Respiratory Failure: Has had PNA with Enterococcus faecalis in BAL cultures. Now re-intubated.  He had PEA arrest with possible aspiration 11/3 and also had pulmonary hemorrhage.  Hemoptysis appears to have stopped. CXR still with diffuse infiltrates but improved. Hopefully mostly pulmonary edema but ?ARDS component.  - CVVH management as above. - Continue vanco/meropenem/fluconazole per CCM.  5. ID: Suspected PNA, CXR with diffuse infiltrates. Enterococcus faecalis in BAL cultures initially, now with Candida albicans. Afebrile.  - Continue meropenem, vancomycin, fluconazole.  6. LBBB: Unsure  chronicity.  7. Anemia: Multiple products overnight, hgb 11.7 this morning.   8. Thrombocytopenia: HIT negative initially.  ?Low due to sepsis versus hemolysis. Platelets lower after OR yesterday, 22K => ?due to inflammation/sepsis.  Source of hemolysis removed with ECMO and Impella out. - HIT supposedly was resent but cannot find, will send again today.   9. PVCs/NSVT: Quiescent.  10. FEN: Cortrak, will need post-pyloric TFs.  11. PAF with post-termination pauses: a-paced today.  - He is no longer on amiodarone.   Patient is remains critically ill and is clinically worse this morning compared to immediately post-op.  As above, plan to go back to OR.   CRITICAL CARE Performed by: Loralie Champagne  Total critical care time: 40 minutes  Critical care time was exclusive of separately billable procedures and treating other patients.  Critical care was necessary to treat or prevent imminent or life-threatening deterioration.  Critical care was time spent personally by me on the following activities: development of treatment plan with patient and/or surrogate as well as nursing, discussions with consultants, evaluation of patient's response to  treatment, examination of patient, obtaining history from patient or surrogate, ordering and performing treatments and interventions, ordering and review of laboratory studies, ordering and review of radiographic studies, pulse oximetry and re-evaluation of patient's condition.  Loralie Champagne 06/09/2019 6:30 AM

## 2019-06-06 NOTE — Progress Notes (Signed)
Chaplain was consulted to support the family as the patient received a poor prognosis.  The chaplain contacted the daughter of the patient as well as was present with the son as the doctore notified the family of the medical concerns.  The chaplain will follow-up as needed.  Brion Aliment Chaplain Resident For questions concerning this note please contact me by pager [phone]

## 2019-06-06 NOTE — Progress Notes (Addendum)
ABG looks okay Examined patient and reviewed notes No charge from me today until [facility] decided on. Call if can be of further help.  Erskine Emery PCCM

## 2019-06-06 NOTE — Progress Notes (Signed)
1 Day Post-Op Procedure(s) (LRB): CORONARY ARTERY BYPASS GRAFTING (CABG) x 4, ON PUMP, USING LEFT INTERNAL MAMMARY ARTERY AND RIGHT GREATER SAPHENOUS VEIN HARVESTED ENDOSCOPICALLY (N/A) AORTIC VALVE REPLACEMENT (AVR) USING EDWARDS INTUITY 21MM AORTIC VALVE (N/A) TRANSESOPHAGEAL ECHOCARDIOGRAM (TEE) (N/A) Clipping Of Atrial Appendage USING 35MM ATRIAL CLIP Video Bronchoscopy REMOVAL OF  Ecmo (Extracorporeal Membrane Oxygenation) Subjective: Intubated sedated; overbreathing vent  Objective: Vital signs in last 24 hours: Temp:  [95.2 F (35.1 C)-97.2 F (36.2 C)] 95.9 F (35.5 C) (11/12 0636) Pulse Rate:  [90-98] 98 (11/11 1954) Cardiac Rhythm: Atrial fibrillation (11/12 0000) Resp:  [18-36] 36 (11/12 0636) BP: (105-117)/(60-69) 117/69 (11/11 1954) SpO2:  [90 %-98 %] 98 % (11/12 0643) Arterial Line BP: (74-108)/(46-66) 105/64 (11/12 0636) FiO2 (%):  [30 %-100 %] 100 % (11/12 0643)  Hemodynamic parameters for last 24 hours: PAP: (41-63)/(28-41) 63/41 CVP:  [15 mmHg-21 mmHg] 21 mmHg CO:  [1.8 L/min-3.1 L/min] 1.9 L/min CI:  [1 L/min/m2-1.7 L/min/m2] 1.1 L/min/m2  Intake/Output from previous day: 11/11 0701 - 11/12 0700 In: 9698.8 [I.V.:5727.5; Blood:2504; IV Piggyback:1467.3] Out: 4489 [Urine:50; Blood:800; Chest Tube:510] Intake/Output this shift: Total I/O In: 3364.7 [I.V.:1719.4; Blood:528; IV Piggyback:1117.3] Out: 3025 [Other:2785; Chest Tube:240]  General appearance: sedated Neurologic: unable to assess Heart: irregularly irregular rhythm Lungs: diminished breath sounds bilaterally Abdomen: soft, non-tender; bowel sounds normal; no masses,  no organomegaly Extremities: cool Wound: covered, dry  Lab Results: Recent Labs    05/28/2019 1811  06/15/2019 0434 06/14/2019 0442  WBC 17.6*  --   --  17.9*  HGB 13.6   < > 11.6* 11.7*  HCT 40.5   < > 34.0* 33.9*  PLT 79*  --   --  22*   < > = values in this interval not displayed.   BMET:  Recent Labs    06/24/2019 1938   06/02/2019 0434 06/09/2019 0506  NA 140   < > 139 141  K 5.6*   < > 5.4* 5.5*  CL 107  --   --  106  CO2 20*  --   --  16*  GLUCOSE 107*  --   --  121*  BUN 52*  --   --  44*  CREATININE 1.37*  --   --  1.44*  CALCIUM 7.9*  --   --  8.2*   < > = values in this interval not displayed.    PT/INR:  Recent Labs    06/01/2019 0442  LABPROT 22.8*  INR 2.1*   ABG    Component Value Date/Time   PHART 7.352 06/09/2019 0434   HCO3 17.8 (L) 06/04/2019 0434   TCO2 19 (L) 06/12/2019 0434   ACIDBASEDEF 7.0 (H) 05/27/2019 0434   O2SAT 97.0 06/17/2019 0434   CBG (last 3)  Recent Labs    06/23/2019 1850 06/22/2019 2000 06/02/2019 2348  GLUCAP 100* 109* 137*    Assessment/Plan: S/P Procedure(s) (LRB): CORONARY ARTERY BYPASS GRAFTING (CABG) x 4, ON PUMP, USING LEFT INTERNAL MAMMARY ARTERY AND RIGHT GREATER SAPHENOUS VEIN HARVESTED ENDOSCOPICALLY (N/A) AORTIC VALVE REPLACEMENT (AVR) USING EDWARDS INTUITY 21MM AORTIC VALVE (N/A) TRANSESOPHAGEAL ECHOCARDIOGRAM (TEE) (N/A) Clipping Of Atrial Appendage USING 35MM ATRIAL CLIP Video Bronchoscopy REMOVAL OF  Ecmo (Extracorporeal Membrane Oxygenation) cardiogenic shock this am; bedside echo showing markedly reduced LV function compared with yesterday  To OR for mechanical support.   LOS: 21 days    Raymond Chavez 06/01/2019

## 2019-06-07 ENCOUNTER — Inpatient Hospital Stay (HOSPITAL_COMMUNITY): Payer: PPO

## 2019-06-07 ENCOUNTER — Encounter (HOSPITAL_COMMUNITY): Payer: Self-pay | Admitting: Oncology

## 2019-06-07 DIAGNOSIS — L8990 Pressure ulcer of unspecified site, unspecified stage: Secondary | ICD-10-CM | POA: Insufficient documentation

## 2019-06-07 DIAGNOSIS — N19 Unspecified kidney failure: Secondary | ICD-10-CM

## 2019-06-07 DIAGNOSIS — D649 Anemia, unspecified: Secondary | ICD-10-CM

## 2019-06-07 DIAGNOSIS — K7200 Acute and subacute hepatic failure without coma: Secondary | ICD-10-CM

## 2019-06-07 DIAGNOSIS — I130 Hypertensive heart and chronic kidney disease with heart failure and stage 1 through stage 4 chronic kidney disease, or unspecified chronic kidney disease: Secondary | ICD-10-CM

## 2019-06-07 DIAGNOSIS — I5021 Acute systolic (congestive) heart failure: Secondary | ICD-10-CM

## 2019-06-07 DIAGNOSIS — I2510 Atherosclerotic heart disease of native coronary artery without angina pectoris: Secondary | ICD-10-CM

## 2019-06-07 LAB — POCT I-STAT 7, (LYTES, BLD GAS, ICA,H+H)
Acid-Base Excess: 10 mmol/L — ABNORMAL HIGH (ref 0.0–2.0)
Acid-Base Excess: 10 mmol/L — ABNORMAL HIGH (ref 0.0–2.0)
Acid-Base Excess: 11 mmol/L — ABNORMAL HIGH (ref 0.0–2.0)
Acid-Base Excess: 6 mmol/L — ABNORMAL HIGH (ref 0.0–2.0)
Acid-Base Excess: 6 mmol/L — ABNORMAL HIGH (ref 0.0–2.0)
Acid-Base Excess: 6 mmol/L — ABNORMAL HIGH (ref 0.0–2.0)
Acid-Base Excess: 7 mmol/L — ABNORMAL HIGH (ref 0.0–2.0)
Acid-Base Excess: 7 mmol/L — ABNORMAL HIGH (ref 0.0–2.0)
Acid-Base Excess: 9 mmol/L — ABNORMAL HIGH (ref 0.0–2.0)
Bicarbonate: 30.5 mmol/L — ABNORMAL HIGH (ref 20.0–28.0)
Bicarbonate: 30.5 mmol/L — ABNORMAL HIGH (ref 20.0–28.0)
Bicarbonate: 31 mmol/L — ABNORMAL HIGH (ref 20.0–28.0)
Bicarbonate: 32.2 mmol/L — ABNORMAL HIGH (ref 20.0–28.0)
Bicarbonate: 32.5 mmol/L — ABNORMAL HIGH (ref 20.0–28.0)
Bicarbonate: 32.5 mmol/L — ABNORMAL HIGH (ref 20.0–28.0)
Bicarbonate: 32.9 mmol/L — ABNORMAL HIGH (ref 20.0–28.0)
Bicarbonate: 33 mmol/L — ABNORMAL HIGH (ref 20.0–28.0)
Bicarbonate: 34 mmol/L — ABNORMAL HIGH (ref 20.0–28.0)
Calcium, Ion: 1.04 mmol/L — ABNORMAL LOW (ref 1.15–1.40)
Calcium, Ion: 1.06 mmol/L — ABNORMAL LOW (ref 1.15–1.40)
Calcium, Ion: 1.08 mmol/L — ABNORMAL LOW (ref 1.15–1.40)
Calcium, Ion: 1.08 mmol/L — ABNORMAL LOW (ref 1.15–1.40)
Calcium, Ion: 1.09 mmol/L — ABNORMAL LOW (ref 1.15–1.40)
Calcium, Ion: 1.09 mmol/L — ABNORMAL LOW (ref 1.15–1.40)
Calcium, Ion: 1.09 mmol/L — ABNORMAL LOW (ref 1.15–1.40)
Calcium, Ion: 1.1 mmol/L — ABNORMAL LOW (ref 1.15–1.40)
Calcium, Ion: 1.11 mmol/L — ABNORMAL LOW (ref 1.15–1.40)
HCT: 21 % — ABNORMAL LOW (ref 39.0–52.0)
HCT: 22 % — ABNORMAL LOW (ref 39.0–52.0)
HCT: 22 % — ABNORMAL LOW (ref 39.0–52.0)
HCT: 23 % — ABNORMAL LOW (ref 39.0–52.0)
HCT: 24 % — ABNORMAL LOW (ref 39.0–52.0)
HCT: 24 % — ABNORMAL LOW (ref 39.0–52.0)
HCT: 25 % — ABNORMAL LOW (ref 39.0–52.0)
HCT: 27 % — ABNORMAL LOW (ref 39.0–52.0)
HCT: 27 % — ABNORMAL LOW (ref 39.0–52.0)
Hemoglobin: 7.1 g/dL — ABNORMAL LOW (ref 13.0–17.0)
Hemoglobin: 7.5 g/dL — ABNORMAL LOW (ref 13.0–17.0)
Hemoglobin: 7.5 g/dL — ABNORMAL LOW (ref 13.0–17.0)
Hemoglobin: 7.8 g/dL — ABNORMAL LOW (ref 13.0–17.0)
Hemoglobin: 8.2 g/dL — ABNORMAL LOW (ref 13.0–17.0)
Hemoglobin: 8.2 g/dL — ABNORMAL LOW (ref 13.0–17.0)
Hemoglobin: 8.5 g/dL — ABNORMAL LOW (ref 13.0–17.0)
Hemoglobin: 9.2 g/dL — ABNORMAL LOW (ref 13.0–17.0)
Hemoglobin: 9.2 g/dL — ABNORMAL LOW (ref 13.0–17.0)
O2 Saturation: 100 %
O2 Saturation: 100 %
O2 Saturation: 100 %
O2 Saturation: 100 %
O2 Saturation: 100 %
O2 Saturation: 100 %
O2 Saturation: 100 %
O2 Saturation: 100 %
O2 Saturation: 100 %
Patient temperature: 36.6
Patient temperature: 36.6
Patient temperature: 36.7
Patient temperature: 36.8
Patient temperature: 36.9
Patient temperature: 36.9
Patient temperature: 36.9
Patient temperature: 37
Potassium: 4.3 mmol/L (ref 3.5–5.1)
Potassium: 4.3 mmol/L (ref 3.5–5.1)
Potassium: 4.3 mmol/L (ref 3.5–5.1)
Potassium: 4.4 mmol/L (ref 3.5–5.1)
Potassium: 4.5 mmol/L (ref 3.5–5.1)
Potassium: 4.5 mmol/L (ref 3.5–5.1)
Potassium: 4.5 mmol/L (ref 3.5–5.1)
Potassium: 4.6 mmol/L (ref 3.5–5.1)
Potassium: 4.6 mmol/L (ref 3.5–5.1)
Sodium: 143 mmol/L (ref 135–145)
Sodium: 144 mmol/L (ref 135–145)
Sodium: 144 mmol/L (ref 135–145)
Sodium: 145 mmol/L (ref 135–145)
Sodium: 145 mmol/L (ref 135–145)
Sodium: 145 mmol/L (ref 135–145)
Sodium: 146 mmol/L — ABNORMAL HIGH (ref 135–145)
Sodium: 146 mmol/L — ABNORMAL HIGH (ref 135–145)
Sodium: 146 mmol/L — ABNORMAL HIGH (ref 135–145)
TCO2: 32 mmol/L (ref 22–32)
TCO2: 32 mmol/L (ref 22–32)
TCO2: 32 mmol/L (ref 22–32)
TCO2: 34 mmol/L — ABNORMAL HIGH (ref 22–32)
TCO2: 34 mmol/L — ABNORMAL HIGH (ref 22–32)
TCO2: 34 mmol/L — ABNORMAL HIGH (ref 22–32)
TCO2: 34 mmol/L — ABNORMAL HIGH (ref 22–32)
TCO2: 34 mmol/L — ABNORMAL HIGH (ref 22–32)
TCO2: 35 mmol/L — ABNORMAL HIGH (ref 22–32)
pCO2 arterial: 28.9 mmHg — ABNORMAL LOW (ref 32.0–48.0)
pCO2 arterial: 36 mmHg (ref 32.0–48.0)
pCO2 arterial: 36.2 mmHg (ref 32.0–48.0)
pCO2 arterial: 38.6 mmHg (ref 32.0–48.0)
pCO2 arterial: 39.8 mmHg (ref 32.0–48.0)
pCO2 arterial: 41.4 mmHg (ref 32.0–48.0)
pCO2 arterial: 44.2 mmHg (ref 32.0–48.0)
pCO2 arterial: 52.3 mmHg — ABNORMAL HIGH (ref 32.0–48.0)
pCO2 arterial: 53.5 mmHg — ABNORMAL HIGH (ref 32.0–48.0)
pH, Arterial: 7.388 (ref 7.350–7.450)
pH, Arterial: 7.4 (ref 7.350–7.450)
pH, Arterial: 7.452 — ABNORMAL HIGH (ref 7.350–7.450)
pH, Arterial: 7.474 — ABNORMAL HIGH (ref 7.350–7.450)
pH, Arterial: 7.533 — ABNORMAL HIGH (ref 7.350–7.450)
pH, Arterial: 7.535 — ABNORMAL HIGH (ref 7.350–7.450)
pH, Arterial: 7.539 — ABNORMAL HIGH (ref 7.350–7.450)
pH, Arterial: 7.569 — ABNORMAL HIGH (ref 7.350–7.450)
pH, Arterial: 7.663 (ref 7.350–7.450)
pO2, Arterial: 427 mmHg — ABNORMAL HIGH (ref 83.0–108.0)
pO2, Arterial: 460 mmHg — ABNORMAL HIGH (ref 83.0–108.0)
pO2, Arterial: 460 mmHg — ABNORMAL HIGH (ref 83.0–108.0)
pO2, Arterial: 469 mmHg — ABNORMAL HIGH (ref 83.0–108.0)
pO2, Arterial: 504 mmHg — ABNORMAL HIGH (ref 83.0–108.0)
pO2, Arterial: 505 mmHg — ABNORMAL HIGH (ref 83.0–108.0)
pO2, Arterial: 506 mmHg — ABNORMAL HIGH (ref 83.0–108.0)
pO2, Arterial: 511 mmHg — ABNORMAL HIGH (ref 83.0–108.0)
pO2, Arterial: 521 mmHg — ABNORMAL HIGH (ref 83.0–108.0)

## 2019-06-07 LAB — COMPREHENSIVE METABOLIC PANEL
ALT: 606 U/L — ABNORMAL HIGH (ref 0–44)
ALT: 713 U/L — ABNORMAL HIGH (ref 0–44)
AST: 1296 U/L — ABNORMAL HIGH (ref 15–41)
AST: 1083 U/L — ABNORMAL HIGH (ref 15–41)
Albumin: 2.3 g/dL — ABNORMAL LOW (ref 3.5–5.0)
Albumin: 2.2 g/dL — ABNORMAL LOW (ref 3.5–5.0)
Alkaline Phosphatase: 57 U/L (ref 38–126)
Alkaline Phosphatase: 64 U/L (ref 38–126)
Anion gap: 15 (ref 5–15)
Anion gap: 17 — ABNORMAL HIGH (ref 5–15)
BUN: 65 mg/dL — ABNORMAL HIGH (ref 8–23)
BUN: 53 mg/dL — ABNORMAL HIGH (ref 8–23)
CO2: 25 mmol/L (ref 22–32)
CO2: 24 mmol/L (ref 22–32)
Calcium: 7.9 mg/dL — ABNORMAL LOW (ref 8.9–10.3)
Calcium: 7.8 mg/dL — ABNORMAL LOW (ref 8.9–10.3)
Chloride: 109 mmol/L (ref 98–111)
Chloride: 104 mmol/L (ref 98–111)
Creatinine, Ser: 1.96 mg/dL — ABNORMAL HIGH (ref 0.61–1.24)
Creatinine, Ser: 1.64 mg/dL — ABNORMAL HIGH (ref 0.61–1.24)
GFR calc Af Amer: 39 mL/min — ABNORMAL LOW (ref >60)
GFR calc Af Amer: 49 mL/min — ABNORMAL LOW (ref >60)
GFR calc non Af Amer: 34 mL/min — ABNORMAL LOW (ref >60)
GFR calc non Af Amer: 42 mL/min — ABNORMAL LOW (ref >60)
Glucose, Bld: 94 mg/dL (ref 70–99)
Glucose, Bld: 94 mg/dL (ref 70–99)
Potassium: 4.4 mmol/L (ref 3.5–5.1)
Potassium: 4.7 mmol/L (ref 3.5–5.1)
Sodium: 149 mmol/L — ABNORMAL HIGH (ref 135–145)
Sodium: 145 mmol/L (ref 135–145)
Total Bilirubin: 20.1 mg/dL (ref 0.3–1.2)
Total Bilirubin: 22.1 mg/dL (ref 0.3–1.2)
Total Protein: 3.5 g/dL — ABNORMAL LOW (ref 6.5–8.1)
Total Protein: 3.6 g/dL — ABNORMAL LOW (ref 6.5–8.1)

## 2019-06-07 LAB — APTT
aPTT: 46 seconds — ABNORMAL HIGH (ref 24–36)
aPTT: 46 seconds — ABNORMAL HIGH (ref 24–36)
aPTT: 81 seconds — ABNORMAL HIGH (ref 24–36)
aPTT: 84 seconds — ABNORMAL HIGH (ref 24–36)
aPTT: 79 seconds — ABNORMAL HIGH (ref 24–36)

## 2019-06-07 LAB — BPAM PLATELET PHERESIS
Blood Product Expiration Date: 202011142359
Blood Product Expiration Date: 202011142359
Blood Product Expiration Date: 202011142359
Blood Product Expiration Date: 202011152359
ISSUE DATE / TIME: 202011120810
ISSUE DATE / TIME: 202011120810
ISSUE DATE / TIME: 202011121346
ISSUE DATE / TIME: 202011121542
Unit Type and Rh: 1700
Unit Type and Rh: 6200
Unit Type and Rh: 6200
Unit Type and Rh: 8400

## 2019-06-07 LAB — PREPARE PLATELET PHERESIS
Unit division: 0
Unit division: 0
Unit division: 0
Unit division: 0

## 2019-06-07 LAB — RENAL FUNCTION PANEL
Albumin: 2.3 g/dL — ABNORMAL LOW (ref 3.5–5.0)
Anion gap: 13 (ref 5–15)
BUN: 57 mg/dL — ABNORMAL HIGH (ref 8–23)
CO2: 25 mmol/L (ref 22–32)
Calcium: 7.6 mg/dL — ABNORMAL LOW (ref 8.9–10.3)
Chloride: 108 mmol/L (ref 98–111)
Creatinine, Ser: 1.75 mg/dL — ABNORMAL HIGH (ref 0.61–1.24)
GFR calc Af Amer: 45 mL/min — ABNORMAL LOW (ref >60)
GFR calc non Af Amer: 39 mL/min — ABNORMAL LOW (ref >60)
Glucose, Bld: 87 mg/dL (ref 70–99)
Phosphorus: 5.2 mg/dL — ABNORMAL HIGH (ref 2.5–4.6)
Potassium: 4.6 mmol/L (ref 3.5–5.1)
Sodium: 146 mmol/L — ABNORMAL HIGH (ref 135–145)

## 2019-06-07 LAB — COOXEMETRY PANEL
Carboxyhemoglobin: 2.7 % — ABNORMAL HIGH (ref 0.5–1.5)
Methemoglobin: 1.3 % (ref 0.0–1.5)
O2 Saturation: 95.7 %
Total hemoglobin: 10 g/dL — ABNORMAL LOW (ref 12.0–16.0)

## 2019-06-07 LAB — BPAM CRYOPRECIPITATE
Blood Product Expiration Date: 202011121545
Blood Product Expiration Date: 202011121545
ISSUE DATE / TIME: 202011121006
ISSUE DATE / TIME: 202011121006
Unit Type and Rh: 5100
Unit Type and Rh: 5100

## 2019-06-07 LAB — CBC
HCT: 27.9 % — ABNORMAL LOW (ref 39.0–52.0)
HCT: 24.3 % — ABNORMAL LOW (ref 39.0–52.0)
Hemoglobin: 9.8 g/dL — ABNORMAL LOW (ref 13.0–17.0)
Hemoglobin: 8.5 g/dL — ABNORMAL LOW (ref 13.0–17.0)
MCH: 29.3 pg (ref 26.0–34.0)
MCH: 29.9 pg (ref 26.0–34.0)
MCHC: 35.1 g/dL (ref 30.0–36.0)
MCHC: 35 g/dL (ref 30.0–36.0)
MCV: 83.5 fL (ref 80.0–100.0)
MCV: 85.6 fL (ref 80.0–100.0)
Platelets: 20 10*3/uL — CL (ref 150–400)
Platelets: 14 10*3/uL — CL (ref 150–400)
RBC: 3.34 MIL/uL — ABNORMAL LOW (ref 4.22–5.81)
RBC: 2.84 MIL/uL — ABNORMAL LOW (ref 4.22–5.81)
RDW: 14.5 % (ref 11.5–15.5)
RDW: 14.9 % (ref 11.5–15.5)
WBC: 7.9 10*3/uL (ref 4.0–10.5)
WBC: 9.2 10*3/uL (ref 4.0–10.5)
nRBC: 0.6 % — ABNORMAL HIGH (ref 0.0–0.2)
nRBC: 0.4 % — ABNORMAL HIGH (ref 0.0–0.2)

## 2019-06-07 LAB — PROTIME-INR
INR: 3.7 — ABNORMAL HIGH (ref 0.8–1.2)
INR: 6.8 (ref 0.8–1.2)
INR: 6.3 (ref 0.8–1.2)
Prothrombin Time: 36.1 seconds — ABNORMAL HIGH (ref 11.4–15.2)
Prothrombin Time: 57.8 seconds — ABNORMAL HIGH (ref 11.4–15.2)
Prothrombin Time: 54.7 seconds — ABNORMAL HIGH (ref 11.4–15.2)

## 2019-06-07 LAB — PREPARE FRESH FROZEN PLASMA
Unit division: 0
Unit division: 0

## 2019-06-07 LAB — BPAM FFP
Blood Product Expiration Date: 202011162359
Blood Product Expiration Date: 202011162359
ISSUE DATE / TIME: 202011120949
ISSUE DATE / TIME: 202011120949
Unit Type and Rh: 6200
Unit Type and Rh: 6200

## 2019-06-07 LAB — ECHOCARDIOGRAM LIMITED
Height: 67 in
Weight: 3132.3 oz

## 2019-06-07 LAB — PREPARE CRYOPRECIPITATE
Unit division: 0
Unit division: 0

## 2019-06-07 LAB — SAVE SMEAR(SSMR), FOR PROVIDER SLIDE REVIEW

## 2019-06-07 LAB — FIBRINOGEN
Fibrinogen: 173 mg/dL — ABNORMAL LOW (ref 210–475)
Fibrinogen: 203 mg/dL — ABNORMAL LOW (ref 210–475)

## 2019-06-07 LAB — MAGNESIUM: Magnesium: 2.8 mg/dL — ABNORMAL HIGH (ref 1.7–2.4)

## 2019-06-07 LAB — PHOSPHORUS: Phosphorus: 6.1 mg/dL — ABNORMAL HIGH (ref 2.5–4.6)

## 2019-06-07 LAB — VANCOMYCIN, RANDOM: Vancomycin Rm: 29

## 2019-06-07 LAB — LACTIC ACID, PLASMA
Lactic Acid, Venous: 1.7 mmol/L (ref 0.5–1.9)
Lactic Acid, Venous: 1.8 mmol/L (ref 0.5–1.9)

## 2019-06-07 LAB — PREPARE RBC (CROSSMATCH)

## 2019-06-07 LAB — HEPARIN INDUCED PLATELET AB (HIT ANTIBODY): Heparin Induced Plt Ab: 0.028 OD (ref 0.000–0.400)

## 2019-06-07 MED ORDER — ORAL CARE MOUTH RINSE: 15.0000 mL | OROMUCOSAL | Status: DC

## 2019-06-07 MED ORDER — ALBUMIN HUMAN 5 % IV SOLN
INTRAVENOUS | Status: AC
  Filled 2019-06-07: qty 500

## 2019-06-07 MED ORDER — CHLORHEXIDINE GLUCONATE 0.12% ORAL RINSE (MEDLINE KIT): 15.0000 mL | Freq: Two times a day (BID) | OROMUCOSAL | Status: DC

## 2019-06-07 MED ORDER — ALBUMIN HUMAN 25 % IV SOLN
12.5000 g | Freq: Once | INTRAVENOUS | Status: AC
  Administered 2019-06-07: 12.5 g via INTRAVENOUS

## 2019-06-07 MED ORDER — SODIUM CHLORIDE 0.9% IV SOLUTION: Freq: Once | INTRAVENOUS | Status: AC

## 2019-06-07 MED ORDER — SODIUM CHLORIDE 0.9 % IV SOLN
0.5000 ug/kg/min | INTRAVENOUS | Status: DC
  Administered 2019-06-07: 14:00:00 1 ug/kg/min via INTRAVENOUS
  Administered 2019-06-08: 08:00:00 0.5 ug/kg/min via INTRAVENOUS
  Filled 2019-06-07 (×2): qty 20

## 2019-06-07 MED ORDER — SODIUM CHLORIDE 0.9 % IV SOLN: 1.0000 g | INTRAVENOUS | Status: DC

## 2019-06-07 MED ORDER — SODIUM CHLORIDE 0.9 % IV SOLN
0.0100 mg/kg/h | INTRAVENOUS | Status: DC
  Administered 2019-06-07: 0.03 mg/kg/h via INTRAVENOUS
  Filled 2019-06-07: qty 250

## 2019-06-07 MED ORDER — ARTIFICIAL TEARS OPHTHALMIC OINT
1.0000 "application " | TOPICAL_OINTMENT | Freq: Three times a day (TID) | OPHTHALMIC | Status: DC
  Administered 2019-06-07 – 2019-06-08 (×3): 1 via OPHTHALMIC

## 2019-06-07 MED ORDER — ALBUMIN HUMAN 5 % IV SOLN
INTRAVENOUS | Status: AC
  Filled 2019-06-07: qty 750

## 2019-06-07 MED ORDER — ALBUMIN HUMAN 5 % IV SOLN
12.5000 g | Freq: Once | INTRAVENOUS | Status: AC
  Administered 2019-06-07: 12.5 g via INTRAVENOUS

## 2019-06-07 MED ORDER — SODIUM CHLORIDE 0.9 % IV SOLN
1.0000 g | Freq: Three times a day (TID) | INTRAVENOUS | Status: DC
  Administered 2019-06-07 – 2019-06-08 (×4): 1 g via INTRAVENOUS
  Filled 2019-06-07 (×6): qty 1

## 2019-06-07 NOTE — Progress Notes (Addendum)
Pharmacy Anti-Coagulation Consult Note:  Pharmacy Consult for Heparin to bivalirudin Indication for heparin: ECMO  No Known Allergies  Patient Measurements: Height: 5\' 7"  (170.2 cm)(measured x3) Weight: 195 lb 12.3 oz (88.8 kg)(Weighed with PreValon Boots; unable to take off d/t unstable) IBW/kg (Calculated) : 66.1  Vital Signs: Temp: 98.2 F (36.8 C) (11/13 1736) Temp Source: Core (11/13 1736) Pulse Rate: 90 (11/13 1736)  Labs: Recent Labs    06/04/19 2129  06/05/2019 1640  06/07/19 0421  06/07/19 1335  06/07/19 1729 06/07/19 1754 06/07/19 1832  HGB 7.1*   < > 12.2*   < > 9.8*   < >  --    < > 7.1* 8.5* 7.5*  HCT 21.4*   < > 34.8*   < > 27.9*   < >  --    < > 21.0* 24.3* 22.0*  PLT 43*   < > 43*  --  20*  --   --   --   --  14*  --   APTT 44*   < > 51*   < > 46*  --  81*  --   --  84*  --   LABPROT  --    < > 31.8*  --  36.1*  --  57.8*  --   --  54.7*  --   INR  --    < > 3.1*  --  3.7*  --  6.8*  --   --  6.3*  --   HEPARINUNFRC <0.10*  --   --   --   --   --   --   --   --   --   --   CREATININE  --    < > 1.67*  --  1.96*  --  1.75*  --   --   --   --    < > = values in this interval not displayed.    Estimated Creatinine Clearance: 42.4 mL/min (A) (by C-G formula based on SCr of 1.75 mg/dL (H)).  Assessment: 68 yo M admitted with shock and concern for ACS now s/p Impella CP placement in cath lab. Impella CP swapped out for 5.0 d/t hemolysis. Underwent [facility] ECMO with goal of CABG for revascularization. ECMO initiated 11/4 pm and pt was started on heparin. This has intermittently been held with bleeding, pt most recently on 1100 units/h systemic only after Impella pulled 11/9 with aPTTs ~50s.  Pt underwent CABG + AVR on 11/11 then began decompensating requiring re-cannulation of ECMO on 11/12. Pt has remained extremely thrombocytopenic, HIT panel previously negative, resent per MD on 11/12 (pending). Heparin was held most of 11/12 with concern pt was too unstable to  survive and GOC discussion ongoing, now is more stable so care is continued.  Pt noted to have LV clot 11/13 am, likely due to LV vent clotting off. Pharmacy asked to resume anticoagulation. Dr. 13/12, Vickey Sages, and [place] all discussed. With ongoing thrombocytopenia and concern for HIT, will continue on bivalirudin while HIT is pending. Discussed risks with bivalirudin related to poor kidney function and and inability to readily reverse DTI - team wishes to proceed. Will begin very slowly and all dose adjustments will need to be communicated via Dr. Garciaburgh.   APTT remains elevated at 84 sec.  Patient has blood loss from earlier this afternoon from washout.  Discussed with Dr. Vickey Sages, will reduce bivalirudin - should the next aPTT remains elevated, will likely continue the course as  bivalirudin is infusing at a very low rate of 1.8 ml/hr.  Question the accuracy of aPTT in setting of liver dysfunction.  Goal of Therapy:  Goal aPTT 50-70 seconds Monitor platelets by anticoagulation protocol: Yes   Plan:  Reduce bivalirudin to 0.01 mg/kg/hr Check aPTT in 4hrs  Keigan Tafoya D. Mina Marble, PharmD, BCPS, [place] 06/07/2019, 7:11 PM

## 2019-06-07 NOTE — Progress Notes (Signed)
Patient restarted on CRRT today, pulling 50 at most, Dr. [name] open patient up at bedside for wash out, some meds were not given during procedure and rescheduled for later, gave 1PRBC's after procedure. Inform night nurse of events.

## 2019-06-07 NOTE — Progress Notes (Signed)
  Echocardiogram 2D Echocardiogram limtied has been performed.  Raymond Chavez M 06/07/2019, 9:26 AM

## 2019-06-07 NOTE — Progress Notes (Addendum)
Pharmacy Anti-Coagulation Consult Note:  Pharmacy Consult for Heparin to bivalirudin Indication for heparin: ECMO  No Known Allergies  Patient Measurements: Height: 5\' 7"  (170.2 cm)(measured x3) Weight: 195 lb 12.3 oz (88.8 kg)(Weighed with PreValon Boots; unable to take off d/t unstable) IBW/kg (Calculated) : 66.1  Vital Signs: Temp: 98.4 F (36.9 C) (11/13 0730) Temp Source: Core (11/13 0730) BP: 0/0 (11/12 2208) Pulse Rate: 90 (11/13 0730)  Labs: Recent Labs    06/04/19 2129  06/05/2019 1121 06/21/2019 1221  06/22/2019 1640 06/17/2019 1957  06/07/19 0135 06/07/19 0421 06/07/19 0546  HGB 7.1*   < > 9.5* 12.3*   < > 12.2*  --    < > 8.2* 9.8* 8.2*  HCT 21.4*   < > 28.0* 36.1*   < > 34.8*  --    < > 24.0* 27.9* 24.0*  PLT 43*   < >  --  38*  --  43*  --   --   --  20*  --   APTT 44*   < >  --  53*  --  51* 46*  --   --  46*  --   LABPROT  --    < >  --  29.1*  --  31.8*  --   --   --  36.1*  --   INR  --    < >  --  2.8*  --  3.1*  --   --   --  3.7*  --   HEPARINUNFRC <0.10*  --   --   --   --   --   --   --   --   --   --   CREATININE  --    < > 1.40*  --   --  1.67*  --   --   --  1.96*  --    < > = values in this interval not displayed.    Estimated Creatinine Clearance: 37.8 mL/min (A) (by C-G formula based on SCr of 1.96 mg/dL (H)).  Assessment: 68 yo M admitted with shock and concern for ACS now s/p Impella CP placement in cath lab. Impella CP swapped out for 5.0 d/t hemolysis. Underwent [facility] ECMO with goal of CABG for revascularization. ECMO initiated 11/4 pm and pt was started on heparin. This has intermittently been held with bleeding, pt most recently on 1100 units/h systemic only after Impella pulled 11/9 with aPTTs ~50s.  Pt underwent CABG + AVR on 11/11 then began decompensating requiring re-cannulation of ECMO on 11/12. Pt has remained extremely thrombocytopenic, HIT panel previously negative, resent per MD on 11/12 (pending). Heparin was held most of 11/12 with  concern pt was too unstable to survive and GOC discussion ongoing, now is more stable so care is continued.  Pt noted to have LV clot 11/13 am, likely due to LV vent clotting off. Pharmacy asked to resume anticoagulation. Dr. 13/12, Vickey Sages, and [place] all discussed. With ongoing thrombocytopenia and concern for HIT, will begin with bivalirudin for now while HIT is pending. Discussed risks with bivalirudin related to poor kidney function and and inability to readily reverse DTI - team wishes to proceed. Will begin very slowly and all dose adjustments will need to be communicated via Dr. Garciaburgh. Baseline aPTT is this morning is 46s, INR remains elevated (likely due to shocked liver).   Goal of Therapy:  Goal aPTT 50-70 seconds Monitor platelets by anticoagulation protocol: Yes   Plan:  Bivalirudin  0.03mg /kg/hr Check aPTT in 4h    ADDENDUM: Initial aPTT is 81 seconds, INR up to 6.7, CT output is stable, ECMO flow stable.  Plan: Reduce bivalirudin to 0.02mg /kg/hr Recheck aPTT in 4h   Arrie Senate, PharmD, BCPS Clinical Pharmacist [phone] Please check AMION for all [place] numbers 06/07/2019

## 2019-06-07 NOTE — Progress Notes (Signed)
  Limited echo reviewed at bedside, there is thrombus at the LV apex and in the left atrium crossing the mitral valve.   Difficult situation, plts 20K and had wanted to hold anticoagulation.    Will start patient on bivalirudin.  HIT negative x 1 but pending repeat HIT. With clotting, think high risk of HIT.    Discussed with Dr. [name], will ask hematogy to review given prolonged depressed platelet count, any benefit from steroid use?   Loralie Champagne 06/07/2019 9:46 AM

## 2019-06-07 NOTE — Progress Notes (Signed)
Dr. [name] informed of AM labs and ABG. Current gtt rates and reviewed cardiohelp values reviewed with MD. Plan is to discuss with rounding team this AM.

## 2019-06-07 NOTE — Progress Notes (Signed)
[facility] NEPHROLOGY PROGRESS NOTE  Assessment/ Plan: Pt is a 68 y.o. yo male with cardiogenic shock who underwent placement of Impella on 10/24, on milrinone and Levophed, cardiac cath with three-vessel disease and echocardiogram revealing EF of 20%, respiratory failure, consulted for AKI.  Baseline creatinine level around 1.3.  #Anuric acute kidney injury on CKD: due to cardiorenal syndrome and contrast nephropathy, complicated by cardiac arrest on 11/3. CRRT since 10/30.  Currently tolerating CRRT.  Continue all 4K solution at current flow settings.  Goal net even with AHF  TCTS assisting with fluid management., sig losses from chest tube. No clotting.  On bivalidrudin given TCP  #Cardiac arrest/cardiogenic shock: EF 20%, on ECMO.  Had  cardiac arrest on 11/3.   Heart failure team is following.  CABG and AVF 11/11, remains ECMO dependent.   #Acute respiratory failure with hypoxia: Due to CHF and pneumonia. On mechanical ventilation and ECMO. CCM following  #CAD: NSTEMI, CABG with severe three-vessel disease. As above.   # Severe TCP  #Hypophosphatemia, hypocalcemia: stable,     Subjective:   Back on ECMO  Resuming CRRT  K 4.4, P 6.1  Severe TCP  Objective Vital signs in last 24 hours: Vitals:   06/07/19 1130 06/07/19 1141 06/07/19 1145 06/07/19 1200  BP:      Pulse:      Resp: (!) 0  (!) 0 (!) 0  Temp:      TempSrc:      SpO2: 100% 100% 100% 100%  Weight:      Height:       Weight change:   Intake/Output Summary (Last 24 hours) at 06/07/2019 1202 Last data filed at 06/07/2019 1200 Gross per 24 hour  Intake 3561.32 ml  Output 1557 ml  Net 2004.32 ml       Labs: Basic Metabolic Panel: Recent Labs  Lab 05/31/2019 0433  06/04/2019 0506  06/12/2019 1121  06/24/2019 1640 06/04/2019 1745  06/07/19 0421 06/07/19 0546 06/07/19 1108  NA 138   < > 141   < > 143   < > 145  --    < > 149* 146* 145  K 4.9   < > 5.5*   < > 5.3*   < > 5.0  --    < > 4.4  4.3 4.4  CL 105   < > 106   < > 105  --  106  --   --  109  --   --   CO2 23   < > 16*  --   --   --  26  --   --  25  --   --   GLUCOSE 104*   < > 121*   < > 123*  --  116*  --   --  94  --   --   BUN 45*   < > 44*   < > 47*  --  48*  --   --  65*  --   --   CREATININE 1.29*   < > 1.44*   < > 1.40*  --  1.67*  --   --  1.96*  --   --   CALCIUM 7.3*   < > 8.2*  --   --   --  8.2*  --   --  7.9*  --   --   PHOS 2.7  --   --   --   --   --   --  7.1*  --  6.1*  --   --    < > = values in this interval not displayed.   Liver Function Tests: Recent Labs  Lab 06/02/2019 0506 06/02/2019 1640 06/07/19 0421  AST 116* 1,428* 1,296*  ALT 32 454* 606*  ALKPHOS 120 62 57  BILITOT 10.0* 13.9* 20.1*  PROT 5.2* 3.5* 3.5*  ALBUMIN 3.0* 2.2* 2.3*   No results for input(s): LIPASE, AMYLASE in the last 168 hours. No results for input(s): AMMONIA in the last 168 hours. CBC: Recent Labs  Lab 06/02/19 1150  06/02/2019 1811  06/16/2019 0442  05/31/2019 1221  06/07/2019 1640  06/07/19 0421 06/07/19 0546 06/07/19 1108  WBC 14.9*   < > 17.6*  --  17.9*  --  9.9  --  7.7  --  7.9  --   --   NEUTROABS 13.6*  --  15.1*  --   --   --   --   --   --   --   --   --   --   HGB 8.1*   < > 13.6   < > 11.7*   < > 12.3*   < > 12.2*   < > 9.8* 8.2* 8.5*  HCT 24.1*   < > 40.5   < > 33.9*   < > 36.1*   < > 34.8*   < > 27.9* 24.0* 25.0*  MCV 93.1   < > 88.8  --  87.1  --  89.1  --  84.7  --  83.5  --   --   PLT 32*   < > 79*  --  22*   < > 38*  --  43*  --  20*  --   --    < > = values in this interval not displayed.   Cardiac Enzymes: No results for input(s): CKTOTAL, CKMB, CKMBINDEX, TROPONINI in the last 168 hours. CBG: Recent Labs  Lab 05/30/2019 0425 05/28/2019 1212 06/15/2019 1404 06/04/2019 2000 06/09/2019 2341  GLUCAP 117* 90 89 91 92    Iron Studies: No results for input(s): IRON, TIBC, TRANSFERRIN, FERRITIN in the last 72 hours. Studies/Results: Dg Chest Port 1 View  Result Date: 06/07/2019 CLINICAL DATA:   Intubation.  Chest tube. EXAM: PORTABLE CHEST 1 VIEW COMPARISON:  Left 06/2019. FINDINGS: Endotracheal tube, NG tube, left IJ line, Swan-Ganz catheter, mediastinal drainage tubes, bilateral chest tubes, ECMO device in stable position. No pneumothorax. Prior cardiac valve replacement. Left atrial appendage clip noted stable position. Heart size stable. Diffuse bilateral pulmonary infiltrates/edema again noted. No prominent pleural effusion. IMPRESSION: 1. Lines and tubes including bilateral chest tubes in stable position. No pneumothorax. 2. Prior cardiac valve replacement. Left atrial appendage clip in stable position. Heart size stable. 3. Diffuse bilateral pulmonary infiltrates/edema again noted without interim change. Electronically Signed   By: Marcello Moores  Register   On: 06/07/2019 07:29   Dg Chest Port 1 View  Result Date: 06/16/2019 CLINICAL DATA:  Hypotension post ECMO.  Recent CABG. EXAM: PORTABLE CHEST 1 VIEW COMPARISON:  Chest x-ray from same day. FINDINGS: Unchanged endotracheal and enteric tubes. Unchanged bilateral chest tubes and mediastinal drain. Unchanged right internal jugular Swan-Ganz catheter with tip in the main pulmonary artery. Unchanged right upper extremity PICC line and bilateral internal jugular central venous catheters. Unchanged ECMO catheters. Normal heart size. Prior AVR and left atrial appendage clipping. Hazy airspace disease both lungs, greater on the left, similar to prior study. No pleural effusion or pneumothorax.  No acute osseous abnormality. IMPRESSION: 1. Stable support tubes and lines.  No pneumothorax. 2. Unchanged diffuse pulmonary edema. Electronically Signed   By: Titus Dubin M.D.   On: 06/02/2019 14:01   Dg Chest Port 1 View  Result Date: 06/19/2019 CLINICAL DATA:  Status post open heart surgery. EXAM: PORTABLE CHEST 1 VIEW COMPARISON:  Same day. FINDINGS: Stable position of endotracheal, nasogastric tubes, bilateral jugular catheters and right-sided PICC  line. Stable bilateral chest tubes are noted without pneumothorax. Stable bilateral lung opacities are noted. Bony thorax is unremarkable. IMPRESSION: Stable support apparatus. Stable bilateral lung opacities. Stable bilateral chest tubes without pneumothorax. Electronically Signed   By: Marijo Conception M.D.   On: 05/27/2019 13:12   Dg Chest Port 1 View  Result Date: 06/20/2019 CLINICAL DATA:  Endotracheal tube. EXAM: PORTABLE CHEST 1 VIEW COMPARISON:  June 05, 2019. FINDINGS: Endotracheal and nasogastric tubes are unchanged in position. Stable bilateral jugular catheters are noted. Right internal jugular catheter is noted with distal tip in expected position of main pulmonary artery. Bilateral chest tubes are noted without pneumothorax. Stable feeding tube is seen entering stomach. Slightly improved bilateral lung opacities are noted suggesting improving pulmonary edema. Bony thorax is unremarkable. IMPRESSION: Stable support apparatus. Stable bilateral chest tubes without pneumothorax. Improved bilateral lung opacities are noted. Electronically Signed   By: Marijo Conception M.D.   On: 06/15/2019 07:10   Dg Chest Port 1 View  Result Date: 05/27/2019 CLINICAL DATA:  Status post CABG. EXAM: PORTABLE CHEST 1 VIEW COMPARISON:  Earlier same day FINDINGS: 1804 hours. Endotracheal tube tip is 3.6 cm above the base of the carina. The NG tube tip is in the proximal stomach. Feeding catheter is transpyloric although the distal tip has not been included on the film. Right IJ pulmonary artery catheter tip is in the main pulmonary artery. A second right IJ line is probably in the SVC but is partially obscured. A right PICC line tip overlies the mid SVC. Left IJ central line tip projects over the central mediastinum, over the expected course of the innominate vein. This line has been pulled back in the interval since the prior study. ECMO device seen on the previous study is no longer evident. Patient is status post  aortic valve replacement. Left atrial appendage occluded device noted. 2 left chest tubes evident. Lucency at the midline is compatible with pneumomediastinum and may be in part related to the median sternotomy. Bilateral diffuse airspace opacity is consistent with pulmonary edema. No substantial pleural effusion. IMPRESSION: 1. Support apparatus as above. 2. Diffuse bilateral airspace disease compatible with pulmonary edema. . Electronically Signed   By: Misty Stanley M.D.   On: 06/22/2019 18:37    Medications: Infusions: .  prismasol BGK 4/2.5 400 mL/hr at 06/10/2019 1602  .  prismasol BGK 4/2.5 300 mL/hr at 06/02/2019 1602  . sodium chloride    . sodium chloride    . sodium chloride    . bivalirudin (ANGIOMAX) infusion 0.5 mg/mL (Non-ACS indications) 0.03 mg/kg/hr (06/07/19 1200)  . dexmedetomidine (PRECEDEX) IV infusion 0.7 mcg/kg/hr (06/07/19 1200)  . DOPamine 2.5 mcg/kg/min (05/31/2019 2000)  . EPINEPHrine 4 mg in dextrose 5% 250 mL infusion (16 mcg/mL) Stopped (06/02/2019 2141)  . feeding supplement (VITAL 1.5 CAL)    . fentaNYL infusion INTRAVENOUS 200 mcg/hr (06/07/19 1200)  . fluconazole (DIFLUCAN) IV 400 mg (06/07/19 1200)  . insulin    . lactated ringers 20 mL/hr at 06/12/2019 2331  . lactated ringers 20 mL/hr  at 06/07/19 1200  . meropenem (MERREM) IV Stopped (06/07/19 7915)  . midazolam 6 mg/hr (06/07/19 1200)  . milrinone Stopped (06/17/2019 1813)  . norepinephrine (LEVOPHED) Adult infusion 20 mcg/min (06/09/2019 1149)  . pantoprozole (PROTONIX) infusion 8 mg/hr (06/07/19 0005)  . phenylephrine (NEO-SYNEPHRINE) Adult infusion Stopped (06/16/2019 1734)  . prismasol BGK 4/2.5 1,800 mL/hr at 06/20/2019 1602  . vasopressin (PITRESSIN) infusion - *FOR SHOCK* Stopped (06/07/19 0503)    Scheduled Medications: . sodium chloride   Intravenous Once  . acetaminophen  1,000 mg Oral Q6H   Or  . acetaminophen (TYLENOL) oral liquid 160 mg/5 mL  1,000 mg Per Tube Q6H  . arformoterol  15 mcg  Nebulization BID  . atorvastatin  40 mg Per Tube q1800  . B-complex with vitamin C  1 tablet Per Tube Daily  . bisacodyl  10 mg Oral Daily   Or  . bisacodyl  10 mg Rectal Daily  . chlorhexidine gluconate (MEDLINE KIT)  15 mL Mouth Rinse BID  . Chlorhexidine Gluconate Cloth  6 each Topical Daily  . docusate  200 mg Per Tube Daily  . feeding supplement (PRO-STAT SUGAR FREE 64)  30 mL Per Tube QID  . guaiFENesin  30 mL Per Tube BID  . mouth rinse  15 mL Mouth Rinse 10 times per day  . mupirocin ointment   Nasal BID  . polyvinyl alcohol  1 drop Both Eyes QID  . sodium chloride flush  3 mL Intravenous Q12H  . umeclidinium bromide  2 puff Inhalation Daily  . vancomycin variable dose per unstable renal function (pharmacist dosing)   Does not apply See admin instructions    have reviewed scheduled and prn medications.  Physical Exam:  General: Intubated and sedated, critically ill Heart:RRR, s1s2 nl Lungs: Bibasal coarse breath sound, no wheezing Abdomen:soft, Non-tender, non-distended Extremities: LE edema + Dialysis Access: Left IJ catheter site clean.  Multiple line for ECMO.  [place] 06/07/2019,12:02 PM  LOS: 22 days

## 2019-06-07 NOTE — Progress Notes (Signed)
Pharmacy Antibiotic Note  Raymond Chavez is a 68 y.o. male admitted on 05/11/2019 with cardiogenic shock requiring placement of ECMO and CRRT. Pharmacy has been consulted for vancomycin and meropenem dosing. Pt went for CABG + AVR on 11/11 and was de-cannulated, now back on ECMO with further decompensation.   Pt has received extra vancomycin dosing with frequent surgical trips over the past few days, random level this morning is 29 mcg/ml so will hold. CRRT was held overnight, attempting to restart today.  Plan: Hold vancomycin for now, recheck random level tomorrow Meropenem 1g IV q8h Fluconazole 426m IV q24h   Height: _0  (170.2 cm)(measured x3) Weight: 195 lb 12.3 oz (88.8 kg)(Weighed with PreValon Boots; unable to take off d/t unstable) IBW/kg (Calculated) : 66.1  Temp (24hrs), Avg:98.3 F (36.8 C), Min:98.1 F (36.7 C), Max:98.4 F (36.9 C)  Recent Labs  Lab 06/01/19 1016  06/04/19 1640  06/07/2019 0435  06/12/2019 1811  06/21/2019 0418 05/30/2019 0442  06/17/2019 0934 05/26/2019 1026 06/23/2019 1121 05/30/2019 1221 06/19/2019 1640 06/07/19 0421 06/07/19 0830  WBC  --    < > 8.4   < >  --   --  17.6*  --   --  17.9*  --   --   --   --  9.9 7.7 7.9  --   CREATININE  --    < > 1.37*   < >  --    < >  --    < >  --   --    < > 1.50* 1.50* 1.40*  --  1.67* 1.96*  --   LATICACIDVEN  --    < > 1.5  --  1.4  --   --   --  6.7*  --   --   --   --   --   --  3.7* 1.7  --   VANCOTROUGH 14*  --   --   --   --   --   --   --   --   --   --   --   --   --   --   --   --   --   VANCORANDOM  --   --   --   --   --   --   --   --   --   --   --   --   --   --   --   --   --  29   < > = values in this interval not displayed.    Estimated Creatinine Clearance: 37.8 mL/min (A) (by C-G formula based on SCr of 1.96 mg/dL (H)).    No Known Allergies  Antimicrobials this admission: Cefepime 10/22>>10/26, 10/29>>11/1 Vancomycin 10/22>>10/25, 10/28>>11/1, 11/4>> Ampicillin 11/1>>11/4 Meropenem  11/4>> Fluconazole 11/9 >>   Microbiology results: 11/9 BAL: 80k candida albicans 11/8 Resp Cx - few candida albicans 11/4 MRSA PCR + 10/29 BCx - negF 10/28 BAL - 40k e faecalis - S to vanc/amp 10/24 TA - normal flora 10/22BCx: ngF 10/22UCx:ng 10/22 Respiratory PCR: negative 10/22 COVID: negative  Thank you for allowing pharmacy to be a part of this patient's care.  MArrie Senate PharmD, BCPS Clinical Pharmacist [phone] check AMION for all MCountrysidenumbers 06/07/2019

## 2019-06-07 NOTE — Progress Notes (Signed)
[facility] Progress Note Patient Name: Jaryan Chicoine DOB: 02-02-1951 MRN: [id number]   Date of Service  06/07/2019  HPI/Events of Note  PH 7.59,  PCO2 35 on repeat ABG with respiratory rate of 20  eICU Interventions  RR reduced to 15        Meranda Dechaine U Twila Rappa 06/07/2019, 12:47 AM

## 2019-06-07 NOTE — Progress Notes (Signed)
Patient ID: Raymond Chavez, male   DOB: Aug 09, 1950, 68 y.o.   MRN: [id number]    Advanced Heart Failure Rounding Note   Subjective:    Events - Cath 10/23 with sever 3v CAD and cardiogenic shock. Impella CP placed - Underwent placement of Impella 5.0 on 10/24 with removal of R femoral Impella CP.  - Extubated 11/1, CVVH held on 11/3 given  - He had a possible aspiration event afternoon 11/3 then became progressively hypoxemic during the night.  He had to be re-intubated.  Bronchoscopy showed copious blood return suggesting hemoptysis. He developed PEA arrest during this time and underwent CPR  -11/4 initiate ECMO.  Patient went to OR, arterial cannula in ascending aorta and venous cannula in right femoral vein. Impella remains in place at P2.  - 11/7 ECMO circuit change for high dP - 11/9 Impella removed, LV vent placed.  Moderate AI noted on TEE after Impella pulled.  Yeast in BAL, started on fluconazole.  - 11/11 to OR for CABG x 4 + bioprosthetic AoV + LA clip and VA ECMO decannulation. Chest left open.  - 11/13 back to OR to re-initiate ECMO, cannulas in ascending aorta and RA.   Remains intubated/sedated.  CVVH off as MAP had not tolerated but now off all pressors with MAP 70s.  CVP 10.   He is on vancomycin/meropenem/fluconazole. Enterococcus faecalis, Candida albicans on BAL cultures.    Developed AF. Was on amiodarone, now off.  He is a-v sequentially paced.     Plts back down to 20K this morning. Hgb 9.8 after multiple products.  HIT pending.   AST 1296, ALT 606.   ECMO:  Flow 4.6 L/min 3200 rpm dP 26   Objective:   Weight Range:  Vital Signs:   Temp:  [98.1 F (36.7 C)-98.4 F (36.9 C)] 98.4 F (36.9 C) (11/13 0400) Pulse Rate:  [90] 90 (11/12 2130) Resp:  [0-25] 0 (11/13 0630) BP: (0)/(0) 0/0 (11/12 2208) SpO2:  [97 %-100 %] 99 % (11/13 0530) Arterial Line BP: (0-231)/(0-108) 79/66 (11/13 0530) FiO2 (%):  [30 %] 30 % (11/13 0427) Weight:  [88.8 kg] 88.8 kg  (11/13 0530) Last BM Date: 06/24/2019  Weight change: Filed Weights   06/04/19 0432 06/13/2019 0400 06/07/19 0530  Weight: 72.3 kg 72.4 kg 88.8 kg    Intake/Output:   Intake/Output Summary (Last 24 hours) at 06/07/2019 0731 Last data filed at 06/07/2019 0600 Gross per 24 hour  Intake 6404.27 ml  Output 1300 ml  Net 5104.27 ml     Physical Exam:  General: NAD. ECMO cannulas in chest.  Neck: JVP 8-9 cm, no thyromegaly or thyroid nodule.  Lungs: Coarse BS bilaterally CV: Nondisplaced PMI.  Heart regular S1/S2, no S3/S4, no murmur.  1+ edema to knees bilaterally.   Abdomen: Soft, nontender, no hepatosplenomegaly, mild distention.  Skin: Intact without lesions or rashes.  Neurologic: Sedated.  Extremities: No clubbing or cyanosis.  HEENT: Normal.   Telemetry: a-v sequentially (personally reviewed)  Labs: Basic Metabolic Panel: Recent Labs  Lab 06/01/19 0316  06/01/19 1555  05/30/2019 1013  06/04/19 1313  06/19/2019 0433  06/18/2019 1938  05/28/2019 0418  06/01/2019 0506  06/09/2019 0934 06/18/2019 1026  06/01/2019 1121  06/23/2019 1640 06/13/2019 1745 06/10/2019 2243 05/26/2019 2344 06/07/19 0135 06/07/19 0421 06/07/19 0546  NA 137   < > 140   < >  --    < >  --    < > 138   < > 140   < >  --    < >  141   < > 145 145   < > 143   < > 145  --  145 146* 145 149* 146*  K 4.6   < > 4.8   < >  --    < >  --    < > 4.9   < > 5.6*   < >  --    < > 5.5*   < > 5.4* 5.5*   < > 5.3*   < > 5.0  --  4.3 4.3 4.3 4.4 4.3  CL 105  --  107   < >  --    < >  --    < > 105   < > 107  --   --   --  106   < > 103 102  --  105  --  106  --   --   --   --  109  --   CO2 24  --  24   < >  --    < >  --    < > 23  --  20*  --   --   --  16*  --   --   --   --   --   --  26  --   --   --   --  25  --   GLUCOSE 210*  --  176*   < >  --    < >  --    < > 104*   < > 107*  --   --   --  121*   < > 106* 130*  --  123*  --  116*  --   --   --   --  94  --   BUN 32*  --  30*   < >  --    < >  --    < > 45*   < > 52*  --    --   --  44*   < > 49* 50*  --  47*  --  48*  --   --   --   --  65*  --   CREATININE 1.35*  --  1.26*   < >  --    < >  --    < > 1.29*   < > 1.37*  --   --   --  1.44*   < > 1.50* 1.50*  --  1.40*  --  1.67*  --   --   --   --  1.96*  --   CALCIUM 7.2*  --  7.1*   < >  --    < >  --    < > 7.3*  --  7.9*  --   --   --  8.2*  --   --   --   --   --   --  8.2*  --   --   --   --  7.9*  --   MG 2.6*  --  2.4  --   --   --   --   --   --   --   --   --  3.3*  --   --   --   --   --   --   --   --  2.9*  --   --   --   --  2.8*  --   PHOS  --   --   --    < > 2.9  --  4.3  --  2.7  --   --   --   --   --   --   --   --   --   --   --   --   --  7.1*  --   --   --  6.1*  --    < > = values in this interval not displayed.    Liver Function Tests: Recent Labs  Lab 06/02/2019 0433 06/19/2019 1938 06/20/2019 0506 05/31/2019 1640 06/07/19 0421  AST 29 95* 116* 1,428* 1,296*  ALT 19 24 32 454* 606*  ALKPHOS 71 82 120 62 57  BILITOT 2.4* 12.3* 10.0* 13.9* 20.1*  PROT 4.5* 4.6* 5.2* 3.5* 3.5*  ALBUMIN 2.5* 2.7* 3.0* 2.2* 2.3*   No results for input(s): LIPASE, AMYLASE in the last 168 hours. No results for input(s): AMMONIA in the last 168 hours.  CBC: Recent Labs  Lab 06/02/19 1150  06/09/2019 1811  06/24/2019 0442  06/24/2019 0855  06/23/2019 1221  06/24/2019 1640 06/13/2019 2243 06/04/2019 2344 06/07/19 0135 06/07/19 0421 06/07/19 0546  WBC 14.9*   < > 17.6*  --  17.9*  --   --   --  9.9  --  7.7  --   --   --  7.9  --   NEUTROABS 13.6*  --  15.1*  --   --   --   --   --   --   --   --   --   --   --   --   --   HGB 8.1*   < > 13.6   < > 11.7*   < > 8.0*   < > 12.3*   < > 12.2* 8.8* 9.2* 8.2* 9.8* 8.2*  HCT 24.1*   < > 40.5   < > 33.9*   < > 23.9*   < > 36.1*   < > 34.8* 26.0* 27.0* 24.0* 27.9* 24.0*  MCV 93.1   < > 88.8  --  87.1  --   --   --  89.1  --  84.7  --   --   --  83.5  --   PLT 32*   < > 79*  --  22*  --  10*  --  38*  --  43*  --   --   --  20*  --    < > = values in this interval not  displayed.    Cardiac Enzymes: No results for input(s): CKTOTAL, CKMB, CKMBINDEX, TROPONINI in the last 168 hours.  BNP: BNP (last 3 results) Recent Labs    05/12/2019 1739 05/13/2019 1234  BNP 2,725.3* 3,370.0*    ProBNP (last 3 results) No results for input(s): PROBNP in the last 8760 hours.    Other results:  Imaging: Dg Chest Port 1 View  Result Date: 05/27/2019 CLINICAL DATA:  Hypotension post ECMO.  Recent CABG. EXAM: PORTABLE CHEST 1 VIEW COMPARISON:  Chest x-ray from same day. FINDINGS: Unchanged endotracheal and enteric tubes. Unchanged bilateral chest tubes and mediastinal drain. Unchanged right internal jugular Swan-Ganz catheter with tip in the main pulmonary artery. Unchanged right upper extremity PICC line and bilateral internal jugular central venous catheters. Unchanged ECMO catheters. Normal heart size. Prior AVR and left atrial appendage clipping. Hazy airspace disease both lungs, greater on the  left, similar to prior study. No pleural effusion or pneumothorax. No acute osseous abnormality. IMPRESSION: 1. Stable support tubes and lines.  No pneumothorax. 2. Unchanged diffuse pulmonary edema. Electronically Signed   By: Titus Dubin M.D.   On: 06/24/2019 14:01   Dg Chest Port 1 View  Result Date: 05/31/2019 CLINICAL DATA:  Status post open heart surgery. EXAM: PORTABLE CHEST 1 VIEW COMPARISON:  Same day. FINDINGS: Stable position of endotracheal, nasogastric tubes, bilateral jugular catheters and right-sided PICC line. Stable bilateral chest tubes are noted without pneumothorax. Stable bilateral lung opacities are noted. Bony thorax is unremarkable. IMPRESSION: Stable support apparatus. Stable bilateral lung opacities. Stable bilateral chest tubes without pneumothorax. Electronically Signed   By: Marijo Conception M.D.   On: 06/23/2019 13:12   Dg Chest Port 1 View  Result Date: 06/02/2019 CLINICAL DATA:  Endotracheal tube. EXAM: PORTABLE CHEST 1 VIEW COMPARISON:   June 05, 2019. FINDINGS: Endotracheal and nasogastric tubes are unchanged in position. Stable bilateral jugular catheters are noted. Right internal jugular catheter is noted with distal tip in expected position of main pulmonary artery. Bilateral chest tubes are noted without pneumothorax. Stable feeding tube is seen entering stomach. Slightly improved bilateral lung opacities are noted suggesting improving pulmonary edema. Bony thorax is unremarkable. IMPRESSION: Stable support apparatus. Stable bilateral chest tubes without pneumothorax. Improved bilateral lung opacities are noted. Electronically Signed   By: Marijo Conception M.D.   On: 06/20/2019 07:10   Dg Chest Port 1 View  Result Date: 06/18/2019 CLINICAL DATA:  Status post CABG. EXAM: PORTABLE CHEST 1 VIEW COMPARISON:  Earlier same day FINDINGS: 1804 hours. Endotracheal tube tip is 3.6 cm above the base of the carina. The NG tube tip is in the proximal stomach. Feeding catheter is transpyloric although the distal tip has not been included on the film. Right IJ pulmonary artery catheter tip is in the main pulmonary artery. A second right IJ line is probably in the SVC but is partially obscured. A right PICC line tip overlies the mid SVC. Left IJ central line tip projects over the central mediastinum, over the expected course of the innominate vein. This line has been pulled back in the interval since the prior study. ECMO device seen on the previous study is no longer evident. Patient is status post aortic valve replacement. Left atrial appendage occluded device noted. 2 left chest tubes evident. Lucency at the midline is compatible with pneumomediastinum and may be in part related to the median sternotomy. Bilateral diffuse airspace opacity is consistent with pulmonary edema. No substantial pleural effusion. IMPRESSION: 1. Support apparatus as above. 2. Diffuse bilateral airspace disease compatible with pulmonary edema. . Electronically Signed   By:  Misty Stanley M.D.   On: 05/31/2019 18:37     Medications:     Scheduled Medications: . sodium chloride   Intravenous Once  . acetaminophen  1,000 mg Oral Q6H   Or  . acetaminophen (TYLENOL) oral liquid 160 mg/5 mL  1,000 mg Per Tube Q6H  . arformoterol  15 mcg Nebulization BID  . atorvastatin  40 mg Per Tube q1800  . B-complex with vitamin C  1 tablet Per Tube Daily  . bisacodyl  10 mg Oral Daily   Or  . bisacodyl  10 mg Rectal Daily  . chlorhexidine gluconate (MEDLINE KIT)  15 mL Mouth Rinse BID  . Chlorhexidine Gluconate Cloth  6 each Topical Daily  . docusate  200 mg Per Tube Daily  . feeding supplement (  PRO-STAT SUGAR FREE 64)  30 mL Per Tube QID  . guaiFENesin  30 mL Per Tube BID  . mouth rinse  15 mL Mouth Rinse 10 times per day  . mupirocin ointment   Nasal BID  . polyvinyl alcohol  1 drop Both Eyes QID  . sodium chloride flush  3 mL Intravenous Q12H  . umeclidinium bromide  2 puff Inhalation Daily  . vancomycin variable dose per unstable renal function (pharmacist dosing)   Does not apply See admin instructions    Infusions: .  prismasol BGK 4/2.5 400 mL/hr at 06/11/2019 1602  .  prismasol BGK 4/2.5 300 mL/hr at 06/23/2019 1602  . sodium chloride    . sodium chloride    . sodium chloride    . dexmedetomidine (PRECEDEX) IV infusion 0.7 mcg/kg/hr (06/07/19 0600)  . DOPamine 2.5 mcg/kg/min (06/04/2019 2000)  . EPINEPHrine 4 mg in dextrose 5% 250 mL infusion (16 mcg/mL) Stopped (06/15/2019 2141)  . feeding supplement (VITAL 1.5 CAL)    . fentaNYL infusion INTRAVENOUS 200 mcg/hr (06/07/19 0600)  . fluconazole (DIFLUCAN) IV    . heparin Stopped (05/30/2019 1331)  . insulin    . lactated ringers 20 mL/hr at 06/22/2019 2331  . lactated ringers 20 mL/hr at 06/07/19 0600  . meropenem (MERREM) IV    . midazolam 4 mg/hr (06/07/19 0600)  . milrinone Stopped (06/17/2019 1813)  . norepinephrine (LEVOPHED) Adult infusion 20 mcg/min (06/18/2019 1149)  . pantoprozole (PROTONIX) infusion 8  mg/hr (06/07/19 0005)  . phenylephrine (NEO-SYNEPHRINE) Adult infusion Stopped (05/30/2019 1734)  . prismasol BGK 4/2.5 1,800 mL/hr at 05/28/2019 1602  . vasopressin (PITRESSIN) infusion - *FOR SHOCK* Stopped (06/07/19 0503)    PRN Medications: sodium chloride, docusate, heparin, influenza vaccine adjuvanted, metoprolol tartrate, midazolam, morphine injection, ondansetron (ZOFRAN) IV, oxyCODONE, pneumococcal 23 valent vaccine, sodium chloride, sodium chloride flush, traMADol   Assessment/Plan:   1. Shock: Possible mixed cardiogenic/septic with fever and suspected PNA.  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  Echo with EF 20%, RV moderately HK.  Impella CP placed 10/23. Switched for Impella 5.0 on 10/24. No flow meter on Impella due to damage to sensor on insertion. Patient has LBBB.  PEA arrest 11/3, ECMO started 11/4. ECMO circuit changed due to high dP and rising LDH on 11/7.  On 11/9 with ongoing rise in LDH, Impella removed and LV vent placed.  11/11 ECMO decannulated and CABG-bioprosthetic AVR done, chest left open. With markedly low CO, ECMO re-initiated 11/12. This morning, he is off pressors with CVP 10 and MAP 70s.  Lactate 1.7. Good ECMO output.  CXR with pulmonary edema.  - Will get echo.  - Restart CVVH, pull fluid if BP allows.    2. CAD:  NSTEMI, hs-TnI 16,000. 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 Now s/p CABG x 4 on 11/11.  - continue ASA/statin. No b-blocker with shock 3. AKI: Baseline creatinine 1.3, now AKI likely due to ATN from shock and contrast (had CTA for PE at admission, only 25 cc contrast with cath).   - Restart CVVH. Management as above 4. Acute Respiratory Failure: Has had PNA with Enterococcus faecalis in BAL cultures. Now re-intubated.  He had PEA arrest with possible aspiration 11/3 and also had pulmonary hemorrhage.  Hemoptysis appears to have stopped. CXR still with diffuse infiltrates but improved.  Hopefully mostly pulmonary edema but ?ARDS component.  - CVVH management  as above. - Continue vanco/meropenem/fluconazole per CCM.  5. ID: Suspected PNA, CXR with diffuse infiltrates. Enterococcus faecalis in BAL cultures initially, now with Candida albicans. Afebrile.  - Continue meropenem, vancomycin, fluconazole.  6. LBBB: Unsure chronicity.  7. Anemia: Multiple products overnight, hgb 9.8 this morning.   8. Thrombocytopenia: HIT negative initially.  ?Low due to sepsis versus hemolysis. Platelets down to 20K.  Pending HIT but ?due to hemolysis/inflammation.  - Discussed with team, will hold off on anticoagulation (bivalirudin) for now with plts 20K and INR 3.7. Reassess in pm.  9. PVCs/NSVT: Quiescent.  10. FEN: Cortrak, will need post-pyloric TFs.  11. PAF with post-termination pauses: a-paced today.  - He is no longer on amiodarone.  12. Elevated LFTs: Shock liver likely from period of low cardiac output prior to ECMO recannulation yesterday. Check LFTs daily.   Patient remains critically ill and tenuous.   CRITICAL CARE Performed by: Loralie Champagne  Total critical care time: 40 minutes  Critical care time was exclusive of separately billable procedures and treating other patients.  Critical care was necessary to treat or prevent imminent or life-threatening deterioration.  Critical care was time spent personally by me on the following activities: development of treatment plan with patient and/or surrogate as well as nursing, discussions with consultants, evaluation of patient's response to treatment, examination of patient, obtaining history from patient or surrogate, ordering and performing treatments and interventions, ordering and review of laboratory studies, ordering and review of radiographic studies, pulse oximetry and re-evaluation of patient's condition.  Loralie Champagne 06/07/2019 7:31 AM

## 2019-06-07 NOTE — Anesthesia Postprocedure Evaluation (Signed)
Anesthesia Post Note  Patient: Raymond Chavez  Procedure(s) Performed: CORONARY ARTERY BYPASS GRAFTING (CABG) x 4, ON PUMP, USING LEFT INTERNAL MAMMARY ARTERY AND RIGHT GREATER SAPHENOUS VEIN HARVESTED ENDOSCOPICALLY (N/A Chest) AORTIC VALVE REPLACEMENT (AVR) USING EDWARDS INTUITY 21MM AORTIC VALVE (N/A ) TRANSESOPHAGEAL ECHOCARDIOGRAM (TEE) (N/A ) Clipping Of Atrial Appendage USING 35MM ATRIAL CLIP Video Bronchoscopy REMOVAL OF  Ecmo (Extracorporeal Membrane Oxygenation)     Patient location during evaluation: SICU Anesthesia Type: General Level of consciousness: sedated Pain management: pain level controlled Vital Signs Assessment: post-procedure vital signs reviewed and stable Respiratory status: patient remains intubated per anesthesia plan Cardiovascular status: stable Postop Assessment: no apparent nausea or vomiting Anesthetic complications: no    Last Vitals:  Vitals:   06/07/19 1030 06/07/19 1045  BP:    Pulse:    Resp: (!) 0 (!) 0  Temp:    SpO2: 98% 97%    Last Pain:  Vitals:   06/07/19 0730  TempSrc: Core  PainSc:                  Raymond Chavez

## 2019-06-07 NOTE — Progress Notes (Signed)
[facility] Gastroenterology Progress Note  Tatum Massman 68 y.o. [birth year]  CC: GI bleed   Subjective: Seen and examined at bedside.  Patient's son was at bedside.  Discussed with RN.  Continues to have low platelet count and low hemoglobin.  ROS : Not able to obtain   Objective: Vital signs in last 24 hours: Vitals:   06/07/19 1145 06/07/19 1200  BP:    Pulse:    Resp: (!) 0 (!) 0  Temp:  97.9 F (36.6 C)  SpO2: 100% 100%    Physical Exam: No significant change compared to yesterday  Lab Results: Recent Labs    05/31/2019 1640 06/07/2019 1745  06/07/19 0421 06/07/19 0546 06/07/19 1108  NA 145  --    < > 149* 146* 145  K 5.0  --    < > 4.4 4.3 4.4  CL 106  --   --  109  --   --   CO2 26  --   --  25  --   --   GLUCOSE 116*  --   --  94  --   --   BUN 48*  --   --  65*  --   --   CREATININE 1.67*  --   --  1.96*  --   --   CALCIUM 8.2*  --   --  7.9*  --   --   MG 2.9*  --   --  2.8*  --   --   PHOS  --  7.1*  --  6.1*  --   --    < > = values in this interval not displayed.   Recent Labs    06/22/2019 1640 06/07/19 0421  AST 1,428* 1,296*  ALT 454* 606*  ALKPHOS 62 57  BILITOT 13.9* 20.1*  PROT 3.5* 3.5*  ALBUMIN 2.2* 2.3*   Recent Labs    06/17/2019 1811  05/27/2019 1640  06/07/19 0421 06/07/19 0546 06/07/19 1108  WBC 17.6*   < > 7.7  --  7.9  --   --   NEUTROABS 15.1*  --   --   --   --   --   --   HGB 13.6   < > 12.2*   < > 9.8* 8.2* 8.5*  HCT 40.5   < > 34.8*   < > 27.9* 24.0* 25.0*  MCV 88.8   < > 84.7  --  83.5  --   --   PLT 79*   < > 43*  --  20*  --   --    < > = values in this interval not displayed.   Recent Labs    06/18/2019 1640 06/07/19 0421  LABPROT 31.8* 36.1*  INR 3.1* 3.7*      Assessment/Plan: -GI bleed with dark liquid stool in the rectal tube.  Could be diffuse mucosal bleeding with significantly low platelet counts. -Cardiogenic and septic shock. -Abnormal LFTs with coagulopathy.  Could be from shock liver -Cardiac arrest.   Currently on ECMO, s/p Impella placement. -LV thrombus.  Started on bivalrudin by cardiology  Recommendations -------------------------- -Continue supportive care for now.  -Continue Protonix drip  -Unfortunately, not able to EGD at this time because of significantly low platelet counts and his bleeding could be diffuse mucosal bleeding from anywhere in the GI tract.  -Agree with hematology consult.  -We will hold off on starting vitamin K because of LV thrombus.  -Recommend to check CBC every 6 hours.  -  Unfortunately, very difficult situation.  Discussed with patient's son at bedside.   Kathi Der MD, FACP 06/07/2019, 12:40 PM  Contact #  [phone]

## 2019-06-07 NOTE — Progress Notes (Signed)
1 Day Post-Op Procedure(s) (LRB): EXPLORATION OF CHEST (N/A) TRANSESOPHAGEAL ECHOCARDIOGRAM (TEE) (N/A) Cannulation For Ecmo (Extracorporeal Membrane Oxygenation) (N/A) Indocyanine Green Fluorescence Imaging (Icg) (N/A) Subjective: Unable to assess; family states he has been grimacing as suction or dressing changes were performed  Objective: Vital signs in last 24 hours: Temp:  [98.1 F (36.7 C)-98.4 F (36.9 C)] 98.4 F (36.9 C) (11/13 0730) Pulse Rate:  [90] 90 (11/13 0730) Cardiac Rhythm: A-V Sequential paced (11/13 0730) Resp:  [0-25] 0 (11/13 1100) BP: (0)/(0) 0/0 (11/12 2208) SpO2:  [97 %-100 %] 97 % (11/13 1045) Arterial Line BP: (0-231)/(0-108) 79/66 (11/13 0530) FiO2 (%):  [30 %] 30 % (11/13 0811) Weight:  [88.8 kg] 88.8 kg (11/13 0530)  Hemodynamic parameters for last 24 hours: PAP: (7-11)/(5-8) 11/8 CVP:  [6 mmHg-18 mmHg] 12 mmHg  Intake/Output from previous day: 11/12 0701 - 11/13 0700 In: 6486.1 [I.V.:3658; Blood:2068; NG/GT:115; IV Piggyback:600.1] Out: 1300 [Stool:300; Chest Tube:780] Intake/Output this shift: Total I/O In: 336.3 [I.V.:236.3; IV Piggyback:100.1] Out: 257 [Other:147; Stool:50; Chest Tube:60]  General appearance: sedated Neurologic: unable to assess Heart: regular rate and rhythm, S1, S2 normal, no murmur, click, rub or gallop Lungs: diminished breath sounds bilaterally Extremities: 2+ edema; warm Wound: covered, dry  Lab Results: Recent Labs    05/27/2019 1640  06/07/19 0421 06/07/19 0546  WBC 7.7  --  7.9  --   HGB 12.2*   < > 9.8* 8.2*  HCT 34.8*   < > 27.9* 24.0*  PLT 43*  --  20*  --    < > = values in this interval not displayed.   BMET:  Recent Labs    05/27/2019 1640  06/07/19 0421 06/07/19 0546  NA 145   < > 149* 146*  K 5.0   < > 4.4 4.3  CL 106  --  109  --   CO2 26  --  25  --   GLUCOSE 116*  --  94  --   BUN 48*  --  65*  --   CREATININE 1.67*  --  1.96*  --   CALCIUM 8.2*  --  7.9*  --    < > = values in this  interval not displayed.    PT/INR:  Recent Labs    06/07/19 0421  LABPROT 36.1*  INR 3.7*   ABG    Component Value Date/Time   PHART 7.533 (H) 06/07/2019 0546   HCO3 32.5 (H) 06/07/2019 0546   TCO2 34 (H) 06/07/2019 0546   ACIDBASEDEF 2.0 06/23/2019 1116   O2SAT 100.0 06/07/2019 0546   CBG (last 3)  Recent Labs    06/18/2019 1404 06/11/2019 2000 06/22/2019 2341  GLUCAP 89 91 92    Assessment/Plan: S/P Procedure(s) (LRB): EXPLORATION OF CHEST (N/A) TRANSESOPHAGEAL ECHOCARDIOGRAM (TEE) (N/A) Cannulation For Ecmo (Extracorporeal Membrane Oxygenation) (N/A) Indocyanine Green Fluorescence Imaging (Icg) (N/A) dialysis  Anticoagulation with bivalrudin due to persistent, profound thrombocytopenia Consult hematology Continue ECMO support   LOS: 22 days    Raymond Chavez 06/07/2019

## 2019-06-07 NOTE — Progress Notes (Signed)
PCCM Progress Note Seen in f/u for respiratory failure in setting of profound cardiac shock on [facility] ECMO.  Off CRRT at present  S: labs/events reviewed.  Remains heavily sedated, flows okay at present. 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 CRRT diasylate  primarily to target pH 7.3-7.4 rather than adjusting vent -Given issues with recruitment, would not reduce PEEP below 10 -Remainder of care per primary  32 minutes CC time Erskine Emery MD PCCM

## 2019-06-07 NOTE — Consult Note (Addendum)
[place]  [phone] [phone]    INITIAL HEMATOLOGY CONSULTATION  Referring MD:  Dr. Fredrich Romans  Reason for Referral: Thrombocytopenia  HPI: Mr. Carino is a 68 year old male with a past medical history significant for hypertension and CAD.  The patient presented to the emergency room on 04/30/2019 with shortness of breath.  He was admitted with acute hypoxic respiratory failure secondary to acute pulmonary edema, pneumonia, and possible ACS/NSTEMI.  He required intubation in the emergency room after failing BiPAP.  He underwent left heart catheterization on 05/07/2019 and was found to have severe CAD and cardiogenic shock.  He has developed possible aspiration.  Developed PEA and required CPR on 06/22/2019.  ECMO initiated on 06/12/2019.  Underwent CABG x4 would bioprosthetic aortic valve on 06/11/2019.  The patient has developed progressive thrombocytopenia during his hospitalization.  Baseline platelets on admission were 200,000. They were down to 10,000 on 06/01/2019.  Currently at 20,000 this morning.  He has received multiple transfusions including 27 units of PRBCs and 18 units of platelets.  He received his last platelet transfusion on the evening of 06/09/2019.  He has also received 4 units of cryoprecipitate and 9 units of FFP.  The patient has been receiving heparin during his hospitalization.  It appears that it was started on 05/19/2019 on admission and was discontinued on the morning of 06/11/2019 due to concern for HIT.  He has been off heparin for at least 24 hours now.  Due to the formation of an LV clot on 06/07/2019, he was started on anticoagulation with bivalrudin.  Baseline PTT this morning was 46.  INR this morning was 3.7.  When seen today, no family is at the bedside.  Nursing and perfusionist in the room.  The patient is sedated, on ventilator and ECMO.  Resumed CRRT today.  Nursing has noted some bloody stools.  Report that when he  was on heparin, he bled very easily.  However, he has had issues with ongoing clotting.  Unable to obtain a comprehensive review of systems secondary to patient sedation.  Hematology was asked see the patient to make recommendations regarding his thrombocytopenia.   Past Medical History:  Diagnosis Date  . Acute respiratory failure with hypoxemia ([place]) 04/2019  . Carotid artery occlusion   . Hypertension   :    Past Surgical History:  Procedure Laterality Date  . CANNULATION FOR ECMO (EXTRACORPOREAL MEMBRANE OXYGENATION) N/A 06/11/2019   Procedure: CANNULATION FOR ECMO (EXTRACORPOREAL MEMBRANE OXYGENATION);  Surgeon: Wonda Olds, MD;  Location: [place];  Service: Open Heart Surgery;  Laterality: N/A;  PARTIAL STERNOTOMY  . CANNULATION FOR ECMO (EXTRACORPOREAL MEMBRANE OXYGENATION)  06/15/2019   Procedure: INSERTION OF LEFT VENTRICLE VENT FOR ECMO;  Surgeon: Wonda Olds, MD;  Location: [facility];  Service: Open Heart Surgery;;  . CHEST TUBE INSERTION  06/09/2019   Procedure: Chest Tube Insertion;  Surgeon: Wonda Olds, MD;  Location: [facility];  Service: Open Heart Surgery;;  . GROIN DISSECTION  05/19/2019   Procedure: right femoral artery repair and left heart catheterization;  Surgeon: Wonda Olds, MD;  Location: [place];  Service: Open Heart Surgery;;  . INSERTION OF IMPLANTABLE LEFT VENTRICULAR ASSIST DEVICE N/A 04/30/2019   Procedure: INSERTION OF IMPELLA 5.0 IMPLANTABLE LEFT VENTRICULAR ASSIST DEVICE THROUGH RIGHT AXILLARY; REMOVAL OF RIGHT GROIN IMPELLA CP;  Surgeon: Wonda Olds, MD;  Location: [place];  Service: Open Heart Surgery;  Laterality: N/A;  . REMOVAL OF IMPELLA LEFT  VENTRICULAR ASSIST DEVICE N/A 06/04/2019   Procedure: REMOVAL OF IMPELLA LEFT VENTRICULAR ASSIST DEVICE;  Surgeon: Wonda Olds, MD;  Location: [place];  Service: Open Heart Surgery;  Laterality: N/A;  . RIGHT/LEFT HEART CATH AND CORONARY ANGIOGRAPHY N/A 05/19/2019   Procedure: RIGHT/LEFT HEART  CATH AND CORONARY ANGIOGRAPHY;  Surgeon: Larey Dresser, MD;  Location: [place] [facility];  Service: Cardiovascular;  Laterality: N/A;  . TEE WITHOUT CARDIOVERSION N/A 06/04/2019   Procedure: TRANSESOPHAGEAL ECHOCARDIOGRAM (TEE);  Surgeon: Wonda Olds, MD;  Location: [place];  Service: Open Heart Surgery;  Laterality: N/A;  . TEE WITHOUT CARDIOVERSION N/A 06/22/2019   Procedure: TRANSESOPHAGEAL ECHOCARDIOGRAM (TEE);  Surgeon: Wonda Olds, MD;  Location: [place];  Service: Open Heart Surgery;  Laterality: N/A;  . VENTRICULAR ASSIST DEVICE INSERTION N/A 04/27/2019   Procedure: VENTRICULAR ASSIST DEVICE INSERTION;  Surgeon: Larey Dresser, MD;  Location: [place] [facility];  Service: Cardiovascular;  Laterality: N/A;  :   CURRENT MEDS: Current Facility-Administered Medications  Medication Dose Route Frequency Provider Last Rate Last Dose  .  prismasol BGK 4/2.5 infusion   CRRT Continuous Donato Heinz, MD 400 mL/hr at 06/01/2019 1602    .  prismasol BGK 4/2.5 infusion   CRRT Continuous Donato Heinz, MD 300 mL/hr at 05/28/2019 1602    . 0.45 % sodium chloride infusion   Intravenous Continuous PRN Lars Pinks M, PA-C      . 0.9 %  sodium chloride infusion (Manually program via Guardrails IV Fluids)   Intravenous Once Bowser, Laurel Dimmer, NP      . 0.9 %  sodium chloride infusion  250 mL Intravenous Continuous Zimmerman, Donielle M, PA-C      . 0.9 %  sodium chloride infusion   Intravenous Continuous Zimmerman, Donielle M, PA-C      . acetaminophen (TYLENOL) tablet 1,000 mg  1,000 mg Oral Q6H Zimmerman, Donielle M, PA-C       Or  . acetaminophen (TYLENOL) 160 MG/5ML solution 1,000 mg  1,000 mg Per Tube Q6H Lars Pinks M, PA-C   1,000 mg at 05/26/2019 0009  . arformoterol (BROVANA) nebulizer solution 15 mcg  15 mcg Nebulization BID Lars Pinks M, PA-C   15 mcg at 06/07/19 0730  . atorvastatin (LIPITOR) tablet 40 mg  40 mg Per Tube q1800 Einar Grad, RPH       . B-complex with vitamin C tablet 1 tablet  1 tablet Per Tube Daily Lars Pinks M, PA-C   1 tablet at 06/04/19 1100  . bisacodyl (DULCOLAX) EC tablet 10 mg  10 mg Oral Daily Lars Pinks M, PA-C       Or  . bisacodyl (DULCOLAX) suppository 10 mg  10 mg Rectal Daily Lars Pinks M, PA-C      . bivalirudin (ANGIOMAX) 250 mg in sodium chloride 0.9 % 500 mL (0.5 mg/mL) infusion  0.03 mg/kg/hr Intravenous Continuous Einar Grad, RPH 5.3 mL/hr at 06/07/19 1016 0.03 mg/kg/hr at 06/07/19 1016  . chlorhexidine gluconate (MEDLINE KIT) (PERIDEX) 0.12 % solution 15 mL  15 mL Mouth Rinse BID Lars Pinks M, PA-C   15 mL at 06/07/19 0816  . Chlorhexidine Gluconate Cloth 2 % PADS 6 each  6 each Topical Daily Nani Skillern, PA-C   6 each at 05/26/2019 2440  . dexmedetomidine (PRECEDEX) 400 MCG/100ML (4 mcg/mL) infusion  0-0.7 mcg/kg/hr Intravenous Continuous Lars Pinks M, PA-C 12.65 mL/hr at 06/07/19 1000 0.7 mcg/kg/hr at 06/07/19 1000  . docusate (  COLACE) 50 MG/5ML liquid 100 mg  100 mg Per Tube BID PRN Nani Skillern, PA-C   100 mg at 05/26/19 0955  . docusate (COLACE) 50 MG/5ML liquid 200 mg  200 mg Per Tube Daily Einar Grad, RPH      . DOPamine (INTROPIN) 800 mg in dextrose 5 % 250 mL (3.2 mg/mL) infusion  2.5 mcg/kg/min Intravenous Titrated Wonda Olds, MD 3.47 mL/hr at 05/27/2019 2000 2.5 mcg/kg/min at 06/21/2019 2000  . EPINEPHrine (ADRENALIN) 4 mg in dextrose 5 % 250 mL (0.016 mg/mL) infusion  0-10 mcg/min Intravenous Titrated Nani Skillern, PA-C   Stopped at 06/04/2019 2141  . feeding supplement (PRO-STAT SUGAR FREE 64) liquid 30 mL  30 mL Per Tube QID Lars Pinks M, PA-C   30 mL at 05/31/2019 2322  . feeding supplement (VITAL 1.5 CAL) liquid 1,000 mL  1,000 mL Per Tube Continuous Lars Pinks M, PA-C      . fentaNYL 2545mg in NS 2534m(1011mml) infusion-PREMIX  0-400 mcg/hr Intravenous Continuous AtkWonda OldsD  20 mL/hr at 06/07/19 1000 200 mcg/hr at 06/07/19 1000  . fluconazole (DIFLUCAN) IVPB 400 mg  400 mg Intravenous Q24H ZimLars Pinks PA-C      . guaiFENesin (ROBITUSSIN) 100 MG/5ML solution 600 mg  30 mL Per Tube BID ZimLars Pinks PA-C   600 mg at 06/13/2019 2321  . influenza vaccine adjuvanted (FLUAD) injection 0.5 mL  0.5 mL Intramuscular Prior to discharge Mannam, Praveen, MD      . insulin regular, human (MYXREDLIN) 100 units/ 100 mL infusion   Intravenous Continuous BitEinar GradPH      . lactated ringers infusion   Intravenous Continuous ZimNani SkillernA-C 20 mL/hr at 06/18/2019 2331    . lactated ringers infusion   Intravenous Continuous ZimNani SkillernA-C 20 mL/hr at 06/07/19 1000    . MEDLINE mouth rinse  15 mL Mouth Rinse 10 times per day ZimNani SkillernA-C   15 mL at 06/07/19 0534  . meropenem (MERREM) 1 g in sodium chloride 0.9 % 100 mL IVPB  1 g Intravenous Q8H BitEinar GradPHOverlake Ambulatory Surgery Center LLCStopped at 06/07/19 [id number] metoprolol tartrate (LOPRESSOR) injection 2.5-5 mg  2.5-5 mg Intravenous Q2H PRN ZimLars Pinks PA-C      . midazolam (VERSED) 50 mg/50 mL (1 mg/mL) premix infusion  0-10 mg/hr Intravenous Continuous AtkWonda OldsD 4 mL/hr at 06/07/19 1000 4 mg/hr at 06/07/19 1000  . midazolam (VERSED) injection 2 mg  2 mg Intravenous Q1H PRN ZimNani SkillernA-C   2 mg at 06/01/19 1712  . milrinone (PRIMACOR) 20 MG/100 ML (0.2 mg/mL) infusion  0.125 mcg/kg/min Intravenous Continuous AtkWonda OldsD   Stopped at 06/02/2019 1813  . morphine 2 MG/ML injection 1-4 mg  1-4 mg Intravenous Q1H PRN ZimLars Pinks PA-C      . mupirocin ointment (BACTROBAN) 2 %   Nasal BID ZimLars Pinks PA-C      . norepinephrine (LEVOPHED) 16 mg in 250m11memix infusion  0-40 mcg/min Intravenous Titrated ZimmLars PinksPA-C 18.75 mL/hr at 06/09/2019 1149 20 mcg/min at 06/21/2019 1149  . ondansetron (ZOFRAN) injection 4 mg  4  mg Intravenous Q6H PRN ZimmLars PinksPA-C      . oxyCODONE (Oxy IR/ROXICODONE) immediate release tablet 5-10 mg  5-10 mg Oral Q3H PRN ZimmLars PinksPA-C      . pantoprazole (PROTONIX) 80  mg in sodium chloride 0.9 % 250 mL (0.32 mg/mL) infusion  8 mg/hr Intravenous Continuous Brahmbhatt, Parag, MD 25 mL/hr at 06/07/19 0005 8 mg/hr at 06/07/19 0005  . phenylephrine (NEOSYNEPHRINE) 20-0.9 MG/250ML-% infusion  0-100 mcg/min Intravenous Titrated Nani Skillern, PA-C   Stopped at 06/04/2019 1734  . pneumococcal 23 valent vaccine (PNU-IMMUNE) injection 0.5 mL  0.5 mL Intramuscular Prior to discharge Mannam, Praveen, MD      . polyvinyl alcohol (LIQUIFILM TEARS) 1.4 % ophthalmic solution 1 drop  1 drop Both Eyes QID Lars Pinks M, PA-C   1 drop at 06/07/19 0932  . prismasol BGK 4/2.5 infusion   CRRT Continuous Donato Heinz, MD 1,800 mL/hr at 06/12/2019 1602    . sodium chloride 0.9 % primer fluid for CRRT   CRRT PRN Donato Heinz, MD      . sodium chloride flush (NS) 0.9 % injection 3 mL  3 mL Intravenous Q12H Lars Pinks M, PA-C   3 mL at 06/11/2019 2154  . sodium chloride flush (NS) 0.9 % injection 3 mL  3 mL Intravenous PRN Lars Pinks M, PA-C      . traMADol Veatrice Bourbon) tablet 50 mg  50 mg Oral Q4H PRN Lars Pinks M, PA-C      . umeclidinium bromide (INCRUSE ELLIPTA) 62.5 MCG/INH 2 puff  2 puff Inhalation Daily Lars Pinks M, PA-C      . vancomycin variable dose per unstable renal function (pharmacist dosing)   Does not apply See admin instructions Einar Grad, [facility]      . vasopressin (PITRESSIN) 40 Units in sodium chloride 0.9 % 250 mL (0.16 Units/mL) infusion  0.04 Units/min Intravenous Continuous Wonda Olds, MD   Stopped at 06/07/19 0503      No Known Allergies:  History reviewed. No pertinent family history.:  Social History   Socioeconomic History  . Marital status: Divorced    Spouse name: Not on file  .  Number of children: 2  . Years of education: Not on file  . Highest education level: Not on file  Occupational History  . Not on file  Social Needs  . Financial resource strain: Not on file  . Food insecurity    Worry: Not on file    Inability: Not on file  . Transportation needs    Medical: Not on file    Non-medical: Not on file  Tobacco Use  . Smoking status: Light Tobacco Smoker    Packs/day: 0.00    Years: 40.00    Pack years: 0.00    Types: Cigarettes    Last attempt to quit: 10/23/2017    Years since quitting: 1.6  . Smokeless tobacco: Never Used  Substance and Sexual Activity  . Alcohol use: Yes    Alcohol/week: 6.0 - 12.0 standard drinks    Types: 6 - 12 Cans of beer per week  . Drug use: No  . Sexual activity: Yes  Lifestyle  . Physical activity    Days per week: Not on file    Minutes per session: Not on file  . Stress: Not on file  Relationships  . Social Herbalist on phone: Not on file    Gets together: Not on file    Attends religious service: Not on file    Active member of club or organization: Not on file    Attends meetings of clubs or organizations: Not on file    Relationship status: Not on file  .  Intimate partner violence    Fear of current or ex partner: Not on file    Emotionally abused: Not on file    Physically abused: Not on file    Forced sexual activity: Not on file  Other Topics Concern  . Not on file  Social History Narrative  . Not on file  :  REVIEW OF SYSTEMS: Unable to obtain secondary to patient condition.  Exam: Patient Vitals for the past 24 hrs:  BP Temp Temp src Pulse Resp SpO2 Weight  06/07/19 1000 - - - - (!) 0 98 % -  06/07/19 0945 - - - - (!) 0 - -  06/07/19 0930 - - - - (!) 0 - -  06/07/19 0915 - - - - (!) 0 99 % -  06/07/19 0900 - - - - (!) 8 - -  06/07/19 0811 - - - - - 99 % -  06/07/19 0733 - - - - - 99 % -  06/07/19 0730 - 98.4 F (36.9 C) Core 90 (!) 0 - -  06/07/19 0715 - - - - (!) 0 - -   06/07/19 0700 - - - - (!) 0 - -  06/07/19 0630 - - - - (!) 0 - -  06/07/19 0600 - - - - (!) 0 - -  06/07/19 0530 - - - - (!) 0 99 % 195 lb 12.3 oz (88.8 kg)  06/07/19 0500 - - - - (!) 0 97 % -  06/07/19 0430 - - - - (!) 0 - -  06/07/19 0427 - - - - - 99 % -  06/07/19 0400 - 98.4 F (36.9 C) Core - (!) 0 - -  06/07/19 0330 - - - - (!) 0 - -  06/07/19 0300 - - - - (!) 0 - -  06/07/19 0230 - - - - (!) 0 99 % -  06/07/19 0200 - - - - (!) 0 100 % -  06/07/19 0130 - - - - (!) 0 - -  06/07/19 0100 - - - - (!) 0 - -  06/07/19 0030 - - - - (!) 0 - -  06/07/19 0000 - 98.4 F (36.9 C) Core - (!) 0 99 % -  06/04/2019 2330 - - - - (!) 0 99 % -  06/15/2019 2300 - - - - (!) 0 99 % -  06/15/2019 2230 - - - - (!) 25 99 % -  06/16/2019 2208 (!) 0/0 98.2 F (36.8 C) Core - (!) 25 98 % -  06/13/2019 2200 - - - - (!) 25 98 % -  05/28/2019 2130 (!) 0/0 98.4 F (36.9 C) Core 90 (!) 25 97 % -  06/24/2019 2115 - 98.4 F (36.9 C) Core - (!) 25 97 % -  06/02/2019 2105 - - - 90 - 97 % -  06/07/2019 2100 - - - - (!) 25 98 % -  06/10/2019 2030 - - - - (!) 25 98 % -  05/31/2019 2000 - 98.4 F (36.9 C) Core - (!) 25 98 % -  06/15/2019 1930 - - - - (!) 25 99 % -  06/21/2019 1715 - - - - (!) 25 - -  06/01/2019 1700 - - - - (!) 0 - -  05/30/2019 1645 - - - - 18 - -  06/04/2019 1630 - - - - 20 - -  05/31/2019 1615 - - - - 20 - -  06/10/2019 1600 - - - - Marland Kitchen)  25 - -  06/15/2019 1545 - - - - (!) 25 - -  06/18/2019 1530 - - - - (!) 25 - -  06/02/2019 1515 - - - - (!) 25 - -  06/11/2019 1500 - - - - (!) 22 - -  06/22/2019 1445 - - - - (!) 9 - -  06/02/2019 1442 - - - - - 98 % -  05/28/2019 1441 - - - 90 (!) 25 98 % -  06/14/2019 1430 - - - - 20 - -  06/01/2019 1415 - - - - 18 - -  06/13/2019 1400 - - - - (!) 0 - -  05/28/2019 1345 - - - - 13 - -  06/01/2019 1330 - - - - (!) 25 - -  06/20/2019 1315 - - - - (!) 22 99 % -  06/22/2019 1300 - - - - (!) 0 - -  06/13/2019 1245 - - - - (!) 25 - -  06/01/2019 1230 - - - - (!) 25 98 % -  06/14/2019 1215 - 98.1 F (36.7 C) Core  - (!) 25 - -  06/22/2019 1202 - - - - - 100 % -  05/31/2019 1201 - - - 90 (!) 25 100 % -  06/22/2019 1200 - - - - (!) 25 - -    General: Sedated, chronically ill Eyes: Scleral icterus noted  ENT: ET tube in place Neck: Trachea midline   Respiratory: Coarse rhonchi in the anterior lung fields  Cardiovascular: Unable to auscultate heart sounds secondary to overlying hardware, anasarca noted in all 4 extremities.   GI: Hypoactive bowel sounds, soft Skin: Jaundiced, multiple drains/lines in place, multiple ecchymoses on his extremities Neuro exam: Heavily sedated  LABS:  Lab Results  Component Value Date   WBC 7.9 06/07/2019   HGB 8.2 (L) 06/07/2019   HCT 24.0 (L) 06/07/2019   PLT 20 (LL) 06/07/2019   GLUCOSE 94 06/07/2019   CHOL 115 05/06/2019   TRIG 68 04/27/2019   HDL 30 (L) 04/28/2019   LDLCALC 66 05/10/2019   ALT 606 (H) 06/07/2019   AST 1,296 (H) 06/07/2019   NA 146 (H) 06/07/2019   K 4.3 06/07/2019   CL 109 06/07/2019   CREATININE 1.96 (H) 06/07/2019   BUN 65 (H) 06/07/2019   CO2 25 06/07/2019   INR 3.7 (H) 06/07/2019   HGBA1C 5.5 05/22/2019    Ct Abdomen Pelvis Wo Contrast  Result Date: 06/04/2019 CLINICAL DATA:  Aortic disease, exchange of Impella for ventricular vent. EXAM: CT CHEST, ABDOMEN, AND PELVIS without CONTRAST TECHNIQUE: Multidetector CT imaging of the chest, abdomen and pelvis was performed following the standard protocol without intravenous contrast CONTRAST:  None COMPARISON:  None. FINDINGS: CT CHEST FINDINGS Cardiovascular: Interval sternotomy and removal of Impella device with placement of ventricular ECMO component, tip in the left ventricular apex. Small amount of hematoma in the anterior mediastinum. Also with aortic cannula in place passing through upper margin of sternotomy. Surgical drains are in place in this location. Subxiphoid mediastinal drain as well. Endotracheal tube terminates approximately 1.4 cm above the carina. Material within the bronchi  peripheral to the tube. Right IJ central venous catheter terminating distal superior vena cava along with a right sided venous access device, likely PICC. Left-sided venous catheter terminates in the brachiocephalic just short of the veno veno ECMO cannula. Mediastinum/Nodes: Signs of stranding in the anterior mediastinum and small anterior mediastinal hematoma after sternotomy. Is Lungs/Pleura: Dense basilar consolidation bilaterally with patchy  areas of airspace opacity. Musculoskeletal: Signs of sternotomy as described. Also with signs of right axillary dissection and surgical drain in this location. CT ABDOMEN PELVIS FINDINGS Hepatobiliary: Liver is normal with sludge in the gallbladder. Pancreas: Normal appearance of pancreas. Spleen: Normal in size without focal abnormality. Adrenals/Urinary Tract: Normal appearance of the adrenal glands. Moderate size left renal cysts with renal sinus component measure approximately 2.9 x 4.3 cm. No signs of hydronephrosis. Stomach/Bowel: No signs of bowel obstruction or acute bowel process. Fluid-filled bowel with bowel management tube in place in the rectum. Vascular/Lymphatic: Right groin venous ECMO cannula in place terminating in the superior vena cava. Vascular findings in the chest as described. Dense atherosclerotic calcification of the abdominal aorta extending into branch vessels. Stranding about the left groin without organized hematoma may reflect previous vascular access. Reproductive: Prostate unremarkable by CT. Other: Ovoid iliacus hematoma moderately large 8.1 x 4.9 x 8.9 cm. No signs of free intraperitoneal air. No signs of ascites. Musculoskeletal: Fractures of fourth through seventh ribs on the right with mild displacement of the right seventh rib anteriorly Fractures of fourth through eighth ribs on the left with nondisplaced appearance but with a mildly displaced fracture of the left fourth costochondral elements. IMPRESSION: 1. Dense basilar  consolidation but with patchy areas of airspace opacity suspicious for worsening of pneumonia superimposed on volume loss in this patient on ECMO. 2. Status post sternotomy and removal of Impella device with placement of ventricular ECMO component, tip in the left ventricular apex. 3. Small amount of hematoma in the anterior mediastinum after sternotomy. 4. Veno veno ECMO cannula tip at the brachiocephalic junction, ports of the catheter are present in SVC and upper right atrium. 5. Arterial cannula in the aorta as well. Anterior mediastinum with small hematoma and mediastinal and pericardial drain in place. 6. Signs of right axillary soft tissue dissection and surgical drain in place. 7. ET tube approximately 1.3-1.4 cm above the carina. 8. Moderately large intramuscular hematoma of the left iliacus muscle. 9. Signs of bilateral rib fractures. 10. Results with respect to basilar consolidation, tube and line position and the presence of left iliacus hematoma were called by telephone at the time of interpretation on 06/04/2019 at 11:05 am to provider [facility] , who verbally acknowledged these results. 11. Additional findings of rib fractures were not discussed initially, will be called to the ordering clinician or representative by the Radiologist Assistant, and communication documented in the PACS or zVision Dashboard. Aortic Atherosclerosis (ICD10-I70.0). Electronically Signed   By: Zetta Bills M.D.   On: 06/04/2019 11:10   Dg Chest 1 View  Result Date: 06/02/2019 CLINICAL DATA:  Open heart surgery. EXAM: CHEST  1 VIEW COMPARISON:  Multiple recent chest films. The most recent is earlier from today at 9:33 a.m. FINDINGS: The endotracheal tube, right IJ catheter, right PICC line, right subclavian Impella device, left IJ catheter and left ventricular assist device are stable. Persistent but slightly improved asymmetric pulmonary edema. No large pleural effusions or pneumothorax. IMPRESSION: 1. Stable  support apparatus. 2. Slight improved lung aeration with slight decrease and asymmetric pattern of pulmonary edema. Electronically Signed   By: Marijo Sanes M.D.   On: 06/02/2019 12:48   Dg Chest 1 View  Result Date: 06/12/2019 CLINICAL DATA:  Endotracheal tube. EXAM: CHEST  1 VIEW COMPARISON:  June 02, 2019. FINDINGS: Stable cardiomediastinal silhouette. Stable endotracheal and nasogastric tubes. Stable bilateral jugular catheters. Stable right-sided PICC line. Stable left ventricular assist device. No pneumothorax is noted.  Stable bilateral lung opacities are noted. Bony thorax is unremarkable. IMPRESSION: Stable support apparatus.  Stable bilateral lung opacities. Electronically Signed   By: Marijo Conception M.D.   On: 06/24/2019 07:44   Dg Chest 1 View  Result Date: 05/20/2019 CLINICAL DATA:  Impella insertion EXAM: DG C-ARM 1-60 MIN; CHEST  1 VIEW COMPARISON:  Same-day chest radiographs FINDINGS: Intraoperative fluoroscopy demonstrates the presence of a transesophageal echo probe and placement of an Impella left ventricular assist device in expected positioning. Cardiomediastinal contours are similar to comparison exams. Evaluation of the lungs is limited these collimated fluoroscopic views. IMPRESSION: Fluoroscopy demonstrates presence of a transesophageal echo probe and Impella device in expected position. Electronically Signed   By: Lovena Le M.D.   On: 05/08/2019 22:25   Dg Chest 1 View  Result Date: 05/07/2019 CLINICAL DATA:  Impella insertion EXAM: CHEST  1 VIEW COMPARISON:  Same day radiographs FINDINGS: Stable satisfactory positioning of a transesophageal tube, endotracheal tube, and right upper extremity PICC. Interval placement of an Impella left ventricular assist device via the right axillary artery. Streaky atelectatic opacities are present in the lungs. Lungs are otherwise clear. Cardiomediastinal contours are stable. Atherosclerotic calcification of the aorta is noted.  IMPRESSION: 1. Interval placement of an Impella left ventricular assist device in expected position. 2. Stable satisfactory positioning of a transesophageal tube, endotracheal tube, and right upper extremity PICC. 3. Bibasilar atelectasis with improving opacity in the lung bases. Electronically Signed   By: Lovena Le M.D.   On: 04/27/2019 22:23   Dg Abd 1 View  Result Date: 05/30/2019 CLINICAL DATA:  Fluoroscopic guided feeding tube placement. EXAM: ABDOMEN - 1 VIEW COMPARISON:  Radiograph 06/10/2019 FINDINGS: Single fluoroscopic spot image demonstrates a feeding tube tip in the region of the fourth portion the duodenum. IMPRESSION: Feeding tube tip at the ligament of Treitz. Electronically Signed   By: Marijo Sanes M.D.   On: 05/30/2019 13:11   Dg Abd 1 View  Result Date: 06/24/2019 CLINICAL DATA:  OG tube placement EXAM: ABDOMEN - 1 VIEW COMPARISON:  May 28, 2019 FINDINGS: The tip of the enteric tube appears to terminate near the GE junction. Exact positioning is difficult to determine secondary to overlapping wires. The stomach is distended. The bowel gas pattern is otherwise nonspecific. An Impella is noted projecting over the left ventricle. IMPRESSION: 1. Enteric tube tip appears to terminate near the GE junction. Repositioning should be considered. 2. Gaseous distention of the stomach. Electronically Signed   By: Constance Holster M.D.   On: 06/18/2019 06:17   Ct Head Wo Contrast  Result Date: 06/04/2019 CLINICAL DATA:  Ataxia, possible stroke. EXAM: CT HEAD WITHOUT CONTRAST TECHNIQUE: Contiguous axial images were obtained from the base of the skull through the vertex without intravenous contrast. COMPARISON:  None. FINDINGS: Brain: Mild diffuse cortical atrophy is noted. Mild chronic ischemic white matter disease is noted. No mass effect or midline shift is noted. Ventricular size is within normal limits. There is no evidence of mass lesion, hemorrhage or acute infarction. Vascular: No  hyperdense vessel or unexpected calcification. Skull: Normal. Negative for fracture or focal lesion. Sinuses/Orbits: Mild mucosal thickening is noted in both maxillary sinuses. Other: Fluid is noted in the mastoid air cells bilaterally. IMPRESSION: Mild diffuse cortical atrophy. Mild chronic ischemic white matter disease. No acute intracranial abnormality seen. Electronically Signed   By: Marijo Conception M.D.   On: 06/04/2019 10:37   Ct Chest Wo Contrast  Result Date: 06/04/2019 CLINICAL DATA:  Aortic disease, exchange of Impella for ventricular vent. EXAM: CT CHEST, ABDOMEN, AND PELVIS without CONTRAST TECHNIQUE: Multidetector CT imaging of the chest, abdomen and pelvis was performed following the standard protocol without intravenous contrast CONTRAST:  None COMPARISON:  None. FINDINGS: CT CHEST FINDINGS Cardiovascular: Interval sternotomy and removal of Impella device with placement of ventricular ECMO component, tip in the left ventricular apex. Small amount of hematoma in the anterior mediastinum. Also with aortic cannula in place passing through upper margin of sternotomy. Surgical drains are in place in this location. Subxiphoid mediastinal drain as well. Endotracheal tube terminates approximately 1.4 cm above the carina. Material within the bronchi peripheral to the tube. Right IJ central venous catheter terminating distal superior vena cava along with a right sided venous access device, likely PICC. Left-sided venous catheter terminates in the brachiocephalic just short of the veno veno ECMO cannula. Mediastinum/Nodes: Signs of stranding in the anterior mediastinum and small anterior mediastinal hematoma after sternotomy. Is Lungs/Pleura: Dense basilar consolidation bilaterally with patchy areas of airspace opacity. Musculoskeletal: Signs of sternotomy as described. Also with signs of right axillary dissection and surgical drain in this location. CT ABDOMEN PELVIS FINDINGS Hepatobiliary: Liver is  normal with sludge in the gallbladder. Pancreas: Normal appearance of pancreas. Spleen: Normal in size without focal abnormality. Adrenals/Urinary Tract: Normal appearance of the adrenal glands. Moderate size left renal cysts with renal sinus component measure approximately 2.9 x 4.3 cm. No signs of hydronephrosis. Stomach/Bowel: No signs of bowel obstruction or acute bowel process. Fluid-filled bowel with bowel management tube in place in the rectum. Vascular/Lymphatic: Right groin venous ECMO cannula in place terminating in the superior vena cava. Vascular findings in the chest as described. Dense atherosclerotic calcification of the abdominal aorta extending into branch vessels. Stranding about the left groin without organized hematoma may reflect previous vascular access. Reproductive: Prostate unremarkable by CT. Other: Ovoid iliacus hematoma moderately large 8.1 x 4.9 x 8.9 cm. No signs of free intraperitoneal air. No signs of ascites. Musculoskeletal: Fractures of fourth through seventh ribs on the right with mild displacement of the right seventh rib anteriorly Fractures of fourth through eighth ribs on the left with nondisplaced appearance but with a mildly displaced fracture of the left fourth costochondral elements. IMPRESSION: 1. Dense basilar consolidation but with patchy areas of airspace opacity suspicious for worsening of pneumonia superimposed on volume loss in this patient on ECMO. 2. Status post sternotomy and removal of Impella device with placement of ventricular ECMO component, tip in the left ventricular apex. 3. Small amount of hematoma in the anterior mediastinum after sternotomy. 4. Veno veno ECMO cannula tip at the brachiocephalic junction, ports of the catheter are present in SVC and upper right atrium. 5. Arterial cannula in the aorta as well. Anterior mediastinum with small hematoma and mediastinal and pericardial drain in place. 6. Signs of right axillary soft tissue dissection and  surgical drain in place. 7. ET tube approximately 1.3-1.4 cm above the carina. 8. Moderately large intramuscular hematoma of the left iliacus muscle. 9. Signs of bilateral rib fractures. 10. Results with respect to basilar consolidation, tube and line position and the presence of left iliacus hematoma were called by telephone at the time of interpretation on 06/04/2019 at 11:05 am to provider [facility] , who verbally acknowledged these results. 11. Additional findings of rib fractures were not discussed initially, will be called to the ordering clinician or representative by the Radiologist Assistant, and communication documented in the PACS or zVision Dashboard. Aortic Atherosclerosis (  ICD10-I70.0). Electronically Signed   By: Zetta Bills M.D.   On: 06/04/2019 11:10   Ct Angio Chest Pe W And/or Wo Contrast  Result Date: 04/29/2019 CLINICAL DATA:  Shortness of breath. EXAM: CT ANGIOGRAPHY CHEST WITH CONTRAST TECHNIQUE: Multidetector CT imaging of the chest was performed using the standard protocol during bolus administration of intravenous contrast. Multiplanar CT image reconstructions and MIPs were obtained to evaluate the vascular anatomy. CONTRAST:  38m OMNIPAQUE IOHEXOL 350 MG/ML SOLN COMPARISON:  12/05/2017 FINDINGS: Cardiovascular: The main pulmonary artery is patent. There is no central obstructing or saddle embolus identified. No lobar pulmonary artery filling defects for segmental pulmonary artery filling defects identified. Normal heart size. No pericardial effusion. Aortic atherosclerosis. 3 vessel coronary artery calcifications. Mediastinum/Nodes: The endotracheal tube tip is above the carina. There is a nasogastric tube with tip in the stomach. No supraclavicular, axillary or mediastinal adenopathy. Diffuse increased soft tissue within bilateral hilar regions identified, new from previous exam. For example, within the right hilar region there is increased soft tissue measuring 3.7 x 2.9  cm. Within the left suprahilar region there is increased soft tissue measuring 3.1 x 2.0 cm. Lungs/Pleura: There is dense bilateral lower lobe posterior airspace consolidation. There is also bilateral upper lobe posterior subpleural consolidation. Diffuse interlobular septal thickening with areas of ground-glass attenuation identified compatible with pulmonary edema. Upper Abdomen: No acute abnormality. Musculoskeletal: No chest wall abnormality. No acute or significant osseous findings. Review of the MIP images confirms the above findings. IMPRESSION: 1. No evidence for acute pulmonary embolus. 2. Bilateral lower lobe posterior subpleural consolidation is identified compatible with multifocal pneumonia. 3. Diffuse interlobular septal thickening, ground-glass attenuation and areas of ground-glass attenuation compatible with pulmonary edema which may be secondary to heart failure 4. Interval development of bilateral hilar adenopathy which may be reactive in etiology. Differential considerations include granulomatous inflammation/infection, lymphoproliferative disorder or metastatic adenopathy. This is may also be nonspecific in the setting of CHF. Advise follow-up imaging in 3 months with repeat CT of the chest with contrast. This recommendation follows ACR consensus guidelines: Managing Incidental Findings on Thoracic CT: Mediastinal and Cardiovascular Findings. A White Paper of the ACR Incidental Findings Committee. J Am Coll Radiol. 2018; 15:: [id number] 5. 3 vessel coronary artery calcifications noted. Aortic Atherosclerosis (ICD10-I70.0). Electronically Signed   By: TKerby MoorsM.D.   On: 05/07/2019 20:18   Dg Chest Port 1 View  Result Date: 06/07/2019 CLINICAL DATA:  Intubation.  Chest tube. EXAM: PORTABLE CHEST 1 VIEW COMPARISON:  Left 06/2019. FINDINGS: Endotracheal tube, NG tube, left IJ line, Swan-Ganz catheter, mediastinal drainage tubes, bilateral chest tubes, ECMO device in stable position. No  pneumothorax. Prior cardiac valve replacement. Left atrial appendage clip noted stable position. Heart size stable. Diffuse bilateral pulmonary infiltrates/edema again noted. No prominent pleural effusion. IMPRESSION: 1. Lines and tubes including bilateral chest tubes in stable position. No pneumothorax. 2. Prior cardiac valve replacement. Left atrial appendage clip in stable position. Heart size stable. 3. Diffuse bilateral pulmonary infiltrates/edema again noted without interim change. Electronically Signed   By: TMarcello Moores Register   On: 06/07/2019 07:29   Dg Chest Port 1 View  Result Date: 05/30/2019 CLINICAL DATA:  Hypotension post ECMO.  Recent CABG. EXAM: PORTABLE CHEST 1 VIEW COMPARISON:  Chest x-ray from same day. FINDINGS: Unchanged endotracheal and enteric tubes. Unchanged bilateral chest tubes and mediastinal drain. Unchanged right internal jugular Swan-Ganz catheter with tip in the main pulmonary artery. Unchanged right upper extremity PICC line and bilateral internal jugular central  venous catheters. Unchanged ECMO catheters. Normal heart size. Prior AVR and left atrial appendage clipping. Hazy airspace disease both lungs, greater on the left, similar to prior study. No pleural effusion or pneumothorax. No acute osseous abnormality. IMPRESSION: 1. Stable support tubes and lines.  No pneumothorax. 2. Unchanged diffuse pulmonary edema. Electronically Signed   By: Titus Dubin M.D.   On: 06/16/2019 14:01   Dg Chest Port 1 View  Result Date: 06/04/2019 CLINICAL DATA:  Status post open heart surgery. EXAM: PORTABLE CHEST 1 VIEW COMPARISON:  Same day. FINDINGS: Stable position of endotracheal, nasogastric tubes, bilateral jugular catheters and right-sided PICC line. Stable bilateral chest tubes are noted without pneumothorax. Stable bilateral lung opacities are noted. Bony thorax is unremarkable. IMPRESSION: Stable support apparatus. Stable bilateral lung opacities. Stable bilateral chest tubes  without pneumothorax. Electronically Signed   By: Marijo Conception M.D.   On: 05/26/2019 13:12   Dg Chest Port 1 View  Result Date: 06/20/2019 CLINICAL DATA:  Endotracheal tube. EXAM: PORTABLE CHEST 1 VIEW COMPARISON:  June 05, 2019. FINDINGS: Endotracheal and nasogastric tubes are unchanged in position. Stable bilateral jugular catheters are noted. Right internal jugular catheter is noted with distal tip in expected position of main pulmonary artery. Bilateral chest tubes are noted without pneumothorax. Stable feeding tube is seen entering stomach. Slightly improved bilateral lung opacities are noted suggesting improving pulmonary edema. Bony thorax is unremarkable. IMPRESSION: Stable support apparatus. Stable bilateral chest tubes without pneumothorax. Improved bilateral lung opacities are noted. Electronically Signed   By: Marijo Conception M.D.   On: 06/14/2019 07:10   Dg Chest Port 1 View  Result Date: 06/12/2019 CLINICAL DATA:  Status post CABG. EXAM: PORTABLE CHEST 1 VIEW COMPARISON:  Earlier same day FINDINGS: 1804 hours. Endotracheal tube tip is 3.6 cm above the base of the carina. The NG tube tip is in the proximal stomach. Feeding catheter is transpyloric although the distal tip has not been included on the film. Right IJ pulmonary artery catheter tip is in the main pulmonary artery. A second right IJ line is probably in the SVC but is partially obscured. A right PICC line tip overlies the mid SVC. Left IJ central line tip projects over the central mediastinum, over the expected course of the innominate vein. This line has been pulled back in the interval since the prior study. ECMO device seen on the previous study is no longer evident. Patient is status post aortic valve replacement. Left atrial appendage occluded device noted. 2 left chest tubes evident. Lucency at the midline is compatible with pneumomediastinum and may be in part related to the median sternotomy. Bilateral diffuse airspace  opacity is consistent with pulmonary edema. No substantial pleural effusion. IMPRESSION: 1. Support apparatus as above. 2. Diffuse bilateral airspace disease compatible with pulmonary edema. . Electronically Signed   By: Misty Stanley M.D.   On: 06/19/2019 18:37   Dg Chest Port 1 View  Result Date: 05/30/2019 CLINICAL DATA:  68 year old male status post open heart surgery. EXAM: PORTABLE CHEST 1 VIEW COMPARISON:  Chest radiograph dated 06/04/2019 FINDINGS: Endotracheal tube remains above the carina in similar position. Enteric tube extends below the diaphragm with side-port just distal to the GE junction and tip in the proximal stomach. Left IJ central venous line and right-sided PICC and ECMO apparatus remain in similar position. Diffuse bilateral airspace and interstitial densities as well as subpleural densities of the upper lobes appears similar to prior radiograph. No pneumothorax. Stable cardiac silhouette. No acute  osseous pathology. IMPRESSION: 1. No significant interval change in the appearance of the lungs compared to prior radiograph. 2. Support lines and tubes are in similar position. Electronically Signed   By: Anner Crete M.D.   On: 05/28/2019 08:11   Dg Chest Port 1 View  Result Date: 06/04/2019 CLINICAL DATA:  ECMO. EXAM: PORTABLE CHEST 1 VIEW COMPARISON:  05/31/2019 FINDINGS: The patient remains intubated. The in scratched at the endotracheal tube is in stable position 2.5 cm above the carina. ECMO catheters are stable. Mediastinal drain is stable. Left IJ line is again noted. Diffuse interstitial edema is unchanged. IMPRESSION: 1. Stable appearance of interstitial edema. 2. Support apparatus, including ECMO catheters, are stable. Electronically Signed   By: San Morelle M.D.   On: 06/04/2019 06:09   Dg Chest Port 1 View  Result Date: 06/02/2019 CLINICAL DATA:  Status post cardiac surgery. EXAM: PORTABLE CHEST 1 VIEW COMPARISON:  June 03, 2019 FINDINGS: The endotracheal  tube terminates just above the carina. There is a right PICC line and right-sided central venous catheter that appear grossly stable in positioning. A mediastinal drain is noted. There is an enteric feeding tube that is looped in the region of the GE junction and may be kinked. There is a secondary enteric tube that projects below the left hemidiaphragm. The patient's Impella device has been removed. ECMO catheters are now identified. There is worsening pulmonary edema. No convincing pneumothorax. Probable moderate-sized bilateral pleural effusions. IMPRESSION: 1. Endotracheal tube terminates just above the carina. Repositioning should be considered. 2. Lines and tubes as above.  ECMO catheters are now identified. 3. Worsening pulmonary edema. Electronically Signed   By: Constance Holster M.D.   On: 06/12/2019 18:26   Dg Chest Port 1 View  Result Date: 06/02/2019 CLINICAL DATA:  Patient status post cardiac surgery. EXAM: PORTABLE CHEST 1 VIEW COMPARISON:  Chest radiograph 06/01/2019 FINDINGS: Right upper extremity PICC line tip projects over the superior vena cava. Left IJ approach central venous catheter tip projects over the superior vena cava. ETT terminates in the mid trachea. Enteric tube courses inferior to the diaphragm. Mediastinal drains project over the mediastinum. Impella device stable in position. ECMO cannula projects over the mediastinum. Stable cardiomegaly. Similar diffuse bilateral heterogeneous pulmonary opacities. No pleural effusion or pneumothorax. IMPRESSION: Support apparatus as above. Similar diffuse bilateral airspace opacities which may represent edema. Electronically Signed   By: Lovey Newcomer M.D.   On: 06/02/2019 10:08   Dg Chest Port 1 View  Result Date: 06/01/2019 CLINICAL DATA:  68 year old male with ischemic heart disease, status post initiation of veno-arterial ECMO, Impella left ventricular assist EXAM: PORTABLE CHEST 1 VIEW COMPARISON:  Multiple prior, most recent 11 6  2020 FINDINGS: Cardiomediastinal silhouette unchanged in size and contour with surgical changes of median sternotomy. Mediastinal drain projects over the cardiac silhouette. Venous and arterial ECMO cannula again project over the mediastinum. Right axillary approach Impella, unchanged. Unchanged right IJ sheath. Unchanged right upper extremity PICC. Unchanged left IJ central venous catheter. Endotracheal tube appears to terminate 3.1 cm above the carina. Gastric tube projects over the mediastinum terminating in the left upper quadrant. Enteric feeding tube projects over the mediastinum terminating out of the field of view. Diffuse reticulonodular opacities of the lungs, similar to the prior with low lung volumes. No large pleural effusion or pneumothorax. IMPRESSION: Similar appearance of the chest x-ray to the most recent prior, with unchanged V-a ECMO cannula, mediastinal drain, Impella left ventricular assist device. Similar appearance of reticulonodular opacities  throughout the lungs with no pneumothorax or large pleural effusion. Unchanged endotracheal tube terminating 3.1 cm above the carina. Unchanged gastric tube and enteric feeding tube. Unchanged right IJ sheath, left IJ central venous catheter, right upper extremity PICC. Electronically Signed   By: Corrie Mckusick D.O.   On: 06/01/2019 10:39   Dg Chest Port 1 View  Result Date: 05/31/2019 CLINICAL DATA:  Intubation. EXAM: PORTABLE CHEST 1 VIEW COMPARISON:  05/30/2019. FINDINGS: Endotracheal tube, Swan-Ganz catheter, left IJ line, right PICC line, mediastinal drainage catheter appear in stable position. NG tube and feeding tube tips below left hemidiaphragm. Impella device in stable position. Prior median sternotomy. Overlying surgical tubing appear to be in stable position. Heart size stable. Diffuse bilateral interstitial prominence noted, increased from prior exam. Interstitial edema and/or pneumonitis could present this fashion. Tiny right pleural  effusion cannot be excluded. No pneumothorax. IMPRESSION: 1. NG tube and feeding tube tips noted below left hemidiaphragm. Endotracheal tube, Swan-Ganz catheter, left IJ line, right PICC line, mediastinal drainage catheter appear stable position. No pneumothorax. 2. Impella device in stable position. Prior median sternotomy. Stable cardiomegaly. 3. Diffuse bilateral pulmonary interstitial prominence increased from prior exam. Interstitial edema and/or pneumonitis could present this fashion. Small right pleural effusion. Electronically Signed   By: Marcello Moores  Register   On: 05/31/2019 07:53   Dg Chest Port 1 View  Result Date: 05/30/2019 CLINICAL DATA:  Endotracheal tube placement EXAM: PORTABLE CHEST 1 VIEW COMPARISON:  May 29, 2019 FINDINGS: The Impella device is stable in positioning. The Swan-Ganz catheter tip projects over the main pulmonary artery. The enteric tube appears to extend below the left hemidiaphragm. Left-sided central venous catheter is stable in positioning. The endotracheal tube terminates just above the carina. The heart size is unchanged. The patient is status post prior median sternotomy. Upper lobe predominant hazy airspace opacities are again noted. There are bilateral pleural effusions. The right-sided PICC line appears stable in positioning. IMPRESSION: 1. Lines and tubes as above. The endotracheal tube terminates just above the carina. Repositioning should be considered. 2. Persistent upper lobe predominant hazy airspace opacities favored to represent pulmonary edema. Electronically Signed   By: Constance Holster M.D.   On: 05/30/2019 07:00   Dg Chest Port 1 View  Result Date: 05/28/2019 CLINICAL DATA:  Post ECMO.  Hypertension. EXAM: PORTABLE CHEST 1 VIEW COMPARISON:  06/04/2019 FINDINGS: Previously seen support devices are stable. Presumed ECMO device now projects over the mediastinum. Bilateral airspace opacities are again noted, most pronounced in the upper lobes  bilaterally. These are stable since prior study. No pneumothorax. No effusions. IMPRESSION: No significant change in the appearance of the lungs since prior study. Electronically Signed   By: Rolm Baptise M.D.   On: 05/28/2019 20:42   Dg Chest Port 1 View  Result Date: 06/24/2019 CLINICAL DATA:  Intubation, check support apparatus EXAM: PORTABLE CHEST 1 VIEW COMPARISON:  06/15/2019, 2 a.m. FINDINGS: No significant change in AP portable examination. Extensive support apparatus includes endotracheal tube, esophagogastric tube, left neck multi lumen vascular catheter, right neck pulmonary arterial catheter, right upper extremity approach Impella device, and right upper extremity PICC, all in unchanged position. Unchanged diffuse bilateral interstitial and heterogeneous pulmonary opacity. Defibrillator pad applied about the chest. IMPRESSION: 1. No significant change in AP portable examination. Extensive support apparatus includes endotracheal tube, esophagogastric tube, left neck multi lumen vascular catheter, right neck pulmonary arterial catheter, right upper extremity approach Impella device, and right upper extremity PICC, all in unchanged position. 2. Unchanged diffuse  bilateral interstitial and heterogeneous pulmonary opacity, consistent with edema, infection, and/or ARDS. Electronically Signed   By: Eddie Candle M.D.   On: 05/31/2019 14:42   Dg Chest Port 1 View  Result Date: 06/04/2019 CLINICAL DATA:  ET tube placement.  Post CPR. EXAM: PORTABLE CHEST 1 VIEW COMPARISON:  05/28/2019 FINDINGS: Endotracheal tube is 3 cm above the carina. Left dialysis catheter remains in place, unchanged. Impella device remains within the heart, unchanged. Swan-Ganz catheter is in the central right pulmonary artery, also stable. Heart is normal size. Bilateral upper lobe airspace opacities again noted, increasing slightly since prior study. No effusions. No acute bony abnormality. IMPRESSION: Bilateral upper lobe airspace  opacities have worsened slightly since prior study. Endotracheal tube 3 cm above the carina. Electronically Signed   By: Rolm Baptise M.D.   On: 06/07/2019 02:15   Dg Chest Port 1 View  Result Date: 05/28/2019 CLINICAL DATA:  Shortness of breath.  CHF.  Impella device. EXAM: PORTABLE CHEST 1 VIEW COMPARISON:  05/27/2019. FINDINGS: Impella device, Swan-Ganz catheter, left IJ line stable position, right PICC line. Stable cardiomegaly. Diffuse bilateral pulmonary infiltrates/edema again noted. Similar findings noted on prior exam. No pleural effusion or pneumothorax. IMPRESSION: 1.  Lines in stable position.  Impella device in stable position. 2. Stable cardiomegaly. Diffuse bilateral pulmonary infiltrates/edema unchanged. Electronically Signed   By: Marcello Moores  Register   On: 05/28/2019 08:15   Dg Chest Port 1 View  Result Date: 05/27/2019 CLINICAL DATA:  Impella catheter placement EXAM: PORTABLE CHEST 1 VIEW COMPARISON:  05/25/2019 FINDINGS: The right upper extremity Impella projects over the expected region of the left ventricle. The Swan-Ganz catheter tip projects over the main pulmonary artery. The left-sided central venous catheter tip projects over the SVC/left brachiocephalic vein. Diffuse hazy bilateral airspace opacities with prominent interstitial lung markings are again noted. The heart size remains enlarged. There is no pneumothorax. IMPRESSION: 1. Lines and tubes as above.  No pneumothorax. 2. Worsening pulmonary edema. Electronically Signed   By: Constance Holster M.D.   On: 05/27/2019 05:49   Dg Chest Port 1 View  Result Date: 05/25/2019 CLINICAL DATA:  Follow-up exam.  CHF. EXAM: PORTABLE CHEST 1 VIEW COMPARISON:  05/24/2019 and older exams. FINDINGS: Bilateral interstitial thickening is similar to the previous day's study. No new lung abnormalities. No areas of consolidation. No convincing pleural effusion and no pneumothorax. Endotracheal tube, nasal/orogastric tube, right internal jugular  Swan-Ganz catheter, left internal jugular central venous line and right PICC are stable. Stable left ventricular assist device. IMPRESSION: 1. Bilateral interstitial thickening consistent with mild residual interstitial edema. This is improved compared to 05/23/2019. No new lung abnormalities. 2. Stable support apparatus. Electronically Signed   By: Lajean Manes M.D.   On: 05/25/2019 10:34   Dg Chest Port 1 View  Result Date: 05/24/2019 CLINICAL DATA:  Endotracheal intubation. EXAM: PORTABLE CHEST 1 VIEW COMPARISON:  May 23, 2019. FINDINGS: The heart size and mediastinal contours are within normal limits. Endotracheal and nasogastric tubes are unchanged. Right-sided PICC line is unchanged. Stable left internal jugular catheter. Stable Swan-Ganz catheter in the right internal jugular vein with tip directed toward right pulmonary artery. No pneumothorax or pleural effusion is noted. Stable left ventricular assist device is noted. No acute pulmonary disease is noted. The visualized skeletal structures are unremarkable. IMPRESSION: Stable support apparatus as described above. No acute cardiopulmonary abnormality seen. Electronically Signed   By: Marijo Conception M.D.   On: 05/24/2019 12:10   Dg Chest Port 1  View  Result Date: 05/23/2019 CLINICAL DATA:  Central line placement. EXAM: PORTABLE CHEST 1 VIEW COMPARISON:  Earlier film, same date. FINDINGS: The endotracheal tube, Swan-Ganz catheter, right PICC line, NG tube and Impella device are stable since the earlier film. New left IJ central venous catheter tip is near the brachiocephalic SVC junction. No complicating features. Improved aeration since the earlier film may suggest improving edema and atelectasis. IMPRESSION: 1. Support apparatus in good position without complicating features. 2. New left IJ catheter tip is in the mid SVC. 3. Improved lung aeration since earlier film today. Electronically Signed   By: Marijo Sanes M.D.   On: 05/23/2019 12:05    Dg Chest Port 1 View  Result Date: 05/23/2019 CLINICAL DATA:  Respiratory failure. EXAM: PORTABLE CHEST 1 VIEW COMPARISON:  Chest x-ray 05/22/2019 FINDINGS: The endotracheal tube is 4.4 cm above the carina. Right IJ Swan-Ganz catheter tip is in the proximal right pulmonary artery. Impella device and good position without complicating features. Right PICC line is stable. NG tube coursing down the esophagus and into the stomach. Stable appearance of the heart and mediastinum. Stable calcification of the thoracic aorta. Increasing density at the left lung base could be progressive atelectasis or infiltrate. Underlying edema and atelectasis appears stable. IMPRESSION: 1. Support apparatus in good position without complicating features. 2. Increasing density at the left lung base could be progressive atelectasis or infiltrate. 3. Stable underlying edema and atelectasis. Electronically Signed   By: Marijo Sanes M.D.   On: 05/23/2019 07:52   Dg Chest Port 1 View  Result Date: 05/22/2019 CLINICAL DATA:  Status post bronchoscopy. EXAM: PORTABLE CHEST 1 VIEW COMPARISON:  Same day. FINDINGS: Stable cardiomediastinal silhouette. No pneumothorax or pleural effusion is noted. Endotracheal and nasogastric tubes are unchanged in position. Left ventricular assist device is unchanged. Right internal jugular Swan-Ganz catheter is unchanged. No acute pulmonary disease is noted. Bony thorax is unremarkable. IMPRESSION: Stable support apparatus. No acute cardiopulmonary abnormality seen. Electronically Signed   By: Marijo Conception M.D.   On: 05/22/2019 11:36   Dg Chest Port 1 View  Result Date: 05/22/2019 CLINICAL DATA:  Respiratory distress EXAM: PORTABLE CHEST 1 VIEW COMPARISON:  05/21/2019 FINDINGS: Cardiac shadow is stable. Endotracheal tube, gastric catheter and Swan-Ganz catheter are noted in satisfactory position. Right-sided PICC line and Impella catheter are seen and stable. The lungs are well aerated  bilaterally. No focal infiltrate or sizable effusion is seen. No bony abnormality is seen. IMPRESSION: Tubes and lines as described above stable from the previous exam. No acute abnormality noted. Electronically Signed   By: Inez Catalina M.D.   On: 05/22/2019 01:33   Dg Chest Port 1 View  Result Date: 05/21/2019 CLINICAL DATA:  Respiratory failure. EXAM: PORTABLE CHEST 1 VIEW COMPARISON:  May 20, 2019. FINDINGS: Endotracheal nasogastric tubes are unchanged in position. Left ventricular assist device is unchanged in position. Right internal jugular Swan-Ganz catheter is noted with tip directed into right pulmonary artery. No pneumothorax or pleural effusion is noted. No acute pulmonary disease is noted. Bony thorax is unremarkable. IMPRESSION: Stable support apparatus. No acute cardiopulmonary abnormality seen. Electronically Signed   By: Marijo Conception M.D.   On: 05/21/2019 07:25   Dg Chest Port 1 View  Result Date: 05/20/2019 CLINICAL DATA:  Endotracheal tube placement EXAM: PORTABLE CHEST 1 VIEW COMPARISON:  05/20/2019 FINDINGS: Endotracheal tube 6.2 cm above the carina. NG tube extends below the hemidiaphragms into the stomach with the tip not visualized. Swan-Ganz catheter  directed into the right pulmonary artery. Impella ventricular assist device unchanged. Stable heart size. Improvement in the central vascular congestion. No focal airspace process, collapse or consolidation. Negative for edema, large effusion or pneumothorax. IMPRESSION: Stable support apparatus. Improved vascular congestion/mild edema pattern. Electronically Signed   By: Jerilynn Mages.  Shick M.D.   On: 05/20/2019 20:56   Dg Chest Port 1 View  Result Date: 05/20/2019 CLINICAL DATA:  Endotracheal tube placement, respiratory failure. EXAM: PORTABLE CHEST 1 VIEW COMPARISON:  05/19/2019 and 05/06/2019 FINDINGS: Endotracheal tube tip 4.8 cm above the carina. Enteric tube courses into the stomach and off the film as tip is not visualized.  Right IJ Swan-Ganz catheter unchanged with tip over the distal right main pulmonary artery. Right-sided PICC line unchanged with tip over the SVC. Impella ventricular assist device unchanged. Lungs are adequately inflated with subtle stable hazy density over the right upper lung and left infrahilar region. No effusion or pneumothorax. Cardiomediastinal silhouette and remainder of the exam is unchanged. IMPRESSION: Subtle stable hazy density over the right upper lung and left infrahilar region. Tubes and lines as described. Electronically Signed   By: Marin Olp M.D.   On: 05/20/2019 07:44   Dg Chest Port 1 View  Result Date: 05/19/2019 CLINICAL DATA:  Endotracheal tube EXAM: PORTABLE CHEST 1 VIEW COMPARISON:  Chest radiograph from 05/05/2019 FINDINGS: A right upper extremity peripherally inserted central venous catheter tip overlies the superior vena cava. An Impella left ventricular assist device is unchanged in position. An endotracheal tube terminates in the midthoracic trachea. An enteric tube terminates in the stomach. The heart size is normal. Vascular calcifications are seen in the aortic arch. The lungs are clear. There is no pleural effusion or pneumothorax. IMPRESSION: 1. No significant interval change in the appearance of the chest. Electronically Signed   By: Zerita Boers M.D.   On: 05/19/2019 15:24   Dg Chest Port 1 View  Result Date: 05/19/2019 CLINICAL DATA:  Central line placement EXAM: PORTABLE CHEST 1 VIEW COMPARISON:  Chest radiograph from the same day FINDINGS: There has been interval placement of a right internal jugular Swan-Ganz catheter with tip overlying the right pulmonary artery. A right upper extremity peripherally inserted central venous catheter tip overlies the superior vena cava. An Impella left ventricular assist device is unchanged in position. An endotracheal tube terminates in the midthoracic trachea. An enteric tube terminates in the stomach. The heart size is  normal. Vascular calcifications are seen in the aortic arch. The lungs are clear. There is no pleural effusion or pneumothorax. IMPRESSION: 1. Swan-Ganz catheter tip overlies the right pulmonary artery. 2. No pneumothorax. 3. No acute findings in the chest. Electronically Signed   By: Zerita Boers M.D.   On: 05/19/2019 15:23   Dg Chest Port 1 View  Result Date: 04/30/2019 CLINICAL DATA:  Respiratory failure EXAM: PORTABLE CHEST 1 VIEW COMPARISON:  Chest radiograph from one day prior. FINDINGS: Inferior approach Impella device terminates over the left ventricular apex. Endotracheal tube tip is 4.0 cm above the carina. Enteric tube enters stomach with the tip not seen on this image. Right PICC terminates over lower third of the SVC. Inferior approach Swan-Ganz catheter terminates over right infrahilar region. Stable cardiomediastinal silhouette with normal heart size. No pneumothorax. No pleural effusion. Lungs appear clear, with no acute consolidative airspace disease and no pulmonary edema. IMPRESSION: 1. Well-positioned support structures.  No pneumothorax. 2. No pulmonary edema. Electronically Signed   By: Ilona Sorrel M.D.   On: 05/15/2019 12:27  Dg Chest Port 1 View  Result Date: 05/06/2019 CLINICAL DATA:  Respiratory failure EXAM: PORTABLE CHEST 1 VIEW COMPARISON:  04/29/2019 FINDINGS: Endotracheal tube, gastric catheter and right-sided PICC line are noted in satisfactory position. Cardiac shadow is mildly enlarged but stable. Aortic calcifications are again seen. Vascular congestion is noted patchy parenchymal opacities bilaterally likely related to edema. Left retrocardiac density remains. No bony abnormality is seen. IMPRESSION: Vascular congestion and patchy opacities likely related to edema. Left retrocardiac atelectasis. Electronically Signed   By: Inez Catalina M.D.   On: 04/28/2019 07:37   Dg Chest Portable 1 View  Result Date: 05/15/2019 CLINICAL DATA:  Intubation. Image 2 after pulled  back. EXAM: PORTABLE CHEST 1 VIEW COMPARISON:  05/15/2019 FINDINGS: Endotracheal tube has been placed. On the initial image the tube is 3.6 centimeters above the carina. On the second image, tube is approximately 5.4 centimeters above the carina. Nasogastric tube is identified, tip beyond the level of the proximal stomach. Heart size is normal. There are hazy infiltrates throughout the lungs bilaterally, slightly increased since the prior study. Opacity at the LEFT lung base is more confluent. IMPRESSION: 1. Interval placement of endotracheal tube. 2. Increased bilateral infiltrates. 3. More confluent opacity at the LEFT lung base. Electronically Signed   By: Nolon Nations M.D.   On: 05/19/2019 18:57   Dg Chest Port 1 View  Result Date: 05/06/2019 CLINICAL DATA:  Chest pain. EXAM: PORTABLE CHEST 1 VIEW COMPARISON:  September 18, 2018. FINDINGS: The heart size and mediastinal contours are within normal limits. No pneumothorax or pleural effusion is noted. Multiple small ill-defined interstitial densities are noted throughout both lungs which may represent focal inflammation. The visualized skeletal structures are unremarkable. IMPRESSION: Multiple small ill-defined interstitial densities are noted throughout both lungs which potentially may represent multifocal inflammation or possibly edema. Electronically Signed   By: Marijo Conception M.D.   On: 05/07/2019 16:15   Dg Abd Portable 1v  Result Date: 05/28/2019 CLINICAL DATA:  Multiple instances of vomiting. EXAM: PORTABLE ABDOMEN - 1 VIEW COMPARISON:  Chest x-ray 05/28/2019. FINDINGS: Slightly prominent air-filled loops of small and large bowel noted. Adynamic ileus could present in this fashion. Follow-up exam suggested demonstrate resolution and to exclude bowel obstruction. No free air. Degenerative changes lumbar spine and both hips. Aortoiliac atherosclerotic vascular disease. IMPRESSION: Slightly prominent air-filled loops of small large bowel noted.  Adynamic ileus could present in this fashion. Follow-up exam suggested to demonstrate resolution and to exclude bowel obstruction. Electronically Signed   By: Marcello Moores  Register   On: 05/28/2019 14:08   Dg C-arm 1-60 Min  Result Date: 05/02/2019 CLINICAL DATA:  Impella insertion EXAM: DG C-ARM 1-60 MIN; CHEST  1 VIEW COMPARISON:  Same-day chest radiographs FINDINGS: Intraoperative fluoroscopy demonstrates the presence of a transesophageal echo probe and placement of an Impella left ventricular assist device in expected positioning. Cardiomediastinal contours are similar to comparison exams. Evaluation of the lungs is limited these collimated fluoroscopic views. IMPRESSION: Fluoroscopy demonstrates presence of a transesophageal echo probe and Impella device in expected position. Electronically Signed   By: Lovena Le M.D.   On: 05/15/2019 22:25   Vas [place] Upper Extremity Venous Duplex  Result Date: 05/22/2019 UPPER VENOUS STUDY  Indications: Edema Other Indications: Impella, PICC line, IV, all in right chest/arm. Limitations: Line and bandages. Comparison Study: No prior study on file for comparison. Performing Technologist: Sharion Dove RVS  Examination Guidelines: A complete evaluation includes B-mode imaging, spectral Doppler, color Doppler, and power Doppler  as needed of all accessible portions of each vessel. Bilateral testing is considered an integral part of a complete examination. Limited examinations for reoccurring indications may be performed as noted.  Right Findings: +----------+------------+---------+-----------+----------+---------------------+ RIGHT     CompressiblePhasicitySpontaneousProperties       Summary        +----------+------------+---------+-----------+----------+---------------------+ IJV                                                    Not visualized     +----------+------------+---------+-----------+----------+---------------------+ Subclavian                Yes       Yes               patent by color and                                                             Doppler        +----------+------------+---------+-----------+----------+---------------------+ Axillary                                               Not visualized     +----------+------------+---------+-----------+----------+---------------------+ Brachial      Full       Yes       Yes                                    +----------+------------+---------+-----------+----------+---------------------+ Radial        Full                                                        +----------+------------+---------+-----------+----------+---------------------+ Ulnar                                                  patent by color    +----------+------------+---------+-----------+----------+---------------------+ Cephalic      Full                                                        +----------+------------+---------+-----------+----------+---------------------+ Basilic       Full                                                        +----------+------------+---------+-----------+----------+---------------------+  Left Findings: +----------+------------+---------+-----------+----------+-------+ LEFT      CompressiblePhasicitySpontaneousPropertiesSummary +----------+------------+---------+-----------+----------+-------+ Subclavian  Yes       Yes                      +----------+------------+---------+-----------+----------+-------+  Summary:  Right: No evidence of deep vein thrombosis in the upper extremity. However, unable to visualize the IJV, axillary. No evidence of superficial vein thrombosis in the upper extremity. However, unable to visualize the IJV, axillary. No evidence of thrombosis in the . However, unable to visualize the IJV, axillary.  Left: No evidence of thrombosis in the subclavian.  *See table(s) above for  measurements and observations.  Diagnosing physician: Deitra Mayo MD Electronically signed by Deitra Mayo MD on 05/22/2019 at 6:24:27 AM.    Final    [place] Ekg Site Rite  Result Date: 05/15/2019 If Site Rite image not attached, placement could not be confirmed due to current cardiac rhythm.  ASSESSMENT AND PLAN:   1.  Thrombocytopenia 2.  Anemia 3.  Cardiogenic shock 4.  Acute liver failure secondary to shock 5.  Renal failure  -Previous labs have been reviewed.  His platelet count was normal on admission but has declined during this hospitalization.  He has an elevated INR and fibrinogen is low.  Suspect thrombocytopenia is due to cardiogenic shock and DIC but need to rule out other causes such as TTP and HIT.  I have requested a peripheral blood smear for review.  He had a HIT panel performed on 05/21/2019 which was negative and a repeat HIT panel was drawn 06/17/2019 is currently pending. Continue supportive care with platelet transfusions for platelet count less than 20,000 or active bleeding.  We will make further recommendations pending review of peripheral blood smear. -Anemia likely due to underlying sepsis, GI bleed, acute kidney injury.  His hemoglobin today is 9.8.  Transfuse PRBCs per ICU protocol.  Thank you for this referral.  Mikey Bussing, DNP, AGPCNP-BC, AOCNP  ADDENDUM: I saw and examined Mr. Comas.  This, unfortunately, is an incredibly bad and complicated issue.  He has cardiogenic shock.  With that, is the fact that he developed shock liver in I think this is going to dictate his hematologic issues.  I looked at his blood smear.  I do not see anything on the blood smear that looked significant.  I do not see nucleated red blood cells.  He had decreased white blood cells.  They were mature.  Platelets were very much decreased.  Platelets were well granulated.  I think that as long as his liver is failing, he is going to have a coagulopathy.  I see that his  INR is quite high.  He also has the thrombotic issues.  He has the thromboembolism with respect to his of the left ventricle.  He is on Angiomax right now.  I would not think that he would have HITT.  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  He has been massively transfused.  I really do not see any kind of intervention that we can offer for his thrombocytopenia.  I do not see anything that would suggest immune thrombocytopenia (ITP).  Again, I think that his hematologic issues are going to be dictated and corrected only if his liver improves.  Right now, his liver is not making any type of clotting factors.  I do not think vitamin K would really help.  FFP he has been given.  I do not see that steroids would be helpful for the thrombocytopenia.  I do not think that Nplate would be indicated in this kind of situation.  I would just try to keep his platelet count, if possible, above 30,000.  I think that whenever platelets he is making should be somewhat functional.  We have to remember that with bone marrow failure, there is the tendency to see "older" cells and these may not work as well.  I am absolutely amazed as to the technology that is being used for Mr. Werling.  This whole ECMO is really impressive in incredibly complex.  I realize that this is the best chance and probably only chance for Mr. Tingler to survive.  It is obvious that he has an incredible team that is working to try to help him.  I think he is going to have to be on the Angiomax because of his thromboembolic issues.  I think with all these tubes and stuff, his blood will definitely try to clot despite his coagulopathy.  We will follow along and try to help out as much as possible.  Lattie Haw, MD  Philippians 1:6

## 2019-06-08 ENCOUNTER — Inpatient Hospital Stay (HOSPITAL_COMMUNITY): Payer: PPO

## 2019-06-08 DIAGNOSIS — I5021 Acute systolic (congestive) heart failure: Secondary | ICD-10-CM

## 2019-06-08 DIAGNOSIS — Z515 Encounter for palliative care: Secondary | ICD-10-CM

## 2019-06-08 LAB — BPAM RBC
Blood Product Expiration Date: 202011182359
Blood Product Expiration Date: 202012122359
Blood Product Expiration Date: 202012122359
Blood Product Expiration Date: 202012122359
Blood Product Expiration Date: 202012122359
Blood Product Expiration Date: 202012132359
Blood Product Expiration Date: 202012132359
Blood Product Expiration Date: 202012132359
Blood Product Expiration Date: 202012132359
Blood Product Expiration Date: 202012142359
Blood Product Expiration Date: 202012142359
Blood Product Expiration Date: 202012162359
Blood Product Expiration Date: 202012162359
Blood Product Expiration Date: 202012162359
Blood Product Expiration Date: 202012162359
Blood Product Expiration Date: 202012162359
Blood Product Expiration Date: 202012162359
Blood Product Expiration Date: 202012162359
Blood Product Expiration Date: 202012162359
Blood Product Expiration Date: 202012162359
Blood Product Expiration Date: 202012172359
Blood Product Expiration Date: 202012172359
Blood Product Expiration Date: 202012172359
Blood Product Expiration Date: 202012172359
Blood Product Expiration Date: 202012172359
Blood Product Expiration Date: 202012172359
Blood Product Expiration Date: 202012172359
Blood Product Expiration Date: 202012172359
ISSUE DATE / TIME: 202011100916
ISSUE DATE / TIME: 202011100916
ISSUE DATE / TIME: 202011102242
ISSUE DATE / TIME: 202011110921
ISSUE DATE / TIME: 202011110921
ISSUE DATE / TIME: 202011110921
ISSUE DATE / TIME: 202011110921
ISSUE DATE / TIME: 202011111328
ISSUE DATE / TIME: 202011111328
ISSUE DATE / TIME: 202011111328
ISSUE DATE / TIME: 202011120758
ISSUE DATE / TIME: 202011120758
ISSUE DATE / TIME: 202011120804
ISSUE DATE / TIME: 202011120804
ISSUE DATE / TIME: 202011120853
ISSUE DATE / TIME: 202011120853
ISSUE DATE / TIME: 202011120853
ISSUE DATE / TIME: 202011120853
ISSUE DATE / TIME: 202011121332
ISSUE DATE / TIME: 202011121332
ISSUE DATE / TIME: 202011121332
ISSUE DATE / TIME: 202011121332
ISSUE DATE / TIME: 202011131318
ISSUE DATE / TIME: 202011131320
ISSUE DATE / TIME: 202011131425
ISSUE DATE / TIME: 202011131425
Unit Type and Rh: 5100
Unit Type and Rh: 5100
Unit Type and Rh: 5100
Unit Type and Rh: 5100
Unit Type and Rh: 5100
Unit Type and Rh: 5100
Unit Type and Rh: 5100
Unit Type and Rh: 5100
Unit Type and Rh: 5100
Unit Type and Rh: 5100
Unit Type and Rh: 5100
Unit Type and Rh: 5100
Unit Type and Rh: 5100
Unit Type and Rh: 5100
Unit Type and Rh: 5100
Unit Type and Rh: 5100
Unit Type and Rh: 5100
Unit Type and Rh: 5100
Unit Type and Rh: 5100
Unit Type and Rh: 5100
Unit Type and Rh: 5100
Unit Type and Rh: 5100
Unit Type and Rh: 5100
Unit Type and Rh: 5100
Unit Type and Rh: 5100
Unit Type and Rh: 5100
Unit Type and Rh: 5100
Unit Type and Rh: 5100

## 2019-06-08 LAB — TYPE AND SCREEN
ABO/RH(D): O POS
Antibody Screen: NEGATIVE
Unit division: 0
Unit division: 0
Unit division: 0
Unit division: 0
Unit division: 0
Unit division: 0
Unit division: 0
Unit division: 0
Unit division: 0
Unit division: 0
Unit division: 0
Unit division: 0
Unit division: 0
Unit division: 0
Unit division: 0
Unit division: 0
Unit division: 0
Unit division: 0
Unit division: 0
Unit division: 0
Unit division: 0
Unit division: 0
Unit division: 0
Unit division: 0
Unit division: 0
Unit division: 0
Unit division: 0
Unit division: 0

## 2019-06-08 LAB — POCT I-STAT 7, (LYTES, BLD GAS, ICA,H+H)
Acid-Base Excess: 7 mmol/L — ABNORMAL HIGH (ref 0.0–2.0)
Acid-Base Excess: 7 mmol/L — ABNORMAL HIGH (ref 0.0–2.0)
Acid-Base Excess: 5 mmol/L — ABNORMAL HIGH (ref 0.0–2.0)
Bicarbonate: 30.4 mmol/L — ABNORMAL HIGH (ref 20.0–28.0)
Bicarbonate: 30 mmol/L — ABNORMAL HIGH (ref 20.0–28.0)
Bicarbonate: 29.7 mmol/L — ABNORMAL HIGH (ref 20.0–28.0)
Calcium, Ion: 1.05 mmol/L — ABNORMAL LOW (ref 1.15–1.40)
Calcium, Ion: 1.04 mmol/L — ABNORMAL LOW (ref 1.15–1.40)
Calcium, Ion: 1.06 mmol/L — ABNORMAL LOW (ref 1.15–1.40)
HCT: 19 % — ABNORMAL LOW (ref 39.0–52.0)
HCT: 21 % — ABNORMAL LOW (ref 39.0–52.0)
HCT: 23 % — ABNORMAL LOW (ref 39.0–52.0)
Hemoglobin: 6.5 g/dL — CL (ref 13.0–17.0)
Hemoglobin: 7.1 g/dL — ABNORMAL LOW (ref 13.0–17.0)
Hemoglobin: 7.8 g/dL — ABNORMAL LOW (ref 13.0–17.0)
O2 Saturation: 100 %
O2 Saturation: 100 %
O2 Saturation: 100 %
Patient temperature: 36.7
Patient temperature: 36.6
Patient temperature: 36.6
Potassium: 4.6 mmol/L (ref 3.5–5.1)
Potassium: 4.7 mmol/L (ref 3.5–5.1)
Potassium: 4.7 mmol/L (ref 3.5–5.1)
Sodium: 143 mmol/L (ref 135–145)
Sodium: 143 mmol/L (ref 135–145)
Sodium: 143 mmol/L (ref 135–145)
TCO2: 32 mmol/L (ref 22–32)
TCO2: 31 mmol/L (ref 22–32)
TCO2: 31 mmol/L (ref 22–32)
pCO2 arterial: 37.1 mmHg (ref 32.0–48.0)
pCO2 arterial: 34.7 mmHg (ref 32.0–48.0)
pCO2 arterial: 42 mmHg (ref 32.0–48.0)
pH, Arterial: 7.521 — ABNORMAL HIGH (ref 7.350–7.450)
pH, Arterial: 7.544 — ABNORMAL HIGH (ref 7.350–7.450)
pH, Arterial: 7.456 — ABNORMAL HIGH (ref 7.350–7.450)
pO2, Arterial: 430 mmHg — ABNORMAL HIGH (ref 83.0–108.0)
pO2, Arterial: 471 mmHg — ABNORMAL HIGH (ref 83.0–108.0)
pO2, Arterial: 475 mmHg — ABNORMAL HIGH (ref 83.0–108.0)

## 2019-06-08 LAB — LACTATE DEHYDROGENASE: LDH: 3474 U/L — ABNORMAL HIGH (ref 98–192)

## 2019-06-08 LAB — CBC
HCT: 24.3 % — ABNORMAL LOW (ref 39.0–52.0)
HCT: 22 % — ABNORMAL LOW (ref 39.0–52.0)
Hemoglobin: 8.6 g/dL — ABNORMAL LOW (ref 13.0–17.0)
Hemoglobin: 7.7 g/dL — ABNORMAL LOW (ref 13.0–17.0)
MCH: 30.2 pg (ref 26.0–34.0)
MCH: 30.2 pg (ref 26.0–34.0)
MCHC: 35.4 g/dL (ref 30.0–36.0)
MCHC: 35 g/dL (ref 30.0–36.0)
MCV: 85.3 fL (ref 80.0–100.0)
MCV: 86.3 fL (ref 80.0–100.0)
Platelets: 31 10*3/uL — ABNORMAL LOW (ref 150–400)
Platelets: 31 10*3/uL — ABNORMAL LOW (ref 150–400)
RBC: 2.85 MIL/uL — ABNORMAL LOW (ref 4.22–5.81)
RBC: 2.55 MIL/uL — ABNORMAL LOW (ref 4.22–5.81)
RDW: 14.8 % (ref 11.5–15.5)
RDW: 14.9 % (ref 11.5–15.5)
WBC: 10.2 10*3/uL (ref 4.0–10.5)
WBC: 10.1 10*3/uL (ref 4.0–10.5)
nRBC: 0.6 % — ABNORMAL HIGH (ref 0.0–0.2)
nRBC: 0.6 % — ABNORMAL HIGH (ref 0.0–0.2)

## 2019-06-08 LAB — PREPARE FRESH FROZEN PLASMA
Unit division: 0
Unit division: 0

## 2019-06-08 LAB — BPAM FFP
Blood Product Expiration Date: 202011172359
Blood Product Expiration Date: 202011172359
ISSUE DATE / TIME: 202011122106
ISSUE DATE / TIME: 202011122106
Unit Type and Rh: 6200
Unit Type and Rh: 6200

## 2019-06-08 LAB — LACTIC ACID, PLASMA: Lactic Acid, Venous: 1.2 mmol/L (ref 0.5–1.9)

## 2019-06-08 LAB — COMPREHENSIVE METABOLIC PANEL
ALT: 969 U/L — ABNORMAL HIGH (ref 0–44)
AST: 1652 U/L — ABNORMAL HIGH (ref 15–41)
Albumin: 2.6 g/dL — ABNORMAL LOW (ref 3.5–5.0)
Alkaline Phosphatase: 71 U/L (ref 38–126)
Anion gap: 12 (ref 5–15)
BUN: 39 mg/dL — ABNORMAL HIGH (ref 8–23)
CO2: 24 mmol/L (ref 22–32)
Calcium: 7.6 mg/dL — ABNORMAL LOW (ref 8.9–10.3)
Chloride: 106 mmol/L (ref 98–111)
Creatinine, Ser: 1.29 mg/dL — ABNORMAL HIGH (ref 0.61–1.24)
GFR calc Af Amer: >60 mL/min (ref >60)
GFR calc non Af Amer: 56 mL/min — ABNORMAL LOW (ref >60)
Glucose, Bld: 77 mg/dL (ref 70–99)
Potassium: 4.6 mmol/L (ref 3.5–5.1)
Sodium: 142 mmol/L (ref 135–145)
Total Bilirubin: 24.7 mg/dL (ref 0.3–1.2)
Total Protein: 4.2 g/dL — ABNORMAL LOW (ref 6.5–8.1)

## 2019-06-08 LAB — RETICULOCYTES
Immature Retic Fract: 30.2 % — ABNORMAL HIGH (ref 2.3–15.9)
RBC.: 2.55 MIL/uL — ABNORMAL LOW (ref 4.22–5.81)
Retic Count, Absolute: 37.5 10*3/uL (ref 19.0–186.0)
Retic Ct Pct: 1.5 % (ref 0.4–3.1)

## 2019-06-08 LAB — PREPARE PLATELET PHERESIS: Unit division: 0

## 2019-06-08 LAB — BPAM PLATELET PHERESIS
Blood Product Expiration Date: 202011152359
ISSUE DATE / TIME: 202011132026
Unit Type and Rh: 6200

## 2019-06-08 LAB — PHOSPHORUS: Phosphorus: 4.3 mg/dL (ref 2.5–4.6)

## 2019-06-08 LAB — FIBRINOGEN: Fibrinogen: 255 mg/dL (ref 210–475)

## 2019-06-08 LAB — PROTIME-INR
INR: 6 (ref 0.8–1.2)
Prothrombin Time: 52.3 seconds — ABNORMAL HIGH (ref 11.4–15.2)

## 2019-06-08 LAB — PREPARE RBC (CROSSMATCH)

## 2019-06-08 LAB — COOXEMETRY PANEL
Carboxyhemoglobin: 3.1 % — ABNORMAL HIGH (ref 0.5–1.5)
Methemoglobin: 1.4 % (ref 0.0–1.5)
O2 Saturation: 82.3 %
Total hemoglobin: 7.4 g/dL — ABNORMAL LOW (ref 12.0–16.0)

## 2019-06-08 LAB — APTT
aPTT: 73 seconds — ABNORMAL HIGH (ref 24–36)
aPTT: 72 seconds — ABNORMAL HIGH (ref 24–36)

## 2019-06-08 LAB — ECHOCARDIOGRAM LIMITED
Height: 67 in
Weight: 2955.93 oz

## 2019-06-08 LAB — MAGNESIUM: Magnesium: 2.5 mg/dL — ABNORMAL HIGH (ref 1.7–2.4)

## 2019-06-08 MED ORDER — GLYCOPYRROLATE 1 MG PO TABS
1.0000 mg | ORAL_TABLET | ORAL | Status: DC | PRN
  Filled 2019-06-08: qty 1

## 2019-06-08 MED ORDER — GLYCOPYRROLATE 0.2 MG/ML IJ SOLN: 0.2000 mg | INTRAMUSCULAR | Status: DC | PRN

## 2019-06-08 MED ORDER — GLYCOPYRROLATE 0.2 MG/ML IJ SOLN: 0.6000 mg | Freq: Four times a day (QID) | INTRAMUSCULAR | Status: DC

## 2019-06-08 MED ORDER — POLYVINYL ALCOHOL 1.4 % OP SOLN
1.0000 [drp] | Freq: Four times a day (QID) | OPHTHALMIC | Status: DC | PRN
  Filled 2019-06-08: qty 15

## 2019-06-08 MED ORDER — FENTANYL BOLUS VIA INFUSION
100.0000 ug | INTRAVENOUS | Status: DC | PRN
  Filled 2019-06-08: qty 200

## 2019-06-08 MED ORDER — PROPOFOL BOLUS VIA INFUSION
1.0000 mg/kg | INTRAVENOUS | Status: DC | PRN
  Filled 2019-06-08: qty 84

## 2019-06-08 MED ORDER — ALBUTEROL SULFATE (2.5 MG/3ML) 0.083% IN NEBU
2.5000 mg | INHALATION_SOLUTION | RESPIRATORY_TRACT | Status: DC | PRN
  Administered 2019-06-08: 01:00:00 2.5 mg via RESPIRATORY_TRACT

## 2019-06-08 MED ORDER — HALOPERIDOL LACTATE 2 MG/ML PO CONC
0.5000 mg | ORAL | Status: DC | PRN
  Filled 2019-06-08: qty 0.3

## 2019-06-08 MED ORDER — HALOPERIDOL LACTATE 5 MG/ML IJ SOLN: 0.5000 mg | INTRAMUSCULAR | Status: DC | PRN

## 2019-06-08 MED ORDER — HALOPERIDOL 0.5 MG PO TABS
0.5000 mg | ORAL_TABLET | ORAL | Status: DC | PRN
  Filled 2019-06-08: qty 1

## 2019-06-08 MED ORDER — SODIUM CHLORIDE 0.9% IV SOLUTION: Freq: Once | INTRAVENOUS | Status: DC

## 2019-06-08 MED ORDER — PROPOFOL 1000 MG/100ML IV EMUL
5.0000 ug/kg/min | INTRAVENOUS | Status: DC
  Administered 2019-06-08: 20 ug/kg/min via INTRAVENOUS
  Filled 2019-06-08: qty 100

## 2019-06-08 MED ORDER — BIOTENE DRY MOUTH MT LIQD: 15.0000 mL | OROMUCOSAL | Status: DC | PRN

## 2019-06-08 MED ORDER — ALBUTEROL SULFATE (2.5 MG/3ML) 0.083% IN NEBU
INHALATION_SOLUTION | RESPIRATORY_TRACT | Status: AC
  Administered 2019-06-08: 2.5 mg via RESPIRATORY_TRACT
  Filled 2019-06-08: qty 3

## 2019-06-09 LAB — TYPE AND SCREEN
ABO/RH(D): O POS
Antibody Screen: NEGATIVE
Unit division: 0
Unit division: 0

## 2019-06-09 LAB — BPAM RBC
Blood Product Expiration Date: 202012172359
Blood Product Expiration Date: 202012172359
ISSUE DATE / TIME: 202011140423
ISSUE DATE / TIME: 202011140622
Unit Type and Rh: 5100
Unit Type and Rh: 5100

## 2019-06-10 MED FILL — Indocyanine Green For IV Soln 25 MG: INTRAVENOUS | Qty: 25 | Status: AC

## 2019-06-10 MED FILL — Potassium Chloride Inj 2 mEq/ML: INTRAVENOUS | Qty: 40 | Status: AC

## 2019-06-10 MED FILL — Lidocaine HCl Local Preservative Free (PF) Inj 2%: INTRAMUSCULAR | Qty: 15 | Status: AC

## 2019-06-10 MED FILL — Heparin Sodium (Porcine) Inj 1000 Unit/ML: INTRAMUSCULAR | Qty: 30 | Status: AC

## 2019-06-11 MED FILL — Sodium Chloride IV Soln 0.9%: INTRAVENOUS | Qty: 3000 | Status: AC

## 2019-06-11 MED FILL — Electrolyte-R (PH 7.4) Solution: INTRAVENOUS | Qty: 4000 | Status: AC

## 2019-06-11 MED FILL — Sodium Bicarbonate IV Soln 8.4%: INTRAVENOUS | Qty: 200 | Status: AC

## 2019-06-11 MED FILL — Lidocaine HCl Local Soln Prefilled Syringe 100 MG/5ML (2%): INTRAMUSCULAR | Qty: 5 | Status: AC

## 2019-06-11 MED FILL — Mannitol IV Soln 20%: INTRAVENOUS | Qty: 500 | Status: AC

## 2019-06-11 MED FILL — Heparin Sodium (Porcine) Inj 1000 Unit/ML: INTRAMUSCULAR | Qty: 10 | Status: AC

## 2019-06-11 MED FILL — Sodium Chloride IV Soln 0.9%: INTRAVENOUS | Qty: 2000 | Status: AC

## 2019-06-11 MED FILL — Sodium Bicarbonate IV Soln 8.4%: INTRAVENOUS | Qty: 50 | Status: AC

## 2019-06-11 MED FILL — Calcium Chloride Inj 10%: INTRAVENOUS | Qty: 10 | Status: AC

## 2019-06-11 MED FILL — Sodium Bicarbonate IV Soln 8.4%: INTRAVENOUS | Qty: 100 | Status: AC

## 2019-06-11 MED FILL — Electrolyte-R (PH 7.4) Solution: INTRAVENOUS | Qty: 7000 | Status: AC

## 2019-06-12 NOTE — Op Note (Signed)
Procedure(s): EXPLORATION OF CHEST--bedside   Raymond Chavez male 68 y.o. 06/12/2019  Procedure(s) and Anesthesia Type:    * EXPLORATION OF CHEST - General    Surgeon(s) and Role:    Wonda Olds, MD - Primary   Indications: The patient is supported for ECMO but flows are decreased. Chest exploration is performed to r/o tamponade.      Surgeon: Wonda Olds   Assistants: OR staff  Anesthesia: General endotracheal anesthesia  ASA Class: 5    Procedure Detail  EXPLORATION OF CHEST,   The anterior chest was cleansed and draped sterilely. A preoperative pause was performed. The existing membrane was incised. There was not much clot evident in the chest, but it was crowded. Opening the chest improved the hemodynamics. One vein graft to the right coronary branches was compressed by the right atrial cannula. The graft was incised and clot was removed, allowing return of flow into the graft. Copious irrigation was performed. A new membrane was sutured to the skin edges and a sterile dressing was applied. All sponge and instrument counts were correct.  Estimated Blood Loss:  less than 100 mL         Drains: same         Total IV Fluids: minimal  Blood Given: none         Specimens: none         Implants: none        Complications:  * No complications entered in OR log *         Disposition: ICU - intubated and critically ill.         Condition: unstable  ,Raymond Fillingim [name], Raymond Chavez

## 2019-06-12 NOTE — Op Note (Signed)
Procedure(s): EXPLORATION OF CHEST TRANSESOPHAGEAL ECHOCARDIOGRAM (TEE) Cannulation For Ecmo (Extracorporeal Membrane Oxygenation) Indocyanine Green Fluorescence Imaging (Icg) Procedure Note  Nayquan Evinger male 68 y.o. 06/12/2019  Procedure(s) and Anesthesia Type:    * EXPLORATION OF CHEST - General    * TRANSESOPHAGEAL ECHOCARDIOGRAM (TEE) - General    * Cannulation For Ecmo (Extracorporeal Membrane Oxygenation) - General    * Indocyanine Green Fluorescence Imaging (Icg) - General  Surgeon(s) and Role:    Wonda Olds, MD - Primary   Indications: The patient is POD #1 s/p AVR/CABG with acidosis and biventricular failure. He is returned to the OR emergently for support     Surgeon: Wonda Olds   Assistants: staff  Anesthesia: General endotracheal anesthesia  ASA Class: 5    Procedure Detail  EXPLORATION OF CHEST, TRANSESOPHAGEAL ECHOCARDIOGRAM (TEE), Cannulation For Ecmo (Extracorporeal Membrane Oxygenation), Indocyanine Green Fluorescence Imaging (Icg) The patient's chest was opened urgently on arrival to the OR. Manual cardiac massage was performed. Full heparinization was achieved. Cannulation for CPB was performed (aortic and right atrial). He was placed on CPB and resuscitated. Intraoperative labs suggested profound anemia but there was no blood in the chest and no active bleeding.  After correcting his labs and metabolic status, he was placed on VA ecmo using the same cannulation strategy. He was transferred to the ICU in critical condition.   Estimated Blood Loss:  less than 100 mL         Drains: 4 mediastinal and pleural tubes         Total IV Fluids: per anes record   Blood Given:  Per anes record         Specimens: none         Implants: none        Complications:  * No complications entered in OR log *         Disposition: ICU - intubated and critically ill.         Condition: unstable  Giannis Corpuz Z. [name], [place]

## 2019-06-18 MED FILL — Heparin Sodium (Porcine) Inj 1000 Unit/ML: INTRAMUSCULAR | Qty: 30 | Status: AC

## 2019-06-24 NOTE — OR Nursing (Signed)
LATE ENTRY: On 06/07/2019 at 1554, bedside procedure performed by Dr. [name].  Procedure performed: Mediastinal exploration and embolectomy of greater saphenous vein bypass graft.  Paper operative record completed.  Will be sent to scan and placed into media tab.

## 2019-06-25 NOTE — Progress Notes (Signed)
eLink Physician-Brief Progress Note Patient Name: Raymond Chavez DOB: 12-14-1950 MRN: [id number]   Date of Service  06/11/2019  HPI/Events of Note  Pt with reduced tidal volume and minute ventilation likely due to changes in lung compliance.  eICU Interventions  PC increased to 22, PEEP remains at 10.  Tidal volume and minute ventilation improved to prior baseline. ABG at 3 a.m.        Kerry Kass Ogan 05/26/2019, 1:41 AM

## 2019-06-25 NOTE — Discharge Summary (Signed)
DEATH SUMMARY   Patient Details  Name: Raymond Chavez MRN: [id number] DOB: 1950-10-07  Admission/Discharge Information   Admit Date:  2019-06-13  Date of Death: Date of Death: 07-06-2019  Time of Death: Time of Death: 01-Oct-1332  Length of Stay: 10-14-2022  Referring Physician: Alroy Dust, L.Marlou Sa, MD   Reason(s) for Hospitalization  Congestive heart failure with subsequent complications   Diagnoses  Preliminary cause of death:  Secondary Diagnoses (including complications and co-morbidities):  Active Problems:   Acute hypoxemic respiratory failure (HCC)   Acute respiratory failure (HCC)   Cardiogenic shock (HCC)   Acute renal failure (HCC)   Hemoptysis   Endotracheal tube present   CHF (congestive heart failure) (HCC)   Acute on chronic combined systolic and diastolic CHF (congestive heart failure) (HCC)   S/P CABG x 4   S/P aortic valve replacement with bioprosthetic valve   Coronary artery disease   Pressure injury of skin   Palliative care encounter   Brief Hospital Course (including significant findings, care, treatment, and services provided and events leading to death)  [name] is a 68 y.o. year old male who was in his [place] until 13-Jun-2019 when he developed chest pain and shortness of breath. He presented to the [facility] ED where he was found to be in cardiogenic shock. There was evidence for NSTEMI. He was taken to the cath lab where left heart catheterization was performed showing multivessel CAD, pulmonary hypertension, and severely depressed LV function. An impella CP device was placed for support and he was admitted to the [facility] ICU. By hospital day 3, he had evidence of hemolysis and was inadequately supported. He was taken to the OR on HD 3 for Impella 5.0 LVAD insertion and removal of the groin CP. He did reasonably well afterwards but suffered renal failure and ventilator dependence. He was aggressively managed to the point of extubation on 05/28/19 but suffered respiratory  distress later that night requiring reintubation. The following day, a multidisciplinary team agreed that in order to improve he would require ECMO support which was instituted with central cannulation. He stayed on ECMO support for 7 days and was ultimately taken to the OR for AVR/CABG on 06/17/2019. He was able to separate from ECMO afterwards but by the morning of post operative day 1 he was acidotic and returned to the OR with biventricular failure. He was resuscitated on cardiopulmonary bypass and transitioned back to ECMO with the hope of ventricular recovery. By 72 hours later, he had evidence of profound liver dysfunction and discussion was made with the family to withdraw care.     Pertinent Labs and Studies  Significant Diagnostic Studies Ct Abdomen Pelvis Wo Contrast  Result Date: 06/04/2019 CLINICAL DATA:  Aortic disease, exchange of Impella for ventricular vent. EXAM: CT CHEST, ABDOMEN, AND PELVIS without CONTRAST TECHNIQUE: Multidetector CT imaging of the chest, abdomen and pelvis was performed following the standard protocol without intravenous contrast CONTRAST:  None COMPARISON:  None. FINDINGS: CT CHEST FINDINGS Cardiovascular: Interval sternotomy and removal of Impella device with placement of ventricular ECMO component, tip in the left ventricular apex. Small amount of hematoma in the anterior mediastinum. Also with aortic cannula in place passing through upper margin of sternotomy. Surgical drains are in place in this location. Subxiphoid mediastinal drain as well. Endotracheal tube terminates approximately 1.4 cm above the carina. Material within the bronchi peripheral to the tube. Right IJ central venous catheter terminating distal superior vena cava along with a right sided venous access device, likely  PICC. Left-sided venous catheter terminates in the brachiocephalic just short of the veno veno ECMO cannula. Mediastinum/Nodes: Signs of stranding in the anterior mediastinum and small  anterior mediastinal hematoma after sternotomy. Is Lungs/Pleura: Dense basilar consolidation bilaterally with patchy areas of airspace opacity. Musculoskeletal: Signs of sternotomy as described. Also with signs of right axillary dissection and surgical drain in this location. CT ABDOMEN PELVIS FINDINGS Hepatobiliary: Liver is normal with sludge in the gallbladder. Pancreas: Normal appearance of pancreas. Spleen: Normal in size without focal abnormality. Adrenals/Urinary Tract: Normal appearance of the adrenal glands. Moderate size left renal cysts with renal sinus component measure approximately 2.9 x 4.3 cm. No signs of hydronephrosis. Stomach/Bowel: No signs of bowel obstruction or acute bowel process. Fluid-filled bowel with bowel management tube in place in the rectum. Vascular/Lymphatic: Right groin venous ECMO cannula in place terminating in the superior vena cava. Vascular findings in the chest as described. Dense atherosclerotic calcification of the abdominal aorta extending into branch vessels. Stranding about the left groin without organized hematoma may reflect previous vascular access. Reproductive: Prostate unremarkable by CT. Other: Ovoid iliacus hematoma moderately large 8.1 x 4.9 x 8.9 cm. No signs of free intraperitoneal air. No signs of ascites. Musculoskeletal: Fractures of fourth through seventh ribs on the right with mild displacement of the right seventh rib anteriorly Fractures of fourth through eighth ribs on the left with nondisplaced appearance but with a mildly displaced fracture of the left fourth costochondral elements. IMPRESSION: 1. Dense basilar consolidation but with patchy areas of airspace opacity suspicious for worsening of pneumonia superimposed on volume loss in this patient on ECMO. 2. Status post sternotomy and removal of Impella device with placement of ventricular ECMO component, tip in the left ventricular apex. 3. Small amount of hematoma in the anterior mediastinum after  sternotomy. 4. Veno veno ECMO cannula tip at the brachiocephalic junction, ports of the catheter are present in SVC and upper right atrium. 5. Arterial cannula in the aorta as well. Anterior mediastinum with small hematoma and mediastinal and pericardial drain in place. 6. Signs of right axillary soft tissue dissection and surgical drain in place. 7. ET tube approximately 1.3-1.4 cm above the carina. 8. Moderately large intramuscular hematoma of the left iliacus muscle. 9. Signs of bilateral rib fractures. 10. Results with respect to basilar consolidation, tube and line position and the presence of left iliacus hematoma were called by telephone at the time of interpretation on 06/04/2019 at 11:05 am to provider [facility] , who verbally acknowledged these results. 11. Additional findings of rib fractures were not discussed initially, will be called to the ordering clinician or representative by the Radiologist Assistant, and communication documented in the PACS or zVision Dashboard. Aortic Atherosclerosis (ICD10-I70.0). Electronically Signed   By: Zetta Bills M.D.   On: 06/04/2019 11:10   Dg Chest 1 View  Result Date: 06/01/2019 CLINICAL DATA:  Open heart surgery. EXAM: CHEST  1 VIEW COMPARISON:  Multiple recent chest films. The most recent is earlier from today at 9:33 a.m. FINDINGS: The endotracheal tube, right IJ catheter, right PICC line, right subclavian Impella device, left IJ catheter and left ventricular assist device are stable. Persistent but slightly improved asymmetric pulmonary edema. No large pleural effusions or pneumothorax. IMPRESSION: 1. Stable support apparatus. 2. Slight improved lung aeration with slight decrease and asymmetric pattern of pulmonary edema. Electronically Signed   By: Marijo Sanes M.D.   On: 06/04/2019 12:48   Dg Chest 1 View  Result Date: 06/04/2019 CLINICAL DATA:  Endotracheal tube. EXAM: CHEST  1 VIEW COMPARISON:  June 02, 2019. FINDINGS: Stable  cardiomediastinal silhouette. Stable endotracheal and nasogastric tubes. Stable bilateral jugular catheters. Stable right-sided PICC line. Stable left ventricular assist device. No pneumothorax is noted. Stable bilateral lung opacities are noted. Bony thorax is unremarkable. IMPRESSION: Stable support apparatus.  Stable bilateral lung opacities. Electronically Signed   By: Marijo Conception M.D.   On: 06/07/2019 07:44   Dg Chest 1 View  Result Date: 05/21/2019 CLINICAL DATA:  Impella insertion EXAM: DG C-ARM 1-60 MIN; CHEST  1 VIEW COMPARISON:  Same-day chest radiographs FINDINGS: Intraoperative fluoroscopy demonstrates the presence of a transesophageal echo probe and placement of an Impella left ventricular assist device in expected positioning. Cardiomediastinal contours are similar to comparison exams. Evaluation of the lungs is limited these collimated fluoroscopic views. IMPRESSION: Fluoroscopy demonstrates presence of a transesophageal echo probe and Impella device in expected position. Electronically Signed   By: Lovena Le M.D.   On: 05/04/2019 22:25   Dg Chest 1 View  Result Date: 05/20/2019 CLINICAL DATA:  Impella insertion EXAM: CHEST  1 VIEW COMPARISON:  Same day radiographs FINDINGS: Stable satisfactory positioning of a transesophageal tube, endotracheal tube, and right upper extremity PICC. Interval placement of an Impella left ventricular assist device via the right axillary artery. Streaky atelectatic opacities are present in the lungs. Lungs are otherwise clear. Cardiomediastinal contours are stable. Atherosclerotic calcification of the aorta is noted. IMPRESSION: 1. Interval placement of an Impella left ventricular assist device in expected position. 2. Stable satisfactory positioning of a transesophageal tube, endotracheal tube, and right upper extremity PICC. 3. Bibasilar atelectasis with improving opacity in the lung bases. Electronically Signed   By: Lovena Le M.D.   On: 05/23/2019  22:23   Dg Abd 1 View  Result Date: 05/30/2019 CLINICAL DATA:  Fluoroscopic guided feeding tube placement. EXAM: ABDOMEN - 1 VIEW COMPARISON:  Radiograph 05/31/2019 FINDINGS: Single fluoroscopic spot image demonstrates a feeding tube tip in the region of the fourth portion the duodenum. IMPRESSION: Feeding tube tip at the ligament of Treitz. Electronically Signed   By: Marijo Sanes M.D.   On: 05/30/2019 13:11   Dg Abd 1 View  Result Date: 06/04/2019 CLINICAL DATA:  OG tube placement EXAM: ABDOMEN - 1 VIEW COMPARISON:  May 28, 2019 FINDINGS: The tip of the enteric tube appears to terminate near the GE junction. Exact positioning is difficult to determine secondary to overlapping wires. The stomach is distended. The bowel gas pattern is otherwise nonspecific. An Impella is noted projecting over the left ventricle. IMPRESSION: 1. Enteric tube tip appears to terminate near the GE junction. Repositioning should be considered. 2. Gaseous distention of the stomach. Electronically Signed   By: Constance Holster M.D.   On: 06/23/2019 06:17   Ct Head Wo Contrast  Result Date: 06/04/2019 CLINICAL DATA:  Ataxia, possible stroke. EXAM: CT HEAD WITHOUT CONTRAST TECHNIQUE: Contiguous axial images were obtained from the base of the skull through the vertex without intravenous contrast. COMPARISON:  None. FINDINGS: Brain: Mild diffuse cortical atrophy is noted. Mild chronic ischemic white matter disease is noted. No mass effect or midline shift is noted. Ventricular size is within normal limits. There is no evidence of mass lesion, hemorrhage or acute infarction. Vascular: No hyperdense vessel or unexpected calcification. Skull: Normal. Negative for fracture or focal lesion. Sinuses/Orbits: Mild mucosal thickening is noted in both maxillary sinuses. Other: Fluid is noted in the mastoid air cells bilaterally. IMPRESSION: Mild diffuse cortical atrophy.  Mild chronic ischemic white matter disease. No acute  intracranial abnormality seen. Electronically Signed   By: Marijo Conception M.D.   On: 06/04/2019 10:37   Ct Chest Wo Contrast  Result Date: 06/04/2019 CLINICAL DATA:  Aortic disease, exchange of Impella for ventricular vent. EXAM: CT CHEST, ABDOMEN, AND PELVIS without CONTRAST TECHNIQUE: Multidetector CT imaging of the chest, abdomen and pelvis was performed following the standard protocol without intravenous contrast CONTRAST:  None COMPARISON:  None. FINDINGS: CT CHEST FINDINGS Cardiovascular: Interval sternotomy and removal of Impella device with placement of ventricular ECMO component, tip in the left ventricular apex. Small amount of hematoma in the anterior mediastinum. Also with aortic cannula in place passing through upper margin of sternotomy. Surgical drains are in place in this location. Subxiphoid mediastinal drain as well. Endotracheal tube terminates approximately 1.4 cm above the carina. Material within the bronchi peripheral to the tube. Right IJ central venous catheter terminating distal superior vena cava along with a right sided venous access device, likely PICC. Left-sided venous catheter terminates in the brachiocephalic just short of the veno veno ECMO cannula. Mediastinum/Nodes: Signs of stranding in the anterior mediastinum and small anterior mediastinal hematoma after sternotomy. Is Lungs/Pleura: Dense basilar consolidation bilaterally with patchy areas of airspace opacity. Musculoskeletal: Signs of sternotomy as described. Also with signs of right axillary dissection and surgical drain in this location. CT ABDOMEN PELVIS FINDINGS Hepatobiliary: Liver is normal with sludge in the gallbladder. Pancreas: Normal appearance of pancreas. Spleen: Normal in size without focal abnormality. Adrenals/Urinary Tract: Normal appearance of the adrenal glands. Moderate size left renal cysts with renal sinus component measure approximately 2.9 x 4.3 cm. No signs of hydronephrosis. Stomach/Bowel: No  signs of bowel obstruction or acute bowel process. Fluid-filled bowel with bowel management tube in place in the rectum. Vascular/Lymphatic: Right groin venous ECMO cannula in place terminating in the superior vena cava. Vascular findings in the chest as described. Dense atherosclerotic calcification of the abdominal aorta extending into branch vessels. Stranding about the left groin without organized hematoma may reflect previous vascular access. Reproductive: Prostate unremarkable by CT. Other: Ovoid iliacus hematoma moderately large 8.1 x 4.9 x 8.9 cm. No signs of free intraperitoneal air. No signs of ascites. Musculoskeletal: Fractures of fourth through seventh ribs on the right with mild displacement of the right seventh rib anteriorly Fractures of fourth through eighth ribs on the left with nondisplaced appearance but with a mildly displaced fracture of the left fourth costochondral elements. IMPRESSION: 1. Dense basilar consolidation but with patchy areas of airspace opacity suspicious for worsening of pneumonia superimposed on volume loss in this patient on ECMO. 2. Status post sternotomy and removal of Impella device with placement of ventricular ECMO component, tip in the left ventricular apex. 3. Small amount of hematoma in the anterior mediastinum after sternotomy. 4. Veno veno ECMO cannula tip at the brachiocephalic junction, ports of the catheter are present in SVC and upper right atrium. 5. Arterial cannula in the aorta as well. Anterior mediastinum with small hematoma and mediastinal and pericardial drain in place. 6. Signs of right axillary soft tissue dissection and surgical drain in place. 7. ET tube approximately 1.3-1.4 cm above the carina. 8. Moderately large intramuscular hematoma of the left iliacus muscle. 9. Signs of bilateral rib fractures. 10. Results with respect to basilar consolidation, tube and line position and the presence of left iliacus hematoma were called by telephone at the  time of interpretation on 06/04/2019 at 11:05 am to provider Valen Mascaro ,  who verbally acknowledged these results. 11. Additional findings of rib fractures were not discussed initially, will be called to the ordering clinician or representative by the Radiologist Assistant, and communication documented in the PACS or zVision Dashboard. Aortic Atherosclerosis (ICD10-I70.0). Electronically Signed   By: Zetta Bills M.D.   On: 06/04/2019 11:10   Ct Angio Chest Pe W And/or Wo Contrast  Result Date: 05/15/2019 CLINICAL DATA:  Shortness of breath. EXAM: CT ANGIOGRAPHY CHEST WITH CONTRAST TECHNIQUE: Multidetector CT imaging of the chest was performed using the standard protocol during bolus administration of intravenous contrast. Multiplanar CT image reconstructions and MIPs were obtained to evaluate the vascular anatomy. CONTRAST:  79m OMNIPAQUE IOHEXOL 350 MG/ML SOLN COMPARISON:  12/05/2017 FINDINGS: Cardiovascular: The main pulmonary artery is patent. There is no central obstructing or saddle embolus identified. No lobar pulmonary artery filling defects for segmental pulmonary artery filling defects identified. Normal heart size. No pericardial effusion. Aortic atherosclerosis. 3 vessel coronary artery calcifications. Mediastinum/Nodes: The endotracheal tube tip is above the carina. There is a nasogastric tube with tip in the stomach. No supraclavicular, axillary or mediastinal adenopathy. Diffuse increased soft tissue within bilateral hilar regions identified, new from previous exam. For example, within the right hilar region there is increased soft tissue measuring 3.7 x 2.9 cm. Within the left suprahilar region there is increased soft tissue measuring 3.1 x 2.0 cm. Lungs/Pleura: There is dense bilateral lower lobe posterior airspace consolidation. There is also bilateral upper lobe posterior subpleural consolidation. Diffuse interlobular septal thickening with areas of ground-glass attenuation identified  compatible with pulmonary edema. Upper Abdomen: No acute abnormality. Musculoskeletal: No chest wall abnormality. No acute or significant osseous findings. Review of the MIP images confirms the above findings. IMPRESSION: 1. No evidence for acute pulmonary embolus. 2. Bilateral lower lobe posterior subpleural consolidation is identified compatible with multifocal pneumonia. 3. Diffuse interlobular septal thickening, ground-glass attenuation and areas of ground-glass attenuation compatible with pulmonary edema which may be secondary to heart failure 4. Interval development of bilateral hilar adenopathy which may be reactive in etiology. Differential considerations include granulomatous inflammation/infection, lymphoproliferative disorder or metastatic adenopathy. This is may also be nonspecific in the setting of CHF. Advise follow-up imaging in 3 months with repeat CT of the chest with contrast. This recommendation follows ACR consensus guidelines: Managing Incidental Findings on Thoracic CT: Mediastinal and Cardiovascular Findings. A White Paper of the ACR Incidental Findings Committee. J Am Coll Radiol. 2018; 15:: [id number] 5. 3 vessel coronary artery calcifications noted. Aortic Atherosclerosis (ICD10-I70.0). Electronically Signed   By: TKerby MoorsM.D.   On: 05/23/2019 20:18   Dg Chest Port 1 View  Result Date: 111/17/2020CLINICAL DATA:  Intubation.  Status post CABG. EXAM: PORTABLE CHEST 1 VIEW COMPARISON:  06/07/2019 FINDINGS: ETT tip in stable position above the carina. The nasogastric tube tip is above the GE junction. The side port is approximately 2 cm above the carina. There is a right internal jugular Swan-Ganz catheter with tip at the expected location of the right ventricular outflow tract. Left IJ catheter tip projects over the left brachiocephalic vein. ECMO device is stable. Left atrial clip and aortic valve prosthesis are noted. Heart size is normal. Diffuse bilateral interstitial and  airspace opacities are unchanged from previous exam. There are bilateral chest tubes in place without pneumothorax. IMPRESSION: 1. Stable position of support apparatus. Note: The nasogastric tube may be under advanced. The tip appears to be above the level of the GE junction. 2. No change in aeration to the  lungs compared with previous exam. Electronically Signed   By: Kerby Moors M.D.   On: 06/25/2019 04:14   Dg Chest Port 1 View  Result Date: 06/07/2019 CLINICAL DATA:  Intubation.  Chest tube. EXAM: PORTABLE CHEST 1 VIEW COMPARISON:  Left 06/2019. FINDINGS: Endotracheal tube, NG tube, left IJ line, Swan-Ganz catheter, mediastinal drainage tubes, bilateral chest tubes, ECMO device in stable position. No pneumothorax. Prior cardiac valve replacement. Left atrial appendage clip noted stable position. Heart size stable. Diffuse bilateral pulmonary infiltrates/edema again noted. No prominent pleural effusion. IMPRESSION: 1. Lines and tubes including bilateral chest tubes in stable position. No pneumothorax. 2. Prior cardiac valve replacement. Left atrial appendage clip in stable position. Heart size stable. 3. Diffuse bilateral pulmonary infiltrates/edema again noted without interim change. Electronically Signed   By: Marcello Moores  Register   On: 06/07/2019 07:29   Dg Chest Port 1 View  Result Date: 06/18/2019 CLINICAL DATA:  Hypotension post ECMO.  Recent CABG. EXAM: PORTABLE CHEST 1 VIEW COMPARISON:  Chest x-ray from same day. FINDINGS: Unchanged endotracheal and enteric tubes. Unchanged bilateral chest tubes and mediastinal drain. Unchanged right internal jugular Swan-Ganz catheter with tip in the main pulmonary artery. Unchanged right upper extremity PICC line and bilateral internal jugular central venous catheters. Unchanged ECMO catheters. Normal heart size. Prior AVR and left atrial appendage clipping. Hazy airspace disease both lungs, greater on the left, similar to prior study. No pleural effusion or  pneumothorax. No acute osseous abnormality. IMPRESSION: 1. Stable support tubes and lines.  No pneumothorax. 2. Unchanged diffuse pulmonary edema. Electronically Signed   By: Titus Dubin M.D.   On: 06/21/2019 14:01   Dg Chest Port 1 View  Result Date: 05/27/2019 CLINICAL DATA:  Status post open heart surgery. EXAM: PORTABLE CHEST 1 VIEW COMPARISON:  Same day. FINDINGS: Stable position of endotracheal, nasogastric tubes, bilateral jugular catheters and right-sided PICC line. Stable bilateral chest tubes are noted without pneumothorax. Stable bilateral lung opacities are noted. Bony thorax is unremarkable. IMPRESSION: Stable support apparatus. Stable bilateral lung opacities. Stable bilateral chest tubes without pneumothorax. Electronically Signed   By: Marijo Conception M.D.   On: 06/14/2019 13:12   Dg Chest Port 1 View  Result Date: 06/13/2019 CLINICAL DATA:  Endotracheal tube. EXAM: PORTABLE CHEST 1 VIEW COMPARISON:  June 05, 2019. FINDINGS: Endotracheal and nasogastric tubes are unchanged in position. Stable bilateral jugular catheters are noted. Right internal jugular catheter is noted with distal tip in expected position of main pulmonary artery. Bilateral chest tubes are noted without pneumothorax. Stable feeding tube is seen entering stomach. Slightly improved bilateral lung opacities are noted suggesting improving pulmonary edema. Bony thorax is unremarkable. IMPRESSION: Stable support apparatus. Stable bilateral chest tubes without pneumothorax. Improved bilateral lung opacities are noted. Electronically Signed   By: Marijo Conception M.D.   On: 06/16/2019 07:10   Dg Chest Port 1 View  Result Date: 05/30/2019 CLINICAL DATA:  Status post CABG. EXAM: PORTABLE CHEST 1 VIEW COMPARISON:  Earlier same day FINDINGS: 1804 hours. Endotracheal tube tip is 3.6 cm above the base of the carina. The NG tube tip is in the proximal stomach. Feeding catheter is transpyloric although the distal tip has not  been included on the film. Right IJ pulmonary artery catheter tip is in the main pulmonary artery. A second right IJ line is probably in the SVC but is partially obscured. A right PICC line tip overlies the mid SVC. Left IJ central line tip projects over the central mediastinum, over  the expected course of the innominate vein. This line has been pulled back in the interval since the prior study. ECMO device seen on the previous study is no longer evident. Patient is status post aortic valve replacement. Left atrial appendage occluded device noted. 2 left chest tubes evident. Lucency at the midline is compatible with pneumomediastinum and may be in part related to the median sternotomy. Bilateral diffuse airspace opacity is consistent with pulmonary edema. No substantial pleural effusion. IMPRESSION: 1. Support apparatus as above. 2. Diffuse bilateral airspace disease compatible with pulmonary edema. . Electronically Signed   By: Misty Stanley M.D.   On: 06/20/2019 18:37   Dg Chest Port 1 View  Result Date: 06/20/2019 CLINICAL DATA:  68 year old male status post open heart surgery. EXAM: PORTABLE CHEST 1 VIEW COMPARISON:  Chest radiograph dated 06/04/2019 FINDINGS: Endotracheal tube remains above the carina in similar position. Enteric tube extends below the diaphragm with side-port just distal to the GE junction and tip in the proximal stomach. Left IJ central venous line and right-sided PICC and ECMO apparatus remain in similar position. Diffuse bilateral airspace and interstitial densities as well as subpleural densities of the upper lobes appears similar to prior radiograph. No pneumothorax. Stable cardiac silhouette. No acute osseous pathology. IMPRESSION: 1. No significant interval change in the appearance of the lungs compared to prior radiograph. 2. Support lines and tubes are in similar position. Electronically Signed   By: Anner Crete M.D.   On: 05/30/2019 08:11   Dg Chest Port 1 View  Result  Date: 06/04/2019 CLINICAL DATA:  ECMO. EXAM: PORTABLE CHEST 1 VIEW COMPARISON:  06/15/2019 FINDINGS: The patient remains intubated. The in scratched at the endotracheal tube is in stable position 2.5 cm above the carina. ECMO catheters are stable. Mediastinal drain is stable. Left IJ line is again noted. Diffuse interstitial edema is unchanged. IMPRESSION: 1. Stable appearance of interstitial edema. 2. Support apparatus, including ECMO catheters, are stable. Electronically Signed   By: San Morelle M.D.   On: 06/04/2019 06:09   Dg Chest Port 1 View  Result Date: 06/16/2019 CLINICAL DATA:  Status post cardiac surgery. EXAM: PORTABLE CHEST 1 VIEW COMPARISON:  June 03, 2019 FINDINGS: The endotracheal tube terminates just above the carina. There is a right PICC line and right-sided central venous catheter that appear grossly stable in positioning. A mediastinal drain is noted. There is an enteric feeding tube that is looped in the region of the GE junction and may be kinked. There is a secondary enteric tube that projects below the left hemidiaphragm. The patient's Impella device has been removed. ECMO catheters are now identified. There is worsening pulmonary edema. No convincing pneumothorax. Probable moderate-sized bilateral pleural effusions. IMPRESSION: 1. Endotracheal tube terminates just above the carina. Repositioning should be considered. 2. Lines and tubes as above.  ECMO catheters are now identified. 3. Worsening pulmonary edema. Electronically Signed   By: Constance Holster M.D.   On: 05/30/2019 18:26   Dg Chest Port 1 View  Result Date: 06/02/2019 CLINICAL DATA:  Patient status post cardiac surgery. EXAM: PORTABLE CHEST 1 VIEW COMPARISON:  Chest radiograph 06/01/2019 FINDINGS: Right upper extremity PICC line tip projects over the superior vena cava. Left IJ approach central venous catheter tip projects over the superior vena cava. ETT terminates in the mid trachea. Enteric tube courses  inferior to the diaphragm. Mediastinal drains project over the mediastinum. Impella device stable in position. ECMO cannula projects over the mediastinum. Stable cardiomegaly. Similar diffuse bilateral heterogeneous pulmonary  opacities. No pleural effusion or pneumothorax. IMPRESSION: Support apparatus as above. Similar diffuse bilateral airspace opacities which may represent edema. Electronically Signed   By: Lovey Newcomer M.D.   On: 06/02/2019 10:08   Dg Chest Port 1 View  Result Date: 06/01/2019 CLINICAL DATA:  68 year old male with ischemic heart disease, status post initiation of veno-arterial ECMO, Impella left ventricular assist EXAM: PORTABLE CHEST 1 VIEW COMPARISON:  Multiple prior, most recent 11 6 2020 FINDINGS: Cardiomediastinal silhouette unchanged in size and contour with surgical changes of median sternotomy. Mediastinal drain projects over the cardiac silhouette. Venous and arterial ECMO cannula again project over the mediastinum. Right axillary approach Impella, unchanged. Unchanged right IJ sheath. Unchanged right upper extremity PICC. Unchanged left IJ central venous catheter. Endotracheal tube appears to terminate 3.1 cm above the carina. Gastric tube projects over the mediastinum terminating in the left upper quadrant. Enteric feeding tube projects over the mediastinum terminating out of the field of view. Diffuse reticulonodular opacities of the lungs, similar to the prior with low lung volumes. No large pleural effusion or pneumothorax. IMPRESSION: Similar appearance of the chest x-ray to the most recent prior, with unchanged V-a ECMO cannula, mediastinal drain, Impella left ventricular assist device. Similar appearance of reticulonodular opacities throughout the lungs with no pneumothorax or large pleural effusion. Unchanged endotracheal tube terminating 3.1 cm above the carina. Unchanged gastric tube and enteric feeding tube. Unchanged right IJ sheath, left IJ central venous catheter,  right upper extremity PICC. Electronically Signed   By: Corrie Mckusick D.O.   On: 06/01/2019 10:39   Dg Chest Port 1 View  Result Date: 05/31/2019 CLINICAL DATA:  Intubation. EXAM: PORTABLE CHEST 1 VIEW COMPARISON:  05/30/2019. FINDINGS: Endotracheal tube, Swan-Ganz catheter, left IJ line, right PICC line, mediastinal drainage catheter appear in stable position. NG tube and feeding tube tips below left hemidiaphragm. Impella device in stable position. Prior median sternotomy. Overlying surgical tubing appear to be in stable position. Heart size stable. Diffuse bilateral interstitial prominence noted, increased from prior exam. Interstitial edema and/or pneumonitis could present this fashion. Tiny right pleural effusion cannot be excluded. No pneumothorax. IMPRESSION: 1. NG tube and feeding tube tips noted below left hemidiaphragm. Endotracheal tube, Swan-Ganz catheter, left IJ line, right PICC line, mediastinal drainage catheter appear stable position. No pneumothorax. 2. Impella device in stable position. Prior median sternotomy. Stable cardiomegaly. 3. Diffuse bilateral pulmonary interstitial prominence increased from prior exam. Interstitial edema and/or pneumonitis could present this fashion. Small right pleural effusion. Electronically Signed   By: Marcello Moores  Register   On: 05/31/2019 07:53   Dg Chest Port 1 View  Result Date: 05/30/2019 CLINICAL DATA:  Endotracheal tube placement EXAM: PORTABLE CHEST 1 VIEW COMPARISON:  May 29, 2019 FINDINGS: The Impella device is stable in positioning. The Swan-Ganz catheter tip projects over the main pulmonary artery. The enteric tube appears to extend below the left hemidiaphragm. Left-sided central venous catheter is stable in positioning. The endotracheal tube terminates just above the carina. The heart size is unchanged. The patient is status post prior median sternotomy. Upper lobe predominant hazy airspace opacities are again noted. There are bilateral pleural  effusions. The right-sided PICC line appears stable in positioning. IMPRESSION: 1. Lines and tubes as above. The endotracheal tube terminates just above the carina. Repositioning should be considered. 2. Persistent upper lobe predominant hazy airspace opacities favored to represent pulmonary edema. Electronically Signed   By: Constance Holster M.D.   On: 05/30/2019 07:00   Dg Chest [facility]  Result Date: 06/02/2019 CLINICAL DATA:  Post ECMO.  Hypertension. EXAM: PORTABLE CHEST 1 VIEW COMPARISON:  06/16/2019 FINDINGS: Previously seen support devices are stable. Presumed ECMO device now projects over the mediastinum. Bilateral airspace opacities are again noted, most pronounced in the upper lobes bilaterally. These are stable since prior study. No pneumothorax. No effusions. IMPRESSION: No significant change in the appearance of the lungs since prior study. Electronically Signed   By: Rolm Baptise M.D.   On: 05/28/2019 20:42   Dg Chest Port 1 View  Result Date: 06/19/2019 CLINICAL DATA:  Intubation, check support apparatus EXAM: PORTABLE CHEST 1 VIEW COMPARISON:  06/04/2019, 2 a.m. FINDINGS: No significant change in AP portable examination. Extensive support apparatus includes endotracheal tube, esophagogastric tube, left neck multi lumen vascular catheter, right neck pulmonary arterial catheter, right upper extremity approach Impella device, and right upper extremity PICC, all in unchanged position. Unchanged diffuse bilateral interstitial and heterogeneous pulmonary opacity. Defibrillator pad applied about the chest. IMPRESSION: 1. No significant change in AP portable examination. Extensive support apparatus includes endotracheal tube, esophagogastric tube, left neck multi lumen vascular catheter, right neck pulmonary arterial catheter, right upper extremity approach Impella device, and right upper extremity PICC, all in unchanged position. 2. Unchanged diffuse bilateral interstitial and heterogeneous  pulmonary opacity, consistent with edema, infection, and/or ARDS. Electronically Signed   By: Eddie Candle M.D.   On: 06/19/2019 14:42   Dg Chest Port 1 View  Result Date: 05/28/2019 CLINICAL DATA:  ET tube placement.  Post CPR. EXAM: PORTABLE CHEST 1 VIEW COMPARISON:  05/28/2019 FINDINGS: Endotracheal tube is 3 cm above the carina. Left dialysis catheter remains in place, unchanged. Impella device remains within the heart, unchanged. Swan-Ganz catheter is in the central right pulmonary artery, also stable. Heart is normal size. Bilateral upper lobe airspace opacities again noted, increasing slightly since prior study. No effusions. No acute bony abnormality. IMPRESSION: Bilateral upper lobe airspace opacities have worsened slightly since prior study. Endotracheal tube 3 cm above the carina. Electronically Signed   By: Rolm Baptise M.D.   On: 05/31/2019 02:15   Dg Chest Port 1 View  Result Date: 05/28/2019 CLINICAL DATA:  Shortness of breath.  CHF.  Impella device. EXAM: PORTABLE CHEST 1 VIEW COMPARISON:  05/27/2019. FINDINGS: Impella device, Swan-Ganz catheter, left IJ line stable position, right PICC line. Stable cardiomegaly. Diffuse bilateral pulmonary infiltrates/edema again noted. Similar findings noted on prior exam. No pleural effusion or pneumothorax. IMPRESSION: 1.  Lines in stable position.  Impella device in stable position. 2. Stable cardiomegaly. Diffuse bilateral pulmonary infiltrates/edema unchanged. Electronically Signed   By: Marcello Moores  Register   On: 05/28/2019 08:15   Dg Chest Port 1 View  Result Date: 05/27/2019 CLINICAL DATA:  Impella catheter placement EXAM: PORTABLE CHEST 1 VIEW COMPARISON:  05/25/2019 FINDINGS: The right upper extremity Impella projects over the expected region of the left ventricle. The Swan-Ganz catheter tip projects over the main pulmonary artery. The left-sided central venous catheter tip projects over the SVC/left brachiocephalic vein. Diffuse hazy bilateral  airspace opacities with prominent interstitial lung markings are again noted. The heart size remains enlarged. There is no pneumothorax. IMPRESSION: 1. Lines and tubes as above.  No pneumothorax. 2. Worsening pulmonary edema. Electronically Signed   By: Constance Holster M.D.   On: 05/27/2019 05:49   Dg Chest Port 1 View  Result Date: 05/25/2019 CLINICAL DATA:  Follow-up exam.  CHF. EXAM: PORTABLE CHEST 1 VIEW COMPARISON:  05/24/2019 and older exams. FINDINGS: Bilateral interstitial thickening is  similar to the previous day's study. No new lung abnormalities. No areas of consolidation. No convincing pleural effusion and no pneumothorax. Endotracheal tube, nasal/orogastric tube, right internal jugular Swan-Ganz catheter, left internal jugular central venous line and right PICC are stable. Stable left ventricular assist device. IMPRESSION: 1. Bilateral interstitial thickening consistent with mild residual interstitial edema. This is improved compared to 05/23/2019. No new lung abnormalities. 2. Stable support apparatus. Electronically Signed   By: Lajean Manes M.D.   On: 05/25/2019 10:34   Dg Chest Port 1 View  Result Date: 05/24/2019 CLINICAL DATA:  Endotracheal intubation. EXAM: PORTABLE CHEST 1 VIEW COMPARISON:  May 23, 2019. FINDINGS: The heart size and mediastinal contours are within normal limits. Endotracheal and nasogastric tubes are unchanged. Right-sided PICC line is unchanged. Stable left internal jugular catheter. Stable Swan-Ganz catheter in the right internal jugular vein with tip directed toward right pulmonary artery. No pneumothorax or pleural effusion is noted. Stable left ventricular assist device is noted. No acute pulmonary disease is noted. The visualized skeletal structures are unremarkable. IMPRESSION: Stable support apparatus as described above. No acute cardiopulmonary abnormality seen. Electronically Signed   By: Marijo Conception M.D.   On: 05/24/2019 12:10   Dg Chest Port 1  View  Result Date: 05/23/2019 CLINICAL DATA:  Central line placement. EXAM: PORTABLE CHEST 1 VIEW COMPARISON:  Earlier film, same date. FINDINGS: The endotracheal tube, Swan-Ganz catheter, right PICC line, NG tube and Impella device are stable since the earlier film. New left IJ central venous catheter tip is near the brachiocephalic SVC junction. No complicating features. Improved aeration since the earlier film may suggest improving edema and atelectasis. IMPRESSION: 1. Support apparatus in good position without complicating features. 2. New left IJ catheter tip is in the mid SVC. 3. Improved lung aeration since earlier film today. Electronically Signed   By: Marijo Sanes M.D.   On: 05/23/2019 12:05   Dg Chest Port 1 View  Result Date: 05/23/2019 CLINICAL DATA:  Respiratory failure. EXAM: PORTABLE CHEST 1 VIEW COMPARISON:  Chest x-ray 05/22/2019 FINDINGS: The endotracheal tube is 4.4 cm above the carina. Right IJ Swan-Ganz catheter tip is in the proximal right pulmonary artery. Impella device and good position without complicating features. Right PICC line is stable. NG tube coursing down the esophagus and into the stomach. Stable appearance of the heart and mediastinum. Stable calcification of the thoracic aorta. Increasing density at the left lung base could be progressive atelectasis or infiltrate. Underlying edema and atelectasis appears stable. IMPRESSION: 1. Support apparatus in good position without complicating features. 2. Increasing density at the left lung base could be progressive atelectasis or infiltrate. 3. Stable underlying edema and atelectasis. Electronically Signed   By: Marijo Sanes M.D.   On: 05/23/2019 07:52   Dg Chest Port 1 View  Result Date: 05/22/2019 CLINICAL DATA:  Status post bronchoscopy. EXAM: PORTABLE CHEST 1 VIEW COMPARISON:  Same day. FINDINGS: Stable cardiomediastinal silhouette. No pneumothorax or pleural effusion is noted. Endotracheal and nasogastric tubes are  unchanged in position. Left ventricular assist device is unchanged. Right internal jugular Swan-Ganz catheter is unchanged. No acute pulmonary disease is noted. Bony thorax is unremarkable. IMPRESSION: Stable support apparatus. No acute cardiopulmonary abnormality seen. Electronically Signed   By: Marijo Conception M.D.   On: 05/22/2019 11:36   Dg Chest Port 1 View  Result Date: 05/22/2019 CLINICAL DATA:  Respiratory distress EXAM: PORTABLE CHEST 1 VIEW COMPARISON:  05/21/2019 FINDINGS: Cardiac shadow is stable. Endotracheal tube, gastric catheter  and Swan-Ganz catheter are noted in satisfactory position. Right-sided PICC line and Impella catheter are seen and stable. The lungs are well aerated bilaterally. No focal infiltrate or sizable effusion is seen. No bony abnormality is seen. IMPRESSION: Tubes and lines as described above stable from the previous exam. No acute abnormality noted. Electronically Signed   By: Inez Catalina M.D.   On: 05/22/2019 01:33   Dg Chest Port 1 View  Result Date: 05/21/2019 CLINICAL DATA:  Respiratory failure. EXAM: PORTABLE CHEST 1 VIEW COMPARISON:  May 20, 2019. FINDINGS: Endotracheal nasogastric tubes are unchanged in position. Left ventricular assist device is unchanged in position. Right internal jugular Swan-Ganz catheter is noted with tip directed into right pulmonary artery. No pneumothorax or pleural effusion is noted. No acute pulmonary disease is noted. Bony thorax is unremarkable. IMPRESSION: Stable support apparatus. No acute cardiopulmonary abnormality seen. Electronically Signed   By: Marijo Conception M.D.   On: 05/21/2019 07:25   Dg Chest Port 1 View  Result Date: 05/20/2019 CLINICAL DATA:  Endotracheal tube placement EXAM: PORTABLE CHEST 1 VIEW COMPARISON:  05/20/2019 FINDINGS: Endotracheal tube 6.2 cm above the carina. NG tube extends below the hemidiaphragms into the stomach with the tip not visualized. Swan-Ganz catheter directed into the right  pulmonary artery. Impella ventricular assist device unchanged. Stable heart size. Improvement in the central vascular congestion. No focal airspace process, collapse or consolidation. Negative for edema, large effusion or pneumothorax. IMPRESSION: Stable support apparatus. Improved vascular congestion/mild edema pattern. Electronically Signed   By: Jerilynn Mages.  Shick M.D.   On: 05/20/2019 20:56   Dg Chest Port 1 View  Result Date: 05/20/2019 CLINICAL DATA:  Endotracheal tube placement, respiratory failure. EXAM: PORTABLE CHEST 1 VIEW COMPARISON:  05/19/2019 and 05/04/2019 FINDINGS: Endotracheal tube tip 4.8 cm above the carina. Enteric tube courses into the stomach and off the film as tip is not visualized. Right IJ Swan-Ganz catheter unchanged with tip over the distal right main pulmonary artery. Right-sided PICC line unchanged with tip over the SVC. Impella ventricular assist device unchanged. Lungs are adequately inflated with subtle stable hazy density over the right upper lung and left infrahilar region. No effusion or pneumothorax. Cardiomediastinal silhouette and remainder of the exam is unchanged. IMPRESSION: Subtle stable hazy density over the right upper lung and left infrahilar region. Tubes and lines as described. Electronically Signed   By: Marin Olp M.D.   On: 05/20/2019 07:44   Dg Chest Port 1 View  Result Date: 05/19/2019 CLINICAL DATA:  Endotracheal tube EXAM: PORTABLE CHEST 1 VIEW COMPARISON:  Chest radiograph from 05/12/2019 FINDINGS: A right upper extremity peripherally inserted central venous catheter tip overlies the superior vena cava. An Impella left ventricular assist device is unchanged in position. An endotracheal tube terminates in the midthoracic trachea. An enteric tube terminates in the stomach. The heart size is normal. Vascular calcifications are seen in the aortic arch. The lungs are clear. There is no pleural effusion or pneumothorax. IMPRESSION: 1. No significant interval  change in the appearance of the chest. Electronically Signed   By: Zerita Boers M.D.   On: 05/19/2019 15:24   Dg Chest Port 1 View  Result Date: 05/19/2019 CLINICAL DATA:  Central line placement EXAM: PORTABLE CHEST 1 VIEW COMPARISON:  Chest radiograph from the same day FINDINGS: There has been interval placement of a right internal jugular Swan-Ganz catheter with tip overlying the right pulmonary artery. A right upper extremity peripherally inserted central venous catheter tip overlies the superior vena cava.  An Impella left ventricular assist device is unchanged in position. An endotracheal tube terminates in the midthoracic trachea. An enteric tube terminates in the stomach. The heart size is normal. Vascular calcifications are seen in the aortic arch. The lungs are clear. There is no pleural effusion or pneumothorax. IMPRESSION: 1. Swan-Ganz catheter tip overlies the right pulmonary artery. 2. No pneumothorax. 3. No acute findings in the chest. Electronically Signed   By: Zerita Boers M.D.   On: 05/19/2019 15:23   Dg Chest Port 1 View  Result Date: 05/05/2019 CLINICAL DATA:  Respiratory failure EXAM: PORTABLE CHEST 1 VIEW COMPARISON:  Chest radiograph from one day prior. FINDINGS: Inferior approach Impella device terminates over the left ventricular apex. Endotracheal tube tip is 4.0 cm above the carina. Enteric tube enters stomach with the tip not seen on this image. Right PICC terminates over lower third of the SVC. Inferior approach Swan-Ganz catheter terminates over right infrahilar region. Stable cardiomediastinal silhouette with normal heart size. No pneumothorax. No pleural effusion. Lungs appear clear, with no acute consolidative airspace disease and no pulmonary edema. IMPRESSION: 1. Well-positioned support structures.  No pneumothorax. 2. No pulmonary edema. Electronically Signed   By: Ilona Sorrel M.D.   On: 05/02/2019 12:27   Dg Chest Port 1 View  Result Date: 05/25/2019 CLINICAL  DATA:  Respiratory failure EXAM: PORTABLE CHEST 1 VIEW COMPARISON:  05/02/2019 FINDINGS: Endotracheal tube, gastric catheter and right-sided PICC line are noted in satisfactory position. Cardiac shadow is mildly enlarged but stable. Aortic calcifications are again seen. Vascular congestion is noted patchy parenchymal opacities bilaterally likely related to edema. Left retrocardiac density remains. No bony abnormality is seen. IMPRESSION: Vascular congestion and patchy opacities likely related to edema. Left retrocardiac atelectasis. Electronically Signed   By: Inez Catalina M.D.   On: 05/05/2019 07:37   Dg Chest Portable 1 View  Result Date: 05/20/2019 CLINICAL DATA:  Intubation. Image 2 after pulled back. EXAM: PORTABLE CHEST 1 VIEW COMPARISON:  04/29/2019 FINDINGS: Endotracheal tube has been placed. On the initial image the tube is 3.6 centimeters above the carina. On the second image, tube is approximately 5.4 centimeters above the carina. Nasogastric tube is identified, tip beyond the level of the proximal stomach. Heart size is normal. There are hazy infiltrates throughout the lungs bilaterally, slightly increased since the prior study. Opacity at the LEFT lung base is more confluent. IMPRESSION: 1. Interval placement of endotracheal tube. 2. Increased bilateral infiltrates. 3. More confluent opacity at the LEFT lung base. Electronically Signed   By: Nolon Nations M.D.   On: 05/23/2019 18:57   Dg Chest Port 1 View  Result Date: 05/24/2019 CLINICAL DATA:  Chest pain. EXAM: PORTABLE CHEST 1 VIEW COMPARISON:  September 18, 2018. FINDINGS: The heart size and mediastinal contours are within normal limits. No pneumothorax or pleural effusion is noted. Multiple small ill-defined interstitial densities are noted throughout both lungs which may represent focal inflammation. The visualized skeletal structures are unremarkable. IMPRESSION: Multiple small ill-defined interstitial densities are noted throughout  both lungs which potentially may represent multifocal inflammation or possibly edema. Electronically Signed   By: Marijo Conception M.D.   On: 05/15/2019 16:15   Dg Abd Portable 1v  Result Date: 05/28/2019 CLINICAL DATA:  Multiple instances of vomiting. EXAM: PORTABLE ABDOMEN - 1 VIEW COMPARISON:  Chest x-ray 05/28/2019. FINDINGS: Slightly prominent air-filled loops of small and large bowel noted. Adynamic ileus could present in this fashion. Follow-up exam suggested demonstrate resolution and to exclude bowel obstruction.  No free air. Degenerative changes lumbar spine and both hips. Aortoiliac atherosclerotic vascular disease. IMPRESSION: Slightly prominent air-filled loops of small large bowel noted. Adynamic ileus could present in this fashion. Follow-up exam suggested to demonstrate resolution and to exclude bowel obstruction. Electronically Signed   By: Marcello Moores  Register   On: 05/28/2019 14:08   Dg C-arm 1-60 Min  Result Date: 04/30/2019 CLINICAL DATA:  Impella insertion EXAM: DG C-ARM 1-60 MIN; CHEST  1 VIEW COMPARISON:  Same-day chest radiographs FINDINGS: Intraoperative fluoroscopy demonstrates the presence of a transesophageal echo probe and placement of an Impella left ventricular assist device in expected positioning. Cardiomediastinal contours are similar to comparison exams. Evaluation of the lungs is limited these collimated fluoroscopic views. IMPRESSION: Fluoroscopy demonstrates presence of a transesophageal echo probe and Impella device in expected position. Electronically Signed   By: Lovena Le M.D.   On: 05/11/2019 22:25   Vas [place] Upper Extremity Venous Duplex  Result Date: 05/22/2019 UPPER VENOUS STUDY  Indications: Edema Other Indications: Impella, PICC line, IV, all in right chest/arm. Limitations: Line and bandages. Comparison Study: No prior study on file for comparison. Performing Technologist: Sharion Dove RVS  Examination Guidelines: A complete evaluation includes B-mode  imaging, spectral Doppler, color Doppler, and power Doppler as needed of all accessible portions of each vessel. Bilateral testing is considered an integral part of a complete examination. Limited examinations for reoccurring indications may be performed as noted.  Right Findings: +----------+------------+---------+-----------+----------+---------------------+ RIGHT     CompressiblePhasicitySpontaneousProperties       Summary        +----------+------------+---------+-----------+----------+---------------------+ IJV                                                    Not visualized     +----------+------------+---------+-----------+----------+---------------------+ Subclavian               Yes       Yes               patent by color and                                                             Doppler        +----------+------------+---------+-----------+----------+---------------------+ Axillary                                               Not visualized     +----------+------------+---------+-----------+----------+---------------------+ Brachial      Full       Yes       Yes                                    +----------+------------+---------+-----------+----------+---------------------+ Radial        Full                                                        +----------+------------+---------+-----------+----------+---------------------+  Ulnar                                                  patent by color    +----------+------------+---------+-----------+----------+---------------------+ Cephalic      Full                                                        +----------+------------+---------+-----------+----------+---------------------+ Basilic       Full                                                        +----------+------------+---------+-----------+----------+---------------------+  Left Findings:  +----------+------------+---------+-----------+----------+-------+ LEFT      CompressiblePhasicitySpontaneousPropertiesSummary +----------+------------+---------+-----------+----------+-------+ Subclavian               Yes       Yes                      +----------+------------+---------+-----------+----------+-------+  Summary:  Right: No evidence of deep vein thrombosis in the upper extremity. However, unable to visualize the IJV, axillary. No evidence of superficial vein thrombosis in the upper extremity. However, unable to visualize the IJV, axillary. No evidence of thrombosis in the . However, unable to visualize the IJV, axillary.  Left: No evidence of thrombosis in the subclavian.  *See table(s) above for measurements and observations.  Diagnosing physician: Deitra Mayo MD Electronically signed by Deitra Mayo MD on 05/22/2019 at 6:24:27 AM.    Final    [place] Ekg Site Rite  Result Date: 05/22/2019 If Site Rite image not attached, placement could not be confirmed due to current cardiac rhythm.   Microbiology Recent Results (from the past 240 hour(s))  Culture, bal-quantitative     Status: Abnormal   Collection Time: 05/31/2019 10:43 AM   Specimen: Bronchoalveolar Lavage; Respiratory  Result Value Ref Range Status   Specimen Description BRONCHIAL ALVEOLAR LAVAGE RIGHT  Final   Special Requests Immunocompromised  Final   Gram Stain   Final    MODERATE WBC PRESENT,BOTH PMN AND MONONUCLEAR MODERATE BUDDING YEAST SEEN Performed at [facility], [address], [place], [place] [address]    Culture 80,000 COLONIES/mL CANDIDA ALBICANS (A)  Final   Report Status 06/24/2019 FINAL  Final  Fungus Culture With Stain     Status: None (Preliminary result)   Collection Time: 05/31/2019 10:43 AM  Result Value Ref Range Status   Fungus Stain Final report  Final    Comment: (NOTE) Performed At: [facility] [place], [place] [id number] Rush Farmer MD OF:[id number]    Fungus (Mycology) Culture PENDING  Incomplete   Fungal Source BRONCHIAL ALVEOLAR LAVAGE  Final    Comment: RIGHT Performed at [facility], [place] [address], [place], [place] [address]   Fungus Culture Result     Status: None   Collection Time: 06/23/2019 10:43 AM  Result Value Ref Range Status   Result 1 Comment  Final    Comment: (NOTE) KOH/Calcofluor preparation:  no fungus observed. Performed  At: [facility] [place], [place] [id number] Rush Farmer MD KA:[id number]     Lab Basic Metabolic Panel: Recent Labs  Lab 06/12/2019 0418  05/26/2019 1640 06/04/2019 1745  06/07/19 0421  06/07/19 1335  06/07/19 1754  06/07/19 2326 06-27-2019 0257 Jun 27, 2019 0418 2019-06-27 0616 Jun 27, 2019 0706  NA  --    < > 145  --    < > 149*   < > 146*   < > 145   < > 144 142 143 143 143  K  --    < > 5.0  --    < > 4.4   < > 4.6   < > 4.7   < > 4.6 4.6 4.6 4.7 4.7  CL  --    < > 106  --   --  109  --  108  --  104  --   --  106  --   --   --   CO2  --    < > 26  --   --  25  --  25  --  24  --   --  24  --   --   --   GLUCOSE  --    < > 116*  --   --  94  --  87  --  94  --   --  77  --   --   --   BUN  --    < > 48*  --   --  65*  --  57*  --  53*  --   --  39*  --   --   --   CREATININE  --    < > 1.67*  --   --  1.96*  --  1.75*  --  1.64*  --   --  1.29*  --   --   --   CALCIUM  --    < > 8.2*  --   --  7.9*  --  7.6*  --  7.8*  --   --  7.6*  --   --   --   MG 3.3*  --  2.9*  --   --  2.8*  --   --   --   --   --   --  2.5*  --   --   --   PHOS  --   --   --  7.1*  --  6.1*  --  5.2*  --   --   --   --  4.3  --   --   --    < > = values in this interval not displayed.   Liver Function Tests: Recent Labs  Lab 06/10/2019 0506 06/23/2019 1640 06/07/19 0421 06/07/19 1335 06/07/19 1754 27-Jun-2019 0257  AST 116* 1,428* 1,296*  --  1,083* 1,652*  ALT 32 454* 606*  --  713* 969*  ALKPHOS 120 62 57  --  64 71  BILITOT 10.0* 13.9* 20.1*  --  22.1*  24.7*  PROT 5.2* 3.5* 3.5*  --  3.6* 4.2*  ALBUMIN 3.0* 2.2* 2.3* 2.3* 2.2* 2.6*   No results for input(s): LIPASE, AMYLASE in the last 168 hours. No results for input(s): AMMONIA in the last 168 hours. CBC: Recent Labs  Lab 06/01/2019 1811  06/18/2019 1640  06/07/19 0421  06/07/19 1754  06/07/19 2321 06/07/19 2326 06-27-2019 0257 06/27/19 0418 06-27-2019 0355 2019-06-27 9741  WBC 17.6*   < > 7.7  --  7.9  --  9.2  --  10.2  --  10.1  --   --   --   NEUTROABS 15.1*  --   --   --   --   --   --   --   --   --   --   --   --   --   HGB 13.6   < > 12.2*   < > 9.8*   < > 8.5*   < > 8.6* 7.5* 7.7* 6.5* 7.1* 7.8*  HCT 40.5   < > 34.8*   < > 27.9*   < > 24.3*   < > 24.3* 22.0* 22.0* 19.0* 21.0* 23.0*  MCV 88.8   < > 84.7  --  83.5  --  85.6  --  85.3  --  86.3  --   --   --   PLT 79*   < > 43*  --  20*  --  14*  --  31*  --  31*  --   --   --    < > = values in this interval not displayed.   Cardiac Enzymes: No results for input(s): CKTOTAL, CKMB, CKMBINDEX, TROPONINI in the last 168 hours. Sepsis Labs: Recent Labs  Lab 06/14/2019 1640 06/07/19 0421 06/07/19 1754 06/07/19 2321 2019/07/01 0257  WBC 7.7 7.9 9.2 10.2 10.1  LATICACIDVEN 3.7* 1.7 1.8  --  1.2    Procedures/Operations  Left heart catheterization, placement of Impella CP device Insertion of Impella 5.0 LVAD Cannulation for VA ecmo CABG x 4 and aortic valve replacement Cannulation for VA ecmo   Greenly Rarick Z Alberto Schoch 06/12/2019, 6:02 PM

## 2019-06-25 NOTE — Progress Notes (Signed)
2 Days Post-Op Procedure(s) (LRB): EXPLORATION OF CHEST (N/A) TRANSESOPHAGEAL ECHOCARDIOGRAM (TEE) (N/A) Cannulation For Ecmo (Extracorporeal Membrane Oxygenation) (N/A) Indocyanine Green Fluorescence Imaging (Icg) (N/A) Subjective:  Remains on full VA ECMO support. Flow has been fairly stable. Remains anuric on CRRT. Not able to remove much volume. Remains on vent. Progressive liver failure with rising transaminases, total bili and INR. Echo this am reviewed and shows a large amount of thrombus in LV.  Objective: Vital signs in last 24 hours: Temp:  [97.9 F (36.6 C)-99.5 F (37.5 C)] 97.9 F (36.6 C) (11/14 0639) Pulse Rate:  [90] 90 (11/14 0639) Cardiac Rhythm: (P) A-V Sequential paced (11/14 0800) Resp:  [0-25] 0 (11/14 0700) SpO2:  [62 %-100 %] 73 % (11/14 0030) Arterial Line BP: (55-255)/(41-247) 55/41 (11/14 0345) FiO2 (%):  [30 %] 30 % (11/14 0851) Weight:  [83.8 kg] 83.8 kg (11/14 0446)  Hemodynamic parameters for last 24 hours: CVP:  [8 mmHg-19 mmHg] 19 mmHg  Intake/Output from previous day: 11/13 0701 - 11/14 0700 In: 4503.2 [I.V.:3133.9; Blood:619; IV Piggyback:750.3] Out: 6017 [Stool:50; Chest Tube:1875] Intake/Output this shift: Total I/O In: 556.4 [P.O.:110; I.V.:254.4; Blood:192] Out: 812 [Other:742; Chest Tube:70]  General appearance: anasarca, jaundiced Neurologic: unable to assess Heart: regular rate and rhythm Lungs: diminished breath sounds throughout Abdomen: distended, no BS Extremities: edema diffuse, warm Wound: chest dressing intact  Lab Results: Recent Labs    06/07/19 2321  06/24/2019 0257  06/19/2019 0616 06/23/2019 0706  WBC 10.2  --  10.1  --   --   --   HGB 8.6*   < > 7.7*   < > 7.1* 7.8*  HCT 24.3*   < > 22.0*   < > 21.0* 23.0*  PLT 31*  --  31*  --   --   --    < > = values in this interval not displayed.   BMET:  Recent Labs    06/07/19 1754  06/21/2019 0257  05/30/2019 0616 05/26/2019 0706  NA 145   < > 142   < > 143 143  K  4.7   < > 4.6   < > 4.7 4.7  CL 104  --  106  --   --   --   CO2 24  --  24  --   --   --   GLUCOSE 94  --  77  --   --   --   BUN 53*  --  39*  --   --   --   CREATININE 1.64*  --  1.29*  --   --   --   CALCIUM 7.8*  --  7.6*  --   --   --    < > = values in this interval not displayed.    PT/INR:  Recent Labs    06/07/2019 0257  LABPROT 52.3*  INR 6.0*   ABG    Component Value Date/Time   PHART 7.456 (H) 06/16/2019 0706   HCO3 29.7 (H) 06/02/2019 0706   TCO2 31 06/22/2019 0706   ACIDBASEDEF 2.0 05/30/2019 1116   O2SAT 100.0 06/09/2019 0706   CBG (last 3)  Recent Labs    06/01/2019 1404 06/13/2019 2000 06/24/2019 2341  GLUCAP 89 91 92    Assessment/Plan:  Multisystem organ failure on ECMO support with worsening liver failure, large amount of LV thrombus on echo this am. I don't think this is a survivable situation and he is not a candidate for any further surgical  intervention. I agree with palliative care discussions and withdrawal of ECMO support.  LOS: 23 days    Gaye Pollack 05/27/2019

## 2019-06-25 NOTE — Progress Notes (Signed)
Just spoken to MD Ogan ([id number]) regarding the mechanical ventilatory changes that were made. Refer to previous RRT note. MD agreed. Orders are now changed to support it.

## 2019-06-25 NOTE — Progress Notes (Signed)
Pharmacy Anti-Coagulation Consult Note:  Pharmacy Consult for Heparin to bivalirudin Indication for heparin: ECMO  No Known Allergies  Patient Measurements: Height: 5\' 7"  (170.2 cm)(measured x3) Weight: 195 lb 12.3 oz (88.8 kg)(Weighed with PreValon Boots; unable to take off d/t unstable) IBW/kg (Calculated) : 66.1  Vital Signs: Temp: 99.5 F (37.5 C) (11/13 2034) Temp Source: Core (11/13 1736) Pulse Rate: 90 (11/13 2034)  Labs: Recent Labs    06/07/19 0421  06/07/19 1335  06/07/19 1754 06/07/19 1832 06/07/19 2321 06/07/19 2326  HGB 9.8*   < >  --    < > 8.5* 7.5* 8.6* 7.5*  HCT 27.9*   < >  --    < > 24.3* 22.0* 24.3* 22.0*  PLT 20*  --   --   --  14*  --  31*  --   APTT 46*  --  81*  --  84*  --  79*  --   LABPROT 36.1*  --  57.8*  --  54.7*  --   --   --   INR 3.7*  --  6.8*  --  6.3*  --   --   --   CREATININE 1.96*  --  1.75*  --  1.64*  --   --   --    < > = values in this interval not displayed.    Estimated Creatinine Clearance: 45.2 mL/min (A) (by C-G formula based on SCr of 1.64 mg/dL (H)).  Assessment: 68 yo M admitted with shock and concern for ACS now s/p Impella CP placement in cath lab. Impella CP swapped out for 5.0 d/t hemolysis. Underwent VA ECMO with goal of CABG for revascularization. ECMO initiated 11/4 pm and pt was started on heparin. This has intermittently been held with bleeding, pt most recently on 1100 units/h systemic only after Impella pulled 11/9 with aPTTs ~50s.  Pt underwent CABG + AVR on 11/11 then began decompensating requiring re-cannulation of ECMO on 11/12. Pt has remained extremely thrombocytopenic, HIT panel previously negative, resent per MD on 11/12 (pending). Heparin was held most of 11/12 with concern pt was too unstable to survive and GOC discussion ongoing, now is more stable so care is continued.  Pt noted to have LV clot 11/13 am, likely due to LV vent clotting off. Pharmacy asked to resume anticoagulation. Dr. [name], [place] Gum, and [place] all discussed. With ongoing thrombocytopenia and concern for HIT, will continue on bivalirudin while HIT is pending. Discussed risks with bivalirudin related to poor kidney function and and inability to readily reverse DTI - team wishes to proceed. Will begin very slowly and all dose adjustments will need to be communicated via Dr. [name].   APTT remains slightly elevated at 79 sec continue the course as bivalirudin is infusing at a very low rate of 1.8 ml/hr.  Question the accuracy of aPTT in setting of liver dysfunction.  Goal of Therapy:  Goal aPTT 50-70 seconds Monitor platelets by anticoagulation protocol: Yes   Plan:  Continue bivalirudin to 0.01 mg/kg/hr Check aPTT in 4hrs  Raymond Chavez, PharmD, [facility] Clinical Pharmacist Please see AMION for all Pharmacists' Contact Phone Numbers June 17, 2019, 12:04 AM

## 2019-06-25 NOTE — Progress Notes (Signed)
[facility] NEPHROLOGY PROGRESS NOTE  Assessment/ Plan: Pt is a 68 y.o. yo male with cardiogenic shock who underwent placement of Impella on 10/24, on milrinone and Levophed, cardiac cath with three-vessel disease and echocardiogram revealing EF of 20%, respiratory failure, consulted for AKI.  Baseline creatinine level around 1.3.  #Anuric acute kidney injury on CKD: due to cardiorenal syndrome and contrast nephropathy, complicated by cardiac arrest on 11/3. CRRT since 10/30.  Currently tolerating CRRT.  Continue all 4K solution at current flow settings.  Goal net even with AHF & TCTS assisting with fluid management., sig losses from chest tube. No clotting.  On bivalidrudin given TCP  #Cardiac arrest/cardiogenic shock: EF 20%, on ECMO.  Had  cardiac arrest on 11/3.   Heart failure team is following.  CABG and AVF 11/11, remains ECMO dependent.   #Acute respiratory failure with hypoxia: Due to CHF and pneumonia. On mechanical ventilation and ECMO. CCM following  #CAD: NSTEMI, CABG with severe three-vessel disease. As above.   # Severe TCP  #Hypophosphatemia, hypocalcemia: stable,     Subjective:   LFTs worsening  K 4.5, P 4.3  on ECMO  Paralyzed  CRRT running well  Objective Vital signs in last 24 hours: Vitals:   06/25/2019 0630 06-25-19 0639 2019-06-25 0645 June 25, 2019 0700  BP:      Pulse:  90    Resp: (!) 0 15 (!) 0 (!) 0  Temp:  97.9 F (36.6 C)    TempSrc:      SpO2:      Weight:      Height:       Weight change: -5 kg  Intake/Output Summary (Last 24 hours) at 2019-06-25 0931 Last data filed at 2019-06-25 0900 Gross per 24 hour  Intake 4661.15 ml  Output 6588 ml  Net -1926.85 ml       Labs: Basic Metabolic Panel: Recent Labs  Lab 06/07/19 0421  06/07/19 1335  06/07/19 1754  2019-06-25 0257 06/25/19 0418 2019-06-25 0616 June 25, 2019 0706  NA 149*   < > 146*   < > 145   < > 142 143 143 143  K 4.4   < > 4.6   < > 4.7   < > 4.6 4.6 4.7 4.7  CL  109  --  108  --  104  --  106  --   --   --   CO2 25  --  25  --  24  --  24  --   --   --   GLUCOSE 94  --  87  --  94  --  77  --   --   --   BUN 65*  --  57*  --  53*  --  39*  --   --   --   CREATININE 1.96*  --  1.75*  --  1.64*  --  1.29*  --   --   --   CALCIUM 7.9*  --  7.6*  --  7.8*  --  7.6*  --   --   --   PHOS 6.1*  --  5.2*  --   --   --  4.3  --   --   --    < > = values in this interval not displayed.   Liver Function Tests: Recent Labs  Lab 06/07/19 0421 06/07/19 1335 06/07/19 1754 2019/06/25 0257  AST 1,296*  --  1,083* 1,652*  ALT 606*  --  713*  969*  ALKPHOS 57  --  64 71  BILITOT 20.1*  --  22.1* 24.7*  PROT 3.5*  --  3.6* 4.2*  ALBUMIN 2.3* 2.3* 2.2* 2.6*   No results for input(s): LIPASE, AMYLASE in the last 168 hours. No results for input(s): AMMONIA in the last 168 hours. CBC: Recent Labs  Lab 06/02/19 1150  05/30/2019 1811  06/21/2019 1640  06/07/19 0421  06/07/19 1754  06/07/19 2321  06-25-2019 0257 June 25, 2019 0418 06-25-2019 0616 2019-06-25 0706  WBC 14.9*   < > 17.6*   < > 7.7  --  7.9  --  9.2  --  10.2  --  10.1  --   --   --   NEUTROABS 13.6*  --  15.1*  --   --   --   --   --   --   --   --   --   --   --   --   --   HGB 8.1*   < > 13.6   < > 12.2*   < > 9.8*   < > 8.5*   < > 8.6*   < > 7.7* 6.5* 7.1* 7.8*  HCT 24.1*   < > 40.5   < > 34.8*   < > 27.9*   < > 24.3*   < > 24.3*   < > 22.0* 19.0* 21.0* 23.0*  MCV 93.1   < > 88.8   < > 84.7  --  83.5  --  85.6  --  85.3  --  86.3  --   --   --   PLT 32*   < > 79*   < > 43*  --  20*  --  14*  --  31*  --  31*  --   --   --    < > = values in this interval not displayed.   Cardiac Enzymes: No results for input(s): CKTOTAL, CKMB, CKMBINDEX, TROPONINI in the last 168 hours. CBG: Recent Labs  Lab 06/16/2019 0425 06/18/2019 1212 05/30/2019 1404 06/15/2019 2000 06/09/2019 2341  GLUCAP 117* 90 89 91 92    Iron Studies: No results for input(s): IRON, TIBC, TRANSFERRIN, FERRITIN in the last 72  hours. Studies/Results: Dg Chest Port 1 View  Result Date: 06/25/2019 CLINICAL DATA:  Intubation.  Status post CABG. EXAM: PORTABLE CHEST 1 VIEW COMPARISON:  06/07/2019 FINDINGS: ETT tip in stable position above the carina. The nasogastric tube tip is above the GE junction. The side port is approximately 2 cm above the carina. There is a right internal jugular Swan-Ganz catheter with tip at the expected location of the right ventricular outflow tract. Left IJ catheter tip projects over the left brachiocephalic vein. ECMO device is stable. Left atrial clip and aortic valve prosthesis are noted. Heart size is normal. Diffuse bilateral interstitial and airspace opacities are unchanged from previous exam. There are bilateral chest tubes in place without pneumothorax. IMPRESSION: 1. Stable position of support apparatus. Note: The nasogastric tube may be under advanced. The tip appears to be above the level of the GE junction. 2. No change in aeration to the lungs compared with previous exam. Electronically Signed   By: Kerby Moors M.D.   On: 06-25-2019 04:14   Dg Chest Port 1 View  Result Date: 06/07/2019 CLINICAL DATA:  Intubation.  Chest tube. EXAM: PORTABLE CHEST 1 VIEW COMPARISON:  Left 06/2019. FINDINGS: Endotracheal tube, NG tube, left IJ line, Swan-Ganz catheter, mediastinal drainage tubes,  bilateral chest tubes, ECMO device in stable position. No pneumothorax. Prior cardiac valve replacement. Left atrial appendage clip noted stable position. Heart size stable. Diffuse bilateral pulmonary infiltrates/edema again noted. No prominent pleural effusion. IMPRESSION: 1. Lines and tubes including bilateral chest tubes in stable position. No pneumothorax. 2. Prior cardiac valve replacement. Left atrial appendage clip in stable position. Heart size stable. 3. Diffuse bilateral pulmonary infiltrates/edema again noted without interim change. Electronically Signed   By: Marcello Moores  Register   On: 06/07/2019 07:29    Dg Chest Port 1 View  Result Date: 06/22/2019 CLINICAL DATA:  Hypotension post ECMO.  Recent CABG. EXAM: PORTABLE CHEST 1 VIEW COMPARISON:  Chest x-ray from same day. FINDINGS: Unchanged endotracheal and enteric tubes. Unchanged bilateral chest tubes and mediastinal drain. Unchanged right internal jugular Swan-Ganz catheter with tip in the main pulmonary artery. Unchanged right upper extremity PICC line and bilateral internal jugular central venous catheters. Unchanged ECMO catheters. Normal heart size. Prior AVR and left atrial appendage clipping. Hazy airspace disease both lungs, greater on the left, similar to prior study. No pleural effusion or pneumothorax. No acute osseous abnormality. IMPRESSION: 1. Stable support tubes and lines.  No pneumothorax. 2. Unchanged diffuse pulmonary edema. Electronically Signed   By: Titus Dubin M.D.   On: 06/16/2019 14:01   Dg Chest Port 1 View  Result Date: 06/07/2019 CLINICAL DATA:  Status post open heart surgery. EXAM: PORTABLE CHEST 1 VIEW COMPARISON:  Same day. FINDINGS: Stable position of endotracheal, nasogastric tubes, bilateral jugular catheters and right-sided PICC line. Stable bilateral chest tubes are noted without pneumothorax. Stable bilateral lung opacities are noted. Bony thorax is unremarkable. IMPRESSION: Stable support apparatus. Stable bilateral lung opacities. Stable bilateral chest tubes without pneumothorax. Electronically Signed   By: Marijo Conception M.D.   On: 06/16/2019 13:12    Medications: Infusions: .  prismasol BGK 4/2.5 400 mL/hr at 06/07/19 2136  .  prismasol BGK 4/2.5 300 mL/hr at June 14, 2019 0245  . sodium chloride    . sodium chloride    . sodium chloride    . bivalirudin (ANGIOMAX) infusion 0.5 mg/mL (Non-ACS indications) 0.01 mg/kg/hr (Jun 14, 2019 0900)  . cisatracurium (NIMBEX) infusion 2 mcg/kg/min (2019/06/14 0900)  . dexmedetomidine (PRECEDEX) IV infusion 0.5 mcg/kg/hr (06-14-19 0900)  . DOPamine 2.5 mcg/kg/min  (06/02/2019 2000)  . EPINEPHrine 4 mg in dextrose 5% 250 mL infusion (16 mcg/mL) Stopped (06/10/2019 2141)  . feeding supplement (VITAL 1.5 CAL)    . fentaNYL infusion INTRAVENOUS 200 mcg/hr (2019-06-14 0900)  . fluconazole (DIFLUCAN) IV Stopped (06/07/19 1400)  . insulin    . lactated ringers 20 mL/hr at 06/20/2019 2331  . lactated ringers 20 mL/hr at Jun 14, 2019 0900  . meropenem (MERREM) IV Stopped (06-14-19 0309)  . midazolam 6 mg/hr (June 14, 2019 0900)  . norepinephrine (LEVOPHED) Adult infusion 7.5 mcg/min (2019/06/14 0900)  . pantoprozole (PROTONIX) infusion 8 mg/hr (Jun 14, 2019 0800)  . phenylephrine (NEO-SYNEPHRINE) Adult infusion Stopped (06/22/2019 1734)  . prismasol BGK 4/2.5 1,800 mL/hr (06/14/2019 0758)  . vasopressin (PITRESSIN) infusion - *FOR SHOCK* Stopped (06/07/19 0503)    Scheduled Medications: . sodium chloride   Intravenous Once  . sodium chloride   Intravenous Once  . acetaminophen  1,000 mg Oral Q6H   Or  . acetaminophen (TYLENOL) oral liquid 160 mg/5 mL  1,000 mg Per Tube Q6H  . arformoterol  15 mcg Nebulization BID  . artificial tears  1 application Both Eyes [id number]  . atorvastatin  40 mg Per Tube q1800  . B-complex with  vitamin C  1 tablet Per Tube Daily  . bisacodyl  10 mg Oral Daily   Or  . bisacodyl  10 mg Rectal Daily  . chlorhexidine gluconate (MEDLINE KIT)  15 mL Mouth Rinse BID  . Chlorhexidine Gluconate Cloth  6 each Topical Daily  . docusate  200 mg Per Tube Daily  . guaiFENesin  30 mL Per Tube BID  . mouth rinse  15 mL Mouth Rinse 10 times per day  . mupirocin ointment   Nasal BID  . polyvinyl alcohol  1 drop Both Eyes QID  . sodium chloride flush  3 mL Intravenous Q12H  . umeclidinium bromide  2 puff Inhalation Daily  . vancomycin variable dose per unstable renal function (pharmacist dosing)   Does not apply See admin instructions    have reviewed scheduled and prn medications.  Physical Exam:  General: Intubated and sedated, critically ill Heart:RRR, s1s2  nl Lungs: Bibasal coarse breath sound, no wheezing Abdomen:soft, Non-tender, non-distended Extremities: LE edema + Dialysis Access: Left IJ catheter site clean.  Multiple line for ECMO.  Ryan B Sanford 2019/07/08,9:31 AM  LOS: 23 days

## 2019-06-25 NOTE — Progress Notes (Signed)
Called to the room per RN stating that the ventilator is continuously alarming. On my assessment noted that patient minute ventilation is significantly low compared to previous rounds/assessment. Patient trends of minute ventilation and tidal volumes were 6.0-8.0 and volumes of 420-500. Now noted minute ventilation to be 2.3-3.0. NO ETT displacement, leaks, or obstruction noted. BBS to auscultation reveals coarse crackles with coarse expiratory wheezing and airflow limitation noted throughout. PRN bronchodilator neb was given to facilitate promotion in lung aeration. Post neb, some improvement in BBS aeration was noted. Patient PCV was increase from 18-22. Attempted to notify MD Ogan to explain event and to get order to support this interventional change. Patient lungs is becoming less pulmonary compliant. Will attempt to notify MD again.  RRT Goal for the patient is establish and maintain good ventilation and oxygenation via utilization of mechanical ventilation and ECMO (which is being managed by ECMO specialist at bedside) using appropriate settings to meet systemic and myocardial oxygen demands to assure adequate tissue perfusion/oxygenation. Patient is currently stable at this time. RRT will continue to monitor patient clinical presentation, respiratory status, and all hemodynamics.   Breken Nazari [name], RRT, RCP

## 2019-06-25 NOTE — Progress Notes (Addendum)
Patient ID: Raymond Chavez, male   DOB: 04-16-1951, 68 y.o.   MRN: [id number]    Advanced Heart Failure Rounding Note   Subjective:    Events - Cath 10/23 with sever 3v CAD and cardiogenic shock. Impella CP placed - Underwent placement of Impella 5.0 on 10/24 with removal of R femoral Impella CP.  - Extubated 11/1, CVVH held on 11/3 given  - He had a possible aspiration event afternoon 11/3 then became progressively hypoxemic during the night.  He had to be re-intubated.  Bronchoscopy showed copious blood return suggesting hemoptysis. He developed PEA arrest during this time and underwent CPR  -11/4 initiate ECMO.  Patient went to OR, arterial cannula in ascending aorta and venous cannula in right femoral vein. Impella remains in place at P2.  - 11/7 ECMO circuit change for high dP - 11/9 Impella removed, LV vent placed.  Moderate AI noted on TEE after Impella pulled.  Yeast in BAL, started on fluconazole.  - 11/11 to OR for CABG x 4 + bioprosthetic AoV + LA clip and VA ECMO decannulation. Chest left open.  - 11/12 back to OR to re-initiate ECMO, cannulas in ascending aorta and RA.  - 11/13 Echo with LV/LA thrombus, started bivalirudin.  Clot removed around heart.   Remains intubated/sedated.  UF around 50 cc/hr by CVVH.  I/O -0865.  CVP 17, MAP 68 on norepinephrine 6.    He is on vancomycin/meropenem/fluconazole. Enterococcus faecalis, Candida albicans on BAL cultures.    Developed AF. Was on amiodarone, now off.  He is a-v sequentially paced.     Plts higher at 30K, hgb 7.7 has had PRBCs.  HIT pending.   AST 1296 => 1652, ALT 606 => 969, tbili 24.7. Lactate 1.2.   ECMO:  Flow 4.6 L/min 3200 rpm dP 26 Pven -46   Objective:   Weight Range:  Vital Signs:   Temp:  [97.9 F (36.6 C)-99.5 F (37.5 C)] 97.9 F (36.6 C) (11/14 0639) Pulse Rate:  [90] 90 (11/14 0639) Resp:  [0-25] 0 (11/14 0700) SpO2:  [62 %-100 %] 73 % (11/14 0030) Arterial Line BP: (55-255)/(41-247) 55/41  (11/14 0345) FiO2 (%):  [30 %] 30 % (11/14 0349) Weight:  [83.8 kg] 83.8 kg (11/14 0446) Last BM Date: 06/07/19  Weight change: Filed Weights   06/17/2019 0400 06/07/19 0530 Jun 26, 2019 0446  Weight: 72.4 kg 88.8 kg 83.8 kg    Intake/Output:   Intake/Output Summary (Last 24 hours) at 2019/06/26 0735 Last data filed at 2019/06/26 0700 Gross per 24 hour  Intake 4503.2 ml  Output 5907 ml  Net -1403.8 ml     Physical Exam: General: Intubated/sedated.  Neck: JVP elevated, no thyromegaly or thyroid nodule.  Lungs: Decreased at bases.  CV: Nondisplaced PMI. ECMO sounds.  2+ edema to knees and elbows. Abdomen: Soft, nontender, no hepatosplenomegaly, no distention.  Skin: Jaundiced.  Left hand mottling.  Neurologic: Sedated Extremities: No clubbing or cyanosis.  HEENT: Normal.   Telemetry: a-v sequentially (personally reviewed)  Labs: Basic Metabolic Panel: Recent Labs  Lab 06/01/19 1555  06/15/2019 0433  06/07/2019 0418  06/20/2019 1640 06/23/2019 1745  06/07/19 0421  06/07/19 1335  06/07/19 1754  06/07/19 2326 2019-06-26 0257 2019/06/26 0418 Jun 26, 2019 0616 06/26/2019 0706  NA 140   < > 138   < >  --    < > 145  --    < > 149*   < > 146*   < > 145   < > 144  142 143 143 143  K 4.8   < > 4.9   < >  --    < > 5.0  --    < > 4.4   < > 4.6   < > 4.7   < > 4.6 4.6 4.6 4.7 4.7  CL 107   < > 105   < >  --    < > 106  --   --  109  --  108  --  104  --   --  106  --   --   --   CO2 24   < > 23   < >  --    < > 26  --   --  25  --  25  --  24  --   --  24  --   --   --   GLUCOSE 176*   < > 104*   < >  --    < > 116*  --   --  94  --  87  --  94  --   --  77  --   --   --   BUN 30*   < > 45*   < >  --    < > 48*  --   --  65*  --  57*  --  53*  --   --  39*  --   --   --   CREATININE 1.26*   < > 1.29*   < >  --    < > 1.67*  --   --  1.96*  --  1.75*  --  1.64*  --   --  1.29*  --   --   --   CALCIUM 7.1*   < > 7.3*   < >  --    < > 8.2*  --   --  7.9*  --  7.6*  --  7.8*  --   --  7.6*  --   --    --   MG 2.4  --   --   --  3.3*  --  2.9*  --   --  2.8*  --   --   --   --   --   --  2.5*  --   --   --   PHOS  --    < > 2.7  --   --   --   --  7.1*  --  6.1*  --  5.2*  --   --   --   --  4.3  --   --   --    < > = values in this interval not displayed.    Liver Function Tests: Recent Labs  Lab 05/31/2019 0506 05/28/2019 1640 06/07/19 0421 06/07/19 1335 06/07/19 1754 Jun 17, 2019 0257  AST 116* 1,428* 1,296*  --  1,083* 1,652*  ALT 32 454* 606*  --  713* 969*  ALKPHOS 120 62 57  --  64 71  BILITOT 10.0* 13.9* 20.1*  --  22.1* 24.7*  PROT 5.2* 3.5* 3.5*  --  3.6* 4.2*  ALBUMIN 3.0* 2.2* 2.3* 2.3* 2.2* 2.6*   No results for input(s): LIPASE, AMYLASE in the last 168 hours. No results for input(s): AMMONIA in the last 168 hours.  CBC: Recent Labs  Lab 06/02/19 1150  06/16/2019 1811  06/18/2019 1640  06/07/19 0421  06/07/19 1754  06/07/19 2321  06/07/19 2326 June 11, 2019 0257 June 11, 2019 0418 11-Jun-2019 0616 2019-06-11 0706  WBC 14.9*   < > 17.6*   < > 7.7  --  7.9  --  9.2  --  10.2  --  10.1  --   --   --   NEUTROABS 13.6*  --  15.1*  --   --   --   --   --   --   --   --   --   --   --   --   --   HGB 8.1*   < > 13.6   < > 12.2*   < > 9.8*   < > 8.5*   < > 8.6* 7.5* 7.7* 6.5* 7.1* 7.8*  HCT 24.1*   < > 40.5   < > 34.8*   < > 27.9*   < > 24.3*   < > 24.3* 22.0* 22.0* 19.0* 21.0* 23.0*  MCV 93.1   < > 88.8   < > 84.7  --  83.5  --  85.6  --  85.3  --  86.3  --   --   --   PLT 32*   < > 79*   < > 43*  --  20*  --  14*  --  31*  --  31*  --   --   --    < > = values in this interval not displayed.    Cardiac Enzymes: No results for input(s): CKTOTAL, CKMB, CKMBINDEX, TROPONINI in the last 168 hours.  BNP: BNP (last 3 results) Recent Labs    05/08/2019 1739 04/30/2019 1234  BNP 2,725.3* 3,370.0*    ProBNP (last 3 results) No results for input(s): PROBNP in the last 8760 hours.    Other results:  Imaging: Dg Chest Port 1 View  Result Date: 11-Jun-2019 CLINICAL DATA:   Intubation.  Status post CABG. EXAM: PORTABLE CHEST 1 VIEW COMPARISON:  06/07/2019 FINDINGS: ETT tip in stable position above the carina. The nasogastric tube tip is above the GE junction. The side port is approximately 2 cm above the carina. There is a right internal jugular Swan-Ganz catheter with tip at the expected location of the right ventricular outflow tract. Left IJ catheter tip projects over the left brachiocephalic vein. ECMO device is stable. Left atrial clip and aortic valve prosthesis are noted. Heart size is normal. Diffuse bilateral interstitial and airspace opacities are unchanged from previous exam. There are bilateral chest tubes in place without pneumothorax. IMPRESSION: 1. Stable position of support apparatus. Note: The nasogastric tube may be under advanced. The tip appears to be above the level of the GE junction. 2. No change in aeration to the lungs compared with previous exam. Electronically Signed   By: Kerby Moors M.D.   On: 2019-06-11 04:14   Dg Chest Port 1 View  Result Date: 06/07/2019 CLINICAL DATA:  Intubation.  Chest tube. EXAM: PORTABLE CHEST 1 VIEW COMPARISON:  Left 06/2019. FINDINGS: Endotracheal tube, NG tube, left IJ line, Swan-Ganz catheter, mediastinal drainage tubes, bilateral chest tubes, ECMO device in stable position. No pneumothorax. Prior cardiac valve replacement. Left atrial appendage clip noted stable position. Heart size stable. Diffuse bilateral pulmonary infiltrates/edema again noted. No prominent pleural effusion. IMPRESSION: 1. Lines and tubes including bilateral chest tubes in stable position. No pneumothorax. 2. Prior cardiac valve replacement. Left atrial appendage clip in stable position. Heart size stable. 3. Diffuse bilateral pulmonary infiltrates/edema again noted without interim change. Electronically Signed   By:  [place]   On: 06/07/2019 07:29   Dg Chest Port 1 View  Result Date: 06/07/2019 CLINICAL DATA:  Hypotension post ECMO.   Recent CABG. EXAM: PORTABLE CHEST 1 VIEW COMPARISON:  Chest x-ray from same day. FINDINGS: Unchanged endotracheal and enteric tubes. Unchanged bilateral chest tubes and mediastinal drain. Unchanged right internal jugular Swan-Ganz catheter with tip in the main pulmonary artery. Unchanged right upper extremity PICC line and bilateral internal jugular central venous catheters. Unchanged ECMO catheters. Normal heart size. Prior AVR and left atrial appendage clipping. Hazy airspace disease both lungs, greater on the left, similar to prior study. No pleural effusion or pneumothorax. No acute osseous abnormality. IMPRESSION: 1. Stable support tubes and lines.  No pneumothorax. 2. Unchanged diffuse pulmonary edema. Electronically Signed   By: Titus Dubin M.D.   On: 06/09/2019 14:01   Dg Chest Port 1 View  Result Date: 05/28/2019 CLINICAL DATA:  Status post open heart surgery. EXAM: PORTABLE CHEST 1 VIEW COMPARISON:  Same day. FINDINGS: Stable position of endotracheal, nasogastric tubes, bilateral jugular catheters and right-sided PICC line. Stable bilateral chest tubes are noted without pneumothorax. Stable bilateral lung opacities are noted. Bony thorax is unremarkable. IMPRESSION: Stable support apparatus. Stable bilateral lung opacities. Stable bilateral chest tubes without pneumothorax. Electronically Signed   By: Marijo Conception M.D.   On: 05/27/2019 13:12     Medications:     Scheduled Medications:  sodium chloride   Intravenous Once   sodium chloride   Intravenous Once   acetaminophen  1,000 mg Oral Q6H   Or   acetaminophen (TYLENOL) oral liquid 160 mg/5 mL  1,000 mg Per Tube Q6H   arformoterol  15 mcg Nebulization BID   artificial tears  1 application Both Eyes X7W   atorvastatin  40 mg Per Tube q1800   B-complex with vitamin C  1 tablet Per Tube Daily   bisacodyl  10 mg Oral Daily   Or   bisacodyl  10 mg Rectal Daily   chlorhexidine gluconate (MEDLINE KIT)  15 mL Mouth Rinse  BID   Chlorhexidine Gluconate Cloth  6 each Topical Daily   docusate  200 mg Per Tube Daily   guaiFENesin  30 mL Per Tube BID   mouth rinse  15 mL Mouth Rinse 10 times per day   mupirocin ointment   Nasal BID   polyvinyl alcohol  1 drop Both Eyes QID   sodium chloride flush  3 mL Intravenous Q12H   umeclidinium bromide  2 puff Inhalation Daily   vancomycin variable dose per unstable renal function (pharmacist dosing)   Does not apply See admin instructions    Infusions:   prismasol BGK 4/2.5 400 mL/hr at 06/07/19 2136    prismasol BGK 4/2.5 300 mL/hr at 06-15-19 0245   sodium chloride     sodium chloride     sodium chloride     bivalirudin (ANGIOMAX) infusion 0.5 mg/mL (Non-ACS indications) 0.01 mg/kg/hr (06-15-19 0700)   cisatracurium (NIMBEX) infusion 2 mcg/kg/min (06-15-19 0700)   dexmedetomidine (PRECEDEX) IV infusion 0.5 mcg/kg/hr (2019/06/15 0700)   DOPamine 2.5 mcg/kg/min (05/27/2019 2000)   EPINEPHrine 4 mg in dextrose 5% 250 mL infusion (16 mcg/mL) Stopped (06/04/2019 2141)   feeding supplement (VITAL 1.5 CAL)     fentaNYL infusion INTRAVENOUS 200 mcg/hr (2019/06/15 0700)   fluconazole (DIFLUCAN) IV Stopped (06/07/19 1400)   insulin     lactated ringers 20 mL/hr at 05/26/2019 2331   lactated ringers 20 mL/hr at 06/15/19 0700  meropenem (MERREM) IV Stopped (07/01/2019 0309)   midazolam 6 mg/hr (07-01-2019 0700)   norepinephrine (LEVOPHED) Adult infusion 20 mcg/min (06/23/2019 1149)   pantoprozole (PROTONIX) infusion 8 mg/hr (06/07/19 2237)   phenylephrine (NEO-SYNEPHRINE) Adult infusion Stopped (06/22/2019 1734)   prismasol BGK 4/2.5 1,800 mL/hr at 2019-07-01 0504   vasopressin (PITRESSIN) infusion - *FOR SHOCK* Stopped (06/07/19 0503)    PRN Medications: sodium chloride, albuterol, docusate, influenza vaccine adjuvanted, metoprolol tartrate, midazolam, morphine injection, ondansetron (ZOFRAN) IV, oxyCODONE, pneumococcal 23 valent vaccine, sodium chloride,  sodium chloride flush, traMADol   Assessment/Plan:   1. Shock: Possible mixed cardiogenic/septic with fever and suspected PNA.  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  Echo with EF 20%, RV moderately HK.  Impella CP placed 10/23. Switched for Impella 5.0 on 10/24. No flow meter on Impella due to damage to sensor on insertion. Patient has LBBB.  PEA arrest 11/3, ECMO started 11/4. ECMO circuit changed due to high dP and rising LDH on 11/7.  On 11/9 with ongoing rise in LDH, Impella removed and LV vent placed.  11/11 ECMO decannulated and CABG-bioprosthetic AVR done, chest left open. With markedly low CO, ECMO re-initiated 11/12. This morning, he is on norepinephrine 6 with CVP 17 and MAP 68.  Lactate 1.2. Good ECMO flow.   - Will get limited echo to reassess LV thrombus.  - Continue CVVH aiming for gentle UF, 50 cc/hr for now.  2. CAD:  NSTEMI, hs-TnI 16,000. 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 Now s/p CABG x 4 on 11/11.  - continue ASA/statin. No b-blocker with shock 3. AKI: Baseline creatinine 1.3, now AKI likely due to ATN from shock and contrast (had CTA for PE at admission, only 25 cc contrast with cath).   - Continue CVVH. Management as above 4. Acute Respiratory Failure: Has had PNA with Enterococcus faecalis in BAL cultures. Now re-intubated.  He had PEA arrest with possible aspiration 11/3 and also had pulmonary hemorrhage.  Hemoptysis appears to have stopped. CXR still with diffuse infiltrates but improved. Hopefully mostly pulmonary edema but ?ARDS component.  - CVVH management as above. - Continue vanco/meropenem/fluconazole per CCM.  5. ID: Suspected PNA, CXR with diffuse infiltrates. Enterococcus faecalis in BAL cultures initially, now with Candida albicans. Afebrile.  - Continue meropenem, vancomycin, fluconazole.  6. LBBB: Unsure chronicity.  7. Anemia: Hemolysis, bone marrow failure with shock/liver failure.   Continues to get PRBCs, hgb 7.7 this morning.   8. Thrombocytopenia: HIT negative x 2.  Platelets likely low due to bone marrow failure in setting of shock, liver failure, sepsis. 31 K this morning.  Seen by hematology, no interventions likely to be helpful beyond correcting underlying problems.  9. PVCs/NSVT: Quiescent.  10. FEN: Cortrak, will need post-pyloric TFs.  11. PAF with post-termination pauses: a-paced today.  - He is no longer on amiodarone.  12. Elevated LFTs: Shock liver likely from period of low cardiac output prior to ECMO recannulation yesterday. Check LFTs daily. Now jaundiced with profoundly high bilirubin.  13. LV thrombus: HIT negative again but management stable with bivalirudin so will not change.  14. Involve palliative care for discussions with family.   Patient remains critically ill and tenuous. I think it is time to involve palliative care for discussions with family.   CRITICAL CARE Performed by: Loralie Champagne  Total critical care time: 40 minutes  Critical care time was exclusive of  separately billable procedures and treating other patients.  Critical care was necessary to treat or prevent imminent or life-threatening deterioration.  Critical care was time spent personally by me on the following activities: development of treatment plan with patient and/or surrogate as well as nursing, discussions with consultants, evaluation of patient's response to treatment, examination of patient, obtaining history from patient or surrogate, ordering and performing treatments and interventions, ordering and review of laboratory studies, ordering and review of radiographic studies, pulse oximetry and re-evaluation of patient's condition.  Loralie Champagne 07/06/2019 7:35 AM

## 2019-06-25 NOTE — Progress Notes (Signed)
Pharmacy Anti-Coagulation Consult Note:  Pharmacy Consult for Heparin to bivalirudin Indication for heparin: ECMO  No Known Allergies  Patient Measurements: Height: 5\' 7"  (170.2 cm)(measured x3) Weight: 184 lb 11.9 oz (83.8 kg) IBW/kg (Calculated) : 66.1  Vital Signs: Temp: 97.9 F (36.6 C) (11/14 0459) Temp Source: Other (Comment) (11/14 0000) Pulse Rate: 90 (11/14 0459)  Labs: Recent Labs    06/07/19 0421  06/07/19 1335  06/07/19 1754  06/07/19 2321 06/07/19 2326 05/30/2019 0257 06/16/2019 0418  HGB 9.8*   < >  --    < > 8.5*   < > 8.6* 7.5* 7.7* 6.5*  HCT 27.9*   < >  --    < > 24.3*   < > 24.3* 22.0* 22.0* 19.0*  PLT 20*  --   --   --  14*  --  31*  --  31*  --   APTT 46*  --  81*  --  84*  --  79*  --  73*  --   LABPROT 36.1*  --  57.8*  --  54.7*  --   --   --   --   --   INR 3.7*  --  6.8*  --  6.3*  --   --   --   --   --   CREATININE 1.96*  --  1.75*  --  1.64*  --   --   --   --   --    < > = values in this interval not displayed.    Estimated Creatinine Clearance: 44 mL/min (A) (by C-G formula based on SCr of 1.64 mg/dL (H)).  Assessment: 68 yo M admitted with shock and concern for ACS now s/p Impella CP placement in cath lab. Impella CP swapped out for 5.0 d/t hemolysis. Underwent VA ECMO with goal of CABG for revascularization. ECMO initiated 11/4 pm and pt was started on heparin. This has intermittently been held with bleeding, pt most recently on 1100 units/h systemic only after Impella pulled 11/9 with aPTTs ~50s.  Pt underwent CABG + AVR on 11/11 then began decompensating requiring re-cannulation of ECMO on 11/12. Pt has remained extremely thrombocytopenic, HIT panel previously negative, resent per MD on 11/12 (pending). Heparin was held most of 11/12 with concern pt was too unstable to survive and GOC discussion ongoing, now is more stable so care is continued.  Pt noted to have LV clot 11/13 am, likely due to LV vent clotting off. Pharmacy asked to resume  anticoagulation. Dr. [name], Prescott Gum, and [place] all discussed. With ongoing thrombocytopenia and concern for HIT, will continue on bivalirudin while HIT is pending. Discussed risks with bivalirudin related to poor kidney function and and inability to readily reverse DTI - team wishes to proceed. Will begin very slowly and all dose adjustments will need to be communicated via Dr. [name].   APTT remains slightly elevated at 73 sec continue the course as bivalirudin is infusing at a very low rate of 1.8 ml/hr.    Goal of Therapy:  Goal aPTT 50-70 seconds Monitor platelets by anticoagulation protocol: Yes   Plan:  Continue bivalirudin to 0.01 mg/kg/hr Check aPTT in 4hrs  Alanda Slim, PharmD, [facility] Clinical Pharmacist Please see AMION for all Pharmacists' Contact Phone Numbers 06/15/2019, 5:02 AM

## 2019-06-25 NOTE — Progress Notes (Signed)
[facility] Gastroenterology Progress Note  Jayron Maqueda 68 y.o. [birth year]   Subjective: Intubated, sedated. Nurses and bedside echo team present. No family in room.  Objective: Vital signs: Vitals:   05/30/2019 0645 06/13/2019 0700  BP:    Pulse:    Resp: (!) 0 (!) 0  Temp:    SpO2:    T 97.9, P 90   Physical Exam: Gen: intubated, sedated, multiple tubes in chest, dark liquid in rectal tube   CV: ECMO Chest: Coarse breath sounds Abd: nondistended, soft, +BS, nontender (no facial grimace) Ext: trace LE edema  Lab Results: Recent Labs    06/07/19 0421  06/07/19 1335  06/07/19 1754  06/07/2019 0257  06/01/2019 0616 06/19/2019 0706  NA 149*   < > 146*   < > 145   < > 142   < > 143 143  K 4.4   < > 4.6   < > 4.7   < > 4.6   < > 4.7 4.7  CL 109  --  108  --  104  --  106  --   --   --   CO2 25  --  25  --  24  --  24  --   --   --   GLUCOSE 94  --  87  --  94  --  77  --   --   --   BUN 65*  --  57*  --  53*  --  39*  --   --   --   CREATININE 1.96*  --  1.75*  --  1.64*  --  1.29*  --   --   --   CALCIUM 7.9*  --  7.6*  --  7.8*  --  7.6*  --   --   --   MG 2.8*  --   --   --   --   --  2.5*  --   --   --   PHOS 6.1*  --  5.2*  --   --   --  4.3  --   --   --    < > = values in this interval not displayed.   Recent Labs    06/07/19 1754 05/27/2019 0257  AST 1,083* 1,652*  ALT 713* 969*  ALKPHOS 64 71  BILITOT 22.1* 24.7*  PROT 3.6* 4.2*  ALBUMIN 2.2* 2.6*   Recent Labs    June 19, 2019 1811  06/07/19 2321  06/10/2019 0257  06/15/2019 0616 06/12/2019 0706  WBC 17.6*   < > 10.2  --  10.1  --   --   --   NEUTROABS 15.1*  --   --   --   --   --   --   --   HGB 13.6   < > 8.6*   < > 7.7*   < > 7.1* 7.8*  HCT 40.5   < > 24.3*   < > 22.0*   < > 21.0* 23.0*  MCV 88.8   < > 85.3  --  86.3  --   --   --   PLT 79*   < > 31*  --  31*  --   --   --    < > = values in this interval not displayed.      Assessment/Plan: Cardiogenic shock with shock liver and DIC (Dr. Marin Olp has seen).  Severe thrombocytopenia. Melena likely due to mucosal bleeding from coagulopathy. Doubt  a correctable peptic ulcer source and in my opinion he is too high risk to undergo an EGD that will not change his overall management. On Protonix drip. D/W Dr. Shirlee Latch and agree with palliative consult evaluation. No new GI recs.   Shirley Friar 06/02/2019, 8:53 AM  Questions please call [phone] ID: Raymond Chavez, male   DOB: Jun 10, 1951, 68 y.o.   MRN: [id number]

## 2019-06-25 NOTE — Progress Notes (Signed)
Pharmacy Anti-Coagulation Consult Note:  Pharmacy Consult for Heparin to bivalirudin Indication for heparin: ECMO  No Known Allergies  Patient Measurements: Height: 5\' 7"  (170.2 cm)(measured x3) Weight: 184 lb 11.9 oz (83.8 kg) IBW/kg (Calculated) : 66.1  Vital Signs: Temp: 97.9 F (36.6 C) (11/14 0639) Temp Source: Other (Comment) (11/14 0000) Pulse Rate: 90 (11/14 0639)  Labs: Recent Labs    06/07/19 1335  06/07/19 1754  06/07/19 2321  06/23/2019 0257 05/26/2019 0418 06/18/2019 0616 06/07/2019 0706 05/31/2019 1000  HGB  --    < > 8.5*   < > 8.6*   < > 7.7* 6.5* 7.1* 7.8*  --   HCT  --    < > 24.3*   < > 24.3*   < > 22.0* 19.0* 21.0* 23.0*  --   PLT  --   --  14*  --  31*  --  31*  --   --   --   --   APTT 81*  --  84*  --  79*  --  73*  --   --   --  72*  LABPROT 57.8*  --  54.7*  --   --   --  52.3*  --   --   --   --   INR 6.8*  --  6.3*  --   --   --  6.0*  --   --   --   --   CREATININE 1.75*  --  1.64*  --   --   --  1.29*  --   --   --   --    < > = values in this interval not displayed.    Estimated Creatinine Clearance: 56 mL/min (A) (by C-G formula based on SCr of 1.29 mg/dL (H)).  Assessment: 68 yo M admitted with shock and concern for ACS now s/p Impella CP placement in cath lab. Impella CP swapped out for 5.0 d/t hemolysis. Underwent VA ECMO with goal of CABG for revascularization. ECMO initiated 11/4 pm and pt was started on heparin. This has intermittently been held with bleeding, pt most recently on 1100 units/h systemic only after Impella pulled 11/9 with aPTTs ~50s.  Pt underwent CABG + AVR on 11/11 then began decompensating requiring re-cannulation of ECMO on 11/12. Pt has remained extremely thrombocytopenic, HIT panel previously negative, resent per MD on 11/12 (pending). Heparin was held most of 11/12 with concern pt was too unstable to survive and GOC discussion ongoing, now is more stable so care is continued.  Pt noted to have LV clot 11/13 am, likely due  to LV vent clotting off. Pharmacy asked to resume anticoagulation. Dr. [name], [place] Gum, and [place] all discussed. With ongoing thrombocytopenia and concern for HIT, will continue on bivalirudin while HIT is pending. Discussed risks with bivalirudin related to poor kidney function and and inability to readily reverse DTI - team wishes to proceed. Will begin very slowly and all dose adjustments will need to be communicated via Dr. [name].   APTT remains at 72 sec continue the course as bivalirudin is infusing at a very low rate of 1.8 ml/hr.    Goal of Therapy:  Goal aPTT 50-70 seconds Monitor platelets by anticoagulation protocol: Yes   Plan:  Continue bivalirudin 0.01 mg/kg/hr Adjust aPTT to q 12 hrs  Marguerite Olea, [facility] Clinical Pharmacist Phone [phone]  06/14/2019 10:51 AM

## 2019-06-25 NOTE — Progress Notes (Signed)
Family at bedside. Nitric & vent turned off by RN at 1334.

## 2019-06-25 NOTE — Progress Notes (Signed)
Helping unit RT- came to room to perform patient vent check.  Per RN- hold off on checking pt now d/t plans for withdrawal soon and family at bedside to be with pt.  Unit RT aware.

## 2019-06-25 NOTE — Consult Note (Signed)
Consultation Note Date: 25-Jun-2019   Patient Name: Raymond Chavez  DOB: Mar 06, 1951  MRN: [id number]  Age / Sex: 68 y.o., male  PCP: Raymond Chavez, Raymond Sa, MD Referring Physician: Wonda Olds, MD  Reason for Consultation: Establishing goals of care and Psychosocial/spiritual support  HPI/Patient Profile: 68 y.o. male  with past medical history of CAD and carotid disease who was admitted on 05/15/2019 with shortness of breath requiring intubation.  He was found to have had combined shock with acute hypoxic respiratory failure from pulmonary edema, pneumonia and NSTEMI.  He underwent cardiac cath on 10/23 and had severe 3 vessel disease with an EF of 15 - 20%.  An Impella LVAD was placed.   His kidneys failed and required CRRT.  He was able to be extubated on 11/1 but suffered PEA arrest on 11/3 and was reintubated with progressive shock.  ECMO was initiated on 11/4.  Over the course of the next week he developed signs of liver failure (significantly elevated LFTs and low platelets).  Clinical Assessment and Goals of Care:  I have reviewed medical records including EPIC notes, labs and imaging, received report from Dr. Orvan Chavez and ICU RN Raymond Chavez, assessed the patient and then met at the bedside along with his two children Raymond Chavez and Raymond Chavez  to discuss diagnosis prognosis, GOC, EOL wishes, disposition and options.  I introduced Palliative Medicine as specialized medical care for people living with serious illness. It focuses on providing relief from the symptoms and stress of a serious illness. The goal is to improve quality of life for both the patient and the family.  We discussed a brief life review of the patient. He was a Raymond Chavez, but mostly a Raymond Chavez who loved people and loved to have a good time.  He was divorced and never remarried.  He was very proud of his two children - Raymond Chavez who served  four active duty tours in the [employer] in [place] who was an ER and Quarry manager and is now the mother of his grand children.  As far as functional and nutritional status [name]'s illness was quite a shock to his family.  He was completely independent prior to admission and lived alone.  We discussed his current illness and what it means in the larger context of his on-going co-morbidities.  Natural disease trajectory and expectations at EOL were discussed. Raymond Chavez and Raymond Chavez had been very well informed by the medical team lead by Dr. Orvan Chavez thru out the course of their father's hospitalization.  They understood he was near EOL and were ready to allow him to be at peace.  We briefly discussed the process of compassionately allowing [name]'s body to follow nature's course and pass away.  I attempted to elicit values and goals of care important to the patient and family.  Raymond Chavez and Raymond Chavez wanted re-assurance that their father would not suffer.  I provided that re-assurance with a high level description of the process.  Dr. Orvan Chavez joined our meeting and highlighted where we are  from a medical perspective.  Again the family had been kept informed every step of the way so there were no surprises.  The family was in agreement with withdraw of life support and to allow a natural death.      Primary Decision Maker:  NEXT OF KIN    SUMMARY OF RECOMMENDATIONS    Orders placed for withdraw of life support and to allow natural death.  Code Status/Advance Care Planning:  DNR   Additional Recommendations (Limitations, Scope, Preferences):  Full Comfort Care  Palliative Prophylaxis:   Frequent Pain Assessment  Psycho-social/Spiritual:   Desire for further Chaplaincy support:  No thank you.  Prognosis:  Very short.      Discharge Planning: Anticipated Hospital Death      Primary Diagnoses: Present on Admission: . Acute hypoxemic respiratory failure ([place]) . Coronary artery disease    I have reviewed the medical record, interviewed the patient and family, and examined the patient. The following aspects are pertinent.  Past Medical History:  Diagnosis Date  . Acute respiratory failure with hypoxemia ([place]) 04/2019  . Carotid artery occlusion   . Hypertension    Social History   Socioeconomic History  . Marital status: Divorced    Spouse name: Not on file  . Number of children: 2  . Years of education: Not on file  . Highest education level: Not on file  Occupational History  . Not on file  Social Needs  . Financial resource strain: Not on file  . Food insecurity    Worry: Not on file    Inability: Not on file  . Transportation needs    Medical: Not on file    Non-medical: Not on file  Tobacco Use  . Smoking status: Light Tobacco Smoker    Packs/day: 0.00    Years: 40.00    Pack years: 0.00    Types: Cigarettes    Last attempt to quit: 10/23/2017    Years since quitting: 1.6  . Smokeless tobacco: Never Used  Substance and Sexual Activity  . Alcohol use: Yes    Alcohol/week: 6.0 - 12.0 standard drinks    Types: 6 - 12 Cans of beer per week  . Drug use: No  . Sexual activity: Yes  Lifestyle  . Physical activity    Days per week: Not on file    Minutes per session: Not on file  . Stress: Not on file  Relationships  . Social Herbalist on phone: Not on file    Gets together: Not on file    Attends religious service: Not on file    Active member of club or organization: Not on file    Attends meetings of clubs or organizations: Not on file    Relationship status: Not on file  Other Topics Concern  . Not on file  Social History Narrative  . Not on file   History reviewed. No pertinent family history. Scheduled Meds: . sodium chloride   Intravenous Once  . sodium chloride   Intravenous Once  . acetaminophen  1,000 mg Oral Q6H   Or  . acetaminophen (TYLENOL) oral liquid 160 mg/5 mL  1,000 mg Per Tube Q6H  . arformoterol  15 mcg  Nebulization BID  . artificial tears  1 application Both Eyes R9F  . chlorhexidine gluconate (MEDLINE KIT)  15 mL Mouth Rinse BID  . Chlorhexidine Gluconate Cloth  6 each Topical Daily  . glycopyrrolate  0.6 mg Intravenous QID  .  polyvinyl alcohol  1 drop Both Eyes QID  . sodium chloride flush  3 mL Intravenous Q12H   Continuous Infusions: . sodium chloride    . sodium chloride    . sodium chloride    . fentaNYL infusion INTRAVENOUS 400 mcg/hr (07/06/2019 1304)  . midazolam 6 mg/hr (07/06/19 1100)  . propofol (DIPRIVAN) infusion 20 mcg/kg/min (2019/07/06 1301)   PRN Meds:.sodium chloride, albuterol, antiseptic oral rinse, fentaNYL, glycopyrrolate **OR** glycopyrrolate **OR** glycopyrrolate, haloperidol **OR** haloperidol **OR** haloperidol lactate, midazolam, morphine injection, ondansetron (ZOFRAN) IV, polyvinyl alcohol, propofol, sodium chloride, sodium chloride flush No Known Allergies Review of Systems patient is non-verbal  Physical Exam  Well developed male, intubated, sedated, on CRRT and VA ECMO. Sclera are icteric 2+ jaundice of the skin.  Vital Signs: BP (!) 0/0 Comment: MAP 104 a line   Pulse 90   Temp (!) 97.5 F (36.4 C) (Other (Comment)) Comment (Src): ECMO  Resp (!) 0   Ht '5\' 7"'  (1.702 m) Comment: measured x3  Wt 83.8 kg   SpO2 (!) 73%   BMI 28.94 kg/m  Pain Scale: CPOT   Pain Score: 0-No pain   SpO2: SpO2: (!) 73 % O2 Device:SpO2: (!) 73 % O2 Flow Rate: .O2 Flow Rate (L/min): 0 L/min  IO: Intake/output summary:   Intake/Output Summary (Last 24 hours) at 2019/07/06 1348 Last data filed at 07/06/19 1200 Gross per 24 hour  Intake 4661.71 ml  Output 6319 ml  Net -1657.29 ml    LBM: Last BM Date: (flexiseal in place) Baseline Weight: Weight: 72.6 kg Most recent weight: Weight: 83.8 kg     Palliative Assessment/Data: 10%     Time In: 11:30 Time Out: 12:45 Time Total: 75 min. Visit consisted of counseling and education dealing with the complex  and emotionally intense issues surrounding the need for palliative care and symptom management in the setting of serious and potentially life-threatening illness. Greater than 50%  of this time was spent counseling and coordinating care related to the above assessment and plan.  Signed by: Florentina Jenny, PA-C Palliative Medicine Pager: [phone]  Please contact Palliative Medicine Team phone at [phone] for questions and concerns.  For individual provider: See Shea Evans

## 2019-06-25 NOTE — Progress Notes (Signed)
PCCM Progress Note Seen in f/u for respiratory failure in setting of profound cardiac shock on VA ECMO.    S: labs/events reviewed.  Remains heavily sedated, ABGs alkalotic.  O: GEN: heavily sedated man on vent HEENT: ETT in place, minimal secretions, trachea midline CV: cannot auscultate heart sounds well due to overlying hardware, ext warm PULM: Scattered rhonci, worse air movement today, passive on vent GI: Soft, hypoactive BS EXT: diffuse anasarca NEURO: pupils pinpoint but reactive, no response to pain PSYCH: cannot assess SKIN: jaundiced, multiple drains/lines in place  ABG 7.54, Bicarb 30, sweep has been decreased CXR stable diffuse hazy opacities c/w edema vs. ARDS  Liver indices worsening BMP okay Plts 31 Continued slow drop in H/H LDH up INR 6 CBG 70-90   A: -Cardiac failure on VA ECMO s/p salvage CABG/AVR with hopes that residual cardiac shock is from stunning and volume overloaded status -Renal failure on CRRT -Respiratory failure on vent, worsening compliance noted today -MAHA related to ECMO and ABLA with ongoing transfusion needs  P: -Continue current vent settings, adjust sweeps and diasylate targeting pH 7.3-7.4 -Given issues with recruitment, would not reduce PEEP below 10 -Remainder of care per primary  Erskine Emery MD PCCM

## 2019-06-25 NOTE — Progress Notes (Signed)
Overall, I would guess that there is not much change with respect to the status of Raymond Chavez.  Again, his hematologic issues I think we will go hand-in-hand with his liver.  His bilirubin today is 24.7.  His LDH is over 3400.  His SGPT/SGOT are climbing.  His INR is 6.  Fibrinogen is okay.  He has a very very low reticulocyte count.  When corrected for his hemoglobin, the reticulocyte count is less than 1%.  His platelet count is 31,000.  I do not think he needs any type of replacement products right now.  Since the fibrinogen is not that bad, I do not think we have to give him cryoprecipitate.  He is still on the Angiomax.  Having Enterococcus is certainly not helping the situation.  It is amazing how complicated his care is and how wonderful the CCU staff is managing all of the issues.  Lattie Haw, MD  Darlyn Chamber 17:14

## 2019-06-25 NOTE — Progress Notes (Signed)
AM labs sent at 0257. This RN called lab @0415  and @0445 . Both times lab told this RN that they had AM labs coming in and that they were sorting through labs to result. This RN stressed the importance of the pt being on ECMO and needing results back as a high priority. Both times lab reported to give them 15-76mins to result the pt's AM labs.

## 2019-06-25 NOTE — Progress Notes (Addendum)
0120: Moderate air leak noted in all 4 chest tubes at beginning of shift. MD aware. At roughly 0120, air leak in all 4 chest tubes was no longer noted in the [place]. Suction was appropriate, and frothy clots are still present in chest tubes.   7824: After multiple heat packs applied to tubing & extensive tip/tilt & milking of tubing, large clots were evacuated from CT's & air leak/flow returned.

## 2019-06-25 NOTE — Progress Notes (Signed)
  Echocardiogram 2D Echocardiogram has been performed.  Technically difficult study due to poor windows.  Raymond Chavez Raymond Chavez 06/20/2019, 8:41 AM

## 2019-06-25 DEATH — deceased

## 2019-07-02 LAB — FUNGUS CULTURE WITH STAIN

## 2019-07-02 LAB — FUNGAL ORGANISM REFLEX

## 2019-07-02 LAB — FUNGUS CULTURE RESULT

## 2019-07-17 LAB — ACID FAST CULTURE WITH REFLEXED SENSITIVITIES (MYCOBACTERIA): Acid Fast Culture: NEGATIVE

## 2021-09-27 IMAGING — DX DG CHEST 1V PORT
2 series · 2 of 2 positions shown · non-contrast
Comparison: Same day.

CLINICAL DATA: Status post open heart surgery.

EXAM:
PORTABLE CHEST 1 VIEW

[chest ap (1 of 2)]
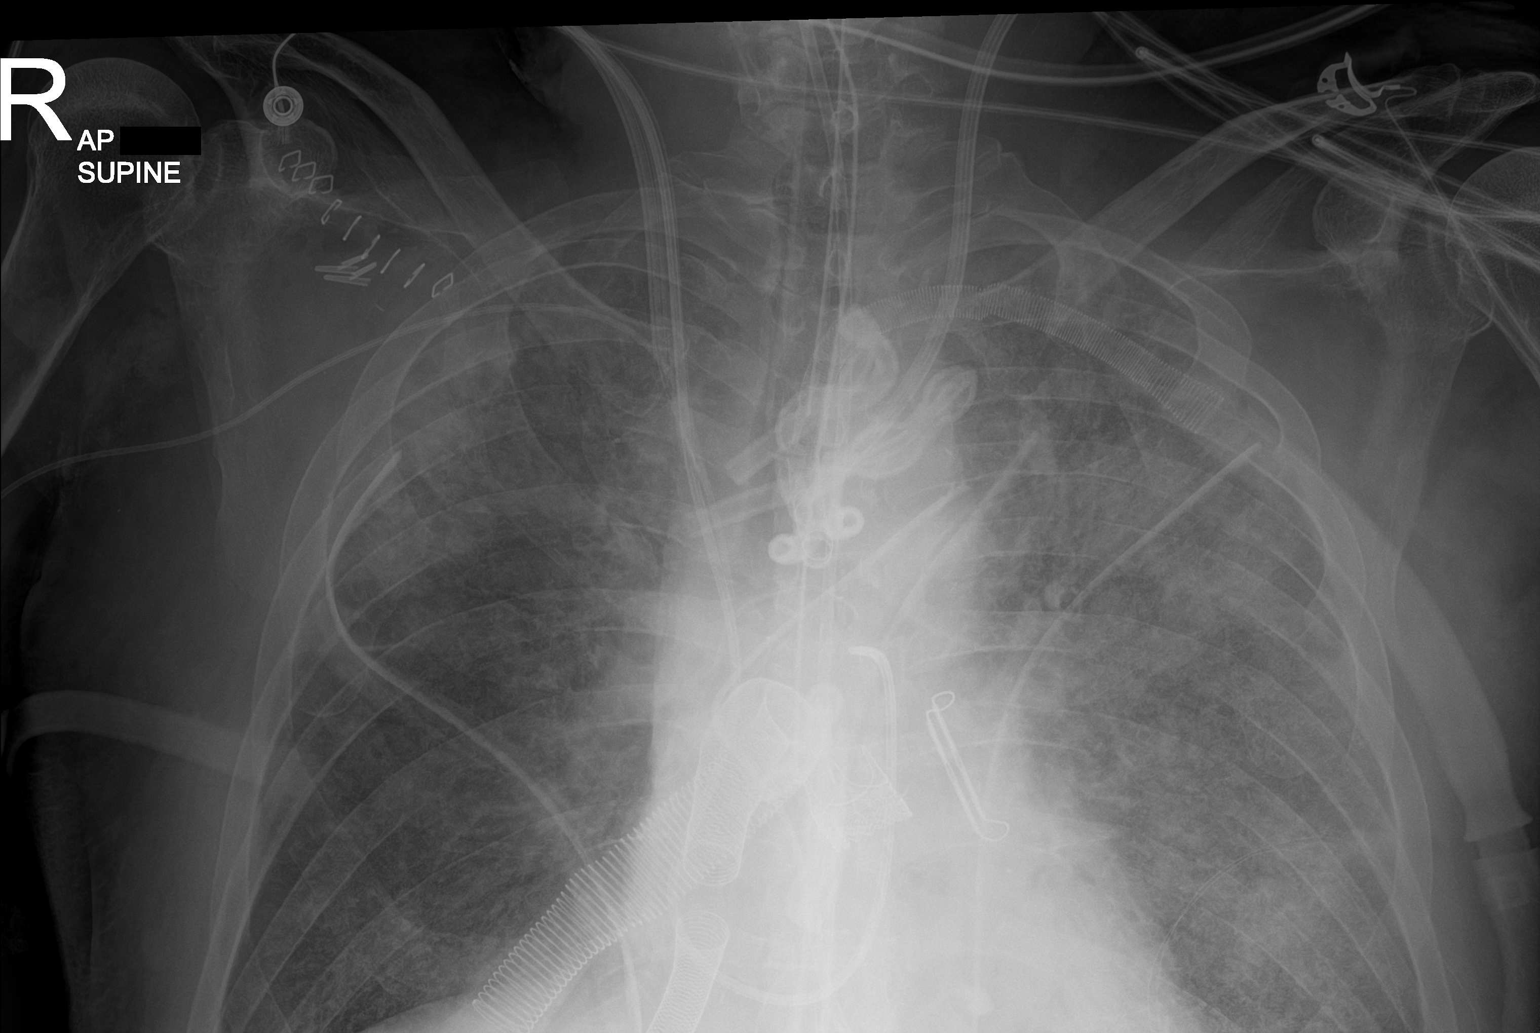

[chest ap (2 of 2)]
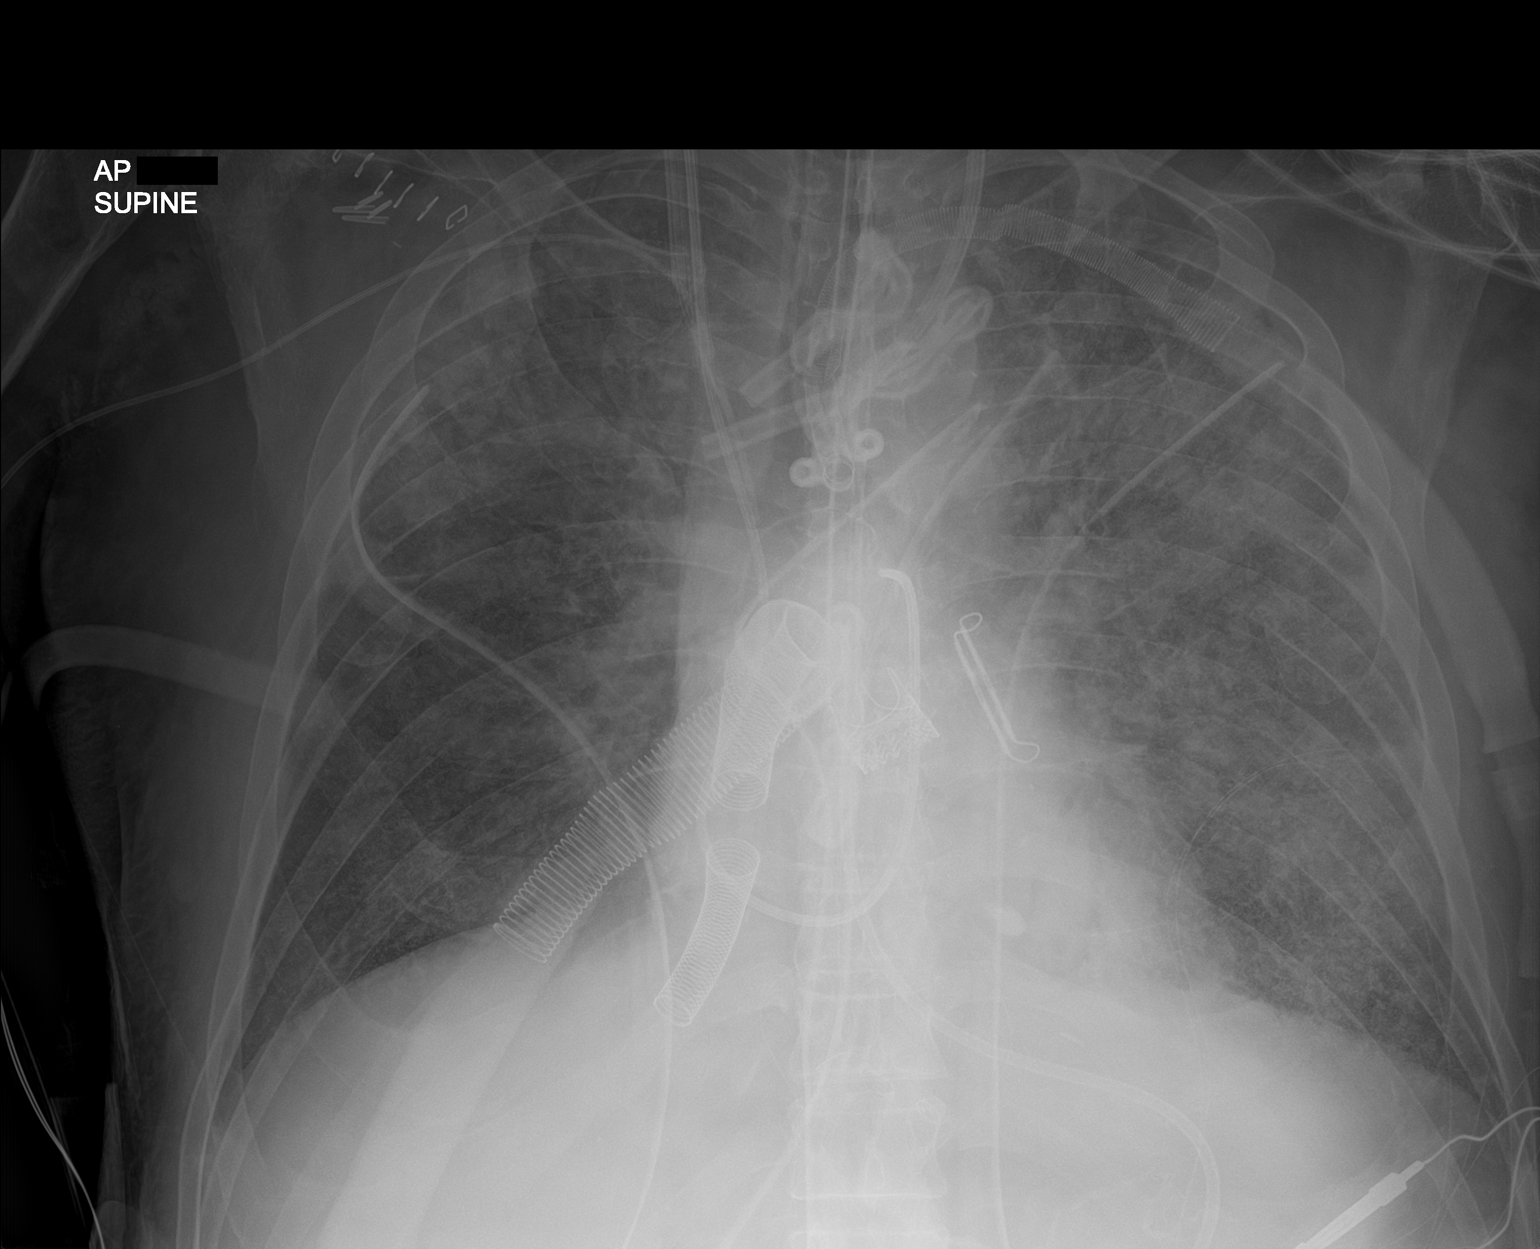

[2 of 2 positions shown; findings below may reference images not displayed]

FINDINGS: Stable position of endotracheal, nasogastric tubes, bilateral
jugular catheters and right-sided PICC line. Stable bilateral chest
tubes are noted without pneumothorax. Stable bilateral lung
opacities are noted. Bony thorax is unremarkable.
IMPRESSION: Stable support apparatus. Stable bilateral lung opacities. Stable
bilateral chest tubes without pneumothorax.
